# Patient Record
Sex: Female | Born: 1950 | Race: White | Hispanic: No | Marital: Married | Smoking: Former smoker
Health system: Southern US, Community
[De-identification: ages and names within clinical notes are randomized; demographics above are authoritative.]

## PROBLEM LIST (undated history)

## (undated) DIAGNOSIS — F419 Anxiety disorder, unspecified: Secondary | ICD-10-CM

## (undated) DIAGNOSIS — M1990 Unspecified osteoarthritis, unspecified site: Secondary | ICD-10-CM

## (undated) DIAGNOSIS — Z973 Presence of spectacles and contact lenses: Secondary | ICD-10-CM

## (undated) DIAGNOSIS — R0600 Dyspnea, unspecified: Secondary | ICD-10-CM

## (undated) DIAGNOSIS — I10 Essential (primary) hypertension: Secondary | ICD-10-CM

## (undated) DIAGNOSIS — K219 Gastro-esophageal reflux disease without esophagitis: Secondary | ICD-10-CM

## (undated) DIAGNOSIS — H409 Unspecified glaucoma: Secondary | ICD-10-CM

## (undated) DIAGNOSIS — J302 Other seasonal allergic rhinitis: Secondary | ICD-10-CM

## (undated) DIAGNOSIS — F329 Major depressive disorder, single episode, unspecified: Secondary | ICD-10-CM

## (undated) DIAGNOSIS — G4730 Sleep apnea, unspecified: Secondary | ICD-10-CM

## (undated) DIAGNOSIS — F32A Depression, unspecified: Secondary | ICD-10-CM

## (undated) DIAGNOSIS — J40 Bronchitis, not specified as acute or chronic: Secondary | ICD-10-CM

## (undated) DIAGNOSIS — R001 Bradycardia, unspecified: Secondary | ICD-10-CM

## (undated) DIAGNOSIS — I4891 Unspecified atrial fibrillation: Secondary | ICD-10-CM

## (undated) HISTORY — PX: COLONOSCOPY: SHX174

## (undated) HISTORY — PX: OTHER SURGICAL HISTORY: SHX169

## (undated) HISTORY — PX: BREAST BIOPSY: SHX20

## (undated) HISTORY — PX: TYMPANOSTOMY TUBE PLACEMENT: SHX32

---

## 1973-03-02 HISTORY — PX: NASAL SINUS SURGERY: SHX719

## 1988-03-02 HISTORY — PX: ABDOMINAL HYSTERECTOMY: SHX81

## 1998-07-02 ENCOUNTER — Other Ambulatory Visit: Admission: RE | Admit: 1998-07-02 | Discharge: 1998-07-02 | Payer: Self-pay | Admitting: Gynecology

## 1999-08-20 ENCOUNTER — Other Ambulatory Visit: Admission: RE | Admit: 1999-08-20 | Discharge: 1999-08-20 | Payer: Self-pay | Admitting: Gynecology

## 2000-09-21 ENCOUNTER — Other Ambulatory Visit: Admission: RE | Admit: 2000-09-21 | Discharge: 2000-09-21 | Payer: Self-pay | Admitting: Gynecology

## 2001-09-13 ENCOUNTER — Other Ambulatory Visit: Admission: RE | Admit: 2001-09-13 | Discharge: 2001-09-13 | Payer: Self-pay | Admitting: Gynecology

## 2002-09-25 ENCOUNTER — Other Ambulatory Visit: Admission: RE | Admit: 2002-09-25 | Discharge: 2002-09-25 | Payer: Self-pay | Admitting: Gynecology

## 2003-03-30 ENCOUNTER — Encounter: Admission: RE | Admit: 2003-03-30 | Discharge: 2003-03-30 | Payer: Self-pay | Admitting: Family Medicine

## 2003-10-10 ENCOUNTER — Other Ambulatory Visit: Admission: RE | Admit: 2003-10-10 | Discharge: 2003-10-10 | Payer: Self-pay | Admitting: Gynecology

## 2005-02-25 ENCOUNTER — Other Ambulatory Visit: Admission: RE | Admit: 2005-02-25 | Discharge: 2005-02-25 | Payer: Self-pay | Admitting: Gynecology

## 2006-03-01 ENCOUNTER — Other Ambulatory Visit: Admission: RE | Admit: 2006-03-01 | Discharge: 2006-03-01 | Payer: Self-pay | Admitting: Gynecology

## 2006-03-18 ENCOUNTER — Encounter: Admission: RE | Admit: 2006-03-18 | Discharge: 2006-03-18 | Payer: Self-pay | Admitting: Family Medicine

## 2007-02-21 ENCOUNTER — Other Ambulatory Visit: Admission: RE | Admit: 2007-02-21 | Discharge: 2007-02-21 | Payer: Self-pay | Admitting: Gynecology

## 2007-03-03 HISTORY — PX: ORIF ANKLE FRACTURE: SUR919

## 2007-05-20 ENCOUNTER — Inpatient Hospital Stay (HOSPITAL_COMMUNITY): Admission: EM | Admit: 2007-05-20 | Discharge: 2007-05-21 | Payer: Self-pay | Admitting: Emergency Medicine

## 2007-06-10 ENCOUNTER — Encounter: Admission: RE | Admit: 2007-06-10 | Discharge: 2007-06-10 | Payer: Self-pay | Admitting: Orthopedic Surgery

## 2007-06-23 ENCOUNTER — Encounter: Admission: RE | Admit: 2007-06-23 | Discharge: 2007-06-23 | Payer: Self-pay | Admitting: Orthopedic Surgery

## 2008-02-28 ENCOUNTER — Other Ambulatory Visit: Admission: RE | Admit: 2008-02-28 | Discharge: 2008-02-28 | Payer: Self-pay | Admitting: Gynecology

## 2010-07-15 NOTE — H&P (Signed)
NAME:  Michelle Johnston, Michelle Johnston NO.:  [id number]   MEDICAL RECORD NO.:  [id number]          PATIENT TYPE:  EMS   LOCATION:  ED                           FACILITY:  [facility]   PHYSICIAN:  Myrtie Neither, MD      DATE OF BIRTH:  08-22-1950   DATE OF ADMISSION:  05/20/2007  DATE OF DISCHARGE:                              HISTORY & PHYSICAL   CHIEF COMPLAINT:  Painful deformed left ankle.   HISTORY OF PRESENT ILLNESS:  This is a 60 year old female who was  painting on her front porch down on her knees and went to try to get up,  and her great toe got caught underneath an object causing her to fall.  The patient twisted the left ankle and developed severe pain and  deformity.  The patient came to [facility] Emergency Room for  treatment.  No history of any other injuries.   PAST MEDICAL HISTORY:  Glaucoma.  No history of high blood pressure or  diabetes.   ALLERGIES:  CEPHALOSPORINS.   MEDICATIONS:  1. Conjugated estrogens.  2. Betoptic.  3. Prozac.   FAMILY HISTORY:  Cancer and hypertension.   REVIEW OF SYSTEMS:  Basically, the patient has been in good health.   SOCIAL HISTORY:  No history of use of tobacco.  Occasional use of  alcohol   PHYSICAL EXAMINATION:  GENERAL:  Alert and oriented.  No acute distress.  VITAL SIGNS:  Temperature 97.8, pulse 63, respirations 16, blood  pressure 150/80.  HEENT:  Head is normocephalic.  Conjunctivae and sclerae are clear.  NECK:  Supple.  CHEST:  Clear.  CARDIAC:  S1 and S2 regular.  EXTREMITIES:  Left ankle grossly deformed.  Pulses, dorsalis pedis is  intact.  Sensory is intact.  Swollen both medially and laterally, and  tender.   DIAGNOSTICS:  X-ray revealed fracture/dislocation of the left ankle,  fractured lateral malleolus and avulsion of the deltoid, also posterior  lip fracture of the tibia as well.   IMPRESSION:  Fracture/dislocation of the left ankle with trimalleolar  fracture of the left ankle.   PLAN:  Open  reduction and internal fixation of the left ankle.      Myrtie Neither, MD  Electronically Signed     AC/MEDQ  D:  05/20/2007  T:  05/21/2007  Job:  [id number]

## 2010-07-15 NOTE — Op Note (Signed)
NAME:  GRISELLE, RUFER NO.:  [id number]   MEDICAL RECORD NO.:  [id number]          PATIENT TYPE:  INP   LOCATION:  0098                         FACILITY:  [facility]   PHYSICIAN:  Myrtie Neither, MD      DATE OF BIRTH:  11/15/1950   DATE OF PROCEDURE:  05/20/2007  DATE OF DISCHARGE:                               OPERATIVE REPORT   PREOPERATIVE DIAGNOSIS:  Fracture dislocation left ankle with fractured  lateral malleolus, avulsion on the medial malleolus and posterior  plafond fracture.   POSTOPERATIVE DIAGNOSIS:  Fracture dislocation left ankle with fractured  lateral malleolus, avulsion on the medial malleolus and posterior  plafond fracture, also loose intra-articular fragment.   ANESTHESIA:  General.   SURGEON:  Myrtie Neither, MD   PROCEDURE:  1. Open reduction and internal fixation lateral malleolus.  2. Removal of loose fragment and application of short leg cast.   PROCEDURE IN DETAIL:  The patient was taken to the operating room and  after given adequate preop medications given general anesthesia and  intubated.  The left ankle was gently manipulated and reduced.  The left  lower leg was then prepped with DuraPrep and draped in a sterile manner.  Tourniquet used for hemostasis.  Mini C-arm was used to visualize  fracture reduction.  Lateral incision made over the lateral malleolus  going through the skin and subcutaneous tissue.  Subperiosteal elevation  of soft tissue from the fracture site was done.  Inspection of the ankle  joint revealed loose fragment underneath the tibial plafond which was  too small to replace.  Loose fragment was removed.  Irrigation was done.  Manipulated reduction of the lateral malleolar fracture was done, placed  in anatomic position and then stabilized with a 6 hole compression plate  using 5 screws.  This was visualized with use of mini C-arm.  Posterior  plafond fragment was very minimal and medial malleolus was only an  avulsion.  Irrigation was done.  Wound closure was then done with 2-0  Vicryl and skin staples.  Compressive dressing was applied.  Short leg  fiberglass cast was applied.  The patient tolerated the procedure quite  well and went to the recovery room in stable and satisfactory condition.      Myrtie Neither, MD  Electronically Signed     AC/MEDQ  D:  05/20/2007  T:  05/21/2007  Job:  [id number]

## 2010-07-30 ENCOUNTER — Other Ambulatory Visit: Payer: Self-pay | Admitting: Orthopedic Surgery

## 2010-07-30 ENCOUNTER — Ambulatory Visit
Admission: RE | Admit: 2010-07-30 | Discharge: 2010-07-30 | Disposition: A | Discharge status: Home / Self Care | Payer: 59 | Source: Ambulatory Visit | Attending: Orthopedic Surgery | Admitting: Orthopedic Surgery

## 2010-07-30 DIAGNOSIS — M25562 Pain in left knee: Secondary | ICD-10-CM

## 2010-08-25 ENCOUNTER — Ambulatory Visit: Payer: 59

## 2010-09-02 ENCOUNTER — Ambulatory Visit: Payer: 59 | Attending: Orthopedic Surgery

## 2010-09-02 DIAGNOSIS — IMO0001 Reserved for inherently not codable concepts without codable children: Secondary | ICD-10-CM | POA: Insufficient documentation

## 2010-09-02 DIAGNOSIS — R5381 Other malaise: Secondary | ICD-10-CM | POA: Insufficient documentation

## 2010-09-02 DIAGNOSIS — M25569 Pain in unspecified knee: Secondary | ICD-10-CM | POA: Insufficient documentation

## 2010-09-04 ENCOUNTER — Ambulatory Visit: Payer: 59

## 2010-09-08 ENCOUNTER — Ambulatory Visit: Payer: 59

## 2010-09-17 ENCOUNTER — Encounter: Payer: 59 | Admitting: Physical Therapy

## 2010-09-24 ENCOUNTER — Encounter: Payer: 59 | Admitting: Physical Therapy

## 2010-09-29 ENCOUNTER — Encounter: Payer: 59 | Admitting: Physical Therapy

## 2010-10-01 ENCOUNTER — Encounter: Payer: 59 | Admitting: Physical Therapy

## 2010-11-24 LAB — SAMPLE TO BLOOD BANK

## 2010-11-24 LAB — BASIC METABOLIC PANEL
BUN: 19
CO2: 24
Chloride: 96
Glucose, Bld: 97
Potassium: 3.4 — ABNORMAL LOW
Sodium: 130 — ABNORMAL LOW

## 2010-11-24 LAB — DIFFERENTIAL
Basophils Absolute: 0
Basophils Relative: 0
Eosinophils Absolute: 0
Eosinophils Relative: 1
Lymphocytes Relative: 24
Monocytes Absolute: 0.3

## 2010-11-24 LAB — PROTIME-INR: Prothrombin Time: 13

## 2010-11-24 LAB — CBC
HCT: 41
Hemoglobin: 14.3
MCHC: 34.9
MCV: 96.5
Platelets: 228
RDW: 13.1

## 2011-12-14 ENCOUNTER — Emergency Department (HOSPITAL_COMMUNITY)
Admission: EM | Admit: 2011-12-14 | Discharge: 2011-12-15 | Disposition: A | Discharge status: Home / Self Care | Payer: Managed Care, Other (non HMO) | Attending: Emergency Medicine | Admitting: Emergency Medicine

## 2011-12-14 ENCOUNTER — Encounter (HOSPITAL_COMMUNITY): Payer: Self-pay | Admitting: Emergency Medicine

## 2011-12-14 ENCOUNTER — Emergency Department (HOSPITAL_COMMUNITY): Payer: Managed Care, Other (non HMO)

## 2011-12-14 DIAGNOSIS — R109 Unspecified abdominal pain: Secondary | ICD-10-CM | POA: Insufficient documentation

## 2011-12-14 DIAGNOSIS — Z90710 Acquired absence of both cervix and uterus: Secondary | ICD-10-CM | POA: Insufficient documentation

## 2011-12-14 DIAGNOSIS — R10815 Periumbilic abdominal tenderness: Secondary | ICD-10-CM | POA: Insufficient documentation

## 2011-12-14 DIAGNOSIS — R110 Nausea: Secondary | ICD-10-CM | POA: Insufficient documentation

## 2011-12-14 HISTORY — DX: Depression, unspecified: F32.A

## 2011-12-14 HISTORY — DX: Major depressive disorder, single episode, unspecified: F32.9

## 2011-12-14 LAB — CBC WITH DIFFERENTIAL/PLATELET
Eosinophils Absolute: 0 10*3/uL (ref 0.0–0.7)
HCT: 36.8 % (ref 36.0–46.0)
Hemoglobin: 13.1 g/dL (ref 12.0–15.0)
Lymphs Abs: 1.1 10*3/uL (ref 0.7–4.0)
MCH: 33.9 pg (ref 26.0–34.0)
Monocytes Absolute: 0.4 10*3/uL (ref 0.1–1.0)
Monocytes Relative: 5 % (ref 3–12)
Neutrophils Relative %: 80 % — ABNORMAL HIGH (ref 43–77)
RBC: 3.87 MIL/uL (ref 3.87–5.11)

## 2011-12-14 LAB — COMPREHENSIVE METABOLIC PANEL
Alkaline Phosphatase: 52 U/L (ref 39–117)
BUN: 13 mg/dL (ref 6–23)
Chloride: 97 mEq/L (ref 96–112)
Creatinine, Ser: 0.58 mg/dL (ref 0.50–1.10)
GFR calc Af Amer: >90 mL/min (ref >90)
Glucose, Bld: 106 mg/dL — ABNORMAL HIGH (ref 70–99)
Potassium: 3.8 mEq/L (ref 3.5–5.1)
Total Bilirubin: 0.4 mg/dL (ref 0.3–1.2)
Total Protein: 6.7 g/dL (ref 6.0–8.3)

## 2011-12-14 LAB — URINALYSIS, MICROSCOPIC ONLY
Bilirubin Urine: NEGATIVE
Nitrite: NEGATIVE
Urobilinogen, UA: 0.2 mg/dL (ref 0.0–1.0)

## 2011-12-14 LAB — LIPASE, BLOOD: Lipase: 14 U/L (ref 11–59)

## 2011-12-14 MED ORDER — HYDROMORPHONE HCL PF 1 MG/ML IJ SOLN
1.0000 mg | Freq: Once | INTRAMUSCULAR | Status: AC
  Administered 2011-12-14: 1 mg via INTRAVENOUS
  Filled 2011-12-14: qty 1

## 2011-12-14 MED ORDER — CIPROFLOXACIN IN D5W 400 MG/200ML IV SOLN
400.0000 mg | Freq: Once | INTRAVENOUS | Status: AC
  Administered 2011-12-14: 400 mg via INTRAVENOUS
  Filled 2011-12-14: qty 200

## 2011-12-14 MED ORDER — OXYCODONE-ACETAMINOPHEN 5-325 MG PO TABS: 1.0000 | ORAL_TABLET | Freq: Four times a day (QID) | ORAL | Status: DC | PRN

## 2011-12-14 MED ORDER — ONDANSETRON HCL 4 MG/2ML IJ SOLN: 4.0000 mg | Freq: Once | INTRAMUSCULAR | Status: DC

## 2011-12-14 MED ORDER — ONDANSETRON HCL 4 MG PO TABS: 4.0000 mg | ORAL_TABLET | Freq: Four times a day (QID) | ORAL | Status: DC

## 2011-12-14 MED ORDER — FENTANYL CITRATE 0.05 MG/ML IJ SOLN
50.0000 ug | Freq: Once | INTRAMUSCULAR | Status: AC
  Administered 2011-12-14: 50 ug via INTRAVENOUS
  Filled 2011-12-14: qty 2

## 2011-12-14 MED ORDER — SODIUM CHLORIDE 0.9 % IV SOLN
1000.0000 mL | Freq: Once | INTRAVENOUS | Status: AC
  Administered 2011-12-14: 1000 mL via INTRAVENOUS

## 2011-12-14 MED ORDER — METRONIDAZOLE 500 MG PO TABS: 500.0000 mg | ORAL_TABLET | Freq: Two times a day (BID) | ORAL | Status: DC

## 2011-12-14 MED ORDER — IOHEXOL 300 MG/ML  SOLN
100.0000 mL | Freq: Once | INTRAMUSCULAR | Status: AC | PRN
  Administered 2011-12-14: 100 mL via INTRAVENOUS

## 2011-12-14 MED ORDER — ONDANSETRON HCL 4 MG/2ML IJ SOLN
4.0000 mg | Freq: Once | INTRAMUSCULAR | Status: AC
  Administered 2011-12-14: 4 mg via INTRAVENOUS
  Filled 2011-12-14: qty 2

## 2011-12-14 MED ORDER — CIPROFLOXACIN HCL 500 MG PO TABS
500.0000 mg | ORAL_TABLET | Freq: Two times a day (BID) | ORAL | Status: DC
Start: 2011-12-14 — End: 2011-12-16

## 2011-12-14 MED ORDER — ONDANSETRON HCL 4 MG/2ML IJ SOLN
INTRAMUSCULAR | Status: AC
  Filled 2011-12-14: qty 4

## 2011-12-14 MED ORDER — METRONIDAZOLE IN NACL 5-0.79 MG/ML-% IV SOLN
500.0000 mg | Freq: Once | INTRAVENOUS | Status: AC
  Administered 2011-12-14: 500 mg via INTRAVENOUS
  Filled 2011-12-14: qty 100

## 2011-12-14 NOTE — ED Notes (Signed)
Pt received zofran 8mg  via ems. Ems given back 8mg  of zofran

## 2011-12-14 NOTE — ED Provider Notes (Signed)
History     CSN: [id number]  Arrival date & time 12/14/11  4098   First MD Initiated Contact with Patient 12/14/11 0857      Chief Complaint  Patient presents with  . Abdominal Pain  . Nausea    (Consider location/radiation/quality/duration/timing/severity/associated sxs/prior treatment) HPI Pt presents with c/o abdominal pain as well as nausea, no vomiting.  Pain began yesterday and is located in her mid abdomen and upper abdomen.  No diarrhea.  No fever/chills.  Pain is moderate.  Denies dysuria/frequency/urgency.  She has not had any treatment prior to arrival.  There are no other associated systemic symptoms, there are no other alleviating or modifying factors.   Past Medical History  Diagnosis Date  . Depression   . Bradycardia   . Glaucoma     Past Surgical History  Procedure Date  . Abdominal hysterectomy   . Nasal sinus surgery     No family history on file.  History  Substance Use Topics  . Smoking status: Never Smoker   . Smokeless tobacco: Not on file  . Alcohol Use: Yes     daily    OB History    Grav Para Term Preterm Abortions TAB SAB Ect Mult Living                  Review of Systems ROS reviewed and all otherwise negative except for mentioned in HPI  Allergies  Ceclor  Home Medications   Current Outpatient Rx  Name Route Sig Dispense Refill  . ACETAMINOPHEN 500 MG PO TABS Oral Take 1,000 mg by mouth every 6 (six) hours as needed. pain    . ESTRADIOL 0.075 MG/24HR TD PTTW Transdermal Place 1 patch onto the skin 2 (two) times a week.    Marland Kitchen FLUOXETINE HCL 40 MG PO CAPS Oral Take 40 mg by mouth daily.    Marland Kitchen TIMOLOL HEMIHYDRATE 0.25 % OP SOLN Both Eyes Place 1-2 drops into both eyes 2 (two) times daily.    Marland Kitchen CIPROFLOXACIN HCL 500 MG PO TABS Oral Take 500 mg by mouth every 12 (twelve) hours.    Marland Kitchen METRONIDAZOLE 500 MG PO TABS Oral Take 500 mg by mouth 2 (two) times daily.    Marland Kitchen ONDANSETRON HCL 4 MG PO TABS Oral Take 1 tablet (4 mg total) by mouth  every 6 (six) hours. 12 tablet 0  . OXYCODONE-ACETAMINOPHEN 5-325 MG PO TABS Oral Take 1-2 tablets by mouth every 6 (six) hours as needed for pain. 15 tablet 0    BP 118/61  Pulse 59  Temp 98.3 F (36.8 C) (Oral)  Resp 16  SpO2 100% Vitals reviewed Physical Exam Physical Examination: General appearance - alert, well appearing, and in no distress Mental status - alert, oriented to person, place, and time Eyes - no scleral icterus, no conjunctival injection Mouth - mucous membranes moist, pharynx normal without lesions Chest - clear to auscultation, no wheezes, rales or rhonchi, symmetric air entry Heart - normal rate, regular rhythm, normal S1, S2, no murmurs, rubs, clicks or gallops Abdomen - soft, mild ttp in periumbilical region, no gaurding or rebound tenderness, nondistended, no masses or organomegaly, nabs Extremities - peripheral pulses normal, no pedal edema, no clubbing or cyanosis Skin - normal coloration and turgor, no rashes, brisk cap refill  ED Course  Procedures (including critical care time)  Labs Reviewed  CBC WITH DIFFERENTIAL - Abnormal; Notable for the following:    Neutrophils Relative 80 (*)     All other  components within normal limits  COMPREHENSIVE METABOLIC PANEL - Abnormal; Notable for the following:    Sodium 133 (*)     Glucose, Bld 106 (*)     All other components within normal limits  URINALYSIS, MICROSCOPIC ONLY - Abnormal; Notable for the following:    Ketones, ur TRACE (*)     Bacteria, UA FEW (*)     Squamous Epithelial / LPF FEW (*)     All other components within normal limits  LIPASE, BLOOD  LAB REPORT - SCANNED   No results found.   1. Abdominal pain       MDM  Pt presenting with abdominal pain and nausea. Workup including labs, and ultrasound are reassuring with the exception of mild hyponatremia, pt treated with IV hydration, IV pain and nausea meds.  Ultrasound was reassuring, but pain continued, therefore CT scan obtained.   This showed mild wall thickening of a single loop of bowel ? Ileitis.  Pt started on iv cipro and flagyl.  Tolerated po challenge prior to discharge.  Discharged with strict return precautions.  Pt agreeable with plan.        Ethelda Chick, MD 12/16/11 [id number]

## 2011-12-14 NOTE — ED Notes (Signed)
MWU:[id number]<MW> Expected date:<BR> Expected time:<BR> Means of arrival:<BR> Comments:<BR> Abdominal pain/fever

## 2011-12-14 NOTE — ED Notes (Signed)
Pt presenting to ed with c/o abdominal pain with positive nausea no vomiting no diarrhea

## 2011-12-16 ENCOUNTER — Emergency Department (HOSPITAL_COMMUNITY)
Admission: EM | Admit: 2011-12-16 | Discharge: 2011-12-16 | Disposition: A | Discharge status: Home / Self Care | Payer: Managed Care, Other (non HMO) | Attending: Emergency Medicine | Admitting: Emergency Medicine

## 2011-12-16 ENCOUNTER — Encounter (HOSPITAL_COMMUNITY): Payer: Self-pay

## 2011-12-16 DIAGNOSIS — B9689 Other specified bacterial agents as the cause of diseases classified elsewhere: Secondary | ICD-10-CM | POA: Insufficient documentation

## 2011-12-16 DIAGNOSIS — K5289 Other specified noninfective gastroenteritis and colitis: Secondary | ICD-10-CM | POA: Insufficient documentation

## 2011-12-16 DIAGNOSIS — A499 Bacterial infection, unspecified: Secondary | ICD-10-CM | POA: Insufficient documentation

## 2011-12-16 DIAGNOSIS — M542 Cervicalgia: Secondary | ICD-10-CM | POA: Insufficient documentation

## 2011-12-16 DIAGNOSIS — N390 Urinary tract infection, site not specified: Secondary | ICD-10-CM | POA: Insufficient documentation

## 2011-12-16 DIAGNOSIS — H409 Unspecified glaucoma: Secondary | ICD-10-CM | POA: Insufficient documentation

## 2011-12-16 DIAGNOSIS — Z79899 Other long term (current) drug therapy: Secondary | ICD-10-CM | POA: Insufficient documentation

## 2011-12-16 DIAGNOSIS — N760 Acute vaginitis: Secondary | ICD-10-CM | POA: Insufficient documentation

## 2011-12-16 HISTORY — DX: Unspecified glaucoma: H40.9

## 2011-12-16 HISTORY — DX: Bradycardia, unspecified: R00.1

## 2011-12-16 LAB — CBC WITH DIFFERENTIAL/PLATELET
Eosinophils Relative: 2 % (ref 0–5)
HCT: 35.4 % — ABNORMAL LOW (ref 36.0–46.0)
Lymphocytes Relative: 29 % (ref 12–46)
Lymphs Abs: 1.2 10*3/uL (ref 0.7–4.0)
MCH: 34.1 pg — ABNORMAL HIGH (ref 26.0–34.0)
MCV: 97.3 fL (ref 78.0–100.0)
Monocytes Absolute: 0.4 10*3/uL (ref 0.1–1.0)
RBC: 3.64 MIL/uL — ABNORMAL LOW (ref 3.87–5.11)
RDW: 11.9 % (ref 11.5–15.5)
WBC: 4.1 10*3/uL (ref 4.0–10.5)

## 2011-12-16 LAB — URINALYSIS, ROUTINE W REFLEX MICROSCOPIC
Leukocytes, UA: NEGATIVE
Protein, ur: NEGATIVE mg/dL
Urobilinogen, UA: 0.2 mg/dL (ref 0.0–1.0)

## 2011-12-16 LAB — WET PREP, GENITAL: WBC, Wet Prep HPF POC: NONE SEEN

## 2011-12-16 LAB — LIPASE, BLOOD: Lipase: 13 U/L (ref 11–59)

## 2011-12-16 LAB — COMPREHENSIVE METABOLIC PANEL
BUN: 9 mg/dL (ref 6–23)
CO2: 26 mEq/L (ref 19–32)
Calcium: 8.7 mg/dL (ref 8.4–10.5)
Creatinine, Ser: 0.69 mg/dL (ref 0.50–1.10)
GFR calc Af Amer: >90 mL/min (ref >90)
GFR calc non Af Amer: >90 mL/min (ref >90)
Glucose, Bld: 95 mg/dL (ref 70–99)

## 2011-12-16 LAB — URINE MICROSCOPIC-ADD ON

## 2011-12-16 MED ORDER — SODIUM CHLORIDE 0.9 % IV BOLUS (SEPSIS)
1000.0000 mL | Freq: Once | INTRAVENOUS | Status: AC
  Administered 2011-12-16: 1000 mL via INTRAVENOUS

## 2011-12-16 MED ORDER — MORPHINE SULFATE 4 MG/ML IJ SOLN
4.0000 mg | Freq: Once | INTRAMUSCULAR | Status: AC
  Administered 2011-12-16: 4 mg via INTRAVENOUS
  Filled 2011-12-16: qty 1

## 2011-12-16 NOTE — ED Notes (Signed)
Pt states that she is having mid abdominal pain 5/10 since Monday morning.  Came here on Monday and was given abx for an "inflamed bowel".  Pt states she is pooping out of her vagina.  When asked how she knows that it is from her vagina, she stated "because I peed".  Pt says that brown/yellow diarrhea is coming out of her vagina.

## 2011-12-16 NOTE — ED Provider Notes (Signed)
Medical screening examination/treatment/procedure(s) were conducted as a shared visit with non-physician practitioner(s) and myself.  I personally evaluated the patient during the encounter  Michelle Johnston is a 61 y.o. female here with abdominal pain and stool in her vagina. She had diarrhea in AM and noticed stool around the vaginal area when she wiped back to front. No recent GYN or rectal surgery. Her labs are nl, I performed a GYN exam and saw no rectal vaginal fistula. She has a mild UTI probably from wiping incorrectly. I recommend wiping back to front and continue cipro and flagyl for the ileitis (which will treat UTI and possible BV on vaginal exam).    Richardean Canal, MD 12/16/11 1240

## 2011-12-16 NOTE — ED Provider Notes (Signed)
History     CSN: [id number]  Arrival date & time 12/16/11  4098   First MD Initiated Contact with Patient 12/16/11 1002      Chief Complaint  Patient presents with  . Abdominal Pain    seen here for Monday for presenting complaint    (Consider location/radiation/quality/duration/timing/severity/associated sxs/prior treatment) HPI Comments: This is a 61 year old female, who presents to the ED with a chief complaint of abdominal pain and "pooping out of my vagina."  The patient states that she has had abdominal pain for the past 2 days, and that she was seen here on 12/14/11 and diagnosed with ilieitis.  She was discharged on cipro/flagyl, which she has been taking regularly.  She states that the pain has not let up, and that last night she noticed that her urine was dark.  This morning she awoke to having fecal content in her underwear.  She reports that when she went to bathroom this morning, she had "poop" coming from her vagina.  Patient had TAH in 1999 for uterine fibroids and bleeding.  Patient reports no complications.  The history is provided by the patient. No language interpreter was used.    Past Medical History  Diagnosis Date  . Depression   . Bradycardia   . Glaucoma     Past Surgical History  Procedure Date  . Abdominal hysterectomy   . Nasal sinus surgery     No family history on file.  History  Substance Use Topics  . Smoking status: Never Smoker   . Smokeless tobacco: Not on file  . Alcohol Use: Yes     daily    OB History    Grav Para Term Preterm Abortions TAB SAB Ect Mult Living                  Review of Systems  Constitutional: Positive for chills. Negative for fever.  HENT: Positive for neck pain.   Eyes: Negative for visual disturbance.  Respiratory: Negative for chest tightness and shortness of breath.   Cardiovascular: Negative for chest pain.  Gastrointestinal: Positive for abdominal pain.       Fecal material from vagina    Genitourinary: Negative for dysuria and vaginal discharge.  Musculoskeletal: Negative for back pain.  Skin: Negative for rash.  Neurological: Negative for weakness.  Psychiatric/Behavioral: The patient is not nervous/anxious.   All other systems reviewed and are negative.    Allergies  Ceclor  Home Medications   Current Outpatient Rx  Name Route Sig Dispense Refill  . ACETAMINOPHEN 500 MG PO TABS Oral Take 1,000 mg by mouth every 6 (six) hours as needed. pain    . CIPROFLOXACIN HCL 500 MG PO TABS Oral Take 500 mg by mouth every 12 (twelve) hours.    . ESTRADIOL 0.075 MG/24HR TD PTTW Transdermal Place 1 patch onto the skin 2 (two) times a week.    Marland Kitchen FLUOXETINE HCL 40 MG PO CAPS Oral Take 40 mg by mouth daily.    Marland Kitchen ONDANSETRON HCL 4 MG PO TABS Oral Take 1 tablet (4 mg total) by mouth every 6 (six) hours. 12 tablet 0  . OXYCODONE-ACETAMINOPHEN 5-325 MG PO TABS Oral Take 1-2 tablets by mouth every 6 (six) hours as needed for pain. 15 tablet 0  . TIMOLOL HEMIHYDRATE 0.25 % OP SOLN Both Eyes Place 1-2 drops into both eyes 2 (two) times daily.    Marland Kitchen METRONIDAZOLE 500 MG PO TABS Oral Take 500 mg by mouth 2 (  two) times daily.      BP 142/88  Pulse 58  Temp 97.9 F (36.6 C) (Oral)  Resp 18  Ht 5\' 5"  (1.651 m)  Wt 160 lb (72.576 kg)  BMI 26.63 kg/m2  SpO2 100%  Physical Exam  Nursing note and vitals reviewed. Constitutional: She is oriented to person, place, and time. She appears well-developed and well-nourished.  HENT:  Head: Normocephalic and atraumatic.  Eyes: Conjunctivae normal and EOM are normal. Pupils are equal, round, and reactive to light.  Neck: Normal range of motion. Neck supple.  Cardiovascular: Normal rate, regular rhythm and normal heart sounds.   Pulmonary/Chest: Effort normal and breath sounds normal.  Abdominal: Soft. She exhibits no distension and no mass. There is tenderness. There is no rebound and no guarding.       Hyperactive bowel sounds  Genitourinary:  Rectal exam shows no external hemorrhoid, no internal hemorrhoid, no fissure, no mass, no tenderness and anal tone normal. Guaiac negative stool. Pelvic exam was performed with patient supine. There is no rash, tenderness, lesion or injury on the right labia. There is no rash, tenderness, lesion or injury on the left labia. Right adnexum displays no mass, no tenderness and no fullness. Left adnexum displays no mass, no tenderness and no fullness. No erythema, tenderness or bleeding around the vagina. No foreign body around the vagina. No signs of injury around the vagina. Vaginal discharge found.  Neurological: She is alert and oriented to person, place, and time.  Skin: Skin is warm and dry.  Psychiatric: She has a normal mood and affect. Her behavior is normal. Judgment and thought content normal.    ED Course  Procedures (including critical care time)  Labs Reviewed  URINALYSIS, ROUTINE W REFLEX MICROSCOPIC - Abnormal; Notable for the following:    APPearance CLOUDY (*)     Hgb urine dipstick SMALL (*)     All other components within normal limits  URINE MICROSCOPIC-ADD ON - Abnormal; Notable for the following:    Squamous Epithelial / LPF MANY (*)     Bacteria, UA MANY (*)     All other components within normal limits  CBC WITH DIFFERENTIAL  COMPREHENSIVE METABOLIC PANEL  LIPASE, BLOOD  OCCULT BLOOD X 1 CARD TO LAB, STOOL   Results for orders placed during the hospital encounter of 12/16/11  CBC WITH DIFFERENTIAL      Component Value Range   WBC 4.1  4.0 - 10.5 K/uL   RBC 3.64 (*) 3.87 - 5.11 MIL/uL   Hemoglobin 12.4  12.0 - 15.0 g/dL   HCT 16.1 (*) 09.6 - 04.5 %   MCV 97.3  78.0 - 100.0 fL   MCH 34.1 (*) 26.0 - 34.0 pg   MCHC 35.0  30.0 - 36.0 g/dL   RDW 40.9  81.1 - 91.4 %   Platelets 268  150 - 400 K/uL   Neutrophils Relative 60  43 - 77 %   Neutro Abs 2.4  1.7 - 7.7 K/uL   Lymphocytes Relative 29  12 - 46 %   Lymphs Abs 1.2  0.7 - 4.0 K/uL   Monocytes Relative 9  3 - 12  %   Monocytes Absolute 0.4  0.1 - 1.0 K/uL   Eosinophils Relative 2  0 - 5 %   Eosinophils Absolute 0.1  0.0 - 0.7 K/uL   Basophils Relative 1  0 - 1 %   Basophils Absolute 0.0  0.0 - 0.1 K/uL  COMPREHENSIVE METABOLIC PANEL  Component Value Range   Sodium 133 (*) 135 - 145 mEq/L   Potassium 3.9  3.5 - 5.1 mEq/L   Chloride 98  96 - 112 mEq/L   CO2 26  19 - 32 mEq/L   Glucose, Bld 95  70 - 99 mg/dL   BUN 9  6 - 23 mg/dL   Creatinine, Ser 1.61  0.50 - 1.10 mg/dL   Calcium 8.7  8.4 - 09.6 mg/dL   Total Protein 6.0  6.0 - 8.3 g/dL   Albumin 3.4 (*) 3.5 - 5.2 g/dL   AST 35  0 - 37 U/L   ALT 19  0 - 35 U/L   Alkaline Phosphatase 48  39 - 117 U/L   Total Bilirubin 0.6  0.3 - 1.2 mg/dL   GFR calc non Af Amer >90  >90 mL/min   GFR calc Af Amer >90  >90 mL/min  LIPASE, BLOOD      Component Value Range   Lipase 13  11 - 59 U/L  URINALYSIS, ROUTINE W REFLEX MICROSCOPIC      Component Value Range   Color, Urine YELLOW  YELLOW   APPearance CLOUDY (*) CLEAR   Specific Gravity, Urine 1.013  1.005 - 1.030   pH 7.0  5.0 - 8.0   Glucose, UA NEGATIVE  NEGATIVE mg/dL   Hgb urine dipstick SMALL (*) NEGATIVE   Bilirubin Urine NEGATIVE  NEGATIVE   Ketones, ur NEGATIVE  NEGATIVE mg/dL   Protein, ur NEGATIVE  NEGATIVE mg/dL   Urobilinogen, UA 0.2  0.0 - 1.0 mg/dL   Nitrite NEGATIVE  NEGATIVE   Leukocytes, UA NEGATIVE  NEGATIVE  URINE MICROSCOPIC-ADD ON      Component Value Range   Squamous Epithelial / LPF MANY (*) RARE   RBC / HPF 0-2  <3 RBC/hpf   Bacteria, UA MANY (*) RARE  WET PREP, GENITAL      Component Value Range   Yeast Wet Prep HPF POC NONE SEEN  NONE SEEN   Trich, Wet Prep NONE SEEN  NONE SEEN   Clue Cells Wet Prep HPF POC RARE (*) NONE SEEN   WBC, Wet Prep HPF POC NONE SEEN  NONE SEEN  OCCULT BLOOD, POC DEVICE      Component Value Range   Fecal Occult Bld NEGATIVE           1. 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  10:57 AM This patient has been seen by and discussed with Dr. Silverio Lay.  Dr. Silverio Lay tells me that it is more likely that the fecal material is due to improper wiping.  The patient states that she wipes back-to-front.  Dr. Silverio Lay says that he will also perform a pelvic and rectal exam.  12:14 PM Dr. Silverio Lay tells me that he saw no sign of fistula on pelvic and rectal exam.  He tells me to discharge the patient with PCP follow-up.  The patient is to continue her treatment for the ileitis.  The patient is stable and ready for discharge.          Roxy Horseman, PA-C 12/16/11 1216

## 2011-12-16 NOTE — ED Notes (Signed)
Pt present with severe stomach pain- seen and treated here Monday for presenting complaint- Pt reports "Pooping out of my vagina"

## 2011-12-17 LAB — GC/CHLAMYDIA PROBE AMP, GENITAL: Chlamydia, DNA Probe: NEGATIVE

## 2012-11-03 ENCOUNTER — Other Ambulatory Visit: Payer: Self-pay | Admitting: Orthopedic Surgery

## 2012-11-03 ENCOUNTER — Ambulatory Visit
Admission: RE | Admit: 2012-11-03 | Discharge: 2012-11-03 | Disposition: A | Discharge status: Home / Self Care | Payer: BC Managed Care – PPO | Source: Ambulatory Visit | Attending: Orthopedic Surgery | Admitting: Orthopedic Surgery

## 2012-11-03 DIAGNOSIS — M25562 Pain in left knee: Secondary | ICD-10-CM

## 2013-04-26 ENCOUNTER — Other Ambulatory Visit: Payer: Self-pay | Admitting: Orthopedic Surgery

## 2013-04-26 DIAGNOSIS — M25462 Effusion, left knee: Secondary | ICD-10-CM

## 2013-04-26 DIAGNOSIS — M25562 Pain in left knee: Secondary | ICD-10-CM

## 2013-05-05 ENCOUNTER — Ambulatory Visit
Admission: RE | Admit: 2013-05-05 | Discharge: 2013-05-05 | Disposition: A | Discharge status: Home / Self Care | Payer: Commercial Managed Care - HMO | Source: Ambulatory Visit | Attending: Orthopedic Surgery | Admitting: Orthopedic Surgery

## 2013-05-05 DIAGNOSIS — M25462 Effusion, left knee: Secondary | ICD-10-CM

## 2013-05-05 DIAGNOSIS — M25562 Pain in left knee: Secondary | ICD-10-CM

## 2013-11-10 ENCOUNTER — Other Ambulatory Visit: Payer: Self-pay | Admitting: Gynecology

## 2013-11-10 DIAGNOSIS — R928 Other abnormal and inconclusive findings on diagnostic imaging of breast: Secondary | ICD-10-CM

## 2013-11-13 ENCOUNTER — Other Ambulatory Visit: Payer: Self-pay | Admitting: Gynecology

## 2013-11-13 DIAGNOSIS — R928 Other abnormal and inconclusive findings on diagnostic imaging of breast: Secondary | ICD-10-CM

## 2013-11-21 ENCOUNTER — Ambulatory Visit
Admission: RE | Admit: 2013-11-21 | Discharge: 2013-11-21 | Disposition: A | Discharge status: Home / Self Care | Payer: Commercial Managed Care - HMO | Source: Ambulatory Visit | Attending: Gynecology | Admitting: Gynecology

## 2013-11-21 ENCOUNTER — Encounter (INDEPENDENT_AMBULATORY_CARE_PROVIDER_SITE_OTHER): Payer: Self-pay

## 2013-11-21 DIAGNOSIS — R928 Other abnormal and inconclusive findings on diagnostic imaging of breast: Secondary | ICD-10-CM

## 2014-01-10 ENCOUNTER — Ambulatory Visit
Admission: RE | Admit: 2014-01-10 | Discharge: 2014-01-10 | Disposition: A | Discharge status: Home / Self Care | Payer: Commercial Managed Care - HMO | Source: Ambulatory Visit | Attending: Family Medicine | Admitting: Family Medicine

## 2014-01-10 ENCOUNTER — Other Ambulatory Visit: Payer: Self-pay | Admitting: Family Medicine

## 2014-01-10 DIAGNOSIS — R05 Cough: Secondary | ICD-10-CM

## 2014-01-10 DIAGNOSIS — R059 Cough, unspecified: Secondary | ICD-10-CM

## 2014-03-05 ENCOUNTER — Other Ambulatory Visit: Payer: Self-pay | Admitting: Family Medicine

## 2014-03-05 DIAGNOSIS — E780 Pure hypercholesterolemia: Secondary | ICD-10-CM | POA: Diagnosis not present

## 2014-03-05 DIAGNOSIS — J329 Chronic sinusitis, unspecified: Secondary | ICD-10-CM

## 2014-03-05 DIAGNOSIS — J45909 Unspecified asthma, uncomplicated: Secondary | ICD-10-CM | POA: Diagnosis not present

## 2014-03-09 ENCOUNTER — Ambulatory Visit
Admission: RE | Admit: 2014-03-09 | Discharge: 2014-03-09 | Disposition: A | Discharge status: Home / Self Care | Payer: Commercial Managed Care - HMO | Source: Ambulatory Visit | Attending: Family Medicine | Admitting: Family Medicine

## 2014-03-09 DIAGNOSIS — J3489 Other specified disorders of nose and nasal sinuses: Secondary | ICD-10-CM | POA: Diagnosis not present

## 2014-03-09 DIAGNOSIS — J329 Chronic sinusitis, unspecified: Secondary | ICD-10-CM | POA: Diagnosis not present

## 2014-03-09 DIAGNOSIS — J341 Cyst and mucocele of nose and nasal sinus: Secondary | ICD-10-CM | POA: Diagnosis not present

## 2014-03-26 DIAGNOSIS — J310 Chronic rhinitis: Secondary | ICD-10-CM | POA: Diagnosis not present

## 2014-03-26 DIAGNOSIS — H6983 Other specified disorders of Eustachian tube, bilateral: Secondary | ICD-10-CM | POA: Diagnosis not present

## 2014-03-26 DIAGNOSIS — J343 Hypertrophy of nasal turbinates: Secondary | ICD-10-CM | POA: Diagnosis not present

## 2014-03-26 DIAGNOSIS — H903 Sensorineural hearing loss, bilateral: Secondary | ICD-10-CM | POA: Diagnosis not present

## 2014-03-30 DIAGNOSIS — H9012 Conductive hearing loss, unilateral, left ear, with unrestricted hearing on the contralateral side: Secondary | ICD-10-CM | POA: Diagnosis not present

## 2014-03-30 DIAGNOSIS — H6982 Other specified disorders of Eustachian tube, left ear: Secondary | ICD-10-CM | POA: Diagnosis not present

## 2014-04-23 ENCOUNTER — Other Ambulatory Visit (INDEPENDENT_AMBULATORY_CARE_PROVIDER_SITE_OTHER): Payer: Self-pay | Admitting: Otolaryngology

## 2014-04-23 DIAGNOSIS — J343 Hypertrophy of nasal turbinates: Secondary | ICD-10-CM | POA: Diagnosis not present

## 2014-04-23 DIAGNOSIS — J310 Chronic rhinitis: Secondary | ICD-10-CM | POA: Diagnosis not present

## 2014-04-23 DIAGNOSIS — H903 Sensorineural hearing loss, bilateral: Secondary | ICD-10-CM | POA: Diagnosis not present

## 2014-04-23 DIAGNOSIS — H6983 Other specified disorders of Eustachian tube, bilateral: Secondary | ICD-10-CM | POA: Diagnosis not present

## 2014-04-23 DIAGNOSIS — J329 Chronic sinusitis, unspecified: Secondary | ICD-10-CM

## 2014-04-30 ENCOUNTER — Ambulatory Visit
Admission: RE | Admit: 2014-04-30 | Discharge: 2014-04-30 | Disposition: A | Discharge status: Home / Self Care | Payer: Commercial Managed Care - HMO | Source: Ambulatory Visit | Attending: Otolaryngology | Admitting: Otolaryngology

## 2014-04-30 ENCOUNTER — Other Ambulatory Visit (INDEPENDENT_AMBULATORY_CARE_PROVIDER_SITE_OTHER): Payer: Self-pay | Admitting: Otolaryngology

## 2014-04-30 DIAGNOSIS — J018 Other acute sinusitis: Secondary | ICD-10-CM

## 2014-04-30 DIAGNOSIS — R0981 Nasal congestion: Secondary | ICD-10-CM | POA: Diagnosis not present

## 2014-04-30 DIAGNOSIS — J3489 Other specified disorders of nose and nasal sinuses: Secondary | ICD-10-CM | POA: Diagnosis not present

## 2014-04-30 DIAGNOSIS — J329 Chronic sinusitis, unspecified: Secondary | ICD-10-CM

## 2014-05-14 DIAGNOSIS — J310 Chronic rhinitis: Secondary | ICD-10-CM | POA: Diagnosis not present

## 2014-05-14 DIAGNOSIS — J343 Hypertrophy of nasal turbinates: Secondary | ICD-10-CM | POA: Diagnosis not present

## 2014-05-21 ENCOUNTER — Other Ambulatory Visit: Payer: Self-pay | Admitting: Otolaryngology

## 2014-05-28 ENCOUNTER — Encounter (HOSPITAL_BASED_OUTPATIENT_CLINIC_OR_DEPARTMENT_OTHER): Payer: Self-pay | Admitting: *Deleted

## 2014-05-28 NOTE — Progress Notes (Signed)
No labs needed-denies any cardiac problems-

## 2014-06-04 ENCOUNTER — Ambulatory Visit (HOSPITAL_BASED_OUTPATIENT_CLINIC_OR_DEPARTMENT_OTHER): Payer: Commercial Managed Care - HMO | Admitting: Anesthesiology

## 2014-06-04 ENCOUNTER — Ambulatory Visit (HOSPITAL_BASED_OUTPATIENT_CLINIC_OR_DEPARTMENT_OTHER)
Admission: RE | Admit: 2014-06-04 | Discharge: 2014-06-04 | Disposition: A | Discharge status: Home / Self Care | Payer: Commercial Managed Care - HMO | Source: Ambulatory Visit | Attending: Otolaryngology | Admitting: Otolaryngology

## 2014-06-04 ENCOUNTER — Encounter (HOSPITAL_BASED_OUTPATIENT_CLINIC_OR_DEPARTMENT_OTHER): Payer: Self-pay | Admitting: Anesthesiology

## 2014-06-04 ENCOUNTER — Encounter (HOSPITAL_BASED_OUTPATIENT_CLINIC_OR_DEPARTMENT_OTHER)
Admission: RE | Disposition: A | Discharge status: Home / Self Care | Payer: Self-pay | Source: Ambulatory Visit | Attending: Otolaryngology

## 2014-06-04 DIAGNOSIS — F329 Major depressive disorder, single episode, unspecified: Secondary | ICD-10-CM | POA: Insufficient documentation

## 2014-06-04 DIAGNOSIS — Z87891 Personal history of nicotine dependence: Secondary | ICD-10-CM | POA: Insufficient documentation

## 2014-06-04 DIAGNOSIS — J343 Hypertrophy of nasal turbinates: Secondary | ICD-10-CM | POA: Insufficient documentation

## 2014-06-04 DIAGNOSIS — H409 Unspecified glaucoma: Secondary | ICD-10-CM | POA: Insufficient documentation

## 2014-06-04 HISTORY — PX: TURBINATE REDUCTION: SHX6157

## 2014-06-04 HISTORY — DX: Other seasonal allergic rhinitis: J30.2

## 2014-06-04 HISTORY — DX: Bronchitis, not specified as acute or chronic: J40

## 2014-06-04 HISTORY — DX: Presence of spectacles and contact lenses: Z97.3

## 2014-06-04 LAB — POCT HEMOGLOBIN-HEMACUE: Hemoglobin: 11.9 g/dL — ABNORMAL LOW (ref 12.0–15.0)

## 2014-06-04 SURGERY — REDUCTION, NASAL TURBINATE
Anesthesia: General | Site: Nose | Laterality: Bilateral

## 2014-06-04 MED ORDER — DEXAMETHASONE SODIUM PHOSPHATE 4 MG/ML IJ SOLN
INTRAMUSCULAR | Status: DC | PRN
  Administered 2014-06-04: 10 mg via INTRAVENOUS

## 2014-06-04 MED ORDER — OXYCODONE HCL 5 MG PO TABS
5.0000 mg | ORAL_TABLET | Freq: Once | ORAL | Status: AC
  Administered 2014-06-04: 5 mg via ORAL

## 2014-06-04 MED ORDER — MIDAZOLAM HCL 2 MG/2ML IJ SOLN: 1.0000 mg | INTRAMUSCULAR | Status: DC | PRN

## 2014-06-04 MED ORDER — OXYMETAZOLINE HCL 0.05 % NA SOLN
NASAL | Status: DC | PRN
  Administered 2014-06-04: 1 via NASAL

## 2014-06-04 MED ORDER — HYDROMORPHONE HCL 1 MG/ML IJ SOLN: 0.2500 mg | INTRAMUSCULAR | Status: DC | PRN

## 2014-06-04 MED ORDER — FENTANYL CITRATE 0.05 MG/ML IJ SOLN: 50.0000 ug | INTRAMUSCULAR | Status: DC | PRN

## 2014-06-04 MED ORDER — GLYCOPYRROLATE 0.2 MG/ML IJ SOLN
INTRAMUSCULAR | Status: DC | PRN
  Administered 2014-06-04: 0.2 mg via INTRAVENOUS

## 2014-06-04 MED ORDER — MIDAZOLAM HCL 2 MG/2ML IJ SOLN
INTRAMUSCULAR | Status: AC
  Filled 2014-06-04: qty 2

## 2014-06-04 MED ORDER — MIDAZOLAM HCL 5 MG/5ML IJ SOLN
INTRAMUSCULAR | Status: DC | PRN
  Administered 2014-06-04: 2 mg via INTRAVENOUS

## 2014-06-04 MED ORDER — SUCCINYLCHOLINE CHLORIDE 20 MG/ML IJ SOLN
INTRAMUSCULAR | Status: DC | PRN
  Administered 2014-06-04: 50 mg via INTRAVENOUS

## 2014-06-04 MED ORDER — LIDOCAINE HCL (CARDIAC) 20 MG/ML IV SOLN
INTRAVENOUS | Status: DC | PRN
  Administered 2014-06-04: 80 mg via INTRAVENOUS

## 2014-06-04 MED ORDER — OXYCODONE-ACETAMINOPHEN 5-325 MG PO TABS: 1.0000 | ORAL_TABLET | ORAL | Status: DC | PRN

## 2014-06-04 MED ORDER — FENTANYL CITRATE 0.05 MG/ML IJ SOLN
INTRAMUSCULAR | Status: DC | PRN
  Administered 2014-06-04 (×2): 50 ug via INTRAVENOUS

## 2014-06-04 MED ORDER — LACTATED RINGERS IV SOLN
INTRAVENOUS | Status: DC
  Administered 2014-06-04 (×2): via INTRAVENOUS

## 2014-06-04 MED ORDER — OXYMETAZOLINE HCL 0.05 % NA SOLN
NASAL | Status: AC
  Filled 2014-06-04: qty 15

## 2014-06-04 MED ORDER — AMOXICILLIN 875 MG PO TABS: 875.0000 mg | ORAL_TABLET | Freq: Two times a day (BID) | ORAL | Status: DC

## 2014-06-04 MED ORDER — FENTANYL CITRATE 0.05 MG/ML IJ SOLN
INTRAMUSCULAR | Status: AC
  Filled 2014-06-04: qty 6

## 2014-06-04 MED ORDER — ONDANSETRON HCL 4 MG/2ML IJ SOLN
INTRAMUSCULAR | Status: DC | PRN
  Administered 2014-06-04: 4 mg via INTRAVENOUS

## 2014-06-04 MED ORDER — OXYCODONE HCL 5 MG PO TABS
ORAL_TABLET | ORAL | Status: AC
  Filled 2014-06-04: qty 1

## 2014-06-04 MED ORDER — PROPOFOL 10 MG/ML IV BOLUS
INTRAVENOUS | Status: DC | PRN
  Administered 2014-06-04: 180 mg via INTRAVENOUS

## 2014-06-04 SURGICAL SUPPLY — 23 items
ATTRACTOMAT 16X20 MAGNETIC DRP (DRAPES) IMPLANT
CANISTER SUCT 1200ML W/VALVE (MISCELLANEOUS) ×3 IMPLANT
COAGULATOR SUCT 8FR VV (MISCELLANEOUS) ×2 IMPLANT
DECANTER SPIKE VIAL GLASS SM (MISCELLANEOUS) IMPLANT
ELECT REM PT RETURN 9FT ADLT (ELECTROSURGICAL) ×3
ELECTRODE REM PT RTRN 9FT ADLT (ELECTROSURGICAL) ×1 IMPLANT
GLOVE BIO SURGEON STRL SZ7.5 (GLOVE) ×3 IMPLANT
GLOVE SURG SS PI 7.0 STRL IVOR (GLOVE) ×2 IMPLANT
GOWN STRL REUS W/ TWL LRG LVL3 (GOWN DISPOSABLE) ×2 IMPLANT
GOWN STRL REUS W/TWL LRG LVL3 (GOWN DISPOSABLE) ×6
NDL HYPO 25X1 1.5 SAFETY (NEEDLE) IMPLANT
NEEDLE HYPO 25X1 1.5 SAFETY (NEEDLE) IMPLANT
NS IRRIG 1000ML POUR BTL (IV SOLUTION) ×3 IMPLANT
PACK BASIN DAY SURGERY FS (CUSTOM PROCEDURE TRAY) ×3 IMPLANT
PACK ENT DAY SURGERY (CUSTOM PROCEDURE TRAY) ×3 IMPLANT
PATTIES SURGICAL .5 X3 (DISPOSABLE) IMPLANT
SLEEVE SCD COMPRESS KNEE MED (MISCELLANEOUS) IMPLANT
SOLUTION BUTLER CLEAR DIP (MISCELLANEOUS) ×3 IMPLANT
SPONGE GAUZE 2X2 8PLY STER LF (GAUZE/BANDAGES/DRESSINGS) ×1
SPONGE GAUZE 2X2 8PLY STRL LF (GAUZE/BANDAGES/DRESSINGS) ×2 IMPLANT
SPONGE NEURO XRAY DETECT 1X3 (DISPOSABLE) ×3 IMPLANT
TOWEL OR 17X24 6PK STRL BLUE (TOWEL DISPOSABLE) ×3 IMPLANT
YANKAUER SUCT BULB TIP NO VENT (SUCTIONS) IMPLANT

## 2014-06-04 NOTE — Discharge Instructions (Addendum)
POSTOPERATIVE INSTRUCTIONS FOR PATIENTS HAVING NASAL OR SINUS OPERATIONS °ACTIVITY: Restrict activity at home for the first two days, resting as much as possible. Light activity is best. You may usually return to work within a week. You should refrain from nose blowing, strenuous activity, or heavy lifting greater than 20lbs for a total of three weeks after your operation.  If sneezing cannot be avoided, sneeze with your mouth open. °DISCOMFORT: You may experience a dull headache and pressure along with nasal congestion and discharge. These symptoms may be worse during the first week after the operation but may last as long as two to four weeks.  Please take Tylenol or the pain medication that has been prescribed for you. Do not take aspirin or aspirin containing medications since they may cause bleeding.  You may experience symptoms of post nasal drainage, nasal congestion, headaches and fatigue for two or three months after your operation.  °BLEEDING: You may have some blood tinged nasal drainage for approximately two weeks after the operation.  The discharge will be worse for the first week.  Please call our office at [phone] or go to the nearest hospital emergency room if you experience any of the following: heavy, bright red blood from your nose or mouth that lasts longer than ten minutes or coughing up or vomiting bright red blood or blood clots. °GENERAL CONSIDERATIONS: °1. A gauze dressing will be placed on your upper lip to absorb any drainage after the operation. You may need to change this several times a day.  If you do not have very much drainage, you may remove the dressing.  Remember that you may gently wipe your nose with a tissue and sniff in, but DO NOT blow your nose. °2. Please keep all of your postoperative appointments.  Your final results after the operation will depend on proper follow-up.  The initial visit is usually four to seven days after the operation.  During this visit, the  remaining nasal packing and internal septal splints will be removed.  Your nasal and sinus cavities will be cleaned.  During the second visit, your nasal and sinus cavities will be cleaned again. Have someone drive you to your first two postoperative appointments. We suggest that you take your prescribed pain medication about ½ hour prior to each of these two appointments.  °3. How you care for your nose after the operation will influence the results that you obtain.  You should follow all directions, take your medication as prescribed, and call our office [phone] with any problems or questions. °4. You may be more comfortable sleeping with your head elevated on two pillows. °5. Do not take any medications that we have not prescribed or recommended. °WARNING SIGNS: if any of the following should occur, please call our office: °1. Bright red bleeding which lasts more than 10 minutes. °2. Persistent fever greater than 102F. °3. Persistent vomiting. °4. Severe and constant pain that is not relieved by prescribed pain medication. °5. Trauma to the nose. °6. Rash or unusual side effects from any medicines. ° ° °Post Anesthesia Home Care Instructions ° °Activity: °Get plenty of rest for the remainder of the day. A responsible adult should stay with you for 24 hours following the procedure.  °For the next 24 hours, DO NOT: °-Drive a car °-Operate machinery °-Drink alcoholic beverages °-Take any medication unless instructed by your physician °-Make any legal decisions or sign important papers. ° °Meals: °Start with liquid foods such as gelatin or soup. Progress to   regular foods as tolerated. Avoid greasy, spicy, heavy foods. If nausea and/or vomiting occur, drink only clear liquids until the nausea and/or vomiting subsides. Call your physician if vomiting continues. ° °Special Instructions/Symptoms: °Your throat may feel dry or sore from the anesthesia or the breathing tube placed in your throat during surgery. If  this causes discomfort, gargle with warm salt water. The discomfort should disappear within 24 hours. ° °If you had a scopolamine patch placed behind your ear for the management of post- operative nausea and/or vomiting: ° °1. The medication in the patch is effective for 72 hours, after which it should be removed.  Wrap patch in a tissue and discard in the trash. Wash hands thoroughly with soap and water. °2. You may remove the patch earlier than 72 hours if you experience unpleasant side effects which may include dry mouth, dizziness or visual disturbances. °3. Avoid touching the patch. Wash your hands with soap and water after contact with the patch. °  ° °

## 2014-06-04 NOTE — Op Note (Signed)
DATE OF PROCEDURE: 06/04/2014  OPERATIVE REPORT   SURGEON: Leta Baptist, MD   PREOPERATIVE DIAGNOSES:  1. Chronic nasal obstruction.  2. Bilateral inferior turbinate hypertrophy.   POSTOPERATIVE DIAGNOSES:  1. Chronic nasal obstruction.  2. Bilateral inferior turbinate hypertrophy.   PROCEDURE PERFORMED: Bilateral partial inferior turbinate resection.   ANESTHESIA: General endotracheal tube anesthesia.   COMPLICATIONS: None.   ESTIMATED BLOOD LOSS: Minimal.   INDICATION FOR PROCEDURE :Michelle Johnston is a 64 y.o. female with a history of chronic nasal obstruction. The patient was  treated with weekly allergy shots, antihistamine, decongestant, steroid nasal spray, and systemic steroids. However, the patient continues to be symptomatic. On examination, the patient was noted to have bilateral severe inferior turbinate hypertrophy, causing significant nasal obstruction. Based on the above findings, the decision was made for the patient to undergo the above-stated procedure. The risks, benefits, alternatives, and details of the procedure were discussed with the patient. Questions were invited and answered. Informed consent was obtained.   DESCRIPTION: The patient was taken to the operating room and placed supine on the operating table. General endotracheal tube anesthesia was administered by the anesthesiologist. The patient was positioned and prepped and draped in a standard fashion for nasal surgery. Pledgets soaked with Afrin were placed in both nasal cavities. The pledgets were subsequently removed. Examination of the nasal cavities revealed bilateral severe inferior turbinate hypertrophy. The inferior one-half of each inferior turbinate was then crossclamped with a straight Kelly clamp. The inferior one-half of each inferior turbinate was then resected with a pair of cross cutting scissors. Hemostasis was achieved with suction electrocautery, under direct visual guidance of the zero-degree endoscope.  Good hemostasis was achieved. The care of the patient was turned over to the anesthesiologist. The patient was awakened from anesthesia without difficulty. The patient was extubated and transferred to the recovery room in good condition.   OPERATIVE FINDINGS: Bilateral inferior turbinate hypertrophy.   SPECIMEN: None.   FOLLOWUP CARE: The patient will be discharged home once she is awake and alert. The patient will be placed on percocet1-2 tablets p.o. q.6 h. p.r.n. pain, and amoxicillin 875 mg p.o. b.i.d. for 5 days. The patient will follow up in my office in approximately 1 week.   Leta Baptist, MD

## 2014-06-04 NOTE — Anesthesia Procedure Notes (Signed)
Procedure Name: Intubation Date/Time: 06/04/2014 10:29 AM Performed by: Marrianne Mood Pre-anesthesia Checklist: Patient identified, Emergency Drugs available, Suction available, Patient being monitored and Timeout performed Patient Re-evaluated:Patient Re-evaluated prior to inductionOxygen Delivery Method: Circle System Utilized Preoxygenation: Pre-oxygenation with 100% oxygen Intubation Type: IV induction Ventilation: Mask ventilation without difficulty Laryngoscope Size: Miller and 3 Grade View: Grade II Tube type: Oral Tube size: 7.0 mm Number of attempts: 1 Airway Equipment and Method: Stylet and Oral airway Placement Confirmation: ETT inserted through vocal cords under direct vision,  positive ETCO2 and breath sounds checked- equal and bilateral Tube secured with: Tape Dental Injury: Teeth and Oropharynx as per pre-operative assessment

## 2014-06-04 NOTE — Anesthesia Postprocedure Evaluation (Signed)
  Anesthesia Post-op Note  Patient: Michelle Johnston  Procedure(s) Performed: Procedure(s): BILATERAL TURBINATE REDUCTION (Bilateral)  Patient Location: PACU  Anesthesia Type:General  Level of Consciousness: awake and alert   Airway and Oxygen Therapy: Patient Spontanous Breathing  Post-op Pain: moderate  Post-op Assessment: Post-op Vital signs reviewed, Patient's Cardiovascular Status Stable and Respiratory Function Stable  Post-op Vital Signs: Reviewed  Filed Vitals:   06/04/14 1200  BP:   Pulse: 61  Temp:   Resp: 15    Complications: No apparent anesthesia complications

## 2014-06-04 NOTE — Transfer of Care (Signed)
Immediate Anesthesia Transfer of Care Note  Patient: Michelle Johnston  Procedure(s) Performed: Procedure(s): BILATERAL TURBINATE REDUCTION (Bilateral)  Patient Location: PACU  Anesthesia Type:General  Level of Consciousness: awake and patient cooperative  Airway & Oxygen Therapy: Patient Spontanous Breathing and Patient connected to face mask oxygen  Post-op Assessment: Report given to RN and Post -op Vital signs reviewed and stable  Post vital signs: Reviewed and stable  Last Vitals:  Filed Vitals:   06/04/14 0943  BP: 140/62  Pulse: 52  Temp: 36.7 C  Resp: 20    Complications: No apparent anesthesia complications

## 2014-06-04 NOTE — H&P (Signed)
Cc: Chronic nasal obstruction  HPI: The patient was previously seen for chronic rhinitis, nasal mucosal congestion and bilateral inferior turbinate hypertrophy. The patient also has a history of recurrent facial pain.  On her most recent sinus CT scan, her sinuses were noted to be free of acute or chronic infection. She continues to have bilateral inferior turbinate hypertrophy. The patient returns reporting persistent nasal congestion.  Her facial pain has improved.  Currently she denies any purulent drainage, fever or visual change.    Exam: The nasal cavities were decongested and anesthetised with a combination of oxymetazoline and 4% lidocaine solution.  The flexible scope was inserted into the right nasal cavity.  Endoscopy of the inferior and middle meatus was performed.  Edematous mucosa was noted.  The sinus openings were patent. No polyp, mass, or lesion was appreciated.  Olfactory cleft was clear.  Nasopharynx was clear.  Turbinates were hypertrophied but without mass.  Incomplete response to decongestion.  The procedure was repeated on the contralateral side with similar findings.  The patient tolerated the procedure well.  Instructions were given to avoid eating or drinking for 2 hours.    Assessment: 1.  Chronic rhinitis with bilateral inferior turbinate hypertrophy.   2.  No purulent drainage, polyps, mass or lesion is noted.  3.  It should be noted the patient could not tolerate the use of steroid nasal sprays due to her glaucoma.   Plan: 1.  The nasal endoscopy findings and the CT images are reviewed with the patient.  2.  The treatment options of continuing conservative observation, saline irrigation, or turbinate reduction are reviewed.  The risks, benefits, alternatives and details of the turbinate procedure are reviewed.  3.  The patient is interested in proceeding with the turbinate reduction procedure.  We will schedule the procedure in accordance with the patient's schedule.

## 2014-06-04 NOTE — Anesthesia Preprocedure Evaluation (Addendum)
Anesthesia Evaluation  Patient identified by MRN, date of birth, ID band Patient awake    Reviewed: Allergy & Precautions, H&P , NPO status , Patient's Chart, lab work & pertinent test results  Airway Mallampati: II  TM Distance: >3 FB Neck ROM: Full    Dental no notable dental hx. (+) Teeth Intact, Dental Advisory Given   Pulmonary neg pulmonary ROS, former smoker,  breath sounds clear to auscultation  Pulmonary exam normal       Cardiovascular negative cardio ROS  Rhythm:Regular Rate:Normal     Neuro/Psych Depression negative neurological ROS     GI/Hepatic negative GI ROS, Neg liver ROS,   Endo/Other  negative endocrine ROS  Renal/GU negative Renal ROS  negative genitourinary   Musculoskeletal   Abdominal   Peds  Hematology negative hematology ROS (+)   Anesthesia Other Findings   Reproductive/Obstetrics negative OB ROS                            Anesthesia Physical Anesthesia Plan  ASA: II  Anesthesia Plan: General   Post-op Pain Management:    Induction: Intravenous  Airway Management Planned: Oral ETT  Additional Equipment:   Intra-op Plan:   Post-operative Plan: Extubation in OR  Informed Consent: I have reviewed the patients History and Physical, chart, labs and discussed the procedure including the risks, benefits and alternatives for the proposed anesthesia with the patient or authorized representative who has indicated his/her understanding and acceptance.   Dental advisory given  Plan Discussed with: CRNA  Anesthesia Plan Comments:         Anesthesia Quick Evaluation

## 2014-06-06 ENCOUNTER — Encounter (HOSPITAL_BASED_OUTPATIENT_CLINIC_OR_DEPARTMENT_OTHER): Payer: Self-pay | Admitting: Otolaryngology

## 2014-07-25 DIAGNOSIS — J301 Allergic rhinitis due to pollen: Secondary | ICD-10-CM | POA: Diagnosis not present

## 2014-07-25 DIAGNOSIS — E780 Pure hypercholesterolemia: Secondary | ICD-10-CM | POA: Diagnosis not present

## 2014-07-25 DIAGNOSIS — F322 Major depressive disorder, single episode, severe without psychotic features: Secondary | ICD-10-CM | POA: Diagnosis not present

## 2014-07-25 DIAGNOSIS — H409 Unspecified glaucoma: Secondary | ICD-10-CM | POA: Diagnosis not present

## 2014-07-25 DIAGNOSIS — Z0000 Encounter for general adult medical examination without abnormal findings: Secondary | ICD-10-CM | POA: Diagnosis not present

## 2014-07-25 DIAGNOSIS — J42 Unspecified chronic bronchitis: Secondary | ICD-10-CM | POA: Diagnosis not present

## 2014-07-25 DIAGNOSIS — R030 Elevated blood-pressure reading, without diagnosis of hypertension: Secondary | ICD-10-CM | POA: Diagnosis not present

## 2014-08-08 DIAGNOSIS — H4011X2 Primary open-angle glaucoma, moderate stage: Secondary | ICD-10-CM | POA: Diagnosis not present

## 2014-10-29 DIAGNOSIS — M1712 Unilateral primary osteoarthritis, left knee: Secondary | ICD-10-CM | POA: Diagnosis not present

## 2014-11-02 DIAGNOSIS — Z7189 Other specified counseling: Secondary | ICD-10-CM | POA: Diagnosis not present

## 2014-11-02 DIAGNOSIS — Z23 Encounter for immunization: Secondary | ICD-10-CM | POA: Diagnosis not present

## 2014-11-02 DIAGNOSIS — J42 Unspecified chronic bronchitis: Secondary | ICD-10-CM | POA: Diagnosis not present

## 2014-11-21 DIAGNOSIS — Z6825 Body mass index (BMI) 25.0-25.9, adult: Secondary | ICD-10-CM | POA: Diagnosis not present

## 2014-11-21 DIAGNOSIS — Z1231 Encounter for screening mammogram for malignant neoplasm of breast: Secondary | ICD-10-CM | POA: Diagnosis not present

## 2014-11-21 DIAGNOSIS — Z124 Encounter for screening for malignant neoplasm of cervix: Secondary | ICD-10-CM | POA: Diagnosis not present

## 2014-11-21 DIAGNOSIS — Z8262 Family history of osteoporosis: Secondary | ICD-10-CM | POA: Diagnosis not present

## 2014-11-21 DIAGNOSIS — N958 Other specified menopausal and perimenopausal disorders: Secondary | ICD-10-CM | POA: Diagnosis not present

## 2014-11-21 DIAGNOSIS — Z13820 Encounter for screening for osteoporosis: Secondary | ICD-10-CM | POA: Diagnosis not present

## 2014-11-27 ENCOUNTER — Other Ambulatory Visit: Payer: Self-pay | Admitting: Gynecology

## 2014-11-27 DIAGNOSIS — R928 Other abnormal and inconclusive findings on diagnostic imaging of breast: Secondary | ICD-10-CM

## 2014-11-29 ENCOUNTER — Other Ambulatory Visit: Payer: Self-pay | Admitting: Gynecology

## 2014-11-29 ENCOUNTER — Ambulatory Visit
Admission: RE | Admit: 2014-11-29 | Discharge: 2014-11-29 | Disposition: A | Discharge status: Home / Self Care | Payer: Commercial Managed Care - HMO | Source: Ambulatory Visit | Attending: Gynecology | Admitting: Gynecology

## 2014-11-29 DIAGNOSIS — R921 Mammographic calcification found on diagnostic imaging of breast: Secondary | ICD-10-CM

## 2014-11-29 DIAGNOSIS — R928 Other abnormal and inconclusive findings on diagnostic imaging of breast: Secondary | ICD-10-CM

## 2014-12-10 ENCOUNTER — Ambulatory Visit
Admission: RE | Admit: 2014-12-10 | Discharge: 2014-12-10 | Disposition: A | Discharge status: Home / Self Care | Payer: Commercial Managed Care - HMO | Source: Ambulatory Visit | Attending: Gynecology | Admitting: Gynecology

## 2014-12-10 DIAGNOSIS — D241 Benign neoplasm of right breast: Secondary | ICD-10-CM | POA: Diagnosis not present

## 2014-12-10 DIAGNOSIS — R928 Other abnormal and inconclusive findings on diagnostic imaging of breast: Secondary | ICD-10-CM

## 2014-12-10 DIAGNOSIS — R921 Mammographic calcification found on diagnostic imaging of breast: Secondary | ICD-10-CM

## 2014-12-10 DIAGNOSIS — R920 Mammographic microcalcification found on diagnostic imaging of breast: Secondary | ICD-10-CM | POA: Diagnosis not present

## 2014-12-25 DIAGNOSIS — J310 Chronic rhinitis: Secondary | ICD-10-CM | POA: Diagnosis not present

## 2014-12-25 DIAGNOSIS — J0101 Acute recurrent maxillary sinusitis: Secondary | ICD-10-CM | POA: Diagnosis not present

## 2015-01-22 DIAGNOSIS — M1712 Unilateral primary osteoarthritis, left knee: Secondary | ICD-10-CM | POA: Diagnosis not present

## 2015-01-28 DIAGNOSIS — H6122 Impacted cerumen, left ear: Secondary | ICD-10-CM | POA: Diagnosis not present

## 2015-01-28 DIAGNOSIS — H903 Sensorineural hearing loss, bilateral: Secondary | ICD-10-CM | POA: Diagnosis not present

## 2015-01-28 DIAGNOSIS — H7202 Central perforation of tympanic membrane, left ear: Secondary | ICD-10-CM | POA: Diagnosis not present

## 2015-02-06 DIAGNOSIS — J470 Bronchiectasis with acute lower respiratory infection: Secondary | ICD-10-CM | POA: Diagnosis not present

## 2015-02-14 DIAGNOSIS — M1712 Unilateral primary osteoarthritis, left knee: Secondary | ICD-10-CM | POA: Diagnosis not present

## 2015-02-19 DIAGNOSIS — J329 Chronic sinusitis, unspecified: Secondary | ICD-10-CM | POA: Diagnosis not present

## 2015-03-05 DIAGNOSIS — H401122 Primary open-angle glaucoma, left eye, moderate stage: Secondary | ICD-10-CM | POA: Diagnosis not present

## 2015-03-05 DIAGNOSIS — H401111 Primary open-angle glaucoma, right eye, mild stage: Secondary | ICD-10-CM | POA: Diagnosis not present

## 2015-03-05 DIAGNOSIS — Z961 Presence of intraocular lens: Secondary | ICD-10-CM | POA: Diagnosis not present

## 2015-03-05 DIAGNOSIS — H2513 Age-related nuclear cataract, bilateral: Secondary | ICD-10-CM | POA: Diagnosis not present

## 2015-04-01 DIAGNOSIS — J329 Chronic sinusitis, unspecified: Secondary | ICD-10-CM | POA: Diagnosis not present

## 2015-07-30 DIAGNOSIS — M7061 Trochanteric bursitis, right hip: Secondary | ICD-10-CM | POA: Diagnosis not present

## 2015-08-15 DIAGNOSIS — M7601 Gluteal tendinitis, right hip: Secondary | ICD-10-CM | POA: Diagnosis not present

## 2015-08-15 DIAGNOSIS — M5136 Other intervertebral disc degeneration, lumbar region: Secondary | ICD-10-CM | POA: Diagnosis not present

## 2015-08-15 DIAGNOSIS — M7061 Trochanteric bursitis, right hip: Secondary | ICD-10-CM | POA: Diagnosis not present

## 2015-08-21 DIAGNOSIS — M7061 Trochanteric bursitis, right hip: Secondary | ICD-10-CM | POA: Diagnosis not present

## 2015-08-21 DIAGNOSIS — S76011D Strain of muscle, fascia and tendon of right hip, subsequent encounter: Secondary | ICD-10-CM | POA: Diagnosis not present

## 2015-08-21 DIAGNOSIS — M25551 Pain in right hip: Secondary | ICD-10-CM | POA: Diagnosis not present

## 2015-08-27 DIAGNOSIS — M7061 Trochanteric bursitis, right hip: Secondary | ICD-10-CM | POA: Diagnosis not present

## 2015-08-27 DIAGNOSIS — S76011D Strain of muscle, fascia and tendon of right hip, subsequent encounter: Secondary | ICD-10-CM | POA: Diagnosis not present

## 2015-08-27 DIAGNOSIS — M25551 Pain in right hip: Secondary | ICD-10-CM | POA: Diagnosis not present

## 2015-08-29 DIAGNOSIS — M25551 Pain in right hip: Secondary | ICD-10-CM | POA: Diagnosis not present

## 2015-08-29 DIAGNOSIS — M7061 Trochanteric bursitis, right hip: Secondary | ICD-10-CM | POA: Diagnosis not present

## 2015-08-29 DIAGNOSIS — S76011D Strain of muscle, fascia and tendon of right hip, subsequent encounter: Secondary | ICD-10-CM | POA: Diagnosis not present

## 2015-09-02 DIAGNOSIS — M25551 Pain in right hip: Secondary | ICD-10-CM | POA: Diagnosis not present

## 2015-09-02 DIAGNOSIS — S76011D Strain of muscle, fascia and tendon of right hip, subsequent encounter: Secondary | ICD-10-CM | POA: Diagnosis not present

## 2015-09-02 DIAGNOSIS — M7061 Trochanteric bursitis, right hip: Secondary | ICD-10-CM | POA: Diagnosis not present

## 2015-09-05 DIAGNOSIS — S76011D Strain of muscle, fascia and tendon of right hip, subsequent encounter: Secondary | ICD-10-CM | POA: Diagnosis not present

## 2015-09-05 DIAGNOSIS — M25551 Pain in right hip: Secondary | ICD-10-CM | POA: Diagnosis not present

## 2015-09-05 DIAGNOSIS — M7061 Trochanteric bursitis, right hip: Secondary | ICD-10-CM | POA: Diagnosis not present

## 2015-09-09 DIAGNOSIS — S76011D Strain of muscle, fascia and tendon of right hip, subsequent encounter: Secondary | ICD-10-CM | POA: Diagnosis not present

## 2015-09-09 DIAGNOSIS — M25551 Pain in right hip: Secondary | ICD-10-CM | POA: Diagnosis not present

## 2015-09-09 DIAGNOSIS — M7061 Trochanteric bursitis, right hip: Secondary | ICD-10-CM | POA: Diagnosis not present

## 2015-09-16 DIAGNOSIS — R030 Elevated blood-pressure reading, without diagnosis of hypertension: Secondary | ICD-10-CM | POA: Diagnosis not present

## 2015-09-16 DIAGNOSIS — F322 Major depressive disorder, single episode, severe without psychotic features: Secondary | ICD-10-CM | POA: Diagnosis not present

## 2015-09-16 DIAGNOSIS — E7800 Pure hypercholesterolemia, unspecified: Secondary | ICD-10-CM | POA: Diagnosis not present

## 2015-09-16 DIAGNOSIS — Z23 Encounter for immunization: Secondary | ICD-10-CM | POA: Diagnosis not present

## 2015-09-16 DIAGNOSIS — H409 Unspecified glaucoma: Secondary | ICD-10-CM | POA: Diagnosis not present

## 2015-09-16 DIAGNOSIS — Z0000 Encounter for general adult medical examination without abnormal findings: Secondary | ICD-10-CM | POA: Diagnosis not present

## 2015-09-16 DIAGNOSIS — Z1211 Encounter for screening for malignant neoplasm of colon: Secondary | ICD-10-CM | POA: Diagnosis not present

## 2015-09-16 DIAGNOSIS — J301 Allergic rhinitis due to pollen: Secondary | ICD-10-CM | POA: Diagnosis not present

## 2015-09-16 DIAGNOSIS — J470 Bronchiectasis with acute lower respiratory infection: Secondary | ICD-10-CM | POA: Diagnosis not present

## 2015-09-16 DIAGNOSIS — J42 Unspecified chronic bronchitis: Secondary | ICD-10-CM | POA: Diagnosis not present

## 2015-11-14 DIAGNOSIS — M545 Low back pain: Secondary | ICD-10-CM | POA: Diagnosis not present

## 2015-11-14 DIAGNOSIS — M47817 Spondylosis without myelopathy or radiculopathy, lumbosacral region: Secondary | ICD-10-CM | POA: Diagnosis not present

## 2015-11-18 DIAGNOSIS — H401111 Primary open-angle glaucoma, right eye, mild stage: Secondary | ICD-10-CM | POA: Diagnosis not present

## 2015-11-18 DIAGNOSIS — H401122 Primary open-angle glaucoma, left eye, moderate stage: Secondary | ICD-10-CM | POA: Diagnosis not present

## 2015-11-21 DIAGNOSIS — M545 Low back pain: Secondary | ICD-10-CM | POA: Diagnosis not present

## 2015-11-22 DIAGNOSIS — Z23 Encounter for immunization: Secondary | ICD-10-CM | POA: Diagnosis not present

## 2015-11-25 DIAGNOSIS — Z1231 Encounter for screening mammogram for malignant neoplasm of breast: Secondary | ICD-10-CM | POA: Diagnosis not present

## 2015-11-25 DIAGNOSIS — Z01419 Encounter for gynecological examination (general) (routine) without abnormal findings: Secondary | ICD-10-CM | POA: Diagnosis not present

## 2015-11-25 DIAGNOSIS — Z6826 Body mass index (BMI) 26.0-26.9, adult: Secondary | ICD-10-CM | POA: Diagnosis not present

## 2015-11-28 DIAGNOSIS — M545 Low back pain: Secondary | ICD-10-CM | POA: Diagnosis not present

## 2015-11-28 DIAGNOSIS — M47817 Spondylosis without myelopathy or radiculopathy, lumbosacral region: Secondary | ICD-10-CM | POA: Diagnosis not present

## 2015-12-04 DIAGNOSIS — M545 Low back pain: Secondary | ICD-10-CM | POA: Diagnosis not present

## 2015-12-04 DIAGNOSIS — M47817 Spondylosis without myelopathy or radiculopathy, lumbosacral region: Secondary | ICD-10-CM | POA: Diagnosis not present

## 2015-12-04 DIAGNOSIS — M5136 Other intervertebral disc degeneration, lumbar region: Secondary | ICD-10-CM | POA: Diagnosis not present

## 2015-12-04 DIAGNOSIS — M6281 Muscle weakness (generalized): Secondary | ICD-10-CM | POA: Diagnosis not present

## 2015-12-05 DIAGNOSIS — J329 Chronic sinusitis, unspecified: Secondary | ICD-10-CM | POA: Diagnosis not present

## 2015-12-13 DIAGNOSIS — M47817 Spondylosis without myelopathy or radiculopathy, lumbosacral region: Secondary | ICD-10-CM | POA: Diagnosis not present

## 2015-12-13 DIAGNOSIS — M545 Low back pain: Secondary | ICD-10-CM | POA: Diagnosis not present

## 2015-12-13 DIAGNOSIS — M6281 Muscle weakness (generalized): Secondary | ICD-10-CM | POA: Diagnosis not present

## 2015-12-13 DIAGNOSIS — M5136 Other intervertebral disc degeneration, lumbar region: Secondary | ICD-10-CM | POA: Diagnosis not present

## 2015-12-17 DIAGNOSIS — M5136 Other intervertebral disc degeneration, lumbar region: Secondary | ICD-10-CM | POA: Diagnosis not present

## 2015-12-17 DIAGNOSIS — M6281 Muscle weakness (generalized): Secondary | ICD-10-CM | POA: Diagnosis not present

## 2015-12-17 DIAGNOSIS — M47817 Spondylosis without myelopathy or radiculopathy, lumbosacral region: Secondary | ICD-10-CM | POA: Diagnosis not present

## 2015-12-17 DIAGNOSIS — M545 Low back pain: Secondary | ICD-10-CM | POA: Diagnosis not present

## 2015-12-19 DIAGNOSIS — M47817 Spondylosis without myelopathy or radiculopathy, lumbosacral region: Secondary | ICD-10-CM | POA: Diagnosis not present

## 2015-12-19 DIAGNOSIS — M5136 Other intervertebral disc degeneration, lumbar region: Secondary | ICD-10-CM | POA: Diagnosis not present

## 2015-12-19 DIAGNOSIS — M6281 Muscle weakness (generalized): Secondary | ICD-10-CM | POA: Diagnosis not present

## 2015-12-19 DIAGNOSIS — M545 Low back pain: Secondary | ICD-10-CM | POA: Diagnosis not present

## 2015-12-24 DIAGNOSIS — M6281 Muscle weakness (generalized): Secondary | ICD-10-CM | POA: Diagnosis not present

## 2015-12-24 DIAGNOSIS — M545 Low back pain: Secondary | ICD-10-CM | POA: Diagnosis not present

## 2015-12-24 DIAGNOSIS — M5136 Other intervertebral disc degeneration, lumbar region: Secondary | ICD-10-CM | POA: Diagnosis not present

## 2015-12-24 DIAGNOSIS — M47817 Spondylosis without myelopathy or radiculopathy, lumbosacral region: Secondary | ICD-10-CM | POA: Diagnosis not present

## 2015-12-26 DIAGNOSIS — M47817 Spondylosis without myelopathy or radiculopathy, lumbosacral region: Secondary | ICD-10-CM | POA: Diagnosis not present

## 2015-12-26 DIAGNOSIS — M6281 Muscle weakness (generalized): Secondary | ICD-10-CM | POA: Diagnosis not present

## 2015-12-26 DIAGNOSIS — M5136 Other intervertebral disc degeneration, lumbar region: Secondary | ICD-10-CM | POA: Diagnosis not present

## 2015-12-26 DIAGNOSIS — M545 Low back pain: Secondary | ICD-10-CM | POA: Diagnosis not present

## 2016-01-06 DIAGNOSIS — M545 Low back pain: Secondary | ICD-10-CM | POA: Diagnosis not present

## 2016-01-06 DIAGNOSIS — M47817 Spondylosis without myelopathy or radiculopathy, lumbosacral region: Secondary | ICD-10-CM | POA: Diagnosis not present

## 2016-01-06 DIAGNOSIS — H9202 Otalgia, left ear: Secondary | ICD-10-CM | POA: Diagnosis not present

## 2016-01-07 DIAGNOSIS — Z86010 Personal history of colonic polyps: Secondary | ICD-10-CM | POA: Diagnosis not present

## 2016-01-07 DIAGNOSIS — R109 Unspecified abdominal pain: Secondary | ICD-10-CM | POA: Diagnosis not present

## 2016-01-07 DIAGNOSIS — Z800 Family history of malignant neoplasm of digestive organs: Secondary | ICD-10-CM | POA: Diagnosis not present

## 2016-01-28 DIAGNOSIS — M545 Low back pain: Secondary | ICD-10-CM | POA: Diagnosis not present

## 2016-01-28 DIAGNOSIS — M47817 Spondylosis without myelopathy or radiculopathy, lumbosacral region: Secondary | ICD-10-CM | POA: Diagnosis not present

## 2016-03-03 DIAGNOSIS — K219 Gastro-esophageal reflux disease without esophagitis: Secondary | ICD-10-CM | POA: Diagnosis not present

## 2016-03-03 DIAGNOSIS — M549 Dorsalgia, unspecified: Secondary | ICD-10-CM | POA: Diagnosis not present

## 2016-03-03 DIAGNOSIS — J42 Unspecified chronic bronchitis: Secondary | ICD-10-CM | POA: Diagnosis not present

## 2016-04-20 DIAGNOSIS — J329 Chronic sinusitis, unspecified: Secondary | ICD-10-CM | POA: Diagnosis not present

## 2016-04-20 DIAGNOSIS — J3489 Other specified disorders of nose and nasal sinuses: Secondary | ICD-10-CM | POA: Diagnosis not present

## 2016-05-18 DIAGNOSIS — R062 Wheezing: Secondary | ICD-10-CM | POA: Diagnosis not present

## 2016-05-18 DIAGNOSIS — R05 Cough: Secondary | ICD-10-CM | POA: Diagnosis not present

## 2016-06-04 DIAGNOSIS — J42 Unspecified chronic bronchitis: Secondary | ICD-10-CM | POA: Diagnosis not present

## 2016-06-04 DIAGNOSIS — J309 Allergic rhinitis, unspecified: Secondary | ICD-10-CM | POA: Diagnosis not present

## 2016-06-25 DIAGNOSIS — R1013 Epigastric pain: Secondary | ICD-10-CM | POA: Diagnosis not present

## 2016-06-25 DIAGNOSIS — K3189 Other diseases of stomach and duodenum: Secondary | ICD-10-CM | POA: Diagnosis not present

## 2016-06-25 DIAGNOSIS — Z86010 Personal history of colonic polyps: Secondary | ICD-10-CM | POA: Diagnosis not present

## 2016-06-25 DIAGNOSIS — D126 Benign neoplasm of colon, unspecified: Secondary | ICD-10-CM | POA: Diagnosis not present

## 2016-06-25 DIAGNOSIS — K319 Disease of stomach and duodenum, unspecified: Secondary | ICD-10-CM | POA: Diagnosis not present

## 2016-06-25 DIAGNOSIS — K5730 Diverticulosis of large intestine without perforation or abscess without bleeding: Secondary | ICD-10-CM | POA: Diagnosis not present

## 2016-07-01 DIAGNOSIS — D126 Benign neoplasm of colon, unspecified: Secondary | ICD-10-CM | POA: Diagnosis not present

## 2016-07-01 DIAGNOSIS — K319 Disease of stomach and duodenum, unspecified: Secondary | ICD-10-CM | POA: Diagnosis not present

## 2016-07-06 DIAGNOSIS — H401311 Pigmentary glaucoma, right eye, mild stage: Secondary | ICD-10-CM | POA: Diagnosis not present

## 2016-07-09 DIAGNOSIS — J302 Other seasonal allergic rhinitis: Secondary | ICD-10-CM | POA: Diagnosis not present

## 2016-07-09 DIAGNOSIS — R0989 Other specified symptoms and signs involving the circulatory and respiratory systems: Secondary | ICD-10-CM | POA: Diagnosis not present

## 2016-08-17 DIAGNOSIS — M47817 Spondylosis without myelopathy or radiculopathy, lumbosacral region: Secondary | ICD-10-CM | POA: Diagnosis not present

## 2016-08-17 DIAGNOSIS — M545 Low back pain: Secondary | ICD-10-CM | POA: Diagnosis not present

## 2016-11-03 DIAGNOSIS — J309 Allergic rhinitis, unspecified: Secondary | ICD-10-CM | POA: Diagnosis not present

## 2016-11-03 DIAGNOSIS — F322 Major depressive disorder, single episode, severe without psychotic features: Secondary | ICD-10-CM | POA: Diagnosis not present

## 2016-11-03 DIAGNOSIS — R030 Elevated blood-pressure reading, without diagnosis of hypertension: Secondary | ICD-10-CM | POA: Diagnosis not present

## 2016-11-03 DIAGNOSIS — Z86010 Personal history of colonic polyps: Secondary | ICD-10-CM | POA: Diagnosis not present

## 2016-11-03 DIAGNOSIS — M4800 Spinal stenosis, site unspecified: Secondary | ICD-10-CM | POA: Diagnosis not present

## 2016-11-03 DIAGNOSIS — Z1159 Encounter for screening for other viral diseases: Secondary | ICD-10-CM | POA: Diagnosis not present

## 2016-11-03 DIAGNOSIS — J42 Unspecified chronic bronchitis: Secondary | ICD-10-CM | POA: Diagnosis not present

## 2016-11-03 DIAGNOSIS — Z23 Encounter for immunization: Secondary | ICD-10-CM | POA: Diagnosis not present

## 2016-11-03 DIAGNOSIS — E7800 Pure hypercholesterolemia, unspecified: Secondary | ICD-10-CM | POA: Diagnosis not present

## 2016-11-03 DIAGNOSIS — Z0000 Encounter for general adult medical examination without abnormal findings: Secondary | ICD-10-CM | POA: Diagnosis not present

## 2016-11-03 DIAGNOSIS — K219 Gastro-esophageal reflux disease without esophagitis: Secondary | ICD-10-CM | POA: Diagnosis not present

## 2016-11-04 DIAGNOSIS — M545 Low back pain: Secondary | ICD-10-CM | POA: Diagnosis not present

## 2016-11-04 DIAGNOSIS — M47817 Spondylosis without myelopathy or radiculopathy, lumbosacral region: Secondary | ICD-10-CM | POA: Diagnosis not present

## 2016-11-11 DIAGNOSIS — M545 Low back pain: Secondary | ICD-10-CM | POA: Diagnosis not present

## 2016-11-11 DIAGNOSIS — M25551 Pain in right hip: Secondary | ICD-10-CM | POA: Diagnosis not present

## 2016-11-11 DIAGNOSIS — R2689 Other abnormalities of gait and mobility: Secondary | ICD-10-CM | POA: Diagnosis not present

## 2016-11-13 DIAGNOSIS — M25551 Pain in right hip: Secondary | ICD-10-CM | POA: Diagnosis not present

## 2016-11-13 DIAGNOSIS — R2689 Other abnormalities of gait and mobility: Secondary | ICD-10-CM | POA: Diagnosis not present

## 2016-11-13 DIAGNOSIS — M545 Low back pain: Secondary | ICD-10-CM | POA: Diagnosis not present

## 2016-11-18 DIAGNOSIS — R2689 Other abnormalities of gait and mobility: Secondary | ICD-10-CM | POA: Diagnosis not present

## 2016-11-18 DIAGNOSIS — M25551 Pain in right hip: Secondary | ICD-10-CM | POA: Diagnosis not present

## 2016-11-18 DIAGNOSIS — M545 Low back pain: Secondary | ICD-10-CM | POA: Diagnosis not present

## 2016-11-19 DIAGNOSIS — M25551 Pain in right hip: Secondary | ICD-10-CM | POA: Diagnosis not present

## 2016-11-19 DIAGNOSIS — R2689 Other abnormalities of gait and mobility: Secondary | ICD-10-CM | POA: Diagnosis not present

## 2016-11-19 DIAGNOSIS — M545 Low back pain: Secondary | ICD-10-CM | POA: Diagnosis not present

## 2016-11-20 DIAGNOSIS — M47817 Spondylosis without myelopathy or radiculopathy, lumbosacral region: Secondary | ICD-10-CM | POA: Diagnosis not present

## 2016-11-20 DIAGNOSIS — M545 Low back pain: Secondary | ICD-10-CM | POA: Diagnosis not present

## 2016-11-26 DIAGNOSIS — M545 Low back pain: Secondary | ICD-10-CM | POA: Diagnosis not present

## 2016-11-26 DIAGNOSIS — M25551 Pain in right hip: Secondary | ICD-10-CM | POA: Diagnosis not present

## 2016-11-26 DIAGNOSIS — R2689 Other abnormalities of gait and mobility: Secondary | ICD-10-CM | POA: Diagnosis not present

## 2016-11-30 ENCOUNTER — Ambulatory Visit (INDEPENDENT_AMBULATORY_CARE_PROVIDER_SITE_OTHER): Payer: Commercial Managed Care - HMO | Admitting: Orthopaedic Surgery

## 2016-11-30 DIAGNOSIS — M25562 Pain in left knee: Secondary | ICD-10-CM

## 2016-11-30 DIAGNOSIS — M1712 Unilateral primary osteoarthritis, left knee: Secondary | ICD-10-CM

## 2016-11-30 DIAGNOSIS — G8929 Other chronic pain: Secondary | ICD-10-CM | POA: Diagnosis not present

## 2016-11-30 MED ORDER — METHYLPREDNISOLONE ACETATE 40 MG/ML IJ SUSP
40.0000 mg | INTRAMUSCULAR | Status: AC | PRN
  Administered 2016-11-30: 40 mg via INTRA_ARTICULAR

## 2016-11-30 MED ORDER — LIDOCAINE HCL 1 % IJ SOLN
3.0000 mL | INTRAMUSCULAR | Status: AC | PRN
  Administered 2016-11-30: 3 mL

## 2016-11-30 NOTE — Progress Notes (Signed)
Office Visit Note   Patient: Michelle Johnston           Date of Birth: [birth year]           MRN: [id number] Visit Date: 11/30/2016              Requested by: Mayra Neer, MD [address] [place], [place] [address] PCP: Mayra Neer, MD   Assessment & Plan: Visit Diagnoses:  1. Chronic pain of left knee   2. Unilateral primary osteoarthritis, left knee     Plan: We talked about the risk and benefits of injections and the rationale behind injections. All questions were encouraged and answered. We injected a steroid her left knee without difficulty. We will order hyaluronic acid for her left knee to place it at her next visit.  Follow-Up Instructions: Return in about 4 weeks (around 12/28/2016).   Orders:  Orders Placed This Encounter  Procedures  . Large Joint Injection/Arthrocentesis   No orders of the defined types were placed in this encounter.     Procedures: Large Joint Inj Date/Time: 11/30/2016 9:45 AM Performed by: Mcarthur Rossetti Authorized by: Mcarthur Rossetti   Location:  Knee Site:  L knee Ultrasound Guidance: No   Fluoroscopic Guidance: No   Arthrogram: No   Medications:  3 mL lidocaine 1 %; 40 mg methylPREDNISolone acetate 40 MG/ML     Clinical Data: No additional findings.   Subjective: No chief complaint on file. The patient is here today for treatment of left knee pain. She has a history of osteoarthritis of left knee and has not been seen for a little while. In 2016 she had a hyaluronic acid injection in her knee wants consider that again for her left knee. She has not had any significant change in her medical status. She is a very active 66 year old and is in water aerobics as well. She has known valgus malalignment of that knee and known osteoarthritis of the. We have not x-rayed in a while but she is really requesting a steroid injection today and considering hyaluronic acid again.  HPI  Review of Systems She  currently denies any headache, chest pain, shortness of breath, fever, chills, nausea, vomiting  Objective: Vital Signs: There were no vitals taken for this visit.  Physical Exam She is alert and oriented 66 and in no acute distress Ortho Exam Examination of her left knee shows no effusion. There is slight valgus malalignment with good range of motion overall. The knee feels Lemus a stable. Specialty Comments:  No specialty comments available.  Imaging: No results found.   PMFS History: Patient Active Problem List   Diagnosis Date Noted  . Unilateral primary osteoarthritis, left knee 11/30/2016  . Chronic pain of left knee 11/30/2016   Past Medical History:  Diagnosis Date  . Bradycardia   . Bronchitis   . Depression   . Glaucoma   . Seasonal allergies   . Wears glasses     No family history on file.  Past Surgical History:  Procedure Laterality Date  . ABDOMINAL HYSTERECTOMY  1990  . COLONOSCOPY    . NASAL SINUS SURGERY  1975  . ORIF ANKLE FRACTURE  2009   left  . TURBINATE REDUCTION Bilateral 06/04/2014   Procedure: BILATERAL TURBINATE REDUCTION;  Surgeon: Leta Baptist, MD;  Location: [facility];  Service: ENT;  Laterality: Bilateral;  . TYMPANOSTOMY TUBE PLACEMENT     left   Social History   Occupational  History  . Not on file.   Social History Main Topics  . Smoking status: Former Smoker    Quit date: 05/28/1998  . Smokeless tobacco: Not on file  . Alcohol use Yes     Comment: daily  . Drug use: No  . Sexual activity: Not on file

## 2016-12-01 ENCOUNTER — Other Ambulatory Visit (INDEPENDENT_AMBULATORY_CARE_PROVIDER_SITE_OTHER): Payer: Self-pay

## 2016-12-03 DIAGNOSIS — M545 Low back pain: Secondary | ICD-10-CM | POA: Diagnosis not present

## 2016-12-03 DIAGNOSIS — M25551 Pain in right hip: Secondary | ICD-10-CM | POA: Diagnosis not present

## 2016-12-03 DIAGNOSIS — R2689 Other abnormalities of gait and mobility: Secondary | ICD-10-CM | POA: Diagnosis not present

## 2016-12-07 DIAGNOSIS — R2689 Other abnormalities of gait and mobility: Secondary | ICD-10-CM | POA: Diagnosis not present

## 2016-12-07 DIAGNOSIS — M545 Low back pain: Secondary | ICD-10-CM | POA: Diagnosis not present

## 2016-12-07 DIAGNOSIS — M25551 Pain in right hip: Secondary | ICD-10-CM | POA: Diagnosis not present

## 2016-12-10 DIAGNOSIS — M25551 Pain in right hip: Secondary | ICD-10-CM | POA: Diagnosis not present

## 2016-12-10 DIAGNOSIS — M545 Low back pain: Secondary | ICD-10-CM | POA: Diagnosis not present

## 2016-12-10 DIAGNOSIS — R2689 Other abnormalities of gait and mobility: Secondary | ICD-10-CM | POA: Diagnosis not present

## 2016-12-14 DIAGNOSIS — R2689 Other abnormalities of gait and mobility: Secondary | ICD-10-CM | POA: Diagnosis not present

## 2016-12-14 DIAGNOSIS — M25551 Pain in right hip: Secondary | ICD-10-CM | POA: Diagnosis not present

## 2016-12-14 DIAGNOSIS — M47817 Spondylosis without myelopathy or radiculopathy, lumbosacral region: Secondary | ICD-10-CM | POA: Diagnosis not present

## 2016-12-14 DIAGNOSIS — M545 Low back pain: Secondary | ICD-10-CM | POA: Diagnosis not present

## 2016-12-17 DIAGNOSIS — M25551 Pain in right hip: Secondary | ICD-10-CM | POA: Diagnosis not present

## 2016-12-17 DIAGNOSIS — M545 Low back pain: Secondary | ICD-10-CM | POA: Diagnosis not present

## 2016-12-17 DIAGNOSIS — R2689 Other abnormalities of gait and mobility: Secondary | ICD-10-CM | POA: Diagnosis not present

## 2016-12-21 DIAGNOSIS — M545 Low back pain: Secondary | ICD-10-CM | POA: Diagnosis not present

## 2016-12-21 DIAGNOSIS — R2689 Other abnormalities of gait and mobility: Secondary | ICD-10-CM | POA: Diagnosis not present

## 2016-12-21 DIAGNOSIS — M25551 Pain in right hip: Secondary | ICD-10-CM | POA: Diagnosis not present

## 2016-12-23 DIAGNOSIS — M545 Low back pain: Secondary | ICD-10-CM | POA: Diagnosis not present

## 2016-12-23 DIAGNOSIS — M25551 Pain in right hip: Secondary | ICD-10-CM | POA: Diagnosis not present

## 2016-12-23 DIAGNOSIS — R2689 Other abnormalities of gait and mobility: Secondary | ICD-10-CM | POA: Diagnosis not present

## 2016-12-28 ENCOUNTER — Ambulatory Visit (INDEPENDENT_AMBULATORY_CARE_PROVIDER_SITE_OTHER): Payer: Commercial Managed Care - HMO | Admitting: Orthopaedic Surgery

## 2016-12-28 DIAGNOSIS — M1712 Unilateral primary osteoarthritis, left knee: Secondary | ICD-10-CM | POA: Diagnosis not present

## 2016-12-28 MED ORDER — HYALURONAN 88 MG/4ML IX SOSY
88.0000 mg | PREFILLED_SYRINGE | INTRA_ARTICULAR | Status: AC | PRN
  Administered 2016-12-28: 88 mg via INTRA_ARTICULAR

## 2016-12-28 NOTE — Progress Notes (Signed)
   Procedure Note  Patient: Michelle Johnston             Date of Birth: [birth year]           MRN: [id number]             Visit Date: 12/28/2016  Procedures: Visit Diagnoses: Unilateral primary osteoarthritis, left knee  Large Joint Inj Date/Time: 12/28/2016 9:42 AM Performed by: Mcarthur Rossetti Authorized by: Mcarthur Rossetti   Location:  Knee Ultrasound Guidance: No   Fluoroscopic Guidance: No   Arthrogram: No   Medications:  88 mg Hyaluronan 88 MG/4ML    The patient is here today for scheduled hyaluronic acid injection in her left knee with Monovisc.  She has known osteoarthritis of this knee which is at least moderate.  The combination of hyaluronic acid and steroid injections have helped her greatly.  She is always worked on activity modification and anti-inflammatories as needed.  He still does not hurt enough she states to need knee replacement surgery.  Again these injections have helped greatly in the past.  On examination of her left knee today there is no effusion.  She has excellent range of motion of the knee and is ligamentously stable.  She tolerated the Monovisc injection well.  She will follow-up as needed.  She has been can always place a steroid injection in her knee and at least 2-3 months if needed.

## 2017-01-19 DIAGNOSIS — Z124 Encounter for screening for malignant neoplasm of cervix: Secondary | ICD-10-CM | POA: Diagnosis not present

## 2017-01-19 DIAGNOSIS — Z6825 Body mass index (BMI) 25.0-25.9, adult: Secondary | ICD-10-CM | POA: Diagnosis not present

## 2017-01-19 DIAGNOSIS — N958 Other specified menopausal and perimenopausal disorders: Secondary | ICD-10-CM | POA: Diagnosis not present

## 2017-01-19 DIAGNOSIS — Z1231 Encounter for screening mammogram for malignant neoplasm of breast: Secondary | ICD-10-CM | POA: Diagnosis not present

## 2017-01-19 DIAGNOSIS — M8588 Other specified disorders of bone density and structure, other site: Secondary | ICD-10-CM | POA: Diagnosis not present

## 2017-01-28 ENCOUNTER — Other Ambulatory Visit: Payer: Self-pay | Admitting: Obstetrics & Gynecology

## 2017-01-28 ENCOUNTER — Ambulatory Visit
Admission: RE | Admit: 2017-01-28 | Discharge: 2017-01-28 | Disposition: A | Discharge status: Home / Self Care | Payer: Commercial Managed Care - HMO | Source: Ambulatory Visit | Attending: Obstetrics & Gynecology | Admitting: Obstetrics & Gynecology

## 2017-01-28 DIAGNOSIS — M954 Acquired deformity of chest and rib: Secondary | ICD-10-CM

## 2017-01-28 DIAGNOSIS — R1011 Right upper quadrant pain: Secondary | ICD-10-CM | POA: Diagnosis not present

## 2017-02-03 DIAGNOSIS — J301 Allergic rhinitis due to pollen: Secondary | ICD-10-CM | POA: Diagnosis not present

## 2017-02-03 DIAGNOSIS — R030 Elevated blood-pressure reading, without diagnosis of hypertension: Secondary | ICD-10-CM | POA: Diagnosis not present

## 2017-02-03 DIAGNOSIS — I7300 Raynaud's syndrome without gangrene: Secondary | ICD-10-CM | POA: Diagnosis not present

## 2017-02-03 DIAGNOSIS — J42 Unspecified chronic bronchitis: Secondary | ICD-10-CM | POA: Diagnosis not present

## 2017-02-03 DIAGNOSIS — M1990 Unspecified osteoarthritis, unspecified site: Secondary | ICD-10-CM | POA: Diagnosis not present

## 2017-02-25 DIAGNOSIS — H401332 Pigmentary glaucoma, bilateral, moderate stage: Secondary | ICD-10-CM | POA: Diagnosis not present

## 2017-02-25 DIAGNOSIS — H25813 Combined forms of age-related cataract, bilateral: Secondary | ICD-10-CM | POA: Diagnosis not present

## 2017-02-25 DIAGNOSIS — H4089 Other specified glaucoma: Secondary | ICD-10-CM | POA: Diagnosis not present

## 2017-03-10 DIAGNOSIS — J329 Chronic sinusitis, unspecified: Secondary | ICD-10-CM | POA: Diagnosis not present

## 2017-03-23 DIAGNOSIS — H6690 Otitis media, unspecified, unspecified ear: Secondary | ICD-10-CM | POA: Diagnosis not present

## 2017-04-06 DIAGNOSIS — H9012 Conductive hearing loss, unilateral, left ear, with unrestricted hearing on the contralateral side: Secondary | ICD-10-CM | POA: Diagnosis not present

## 2017-04-06 DIAGNOSIS — J310 Chronic rhinitis: Secondary | ICD-10-CM | POA: Diagnosis not present

## 2017-04-06 DIAGNOSIS — H6982 Other specified disorders of Eustachian tube, left ear: Secondary | ICD-10-CM | POA: Diagnosis not present

## 2017-04-06 DIAGNOSIS — J343 Hypertrophy of nasal turbinates: Secondary | ICD-10-CM | POA: Diagnosis not present

## 2017-04-14 DIAGNOSIS — H25812 Combined forms of age-related cataract, left eye: Secondary | ICD-10-CM | POA: Diagnosis not present

## 2017-04-14 DIAGNOSIS — H2513 Age-related nuclear cataract, bilateral: Secondary | ICD-10-CM | POA: Diagnosis not present

## 2017-04-30 DIAGNOSIS — H6522 Chronic serous otitis media, left ear: Secondary | ICD-10-CM | POA: Diagnosis not present

## 2017-04-30 DIAGNOSIS — H6982 Other specified disorders of Eustachian tube, left ear: Secondary | ICD-10-CM | POA: Diagnosis not present

## 2017-04-30 DIAGNOSIS — H9012 Conductive hearing loss, unilateral, left ear, with unrestricted hearing on the contralateral side: Secondary | ICD-10-CM | POA: Diagnosis not present

## 2017-05-12 DIAGNOSIS — J42 Unspecified chronic bronchitis: Secondary | ICD-10-CM | POA: Diagnosis not present

## 2017-05-14 DIAGNOSIS — H2511 Age-related nuclear cataract, right eye: Secondary | ICD-10-CM | POA: Diagnosis not present

## 2017-05-19 DIAGNOSIS — H25811 Combined forms of age-related cataract, right eye: Secondary | ICD-10-CM | POA: Diagnosis not present

## 2017-05-19 DIAGNOSIS — H2511 Age-related nuclear cataract, right eye: Secondary | ICD-10-CM | POA: Diagnosis not present

## 2017-06-02 DIAGNOSIS — M47817 Spondylosis without myelopathy or radiculopathy, lumbosacral region: Secondary | ICD-10-CM | POA: Diagnosis not present

## 2017-06-02 DIAGNOSIS — M545 Low back pain: Secondary | ICD-10-CM | POA: Diagnosis not present

## 2017-06-09 DIAGNOSIS — H7202 Central perforation of tympanic membrane, left ear: Secondary | ICD-10-CM | POA: Diagnosis not present

## 2017-06-09 DIAGNOSIS — H6982 Other specified disorders of Eustachian tube, left ear: Secondary | ICD-10-CM | POA: Diagnosis not present

## 2017-07-20 DIAGNOSIS — M47817 Spondylosis without myelopathy or radiculopathy, lumbosacral region: Secondary | ICD-10-CM | POA: Diagnosis not present

## 2017-07-20 DIAGNOSIS — M5136 Other intervertebral disc degeneration, lumbar region: Secondary | ICD-10-CM | POA: Diagnosis not present

## 2017-07-20 DIAGNOSIS — M545 Low back pain: Secondary | ICD-10-CM | POA: Diagnosis not present

## 2017-09-23 DIAGNOSIS — D692 Other nonthrombocytopenic purpura: Secondary | ICD-10-CM | POA: Diagnosis not present

## 2017-09-30 DIAGNOSIS — Z961 Presence of intraocular lens: Secondary | ICD-10-CM | POA: Diagnosis not present

## 2017-09-30 DIAGNOSIS — D3132 Benign neoplasm of left choroid: Secondary | ICD-10-CM | POA: Diagnosis not present

## 2017-09-30 DIAGNOSIS — H4089 Other specified glaucoma: Secondary | ICD-10-CM | POA: Diagnosis not present

## 2017-09-30 DIAGNOSIS — H401332 Pigmentary glaucoma, bilateral, moderate stage: Secondary | ICD-10-CM | POA: Diagnosis not present

## 2017-10-05 DIAGNOSIS — M545 Low back pain: Secondary | ICD-10-CM | POA: Diagnosis not present

## 2017-10-05 DIAGNOSIS — M47817 Spondylosis without myelopathy or radiculopathy, lumbosacral region: Secondary | ICD-10-CM | POA: Diagnosis not present

## 2017-10-08 DIAGNOSIS — M545 Low back pain: Secondary | ICD-10-CM | POA: Diagnosis not present

## 2017-10-25 DIAGNOSIS — J329 Chronic sinusitis, unspecified: Secondary | ICD-10-CM | POA: Diagnosis not present

## 2017-10-25 DIAGNOSIS — R062 Wheezing: Secondary | ICD-10-CM | POA: Diagnosis not present

## 2017-10-25 DIAGNOSIS — M5136 Other intervertebral disc degeneration, lumbar region: Secondary | ICD-10-CM | POA: Diagnosis not present

## 2017-10-25 DIAGNOSIS — M545 Low back pain: Secondary | ICD-10-CM | POA: Diagnosis not present

## 2017-10-25 DIAGNOSIS — M47817 Spondylosis without myelopathy or radiculopathy, lumbosacral region: Secondary | ICD-10-CM | POA: Diagnosis not present

## 2017-10-25 DIAGNOSIS — J301 Allergic rhinitis due to pollen: Secondary | ICD-10-CM | POA: Diagnosis not present

## 2017-11-09 DIAGNOSIS — E7800 Pure hypercholesterolemia, unspecified: Secondary | ICD-10-CM | POA: Diagnosis not present

## 2017-11-09 DIAGNOSIS — J309 Allergic rhinitis, unspecified: Secondary | ICD-10-CM | POA: Diagnosis not present

## 2017-11-09 DIAGNOSIS — Z23 Encounter for immunization: Secondary | ICD-10-CM | POA: Diagnosis not present

## 2017-11-09 DIAGNOSIS — J42 Unspecified chronic bronchitis: Secondary | ICD-10-CM | POA: Diagnosis not present

## 2017-11-09 DIAGNOSIS — Z86010 Personal history of colonic polyps: Secondary | ICD-10-CM | POA: Diagnosis not present

## 2017-11-09 DIAGNOSIS — K219 Gastro-esophageal reflux disease without esophagitis: Secondary | ICD-10-CM | POA: Diagnosis not present

## 2017-11-09 DIAGNOSIS — Z0000 Encounter for general adult medical examination without abnormal findings: Secondary | ICD-10-CM | POA: Diagnosis not present

## 2017-11-09 DIAGNOSIS — R030 Elevated blood-pressure reading, without diagnosis of hypertension: Secondary | ICD-10-CM | POA: Diagnosis not present

## 2017-11-09 DIAGNOSIS — F322 Major depressive disorder, single episode, severe without psychotic features: Secondary | ICD-10-CM | POA: Diagnosis not present

## 2017-11-10 ENCOUNTER — Other Ambulatory Visit: Payer: Self-pay | Admitting: Family Medicine

## 2017-11-10 DIAGNOSIS — Z1231 Encounter for screening mammogram for malignant neoplasm of breast: Secondary | ICD-10-CM

## 2017-11-23 ENCOUNTER — Telehealth (INDEPENDENT_AMBULATORY_CARE_PROVIDER_SITE_OTHER): Payer: Self-pay | Admitting: Orthopaedic Surgery

## 2017-11-23 NOTE — Telephone Encounter (Signed)
Noted  

## 2017-11-23 NOTE — Telephone Encounter (Signed)
Insurance info  [employer]   Patient called would like to sched appointment for Gel injection

## 2017-12-10 DIAGNOSIS — M47817 Spondylosis without myelopathy or radiculopathy, lumbosacral region: Secondary | ICD-10-CM | POA: Diagnosis not present

## 2017-12-10 DIAGNOSIS — M5136 Other intervertebral disc degeneration, lumbar region: Secondary | ICD-10-CM | POA: Diagnosis not present

## 2017-12-10 DIAGNOSIS — M545 Low back pain: Secondary | ICD-10-CM | POA: Diagnosis not present

## 2017-12-15 DIAGNOSIS — H6982 Other specified disorders of Eustachian tube, left ear: Secondary | ICD-10-CM | POA: Diagnosis not present

## 2017-12-15 DIAGNOSIS — H7202 Central perforation of tympanic membrane, left ear: Secondary | ICD-10-CM | POA: Diagnosis not present

## 2017-12-17 DIAGNOSIS — L570 Actinic keratosis: Secondary | ICD-10-CM | POA: Diagnosis not present

## 2017-12-24 ENCOUNTER — Ambulatory Visit
Admission: RE | Admit: 2017-12-24 | Discharge: 2017-12-24 | Disposition: A | Discharge status: Home / Self Care | Payer: Medicare HMO | Source: Ambulatory Visit | Attending: Family Medicine | Admitting: Family Medicine

## 2017-12-24 DIAGNOSIS — Z1231 Encounter for screening mammogram for malignant neoplasm of breast: Secondary | ICD-10-CM

## 2017-12-31 ENCOUNTER — Telehealth (INDEPENDENT_AMBULATORY_CARE_PROVIDER_SITE_OTHER): Payer: Self-pay

## 2017-12-31 NOTE — Telephone Encounter (Signed)
Submitted VOB for Monovisc, left knee. 

## 2018-01-07 ENCOUNTER — Telehealth (INDEPENDENT_AMBULATORY_CARE_PROVIDER_SITE_OTHER): Payer: Self-pay

## 2018-01-07 NOTE — Telephone Encounter (Signed)
Called and left a VM for patient to call back and schedule an appointment for gel injection with Dr. Ninfa Linden.  Patient is approved for Monovisc, left knee. [place] Patient will be responsible 20% of the allowable amount. No Co-pay No PA required

## 2018-01-18 DIAGNOSIS — M545 Low back pain: Secondary | ICD-10-CM | POA: Diagnosis not present

## 2018-01-18 DIAGNOSIS — M47817 Spondylosis without myelopathy or radiculopathy, lumbosacral region: Secondary | ICD-10-CM | POA: Diagnosis not present

## 2018-01-18 DIAGNOSIS — M5136 Other intervertebral disc degeneration, lumbar region: Secondary | ICD-10-CM | POA: Diagnosis not present

## 2018-01-19 ENCOUNTER — Encounter (INDEPENDENT_AMBULATORY_CARE_PROVIDER_SITE_OTHER): Payer: Self-pay | Admitting: Physician Assistant

## 2018-01-19 ENCOUNTER — Ambulatory Visit (INDEPENDENT_AMBULATORY_CARE_PROVIDER_SITE_OTHER): Payer: Medicare HMO | Admitting: Physician Assistant

## 2018-01-19 DIAGNOSIS — M1712 Unilateral primary osteoarthritis, left knee: Secondary | ICD-10-CM

## 2018-01-19 MED ORDER — HYALURONAN 88 MG/4ML IX SOSY
88.0000 mg | PREFILLED_SYRINGE | INTRA_ARTICULAR | Status: AC | PRN
  Administered 2018-01-19: 88 mg via INTRA_ARTICULAR

## 2018-01-19 NOTE — Progress Notes (Signed)
   Procedure Note  Patient: Michelle Johnston             Date of Birth: 1950-08-30           MRN: [id number]             Visit Date: 01/19/2018 HPI: Mrs. Common well-known to Dr. Ninfa Linden service comes in today with left knee pain.  She has known osteoarthritis left knee.  She last received a Monovisc injection in October 2018.  She states the injection really helped.  She comes in today for a repeat Monovisc injection she has had no known injury to the knee.  Physical exam: Left knee good range of motion.  No instability valgus varus stressing.  No effusion or abnormal warmth. Procedures: Visit Diagnoses: Unilateral primary osteoarthritis, left knee  Large Joint Inj on 01/19/2018 2:31 PM Indications: pain Details: 22 G 1.5 in needle, anterolateral approach  Arthrogram: No  Medications: 88 mg Hyaluronan 88 MG/4ML Outcome: tolerated well, no immediate complications Procedure, treatment alternatives, risks and benefits explained, specific risks discussed. Consent was given by the patient. Immediately prior to procedure a time out was called to verify the correct patient, procedure, equipment, support staff and site/side marked as required. Patient was prepped and draped in the usual sterile fashion.     Plan: She will follow-up on as-needed basis.  She understands that she can only have injections with Monovisc every 6 months.

## 2018-02-02 ENCOUNTER — Ambulatory Visit
Admission: RE | Admit: 2018-02-02 | Discharge: 2018-02-02 | Disposition: A | Discharge status: Home / Self Care | Payer: Medicare HMO | Source: Ambulatory Visit | Attending: Family Medicine | Admitting: Family Medicine

## 2018-02-02 DIAGNOSIS — Z1231 Encounter for screening mammogram for malignant neoplasm of breast: Secondary | ICD-10-CM | POA: Diagnosis not present

## 2018-02-10 DIAGNOSIS — J42 Unspecified chronic bronchitis: Secondary | ICD-10-CM | POA: Diagnosis not present

## 2018-02-10 DIAGNOSIS — R030 Elevated blood-pressure reading, without diagnosis of hypertension: Secondary | ICD-10-CM | POA: Diagnosis not present

## 2018-02-10 DIAGNOSIS — E7800 Pure hypercholesterolemia, unspecified: Secondary | ICD-10-CM | POA: Diagnosis not present

## 2018-03-09 DIAGNOSIS — H6983 Other specified disorders of Eustachian tube, bilateral: Secondary | ICD-10-CM | POA: Diagnosis not present

## 2018-03-09 DIAGNOSIS — H7202 Central perforation of tympanic membrane, left ear: Secondary | ICD-10-CM | POA: Diagnosis not present

## 2018-03-14 DIAGNOSIS — J019 Acute sinusitis, unspecified: Secondary | ICD-10-CM | POA: Diagnosis not present

## 2018-03-14 DIAGNOSIS — R0989 Other specified symptoms and signs involving the circulatory and respiratory systems: Secondary | ICD-10-CM | POA: Diagnosis not present

## 2018-04-07 DIAGNOSIS — D3132 Benign neoplasm of left choroid: Secondary | ICD-10-CM | POA: Diagnosis not present

## 2018-04-07 DIAGNOSIS — Z961 Presence of intraocular lens: Secondary | ICD-10-CM | POA: Diagnosis not present

## 2018-04-07 DIAGNOSIS — H401330 Pigmentary glaucoma, bilateral, stage unspecified: Secondary | ICD-10-CM | POA: Diagnosis not present

## 2018-05-03 DIAGNOSIS — J42 Unspecified chronic bronchitis: Secondary | ICD-10-CM | POA: Diagnosis not present

## 2018-05-20 ENCOUNTER — Ambulatory Visit: Payer: Medicare HMO | Admitting: Podiatry

## 2018-06-01 DIAGNOSIS — M47817 Spondylosis without myelopathy or radiculopathy, lumbosacral region: Secondary | ICD-10-CM | POA: Diagnosis not present

## 2018-06-01 DIAGNOSIS — M545 Low back pain: Secondary | ICD-10-CM | POA: Diagnosis not present

## 2018-07-20 ENCOUNTER — Ambulatory Visit: Payer: Medicare HMO | Admitting: Orthopaedic Surgery

## 2018-07-20 ENCOUNTER — Ambulatory Visit (INDEPENDENT_AMBULATORY_CARE_PROVIDER_SITE_OTHER): Payer: Medicare HMO

## 2018-07-20 ENCOUNTER — Other Ambulatory Visit: Payer: Self-pay

## 2018-07-20 ENCOUNTER — Encounter: Payer: Self-pay | Admitting: Orthopaedic Surgery

## 2018-07-20 DIAGNOSIS — G8929 Other chronic pain: Secondary | ICD-10-CM | POA: Diagnosis not present

## 2018-07-20 DIAGNOSIS — J301 Allergic rhinitis due to pollen: Secondary | ICD-10-CM | POA: Diagnosis not present

## 2018-07-20 DIAGNOSIS — M25562 Pain in left knee: Secondary | ICD-10-CM | POA: Diagnosis not present

## 2018-07-20 DIAGNOSIS — M25462 Effusion, left knee: Secondary | ICD-10-CM

## 2018-07-20 DIAGNOSIS — J320 Chronic maxillary sinusitis: Secondary | ICD-10-CM | POA: Diagnosis not present

## 2018-07-20 MED ORDER — METHYLPREDNISOLONE ACETATE 40 MG/ML IJ SUSP
40.0000 mg | INTRAMUSCULAR | Status: AC | PRN
  Administered 2018-07-20: 40 mg via INTRA_ARTICULAR

## 2018-07-20 MED ORDER — LIDOCAINE HCL 1 % IJ SOLN
3.0000 mL | INTRAMUSCULAR | Status: AC | PRN
  Administered 2018-07-20: 14:00:00 3 mL

## 2018-07-20 NOTE — Progress Notes (Signed)
Office Visit Note   Patient: Michelle Johnston           Date of Birth: 17-Oct-1950           MRN: [id number] Visit Date: 07/20/2018              Requested by: Mayra Neer, MD [address] [place], [place] [address] PCP: Mayra Neer, MD   Assessment & Plan: Visit Diagnoses:  1. Chronic pain of left knee   2. Effusion of left knee joint     Plan: We felt it be most appropriate to aspirate the fluid off of her knee today.  Artis Delay was able to draw 50 cc of serosanguineous fluid off of the knee and place a steroid injection in her knee and then place an Ace wrap to compress around this.  This gave her immediate relief and she felt good about her knee.  At this point follow-up can be as needed and I recommended she may try an over-the-counter knee sleeve if needed.  She has known significant arthritis in that knee and our next would be considering a knee replacement at some point if he gets to wear the knee is detrimentally affecting her mobility, her quality of life and her actives of daily living.  All question concerns were answered and addressed.  Follow-Up Instructions: Return if symptoms worsen or fail to improve.   Orders:  Orders Placed This Encounter  Procedures  . Large Joint Inj  . XR Knee 1-2 Views Left   No orders of the defined types were placed in this encounter.     Procedures: Large Joint Inj on 07/20/2018 2:18 PM Indications: diagnostic evaluation and pain Details: 22 G 1.5 in needle, superolateral approach  Arthrogram: No  Medications: 3 mL lidocaine 1 %; 40 mg methylPREDNISolone acetate 40 MG/ML Outcome: tolerated well, no immediate complications Procedure, treatment alternatives, risks and benefits explained, specific risks discussed. Consent was given by the patient. Immediately prior to procedure a time out was called to verify the correct patient, procedure, equipment, support staff and site/side marked as required. Patient was prepped and  draped in the usual sterile fashion.       Clinical Data: No additional findings.   Subjective: Chief Complaint  Patient presents with  . Left Knee - Pain  The patient is well-known to [place].  She does have known pre-existing left knee osteoarthritis.  She actually is getting out of bed yesterday and fell directly on her left knee landing on her kneecap with her knee bent back.  She is able to walk in without an assistive device.  She is an active 68 year old female but does report knee pain today that is worse than what it was before she fell yesterday as well as left knee swelling.  She denies any acute changes in her medical status otherwise or any other medical issues.  HPI  Review of Systems She currently denies any headache, chest pain, shortness of breath, fever, chills, nausea, vomiting  Objective: Vital Signs: There were no vitals taken for this visit.  Physical Exam She is alert and orient x3 and in no acute distress Ortho Exam Examination of her left knee shows a moderate knee joint effusion with no redness.  She can fully extend her knee and her extensor mechanism appears intact.  There is slight lateral joint line tenderness and some medial joint line tenderness and pain with blotting the patella but no instability ligamentously on exam. Specialty Comments:  No specialty comments available.  Imaging: Xr Knee 1-2 Views Left  Result Date: 07/20/2018 3 views of the left knee show a moderate joint effusion.  There is lateral compartment arthritic changes with joint space narrowing and para-articular osteophyte.  The medial compartment appears well-maintained.  There is no evidence of fracture.    PMFS History: Patient Active Problem List   Diagnosis Date Noted  . Unilateral primary osteoarthritis, left knee 11/30/2016  . Chronic pain of left knee 11/30/2016   Past Medical History:  Diagnosis Date  . Bradycardia   . Bronchitis   . Depression   . Glaucoma   .  Seasonal allergies   . Wears glasses     History reviewed. No pertinent family history.  Past Surgical History:  Procedure Laterality Date  . ABDOMINAL HYSTERECTOMY  1990  . BREAST BIOPSY Right    benign  . COLONOSCOPY    . NASAL SINUS SURGERY  1975  . ORIF ANKLE FRACTURE  2009   left  . TURBINATE REDUCTION Bilateral 06/04/2014   Procedure: BILATERAL TURBINATE REDUCTION;  Surgeon: Leta Baptist, MD;  Location: [facility];  Service: ENT;  Laterality: Bilateral;  . TYMPANOSTOMY TUBE PLACEMENT     left   Social History   Occupational History  . Not on file  Tobacco Use  . Smoking status: Former Smoker    Last attempt to quit: 05/28/1998    Years since quitting: 20.1  Substance and Sexual Activity  . Alcohol use: Yes    Comment: daily  . Drug use: No  . Sexual activity: Not on file

## 2018-07-30 ENCOUNTER — Other Ambulatory Visit: Payer: Medicare HMO

## 2018-07-30 ENCOUNTER — Telehealth: Payer: Self-pay | Admitting: Internal Medicine

## 2018-07-30 DIAGNOSIS — R6889 Other general symptoms and signs: Secondary | ICD-10-CM | POA: Diagnosis not present

## 2018-07-30 DIAGNOSIS — Z20822 Contact with and (suspected) exposure to covid-19: Secondary | ICD-10-CM

## 2018-07-30 NOTE — Telephone Encounter (Signed)
Spoke with the patient about possible Covid-19 exposure at 07/20/18 office visit to [facility].  RN explained the free Covid-19 screening offered for this exposure.  Notified that she and her driver should wear a masks and the drive-up screening will take place at the old [facility] on [facility].  Patient decided that she would like to come for an appointment on Monday, August 01, 2018 at 1300 or 1400.

## 2018-08-01 ENCOUNTER — Other Ambulatory Visit: Payer: Medicare HMO

## 2018-08-02 LAB — NOVEL CORONAVIRUS, NAA: SARS-CoV-2, NAA: NOT DETECTED

## 2018-08-11 DIAGNOSIS — Z881 Allergy status to other antibiotic agents status: Secondary | ICD-10-CM | POA: Diagnosis not present

## 2018-08-11 DIAGNOSIS — J329 Chronic sinusitis, unspecified: Secondary | ICD-10-CM | POA: Diagnosis not present

## 2018-08-11 DIAGNOSIS — Z9889 Other specified postprocedural states: Secondary | ICD-10-CM | POA: Diagnosis not present

## 2018-08-23 DIAGNOSIS — H9072 Mixed conductive and sensorineural hearing loss, unilateral, left ear, with unrestricted hearing on the contralateral side: Secondary | ICD-10-CM | POA: Diagnosis not present

## 2018-08-23 DIAGNOSIS — J343 Hypertrophy of nasal turbinates: Secondary | ICD-10-CM | POA: Diagnosis not present

## 2018-08-23 DIAGNOSIS — H6122 Impacted cerumen, left ear: Secondary | ICD-10-CM | POA: Diagnosis not present

## 2018-08-23 DIAGNOSIS — J310 Chronic rhinitis: Secondary | ICD-10-CM | POA: Diagnosis not present

## 2018-08-23 DIAGNOSIS — H7202 Central perforation of tympanic membrane, left ear: Secondary | ICD-10-CM | POA: Diagnosis not present

## 2018-10-04 ENCOUNTER — Encounter: Payer: Self-pay | Admitting: Physician Assistant

## 2018-10-04 ENCOUNTER — Ambulatory Visit (INDEPENDENT_AMBULATORY_CARE_PROVIDER_SITE_OTHER): Payer: Medicare HMO | Admitting: Physician Assistant

## 2018-10-04 ENCOUNTER — Other Ambulatory Visit: Payer: Self-pay

## 2018-10-04 DIAGNOSIS — M25462 Effusion, left knee: Secondary | ICD-10-CM

## 2018-10-04 DIAGNOSIS — M1712 Unilateral primary osteoarthritis, left knee: Secondary | ICD-10-CM

## 2018-10-04 MED ORDER — METHYLPREDNISOLONE ACETATE 40 MG/ML IJ SUSP
40.0000 mg | INTRAMUSCULAR | Status: AC | PRN
  Administered 2018-10-04: 16:00:00 40 mg via INTRA_ARTICULAR

## 2018-10-04 MED ORDER — LIDOCAINE HCL 1 % IJ SOLN
3.0000 mL | INTRAMUSCULAR | Status: AC | PRN
  Administered 2018-10-04: 16:00:00 3 mL

## 2018-10-04 NOTE — Progress Notes (Signed)
   Procedure Note  Patient: Michelle Johnston             Date of Birth: Mar 12, 1950           MRN: [id number]             Visit Date: 10/04/2018 HPI: [name] comes in today due to left knee pain.  She states she is been doing a lot of housework.  Otherwise had no known injury to the knee.  She does feel that she has fluid on the knee again.  She last saw Dr. Earlyne Iba on 07/20/2018 had aspiration of the knee and injection with cortisone and did well until recently.  She has known osteoarthritis left knee.  Physical exam: Left knee good range of motion.  Positive effusion no abnormal warmth erythema.  Procedures: Visit Diagnoses:  1. Effusion of left knee joint   2. Unilateral primary osteoarthritis, left knee     Large Joint Inj: L knee on 10/04/2018 3:30 PM Indications: pain Details: 22 G 1.5 in needle, superolateral approach  Arthrogram: No  Medications: 3 mL lidocaine 1 %; 40 mg methylPREDNISolone acetate 40 MG/ML Aspirate: 58 mL yellow Outcome: tolerated well, no immediate complications Procedure, treatment alternatives, risks and benefits explained, specific risks discussed. Consent was given by the patient. Immediately prior to procedure a time out was called to verify the correct patient, procedure, equipment, support staff and site/side marked as required. Patient was prepped and draped in the usual sterile fashion.     Plan: She will follow-up on an as-needed basis.  Did place an Ace wrap on her knee today she will leave this on until this evening then remove it.  She understands that she needs to wait at least 3 months between injections in the knee with cortisone.  Questions encouraged and answered

## 2018-11-03 DIAGNOSIS — D3132 Benign neoplasm of left choroid: Secondary | ICD-10-CM | POA: Diagnosis not present

## 2018-11-03 DIAGNOSIS — Z961 Presence of intraocular lens: Secondary | ICD-10-CM | POA: Diagnosis not present

## 2018-11-03 DIAGNOSIS — H401332 Pigmentary glaucoma, bilateral, moderate stage: Secondary | ICD-10-CM | POA: Diagnosis not present

## 2018-11-24 DIAGNOSIS — E7800 Pure hypercholesterolemia, unspecified: Secondary | ICD-10-CM | POA: Diagnosis not present

## 2018-11-28 DIAGNOSIS — R030 Elevated blood-pressure reading, without diagnosis of hypertension: Secondary | ICD-10-CM | POA: Diagnosis not present

## 2018-11-28 DIAGNOSIS — J42 Unspecified chronic bronchitis: Secondary | ICD-10-CM | POA: Diagnosis not present

## 2018-11-28 DIAGNOSIS — F322 Major depressive disorder, single episode, severe without psychotic features: Secondary | ICD-10-CM | POA: Diagnosis not present

## 2018-11-28 DIAGNOSIS — Z0000 Encounter for general adult medical examination without abnormal findings: Secondary | ICD-10-CM | POA: Diagnosis not present

## 2018-11-28 DIAGNOSIS — J309 Allergic rhinitis, unspecified: Secondary | ICD-10-CM | POA: Diagnosis not present

## 2018-11-28 DIAGNOSIS — K219 Gastro-esophageal reflux disease without esophagitis: Secondary | ICD-10-CM | POA: Diagnosis not present

## 2018-11-28 DIAGNOSIS — M4800 Spinal stenosis, site unspecified: Secondary | ICD-10-CM | POA: Diagnosis not present

## 2018-11-28 DIAGNOSIS — Z86010 Personal history of colonic polyps: Secondary | ICD-10-CM | POA: Diagnosis not present

## 2018-11-28 DIAGNOSIS — E7800 Pure hypercholesterolemia, unspecified: Secondary | ICD-10-CM | POA: Diagnosis not present

## 2018-12-21 ENCOUNTER — Other Ambulatory Visit: Payer: Self-pay

## 2018-12-21 ENCOUNTER — Ambulatory Visit (INDEPENDENT_AMBULATORY_CARE_PROVIDER_SITE_OTHER): Payer: Medicare HMO | Admitting: Orthopaedic Surgery

## 2018-12-21 ENCOUNTER — Encounter: Payer: Self-pay | Admitting: Orthopaedic Surgery

## 2018-12-21 DIAGNOSIS — M25462 Effusion, left knee: Secondary | ICD-10-CM

## 2018-12-21 DIAGNOSIS — M1712 Unilateral primary osteoarthritis, left knee: Secondary | ICD-10-CM | POA: Diagnosis not present

## 2018-12-21 DIAGNOSIS — G8929 Other chronic pain: Secondary | ICD-10-CM

## 2018-12-21 DIAGNOSIS — M25562 Pain in left knee: Secondary | ICD-10-CM | POA: Diagnosis not present

## 2018-12-21 MED ORDER — METHYLPREDNISOLONE ACETATE 40 MG/ML IJ SUSP
40.0000 mg | INTRAMUSCULAR | Status: AC | PRN
  Administered 2018-12-21: 10:00:00 40 mg via INTRA_ARTICULAR

## 2018-12-21 MED ORDER — LIDOCAINE HCL 1 % IJ SOLN
3.0000 mL | INTRAMUSCULAR | Status: AC | PRN
  Administered 2018-12-21: 10:00:00 3 mL

## 2018-12-21 NOTE — Progress Notes (Signed)
Office Visit Note   Patient: Michelle Johnston           Date of Birth: 1951/02/22           MRN: [id number] Visit Date: 12/21/2018              Requested by: Mayra Neer, MD [address] [place],  [place] [address] PCP: Mayra Neer, MD   Assessment & Plan: Visit Diagnoses:  1. Effusion of left knee joint   2. Unilateral primary osteoarthritis, left knee   3. Chronic pain of left knee     Plan: Today I did aspirate another 30 cc of clear fluid off the knee consistent with osteoarthritis.  I placed a steroid injection in the knee as well.  She would like to have surgery on her left knee in December and I agree with this completely.  Her left knee pain is daily and it is definitely affecting her mobility, her quality of life and her actives daily living.  She has tried failed conservative treatment for over 12 months.  Her x-rays confirm severe end-stage arthritis.  I showed her a knee model and we went over in detail what knee replacement surgery involves.  We talked about the risk and benefits of surgery.  I talked her about her interoperative and postoperative course and what to expect.  All question concerns were answered addressed.  We will work on getting surgery scheduled.  Follow-Up Instructions: Return for 2 weeks post-op.   Orders:  Orders Placed This Encounter  Procedures  . Large Joint Inj   No orders of the defined types were placed in this encounter.     Procedures: Large Joint Inj: L knee on 12/21/2018 10:00 AM Indications: diagnostic evaluation and pain Details: 22 G 1.5 in needle, superolateral approach  Arthrogram: No  Medications: 3 mL lidocaine 1 %; 40 mg methylPREDNISolone acetate 40 MG/ML Outcome: tolerated well, no immediate complications Procedure, treatment alternatives, risks and benefits explained, specific risks discussed. Consent was given by the patient. Immediately prior to procedure a time out was called to verify the correct  patient, procedure, equipment, support staff and site/side marked as required. Patient was prepped and draped in the usual sterile fashion.       Clinical Data: No additional findings.   Subjective: Chief Complaint  Patient presents with  . Left Knee - Pain  The patient is well-known to [place].  She has known severe osteoarthritis and degenerative joint disease of her left knee with recurrent effusions.  She has tried and failed conservative treatment now for well over a year.  She has worsening swelling in her knee.  We have drained an effusion of the knee multiple times in place steroids in her knee.  She is gotten to the point where her left knee pain is definitely affecting her actives daily living, her quality of life and her mobility.  She does have the knee buckle and give way on her.  She is not had any falls but she is becoming a fall risk she states.  At this point her pain is 10 out of 10.  She would like to proceed with knee replacement surgery in the near future.  HPI  Review of Systems She currently denies any headache, chest pain, shortness of breath, fever, chills, nausea, vomiting  Objective: Vital Signs: There were no vitals taken for this visit.  Physical Exam She is alert and oriented x3 and in no acute distress Ortho  Exam Examination of her left knee shows a moderate effusion.  There is significant patellofemoral crepitation and severe lateral joint line pain.  Her range of motion is full. Specialty Comments:  No specialty comments available.  Imaging: No results found. X-rays of her left knee earlier this year do show significant tricompartment arthritis of the left knee with slight valgus malalignment.  There are periarticular osteophytes in all 3 compartments there is significant patellofemoral disease.  PMFS History: Patient Active Problem List   Diagnosis Date Noted  . Unilateral primary osteoarthritis, left knee 11/30/2016  . Chronic pain of left knee  11/30/2016   Past Medical History:  Diagnosis Date  . Bradycardia   . Bronchitis   . Depression   . Glaucoma   . Seasonal allergies   . Wears glasses     History reviewed. No pertinent family history.  Past Surgical History:  Procedure Laterality Date  . ABDOMINAL HYSTERECTOMY  1990  . BREAST BIOPSY Right    benign  . COLONOSCOPY    . NASAL SINUS SURGERY  1975  . ORIF ANKLE FRACTURE  2009   left  . TURBINATE REDUCTION Bilateral 06/04/2014   Procedure: BILATERAL TURBINATE REDUCTION;  Surgeon: Leta Baptist, MD;  Location: [place] [facility];  Service: ENT;  Laterality: Bilateral;  . TYMPANOSTOMY TUBE PLACEMENT     left   Social History   Occupational History  . Not on file  Tobacco Use  . Smoking status: Former Smoker    Quit date: 05/28/1998    Years since quitting: 20.5  Substance and Sexual Activity  . Alcohol use: Yes    Comment: daily  . Drug use: No  . Sexual activity: Not on file

## 2018-12-22 DIAGNOSIS — M545 Low back pain: Secondary | ICD-10-CM | POA: Diagnosis not present

## 2018-12-22 DIAGNOSIS — M47817 Spondylosis without myelopathy or radiculopathy, lumbosacral region: Secondary | ICD-10-CM | POA: Diagnosis not present

## 2018-12-29 DIAGNOSIS — M545 Low back pain: Secondary | ICD-10-CM | POA: Diagnosis not present

## 2018-12-29 DIAGNOSIS — M5136 Other intervertebral disc degeneration, lumbar region: Secondary | ICD-10-CM | POA: Diagnosis not present

## 2018-12-29 DIAGNOSIS — M47817 Spondylosis without myelopathy or radiculopathy, lumbosacral region: Secondary | ICD-10-CM | POA: Diagnosis not present

## 2019-01-16 ENCOUNTER — Other Ambulatory Visit: Payer: Self-pay

## 2019-01-19 DIAGNOSIS — M47817 Spondylosis without myelopathy or radiculopathy, lumbosacral region: Secondary | ICD-10-CM | POA: Diagnosis not present

## 2019-01-19 DIAGNOSIS — M545 Low back pain: Secondary | ICD-10-CM | POA: Diagnosis not present

## 2019-01-24 ENCOUNTER — Other Ambulatory Visit: Payer: Self-pay | Admitting: Physician Assistant

## 2019-01-31 ENCOUNTER — Other Ambulatory Visit (HOSPITAL_COMMUNITY)
Admission: RE | Admit: 2019-01-31 | Discharge: 2019-01-31 | Disposition: A | Discharge status: Home / Self Care | Payer: Medicare HMO | Source: Ambulatory Visit | Attending: Orthopaedic Surgery | Admitting: Orthopaedic Surgery

## 2019-01-31 DIAGNOSIS — Z01812 Encounter for preprocedural laboratory examination: Secondary | ICD-10-CM | POA: Insufficient documentation

## 2019-01-31 DIAGNOSIS — Z20828 Contact with and (suspected) exposure to other viral communicable diseases: Secondary | ICD-10-CM | POA: Diagnosis not present

## 2019-01-31 NOTE — Patient Instructions (Addendum)
DUE TO COVID-19 ONLY ONE VISITOR IS ALLOWED TO COME WITH YOU AND STAY IN THE WAITING ROOM ONLY DURING PRE OP AND PROCEDURE DAY OF SURGERY. THE 1 VISITOR MAY VISIT WITH YOU AFTER SURGERY IN YOUR PRIVATE ROOM DURING VISITING HOURS ONLY!   ONCE YOUR COVID TEST IS COMPLETED, PLEASE BEGIN THE QUARANTINE INSTRUCTIONS AS OUTLINED IN YOUR HANDOUT.                EVIANA GUNNELL     Your procedure is scheduled on: Friday 02/03/2019   Report to [facility] Main  Entrance    Report to admitting at   1100 AM     Call this number if you have problems the morning of surgery [phone]    Remember: Do not eat food :After Midnight.     NO SOLID FOOD AFTER MIDNIGHT THE NIGHT PRIOR TO SURGERY. NOTHING BY MOUTH EXCEPT CLEAR LIQUIDS UNTIL 1030 am .     PLEASE FINISH ENSURE DRINK PER SURGEON ORDER  WHICH NEEDS TO BE COMPLETED AT  1030 am.   CLEAR LIQUID DIET   Foods Allowed                                                                     Foods Excluded  Coffee and tea, regular and decaf                             liquids that you cannot  Plain Jell-O any favor except red or purple                                           see through such as: Fruit ices (not with fruit pulp)                                     milk, soups, orange juice  Iced Popsicles                                    All solid food Carbonated beverages, regular and diet                                    Cranberry, grape and apple juices Sports drinks like Gatorade Lightly seasoned clear broth or consume(fat free) Sugar, honey syrup  Sample Menu Breakfast                                Lunch                                     Supper Cranberry juice                    Beef broth  Chicken broth Jell-O                                     Grape juice                           Apple juice Coffee or tea                        Jell-O                                      Popsicle                                   Coffee or tea                        Coffee or tea  _____________________________________________________________________     BRUSH YOUR TEETH MORNING OF SURGERY AND RINSE YOUR MOUTH OUT, NO CHEWING GUM CANDY OR MINTS.     Take these medicines the morning of surgery with A SIP OF WATER: Fluoxetine (Prozac),   use Albuterol inhaler and Advair inhaler if needed and bring inhalers with you to the hospital                                 You may not have any metal on your body including hair pins and              piercings  Do not wear jewelry, make-up, lotions, powders or perfumes, deodorant             Do not wear nail polish on your fingernails.  Do not shave  48 hours prior to surgery.              Do not bring valuables to the hospital. [place].  Contacts, dentures or bridgework may not be worn into surgery.  Leave suitcase in the car. After surgery it may be brought to your room.                  Please read over the following fact sheets you were given: _____________________________________________________________________             [facility] - Preparing for Surgery Before surgery, you can play an important role.  Because skin is not sterile, your skin needs to be as free of germs as possible.  You can reduce the number of germs on your skin by washing with CHG (chlorahexidine gluconate) soap before surgery.  CHG is an antiseptic cleaner which kills germs and bonds with the skin to continue killing germs even after washing. Please DO NOT use if you have an allergy to CHG or antibacterial soaps.  If your skin becomes reddened/irritated stop using the CHG and inform your nurse when you arrive at Short Stay. Do not shave (including legs and underarms) for at least 48 hours prior to the first CHG shower.  You may shave your face/neck. Please follow these instructions carefully:  1.  Shower with  CHG Soap the night before surgery and the  morning of Surgery.  2.  If you choose to wash your hair, wash your hair first as usual with your  normal  shampoo.  3.  After you shampoo, rinse your hair and body thoroughly to remove the  shampoo.                           4.  Use CHG as you would any other liquid soap.  You can apply chg directly  to the skin and wash                       Gently with a scrungie or clean washcloth.  5.  Apply the CHG Soap to your body ONLY FROM THE NECK DOWN.   Do not use on face/ open                           Wound or open sores. Avoid contact with eyes, ears mouth and genitals (private parts).                       Wash face,  Genitals (private parts) with your normal soap.             6.  Wash thoroughly, paying special attention to the area where your surgery  will be performed.  7.  Thoroughly rinse your body with warm water from the neck down.  8.  DO NOT shower/wash with your normal soap after using and rinsing off  the CHG Soap.                9.  Pat yourself dry with a clean towel.            10.  Wear clean pajamas.            11.  Place clean sheets on your bed the night of your first shower and do not  sleep with pets. Day of Surgery : Do not apply any lotions/deodorants the morning of surgery.  Please wear clean clothes to the hospital/surgery center.  FAILURE TO FOLLOW THESE INSTRUCTIONS MAY RESULT IN THE CANCELLATION OF YOUR SURGERY PATIENT SIGNATURE_________________________________  NURSE SIGNATURE__________________________________  ________________________________________________________________________   Adam Phenix  An incentive spirometer is a tool that can help keep your lungs clear and active. This tool measures how well you are filling your lungs with each breath. Taking long deep breaths may help reverse or decrease the chance of developing breathing (pulmonary) problems (especially infection) following:  A long period of  time when you are unable to move or be active. BEFORE THE PROCEDURE   If the spirometer includes an indicator to show your best effort, your nurse or respiratory therapist will set it to a desired goal.  If possible, sit up straight or lean slightly forward. Try not to slouch.  Hold the incentive spirometer in an upright position. INSTRUCTIONS FOR USE  1. Sit on the edge of your bed if possible, or sit up as far as you can in bed or on a chair. 2. Hold the incentive spirometer in an upright position. 3. Breathe out normally. 4. Place the mouthpiece in your mouth and seal your lips tightly around it. 5. Breathe in slowly and as deeply as possible, raising the piston or the ball toward the top of the column. 6.  Hold your breath for 3-5 seconds or for as long as possible. Allow the piston or ball to fall to the bottom of the column. 7. Remove the mouthpiece from your mouth and breathe out normally. 8. Rest for a few seconds and repeat Steps 1 through 7 at least 10 times every 1-2 hours when you are awake. Take your time and take a few normal breaths between deep breaths. 9. The spirometer may include an indicator to show your best effort. Use the indicator as a goal to work toward during each repetition. 10. After each set of 10 deep breaths, practice coughing to be sure your lungs are clear. If you have an incision (the cut made at the time of surgery), support your incision when coughing by placing a pillow or rolled up towels firmly against it. Once you are able to get out of bed, walk around indoors and cough well. You may stop using the incentive spirometer when instructed by your caregiver.  RISKS AND COMPLICATIONS  Take your time so you do not get dizzy or light-headed.  If you are in pain, you may need to take or ask for pain medication before doing incentive spirometry. It is harder to take a deep breath if you are having pain. AFTER USE  Rest and breathe slowly and easily.  It can  be helpful to keep track of a log of your progress. Your caregiver can provide you with a simple table to help with this. If you are using the spirometer at home, follow these instructions: [place] IF:   You are having difficultly using the spirometer.  You have trouble using the spirometer as often as instructed.  Your pain medication is not giving enough relief while using the spirometer.  You develop fever of 100.5 F (38.1 C) or higher. SEEK IMMEDIATE MEDICAL CARE IF:   You cough up bloody sputum that had not been present before.  You develop fever of 102 F (38.9 C) or greater.  You develop worsening pain at or near the incision site. MAKE SURE YOU:   Understand these instructions.  Will watch your condition.  Will get help right away if you are not doing well or get worse. Document Released: 06/29/2006 Document Revised: 05/11/2011 Document Reviewed: 08/30/2006 [facility] Patient Information 2014 [place], [place].   ________________________________________________________________________

## 2019-02-02 ENCOUNTER — Other Ambulatory Visit: Payer: Self-pay

## 2019-02-02 ENCOUNTER — Encounter (HOSPITAL_COMMUNITY)
Admission: RE | Admit: 2019-02-02 | Discharge: 2019-02-02 | Disposition: A | Discharge status: Home / Self Care | Payer: Medicare HMO | Source: Ambulatory Visit | Attending: Orthopaedic Surgery | Admitting: Orthopaedic Surgery

## 2019-02-02 ENCOUNTER — Encounter (HOSPITAL_COMMUNITY): Payer: Self-pay

## 2019-02-02 ENCOUNTER — Other Ambulatory Visit (HOSPITAL_COMMUNITY): Payer: Self-pay | Admitting: *Deleted

## 2019-02-02 DIAGNOSIS — Z01810 Encounter for preprocedural cardiovascular examination: Secondary | ICD-10-CM | POA: Diagnosis not present

## 2019-02-02 DIAGNOSIS — Z01812 Encounter for preprocedural laboratory examination: Secondary | ICD-10-CM | POA: Diagnosis not present

## 2019-02-02 DIAGNOSIS — I10 Essential (primary) hypertension: Secondary | ICD-10-CM | POA: Insufficient documentation

## 2019-02-02 HISTORY — DX: Essential (primary) hypertension: I10

## 2019-02-02 HISTORY — DX: Unspecified osteoarthritis, unspecified site: M19.90

## 2019-02-02 LAB — BASIC METABOLIC PANEL
Anion gap: 10 (ref 5–15)
BUN: 20 mg/dL (ref 8–23)
CO2: 25 mmol/L (ref 22–32)
Calcium: 9.5 mg/dL (ref 8.9–10.3)
Chloride: 100 mmol/L (ref 98–111)
Creatinine, Ser: 0.51 mg/dL (ref 0.44–1.00)
GFR calc Af Amer: >60 mL/min (ref >60)
GFR calc non Af Amer: >60 mL/min (ref >60)
Glucose, Bld: 101 mg/dL — ABNORMAL HIGH (ref 70–99)
Potassium: 4.4 mmol/L (ref 3.5–5.1)
Sodium: 135 mmol/L (ref 135–145)

## 2019-02-02 LAB — SURGICAL PCR SCREEN
MRSA, PCR: NEGATIVE
Staphylococcus aureus: NEGATIVE

## 2019-02-02 LAB — CBC
HCT: 38.6 % (ref 36.0–46.0)
Hemoglobin: 13 g/dL (ref 12.0–15.0)
MCH: 33.8 pg (ref 26.0–34.0)
MCHC: 33.7 g/dL (ref 30.0–36.0)
MCV: 100.3 fL — ABNORMAL HIGH (ref 80.0–100.0)
Platelets: 260 10*3/uL (ref 150–400)
RBC: 3.85 MIL/uL — ABNORMAL LOW (ref 3.87–5.11)
RDW: 12.4 % (ref 11.5–15.5)
WBC: 4 10*3/uL (ref 4.0–10.5)
nRBC: 0 % (ref 0.0–0.2)

## 2019-02-02 LAB — NOVEL CORONAVIRUS, NAA (HOSP ORDER, SEND-OUT TO REF LAB; TAT 18-24 HRS): SARS-CoV-2, NAA: NOT DETECTED

## 2019-02-02 NOTE — H&P (Signed)
TOTAL KNEE ADMISSION H&P  Patient is being admitted for left total knee arthroplasty.  Subjective:  Chief Complaint:left knee pain.  HPI: Michelle Johnston, 68 y.o. female, has a history of pain and functional disability in the left knee due to arthritis and has failed non-surgical conservative treatments for greater than 12 weeks to includeNSAID's and/or analgesics, corticosteriod injections, viscosupplementation injections, flexibility and strengthening excercises, supervised PT with diminished ADL's post treatment and activity modification.  Onset of symptoms was gradual, starting 3 years ago with gradually worsening course since that time. The patient noted no past surgery on the left knee(s).  Patient currently rates pain in the left knee(s) at 10 out of 10 with activity. Patient has night pain, worsening of pain with activity and weight bearing, pain that interferes with activities of daily living, pain with passive range of motion, crepitus and joint swelling.  Patient has evidence of subchondral sclerosis, periarticular osteophytes and joint space narrowing by imaging studies. There is no active infection.  Patient Active Problem List   Diagnosis Date Noted  . Unilateral primary osteoarthritis, left knee 11/30/2016  . Chronic pain of left knee 11/30/2016   Past Medical History:  Diagnosis Date  . Arthritis   . Bradycardia   . Bronchitis   . Depression   . Glaucoma   . Hypertension   . Seasonal allergies   . Wears glasses     Past Surgical History:  Procedure Laterality Date  . ABDOMINAL HYSTERECTOMY  1990  . BREAST BIOPSY Right    benign  . COLONOSCOPY    . meniscus tear knee     left knee  . NASAL SINUS SURGERY  1975  . ORIF ANKLE FRACTURE  2009   left  . TURBINATE REDUCTION Bilateral 06/04/2014   Procedure: BILATERAL TURBINATE REDUCTION;  Surgeon: Leta Baptist, MD;  Location: [place];  Service: ENT;  Laterality: Bilateral;  . TYMPANOSTOMY TUBE PLACEMENT     left     No current facility-administered medications for this encounter.    Current Outpatient Medications  Medication Sig Dispense Refill Last Dose  . acetaminophen (TYLENOL) 500 MG tablet Take 1,000 mg by mouth every 6 (six) hours as needed. pain     . albuterol (VENTOLIN HFA) 108 (90 Base) MCG/ACT inhaler Inhale 1-2 puffs into the lungs every 6 (six) hours as needed for wheezing or shortness of breath.     Marland Kitchen amLODipine (NORVASC) 5 MG tablet Take 2.5 mg by mouth at bedtime.      Marland Kitchen b complex vitamins tablet Take 1 tablet by mouth daily.     . calcium carbonate (OS-CAL - DOSED IN MG OF ELEMENTAL CALCIUM) 1250 (500 Ca) MG tablet Take 1 tablet by mouth daily with breakfast.     . cetirizine (ZYRTEC) 10 MG tablet Take 10 mg by mouth at bedtime.     . cholecalciferol (VITAMIN D) 1000 UNITS tablet Take 1,000 Units by mouth daily.     Marland Kitchen FLUoxetine (PROZAC) 20 MG capsule Take 40 mg by mouth daily.      . Fluticasone-Salmeterol (ADVAIR) 250-50 MCG/DOSE AEPB Inhale 1 puff into the lungs 2 (two) times daily as needed (asthma).     . guaiFENesin (MUCINEX) 600 MG 12 hr tablet Take 600 mg by mouth daily as needed.      . latanoprost (XALATAN) 0.005 % ophthalmic solution Place 1 drop into both eyes at bedtime.     . meloxicam (MOBIC) 15 MG tablet Take 15 mg by mouth  daily.     . montelukast (SINGULAIR) 10 MG tablet Take 10 mg by mouth at bedtime.     . Multiple Vitamins-Minerals (MULTIVITAMIN WITH MINERALS) tablet Take 1 tablet by mouth daily.     . Omega-3 Fatty Acids (FISH OIL) 1000 MG CAPS Take 1,000 mg by mouth daily.     . Probiotic Product (PROBIOTIC DAILY PO) Take 1 capsule by mouth daily.     . vitamin C (ASCORBIC ACID) 500 MG tablet Take 500 mg by mouth daily.     . vitamin E 400 UNIT capsule Take 400 Units by mouth daily.      Allergies  Allergen Reactions  . Ceclor [Cefaclor] Hives  . Ciprofloxacin Hives  . Flagyl [Metronidazole] Hives    Social History   Tobacco Use  . Smoking status:  Former Smoker    Quit date: 05/28/1998    Years since quitting: 20.6  . Smokeless tobacco: Never Used  Substance Use Topics  . Alcohol use: Yes    Comment: daily-wine daily-1 glass    No family history on file.   Review of Systems  Musculoskeletal: Positive for joint pain.  All other systems reviewed and are negative.   Objective:  Physical Exam  Constitutional: She is oriented to person, place, and time. She appears well-developed and well-nourished.  HENT:  Head: Normocephalic and atraumatic.  Eyes: Pupils are equal, round, and reactive to light. EOM are normal.  Neck: Normal range of motion. Neck supple.  Cardiovascular: Normal rate and regular rhythm.  Respiratory: Effort normal and breath sounds normal.  GI: Soft. Bowel sounds are normal.  Musculoskeletal:     Left knee: She exhibits decreased range of motion, effusion, abnormal alignment, bony tenderness and abnormal meniscus. Tenderness found. Medial joint line and lateral joint line tenderness noted.  Neurological: She is alert and oriented to person, place, and time.  Skin: Skin is warm and dry.  Psychiatric: She has a normal mood and affect.    Vital signs in last 24 hours: Temp:  [99.2 F (37.3 C)] 99.2 F (37.3 C) (12/03 1028) Pulse Rate:  [57] 57 (12/03 1028) Resp:  [18] 18 (12/03 1028) BP: (156-163)/(78-87) 163/87 (12/03 1055) SpO2:  [99 %] 99 % (12/03 1028) Weight:  [66.7 kg] 66.7 kg (12/03 1028)  Labs:   Estimated body mass index is 24.46 kg/m as calculated from the following:   Height as of 02/02/19: 5\' 5"  (1.651 m).   Weight as of 02/02/19: 66.7 kg.   Imaging Review Plain radiographs demonstrate severe degenerative joint disease of the left knee(s). The overall alignment ismild valgus. The bone quality appears to be excellent for age and reported activity level.      Assessment/Plan:  End stage arthritis, left knee   The patient history, physical examination, clinical judgment of the  provider and imaging studies are consistent with end stage degenerative joint disease of the left knee(s) and total knee arthroplasty is deemed medically necessary. The treatment options including medical management, injection therapy arthroscopy and arthroplasty were discussed at length. The risks and benefits of total knee arthroplasty were presented and reviewed. The risks due to aseptic loosening, infection, stiffness, patella tracking problems, thromboembolic complications and other imponderables were discussed. The patient acknowledged the explanation, agreed to proceed with the plan and consent was signed. Patient is being admitted for inpatient treatment for surgery, pain control, PT, OT, prophylactic antibiotics, VTE prophylaxis, progressive ambulation and ADL's and discharge planning. The patient is planning to be discharged home with  home health services

## 2019-02-02 NOTE — Progress Notes (Signed)
PCP - Dr. Mayra Neer Cardiologist - N/A  Chest x-ray - N/A EKG - N/A Stress Test -N/A ECHO - N/A Cardiac Cath - N/A  Sleep Study -N/A  CPAP - N/A  Fasting Blood Sugar -N/A  Checks Blood Sugar _0____ times a day  Blood Thinner Instructions:N/A Aspirin Instructions:N/A Last Dose:N/A  Anesthesia review:   Patient has history of HTN and glaucoma.  Patient denies shortness of breath, fever, cough and chest pain at PAT appointment   Patient verbalized understanding of instructions that were given to them at the PAT appointment. Patient was also instructed that they will need to review over the PAT instructions again at home before surgery.

## 2019-02-03 ENCOUNTER — Ambulatory Visit (HOSPITAL_COMMUNITY): Payer: Medicare HMO | Admitting: Certified Registered"

## 2019-02-03 ENCOUNTER — Ambulatory Visit (HOSPITAL_COMMUNITY): Payer: Medicare HMO | Admitting: Physician Assistant

## 2019-02-03 ENCOUNTER — Observation Stay (HOSPITAL_COMMUNITY): Payer: Medicare HMO

## 2019-02-03 ENCOUNTER — Encounter (HOSPITAL_COMMUNITY): Payer: Self-pay

## 2019-02-03 ENCOUNTER — Encounter (HOSPITAL_COMMUNITY)
Admission: RE | Disposition: A | Discharge status: Home Health | Payer: Self-pay | Source: Home / Self Care | Attending: Orthopaedic Surgery

## 2019-02-03 ENCOUNTER — Observation Stay (HOSPITAL_COMMUNITY)
Admission: RE | Admit: 2019-02-03 | Discharge: 2019-02-04 | Disposition: A | Discharge status: Home Health | Payer: Medicare HMO | Attending: Orthopaedic Surgery | Admitting: Orthopaedic Surgery

## 2019-02-03 ENCOUNTER — Other Ambulatory Visit: Payer: Self-pay

## 2019-02-03 DIAGNOSIS — Z79899 Other long term (current) drug therapy: Secondary | ICD-10-CM | POA: Diagnosis not present

## 2019-02-03 DIAGNOSIS — Z7951 Long term (current) use of inhaled steroids: Secondary | ICD-10-CM | POA: Diagnosis not present

## 2019-02-03 DIAGNOSIS — M25462 Effusion, left knee: Secondary | ICD-10-CM | POA: Insufficient documentation

## 2019-02-03 DIAGNOSIS — I10 Essential (primary) hypertension: Secondary | ICD-10-CM | POA: Diagnosis not present

## 2019-02-03 DIAGNOSIS — Z881 Allergy status to other antibiotic agents status: Secondary | ICD-10-CM | POA: Diagnosis not present

## 2019-02-03 DIAGNOSIS — M1712 Unilateral primary osteoarthritis, left knee: Principal | ICD-10-CM | POA: Insufficient documentation

## 2019-02-03 DIAGNOSIS — Z87891 Personal history of nicotine dependence: Secondary | ICD-10-CM | POA: Insufficient documentation

## 2019-02-03 DIAGNOSIS — F329 Major depressive disorder, single episode, unspecified: Secondary | ICD-10-CM | POA: Insufficient documentation

## 2019-02-03 DIAGNOSIS — Z791 Long term (current) use of non-steroidal anti-inflammatories (NSAID): Secondary | ICD-10-CM | POA: Insufficient documentation

## 2019-02-03 DIAGNOSIS — M25562 Pain in left knee: Secondary | ICD-10-CM | POA: Diagnosis not present

## 2019-02-03 DIAGNOSIS — Z96652 Presence of left artificial knee joint: Secondary | ICD-10-CM

## 2019-02-03 DIAGNOSIS — H409 Unspecified glaucoma: Secondary | ICD-10-CM | POA: Insufficient documentation

## 2019-02-03 DIAGNOSIS — S8992XA Unspecified injury of left lower leg, initial encounter: Secondary | ICD-10-CM | POA: Diagnosis not present

## 2019-02-03 DIAGNOSIS — G8918 Other acute postprocedural pain: Secondary | ICD-10-CM | POA: Diagnosis not present

## 2019-02-03 HISTORY — PX: TOTAL KNEE ARTHROPLASTY: SHX125

## 2019-02-03 SURGERY — ARTHROPLASTY, KNEE, TOTAL
Anesthesia: Spinal | Site: Knee | Laterality: Left

## 2019-02-03 MED ORDER — VITAMIN D3 25 MCG PO TABS
1000.0000 [IU] | ORAL_TABLET | Freq: Every day | ORAL | Status: DC
  Administered 2019-02-04: 1000 [IU] via ORAL
  Filled 2019-02-03 (×2): qty 1

## 2019-02-03 MED ORDER — EPHEDRINE SULFATE-NACL 50-0.9 MG/10ML-% IV SOSY
PREFILLED_SYRINGE | INTRAVENOUS | Status: DC | PRN
  Administered 2019-02-03 (×2): 10 mg via INTRAVENOUS

## 2019-02-03 MED ORDER — ALBUTEROL SULFATE (2.5 MG/3ML) 0.083% IN NEBU: 2.5000 mg | INHALATION_SOLUTION | Freq: Four times a day (QID) | RESPIRATORY_TRACT | Status: DC | PRN

## 2019-02-03 MED ORDER — DEXAMETHASONE SODIUM PHOSPHATE 10 MG/ML IJ SOLN
INTRAMUSCULAR | Status: AC
  Filled 2019-02-03: qty 1

## 2019-02-03 MED ORDER — MEPERIDINE HCL 50 MG/ML IJ SOLN: 6.2500 mg | INTRAMUSCULAR | Status: DC | PRN

## 2019-02-03 MED ORDER — GABAPENTIN 100 MG PO CAPS
100.0000 mg | ORAL_CAPSULE | Freq: Three times a day (TID) | ORAL | Status: DC
  Administered 2019-02-03 – 2019-02-04 (×3): 100 mg via ORAL
  Filled 2019-02-03 (×3): qty 1

## 2019-02-03 MED ORDER — VITAMIN C 500 MG PO TABS
500.0000 mg | ORAL_TABLET | Freq: Every day | ORAL | Status: DC
  Administered 2019-02-04: 500 mg via ORAL
  Filled 2019-02-03: qty 1

## 2019-02-03 MED ORDER — DIPHENHYDRAMINE HCL 12.5 MG/5ML PO ELIX: 12.5000 mg | ORAL_SOLUTION | ORAL | Status: DC | PRN

## 2019-02-03 MED ORDER — HYDROMORPHONE HCL 1 MG/ML IJ SOLN: 0.2500 mg | INTRAMUSCULAR | Status: DC | PRN

## 2019-02-03 MED ORDER — PANTOPRAZOLE SODIUM 40 MG PO TBEC
40.0000 mg | DELAYED_RELEASE_TABLET | Freq: Every day | ORAL | Status: DC
  Administered 2019-02-04: 40 mg via ORAL
  Filled 2019-02-03: qty 1

## 2019-02-03 MED ORDER — BUPIVACAINE IN DEXTROSE 0.75-8.25 % IT SOLN
INTRATHECAL | Status: DC | PRN
  Administered 2019-02-03: 1.8 mL via INTRATHECAL

## 2019-02-03 MED ORDER — LIDOCAINE 2% (20 MG/ML) 5 ML SYRINGE
INTRAMUSCULAR | Status: DC | PRN
  Administered 2019-02-03: 50 mg via INTRAVENOUS

## 2019-02-03 MED ORDER — SODIUM CHLORIDE 0.9 % IV SOLN
INTRAVENOUS | Status: DC
  Administered 2019-02-03 – 2019-02-04 (×2): via INTRAVENOUS

## 2019-02-03 MED ORDER — MENTHOL 3 MG MT LOZG: 1.0000 | LOZENGE | OROMUCOSAL | Status: DC | PRN

## 2019-02-03 MED ORDER — HYDROMORPHONE HCL 1 MG/ML IJ SOLN
0.5000 mg | INTRAMUSCULAR | Status: DC | PRN
  Administered 2019-02-03 – 2019-02-04 (×3): 1 mg via INTRAVENOUS
  Filled 2019-02-03 (×2): qty 1

## 2019-02-03 MED ORDER — MOMETASONE FURO-FORMOTEROL FUM 200-5 MCG/ACT IN AERO
2.0000 | INHALATION_SPRAY | Freq: Two times a day (BID) | RESPIRATORY_TRACT | Status: DC | PRN
  Filled 2019-02-03: qty 8.8

## 2019-02-03 MED ORDER — HYDROMORPHONE HCL 2 MG PO TABS
1.0000 mg | ORAL_TABLET | ORAL | Status: DC | PRN
  Administered 2019-02-03 – 2019-02-04 (×4): 1 mg via ORAL
  Filled 2019-02-03 (×5): qty 1

## 2019-02-03 MED ORDER — ACETAMINOPHEN 325 MG PO TABS: 325.0000 mg | ORAL_TABLET | Freq: Four times a day (QID) | ORAL | Status: DC | PRN

## 2019-02-03 MED ORDER — METHOCARBAMOL 500 MG PO TABS
500.0000 mg | ORAL_TABLET | Freq: Four times a day (QID) | ORAL | Status: DC | PRN
  Administered 2019-02-04 (×2): 500 mg via ORAL
  Filled 2019-02-03 (×2): qty 1

## 2019-02-03 MED ORDER — ASPIRIN 81 MG PO CHEW
81.0000 mg | CHEWABLE_TABLET | Freq: Two times a day (BID) | ORAL | Status: DC
  Administered 2019-02-03 – 2019-02-04 (×2): 81 mg via ORAL
  Filled 2019-02-03 (×2): qty 1

## 2019-02-03 MED ORDER — CLINDAMYCIN PHOSPHATE 900 MG/50ML IV SOLN
900.0000 mg | INTRAVENOUS | Status: AC
  Administered 2019-02-03: 900 mg via INTRAVENOUS
  Filled 2019-02-03: qty 50

## 2019-02-03 MED ORDER — OXYCODONE HCL 5 MG PO TABS: 5.0000 mg | ORAL_TABLET | ORAL | Status: DC | PRN

## 2019-02-03 MED ORDER — LATANOPROST 0.005 % OP SOLN
1.0000 [drp] | Freq: Every day | OPHTHALMIC | Status: DC
  Filled 2019-02-03: qty 2.5

## 2019-02-03 MED ORDER — BUPIVACAINE HCL (PF) 0.25 % IJ SOLN
INTRAMUSCULAR | Status: AC
  Filled 2019-02-03: qty 30

## 2019-02-03 MED ORDER — FLUOXETINE HCL 20 MG PO CAPS
40.0000 mg | ORAL_CAPSULE | Freq: Every day | ORAL | Status: DC
  Administered 2019-02-04: 40 mg via ORAL
  Filled 2019-02-03: qty 2

## 2019-02-03 MED ORDER — TRANEXAMIC ACID-NACL 1000-0.7 MG/100ML-% IV SOLN
1000.0000 mg | INTRAVENOUS | Status: AC
  Administered 2019-02-03: 1000 mg via INTRAVENOUS
  Filled 2019-02-03: qty 100

## 2019-02-03 MED ORDER — DOCUSATE SODIUM 100 MG PO CAPS
100.0000 mg | ORAL_CAPSULE | Freq: Two times a day (BID) | ORAL | Status: DC
  Administered 2019-02-03 – 2019-02-04 (×2): 100 mg via ORAL
  Filled 2019-02-03 (×2): qty 1

## 2019-02-03 MED ORDER — MIDAZOLAM HCL 2 MG/2ML IJ SOLN
1.0000 mg | INTRAMUSCULAR | Status: DC
  Administered 2019-02-03: 2 mg via INTRAVENOUS
  Filled 2019-02-03: qty 2

## 2019-02-03 MED ORDER — PHENOL 1.4 % MT LIQD: 1.0000 | OROMUCOSAL | Status: DC | PRN

## 2019-02-03 MED ORDER — METHOCARBAMOL 500 MG IVPB - SIMPLE MED
500.0000 mg | Freq: Four times a day (QID) | INTRAVENOUS | Status: DC | PRN
  Administered 2019-02-03: 500 mg via INTRAVENOUS
  Filled 2019-02-03: qty 50

## 2019-02-03 MED ORDER — ONDANSETRON HCL 4 MG/2ML IJ SOLN
INTRAMUSCULAR | Status: AC
  Filled 2019-02-03: qty 2

## 2019-02-03 MED ORDER — CHLORHEXIDINE GLUCONATE 4 % EX LIQD: 60.0000 mL | Freq: Once | CUTANEOUS | Status: DC

## 2019-02-03 MED ORDER — LACTATED RINGERS IV SOLN
INTRAVENOUS | Status: DC
  Administered 2019-02-03 (×2): via INTRAVENOUS

## 2019-02-03 MED ORDER — ONDANSETRON HCL 4 MG/2ML IJ SOLN: 4.0000 mg | Freq: Once | INTRAMUSCULAR | Status: DC | PRN

## 2019-02-03 MED ORDER — ROPIVACAINE HCL 7.5 MG/ML IJ SOLN
INTRAMUSCULAR | Status: DC | PRN
  Administered 2019-02-03: 20 mL via PERINEURAL

## 2019-02-03 MED ORDER — DEXAMETHASONE SODIUM PHOSPHATE 10 MG/ML IJ SOLN
INTRAMUSCULAR | Status: DC | PRN
  Administered 2019-02-03: 10 mg via INTRAVENOUS

## 2019-02-03 MED ORDER — 0.9 % SODIUM CHLORIDE (POUR BTL) OPTIME
TOPICAL | Status: DC | PRN
  Administered 2019-02-03: 1000 mL

## 2019-02-03 MED ORDER — ONDANSETRON HCL 4 MG/2ML IJ SOLN
4.0000 mg | Freq: Four times a day (QID) | INTRAMUSCULAR | Status: DC | PRN
  Filled 2019-02-03: qty 2

## 2019-02-03 MED ORDER — BUPIVACAINE HCL (PF) 0.25 % IJ SOLN
INTRAMUSCULAR | Status: DC | PRN
  Administered 2019-02-03: 30 mL

## 2019-02-03 MED ORDER — PHENYLEPHRINE HCL-NACL 10-0.9 MG/250ML-% IV SOLN
INTRAVENOUS | Status: DC | PRN
  Administered 2019-02-03: 30 ug/min via INTRAVENOUS

## 2019-02-03 MED ORDER — ONDANSETRON HCL 4 MG/2ML IJ SOLN
INTRAMUSCULAR | Status: DC | PRN
  Administered 2019-02-03: 4 mg via INTRAVENOUS

## 2019-02-03 MED ORDER — SODIUM CHLORIDE 0.9 % IR SOLN
Status: DC | PRN
  Administered 2019-02-03: 1000 mL

## 2019-02-03 MED ORDER — METOCLOPRAMIDE HCL 5 MG PO TABS: 5.0000 mg | ORAL_TABLET | Freq: Three times a day (TID) | ORAL | Status: DC | PRN

## 2019-02-03 MED ORDER — METHOCARBAMOL 500 MG IVPB - SIMPLE MED
INTRAVENOUS | Status: AC
  Filled 2019-02-03: qty 50

## 2019-02-03 MED ORDER — PROPOFOL 500 MG/50ML IV EMUL
INTRAVENOUS | Status: DC | PRN
  Administered 2019-02-03: 30 ug/kg/min via INTRAVENOUS

## 2019-02-03 MED ORDER — VITAMIN E 180 MG (400 UNIT) PO CAPS
400.0000 [IU] | ORAL_CAPSULE | Freq: Every day | ORAL | Status: DC
  Administered 2019-02-04: 400 [IU] via ORAL
  Filled 2019-02-03: qty 1

## 2019-02-03 MED ORDER — KETOROLAC TROMETHAMINE 15 MG/ML IJ SOLN
7.5000 mg | Freq: Four times a day (QID) | INTRAMUSCULAR | Status: AC
  Administered 2019-02-03 – 2019-02-04 (×4): 7.5 mg via INTRAVENOUS
  Filled 2019-02-03 (×4): qty 1

## 2019-02-03 MED ORDER — POLYETHYLENE GLYCOL 3350 17 G PO PACK: 17.0000 g | PACK | Freq: Every day | ORAL | Status: DC | PRN

## 2019-02-03 MED ORDER — POVIDONE-IODINE 10 % EX SWAB
2.0000 "application " | Freq: Once | CUTANEOUS | Status: AC
  Administered 2019-02-03: 2 via TOPICAL

## 2019-02-03 MED ORDER — PROPOFOL 10 MG/ML IV BOLUS
INTRAVENOUS | Status: DC | PRN
  Administered 2019-02-03: 20 mg via INTRAVENOUS

## 2019-02-03 MED ORDER — CLINDAMYCIN PHOSPHATE 600 MG/50ML IV SOLN
600.0000 mg | Freq: Four times a day (QID) | INTRAVENOUS | Status: AC
  Administered 2019-02-03 – 2019-02-04 (×2): 600 mg via INTRAVENOUS
  Filled 2019-02-03 (×2): qty 50

## 2019-02-03 MED ORDER — METOCLOPRAMIDE HCL 5 MG/ML IJ SOLN: 5.0000 mg | Freq: Three times a day (TID) | INTRAMUSCULAR | Status: DC | PRN

## 2019-02-03 MED ORDER — STERILE WATER FOR IRRIGATION IR SOLN
Status: DC | PRN
  Administered 2019-02-03: 1000 mL

## 2019-02-03 MED ORDER — ONDANSETRON HCL 4 MG PO TABS: 4.0000 mg | ORAL_TABLET | Freq: Four times a day (QID) | ORAL | Status: DC | PRN

## 2019-02-03 MED ORDER — ALUM & MAG HYDROXIDE-SIMETH 200-200-20 MG/5ML PO SUSP: 30.0000 mL | ORAL | Status: DC | PRN

## 2019-02-03 MED ORDER — ALBUTEROL SULFATE HFA 108 (90 BASE) MCG/ACT IN AERS: 1.0000 | INHALATION_SPRAY | Freq: Four times a day (QID) | RESPIRATORY_TRACT | Status: DC | PRN

## 2019-02-03 MED ORDER — VITAMIN D3 25 MCG PO TABS: 1000.0000 [IU] | ORAL_TABLET | Freq: Every day | ORAL | Status: DC

## 2019-02-03 MED ORDER — HYDROMORPHONE HCL 1 MG/ML IJ SOLN
INTRAMUSCULAR | Status: AC
  Filled 2019-02-03: qty 1

## 2019-02-03 MED ORDER — AMLODIPINE BESYLATE 2.5 MG PO TABS
2.5000 mg | ORAL_TABLET | Freq: Every day | ORAL | Status: DC
  Administered 2019-02-03: 2.5 mg via ORAL
  Filled 2019-02-03 (×2): qty 1

## 2019-02-03 MED ORDER — OXYCODONE HCL 5 MG PO TABS: 10.0000 mg | ORAL_TABLET | ORAL | Status: DC | PRN

## 2019-02-03 MED ORDER — MONTELUKAST SODIUM 10 MG PO TABS
10.0000 mg | ORAL_TABLET | Freq: Every day | ORAL | Status: DC
  Administered 2019-02-03: 10 mg via ORAL
  Filled 2019-02-03: qty 1

## 2019-02-03 MED ORDER — FENTANYL CITRATE (PF) 100 MCG/2ML IJ SOLN
50.0000 ug | INTRAMUSCULAR | Status: DC
  Administered 2019-02-03: 50 ug via INTRAVENOUS
  Filled 2019-02-03: qty 2

## 2019-02-03 SURGICAL SUPPLY — 57 items
APL SKNCLS STERI-STRIP NONHPOA (GAUZE/BANDAGES/DRESSINGS)
BAG SPEC THK2 15X12 ZIP CLS (MISCELLANEOUS) ×1
BAG ZIPLOCK 12X15 (MISCELLANEOUS) ×2 IMPLANT
BENZOIN TINCTURE PRP APPL 2/3 (GAUZE/BANDAGES/DRESSINGS) IMPLANT
BLADE SAG 18X100X1.27 (BLADE) IMPLANT
BLADE SURG SZ10 CARB STEEL (BLADE) ×6 IMPLANT
BNDG ELASTIC 6X5.8 VLCR STR LF (GAUZE/BANDAGES/DRESSINGS) ×5 IMPLANT
BOWL SMART MIX CTS (DISPOSABLE) IMPLANT
BSPLAT TIB 3 KN TRITANIUM (Knees) ×1 IMPLANT
CEMENT BONE SIMPLEX SPEEDSET (Cement) IMPLANT
CLOSURE WOUND 1/2 X4 (GAUZE/BANDAGES/DRESSINGS)
COVER SURGICAL LIGHT HANDLE (MISCELLANEOUS) ×3 IMPLANT
COVER WAND RF STERILE (DRAPES) IMPLANT
CUFF TOURN SGL QUICK 34 (TOURNIQUET CUFF) ×3
CUFF TRNQT CYL 34X4.125X (TOURNIQUET CUFF) ×1 IMPLANT
DECANTER SPIKE VIAL GLASS SM (MISCELLANEOUS) IMPLANT
DRAPE U-SHAPE 47X51 STRL (DRAPES) ×3 IMPLANT
DRSG PAD ABDOMINAL 8X10 ST (GAUZE/BANDAGES/DRESSINGS) ×6 IMPLANT
DURAPREP 26ML APPLICATOR (WOUND CARE) ×3 IMPLANT
ELECT REM PT RETURN 15FT ADLT (MISCELLANEOUS) ×3 IMPLANT
FEMORAL POSTERIOR SZ3 LT KNEE (Orthopedic Implant) IMPLANT
GAUZE SPONGE 4X4 12PLY STRL (GAUZE/BANDAGES/DRESSINGS) ×3 IMPLANT
GAUZE XEROFORM 1X8 LF (GAUZE/BANDAGES/DRESSINGS) ×2 IMPLANT
GLOVE BIO SURGEON STRL SZ7.5 (GLOVE) ×3 IMPLANT
GLOVE BIOGEL PI IND STRL 8 (GLOVE) ×2 IMPLANT
GLOVE BIOGEL PI INDICATOR 8 (GLOVE) ×4
GLOVE ECLIPSE 8.0 STRL XLNG CF (GLOVE) ×3 IMPLANT
GOWN STRL REUS W/TWL XL LVL3 (GOWN DISPOSABLE) ×6 IMPLANT
HANDPIECE INTERPULSE COAX TIP (DISPOSABLE) ×3
HOLDER FOLEY CATH W/STRAP (MISCELLANEOUS) ×2 IMPLANT
IMMOBILIZER KNEE 20 (SOFTGOODS) ×3 IMPLANT
IMMOBILIZER KNEE 20 THIGH 36 (SOFTGOODS) ×1 IMPLANT
INSERT PS TRIATH X3 #3 10 (Insert) ×2 IMPLANT
KIT TURNOVER KIT A (KITS) IMPLANT
KNEE PATELLA ASYMMETRIC 9X29 (Knees) ×2 IMPLANT
KNEE TIBIAL COMPONENT SZ3 (Knees) ×2 IMPLANT
NS IRRIG 1000ML POUR BTL (IV SOLUTION) ×3 IMPLANT
PACK TOTAL KNEE CUSTOM (KITS) ×3 IMPLANT
PADDING CAST COTTON 6X4 STRL (CAST SUPPLIES) ×6 IMPLANT
PENCIL SMOKE EVACUATOR (MISCELLANEOUS) ×2 IMPLANT
PIN FLUTED HEDLESS FIX 3.5X1/8 (PIN) ×2 IMPLANT
POSTERIOR FEMORAL SZ3 LT KNEE (Orthopedic Implant) ×3 IMPLANT
PROTECTOR NERVE ULNAR (MISCELLANEOUS) ×3 IMPLANT
SET HNDPC FAN SPRY TIP SCT (DISPOSABLE) ×1 IMPLANT
SET PAD KNEE POSITIONER (MISCELLANEOUS) ×3 IMPLANT
STAPLER VISISTAT 35W (STAPLE) IMPLANT
STRIP CLOSURE SKIN 1/2X4 (GAUZE/BANDAGES/DRESSINGS) IMPLANT
SUT MNCRL AB 4-0 PS2 18 (SUTURE) IMPLANT
SUT VIC AB 0 CT1 27 (SUTURE) ×3
SUT VIC AB 0 CT1 27XBRD ANTBC (SUTURE) ×1 IMPLANT
SUT VIC AB 1 CT1 36 (SUTURE) ×6 IMPLANT
SUT VIC AB 2-0 CT1 27 (SUTURE) ×6
SUT VIC AB 2-0 CT1 TAPERPNT 27 (SUTURE) ×2 IMPLANT
TRAY FOLEY MTR SLVR 16FR STAT (SET/KITS/TRAYS/PACK) ×3 IMPLANT
WATER STERILE IRR 1000ML POUR (IV SOLUTION) ×3 IMPLANT
WRAP KNEE MAXI GEL POST OP (GAUZE/BANDAGES/DRESSINGS) ×3 IMPLANT
YANKAUER SUCT BULB TIP 10FT TU (MISCELLANEOUS) ×3 IMPLANT

## 2019-02-03 NOTE — Progress Notes (Signed)
AssistedDr. Ossey with left, ultrasound guided, adductor canal block. Side rails up, monitors on throughout procedure. See vital signs in flow sheet. Tolerated Procedure well.  

## 2019-02-03 NOTE — H&P (Signed)
  The patient is fully aware that we are proceeding to surgery today for a left total knee arthroplasty to treat her severe knee arthritis.  All question concerns been answered and addressed.  The risk and benefits of surgery been discussed and informed consent is obtained.  There has been no change in her medical status acutely.  See H&P.

## 2019-02-03 NOTE — Brief Op Note (Signed)
02/03/2019  3:40 PM  PATIENT:  Michelle Johnston  68 y.o. female  PRE-OPERATIVE DIAGNOSIS:  osteoarthritis left knee  POST-OPERATIVE DIAGNOSIS:  osteoarthritis left knee  PROCEDURE:  Procedure(s): LEFT TOTAL KNEE ARTHROPLASTY (Left)  SURGEON:  Surgeon(s) and Role:    Mcarthur Rossetti, MD - Primary  PHYSICIAN ASSISTANT:  Benita Stabile, PA-C  ANESTHESIA:   local, regional and spinal  EBL:  50 mL   COUNTS:  YES  TOURNIQUET:  * Missing tourniquet times found for documented tourniquets in log: [id number] *  DICTATION: .Other Dictation: Dictation Number [id number]  PLAN OF CARE: Admit to inpatient   PATIENT DISPOSITION:  PACU - hemodynamically stable.   Delay start of Pharmacological VTE agent (>24hrs) due to surgical blood loss or risk of bleeding: no

## 2019-02-03 NOTE — Transfer of Care (Signed)
Immediate Anesthesia Transfer of Care Note  Patient: Michelle Johnston  Procedure(s) Performed: LEFT TOTAL KNEE ARTHROPLASTY (Left Knee)  Patient Location: PACU  Anesthesia Type:Spinal  Level of Consciousness: awake, alert  and oriented  Airway & Oxygen Therapy: Patient Spontanous Breathing and Patient connected to face mask oxygen  Post-op Assessment: Report given to RN and Post -op Vital signs reviewed and stable  Post vital signs: Reviewed and stable  Last Vitals:  Vitals Value Taken Time  BP 152/73 02/03/19 1604  Temp    Pulse 63 02/03/19 1606  Resp 19 02/03/19 1606  SpO2 100 % 02/03/19 1606  Vitals shown include unvalidated device data.  Last Pain:  Vitals:   02/03/19 1214  TempSrc: Oral         Complications: No apparent anesthesia complications

## 2019-02-03 NOTE — Anesthesia Procedure Notes (Signed)
Spinal  Patient location during procedure: OR Start time: 02/03/2019 2:15 PM End time: 02/03/2019 2:27 PM Staffing Anesthesiologist: Catalina Gravel, MD Resident/CRNA: Silas [place], CRNA Performed: resident/CRNA  Preanesthetic Checklist Completed: patient identified, site marked, surgical consent, pre-op evaluation, timeout performed, IV checked, risks and benefits discussed and monitors and equipment checked Spinal Block Patient position: sitting Prep: DuraPrep Patient monitoring: heart rate, cardiac monitor, continuous pulse ox and blood pressure Approach: midline Location: L3-4 Injection technique: single-shot Needle Needle type: Sprotte  Needle gauge: 24 G Needle length: 9 cm Assessment Sensory level: T4 Additional Notes IV functioning, monitors applied to pt. Expiration date of kit checked and confirmed to be in date. Sterile prep and drape, hand hygiene and sterile gloved used. Pt was positioned and spine was prepped in sterile fashion. Skin was anesthetized with lidocaine. Free flow of clear CSF obtained prior to injecting local anesthetic into CSF x 1 attempt. Spinal needle aspirated freely following injection. Needle was carefully withdrawn, and pt tolerated procedure well. Loss of motor and sensory on exam post injection.

## 2019-02-03 NOTE — Anesthesia Preprocedure Evaluation (Signed)
Anesthesia Evaluation  Patient identified by MRN, date of birth, ID band Patient awake    Reviewed: Allergy & Precautions, NPO status , Patient's Chart, lab work & pertinent test results  Airway Mallampati: I  TM Distance: >3 FB Neck ROM: Full    Dental   Pulmonary former smoker,    Pulmonary exam normal        Cardiovascular hypertension, Pt. on medications Normal cardiovascular exam     Neuro/Psych Depression    GI/Hepatic   Endo/Other    Renal/GU      Musculoskeletal   Abdominal   Peds  Hematology   Anesthesia Other Findings   Reproductive/Obstetrics                             Anesthesia Physical Anesthesia Plan  ASA: II  Anesthesia Plan: Spinal   Post-op Pain Management:  Regional for Post-op pain   Induction: Intravenous  PONV Risk Score and Plan: 2 and Ondansetron and Treatment may vary due to age or medical condition  Airway Management Planned: Nasal Cannula  Additional Equipment:   Intra-op Plan:   Post-operative Plan:   Informed Consent: I have reviewed the patients History and Physical, chart, labs and discussed the procedure including the risks, benefits and alternatives for the proposed anesthesia with the patient or authorized representative who has indicated his/her understanding and acceptance.       Plan Discussed with: CRNA and Surgeon  Anesthesia Plan Comments:         Anesthesia Quick Evaluation

## 2019-02-03 NOTE — Anesthesia Procedure Notes (Signed)
Anesthesia Regional Block: Adductor canal block   Pre-Anesthetic Checklist: ,, timeout performed, Correct Patient, Correct Site, Correct Laterality, Correct Procedure, Correct Position, site marked, Risks and benefits discussed,  Surgical consent,  Pre-op evaluation,  At surgeon's request and post-op pain management  Laterality: Left  Prep: chloraprep       Needles:  Injection technique: Single-shot  Needle Type: Echogenic Stimulator Needle     Needle Length: 9cm  Needle Gauge: 21     Additional Needles:   Narrative:  Start time: 02/03/2019 1:44 PM End time: 02/03/2019 1:54 PM Injection made incrementally with aspirations every 5 mL.  Performed by: Personally  Anesthesiologist: Lillia Abed, MD  Additional Notes: Monitors applied. Patient sedated. Sterile prep and drape,hand hygiene and sterile gloves were used. Relevant anatomy identified.Needle position confirmed.Local anesthetic injected incrementally after negative aspiration. Local anesthetic spread visualized around nerve(s). Vascular puncture avoided. No complications. Image printed for medical record.The patient tolerated the procedure well.    Lillia Abed MD

## 2019-02-03 NOTE — Op Note (Signed)
NAME: Michelle Johnston, KIETZMAN MEDICAL RECORD [id number] ACCOUNT [id number] DATE OF BIRTH:January 07, 1951 FACILITY: [facility] LOCATION: [facility] PHYSICIAN:Jamal Pavon [place], MD  OPERATIVE REPORT  DATE OF PROCEDURE:  02/03/2019  PREOPERATIVE DIAGNOSIS:  Primary osteoarthritis and degenerative joint disease, left knee.  POSTOPERATIVE DIAGNOSIS:  Primary osteoarthritis and degenerative joint disease, left knee.  PROCEDURE:  Left total knee arthroplasty.  IMPLANTS:  Stryker Triathlon press-fit knee system with size 3 femur, size 3 tibial tray, 10 mm fixed-bearing polyethylene insert, size 29 press-fit patellar button.  SURGEON:  Lind Guest. Ninfa Linden, MD  ASSISTANT:  Erskine Emery, PA-C  ANESTHESIA: 1.  Left lower extremity adductor canal block 2.  Spinal.  ANTIBIOTICS:  900 mg IV clindamycin.  TOURNIQUET TIME:  Less than 1 hour.  ESTIMATED BLOOD LOSS:  Less than 100 mL.  COMPLICATIONS:  None.  INDICATIONS:  The patient is a 68 year old female with debilitating arthritis involving her left knee.  She has tried and failed multiple attempts at conservative treatment for her knee.  She has a valgus malalignment of the knee with complete loss of  joint space laterally.  There is patellofemoral arthritic changes.  She has recurrent effusions as well.  At this point, her pain has become daily and is detrimentally affecting her mobility her quality of life and activities of daily living to the point  she does wish to proceed with total knee arthroplasty and we have recommended this as well.  We have explained to her the risk of acute blood loss anemia, nerve or vessel injury, fracture, infection, implant failure and DVT.  She understands our goals  are to decrease pain, improve mobility and overall improve quality of life.  DESCRIPTION OF PROCEDURE:  After informed consent was obtained and appropriate left knee was marked, an adductor canal block was obtained in the holding room.  She was then  brought to the operating room and sat up on the operating table.  Spinal  anesthesia was obtained.  She was then laid in supine position on the operating table.  Foley catheter was placed and nonsterile tourniquet was placed around the upper left thigh.  Her left thigh, knee, leg, ankle and foot were prepped and draped with  DuraPrep and sterile drapes including a sterile stockinette.  Time-out was called and she was identified, correct patient, correct left knee.  We made an incision over the patella and carried this proximally and distally.  This was after wrapping out the  knee with an Esmarch and inflating the tourniquet to 300 mm of pressure.  We performed a medial parapatellar arthrotomy finding very large joint effusion.  We then removed periarticular osteophytes around the knee.  With the knee in a flexed position,  you could see that she had complete loss of the cartilage of the lateral compartment of her knee.  We removed remnants of ACL, PCL, medial and lateral meniscus.  We set her extramedullary cutting guide for taking 2 mm off the high side, correcting varus  and valgus and neutral slope.  We made this cut without difficulty and decided given the weight the cut locally would come down 2 more millimeters, which we did.  We then used an intramedullary guide for the femur, making our distal femoral cut for a  left knee at 5 degrees externally rotated and a 10 mm distal femoral cut.  We made this cut without difficulty and brought the knee back down to full extension and achieved full extension with a 9 mm extension block.  We then went  back to the femur and  put our femoral sizing guide based off the epicondylar axis choosing a size 3 femur.  We put a 4-in-1 cutting block for size 3 femur, made our anterior and posterior cuts, followed by our chamfer cuts.  I then made our femoral box cut.  Attention was  then turned back to the tibia.  I chose a size 3 tibial tray for coverage setting the  rotation off the tibial tubercle and the femur.  We did our keel punch off of this.  Of note, we did recognize the good quality of her bone and so we felt that  press-fit components throughout would be obtainable.  We then trialed our trial size 3 tibia followed by our size 3 femur.  We went with a 9 mm trial polyethylene insert.  We then made our patellar cut and drilled 3 holes for a press-fit size 29 patellar  button.  With all trial instrumentation in the knee, we put the knee through range of motion.  It was stable, but I felt like we needed just 1 more mm thickening polyethylene insert.  We then removed all trial components from the knee.  We inserted  Marcaine around the knee joint capsule and irrigated the knee with normal saline solution using pulsatile lavage.  We dried the knee real well and then placed our real press-fit Stryker tibial tray size 3 followed by real size 3 left press-fit femur.  We  placed our 10 mm fixed bearing polyethylene insert and press-fit our patellar button.  We then let the tourniquet down.  Hemostasis obtained with electrocautery.  We put the knee through range of motion and I was pleased with stability and motion.  We  then closed the arthrotomy with interrupted #1 Vicryl suture followed by 0 Vicryl to close the deep tissue, 2-0 Vicryl subcutaneous tissue and interrupted staples reapproximated the skin.  Xeroform and well-padded sterile dressing was applied.  She was  taken to recovery room in stable condition.  All final counts were correct.  There were no complications noted.  Of note, Benita Stabile, PA-C, assisted in the entire case.  His assistance was crucial for facilitating all aspects of this case.  TN/NUANCE  D:02/03/2019 T:02/03/2019 JOB:[id number]

## 2019-02-04 DIAGNOSIS — Z881 Allergy status to other antibiotic agents status: Secondary | ICD-10-CM | POA: Diagnosis not present

## 2019-02-04 DIAGNOSIS — M1712 Unilateral primary osteoarthritis, left knee: Secondary | ICD-10-CM | POA: Diagnosis not present

## 2019-02-04 DIAGNOSIS — Z791 Long term (current) use of non-steroidal anti-inflammatories (NSAID): Secondary | ICD-10-CM | POA: Diagnosis not present

## 2019-02-04 DIAGNOSIS — F329 Major depressive disorder, single episode, unspecified: Secondary | ICD-10-CM | POA: Diagnosis not present

## 2019-02-04 DIAGNOSIS — Z7951 Long term (current) use of inhaled steroids: Secondary | ICD-10-CM | POA: Diagnosis not present

## 2019-02-04 DIAGNOSIS — H409 Unspecified glaucoma: Secondary | ICD-10-CM | POA: Diagnosis not present

## 2019-02-04 DIAGNOSIS — M25462 Effusion, left knee: Secondary | ICD-10-CM | POA: Diagnosis not present

## 2019-02-04 DIAGNOSIS — Z79899 Other long term (current) drug therapy: Secondary | ICD-10-CM | POA: Diagnosis not present

## 2019-02-04 DIAGNOSIS — Z87891 Personal history of nicotine dependence: Secondary | ICD-10-CM | POA: Diagnosis not present

## 2019-02-04 LAB — BASIC METABOLIC PANEL
Anion gap: 10 (ref 5–15)
BUN: 13 mg/dL (ref 8–23)
CO2: 21 mmol/L — ABNORMAL LOW (ref 22–32)
Calcium: 8.3 mg/dL — ABNORMAL LOW (ref 8.9–10.3)
Chloride: 100 mmol/L (ref 98–111)
Creatinine, Ser: 0.42 mg/dL — ABNORMAL LOW (ref 0.44–1.00)
GFR calc Af Amer: >60 mL/min (ref >60)
GFR calc non Af Amer: >60 mL/min (ref >60)
Glucose, Bld: 164 mg/dL — ABNORMAL HIGH (ref 70–99)
Potassium: 3.5 mmol/L (ref 3.5–5.1)
Sodium: 131 mmol/L — ABNORMAL LOW (ref 135–145)

## 2019-02-04 LAB — CBC
HCT: 33.1 % — ABNORMAL LOW (ref 36.0–46.0)
Hemoglobin: 10.9 g/dL — ABNORMAL LOW (ref 12.0–15.0)
MCH: 33.2 pg (ref 26.0–34.0)
MCHC: 32.9 g/dL (ref 30.0–36.0)
MCV: 100.9 fL — ABNORMAL HIGH (ref 80.0–100.0)
Platelets: 222 10*3/uL (ref 150–400)
RBC: 3.28 MIL/uL — ABNORMAL LOW (ref 3.87–5.11)
RDW: 12.1 % (ref 11.5–15.5)
WBC: 7.7 10*3/uL (ref 4.0–10.5)
nRBC: 0 % (ref 0.0–0.2)

## 2019-02-04 MED ORDER — METHOCARBAMOL 500 MG PO TABS: 500.0000 mg | ORAL_TABLET | Freq: Four times a day (QID) | ORAL | 1 refills | Status: DC | PRN

## 2019-02-04 MED ORDER — ASPIRIN 81 MG PO CHEW: 81.0000 mg | CHEWABLE_TABLET | Freq: Two times a day (BID) | ORAL | 0 refills | Status: DC

## 2019-02-04 MED ORDER — HYDROMORPHONE HCL 2 MG PO TABS: 2.0000 mg | ORAL_TABLET | ORAL | 0 refills | Status: DC | PRN

## 2019-02-04 MED ORDER — HYDROMORPHONE HCL 2 MG PO TABS
2.0000 mg | ORAL_TABLET | Freq: Once | ORAL | Status: AC
  Administered 2019-02-04: 2 mg via ORAL
  Filled 2019-02-04: qty 1

## 2019-02-04 NOTE — Discharge Instructions (Signed)

## 2019-02-04 NOTE — Evaluation (Signed)
Physical Therapy Evaluation Patient Details Name: Michelle Johnston MRN: [id number] DOB: Feb 05, 1951 Today's Date: 02/04/2019   History of Present Illness  68 yo female s/p L TKA 02/03/19  Clinical Impression  On eval, pt was Min assist for mobility. She walked ~100 feet with a RW. Moderate pain with activity. Plan is for d/c home later today if all continues to go well. HHPT to f/u.     Follow Up Recommendations Follow surgeon's recommendation for DC plan and follow-up therapies    Equipment Recommendations  None recommended by PT    Recommendations for Other Services       Precautions / Restrictions Precautions Precautions: Fall Restrictions Weight Bearing Restrictions: No Other Position/Activity Restrictions: WBAT      Mobility  Bed Mobility Overal bed mobility: Needs Assistance Bed Mobility: Supine to Sit     Supine to sit: Min assist     General bed mobility comments: Assist to scoot to EOB (husband assisted).  Transfers Overall transfer level: Needs assistance Equipment used: Rolling walker (2 wheeled) Transfers: Sit to/from Stand Sit to Stand: Min guard         General transfer comment: Close guard for safety. VCs safety, technique, hand placement.  Ambulation/Gait Ambulation/Gait assistance: Min guard Gait Distance (Feet): 100 Feet Assistive device: Rolling walker (2 wheeled) Gait Pattern/deviations: Step-to pattern;Antalgic     General Gait Details: Close guard for safety. VCs safety, sequence.  Stairs            Wheelchair Mobility    Modified Rankin (Stroke Patients Only)       Balance Overall balance assessment: Needs assistance         Standing balance support: Bilateral upper extremity supported Standing balance-Leahy Scale: Poor                               Pertinent Vitals/Pain Pain Assessment: 0-10 Pain Score: 7  Pain Location: L knee Pain Descriptors / Indicators: Sore;Aching Pain Intervention(s): Monitored  during session;Ice applied;Repositioned    Home Living Family/patient expects to be discharged to:: Private residence Living Arrangements: Spouse/significant other Available Help at Discharge: Family Type of Home: House Home Access: Stairs to enter Entrance Stairs-Rails: Psychiatric nurse of Steps: 3 Home Layout: Able to live on main level with bedroom/bathroom(planning to sleep in recliner) Home Equipment: [place] - 2 wheels;Bedside commode;Cane - single point      Prior Function Level of Independence: Independent               Hand Dominance        Extremity/Trunk Assessment   Upper Extremity Assessment Upper Extremity Assessment: Overall WFL for tasks assessed    Lower Extremity Assessment Lower Extremity Assessment: Generalized weakness    Cervical / Trunk Assessment Cervical / Trunk Assessment: Normal  Communication   Communication: No difficulties  Cognition Arousal/Alertness: Awake/alert Behavior During Therapy: WFL for tasks assessed/performed Overall Cognitive Status: Within Functional Limits for tasks assessed                                        General Comments      Exercises Total Joint Exercises Ankle Circles/Pumps: AROM;Both;10 reps;Supine Quad Sets: AROM;Both;10 reps;Supine Heel Slides: AAROM;Left;10 reps;Supine Hip ABduction/ADduction: AROM;Left;10 reps;Supine Straight Leg Raises: AROM;Left;10 reps;Supine Long Arc Quad: AROM;Left;10 reps;Seated Knee Flexion: AROM;Left;10 reps;Seated Goniometric ROM: ~10-70 degrees  Assessment/Plan    PT Assessment Patient needs continued PT services  PT Problem List Decreased strength;Decreased range of motion;Decreased mobility;Decreased activity tolerance;Decreased balance;Decreased knowledge of use of DME;Pain;Decreased knowledge of precautions       PT Treatment Interventions DME instruction;Gait training;Therapeutic exercise;Therapeutic  activities;Patient/family education;Stair training;Balance training;Functional mobility training    PT Goals (Current goals can be found in the Care Plan section)  Acute Rehab PT Goals Patient Stated Goal: home. less pain. regain PLOF/independence PT Goal Formulation: With patient/family Time For Goal Achievement: 02/18/19 Potential to Achieve Goals: Good    Frequency 7X/week   Barriers to discharge        Co-evaluation               AM-PAC PT "6 Clicks" Mobility  Outcome Measure Help needed turning from your back to your side while in a flat bed without using bedrails?: A Little Help needed moving from lying on your back to sitting on the side of a flat bed without using bedrails?: A Little Help needed moving to and from a bed to a chair (including a wheelchair)?: A Little Help needed standing up from a chair using your arms (e.g., wheelchair or bedside chair)?: A Little Help needed to walk in hospital room?: A Little Help needed climbing 3-5 steps with a railing? : A Little 6 Click Score: 18    End of Session Equipment Utilized During Treatment: Gait belt Activity Tolerance: Patient tolerated treatment well Patient left: in chair;with call bell/phone within reach;with family/visitor present   PT Visit Diagnosis: Pain;Other abnormalities of gait and mobility (R26.89) Pain - Right/Left: Left Pain - part of body: Knee    Time: [id number] PT Time Calculation (min) (ACUTE ONLY): 30 min   Charges:   PT Evaluation $PT Eval Low Complexity: 1 Low PT Treatments $Gait Training: 8-22 mins         Weston Anna, PT Acute Rehabilitation Services Pager: [phone] Office: [phone]

## 2019-02-04 NOTE — Plan of Care (Signed)
Plan of care reviewed and discussed with the patient. 

## 2019-02-04 NOTE — TOC Progression Note (Signed)
Transition of Care [facility]) - Progression Note    Patient Details  Name: Michelle Johnston MRN: [id number] Date of Birth: [birth year]  [facility]) CM/SW Contact  Joaquin Courts, RN Phone Number: 02/04/2019, 11:30 AM  Clinical Narrative:    CM spoke with patient at bedside. Patient set up with [facility] for [place]. Reports has rolling walker and 3-in-1 at home.    Expected Discharge Plan: [place] Barriers to Discharge: No Barriers Identified  Expected Discharge Plan and Services Expected Discharge Plan: [place]   Discharge Planning Services: CM Consult Post Acute Care Choice: [place] arrangements for the past 2 months: Single Family Home Expected Discharge Date: 02/04/19               DME Arranged: N/A DME Agency: NA       HH Arranged: PT HH Agency: [facility] (formerly [employer]) Date [place]: 02/04/19 Time [place]: 29 Representative spoke with at [place]: Pre arranged in MD office   Social Determinants of Health (SDOH) Interventions    Readmission Risk Interventions No flowsheet data found.

## 2019-02-04 NOTE — Progress Notes (Signed)
Physical Therapy Treatment Patient Details Name: Michelle Johnston MRN: [id number] DOB: March 24, 1950 Today's Date: 02/04/2019    History of Present Illness 68 yo female s/p L TKA 02/03/19    PT Comments    Progressing well with mobility. Reviewed gait training and stair training. Issued HEP for pt to perform 2x/day until she begins OP PT. All education completed. Okay to d/c from PT standpoint.    Follow Up Recommendations  Follow surgeon's recommendation for DC plan and follow-up therapies     Equipment Recommendations  None recommended by PT    Recommendations for Other Services       Precautions / Restrictions Precautions Precautions: Fall Restrictions Weight Bearing Restrictions: No Other Position/Activity Restrictions: WBAT    Mobility  Bed Mobility Overal bed mobility: Needs Assistance Bed Mobility: Supine to Sit     Supine to sit: Supervision     General bed mobility comments: Assist to scoot to EOB (husband assisted).  Transfers Overall transfer level: Needs assistance Equipment used: Rolling walker (2 wheeled) Transfers: Sit to/from Stand Sit to Stand: Min guard         General transfer comment: Increased time. VCs safety, technique, hand placement.  Ambulation/Gait Ambulation/Gait assistance: Min guard Gait Distance (Feet): 100 Feet Assistive device: Rolling walker (2 wheeled) Gait Pattern/deviations: Step-to pattern;Step-through pattern;Decreased stride length     General Gait Details: Close guard for safety.   Stairs Stairs: Yes Stairs assistance: Min guard Stair Management: Step to pattern;Sideways;One rail Left Number of Stairs: 3 General stair comments: VCs safety, technique, sequence. Close guard for safety.   Wheelchair Mobility    Modified Rankin (Stroke Patients Only)       Balance Overall balance assessment: Needs assistance         Standing balance support: Bilateral upper extremity supported Standing balance-Leahy Scale:  Poor                              Cognition Arousal/Alertness: Awake/alert Behavior During Therapy: WFL for tasks assessed/performed Overall Cognitive Status: Within Functional Limits for tasks assessed                                        Exercises Total Joint Exercises Ankle Circles/Pumps: AROM;Both;10 reps;Supine Quad Sets: AROM;Both;10 reps;Supine Heel Slides: AAROM;Left;10 reps;Supine Hip ABduction/ADduction: AROM;Left;10 reps;Supine Straight Leg Raises: AROM;Left;10 reps;Supine Long Arc Quad: AROM;Left;10 reps;Seated Knee Flexion: AROM;Left;10 reps;Seated Goniometric ROM: ~10-70 degrees    General Comments        Pertinent Vitals/Pain Pain Assessment: 0-10 Pain Score: 6  Pain Location: L knee Pain Descriptors / Indicators: Sore;Aching Pain Intervention(s): Monitored during session;Repositioned    Home Living                      Prior Function            PT Goals (current goals can now be found in the care plan section) Acute Rehab PT Goals Patient Stated Goal: home. less pain. regain PLOF/independence PT Goal Formulation: With patient/family Time For Goal Achievement: 02/18/19 Potential to Achieve Goals: Good Progress towards PT goals: Progressing toward goals    Frequency    7X/week      PT Plan Current plan remains appropriate    Co-evaluation              AM-PAC PT "6 Clicks"  Mobility   Outcome Measure  Help needed turning from your back to your side while in a flat bed without using bedrails?: A Little Help needed moving from lying on your back to sitting on the side of a flat bed without using bedrails?: A Little Help needed moving to and from a bed to a chair (including a wheelchair)?: A Little Help needed standing up from a chair using your arms (e.g., wheelchair or bedside chair)?: A Little Help needed to walk in hospital room?: A Little Help needed climbing 3-5 steps with a railing? : A  Little 6 Click Score: 18    End of Session Equipment Utilized During Treatment: Gait belt Activity Tolerance: Patient tolerated treatment well Patient left: in bed;with call bell/phone within reach;with family/visitor present   PT Visit Diagnosis: Pain;Other abnormalities of gait and mobility (R26.89) Pain - Right/Left: Left Pain - part of body: Knee     Time: 1400-1419 PT Time Calculation (min) (ACUTE ONLY): 19 min  Charges:  $Gait Training: 8-22 mins                        Weston Anna, [place] Pager: [phone] Office: [phone] \

## 2019-02-04 NOTE — Progress Notes (Signed)
   Subjective: 1 Day Post-Op Procedure(s) (LRB): LEFT TOTAL KNEE ARTHROPLASTY (Left) Patient reports pain as mild and moderate.    Objective: Vital signs in last 24 hours: Temp:  [97.5 F (36.4 C)-98.7 F (37.1 C)] 97.7 F (36.5 C) (12/05 0856) Pulse Rate:  [56-103] 60 (12/05 0856) Resp:  [11-23] 16 (12/05 0856) BP: (128-169)/(64-92) 169/73 (12/05 0856) SpO2:  [95 %-100 %] 97 % (12/05 0856) Weight:  [66.7 kg] 66.7 kg (12/04 1729)  Intake/Output from previous day: 12/04 0701 - 12/05 0700 In: 4807.4 [P.O.:1040; I.V.:3567.4; IV Piggyback:200] Out: 1750 [Urine:1700; Blood:50] Intake/Output this shift: Total I/O In: 240 [P.O.:240] Out: 900 [Urine:900]  Recent Labs    02/02/19 1131 02/04/19 0229  HGB 13.0 10.9*   Recent Labs    02/02/19 1131 02/04/19 0229  WBC 4.0 7.7  RBC 3.85* 3.28*  HCT 38.6 33.1*  PLT 260 222   Recent Labs    02/02/19 1131 02/04/19 0229  NA 135 131*  K 4.4 3.5  CL 100 100  CO2 25 21*  BUN 20 13  CREATININE 0.51 0.42*  GLUCOSE 101* 164*  CALCIUM 9.5 8.3*   No results for input(s): LABPT, INR in the last 72 hours.  Neurologically intact Dg Knee Left Port  Result Date: 02/03/2019 CLINICAL DATA:  68 year old female with fall and left knee pain. EXAM: PORTABLE LEFT KNEE - 1-2 VIEW COMPARISON:  Left knee radiograph dated 07/20/2018. FINDINGS: There is a total left knee arthroplasty. The arthroplasty components appear intact and in anatomic alignment. No evidence of loosening. There is no acute fracture or dislocation. The bones are osteopenic. Postsurgical changes including joint effusion and air as well as cutaneous staples over the anterior knee. IMPRESSION: 1. No acute fracture or dislocation. 2. Total left knee arthroplasty appears intact and in anatomic alignment. Electronically Signed   By: Anner Crete M.D.   On: 02/03/2019 17:14    Assessment/Plan: 1 Day Post-Op Procedure(s) (LRB): LEFT TOTAL KNEE ARTHROPLASTY (Left) Up with  therapy, discharge home.   Marybelle Killings 02/04/2019, 11:15 AM

## 2019-02-04 NOTE — Care Management Obs Status (Signed)
MEDICARE OBSERVATION STATUS NOTIFICATION   Patient Details  Name: Michelle Johnston MRN: [id number] Date of Birth: 11/29/1950   Medicare Observation Status Notification Given:  Yes    Joaquin Courts, RN 02/04/2019, 9:56 AM

## 2019-02-05 DIAGNOSIS — Z7982 Long term (current) use of aspirin: Secondary | ICD-10-CM | POA: Diagnosis not present

## 2019-02-05 DIAGNOSIS — G8929 Other chronic pain: Secondary | ICD-10-CM | POA: Diagnosis not present

## 2019-02-05 DIAGNOSIS — Z87891 Personal history of nicotine dependence: Secondary | ICD-10-CM | POA: Diagnosis not present

## 2019-02-05 DIAGNOSIS — F329 Major depressive disorder, single episode, unspecified: Secondary | ICD-10-CM | POA: Diagnosis not present

## 2019-02-05 DIAGNOSIS — H409 Unspecified glaucoma: Secondary | ICD-10-CM | POA: Diagnosis not present

## 2019-02-05 DIAGNOSIS — Z471 Aftercare following joint replacement surgery: Secondary | ICD-10-CM | POA: Diagnosis not present

## 2019-02-05 DIAGNOSIS — Z8781 Personal history of (healed) traumatic fracture: Secondary | ICD-10-CM | POA: Diagnosis not present

## 2019-02-05 DIAGNOSIS — I10 Essential (primary) hypertension: Secondary | ICD-10-CM | POA: Diagnosis not present

## 2019-02-05 DIAGNOSIS — Z96652 Presence of left artificial knee joint: Secondary | ICD-10-CM | POA: Diagnosis not present

## 2019-02-05 NOTE — Anesthesia Postprocedure Evaluation (Signed)
Anesthesia Post Note  Patient: Michelle Johnston  Procedure(s) Performed: LEFT TOTAL KNEE ARTHROPLASTY (Left Knee)     Patient location during evaluation: PACU Anesthesia Type: Spinal Level of consciousness: oriented and awake and alert Pain management: pain level controlled Vital Signs Assessment: post-procedure vital signs reviewed and stable Respiratory status: spontaneous breathing, respiratory function stable and patient connected to nasal cannula oxygen Cardiovascular status: blood pressure returned to baseline and stable Postop Assessment: no headache, no backache, no apparent nausea or vomiting, spinal receding and patient able to bend at knees Anesthetic complications: no    Last Vitals:  Vitals:   02/04/19 0547 02/04/19 0856  BP: (!) 156/79 (!) 169/73  Pulse: (!) 57 60  Resp: 16 16  Temp: 36.4 C 36.5 C  SpO2: 100% 97%    Last Pain:  Vitals:   02/04/19 1216  TempSrc:   PainSc: [place]

## 2019-02-06 ENCOUNTER — Encounter (HOSPITAL_COMMUNITY): Payer: Self-pay | Admitting: Orthopaedic Surgery

## 2019-02-06 NOTE — Discharge Summary (Signed)
Patient ID: Michelle Johnston MRN: [id number] DOB/AGE: [birth year] 68 y.o.  Admit date: 02/03/2019 Discharge date: 02/06/2019  Admission Diagnoses:  Principal Problem:   Unilateral primary osteoarthritis, left knee Active Problems:   Status post total left knee replacement   Discharge Diagnoses:  Same  Past Medical History:  Diagnosis Date  . Arthritis   . Bradycardia   . Bronchitis   . Depression   . Glaucoma   . Hypertension   . Seasonal allergies   . Wears glasses     Surgeries: Procedure(s): LEFT TOTAL KNEE ARTHROPLASTY on 02/03/2019   Consultants:   Discharged Condition: Improved  Hospital Course: Michelle Johnston is an 68 y.o. female who was admitted 02/03/2019 for operative treatment ofUnilateral primary osteoarthritis, left knee. Patient has severe unremitting pain that affects sleep, daily activities, and work/hobbies. After pre-op clearance the patient was taken to the operating room on 02/03/2019 and underwent  Procedure(s): LEFT TOTAL KNEE ARTHROPLASTY.    Patient was given perioperative antibiotics:  Anti-infectives (From admission, onward)   Start     Dose/Rate Route Frequency Ordered Stop   02/03/19 2000  clindamycin (CLEOCIN) IVPB 600 mg     600 mg 100 mL/hr over 30 Minutes Intravenous Every 6 hours 02/03/19 1704 02/04/19 0302   02/03/19 1200  clindamycin (CLEOCIN) IVPB 900 mg     900 mg 100 mL/hr over 30 Minutes Intravenous On call to O.R. 02/03/19 1147 02/03/19 1425       Patient was given sequential compression devices, early ambulation, and chemoprophylaxis to prevent DVT.  Patient benefited maximally from hospital stay and there were no complications.    Recent vital signs: No data found.   Recent laboratory studies:  Recent Labs    02/04/19 0229  WBC 7.7  HGB 10.9*  HCT 33.1*  PLT 222  NA 131*  K 3.5  CL 100  CO2 21*  BUN 13  CREATININE 0.42*  GLUCOSE 164*  CALCIUM 8.3*     Discharge Medications:   Allergies as of 02/04/2019    Reactions   Ceclor [cefaclor] Hives   Ciprofloxacin Hives   Flagyl [metronidazole] Hives      Medication List    TAKE these medications   acetaminophen 500 MG tablet Commonly known as: TYLENOL Take 1,000 mg by mouth every 6 (six) hours as needed. pain   albuterol 108 (90 Base) MCG/ACT inhaler Commonly known as: VENTOLIN HFA Inhale 1-2 puffs into the lungs every 6 (six) hours as needed for wheezing or shortness of breath.   amLODipine 5 MG tablet Commonly known as: NORVASC Take 2.5 mg by mouth at bedtime.   aspirin 81 MG chewable tablet Chew 1 tablet (81 mg total) by mouth 2 (two) times daily.   b complex vitamins tablet Take 1 tablet by mouth daily.   calcium carbonate 1250 (500 Ca) MG tablet Commonly known as: OS-CAL - dosed in mg of elemental calcium Take 1 tablet by mouth daily with breakfast.   cetirizine 10 MG tablet Commonly known as: ZYRTEC Take 10 mg by mouth at bedtime.   cholecalciferol 1000 units tablet Commonly known as: VITAMIN D Take 1,000 Units by mouth daily.   Fish Oil 1000 MG Caps Take 1,000 mg by mouth daily.   FLUoxetine 20 MG capsule Commonly known as: PROZAC Take 40 mg by mouth daily.   Fluticasone-Salmeterol 250-50 MCG/DOSE Aepb Commonly known as: ADVAIR Inhale 1 puff into the lungs 2 (two) times daily as needed (asthma).   guaiFENesin 600 MG 12  hr tablet Commonly known as: MUCINEX Take 600 mg by mouth daily as needed.   HYDROmorphone 2 MG tablet Commonly known as: DILAUDID Take 1 tablet (2 mg total) by mouth every 4 (four) hours as needed for severe pain.   latanoprost 0.005 % ophthalmic solution Commonly known as: XALATAN Place 1 drop into both eyes at bedtime.   meloxicam 15 MG tablet Commonly known as: MOBIC Take 15 mg by mouth daily.   methocarbamol 500 MG tablet Commonly known as: ROBAXIN Take 1 tablet (500 mg total) by mouth every 6 (six) hours as needed for muscle spasms.   montelukast 10 MG tablet Commonly known  as: SINGULAIR Take 10 mg by mouth at bedtime.   multivitamin with minerals tablet Take 1 tablet by mouth daily.   PROBIOTIC DAILY PO Take 1 capsule by mouth daily.   vitamin C 500 MG tablet Commonly known as: ASCORBIC ACID Take 500 mg by mouth daily.   vitamin E 400 UNIT capsule Take 400 Units by mouth daily.       Diagnostic Studies: Dg Knee Left Port  Result Date: 02/03/2019 CLINICAL DATA:  68 year old female with fall and left knee pain. EXAM: PORTABLE LEFT KNEE - 1-2 VIEW COMPARISON:  Left knee radiograph dated 07/20/2018. FINDINGS: There is a total left knee arthroplasty. The arthroplasty components appear intact and in anatomic alignment. No evidence of loosening. There is no acute fracture or dislocation. The bones are osteopenic. Postsurgical changes including joint effusion and air as well as cutaneous staples over the anterior knee. IMPRESSION: 1. No acute fracture or dislocation. 2. Total left knee arthroplasty appears intact and in anatomic alignment. Electronically Signed   By: Anner Crete M.D.   On: 02/03/2019 17:14    Disposition:     Follow-up Information    Mcarthur Rossetti, MD Follow up in 2 week(s).   Specialty: Orthopedic Surgery Contact information: [place] [address] [phone]        [facility] Follow up.   Specialty: Home Health Services Why: agency will provide home health physical therapy. agency will call you to schedule first visit.  Contact information: [address] [place] [address] [phone]            Signed: Erskine Emery 02/06/2019, 1:50 PM

## 2019-02-07 DIAGNOSIS — Z471 Aftercare following joint replacement surgery: Secondary | ICD-10-CM | POA: Diagnosis not present

## 2019-02-07 DIAGNOSIS — Z96652 Presence of left artificial knee joint: Secondary | ICD-10-CM | POA: Diagnosis not present

## 2019-02-07 DIAGNOSIS — H409 Unspecified glaucoma: Secondary | ICD-10-CM | POA: Diagnosis not present

## 2019-02-07 DIAGNOSIS — G8929 Other chronic pain: Secondary | ICD-10-CM | POA: Diagnosis not present

## 2019-02-07 DIAGNOSIS — Z8781 Personal history of (healed) traumatic fracture: Secondary | ICD-10-CM | POA: Diagnosis not present

## 2019-02-07 DIAGNOSIS — I10 Essential (primary) hypertension: Secondary | ICD-10-CM | POA: Diagnosis not present

## 2019-02-07 DIAGNOSIS — F329 Major depressive disorder, single episode, unspecified: Secondary | ICD-10-CM | POA: Diagnosis not present

## 2019-02-07 DIAGNOSIS — Z87891 Personal history of nicotine dependence: Secondary | ICD-10-CM | POA: Diagnosis not present

## 2019-02-07 DIAGNOSIS — Z7982 Long term (current) use of aspirin: Secondary | ICD-10-CM | POA: Diagnosis not present

## 2019-02-09 ENCOUNTER — Telehealth: Payer: Self-pay | Admitting: Orthopaedic Surgery

## 2019-02-09 DIAGNOSIS — I10 Essential (primary) hypertension: Secondary | ICD-10-CM | POA: Diagnosis not present

## 2019-02-09 DIAGNOSIS — Z7982 Long term (current) use of aspirin: Secondary | ICD-10-CM | POA: Diagnosis not present

## 2019-02-09 DIAGNOSIS — G8929 Other chronic pain: Secondary | ICD-10-CM | POA: Diagnosis not present

## 2019-02-09 DIAGNOSIS — Z8781 Personal history of (healed) traumatic fracture: Secondary | ICD-10-CM | POA: Diagnosis not present

## 2019-02-09 DIAGNOSIS — H409 Unspecified glaucoma: Secondary | ICD-10-CM | POA: Diagnosis not present

## 2019-02-09 DIAGNOSIS — F329 Major depressive disorder, single episode, unspecified: Secondary | ICD-10-CM | POA: Diagnosis not present

## 2019-02-09 DIAGNOSIS — Z87891 Personal history of nicotine dependence: Secondary | ICD-10-CM | POA: Diagnosis not present

## 2019-02-09 DIAGNOSIS — Z471 Aftercare following joint replacement surgery: Secondary | ICD-10-CM | POA: Diagnosis not present

## 2019-02-09 DIAGNOSIS — Z96652 Presence of left artificial knee joint: Secondary | ICD-10-CM | POA: Diagnosis not present

## 2019-02-09 MED ORDER — HYDROMORPHONE HCL 2 MG PO TABS: 2.0000 mg | ORAL_TABLET | ORAL | 0 refills | Status: DC | PRN

## 2019-02-09 NOTE — Telephone Encounter (Signed)
I sent in some more 

## 2019-02-09 NOTE — Telephone Encounter (Signed)
Please advise 

## 2019-02-09 NOTE — Telephone Encounter (Signed)
Patient called requesting an RX refill on her Hydromorphone.  Patient uses [facility] on [address].  CB#[phone].  Thank you.

## 2019-02-13 DIAGNOSIS — Z8781 Personal history of (healed) traumatic fracture: Secondary | ICD-10-CM | POA: Diagnosis not present

## 2019-02-13 DIAGNOSIS — Z471 Aftercare following joint replacement surgery: Secondary | ICD-10-CM | POA: Diagnosis not present

## 2019-02-13 DIAGNOSIS — Z96652 Presence of left artificial knee joint: Secondary | ICD-10-CM | POA: Diagnosis not present

## 2019-02-13 DIAGNOSIS — G8929 Other chronic pain: Secondary | ICD-10-CM | POA: Diagnosis not present

## 2019-02-13 DIAGNOSIS — I10 Essential (primary) hypertension: Secondary | ICD-10-CM | POA: Diagnosis not present

## 2019-02-13 DIAGNOSIS — Z87891 Personal history of nicotine dependence: Secondary | ICD-10-CM | POA: Diagnosis not present

## 2019-02-13 DIAGNOSIS — Z7982 Long term (current) use of aspirin: Secondary | ICD-10-CM | POA: Diagnosis not present

## 2019-02-13 DIAGNOSIS — F329 Major depressive disorder, single episode, unspecified: Secondary | ICD-10-CM | POA: Diagnosis not present

## 2019-02-13 DIAGNOSIS — H409 Unspecified glaucoma: Secondary | ICD-10-CM | POA: Diagnosis not present

## 2019-02-13 NOTE — Progress Notes (Signed)
Patient referred by Mayra Neer, MD for tachcyardia  Subjective:   Michelle Johnston, female    DOB: 26-Oct-1950, 68 y.o.   MRN: [id number]   Chief Complaint  Patient presents with  . Tachycardia  . New Patient (Initial Visit)     HPI  68 y.o. Caucasian female with controlled hypertension, h/o tobacco abuse, depression, referred for evaluation of tachycardia.  Patient recently underwent eft knee surgery. During hospitalization, she reportedly had fast heart rate for two days. This was not caught on EKG. She denies chest pain, shortness of breath, palpitations, leg edema, orthopnea, PND, TIA/syncope. Prior to knee surgery, she was active with water aerobics and denies having any symptoms.    Past Medical History:  Diagnosis Date  . Arthritis   . Bradycardia   . Bronchitis   . Depression   . Glaucoma   . Hypertension   . Seasonal allergies   . Wears glasses      Past Surgical History:  Procedure Laterality Date  . ABDOMINAL HYSTERECTOMY  1990  . BREAST BIOPSY Right    benign  . COLONOSCOPY    . meniscus tear knee     left knee  . NASAL SINUS SURGERY  1975  . ORIF ANKLE FRACTURE  2009   left  . TOTAL KNEE ARTHROPLASTY Left 02/03/2019   Procedure: LEFT TOTAL KNEE ARTHROPLASTY;  Surgeon: Mcarthur Rossetti, MD;  Location: [facility];  Service: Orthopedics;  Laterality: Left;  . TURBINATE REDUCTION Bilateral 06/04/2014   Procedure: BILATERAL TURBINATE REDUCTION;  Surgeon: Leta Baptist, MD;  Location: [facility];  Service: ENT;  Laterality: Bilateral;  . TYMPANOSTOMY TUBE PLACEMENT     left     Social History   Tobacco Use  Smoking Status Former Smoker  . Quit date: 05/28/1998  . Years since quitting: 20.7  Smokeless Tobacco Never Used    Social History   Substance and Sexual Activity  Alcohol Use Yes   Comment: daily-wine daily-1 glass     Family History  Problem Relation Age of Onset  . Hypertension Mother   . Hyperlipidemia Mother   .  Lymphoma Mother   . Lung cancer Father      Current Outpatient Medications on File Prior to Visit  Medication Sig Dispense Refill  . acetaminophen (TYLENOL) 500 MG tablet Take 1,000 mg by mouth every 6 (six) hours as needed. pain    . albuterol (VENTOLIN HFA) 108 (90 Base) MCG/ACT inhaler Inhale 1-2 puffs into the lungs every 6 (six) hours as needed for wheezing or shortness of breath.    Marland Kitchen amLODipine (NORVASC) 5 MG tablet Take 5 mg by mouth at bedtime.     Marland Kitchen aspirin 81 MG chewable tablet Chew 1 tablet (81 mg total) by mouth 2 (two) times daily. 30 tablet 0  . b complex vitamins tablet Take 1 tablet by mouth daily.    . calcium carbonate (OS-CAL - DOSED IN MG OF ELEMENTAL CALCIUM) 1250 (500 Ca) MG tablet Take 1 tablet by mouth daily with breakfast.    . cetirizine (ZYRTEC) 10 MG tablet Take 10 mg by mouth at bedtime.    . cholecalciferol (VITAMIN D) 1000 UNITS tablet Take 1,000 Units by mouth daily.    Marland Kitchen FLUoxetine (PROZAC) 20 MG capsule Take 40 mg by mouth daily.     . Fluticasone-Salmeterol (ADVAIR) 250-50 MCG/DOSE AEPB Inhale 1 puff into the lungs 2 (two) times daily as needed (asthma).    . guaiFENesin (MUCINEX) 600  MG 12 hr tablet Take 600 mg by mouth daily as needed.     Marland Kitchen HYDROmorphone (DILAUDID) 2 MG tablet Take 1 tablet (2 mg total) by mouth every 4 (four) hours as needed for severe pain. 30 tablet 0  . latanoprost (XALATAN) 0.005 % ophthalmic solution Place 1 drop into both eyes at bedtime.    . meloxicam (MOBIC) 15 MG tablet Take 15 mg by mouth as needed.     . methocarbamol (ROBAXIN) 500 MG tablet Take 1 tablet (500 mg total) by mouth every 6 (six) hours as needed for muscle spasms. 40 tablet 1  . montelukast (SINGULAIR) 10 MG tablet Take 10 mg by mouth at bedtime.    . Multiple Vitamins-Minerals (MULTIVITAMIN WITH MINERALS) tablet Take 1 tablet by mouth daily.    . Omega-3 Fatty Acids (FISH OIL) 1000 MG CAPS Take 1,000 mg by mouth daily.    . Probiotic Product (PROBIOTIC DAILY  PO) Take 1 capsule by mouth daily.    . vitamin C (ASCORBIC ACID) 500 MG tablet Take 500 mg by mouth daily.     No current facility-administered medications on file prior to visit.    Cardiovascular and other pertinent studies:  EKG 12/21/220: Sinus rhythm 75 bpm. Incomplete RBBB. LAFB. FIrst degree AV block.  EKG 02/03/2019: Sinus rhythm 96 bpm.  Incomplete RBBB. LAFB.  First degree AV block.    Recent labs: 02/04/2019: Glucose 164, BUN/Cr 13/0.42. EGFR >60. Na/K 131/3.5. Rest of the CMP normal H/H 10.9/33.1. MCV 100.9. Platelets 222   11/21/2018: Glucose 83, BUN/Cr 11/0.51. EGFR normal. Na/K 133/4.3. Rest of the CMP normal Chol 244, TG 58, HDL 94, LDL 139   Review of Systems  Constitution: Negative for decreased appetite, malaise/fatigue, weight gain and weight loss.  HENT: Negative for congestion.   Eyes: Negative for visual disturbance.  Cardiovascular: Negative for chest pain, dyspnea on exertion, leg swelling, palpitations and syncope.  Respiratory: Negative for cough.   Endocrine: Negative for cold intolerance.  Hematologic/Lymphatic: Does not bruise/bleed easily.  Skin: Negative for itching and rash.  Musculoskeletal: Positive for joint pain. Negative for myalgias.  Gastrointestinal: Negative for abdominal pain, nausea and vomiting.  Genitourinary: Negative for dysuria.  Neurological: Negative for dizziness and weakness.  Psychiatric/Behavioral: The patient is not nervous/anxious.   All other systems reviewed and are negative.        Vitals:   02/20/19 1258 02/20/19 1314  BP: (!) 152/74 139/71  Pulse: 78   Temp: 98.1 F (36.7 C)   SpO2: 98%      Body mass index is 25.29 kg/m. Filed Weights   02/20/19 1258  Weight: 152 lb (68.9 kg)     Objective:   Physical Exam  Constitutional: She is oriented to person, place, and time. She appears well-developed and well-nourished. No distress.  HENT:  Head: Normocephalic and atraumatic.  Eyes: Pupils  are equal, round, and reactive to light. Conjunctivae are normal.  Neck: No JVD present.  Cardiovascular: Normal rate, regular rhythm and intact distal pulses.  Pulmonary/Chest: Effort normal and breath sounds normal. She has no wheezes. She has no rales.  Abdominal: Soft. Bowel sounds are normal. There is no rebound.  Musculoskeletal:        General: No edema (Trace LLE).  Lymphadenopathy:    She has no cervical adenopathy.  Neurological: She is alert and oriented to person, place, and time. No cranial nerve deficit.  Skin: Skin is warm and dry.  Psychiatric: She has a normal mood and affect.  Nursing note and vitals reviewed.         Assessment & Recommendations:   68 y.o. Caucasian female with controlled hypertension, h/o tobacco abuse, depression, referred for evaluation of tachycardia.  Tachycardia: Reported episode post-op. Not seen on EKG. She has sinus rhythm, LAFB, incomplete RBBB, first degree AV block. P wave has sharp uprise, suggesting possibly ectopic atrial origin, not seen today. She may have had an episode of atrial tachycardia. Given that she is currently asymptomatic, I will only obtain echocardiogram to rule put structural abnormality. I have recommended her to contact us, should she have any recurrence. In that case, she will need repeat EKG and event monitor.   Hypertension: Well controlled.  I will see her on as needed basis, depending on her symptoms.   Thank you for referring the patient to [place]. Please feel free to contact with any questions.  Nigel Mormon, MD [facility] Pager: [phone] Office: [phone]

## 2019-02-15 DIAGNOSIS — Z7982 Long term (current) use of aspirin: Secondary | ICD-10-CM | POA: Diagnosis not present

## 2019-02-15 DIAGNOSIS — Z8781 Personal history of (healed) traumatic fracture: Secondary | ICD-10-CM | POA: Diagnosis not present

## 2019-02-15 DIAGNOSIS — G8929 Other chronic pain: Secondary | ICD-10-CM | POA: Diagnosis not present

## 2019-02-15 DIAGNOSIS — Z87891 Personal history of nicotine dependence: Secondary | ICD-10-CM | POA: Diagnosis not present

## 2019-02-15 DIAGNOSIS — H409 Unspecified glaucoma: Secondary | ICD-10-CM | POA: Diagnosis not present

## 2019-02-15 DIAGNOSIS — F329 Major depressive disorder, single episode, unspecified: Secondary | ICD-10-CM | POA: Diagnosis not present

## 2019-02-15 DIAGNOSIS — I10 Essential (primary) hypertension: Secondary | ICD-10-CM | POA: Diagnosis not present

## 2019-02-15 DIAGNOSIS — Z471 Aftercare following joint replacement surgery: Secondary | ICD-10-CM | POA: Diagnosis not present

## 2019-02-15 DIAGNOSIS — Z96652 Presence of left artificial knee joint: Secondary | ICD-10-CM | POA: Diagnosis not present

## 2019-02-16 ENCOUNTER — Inpatient Hospital Stay: Payer: Medicare HMO | Admitting: Orthopaedic Surgery

## 2019-02-16 ENCOUNTER — Telehealth: Payer: Self-pay | Admitting: Orthopaedic Surgery

## 2019-02-16 MED ORDER — HYDROMORPHONE HCL 2 MG PO TABS: 2.0000 mg | ORAL_TABLET | ORAL | 0 refills | Status: DC | PRN

## 2019-02-16 NOTE — Telephone Encounter (Signed)
Patient requested an RX refill on her Hydrocodone.  CB#[phone].  Thank you.

## 2019-02-16 NOTE — Telephone Encounter (Signed)
Please advise 

## 2019-02-17 DIAGNOSIS — H409 Unspecified glaucoma: Secondary | ICD-10-CM | POA: Diagnosis not present

## 2019-02-17 DIAGNOSIS — Z87891 Personal history of nicotine dependence: Secondary | ICD-10-CM | POA: Diagnosis not present

## 2019-02-17 DIAGNOSIS — Z7982 Long term (current) use of aspirin: Secondary | ICD-10-CM | POA: Diagnosis not present

## 2019-02-17 DIAGNOSIS — Z8781 Personal history of (healed) traumatic fracture: Secondary | ICD-10-CM | POA: Diagnosis not present

## 2019-02-17 DIAGNOSIS — F329 Major depressive disorder, single episode, unspecified: Secondary | ICD-10-CM | POA: Diagnosis not present

## 2019-02-17 DIAGNOSIS — Z471 Aftercare following joint replacement surgery: Secondary | ICD-10-CM | POA: Diagnosis not present

## 2019-02-17 DIAGNOSIS — G8929 Other chronic pain: Secondary | ICD-10-CM | POA: Diagnosis not present

## 2019-02-17 DIAGNOSIS — Z96652 Presence of left artificial knee joint: Secondary | ICD-10-CM | POA: Diagnosis not present

## 2019-02-17 DIAGNOSIS — I10 Essential (primary) hypertension: Secondary | ICD-10-CM | POA: Diagnosis not present

## 2019-02-20 ENCOUNTER — Other Ambulatory Visit: Payer: Self-pay

## 2019-02-20 ENCOUNTER — Encounter: Payer: Self-pay | Admitting: Cardiology

## 2019-02-20 ENCOUNTER — Ambulatory Visit: Payer: Medicare HMO | Admitting: Cardiology

## 2019-02-20 VITALS — BP 139/71 | HR 78 | Temp 98.1°F | Ht 65.0 in | Wt 152.0 lb

## 2019-02-20 DIAGNOSIS — R9431 Abnormal electrocardiogram [ECG] [EKG]: Secondary | ICD-10-CM | POA: Diagnosis not present

## 2019-02-20 DIAGNOSIS — I10 Essential (primary) hypertension: Secondary | ICD-10-CM

## 2019-02-20 DIAGNOSIS — R000 Tachycardia, unspecified: Secondary | ICD-10-CM

## 2019-02-21 DIAGNOSIS — Z96652 Presence of left artificial knee joint: Secondary | ICD-10-CM | POA: Diagnosis not present

## 2019-02-21 DIAGNOSIS — I10 Essential (primary) hypertension: Secondary | ICD-10-CM | POA: Diagnosis not present

## 2019-02-21 DIAGNOSIS — Z8781 Personal history of (healed) traumatic fracture: Secondary | ICD-10-CM | POA: Diagnosis not present

## 2019-02-21 DIAGNOSIS — F329 Major depressive disorder, single episode, unspecified: Secondary | ICD-10-CM | POA: Diagnosis not present

## 2019-02-21 DIAGNOSIS — Z87891 Personal history of nicotine dependence: Secondary | ICD-10-CM | POA: Diagnosis not present

## 2019-02-21 DIAGNOSIS — H409 Unspecified glaucoma: Secondary | ICD-10-CM | POA: Diagnosis not present

## 2019-02-21 DIAGNOSIS — Z7982 Long term (current) use of aspirin: Secondary | ICD-10-CM | POA: Diagnosis not present

## 2019-02-21 DIAGNOSIS — G8929 Other chronic pain: Secondary | ICD-10-CM | POA: Diagnosis not present

## 2019-02-21 DIAGNOSIS — Z471 Aftercare following joint replacement surgery: Secondary | ICD-10-CM | POA: Diagnosis not present

## 2019-02-22 ENCOUNTER — Encounter: Payer: Self-pay | Admitting: Orthopaedic Surgery

## 2019-02-22 ENCOUNTER — Ambulatory Visit (INDEPENDENT_AMBULATORY_CARE_PROVIDER_SITE_OTHER): Payer: Medicare HMO | Admitting: Orthopaedic Surgery

## 2019-02-22 ENCOUNTER — Other Ambulatory Visit: Payer: Self-pay

## 2019-02-22 DIAGNOSIS — Z96652 Presence of left artificial knee joint: Secondary | ICD-10-CM

## 2019-02-22 MED ORDER — HYDROMORPHONE HCL 2 MG PO TABS: 2.0000 mg | ORAL_TABLET | Freq: Four times a day (QID) | ORAL | 0 refills | Status: DC | PRN

## 2019-02-22 NOTE — Progress Notes (Signed)
The patient is just over 2 weeks status post a left total knee arthroplasty.  She is doing well and actually has already been released from therapy home health and even not needing outpatient therapy.  She is still needing Dilaudid for pain but overall her function is doing great.  She is ambulate with a cane.  On examination today I removed her staples in place Steri-Strips.  The calf is soft on the left side.  Her extension is full and her flexion is to 120 degrees.  There is swelling to be expected postoperative.  I am very surprised at how she has done and very pleased.  She is pleased as well.  I will still refill her Dilaudid 1 more time and then we can try to transition to oxycodone from there.  Regardless, she does not need outpatient therapy since she is doing so well on her own.  We will see her back in 4 weeks to see how she is doing overall but no x-rays are needed.  All question concerns were answered and addressed.

## 2019-02-23 DIAGNOSIS — G8929 Other chronic pain: Secondary | ICD-10-CM | POA: Diagnosis not present

## 2019-02-23 DIAGNOSIS — Z471 Aftercare following joint replacement surgery: Secondary | ICD-10-CM | POA: Diagnosis not present

## 2019-02-23 DIAGNOSIS — H409 Unspecified glaucoma: Secondary | ICD-10-CM | POA: Diagnosis not present

## 2019-02-23 DIAGNOSIS — I10 Essential (primary) hypertension: Secondary | ICD-10-CM | POA: Diagnosis not present

## 2019-02-23 DIAGNOSIS — Z87891 Personal history of nicotine dependence: Secondary | ICD-10-CM | POA: Diagnosis not present

## 2019-02-23 DIAGNOSIS — Z96652 Presence of left artificial knee joint: Secondary | ICD-10-CM | POA: Diagnosis not present

## 2019-02-23 DIAGNOSIS — F329 Major depressive disorder, single episode, unspecified: Secondary | ICD-10-CM | POA: Diagnosis not present

## 2019-02-23 DIAGNOSIS — Z7982 Long term (current) use of aspirin: Secondary | ICD-10-CM | POA: Diagnosis not present

## 2019-02-23 DIAGNOSIS — Z8781 Personal history of (healed) traumatic fracture: Secondary | ICD-10-CM | POA: Diagnosis not present

## 2019-02-28 DIAGNOSIS — E7800 Pure hypercholesterolemia, unspecified: Secondary | ICD-10-CM | POA: Diagnosis not present

## 2019-02-28 DIAGNOSIS — I10 Essential (primary) hypertension: Secondary | ICD-10-CM | POA: Diagnosis not present

## 2019-02-28 DIAGNOSIS — J42 Unspecified chronic bronchitis: Secondary | ICD-10-CM | POA: Diagnosis not present

## 2019-02-28 DIAGNOSIS — Z7189 Other specified counseling: Secondary | ICD-10-CM | POA: Diagnosis not present

## 2019-02-28 DIAGNOSIS — Z20828 Contact with and (suspected) exposure to other viral communicable diseases: Secondary | ICD-10-CM | POA: Diagnosis not present

## 2019-03-07 ENCOUNTER — Ambulatory Visit (INDEPENDENT_AMBULATORY_CARE_PROVIDER_SITE_OTHER): Payer: Medicare HMO

## 2019-03-07 ENCOUNTER — Other Ambulatory Visit: Payer: Self-pay

## 2019-03-07 DIAGNOSIS — R9431 Abnormal electrocardiogram [ECG] [EKG]: Secondary | ICD-10-CM

## 2019-03-12 ENCOUNTER — Other Ambulatory Visit: Payer: Self-pay | Admitting: Cardiology

## 2019-03-12 DIAGNOSIS — I351 Nonrheumatic aortic (valve) insufficiency: Secondary | ICD-10-CM

## 2019-03-12 DIAGNOSIS — I340 Nonrheumatic mitral (valve) insufficiency: Secondary | ICD-10-CM

## 2019-03-12 NOTE — Progress Notes (Signed)
Echo and f/u in 6 months for AR, MR.   Thanks MJP

## 2019-03-13 NOTE — Progress Notes (Signed)
I called and spoke with patient, and let her know that she had normal heart function, so she asked, why did she have to come back in 6 months, so I told her that there was moderate leaking and she asked what is leaking from the valves. Can you explain to me or to the patient, to better explain it to her, what it means thank you.

## 2019-03-13 NOTE — Progress Notes (Signed)
Spoke to the patient. You need to call.

## 2019-03-16 ENCOUNTER — Telehealth: Payer: Self-pay

## 2019-03-16 NOTE — Telephone Encounter (Signed)
LMOM at the dentist office letting them know I was calling back Told them if they happened to be calling for antibiotics for her total replacement then she needs them for 3 months after surgery

## 2019-03-16 NOTE — Telephone Encounter (Signed)
Dr. Marcello Moores called stating that she would like a call back concerning this patient.  Did not give any other information.  Cb# [phone].  Please advise.  Thank you.

## 2019-03-22 ENCOUNTER — Encounter: Payer: Self-pay | Admitting: Orthopaedic Surgery

## 2019-03-22 ENCOUNTER — Ambulatory Visit (INDEPENDENT_AMBULATORY_CARE_PROVIDER_SITE_OTHER): Payer: Medicare HMO | Admitting: Orthopaedic Surgery

## 2019-03-22 ENCOUNTER — Other Ambulatory Visit: Payer: Self-pay

## 2019-03-22 DIAGNOSIS — Z96652 Presence of left artificial knee joint: Secondary | ICD-10-CM

## 2019-03-22 NOTE — Progress Notes (Signed)
The patient is now 6 weeks status post a left total knee arthroplasty.  She has no complaints.  She is already been released from therapy.  She reports good strength and good motion as well as minimal swelling.  On exam her extension is full of her left knee and her flexion is full.  The knee is ligamentously stable.  There is no significant swelling and her incisions healed.  The knee feels ligamentously stable.  At this point we can see her back at 6 months unless there are issues.  All question concerns were answered and addressed.  At that visit I would like an AP and lateral of her left operative knee.

## 2019-05-22 DIAGNOSIS — M545 Low back pain: Secondary | ICD-10-CM | POA: Diagnosis not present

## 2019-05-22 DIAGNOSIS — M47817 Spondylosis without myelopathy or radiculopathy, lumbosacral region: Secondary | ICD-10-CM | POA: Diagnosis not present

## 2019-05-22 DIAGNOSIS — M5136 Other intervertebral disc degeneration, lumbar region: Secondary | ICD-10-CM | POA: Diagnosis not present

## 2019-05-23 DIAGNOSIS — H02831 Dermatochalasis of right upper eyelid: Secondary | ICD-10-CM | POA: Diagnosis not present

## 2019-05-23 DIAGNOSIS — H02834 Dermatochalasis of left upper eyelid: Secondary | ICD-10-CM | POA: Diagnosis not present

## 2019-05-23 DIAGNOSIS — D3132 Benign neoplasm of left choroid: Secondary | ICD-10-CM | POA: Diagnosis not present

## 2019-05-23 DIAGNOSIS — H401332 Pigmentary glaucoma, bilateral, moderate stage: Secondary | ICD-10-CM | POA: Diagnosis not present

## 2019-05-23 DIAGNOSIS — Z961 Presence of intraocular lens: Secondary | ICD-10-CM | POA: Diagnosis not present

## 2019-06-05 ENCOUNTER — Other Ambulatory Visit: Payer: Self-pay

## 2019-06-05 ENCOUNTER — Telehealth: Payer: Self-pay | Admitting: Orthopaedic Surgery

## 2019-06-05 MED ORDER — AMOXICILLIN 500 MG PO TABS: ORAL_TABLET | ORAL | 0 refills | Status: DC

## 2019-06-05 NOTE — Progress Notes (Signed)
amox

## 2019-06-05 NOTE — Telephone Encounter (Signed)
Patient called. She is having a dental procedure done. Would like an antibiotic called in for her. Her call back number [phone]

## 2019-06-09 DIAGNOSIS — I10 Essential (primary) hypertension: Secondary | ICD-10-CM | POA: Diagnosis not present

## 2019-06-09 DIAGNOSIS — I4891 Unspecified atrial fibrillation: Secondary | ICD-10-CM | POA: Diagnosis not present

## 2019-06-09 DIAGNOSIS — J42 Unspecified chronic bronchitis: Secondary | ICD-10-CM | POA: Diagnosis not present

## 2019-06-09 DIAGNOSIS — I351 Nonrheumatic aortic (valve) insufficiency: Secondary | ICD-10-CM | POA: Diagnosis not present

## 2019-06-09 DIAGNOSIS — I499 Cardiac arrhythmia, unspecified: Secondary | ICD-10-CM | POA: Diagnosis not present

## 2019-06-16 ENCOUNTER — Ambulatory Visit: Payer: Medicare HMO | Admitting: Cardiology

## 2019-06-16 ENCOUNTER — Encounter: Payer: Self-pay | Admitting: Cardiology

## 2019-06-16 ENCOUNTER — Other Ambulatory Visit: Payer: Self-pay

## 2019-06-16 VITALS — BP 145/90 | HR 98 | Ht 65.0 in | Wt 152.0 lb

## 2019-06-16 DIAGNOSIS — I340 Nonrheumatic mitral (valve) insufficiency: Secondary | ICD-10-CM | POA: Insufficient documentation

## 2019-06-16 DIAGNOSIS — I351 Nonrheumatic aortic (valve) insufficiency: Secondary | ICD-10-CM | POA: Insufficient documentation

## 2019-06-16 DIAGNOSIS — I480 Paroxysmal atrial fibrillation: Secondary | ICD-10-CM | POA: Diagnosis not present

## 2019-06-16 MED ORDER — METOPROLOL TARTRATE 25 MG PO TABS: 25.0000 mg | ORAL_TABLET | Freq: Two times a day (BID) | ORAL | 3 refills | Status: DC

## 2019-06-16 MED ORDER — RIVAROXABAN 20 MG PO TABS: 20.0000 mg | ORAL_TABLET | Freq: Every day | ORAL | Status: DC

## 2019-06-16 NOTE — Progress Notes (Signed)
Patient referred by Mayra Neer, MD for tachcyardia  Subjective:   Michelle Johnston, female    DOB: Jun 20, 1950, 69 y.o.   MRN: [id number]   Chief Complaint  Patient presents with  . Atrial Fibrillation     HPI  69 y.o. Caucasian female with hypertension, mod AI, mod MR, new diagnosis of Afib, h/o tobacco abuse, depression.  I saw the patient in 01/2019. She had reportedly had episodes of tachycardia post-op after her knee surgery, were not caught on EKG. Her EKG with me had showed sinus rhythm, LAFB, incomplete RBBB, first degree AV block. P wave had sharp uprise, suggesting possibly ectopic atrial origin, not seen again in 01/2019. I suspected she may have paroxysmal atrial tachycardia or Afib. Given that she was then asymptomatic, I obtained echocardiogram to rule out structural abnormality, and recommended her to contact us, should she have any recurrence.  She was seen by PCP Dr. Brigitte Pulse in April and was found to have Afib on EKG. she denies any symptoms of palpitations or chest pain.  She possibly has some exertional dyspnea with walking.  Patient drinks 2 glasses of wine every day.  She denies any melena or hematochezia.  MCV is noted to be elevated at 100.   Current Outpatient Medications on File Prior to Visit  Medication Sig Dispense Refill  . acetaminophen (TYLENOL) 500 MG tablet Take 1,000 mg by mouth every 6 (six) hours as needed. pain    . albuterol (VENTOLIN HFA) 108 (90 Base) MCG/ACT inhaler Inhale 1-2 puffs into the lungs every 6 (six) hours as needed for wheezing or shortness of breath.    Marland Kitchen amLODipine (NORVASC) 5 MG tablet Take 5 mg by mouth at bedtime.     Marland Kitchen amoxicillin (AMOXIL) 500 MG tablet Take 2 tabs by mouth one hour before dental appointment, then 2 tabs by mouth six hours after appointment 4 tablet 0  . aspirin 81 MG chewable tablet Chew 1 tablet (81 mg total) by mouth 2 (two) times daily. 30 tablet 0  . b complex vitamins tablet Take 1 tablet by mouth daily.     . calcium carbonate (OS-CAL - DOSED IN MG OF ELEMENTAL CALCIUM) 1250 (500 Ca) MG tablet Take 1 tablet by mouth daily with breakfast.    . cetirizine (ZYRTEC) 10 MG tablet Take 10 mg by mouth at bedtime.    . cholecalciferol (VITAMIN D) 1000 UNITS tablet Take 1,000 Units by mouth daily.    Marland Kitchen FLUoxetine (PROZAC) 20 MG capsule Take 40 mg by mouth daily.     . Fluticasone-Salmeterol (ADVAIR) 250-50 MCG/DOSE AEPB Inhale 1 puff into the lungs 2 (two) times daily as needed (asthma).    . guaiFENesin (MUCINEX) 600 MG 12 hr tablet Take 600 mg by mouth daily as needed.     Marland Kitchen HYDROmorphone (DILAUDID) 2 MG tablet Take 1 tablet (2 mg total) by mouth every 6 (six) hours as needed for severe pain. 30 tablet 0  . latanoprost (XALATAN) 0.005 % ophthalmic solution Place 1 drop into both eyes at bedtime.    . meloxicam (MOBIC) 15 MG tablet Take 15 mg by mouth as needed.     . methocarbamol (ROBAXIN) 500 MG tablet Take 1 tablet (500 mg total) by mouth every 6 (six) hours as needed for muscle spasms. 40 tablet 1  . montelukast (SINGULAIR) 10 MG tablet Take 10 mg by mouth at bedtime.    . Multiple Vitamins-Minerals (MULTIVITAMIN WITH MINERALS) tablet Take 1 tablet by mouth daily.    Marland Kitchen  Omega-3 Fatty Acids (FISH OIL) 1000 MG CAPS Take 1,000 mg by mouth daily.    . Probiotic Product (PROBIOTIC DAILY PO) Take 1 capsule by mouth daily.    . vitamin C (ASCORBIC ACID) 500 MG tablet Take 500 mg by mouth daily.     No current facility-administered medications on file prior to visit.    Cardiovascular and other pertinent studies:  EKG 06/16/2019: Atrial fibrillation 90 bpm. Right bundle branch block. Left anterior fascicular block.   Echocardiogram 03/07/2019:  Normal LV systolic function with visual EF 55-60%. Mild concentric  hypertrophy of the left ventricle. Left ventricle cavity is normal in  size. Normal global wall motion. Doppler evidence of grade I (impaired)  diastolic dysfunction, normal LAP.  Trileaflet  aortic valve. Moderate (Grade III) aortic regurgitation.  Mild mitral valve leaflet thickening. Moderate (Grade III) mitral  regurgitation.  Mild tricuspid regurgitation. Estimated pulmonary artery systolic pressure  is 24 mmHg.  EKG 06/09/2019: Atrial fibrillation 101 bpm. Incomplete RBBB. LAFB.  EKG 12/21/220: Sinus rhythm 75 bpm. Incomplete RBBB. LAFB. FIrst degree AV block.  EKG 02/03/2019: Sinus rhythm 96 bpm.  Incomplete RBBB. LAFB.  First degree AV block.    Recent labs: 06/09/2019: Glucose 87, BUN/Cr 16/0.65. EGFR 90. Na/K 134/4.3. Rest of the CMP normal H/H 13/38. MCV 100. Platelets 311 TSH 1.9 normal   02/04/2019: Glucose 164, BUN/Cr 13/0.42. EGFR >60. Na/K 131/3.5. Rest of the CMP normal H/H 10.9/33.1. MCV 100.9. Platelets 222   11/21/2018: Glucose 83, BUN/Cr 11/0.51. EGFR normal. Na/K 133/4.3. Rest of the CMP normal Chol 244, TG 58, HDL 94, LDL 139   Review of Systems  Cardiovascular: Negative for chest pain, dyspnea on exertion, leg swelling, palpitations and syncope.  Musculoskeletal: Positive for joint pain.         Vitals:   06/16/19 1332  BP: (!) 162/86  Pulse: 97  SpO2: 98%     Body mass index is 25.29 kg/m. Filed Weights   06/16/19 1332  Weight: 152 lb (68.9 kg)     Objective:   Physical Exam  Constitutional: She appears well-developed and well-nourished.  Neck: No JVD present.  Cardiovascular: Normal rate, regular rhythm, normal heart sounds and intact distal pulses.  No murmur heard. Pulmonary/Chest: Effort normal and breath sounds normal. She has no wheezes. She has no rales.  Musculoskeletal:        General: No edema.  Nursing note and vitals reviewed.         Assessment & Recommendations:   69 y.o. Caucasian female with hypertension, mod AI, mod MR, new diagnosis of Afib, h/o tobacco abuse, depression.  Paroxysmal Afib: Ventricular rate around 100 bpm.  Started metoprolol.  Recommend nuclear study. CHA2DS2VASc  score 3, annual stroke risk 3.6% Recommend Xarelto 20 mg daily. Patient is going to check with her insurance regarding coverage.  Provided patients with samples for 6 weeks. Cautioned regarding avoiding use of NSAIDs, and reducing alcohol intake. I will see her back in 4 weeks.  If she stays in atrial fibrillation, will attempt cardioversion.  If she is not able to sustain sinus rhythm, in absence of profound symptoms, do not recommend antiarrhythmic therapy.  Will consider ischemia evaluation after conversion to sinus rhythm.  Mod AI, mod MR: Clinically stable.  Has repeat echocardiogram scheduled for July 2020.  Hypertension:  Added metoprolol as above.  Nigel Mormon, MD [facility] Pager: [phone] Office: [phone]

## 2019-06-27 ENCOUNTER — Telehealth: Payer: Self-pay

## 2019-06-27 NOTE — Telephone Encounter (Signed)
Spoke with patient. Patient voiced understanding. Patient will continue to take Amlodipine as well. Thank you.

## 2019-06-27 NOTE — Telephone Encounter (Signed)
Okay to use Metop 1/2 BID, but to continue Amlodipine

## 2019-06-27 NOTE — Telephone Encounter (Signed)
Patient called in regards to a question about Metoprolol. Patient states the 25 mg made her feel very dizzy and weak. Patient has tried to half the medication in half and take half in the morning and half at night. Is that something patient can do instead of taking the full 25 at once? Patient also stated she is no longer taking Amlodipine 5 mg because she wants to know how to take her Metoprolol first . Please advise. Thanks! (Dr Letitia Libra patient)

## 2019-06-28 ENCOUNTER — Ambulatory Visit: Payer: Medicare HMO | Admitting: Physician Assistant

## 2019-06-28 ENCOUNTER — Encounter: Payer: Self-pay | Admitting: Physician Assistant

## 2019-06-28 ENCOUNTER — Ambulatory Visit (INDEPENDENT_AMBULATORY_CARE_PROVIDER_SITE_OTHER): Payer: Medicare HMO

## 2019-06-28 ENCOUNTER — Other Ambulatory Visit: Payer: Self-pay

## 2019-06-28 DIAGNOSIS — M25561 Pain in right knee: Secondary | ICD-10-CM | POA: Diagnosis not present

## 2019-06-28 DIAGNOSIS — Z96652 Presence of left artificial knee joint: Secondary | ICD-10-CM

## 2019-06-28 MED ORDER — METHYLPREDNISOLONE ACETATE 40 MG/ML IJ SUSP
40.0000 mg | INTRAMUSCULAR | Status: AC | PRN
  Administered 2019-06-28: 40 mg via INTRA_ARTICULAR

## 2019-06-28 MED ORDER — LIDOCAINE HCL 1 % IJ SOLN
5.0000 mL | INTRAMUSCULAR | Status: AC | PRN
  Administered 2019-06-28: 5 mL

## 2019-06-28 NOTE — Progress Notes (Addendum)
Office Visit Note   Patient: Michelle Johnston           Date of Birth: [birth year]           MRN: [id number] Visit Date: 06/28/2019              Requested by: Mayra Neer, MD [address] [place],  [place] [address] PCP: Mayra Neer, MD   Assessment & Plan: Visit Diagnoses:  1. Right knee pain, unspecified chronicity   2. Status post total left knee replacement     Plan: She will work on quad strengthening both knees.  Like to see her back in 2 weeks to see what type of response she had to the injection of the right knee also to see how her left knee is doing.  Follow-Up Instructions: Return in about 2 weeks (around 07/12/2019).   Orders:  Orders Placed This Encounter  Procedures  . Large Joint Inj: R knee  . XR KNEE 3 VIEW RIGHT   Meds ordered this encounter  Medications  . methylPREDNISolone acetate (DEPO-MEDROL) injection 40 mg  . lidocaine (XYLOCAINE) 1 % (with pres) injection 5 mL      Procedures: Large Joint Inj: R knee on 06/28/2019 2:05 PM Indications: pain Details: 22 G 1.5 in needle, superolateral approach  Arthrogram: No  Medications: 40 mg methylPREDNISolone acetate 40 MG/ML; 5 mL lidocaine 1 % Aspirate: 35 mL yellow Outcome: tolerated well, no immediate complications Procedure, treatment alternatives, risks and benefits explained, specific risks discussed. Consent was given by the patient. Immediately prior to procedure a time out was called to verify the correct patient, procedure, equipment, support staff and site/side marked as required. Patient was prepped and draped in the usual sterile fashion.       Clinical Data: No additional findings.   Subjective: Chief Complaint  Patient presents with  . Right Knee - Pain    HPI  Michelle Johnston Johnston year old female well-known to Dr. Ninfa Linden service comes in today with right knee pain.  She states she injured it in water aerobics last week.  States she has some giving way weakness in the  knee no other mechanical symptoms.  No waking pain.  Pain does increase with activity she notes no swelling in the knee.  She is status post left total knee arthroplasty 02/03/2019.  She is nondiabetic.  She had her last Covid injection greater than 2 weeks ago.  Review of Systems See HPI otherwise negative or noncontributory.  Objective: Vital Signs: There were no vitals taken for this visit.  Physical Exam Constitutional:      Appearance: She is not ill-appearing or diaphoretic.  Pulmonary:     Effort: Pulmonary effort is normal.  Neurological:     Mental Status: She is alert and oriented to person, place, and time.  Psychiatric:        Mood and Affect: Mood normal.     Ortho Exam Left knee good range of motion.  Slight opening with valgus varus stressing.  Positive anterior drawer.  Atrophy of her quad muscle compared to the right side.  No effusion abnormal warmth the left knee.  Left knee surgical incisions well-healed.  Right knee: Good range of motion of the right knee without significant pain.  Positive effusion.  Negative McMurray's.  No tenderness along medial lateral joint line.  No abnormal warmth erythema of the knee. Specialty Comments:  No specialty comments available.  Imaging: Right knee 3 views: No acute fracture.  Right knee is well located.  Moderately severe narrowing of the lateral compartment.  Moderate narrowing medial joint line.  Mild to moderate patellofemoral changes  PMFS History: Patient Active Problem List   Diagnosis Date Noted  . Paroxysmal atrial fibrillation ([place]) 06/16/2019  . Nonrheumatic aortic valve insufficiency 06/16/2019  . Nonrheumatic mitral valve regurgitation 06/16/2019  . Tachycardia 02/20/2019  . Abnormal EKG 02/20/2019  . Essential hypertension 02/20/2019  . Status post total left knee replacement 02/03/2019  . Unilateral primary osteoarthritis, left knee 11/30/2016  . Chronic pain of left knee 11/30/2016   Past Medical History:   Diagnosis Date  . Arthritis   . Bradycardia   . Bronchitis   . Depression   . Glaucoma   . Hypertension   . Seasonal allergies   . Wears glasses     Family History  Problem Relation Age of Onset  . Hypertension Mother   . Hyperlipidemia Mother   . Lymphoma Mother   . Lung cancer Father     Past Surgical History:  Procedure Laterality Date  . ABDOMINAL HYSTERECTOMY  1990  . BREAST BIOPSY Right    benign  . COLONOSCOPY    . meniscus tear knee     left knee  . NASAL SINUS SURGERY  1975  . ORIF ANKLE FRACTURE  2009   left  . TOTAL KNEE ARTHROPLASTY Left 02/03/2019   Procedure: LEFT TOTAL KNEE ARTHROPLASTY;  Surgeon: Mcarthur Rossetti, MD;  Location: [facility];  Service: Orthopedics;  Laterality: Left;  . TURBINATE REDUCTION Bilateral 06/04/2014   Procedure: BILATERAL TURBINATE REDUCTION;  Surgeon: Leta Baptist, MD;  Location: [facility];  Service: ENT;  Laterality: Bilateral;  . TYMPANOSTOMY TUBE PLACEMENT     left   Social History   Occupational History  . Not on file  Tobacco Use  . Smoking status: Former Smoker    Packs/day: 2.00    Years: 30.00    Pack years: 60.00    Types: Cigarettes    Quit date: 05/28/1998    Years since quitting: 21.1  . Smokeless tobacco: Never Used  Substance and Sexual Activity  . Alcohol use: Yes    Comment: daily-wine daily-1 glass  . Drug use: No  . Sexual activity: Not on file

## 2019-07-12 ENCOUNTER — Ambulatory Visit: Payer: Medicare HMO | Admitting: Physician Assistant

## 2019-07-12 ENCOUNTER — Telehealth: Payer: Self-pay

## 2019-07-12 ENCOUNTER — Encounter: Payer: Self-pay | Admitting: Physician Assistant

## 2019-07-12 ENCOUNTER — Other Ambulatory Visit: Payer: Self-pay

## 2019-07-12 ENCOUNTER — Telehealth: Payer: Self-pay | Admitting: Radiology

## 2019-07-12 DIAGNOSIS — M1711 Unilateral primary osteoarthritis, right knee: Secondary | ICD-10-CM | POA: Diagnosis not present

## 2019-07-12 NOTE — Progress Notes (Signed)
HPI: Michelle Johnston returns today follow-up status post right knee aspiration injection.  She states her knee is much better.  She states most days she wakes up though with the knee aching.  Is having no real mechanical symptoms in the knee.  Does have pain in the knee and achiness after being on it for long period of time.  Her left knee overall is doing well.  Review of systems: Please see HPI otherwise negative or noncontributory.  Physical exam: Right knee full extension full flexion.  Positive crepitus with passive range of motion.  No instability valgus varus stressing.  Slight effusion.  Impression: Right knee osteoarthritis  Plan: She will continue work on quad strengthening continue doing her water aerobics.  Recommend supplemental injection of her right knee and possible aspiration at that time.  Questions were encouraged and answered.  We will see her back once supplemental injections

## 2019-07-12 NOTE — Telephone Encounter (Signed)
Noted  

## 2019-07-12 NOTE — Telephone Encounter (Signed)
Submitted VOB for Monovisc, right knee. 

## 2019-07-12 NOTE — Telephone Encounter (Signed)
Right knee supplemental injection 

## 2019-07-14 ENCOUNTER — Telehealth: Payer: Self-pay

## 2019-07-14 NOTE — Telephone Encounter (Signed)
Called and left VM for patient to return call to schedule appointment for gel injection with Dr. Ninfa Linden or Artis Delay.  Approved for Monovisc, right knee. [place] Patient will be responsible for 20% OOP. Co-pay of $25.00 [place] Approval# [id number] Valid 07/14/2019- 01/14/2020

## 2019-07-19 NOTE — Progress Notes (Deleted)
Patient referred by Mayra Neer, MD for tachcyardia  Subjective:   Michelle Johnston, female    DOB: [birth year], 69 y.o.   MRN: [id number]   No chief complaint on file.    HPI  69 y.o. Caucasian female with hypertension, mod AI, mod MR, new diagnosis of Afib, h/o tobacco abuse, depression.  ***  ***Previous OV on 06/16/19: I saw the patient in 01/2019. She had reportedly had episodes of tachycardia post-op after her knee surgery, were not caught on EKG. Her EKG with me had showed sinus rhythm, LAFB, incomplete RBBB, first degree AV block. P wave had sharp uprise, suggesting possibly ectopic atrial origin, not seen again in 01/2019. I suspected she may have paroxysmal atrial tachycardia or Afib. Given that she was then asymptomatic, I obtained echocardiogram to rule out structural abnormality, and recommended her to contact us, should she have any recurrence.  She was seen by PCP Dr. Brigitte Pulse in April and was found to have Afib on EKG. she denies any symptoms of palpitations or chest pain.  She possibly has some exertional dyspnea with walking.  Patient drinks 2 glasses of wine every day.  She denies any melena or hematochezia.  MCV is noted to be elevated at 100. ***  Current Outpatient Medications on File Prior to Visit  Medication Sig Dispense Refill  . acetaminophen (TYLENOL) 500 MG tablet Take 1,000 mg by mouth every 6 (six) hours as needed. pain    . albuterol (VENTOLIN HFA) 108 (90 Base) MCG/ACT inhaler Inhale 1-2 puffs into the lungs every 6 (six) hours as needed for wheezing or shortness of breath.    Marland Kitchen amLODipine (NORVASC) 5 MG tablet Take 5 mg by mouth at bedtime.     Marland Kitchen b complex vitamins tablet Take 1 tablet by mouth daily.    . calcium carbonate (OS-CAL - DOSED IN MG OF ELEMENTAL CALCIUM) 1250 (500 Ca) MG tablet Take 1 tablet by mouth daily with breakfast.    . cetirizine (ZYRTEC) 10 MG tablet Take 10 mg by mouth at bedtime.    . cholecalciferol (VITAMIN D) 1000 UNITS  tablet Take 1,000 Units by mouth daily.    Marland Kitchen FLUoxetine (PROZAC) 20 MG capsule Take 40 mg by mouth daily.     . Fluticasone-Salmeterol (ADVAIR) 250-50 MCG/DOSE AEPB Inhale 1 puff into the lungs 2 (two) times daily as needed (asthma).    . guaiFENesin (MUCINEX) 600 MG 12 hr tablet Take 600 mg by mouth daily as needed.     . latanoprost (XALATAN) 0.005 % ophthalmic solution Place 1 drop into both eyes at bedtime.    . metoprolol tartrate (LOPRESSOR) 25 MG tablet Take 1 tablet (25 mg total) by mouth 2 (two) times daily. 180 tablet 3  . montelukast (SINGULAIR) 10 MG tablet Take 10 mg by mouth at bedtime.    . Multiple Vitamins-Minerals (MULTIVITAMIN WITH MINERALS) tablet Take 1 tablet by mouth daily.    . Omega-3 Fatty Acids (FISH OIL) 1000 MG CAPS Take 1,000 mg by mouth daily.    . Probiotic Product (PROBIOTIC DAILY PO) Take 1 capsule by mouth daily.    . rivaroxaban (XARELTO) 20 MG TABS tablet Take 20 mg by mouth daily with supper.    . vitamin C (ASCORBIC ACID) 500 MG tablet Take 500 mg by mouth daily.     No current facility-administered medications on file prior to visit.    Cardiovascular and other pertinent studies: ***  EKG ***/***/202***: ***  EKG 06/16/2019: Atrial fibrillation  90 bpm. Right bundle branch block. Left anterior fascicular block.   Echocardiogram 03/07/2019:  Normal LV systolic function with visual EF 55-60%. Mild concentric  hypertrophy of the left ventricle. Left ventricle cavity is normal in  size. Normal global wall motion. Doppler evidence of grade I (impaired)  diastolic dysfunction, normal LAP.  Trileaflet aortic valve. Moderate (Grade III) aortic regurgitation.  Mild mitral valve leaflet thickening. Moderate (Grade III) mitral  regurgitation.  Mild tricuspid regurgitation. Estimated pulmonary artery systolic pressure  is 24 mmHg.  EKG 06/09/2019: Atrial fibrillation 101 bpm. Incomplete RBBB. LAFB.  EKG 12/21/220: Sinus rhythm 75 bpm. Incomplete  RBBB. LAFB. FIrst degree AV block.  EKG 02/03/2019: Sinus rhythm 96 bpm.  Incomplete RBBB. LAFB.  First degree AV block.   *** Recent labs: 06/09/2019: Glucose 87, BUN/Cr 16/0.65. EGFR 90. Na/K 134/4.3. Rest of the CMP normal H/H 13/38. MCV 100. Platelets 311 TSH 1.9 normal   02/04/2019: Glucose 164, BUN/Cr 13/0.42. EGFR >60. Na/K 131/3.5. Rest of the CMP normal H/H 10.9/33.1. MCV 100.9. Platelets 222   11/21/2018: Glucose 83, BUN/Cr 11/0.51. EGFR normal. Na/K 133/4.3. Rest of the CMP normal Chol 244, TG 58, HDL 94, LDL 139   Review of Systems  Cardiovascular: Negative for chest pain, dyspnea on exertion, leg swelling, palpitations and syncope.  Musculoskeletal: Positive for joint pain.         There were no vitals filed for this visit.   There is no height or weight on file to calculate BMI. There were no vitals filed for this visit.   Objective:   Physical Exam  Constitutional: She appears well-developed and well-nourished.  Neck: No JVD present.  Cardiovascular: Normal rate, regular rhythm, normal heart sounds and intact distal pulses.  No murmur heard. Pulmonary/Chest: Effort normal and breath sounds normal. She has no wheezes. She has no rales.  Musculoskeletal:        General: No edema.  Nursing note and vitals reviewed.         Assessment & Recommendations:   69 y.o. Caucasian female with hypertension, mod AI, mod MR, new diagnosis of Afib, h/o tobacco abuse, depression.  ***  ***Previous OV: Paroxysmal Afib: Ventricular rate around 100 bpm.  Started metoprolol.  Recommend nuclear study. CHA2DS2VASc score 3, annual stroke risk 3.6% Recommend Xarelto 20 mg daily. Patient is going to check with her insurance regarding coverage.  Provided patients with samples for 6 weeks. Cautioned regarding avoiding use of NSAIDs, and reducing alcohol intake. I will see her back in 4 weeks.  If she stays in atrial fibrillation, will attempt cardioversion.  If  she is not able to sustain sinus rhythm, in absence of profound symptoms, do not recommend antiarrhythmic therapy.  Will consider ischemia evaluation after conversion to sinus rhythm.  Mod AI, mod MR: Clinically stable.  Has repeat echocardiogram scheduled for July 2020.  Hypertension:  Added metoprolol as above.  Nigel Mormon, MD [facility] [place] Pager: [phone] Office: [phone]

## 2019-07-20 ENCOUNTER — Ambulatory Visit: Payer: Medicare HMO | Admitting: Cardiology

## 2019-07-24 ENCOUNTER — Other Ambulatory Visit: Payer: Self-pay

## 2019-07-24 ENCOUNTER — Encounter: Payer: Self-pay | Admitting: Orthopaedic Surgery

## 2019-07-24 ENCOUNTER — Ambulatory Visit: Payer: Medicare HMO | Admitting: Orthopaedic Surgery

## 2019-07-24 DIAGNOSIS — M1711 Unilateral primary osteoarthritis, right knee: Secondary | ICD-10-CM

## 2019-07-24 MED ORDER — HYALURONAN 88 MG/4ML IX SOSY
88.0000 mg | PREFILLED_SYRINGE | INTRA_ARTICULAR | Status: AC | PRN
  Administered 2019-07-24: 88 mg via INTRA_ARTICULAR

## 2019-07-24 NOTE — Progress Notes (Signed)
   Procedure Note  Patient: Michelle Johnston             Date of Birth: May 30, 1950           MRN: [id number]             Visit Date: 07/24/2019  Procedures: Visit Diagnoses:  1. Primary osteoarthritis of right knee     Large Joint Inj: R knee on 07/24/2019 2:25 PM Indications: diagnostic evaluation and pain Details: 22 G 1.5 in needle, superolateral approach  Arthrogram: No  Medications: 88 mg Hyaluronan 88 MG/4ML Outcome: tolerated well, no immediate complications Procedure, treatment alternatives, risks and benefits explained, specific risks discussed. Consent was given by the patient. Immediately prior to procedure a time out was called to verify the correct patient, procedure, equipment, support staff and site/side marked as required. Patient was prepped and draped in the usual sterile fashion.    The patient has known osteoarthritis of her right knee.  She does have a history of a successful left total knee arthroplasty.  She is hoping not to go through that again for her right knee.  We have tried conservative treatment which she has failed including steroid injections.  This is the next step to try which is hyaluronic acid with Monovisc.  She understands fully why we are trying this.  On examination she has a moderate right knee joint effusion.  I was able to aspirate about 40 to 50 cc of yellow fluid from the knee consistent with her osteoarthritis.  I then placed Monovisc in the knee without difficulty.  We had a good and thorough discussion about her knee.  She understands if this fails the other treatment recommendations will be do-nothing versus knee replacement.

## 2019-07-25 NOTE — Progress Notes (Signed)
Patient referred by Mayra Neer, MD for tachcyardia  Subjective:   Michelle Johnston, female    DOB: 16-Nov-1950, 69 y.o.   MRN: [id number]   Chief Complaint  Patient presents with  . Follow-up    4 week  . Atrial Fibrillation   HPI   Michelle Johnston is a 69 y.o. Caucasian female patient with hypertension, mod AI, mod MR, new diagnosis of Afib, h/o tobacco abuse, depression. At last visit in 06/2019, I started the patient on metoprolol and recommended nuclear study. I recommended Xarelto for paroxysmal Afib to 20 mg daily, and provided patient with samples for 6 weeks. I recommended cardioversion if the patient stays in atrial fibrillation.  She continues to have marked fatigue and dyspnea even with minimal activity.  She is tolerating anticoagulation well and requests prescription for the same.  Denies chest pain, PND or orthopnea.  No leg edema.  No bleeding diathesis.  Patient drinks 2 glasses of wine every day.  She denies any melena or hematochezia.  MCV is noted to be elevated at 100.   Current Outpatient Medications on File Prior to Visit  Medication Sig Dispense Refill  . acetaminophen (TYLENOL) 500 MG tablet Take 1,000 mg by mouth every 6 (six) hours as needed. pain    . albuterol (VENTOLIN HFA) 108 (90 Base) MCG/ACT inhaler Inhale 1-2 puffs into the lungs every 6 (six) hours as needed for wheezing or shortness of breath.    Marland Kitchen amLODipine (NORVASC) 5 MG tablet Take 5 mg by mouth at bedtime.     Marland Kitchen b complex vitamins tablet Take 1 tablet by mouth daily.    . calcium carbonate (OS-CAL - DOSED IN MG OF ELEMENTAL CALCIUM) 1250 (500 Ca) MG tablet Take 1 tablet by mouth daily with breakfast.    . cetirizine (ZYRTEC) 10 MG tablet Take 10 mg by mouth at bedtime.    . cholecalciferol (VITAMIN D) 1000 UNITS tablet Take 1,000 Units by mouth daily.    Marland Kitchen FLUoxetine (PROZAC) 20 MG capsule Take 40 mg by mouth daily.     . Fluticasone-Salmeterol (ADVAIR) 250-50 MCG/DOSE AEPB Inhale 1 puff into  the lungs 2 (two) times daily as needed (asthma).    . guaiFENesin (MUCINEX) 600 MG 12 hr tablet Take 600 mg by mouth daily as needed.     . latanoprost (XALATAN) 0.005 % ophthalmic solution Place 1 drop into both eyes at bedtime.    . metoprolol tartrate (LOPRESSOR) 25 MG tablet Take 1 tablet (25 mg total) by mouth 2 (two) times daily. (Patient taking differently: Take 12.5 mg by mouth 2 (two) times daily. ) 180 tablet 3  . montelukast (SINGULAIR) 10 MG tablet Take 10 mg by mouth at bedtime.    . Multiple Vitamins-Minerals (MULTIVITAMIN WITH MINERALS) tablet Take 1 tablet by mouth daily.    . Omega-3 Fatty Acids (FISH OIL) 1000 MG CAPS Take 1,000 mg by mouth daily.    . Probiotic Product (PROBIOTIC DAILY PO) Take 1 capsule by mouth daily.    . vitamin C (ASCORBIC ACID) 500 MG tablet Take 500 mg by mouth daily.     No current facility-administered medications on file prior to visit.    Cardiovascular and other pertinent studies:  Echocardiogram 03/07/2019:  Normal LV systolic function with visual EF 55-60%. Mild concentric hypertrophy of the left ventricle. Left ventricle cavity is normal in  size. Normal global wall motion. Doppler evidence of grade I (impaired) diastolic dysfunction, normal LAP.  Trileaflet aortic  valve. Moderate (Grade III) aortic regurgitation.  Mild mitral valve leaflet thickening. Moderate (Grade III) mitral regurgitation.  Mild tricuspid regurgitation. Estimated pulmonary artery systolic pressure is 24 mmHg. Left atrial size normal at 3.5, left atrial index also normal.  EKG:   EKG 12/21/220: Sinus rhythm 75 bpm. Incomplete RBBB. LAFB. FIrst degree AV block.  No significant change from 02/03/2019.  EKG 07/26/2019: Atrial fibrillation with controlled ventricular response at the rate of 91 bpm, left axis deviation, left anterior fascicular block.  Incomplete right bundle branch block.  Poor R wave progression, cannot exclude anteroseptal infarct old.  No evidence of  ischemia.  No significant change from 06/16/2019.  Recent labs: 06/09/2019: Glucose 87, BUN/Cr 16/0.65. EGFR 90. Na/K 134/4.3. Rest of the CMP normal H/H 13/38. MCV 100. Platelets 311 TSH 1.9 normal  02/04/2019: Glucose 164, BUN/Cr 13/0.42. EGFR >60. Na/K 131/3.5. Rest of the CMP normal H/H 10.9/33.1. MCV 100.9. Platelets 222   11/21/2018: Glucose 83, BUN/Cr 11/0.51. EGFR normal. Na/K 133/4.3. Rest of the CMP normal Chol 244, TG 58, HDL 94, LDL 139  Review of Systems  Constitution: Positive for malaise/fatigue.  Cardiovascular: Positive for dyspnea on exertion. Negative for chest pain, leg swelling, palpitations and syncope.  Musculoskeletal: Positive for joint pain.   Vitals:   07/26/19 0839  BP: 138/85  Pulse: 88  SpO2: 98%   Body mass index is 25.44 kg/m. Filed Weights   07/26/19 0839  Weight: 152 lb 14.4 oz (69.4 kg)   Objective:   Physical Exam  Constitutional: She appears well-developed and well-nourished.  Neck: No JVD present.  Cardiovascular: Normal rate and intact distal pulses. An irregularly irregular rhythm present.  Murmur heard. High-pitched blowing decrescendo early diastolic murmur is present with a grade of 2/6 at the upper right sternal border. S1 variable and S2 normal  Pulmonary/Chest: Effort normal and breath sounds normal. She has no wheezes. She has no rales.  Musculoskeletal:        General: No edema.  Nursing note and vitals reviewed.  Assessment & Recommendations:     ICD-10-CM   1. Paroxysmal atrial fibrillation ([place]). CHA2DS2-VASc Score is 3.  Yearly risk of stroke: 3.2% (A, F, HTN).  I48.0 EKG 12-Lead    PCV MYOCARDIAL PERFUSION WO LEXISCAN    rivaroxaban (XARELTO) 20 MG TABS tablet  2. Essential hypertension  I10   3. Dyspnea on exertion  R06.00 PCV MYOCARDIAL PERFUSION WO LEXISCAN  4. Malaise and fatigue  R53.81    R53.83     Michelle Johnston is a 69 y.o. Caucasian female with hypertension, mod AI, mod MR, new diagnosis of Afib, h/o  tobacco abuse, depression.  I reviewed the EKG with the patient, she is still in persistent atrial fibrillation.  She has noticed marked dyspnea and also marked fatigue and malaise since last few months and felt that it was related to either her age or the medications.  Suspect atrial fibrillation is symptomatic.  Since being on metoprolol, palpitation symptoms are improved.  Blood pressure is well controlled today, aortic regurgitation appears stable by physical exam, no clinical evidence of heart failure.  I will set her up for exercise nuclear stress test to exclude ischemic etiology and if negative, we will schedule her for direct-current cardioversion.  She will continue with anticoagulation with Xarelto 20 mg daily.   Schedule for Direct current cardioversion. I have discussed regarding risks benefits rate control vs rhythm control with the patient. Patient understands cardiac arrest and need for CPR, aspiration pneumonia,  but not limited to these. Patient is willing. Office visit following the work-up/investigations. If nuclear stress test is negative, she will need lab work prior to cardioversion including CBC and BMP.  Although she has moderate aortic regurgitation, left atrial size and left atrial index is within normal limits and she probably has a high chance of maintaining sinus rhythm and if not, could consider antiarrhythmic therapy.    Adrian Prows, MD, [facility] 07/29/2019, 9:31 AM [facility]. [place] Office: [phone]

## 2019-07-26 ENCOUNTER — Ambulatory Visit: Payer: Medicare HMO | Admitting: Cardiology

## 2019-07-26 ENCOUNTER — Other Ambulatory Visit: Payer: Self-pay

## 2019-07-26 ENCOUNTER — Encounter: Payer: Self-pay | Admitting: Cardiology

## 2019-07-26 VITALS — BP 138/85 | HR 88 | Ht 65.0 in | Wt 152.9 lb

## 2019-07-26 DIAGNOSIS — R5383 Other fatigue: Secondary | ICD-10-CM

## 2019-07-26 DIAGNOSIS — R0609 Other forms of dyspnea: Secondary | ICD-10-CM

## 2019-07-26 DIAGNOSIS — I10 Essential (primary) hypertension: Secondary | ICD-10-CM

## 2019-07-26 DIAGNOSIS — R5381 Other malaise: Secondary | ICD-10-CM | POA: Diagnosis not present

## 2019-07-26 DIAGNOSIS — I480 Paroxysmal atrial fibrillation: Secondary | ICD-10-CM

## 2019-07-26 MED ORDER — RIVAROXABAN 20 MG PO TABS: 20.0000 mg | ORAL_TABLET | Freq: Every day | ORAL | 1 refills | Status: DC

## 2019-08-02 ENCOUNTER — Other Ambulatory Visit: Payer: Self-pay

## 2019-08-02 DIAGNOSIS — I10 Essential (primary) hypertension: Secondary | ICD-10-CM

## 2019-08-03 ENCOUNTER — Other Ambulatory Visit: Payer: Self-pay

## 2019-08-03 DIAGNOSIS — I10 Essential (primary) hypertension: Secondary | ICD-10-CM

## 2019-08-07 ENCOUNTER — Other Ambulatory Visit: Payer: Self-pay

## 2019-08-07 ENCOUNTER — Ambulatory Visit: Payer: Medicare HMO

## 2019-08-07 DIAGNOSIS — I480 Paroxysmal atrial fibrillation: Secondary | ICD-10-CM | POA: Diagnosis not present

## 2019-08-07 DIAGNOSIS — R0609 Other forms of dyspnea: Secondary | ICD-10-CM

## 2019-08-08 NOTE — Progress Notes (Signed)
Your patient 

## 2019-08-09 ENCOUNTER — Other Ambulatory Visit: Payer: Self-pay

## 2019-08-10 DIAGNOSIS — I10 Essential (primary) hypertension: Secondary | ICD-10-CM | POA: Diagnosis not present

## 2019-08-11 ENCOUNTER — Other Ambulatory Visit (HOSPITAL_COMMUNITY)
Admission: RE | Admit: 2019-08-11 | Discharge: 2019-08-11 | Disposition: A | Discharge status: Home / Self Care | Payer: Medicare HMO | Source: Ambulatory Visit | Attending: Cardiology | Admitting: Cardiology

## 2019-08-11 DIAGNOSIS — Z01812 Encounter for preprocedural laboratory examination: Secondary | ICD-10-CM | POA: Insufficient documentation

## 2019-08-11 DIAGNOSIS — Z20822 Contact with and (suspected) exposure to covid-19: Secondary | ICD-10-CM | POA: Diagnosis not present

## 2019-08-11 LAB — CBC WITH DIFFERENTIAL
Basophils Absolute: 0 10*3/uL (ref 0.0–0.2)
Basos: 0 %
EOS (ABSOLUTE): 0.2 10*3/uL (ref 0.0–0.4)
Eos: 4 %
Hematocrit: 38.4 % (ref 34.0–46.6)
Hemoglobin: 13.1 g/dL (ref 11.1–15.9)
Immature Grans (Abs): 0 10*3/uL (ref 0.0–0.1)
Immature Granulocytes: 0 %
Lymphocytes Absolute: 1.2 10*3/uL (ref 0.7–3.1)
Lymphs: 23 %
MCH: 34.6 pg — ABNORMAL HIGH (ref 26.6–33.0)
MCHC: 34.1 g/dL (ref 31.5–35.7)
MCV: 101 fL — ABNORMAL HIGH (ref 79–97)
Monocytes Absolute: 0.4 10*3/uL (ref 0.1–0.9)
Monocytes: 8 %
Neutrophils Absolute: 3.3 10*3/uL (ref 1.4–7.0)
Neutrophils: 65 %
RBC: 3.79 x10E6/uL (ref 3.77–5.28)
RDW: 12.1 % (ref 11.7–15.4)
WBC: 5.2 10*3/uL (ref 3.4–10.8)

## 2019-08-11 LAB — BMP8+EGFR
BUN/Creatinine Ratio: 31 — ABNORMAL HIGH (ref 12–28)
BUN: 22 mg/dL (ref 8–27)
CO2: 23 mmol/L (ref 20–29)
Calcium: 9.4 mg/dL (ref 8.7–10.3)
Chloride: 99 mmol/L (ref 96–106)
Creatinine, Ser: 0.7 mg/dL (ref 0.57–1.00)
GFR calc Af Amer: 101 mL/min/{1.73_m2} (ref >59)
GFR calc non Af Amer: 87 mL/min/{1.73_m2} (ref >59)
Glucose: 103 mg/dL — ABNORMAL HIGH (ref 65–99)
Potassium: 4.5 mmol/L (ref 3.5–5.2)
Sodium: 135 mmol/L (ref 134–144)

## 2019-08-11 LAB — SARS CORONAVIRUS 2 (TAT 6-24 HRS): SARS Coronavirus 2: NEGATIVE

## 2019-08-15 ENCOUNTER — Ambulatory Visit (HOSPITAL_COMMUNITY): Payer: Medicare HMO | Admitting: Certified Registered Nurse Anesthetist

## 2019-08-15 ENCOUNTER — Other Ambulatory Visit: Payer: Self-pay

## 2019-08-15 ENCOUNTER — Encounter (HOSPITAL_COMMUNITY)
Admission: RE | Disposition: A | Discharge status: Home / Self Care | Payer: Self-pay | Source: Home / Self Care | Attending: Cardiology

## 2019-08-15 ENCOUNTER — Ambulatory Visit (HOSPITAL_COMMUNITY)
Admission: RE | Admit: 2019-08-15 | Discharge: 2019-08-15 | Disposition: A | Discharge status: Home / Self Care | Payer: Medicare HMO | Attending: Cardiology | Admitting: Cardiology

## 2019-08-15 DIAGNOSIS — I351 Nonrheumatic aortic (valve) insufficiency: Secondary | ICD-10-CM | POA: Diagnosis not present

## 2019-08-15 DIAGNOSIS — Z7951 Long term (current) use of inhaled steroids: Secondary | ICD-10-CM | POA: Insufficient documentation

## 2019-08-15 DIAGNOSIS — Z7901 Long term (current) use of anticoagulants: Secondary | ICD-10-CM | POA: Diagnosis not present

## 2019-08-15 DIAGNOSIS — F329 Major depressive disorder, single episode, unspecified: Secondary | ICD-10-CM | POA: Diagnosis not present

## 2019-08-15 DIAGNOSIS — I480 Paroxysmal atrial fibrillation: Secondary | ICD-10-CM | POA: Diagnosis not present

## 2019-08-15 DIAGNOSIS — R5381 Other malaise: Secondary | ICD-10-CM | POA: Insufficient documentation

## 2019-08-15 DIAGNOSIS — R5383 Other fatigue: Secondary | ICD-10-CM | POA: Diagnosis not present

## 2019-08-15 DIAGNOSIS — Z79899 Other long term (current) drug therapy: Secondary | ICD-10-CM | POA: Diagnosis not present

## 2019-08-15 DIAGNOSIS — I10 Essential (primary) hypertension: Secondary | ICD-10-CM | POA: Diagnosis not present

## 2019-08-15 DIAGNOSIS — R0609 Other forms of dyspnea: Secondary | ICD-10-CM | POA: Insufficient documentation

## 2019-08-15 HISTORY — PX: CARDIOVERSION: SHX1299

## 2019-08-15 SURGERY — CARDIOVERSION
Anesthesia: Monitor Anesthesia Care

## 2019-08-15 MED ORDER — LIDOCAINE 2% (20 MG/ML) 5 ML SYRINGE
INTRAMUSCULAR | Status: DC | PRN
  Administered 2019-08-15: 100 mg via INTRAVENOUS

## 2019-08-15 MED ORDER — PROPOFOL 10 MG/ML IV BOLUS
INTRAVENOUS | Status: DC | PRN
  Administered 2019-08-15: 70 mg via INTRAVENOUS
  Administered 2019-08-15: 30 mg via INTRAVENOUS

## 2019-08-15 MED ORDER — SODIUM CHLORIDE 0.9 % IV SOLN: INTRAVENOUS | Status: DC | PRN

## 2019-08-15 NOTE — Anesthesia Postprocedure Evaluation (Signed)
Anesthesia Post Note  Patient: Michelle Johnston  Procedure(s) Performed: CARDIOVERSION (N/A )     Patient location during evaluation: PACU Anesthesia Type: General Level of consciousness: awake and alert Pain management: pain level controlled Vital Signs Assessment: post-procedure vital signs reviewed and stable Respiratory status: spontaneous breathing, nonlabored ventilation, respiratory function stable and patient connected to nasal cannula oxygen Cardiovascular status: blood pressure returned to baseline and stable Postop Assessment: no apparent nausea or vomiting Anesthetic complications: no   No complications documented.  Last Vitals:  Vitals:   08/15/19 1153 08/15/19 1205  BP: 136/62 (!) 140/56  Pulse: (!) 49 (!) 45  Resp: 14 18  Temp:    SpO2: 96% 98%    Last Pain:  Vitals:   08/15/19 1205  TempSrc:   PainSc: 0-No pain                 Sung [place] DAVID

## 2019-08-15 NOTE — H&P (Signed)
OV 07/29/2019     Patient referred by Mayra Neer, MD for tachcyardia  Subjective:   Michelle Johnston, female    DOB: 1951-02-04, 69 y.o.   MRN: [id number]       Chief Complaint  Patient presents with  . Follow-up    4 week  . Atrial Fibrillation   HPI   Michelle Johnston is a 69 y.o. Caucasian female patient with hypertension, mod AI, mod MR, new diagnosis of Afib, h/o tobacco abuse, depression. At last visit in 06/2019, I started the patient on metoprolol and recommended nuclear study. I recommended Xarelto for paroxysmal Afib to 20 mg daily, and provided patient with samples for 6 weeks. I recommended cardioversion if the patient stays in atrial fibrillation.  She continues to have marked fatigue and dyspnea even with minimal activity.  She is tolerating anticoagulation well and requests prescription for the same.  Denies chest pain, PND or orthopnea.  No leg edema.  No bleeding diathesis.  Patient drinks 2 glasses of wine every day.  She denies any melena or hematochezia.  MCV is noted to be elevated at 100.         Current Outpatient Medications on File Prior to Visit  Medication Sig Dispense Refill  . acetaminophen (TYLENOL) 500 MG tablet Take 1,000 mg by mouth every 6 (six) hours as needed. pain    . albuterol (VENTOLIN HFA) 108 (90 Base) MCG/ACT inhaler Inhale 1-2 puffs into the lungs every 6 (six) hours as needed for wheezing or shortness of breath.    Marland Kitchen amLODipine (NORVASC) 5 MG tablet Take 5 mg by mouth at bedtime.     Marland Kitchen b complex vitamins tablet Take 1 tablet by mouth daily.    . calcium carbonate (OS-CAL - DOSED IN MG OF ELEMENTAL CALCIUM) 1250 (500 Ca) MG tablet Take 1 tablet by mouth daily with breakfast.    . cetirizine (ZYRTEC) 10 MG tablet Take 10 mg by mouth at bedtime.    . cholecalciferol (VITAMIN D) 1000 UNITS tablet Take 1,000 Units by mouth daily.    Marland Kitchen FLUoxetine (PROZAC) 20 MG capsule Take 40 mg by mouth daily.     .  Fluticasone-Salmeterol (ADVAIR) 250-50 MCG/DOSE AEPB Inhale 1 puff into the lungs 2 (two) times daily as needed (asthma).    . guaiFENesin (MUCINEX) 600 MG 12 hr tablet Take 600 mg by mouth daily as needed.     . latanoprost (XALATAN) 0.005 % ophthalmic solution Place 1 drop into both eyes at bedtime.    . metoprolol tartrate (LOPRESSOR) 25 MG tablet Take 1 tablet (25 mg total) by mouth 2 (two) times daily. (Patient taking differently: Take 12.5 mg by mouth 2 (two) times daily. ) 180 tablet 3  . montelukast (SINGULAIR) 10 MG tablet Take 10 mg by mouth at bedtime.    . Multiple Vitamins-Minerals (MULTIVITAMIN WITH MINERALS) tablet Take 1 tablet by mouth daily.    . Omega-3 Fatty Acids (FISH OIL) 1000 MG CAPS Take 1,000 mg by mouth daily.    . Probiotic Product (PROBIOTIC DAILY PO) Take 1 capsule by mouth daily.    . vitamin C (ASCORBIC ACID) 500 MG tablet Take 500 mg by mouth daily.     No current facility-administered medications on file prior to visit.    Cardiovascular and other pertinent studies:  Echocardiogram 03/07/2019:  Normal LV systolic function with visual EF 55-60%. Mild concentric hypertrophy of the left ventricle. Left ventricle cavity is normal in  size. Normal global wall  motion. Doppler evidence of grade I (impaired) diastolic dysfunction, normal LAP.  Trileaflet aortic valve. Moderate (Grade III) aortic regurgitation.  Mild mitral valve leaflet thickening. Moderate (Grade III) mitral regurgitation.  Mild tricuspid regurgitation. Estimated pulmonary artery systolic pressure is 24 mmHg. Left atrial size normal at 3.5, left atrial index also normal.  EKG:   EKG 12/21/220: Sinus rhythm 75 bpm. Incomplete RBBB. LAFB. FIrst degree AV block.  No significant change from 02/03/2019.  EKG 07/26/2019: Atrial fibrillation with controlled ventricular response at the rate of 91 bpm, left axis deviation, left anterior fascicular block.  Incomplete right bundle  branch block.  Poor R wave progression, cannot exclude anteroseptal infarct old.  No evidence of ischemia.  No significant change from 06/16/2019.  Recent labs: 06/09/2019: Glucose 87, BUN/Cr 16/0.65. EGFR 90. Na/K 134/4.3. Rest of the CMP normal H/H 13/38. MCV 100. Platelets 311 TSH 1.9 normal  02/04/2019: Glucose 164, BUN/Cr 13/0.42. EGFR >60. Na/K 131/3.5. Rest of the CMP normal H/H 10.9/33.1. MCV 100.9. Platelets 222   11/21/2018: Glucose 83, BUN/Cr 11/0.51. EGFR normal. Na/K 133/4.3. Rest of the CMP normal Chol 244, TG 58, HDL 94, LDL 139  Review of Systems  Constitution: Positive for malaise/fatigue.  Cardiovascular: Positive for dyspnea on exertion. Negative for chest pain, leg swelling, palpitations and syncope.  Musculoskeletal: Positive for joint pain.      Vitals:   07/26/19 0839  BP: 138/85  Pulse: 88  SpO2: 98%   Body mass index is 25.44 kg/m.    Filed Weights   07/26/19 0839  Weight: 152 lb 14.4 oz (69.4 kg)   Objective:   Objective [] Expand by Default  Physical Exam  Constitutional: She appears well-developed and well-nourished.  Neck: No JVD present.  Cardiovascular: Normal rate and intact distal pulses. An irregularly irregular rhythm present.  Murmur heard. High-pitched blowing decrescendo early diastolic murmur is present with a grade of 2/6 at the upper right sternal border. S1 variable and S2 normal  Pulmonary/Chest: Effort normal and breath sounds normal. She has no wheezes. She has no rales.  Musculoskeletal:        General: No edema.  Nursing note and vitals reviewed.  Assessment & Recommendations:     ICD-10-CM   1. Paroxysmal atrial fibrillation ([place]). CHA2DS2-VASc Score is 3.  Yearly risk of stroke: 3.2% (A, F, HTN).  I48.0 EKG 12-Lead    PCV MYOCARDIAL PERFUSION WO LEXISCAN    rivaroxaban (XARELTO) 20 MG TABS tablet  2. Essential hypertension  I10   3. Dyspnea on exertion  R06.00 PCV MYOCARDIAL PERFUSION WO  LEXISCAN  4. Malaise and fatigue  R53.81    R53.83     Michelle Johnston is a 69 y.o. Caucasian female with hypertension, mod AI, mod MR, new diagnosis of Afib, h/o tobacco abuse, depression.  I reviewed the EKG with the patient, she is still in persistent atrial fibrillation.  She has noticed marked dyspnea and also marked fatigue and malaise since last few months and felt that it was related to either her age or the medications.  Suspect atrial fibrillation is symptomatic.  Since being on metoprolol, palpitation symptoms are improved.  Blood pressure is well controlled today, aortic regurgitation appears stable by physical exam, no clinical evidence of heart failure.  I will set her up for exercise nuclear stress test to exclude ischemic etiology and if negative, we will schedule her for direct-current cardioversion.  She will continue with anticoagulation with Xarelto 20 mg daily.   Schedule for Direct current  cardioversion. I have discussed regarding risks benefits rate control vs rhythm control with the patient. Patient understands cardiac arrest and need for CPR, aspiration pneumonia, but not limited to these. Patient is willing. Office visit following the work-up/investigations. If nuclear stress test is negative, she will need lab work prior to cardioversion including CBC and BMP.  Although she has moderate aortic regurgitation, left atrial size and left atrial index is within normal limits and she probably has a high chance of maintaining sinus rhythm and if not, could consider antiarrhythmic therapy.    Adrian Prows, MD, [facility] 07/29/2019, 9:31 AM [facility]. [place] Office: [phone]

## 2019-08-15 NOTE — CV Procedure (Signed)
Direct current cardioversion:  Indication symptomatic: Symptomatic atrial fibrillation  Procedure: Under deep sedation administered and monitored by anesthesiology, synchronized direct current cardioversion performed. Patient was delivered with 120 Joules of electricity X 1 with success to NSR. Patient tolerated the procedure well. No immediate complication noted.   Taji Barretto J Anyra Kaufman, MD [facility]. [place] Pager: [phone] Office: [phone] If no answer Cell [phone]    

## 2019-08-15 NOTE — Transfer of Care (Signed)
Immediate Anesthesia Transfer of Care Note  Patient: Michelle Johnston  Procedure(s) Performed: CARDIOVERSION (N/A )  Patient Location: Endoscopy Unit  Anesthesia Type:General  Level of Consciousness: drowsy  Airway & Oxygen Therapy: Patient Spontanous Breathing  Post-op Assessment: Report given to RN and Post -op Vital signs reviewed and stable  Post vital signs: Reviewed and stable  Last Vitals:  Vitals Value Taken Time  BP 112/45   Temp    Pulse 51   Resp 16   SpO2 96%     Last Pain:  Vitals:   08/15/19 1053  TempSrc: Oral  PainSc: 0-No pain         Complications: No complications documented.

## 2019-08-15 NOTE — Anesthesia Preprocedure Evaluation (Signed)
Anesthesia Evaluation  Patient identified by MRN, date of birth, ID band Patient awake    Reviewed: Allergy & Precautions, NPO status , Patient's Chart, lab work & pertinent test results  Airway Mallampati: I  TM Distance: >3 FB Neck ROM: Full    Dental   Pulmonary former smoker,    Pulmonary exam normal        Cardiovascular hypertension, Pt. on medications Normal cardiovascular exam     Neuro/Psych Depression    GI/Hepatic   Endo/Other    Renal/GU      Musculoskeletal   Abdominal   Peds  Hematology   Anesthesia Other Findings   Reproductive/Obstetrics                             Anesthesia Physical Anesthesia Plan  ASA: III  Anesthesia Plan: General   Post-op Pain Management:    Induction: Intravenous  PONV Risk Score and Plan: 3 and Treatment may vary due to age or medical condition  Airway Management Planned: Mask  Additional Equipment:   Intra-op Plan:   Post-operative Plan: Extubation in OR  Informed Consent: I have reviewed the patients History and Physical, chart, labs and discussed the procedure including the risks, benefits and alternatives for the proposed anesthesia with the patient or authorized representative who has indicated his/her understanding and acceptance.       Plan Discussed with: CRNA and Surgeon  Anesthesia Plan Comments:         Anesthesia Quick Evaluation

## 2019-08-15 NOTE — Interval H&P Note (Signed)
History and Physical Interval Note:  08/15/2019 11:00 AM  Michelle Johnston  has presented today for surgery, with the diagnosis of [place].  The various methods of treatment have been discussed with the patient and family. After consideration of risks, benefits and other options for treatment, the patient has consented to  Procedure(s): CARDIOVERSION (N/A) as a surgical intervention.  The patient's history has been reviewed, patient examined, no change in status, stable for surgery.  I have reviewed the patient's chart and labs.  Questions were answered to the patient's satisfaction.     [place]

## 2019-08-15 NOTE — Discharge Instructions (Signed)
Electrical Cardioversion Electrical cardioversion is the delivery of a jolt of electricity to restore a normal rhythm to the heart. A rhythm that is too fast or is not regular keeps the heart from pumping well. In this procedure, sticky patches or metal paddles are placed on the chest to deliver electricity to the heart from a device. This procedure may be done in an emergency if:  There is low or no blood pressure as a result of the heart rhythm.  Normal rhythm must be restored as fast as possible to protect the brain and heart from further damage.  It may save a life. This may also be a scheduled procedure for irregular or fast heart rhythms that are not immediately life-threatening. Tell a health care provider about:  Any allergies you have.  All medicines you are taking, including vitamins, herbs, eye drops, creams, and over-the-counter medicines.  Any problems you or family members have had with anesthetic medicines.  Any blood disorders you have.  Any surgeries you have had.  Any medical conditions you have.  Whether you are pregnant or may be pregnant. What are the risks? Generally, this is a safe procedure. However, problems may occur, including:  Allergic reactions to medicines.  A blood clot that breaks free and travels to other parts of your body.  The possible return of an abnormal heart rhythm within hours or days after the procedure.  Your heart stopping (cardiac arrest). This is rare. What happens before the procedure? Medicines  Your health care provider may have you start taking: ? Blood-thinning medicines (anticoagulants) so your blood does not clot as easily. ? Medicines to help stabilize your heart rate and rhythm.  Ask your health care provider about: ? Changing or stopping your regular medicines. This is especially important if you are taking diabetes medicines or blood thinners. ? Taking medicines such as aspirin and ibuprofen. These medicines can  thin your blood. Do not take these medicines unless your health care provider tells you to take them. ? Taking over-the-counter medicines, vitamins, herbs, and supplements. General instructions  Follow instructions from your health care provider about eating or drinking restrictions.  Plan to have someone take you home from the hospital or clinic.  If you will be going home right after the procedure, plan to have someone with you for 24 hours.  Ask your health care provider what steps will be taken to help prevent infection. These may include washing your skin with a germ-killing soap. What happens during the procedure?   An IV will be inserted into one of your veins.  Sticky patches (electrodes) or metal paddles may be placed on your chest.  You will be given a medicine to help you relax (sedative).  An electrical shock will be delivered. The procedure may vary among health care providers and hospitals. What can I expect after the procedure?  Your blood pressure, heart rate, breathing rate, and blood oxygen level will be monitored until you leave the hospital or clinic.  Your heart rhythm will be watched to make sure it does not change.  You may have some redness on the skin where the shocks were given. Follow these instructions at home:  Do not drive for 24 hours if you were given a sedative during your procedure.  Take over-the-counter and prescription medicines only as told by your health care provider.  Ask your health care provider how to check your pulse. Check it often.  Rest for 48 hours after the procedure or   as told by your health care provider.  Avoid or limit your caffeine use as told by your health care provider.  Keep all follow-up visits as told by your health care provider. This is important. Contact a health care provider if:  You feel like your heart is beating too quickly or your pulse is not regular.  You have a serious muscle cramp that does not go  away. Get help right away if:  You have discomfort in your chest.  You are dizzy or you feel faint.  You have trouble breathing or you are short of breath.  Your speech is slurred.  You have trouble moving an arm or leg on one side of your body.  Your fingers or toes turn cold or blue. Summary  Electrical cardioversion is the delivery of a jolt of electricity to restore a normal rhythm to the heart.  This procedure may be done right away in an emergency or may be a scheduled procedure if the condition is not an emergency.  Generally, this is a safe procedure.  After the procedure, check your pulse often as told by your health care provider. This information is not intended to replace advice given to you by your health care provider. Make sure you discuss any questions you have with your health care provider. Document Revised: 09/19/2018 Document Reviewed: 09/19/2018 Elsevier Patient Education  2020 [employer].  

## 2019-08-16 ENCOUNTER — Encounter (HOSPITAL_COMMUNITY): Payer: Self-pay | Admitting: Cardiology

## 2019-08-24 ENCOUNTER — Ambulatory Visit: Payer: Medicare HMO | Admitting: Cardiology

## 2019-08-24 ENCOUNTER — Encounter: Payer: Self-pay | Admitting: Cardiology

## 2019-08-24 ENCOUNTER — Other Ambulatory Visit: Payer: Self-pay

## 2019-08-24 VITALS — BP 125/89 | HR 77 | Resp 17 | Ht 65.0 in | Wt 154.0 lb

## 2019-08-24 DIAGNOSIS — I351 Nonrheumatic aortic (valve) insufficiency: Secondary | ICD-10-CM | POA: Diagnosis not present

## 2019-08-24 DIAGNOSIS — I10 Essential (primary) hypertension: Secondary | ICD-10-CM

## 2019-08-24 DIAGNOSIS — I480 Paroxysmal atrial fibrillation: Secondary | ICD-10-CM | POA: Diagnosis not present

## 2019-08-24 NOTE — Progress Notes (Signed)
Patient referred by Mayra Neer, MD for tachcyardia  Subjective:   Michelle Johnston, female    DOB: December 17, 1950, 69 y.o.   MRN: [id number]   Chief Complaint  Patient presents with  . Atrial Fibrillation     HPI  69 y.o. Caucasian female with hypertension, mod AI, mod MR, paroxysmal Afib, h/o tobacco dependance, depression.  Patient underwent successful cardioversion on 08/15/2019 for Afib. She has felt better since then. She only has occasional shortness of breath.   Current Outpatient Medications on File Prior to Visit  Medication Sig Dispense Refill  . acetaminophen (TYLENOL) 500 MG tablet Take 1,000 mg by mouth every 6 (six) hours as needed. pain    . albuterol (VENTOLIN HFA) 108 (90 Base) MCG/ACT inhaler Inhale 1-2 puffs into the lungs every 6 (six) hours as needed for wheezing or shortness of breath.    Marland Kitchen amLODipine (NORVASC) 5 MG tablet Take 5 mg by mouth at bedtime.     Marland Kitchen b complex vitamins tablet Take 1 tablet by mouth daily.    . calcium carbonate (OS-CAL - DOSED IN MG OF ELEMENTAL CALCIUM) 1250 (500 Ca) MG tablet Take 1 tablet by mouth daily with breakfast.    . cetirizine (ZYRTEC) 10 MG tablet Take 10 mg by mouth at bedtime.    . cholecalciferol (VITAMIN D) 1000 UNITS tablet Take 1,000 Units by mouth daily.    Marland Kitchen FLUoxetine (PROZAC) 40 MG capsule Take 40 mg by mouth daily.     . Fluticasone-Salmeterol (ADVAIR) 250-50 MCG/DOSE AEPB Inhale 1 puff into the lungs 2 (two) times daily as needed (asthma).    . guaiFENesin (MUCINEX) 600 MG 12 hr tablet Take 600 mg by mouth daily as needed for cough.     . latanoprost (XALATAN) 0.005 % ophthalmic solution Place 1 drop into both eyes every morning.     . metoprolol tartrate (LOPRESSOR) 25 MG tablet Take 1 tablet (25 mg total) by mouth 2 (two) times daily. 180 tablet 3  . montelukast (SINGULAIR) 10 MG tablet Take 10 mg by mouth at bedtime.    . Multiple Vitamins-Minerals (MULTIVITAMIN WITH MINERALS) tablet Take 1 tablet by mouth  daily.    . Omega-3 Fatty Acids (FISH OIL) 1000 MG CAPS Take 1,000 mg by mouth daily.    . Probiotic Product (PROBIOTIC DAILY PO) Take 1 capsule by mouth daily.    . rivaroxaban (XARELTO) 20 MG TABS tablet Take 1 tablet (20 mg total) by mouth daily with supper. 90 tablet 1  . Turmeric 500 MG CAPS Take 500 mg by mouth daily.    . vitamin C (ASCORBIC ACID) 500 MG tablet Take 500 mg by mouth daily.     No current facility-administered medications on file prior to visit.    Cardiovascular and other pertinent studies:  EKG 08/24/2019: Atrial fibrillation, controlled ventricular rate 74 bpm Left anterior fascicular block  Cardioversion 08/15/2019  Lexiscan (Walking with mod Bruce)Tetrofosmin Stress Test  08/08/2019: Nondiagnostic ECG stress. Underlying A. Fibrillation. Myocardial perfusion is normal. Overall LV systolic function is abnormal without regional wall motion abnormalities. Stress LV EF: 43%.  Findings may represent non ischemic cardiomyopathy or LVEF may be underestimated due to difficulty in gating due to underlying A. Fibrillation. Correlate with echocardiogram. Low risk study.  No previous exam available for comparison.   Echocardiogram 03/07/2019:  Normal LV systolic function with visual EF 55-60%. Mild concentric  hypertrophy of the left ventricle. Left ventricle cavity is normal in  size. Normal global wall  motion. Doppler evidence of grade I (impaired)  diastolic dysfunction, normal LAP.  Trileaflet aortic valve. Moderate (Grade III) aortic regurgitation.  Mild mitral valve leaflet thickening. Moderate (Grade III) mitral  regurgitation.  Mild tricuspid regurgitation. Estimated pulmonary artery systolic pressure  is 24 mmHg.   Recent labs: 06/09/2019: Glucose 87, BUN/Cr 16/0.65. EGFR 90. Na/K 134/4.3. Rest of the CMP normal H/H 13/38. MCV 100. Platelets 311 TSH 1.9 normal   02/04/2019: Glucose 164, BUN/Cr 13/0.42. EGFR >60. Na/K 131/3.5. Rest of the CMP normal H/H  10.9/33.1. MCV 100.9. Platelets 222   11/21/2018: Glucose 83, BUN/Cr 11/0.51. EGFR normal. Na/K 133/4.3. Rest of the CMP normal Chol 244, TG 58, HDL 94, LDL 139   Review of Systems  Cardiovascular: Negative for chest pain, dyspnea on exertion, leg swelling, palpitations and syncope.  Musculoskeletal: Positive for joint pain.         Vitals:   08/24/19 1051  BP: 125/89  Pulse: 77  Resp: 17  SpO2: 98%     Body mass index is 25.63 kg/m. Filed Weights   08/24/19 1051  Weight: 154 lb (69.9 kg)     Objective:   Physical Exam Vitals and nursing note reviewed.  Constitutional:      Appearance: She is well-developed.  Neck:     Vascular: No JVD.  Cardiovascular:     Rate and Rhythm: Normal rate. Rhythm irregularly irregular.     Pulses: Intact distal pulses.     Heart sounds: Normal heart sounds. No murmur heard.   Pulmonary:     Effort: Pulmonary effort is normal.     Breath sounds: Normal breath sounds. No wheezing or rales.           Assessment & Recommendations:   69 y.o. Caucasian female with hypertension, mod AI, mod MR, paroxysmal Afib, h/o tobacco dependance, depression.  Paroxysmal Afib: S/p successful cardioversion 08/15/2019, however now with recurrence of Afib. Given her excellent rate control and minimal symptoms, reasonable to continue with rate control therapy at this time. Continue metoprolol tartarate 25 mg bid. In future, if she develops RVR, or significant symptoms such as dyspnea, fatigue, could consider AAD or ablation. I would like obtain calcium score to assess atherosclerotic burden before starting AAD. CHA2DS2VASc score 3, annual stroke risk 3.6% Conitnue Xarelto 20 mg daily.  Cautioned regarding avoiding use of NSAIDs, and reducing alcohol intake.  Mod AI, mod MR: Clinically stable.  Has repeat echocardiogram scheduled for 01/2020.  Hypertension: Controlled.  F/u in 6 months  Marguerite Barba Esther Hardy, MD [facility]  [place] Pager: [phone] Office: [phone]

## 2019-09-05 ENCOUNTER — Other Ambulatory Visit: Payer: Medicare HMO

## 2019-09-08 DIAGNOSIS — I351 Nonrheumatic aortic (valve) insufficiency: Secondary | ICD-10-CM | POA: Diagnosis not present

## 2019-09-08 DIAGNOSIS — I10 Essential (primary) hypertension: Secondary | ICD-10-CM | POA: Diagnosis not present

## 2019-09-08 DIAGNOSIS — I4891 Unspecified atrial fibrillation: Secondary | ICD-10-CM | POA: Diagnosis not present

## 2019-09-08 DIAGNOSIS — J42 Unspecified chronic bronchitis: Secondary | ICD-10-CM | POA: Diagnosis not present

## 2019-09-12 DIAGNOSIS — H6983 Other specified disorders of Eustachian tube, bilateral: Secondary | ICD-10-CM | POA: Diagnosis not present

## 2019-09-12 DIAGNOSIS — H903 Sensorineural hearing loss, bilateral: Secondary | ICD-10-CM | POA: Diagnosis not present

## 2019-09-14 ENCOUNTER — Ambulatory Visit: Payer: Medicare HMO | Admitting: Cardiology

## 2019-09-18 ENCOUNTER — Encounter: Payer: Self-pay | Admitting: Orthopaedic Surgery

## 2019-09-18 ENCOUNTER — Ambulatory Visit (INDEPENDENT_AMBULATORY_CARE_PROVIDER_SITE_OTHER): Payer: Medicare HMO

## 2019-09-18 ENCOUNTER — Ambulatory Visit: Payer: Medicare HMO | Admitting: Orthopaedic Surgery

## 2019-09-18 VITALS — Ht 65.0 in | Wt 150.0 lb

## 2019-09-18 DIAGNOSIS — Z96652 Presence of left artificial knee joint: Secondary | ICD-10-CM

## 2019-09-18 NOTE — Progress Notes (Signed)
Office Visit Note   Patient: Michelle Johnston           Date of Birth: 10-21-1950           MRN: [id number] Visit Date: 09/18/2019              Requested by: Mayra Neer, MD [address] [place],  [place] [id number] PCP: Mayra Neer, MD   Assessment & Plan: Visit Diagnoses:  1. Status post total left knee replacement     Plan: From the standpoint of her left knee, she can follow-up as needed since that knee is doing so well.  I do not feel the need to x-ray it again unless she starts developing swelling or problems with that left knee.  Her right knee has known osteoarthritis in it.  If things start bothering her enough that she would like [place] to at least aspirate the knee again and try a steroid injection followed by hyaluronic acid, she will let us know.  Obviously at some point she may end up needing a knee replacement if it bothers her enough and conservative treatment fails.  All questions and concerns were answered and addressed.  Follow-up as otherwise as needed.  Follow-Up Instructions: Return if symptoms worsen or fail to improve.   Orders:  Orders Placed This Encounter  Procedures  . XR Knee 1-2 Views Left   No orders of the defined types were placed in this encounter.     Procedures: No procedures performed   Clinical Data: No additional findings.   Subjective: Chief Complaint  Patient presents with  . Left Knee - Follow-up    02/03/2019 Left TKA  The patient is now 7/2 months status post a left total knee arthroplasty.  She is 24 and very active.  This was a press-fit knee system.  She has no complaints with the left knee at all.  Her right knee is in an arthritic knee.  It is swollen to her.  Does not have a lot of pain and she states that the hyaluronic acid injection she had has helped with that knee.  She is walking without an assistive device and has really no issues today or acute changes in her medical status.  HPI  Review of  Systems She currently denies any headache, chest pain, shortness of breath, fever, chills, nausea, vomiting  Objective: Vital Signs: Ht 5\' 5"  (1.651 m)   Wt 150 lb (68 kg)   BMI 24.96 kg/m   Physical Exam She is alert and oriented x3 and in no acute distress Ortho Exam Examination of her left operative knee shows that the range of motion is full.  There is no effusion.  There is no warmth.  The incision is healed nicely.  The knee is ligamentously stable.  Her right knee does show a mild effusion.  There is mainly lateral joint line tenderness and some patellofemoral crepitation on the right knee. Specialty Comments:  No specialty comments available.  Imaging: XR Knee 1-2 Views Left  Result Date: 09/18/2019 2 views of the left knee show well-seated press-fit total knee arthroplasty with no complicating features.  There is no effusion.  The alignment is anatomic.  There is no evidence of loosening.    PMFS History: Patient Active Problem List   Diagnosis Date Noted  . Primary osteoarthritis of right knee 07/12/2019  . Paroxysmal atrial fibrillation ([place]) 06/16/2019  . Nonrheumatic aortic valve insufficiency 06/16/2019  . Nonrheumatic mitral valve regurgitation 06/16/2019  .  Tachycardia 02/20/2019  . Abnormal EKG 02/20/2019  . Essential hypertension 02/20/2019  . Status post total left knee replacement 02/03/2019  . Unilateral primary osteoarthritis, left knee 11/30/2016  . Chronic pain of left knee 11/30/2016   Past Medical History:  Diagnosis Date  . Arthritis   . Bradycardia   . Bronchitis   . Depression   . Glaucoma   . Hypertension   . Seasonal allergies   . Wears glasses     Family History  Problem Relation Age of Onset  . Hypertension Mother   . Hyperlipidemia Mother   . Lymphoma Mother   . Lung cancer Father     Past Surgical History:  Procedure Laterality Date  . ABDOMINAL HYSTERECTOMY  1990  . BREAST BIOPSY Right    benign  . CARDIOVERSION N/A  08/15/2019   Procedure: CARDIOVERSION;  Surgeon: Nigel Mormon, MD;  [facility];  Service: Cardiovascular;  Laterality: N/A;  . COLONOSCOPY    . meniscus tear knee     left knee  . NASAL SINUS SURGERY  1975  . ORIF ANKLE FRACTURE  2009   left  . TOTAL KNEE ARTHROPLASTY Left 02/03/2019   Procedure: LEFT TOTAL KNEE ARTHROPLASTY;  Surgeon: Mcarthur Rossetti, MD;  Location: [facility];  Service: Orthopedics;  Laterality: Left;  . TURBINATE REDUCTION Bilateral 06/04/2014   Procedure: BILATERAL TURBINATE REDUCTION;  Surgeon: Leta Baptist, MD;  Location: [facility];  Service: ENT;  Laterality: Bilateral;  . TYMPANOSTOMY TUBE PLACEMENT     left   Social History   Occupational History  . Not on file  Tobacco Use  . Smoking status: Former Smoker    Packs/day: 2.00    Years: 30.00    Pack years: 60.00    Types: Cigarettes    Quit date: 05/28/1998    Years since quitting: 21.3  . Smokeless tobacco: Never Used  Vaping Use  . Vaping Use: Never used  Substance and Sexual Activity  . Alcohol use: Yes    Comment: daily-wine daily-1 glass  . Drug use: No  . Sexual activity: Not on file

## 2019-11-16 DIAGNOSIS — M47817 Spondylosis without myelopathy or radiculopathy, lumbosacral region: Secondary | ICD-10-CM | POA: Diagnosis not present

## 2019-11-16 DIAGNOSIS — M545 Low back pain: Secondary | ICD-10-CM | POA: Diagnosis not present

## 2019-11-21 DIAGNOSIS — H401332 Pigmentary glaucoma, bilateral, moderate stage: Secondary | ICD-10-CM | POA: Diagnosis not present

## 2019-11-27 DIAGNOSIS — M545 Low back pain: Secondary | ICD-10-CM | POA: Diagnosis not present

## 2019-11-27 DIAGNOSIS — M47817 Spondylosis without myelopathy or radiculopathy, lumbosacral region: Secondary | ICD-10-CM | POA: Diagnosis not present

## 2019-12-05 DIAGNOSIS — Z23 Encounter for immunization: Secondary | ICD-10-CM | POA: Diagnosis not present

## 2019-12-05 DIAGNOSIS — R0989 Other specified symptoms and signs involving the circulatory and respiratory systems: Secondary | ICD-10-CM | POA: Diagnosis not present

## 2019-12-05 DIAGNOSIS — R432 Parageusia: Secondary | ICD-10-CM | POA: Diagnosis not present

## 2019-12-05 DIAGNOSIS — K1379 Other lesions of oral mucosa: Secondary | ICD-10-CM | POA: Diagnosis not present

## 2019-12-05 DIAGNOSIS — R059 Cough, unspecified: Secondary | ICD-10-CM | POA: Diagnosis not present

## 2019-12-14 ENCOUNTER — Other Ambulatory Visit: Payer: Self-pay | Admitting: Family Medicine

## 2019-12-14 DIAGNOSIS — M8580 Other specified disorders of bone density and structure, unspecified site: Secondary | ICD-10-CM

## 2019-12-14 DIAGNOSIS — Z1231 Encounter for screening mammogram for malignant neoplasm of breast: Secondary | ICD-10-CM

## 2019-12-14 DIAGNOSIS — J309 Allergic rhinitis, unspecified: Secondary | ICD-10-CM | POA: Diagnosis not present

## 2019-12-14 DIAGNOSIS — I4891 Unspecified atrial fibrillation: Secondary | ICD-10-CM | POA: Diagnosis not present

## 2019-12-14 DIAGNOSIS — D6869 Other thrombophilia: Secondary | ICD-10-CM | POA: Diagnosis not present

## 2019-12-14 DIAGNOSIS — Z86010 Personal history of colonic polyps: Secondary | ICD-10-CM | POA: Diagnosis not present

## 2019-12-14 DIAGNOSIS — M4800 Spinal stenosis, site unspecified: Secondary | ICD-10-CM | POA: Diagnosis not present

## 2019-12-14 DIAGNOSIS — K219 Gastro-esophageal reflux disease without esophagitis: Secondary | ICD-10-CM | POA: Diagnosis not present

## 2019-12-14 DIAGNOSIS — F322 Major depressive disorder, single episode, severe without psychotic features: Secondary | ICD-10-CM | POA: Diagnosis not present

## 2019-12-14 DIAGNOSIS — J42 Unspecified chronic bronchitis: Secondary | ICD-10-CM | POA: Diagnosis not present

## 2019-12-14 DIAGNOSIS — R030 Elevated blood-pressure reading, without diagnosis of hypertension: Secondary | ICD-10-CM | POA: Diagnosis not present

## 2019-12-14 DIAGNOSIS — E7800 Pure hypercholesterolemia, unspecified: Secondary | ICD-10-CM | POA: Diagnosis not present

## 2019-12-14 DIAGNOSIS — Z0000 Encounter for general adult medical examination without abnormal findings: Secondary | ICD-10-CM | POA: Diagnosis not present

## 2019-12-14 DIAGNOSIS — D692 Other nonthrombocytopenic purpura: Secondary | ICD-10-CM | POA: Diagnosis not present

## 2019-12-18 ENCOUNTER — Ambulatory Visit: Payer: Medicare HMO | Admitting: Cardiology

## 2019-12-27 ENCOUNTER — Other Ambulatory Visit: Payer: Self-pay

## 2019-12-27 ENCOUNTER — Ambulatory Visit: Payer: Medicare HMO | Admitting: Cardiology

## 2019-12-27 ENCOUNTER — Encounter: Payer: Self-pay | Admitting: Cardiology

## 2019-12-27 VITALS — BP 148/90 | HR 86 | Resp 16 | Ht 65.0 in | Wt 155.0 lb

## 2019-12-27 DIAGNOSIS — I10 Essential (primary) hypertension: Secondary | ICD-10-CM

## 2019-12-27 DIAGNOSIS — I739 Peripheral vascular disease, unspecified: Secondary | ICD-10-CM | POA: Diagnosis not present

## 2019-12-27 DIAGNOSIS — I480 Paroxysmal atrial fibrillation: Secondary | ICD-10-CM

## 2019-12-27 DIAGNOSIS — I351 Nonrheumatic aortic (valve) insufficiency: Secondary | ICD-10-CM | POA: Diagnosis not present

## 2019-12-27 DIAGNOSIS — R079 Chest pain, unspecified: Secondary | ICD-10-CM | POA: Diagnosis not present

## 2019-12-27 MED ORDER — NITROGLYCERIN 0.4 MG SL SUBL: 0.4000 mg | SUBLINGUAL_TABLET | SUBLINGUAL | 3 refills | Status: DC | PRN

## 2019-12-27 MED ORDER — RIVAROXABAN 20 MG PO TABS: 20.0000 mg | ORAL_TABLET | Freq: Every day | ORAL | 2 refills | Status: DC

## 2019-12-27 NOTE — Progress Notes (Signed)
Patient referred by Mayra Neer, MD for tachcyardia  Subjective:   Michelle Johnston, female    DOB: 12/29/1950, 69 y.o.   MRN: [id number]   Chief Complaint  Patient presents with   Hypertension   Fatigue   Shortness of Breath   Follow-up     HPI  69 y.o. Caucasian female with hypertension, mod AI, mod MR, paroxysmal Afib, h/o tobacco dependance, depression.  Patient underwent successful cardioversion on 08/15/2019 for Afib, but was noted to be back in A. fib on follow-up visit 08/24/2019.  At that time, I recommended rate control management alone.  However, she has had worsening fatigue and exertional dyspnea.  She also notices exertional chest pain at times.  Of note, stress test did not show any ischemia.  Echocardiogram showed moderate AR and MR.   On a separate note, she complains of pain in both her thighs on walking.  Blood pressure elevated today, usually lower than this.   Current Outpatient Medications on File Prior to Visit  Medication Sig Dispense Refill   acetaminophen (TYLENOL) 500 MG tablet Take 1,000 mg by mouth every 6 (six) hours as needed. pain     albuterol (VENTOLIN HFA) 108 (90 Base) MCG/ACT inhaler Inhale 1-2 puffs into the lungs every 6 (six) hours as needed for wheezing or shortness of breath.     amLODipine (NORVASC) 5 MG tablet Take 5 mg by mouth at bedtime.      b complex vitamins tablet Take 1 tablet by mouth daily.     calcium carbonate (OS-CAL - DOSED IN MG OF ELEMENTAL CALCIUM) 1250 (500 Ca) MG tablet Take 1 tablet by mouth daily with breakfast.     cetirizine (ZYRTEC) 10 MG tablet Take 10 mg by mouth at bedtime.     cholecalciferol (VITAMIN D) 1000 UNITS tablet Take 1,000 Units by mouth daily.     FLUoxetine (PROZAC) 40 MG capsule Take 40 mg by mouth daily.      Fluticasone-Salmeterol (ADVAIR) 250-50 MCG/DOSE AEPB Inhale 1 puff into the lungs 2 (two) times daily as needed (asthma).     guaiFENesin (MUCINEX) 600 MG 12 hr tablet Take  600 mg by mouth daily as needed for cough.      latanoprost (XALATAN) 0.005 % ophthalmic solution Place 1 drop into both eyes every morning.      metoprolol tartrate (LOPRESSOR) 25 MG tablet Take 1 tablet (25 mg total) by mouth 2 (two) times daily. 180 tablet 3   montelukast (SINGULAIR) 10 MG tablet Take 10 mg by mouth at bedtime.     Multiple Vitamins-Minerals (MULTIVITAMIN WITH MINERALS) tablet Take 1 tablet by mouth daily.     Omega-3 Fatty Acids (FISH OIL) 1000 MG CAPS Take 1,000 mg by mouth daily.     Probiotic Product (PROBIOTIC DAILY PO) Take 1 capsule by mouth daily.     rivaroxaban (XARELTO) 20 MG TABS tablet Take 1 tablet (20 mg total) by mouth daily with supper. 90 tablet 1   Turmeric 500 MG CAPS Take 500 mg by mouth daily.     vitamin C (ASCORBIC ACID) 500 MG tablet Take 500 mg by mouth daily.     buPROPion (WELLBUTRIN XL) 150 MG 24 hr tablet Take 1 tablet by mouth daily.     clotrimazole (MYCELEX) 10 MG troche Take 1 tablet by mouth daily.     ipratropium (ATROVENT) 0.06 % nasal spray as needed.     meloxicam (MOBIC) 15 MG tablet as needed.  omeprazole (PRILOSEC) 20 MG capsule as needed.    ° °No current facility-administered medications on file prior to visit.  ° ° °Cardiovascular and other pertinent studies: ° °EKG 12/27/2019: °Atrial fibrillation 96 bpm °Nonspecific T-abnormality ° °Cardioversion 08/15/2019 ° °Lexiscan (Walking with mod Bruce)Tetrofosmin Stress Test  08/08/2019: °Nondiagnostic ECG stress. Underlying A. Fibrillation. °Myocardial perfusion is normal. °Overall LV systolic function is abnormal without regional wall motion abnormalities. °Stress LV EF: 43%.  Findings may represent non ischemic cardiomyopathy or LVEF may be underestimated due to difficulty in gating due to underlying A. Fibrillation. Correlate with echocardiogram. Low risk study.  °No previous exam available for comparison. ° ° °Echocardiogram 03/07/2019:  °Normal LV systolic function with  visual EF 55-60%. Mild concentric  °hypertrophy of the left ventricle. Left ventricle cavity is normal in  °size. Normal global wall motion. Doppler evidence of grade I (impaired)  °diastolic dysfunction, normal LAP.  °Trileaflet aortic valve.  Moderate (Grade III) aortic regurgitation.  °Mild mitral valve leaflet thickening. Moderate (Grade III) mitral  °regurgitation.  °Mild tricuspid regurgitation. Estimated pulmonary artery systolic pressure  °is 24 mmHg. ° ° °Recent labs: °06/09/2019: °Glucose 87, BUN/Cr 16/0.65. EGFR 90. Na/K 134/4.3. Rest of the CMP normal °H/H 13/38. MCV 100. Platelets 311 °TSH 1.9 normal ° ° °02/04/2019: °Glucose 164, BUN/Cr 13/0.42. EGFR >60. Na/K 131/3.5. Rest of the CMP normal °H/H 10.9/33.1. MCV 100.9. Platelets 222 ° ° °11/21/2018: °Glucose 83, BUN/Cr 11/0.51. EGFR normal. Na/K 133/4.3. Rest of the CMP normal °Chol 244, TG 58, HDL 94, LDL 139 ° ° °Review of Systems  °Constitutional: Positive for malaise/fatigue.  °Cardiovascular: Positive for chest pain, claudication and dyspnea on exertion. Negative for leg swelling, palpitations and syncope.  °Musculoskeletal: Positive for joint pain.  ° ° °   ° ° °Vitals:  ° 12/27/19 0927 12/27/19 0930  °BP: (!) 152/96 (!) 148/90  °Pulse: 66 86  °Resp: 16   °SpO2: 98%   ° ° ° °Body mass index is 25.79 kg/m². °Filed Weights  ° 12/27/19 0927  °Weight: 155 lb (70.3 kg)  ° ° ° °Objective:  ° Physical Exam °Vitals and nursing note reviewed.  °Constitutional:   °   Appearance: She is well-developed.  °Neck:  °   Vascular: No JVD.  °Cardiovascular:  °   Rate and Rhythm: Normal rate. Rhythm irregularly irregular.  °   Pulses: Intact distal pulses.     °     Femoral pulses are 3+ on the right side and 3+ on the left side. °     Dorsalis pedis pulses are 1+ on the right side and 0 on the left side.  °     Posterior tibial pulses are 1+ on the right side and 1+ on the left side.  °   Heart sounds: Normal heart sounds. No murmur heard.  ° °Pulmonary:  °   Effort:  Pulmonary effort is normal.  °   Breath sounds: Normal breath sounds. No wheezing or rales.  ° ° ° ° ° °   ° °Assessment & Recommendations:  ° °69 y.o. Caucasian female with hypertension, mod AI, mod MR, paroxysmal Afib, h/o tobacco dependance, depression. ° °Paroxysmal Afib: °Successful cardioversion on 08/15/2019, but did not sustain sinus rhythm. °She is now symptomatic with exertional dyspnea as well as chest pain.  Symptoms most likely emanating from A. fib.  However, CAD remains in the differential, in spite of no ischemia on stress testing.  Recommend calcium score scan for further stratification.  If calcium score   is normal, will start flecainide 50 mg twice daily, followed by attempted cardioversion.  If calcium score is elevated, will consider Multaq,  followed by cardioversion, as well as further work-up and management for CAD. For now, added sublingual nitroglycerin for as needed use. CHA2DS2VASc score 3, annual stroke risk 3.6% Conitnue Xarelto 20 mg daily.  Cautioned regarding avoiding use of NSAIDs, and reducing alcohol intake.  Mod AI, mod MR: Less likely to be etiology of patient's symptoms.  Nonetheless, will repeat echocardiogram in 01/2020.  Hypertension: Generally well controlled.  We will recheck at next visit.  Claudication: New symptom.  No critical limb ischemia on exam.  Will obtain ABI.   Nigel Mormon, MD [facility] [place] Pager: [phone] Office: [phone]

## 2020-01-02 ENCOUNTER — Other Ambulatory Visit: Payer: Self-pay

## 2020-01-02 ENCOUNTER — Ambulatory Visit: Payer: Medicare HMO

## 2020-01-02 DIAGNOSIS — I739 Peripheral vascular disease, unspecified: Secondary | ICD-10-CM | POA: Diagnosis not present

## 2020-01-11 ENCOUNTER — Ambulatory Visit
Admission: RE | Admit: 2020-01-11 | Discharge: 2020-01-11 | Disposition: A | Discharge status: Home / Self Care | Payer: Medicare HMO | Source: Ambulatory Visit | Attending: Family Medicine | Admitting: Family Medicine

## 2020-01-11 ENCOUNTER — Other Ambulatory Visit: Payer: Self-pay

## 2020-01-11 DIAGNOSIS — Z1231 Encounter for screening mammogram for malignant neoplasm of breast: Secondary | ICD-10-CM | POA: Diagnosis not present

## 2020-01-23 ENCOUNTER — Ambulatory Visit: Payer: Medicare HMO | Admitting: Vascular Surgery

## 2020-01-23 ENCOUNTER — Encounter: Payer: Self-pay | Admitting: Vascular Surgery

## 2020-01-23 ENCOUNTER — Other Ambulatory Visit: Payer: Self-pay

## 2020-01-23 VITALS — BP 126/72 | HR 66 | Temp 97.5°F | Resp 20 | Ht 65.0 in | Wt 158.0 lb

## 2020-01-23 DIAGNOSIS — M48062 Spinal stenosis, lumbar region with neurogenic claudication: Secondary | ICD-10-CM | POA: Diagnosis not present

## 2020-01-23 NOTE — Progress Notes (Signed)
ASSESSMENT & PLAN:  69 y.o. female without evidence of hemodynamically significant peripheral arterial disease on exam today. Her symptoms seem more consistent with pseudoclaudication. Recommend return to primary care and consideration of lumbar imaging. Follow up with me as needed.  CHIEF COMPLAINT:   Unsteadiness, difficulty walking  HISTORY:  HISTORY OF PRESENT ILLNESS: Michelle Johnston is a 69 y.o. female with lower extremity discomfort and unsteadiness beginning about the time she was diagnosed with atrial fibrillation (about one year ago). She reports unsteadiness on her feet.  She has trouble with her balance as she moves without support.  She reports a discomfort in her thighs and calves which is relieved by rest.  She does not report any relief with positional changes.  He has a history of arthritis in her left knee and is status post knee replacement.  Has been getting her articular injections some relief.  Past Medical History:  Diagnosis Date  . Arthritis   . Bradycardia   . Bronchitis   . Depression   . Glaucoma   . Hypertension   . Seasonal allergies   . Wears glasses     Past Surgical History:  Procedure Laterality Date  . ABDOMINAL HYSTERECTOMY  1990  . BREAST BIOPSY Right    benign  . CARDIOVERSION N/A 08/15/2019   Procedure: CARDIOVERSION;  Surgeon: Nigel Mormon, MD;  Location: [facility];  Service: Cardiovascular;  Laterality: N/A;  . COLONOSCOPY    . meniscus tear knee     left knee  . NASAL SINUS SURGERY  1975  . ORIF ANKLE FRACTURE  2009   left  . TOTAL KNEE ARTHROPLASTY Left 02/03/2019   Procedure: LEFT TOTAL KNEE ARTHROPLASTY;  Surgeon: Mcarthur Rossetti, MD;  Location: [facility];  Service: Orthopedics;  Laterality: Left;  . TURBINATE REDUCTION Bilateral 06/04/2014   Procedure: BILATERAL TURBINATE REDUCTION;  Surgeon: Leta Baptist, MD;  Location: [place];  Service: ENT;  Laterality: Bilateral;  . TYMPANOSTOMY TUBE PLACEMENT      left    Family History  Problem Relation Age of Onset  . Hypertension Mother   . Hyperlipidemia Mother   . Lymphoma Mother   . Lung cancer Father     Social History   Socioeconomic History  . Marital status: Married    Spouse name: Not on file  . Number of children: 0  . Years of education: Not on file  . Highest education level: Not on file  Occupational History  . Not on file  Tobacco Use  . Smoking status: Former Smoker    Packs/day: 2.00    Years: 30.00    Pack years: 60.00    Types: Cigarettes    Quit date: 05/28/1998    Years since quitting: 21.6  . Smokeless tobacco: Never Used  Vaping Use  . Vaping Use: Never used  Substance and Sexual Activity  . Alcohol use: Yes    Comment: daily-wine daily-1 glass  . Drug use: No  . Sexual activity: Not on file  Other Topics Concern  . Not on file  Social History Narrative  . Not on file   Social Determinants of Health   Financial Resource Strain:   . Difficulty of Paying Living Expenses: Not on file  Food Insecurity:   . Worried About Charity fundraiser in the Last Year: Not on file  . Ran Out of Food in the Last Year: Not on file  Transportation Needs:   . Lack of  Transportation (Medical): Not on file  . Lack of Transportation (Non-Medical): Not on file  Physical Activity:   . Days of Exercise per Week: Not on file  . Minutes of Exercise per Session: Not on file  Stress:   . Feeling of Stress : Not on file  Social Connections:   . Frequency of Communication with Friends and Family: Not on file  . Frequency of Social Gatherings with Friends and Family: Not on file  . Attends Religious Services: Not on file  . Active Member of Clubs or Organizations: Not on file  . Attends Archivist Meetings: Not on file  . Marital Status: Not on file  Intimate Partner Violence:   . Fear of Current or Ex-Partner: Not on file  . Emotionally Abused: Not on file  . Physically Abused: Not on file  . Sexually  Abused: Not on file    Allergies  Allergen Reactions  . Ceclor [Cefaclor] Hives  . Ciprofloxacin Hives  . Flagyl [Metronidazole] Hives    Current Outpatient Medications  Medication Sig Dispense Refill  . acetaminophen (TYLENOL) 500 MG tablet Take 1,000 mg by mouth every 6 (six) hours as needed. pain    . albuterol (VENTOLIN HFA) 108 (90 Base) MCG/ACT inhaler Inhale 1-2 puffs into the lungs every 6 (six) hours as needed for wheezing or shortness of breath.    Marland Kitchen amLODipine (NORVASC) 5 MG tablet Take 5 mg by mouth at bedtime.     Marland Kitchen b complex vitamins tablet Take 1 tablet by mouth daily.    Marland Kitchen buPROPion (WELLBUTRIN XL) 150 MG 24 hr tablet Take 1 tablet by mouth daily.    . calcium carbonate (OS-CAL - DOSED IN MG OF ELEMENTAL CALCIUM) 1250 (500 Ca) MG tablet Take 1 tablet by mouth daily with breakfast.    . cetirizine (ZYRTEC) 10 MG tablet Take 10 mg by mouth at bedtime.    . cholecalciferol (VITAMIN D) 1000 UNITS tablet Take 1,000 Units by mouth daily.    . clotrimazole (MYCELEX) 10 MG troche Take 1 tablet by mouth daily.    Marland Kitchen FLUoxetine (PROZAC) 40 MG capsule Take 40 mg by mouth daily.     . Fluticasone-Salmeterol (ADVAIR) 250-50 MCG/DOSE AEPB Inhale 1 puff into the lungs 2 (two) times daily as needed (asthma).    . guaiFENesin (MUCINEX) 600 MG 12 hr tablet Take 600 mg by mouth daily as needed for cough.     Marland Kitchen ipratropium (ATROVENT) 0.06 % nasal spray as needed.    . latanoprost (XALATAN) 0.005 % ophthalmic solution Place 1 drop into both eyes every morning.     . meloxicam (MOBIC) 15 MG tablet as needed.    . montelukast (SINGULAIR) 10 MG tablet Take 10 mg by mouth at bedtime.    . Multiple Vitamins-Minerals (MULTIVITAMIN WITH MINERALS) tablet Take 1 tablet by mouth daily.    . nitroGLYCERIN (NITROSTAT) 0.4 MG SL tablet Place 1 tablet (0.4 mg total) under the tongue every 5 (five) minutes as needed. 30 tablet 3  . Omega-3 Fatty Acids (FISH OIL) 1000 MG CAPS Take 1,000 mg by mouth daily.      Marland Kitchen omeprazole (PRILOSEC) 20 MG capsule as needed.    . Probiotic Product (PROBIOTIC DAILY PO) Take 1 capsule by mouth daily.    . rivaroxaban (XARELTO) 20 MG TABS tablet Take 1 tablet (20 mg total) by mouth daily with supper. 90 tablet 2  . Turmeric 500 MG CAPS Take 500 mg by mouth daily.    Marland Kitchen  vitamin C (ASCORBIC ACID) 500 MG tablet Take 500 mg by mouth daily.    . metoprolol tartrate (LOPRESSOR) 25 MG tablet Take 1 tablet (25 mg total) by mouth 2 (two) times daily. 180 tablet 3   No current facility-administered medications for this visit.    REVIEW OF SYSTEMS:  [X]  denotes positive finding, [ ]  denotes negative finding Cardiac  Comments:  Chest pain or chest pressure:    Shortness of breath upon exertion: x   Short of breath when lying flat:    Irregular heart rhythm:        Vascular    Pain in calf, thigh, or hip brought on by ambulation: x   Pain in feet at night that wakes you up from your sleep:     Blood clot in your veins:    Leg swelling:         Pulmonary    Oxygen at home:    Productive cough:     Wheezing:         Neurologic    Sudden weakness in arms or legs:  x legs  Sudden numbness in arms or legs:     Sudden onset of difficulty speaking or slurred speech:    Temporary loss of vision in one eye:     Problems with dizziness:         Gastrointestinal    Blood in stool:     Vomited blood:         Genitourinary    Burning when urinating:     Blood in urine:        Psychiatric    Major depression:         Hematologic    Bleeding problems:    Problems with blood clotting too easily:        Skin    Rashes or ulcers:        Constitutional    Fever or chills:     PHYSICAL EXAM:   Vitals:   01/23/20 1416  BP: 126/72  Pulse: 66  Resp: 20  Temp: (!) 97.5 F (36.4 C)  SpO2: 95%  Weight: 158 lb (71.7 kg)  Height: 5\' 5"  (1.651 m)   Constitutional: Well appearing in no distress. Appears well nourished.  Neurologic: Normal gait and station. CN  intact. No weakness. No sensory loss. Psychiatric: Mood and affect symmetric and appropriate. Eyes: No icterus. No conjunctival pallor. Ears, nose, throat: mucous membranes moist. Midline trachea. No carotid bruit. Cardiac: irregularly irregular Respiratory: unlabored. Abdominal: soft, non-tender, non-distended. No palpable pulsatile abdominal mass. Peripheral vascular:  Femoral pulse: L 2+ / R 2+  Dorsalis pedis pulse: L 1+ / R 2+  Posterior tibial pulse: L 1+ / R 2+ Extremity: No edema. No cyanosis. No pallor.  Skin: No gangrene. No ulceration.  Lymphatic: No Stemmer's sign. No palpable lymphadenopathy.   DATA REVIEW:    Most recent CBC CBC Latest Ref Rng & Units 08/10/2019 02/04/2019 02/02/2019  WBC 3.4 - 10.8 x10E3/uL 5.2 7.7 4.0  Hemoglobin 11.1 - 15.9 g/dL 13.1 10.9(L) 13.0  Hematocrit 34.0 - 46.6 % 38.4 33.1(L) 38.6  Platelets 150 - 400 K/uL - 222 260     Most recent CMP CMP Latest Ref Rng & Units 08/10/2019 02/04/2019 02/02/2019  Glucose 65 - 99 mg/dL 103(H) 164(H) 101(H)  BUN 8 - 27 mg/dL 22 13 20   Creatinine 0.57 - 1.00 mg/dL 0.70 0.42(L) 0.51  Sodium 134 - 144 mmol/L 135 131(L) 135  Potassium 3.5 -  5.2 mmol/L 4.5 3.5 4.4  Chloride 96 - 106 mmol/L 99 100 100  CO2 20 - 29 mmol/L 23 21(L) 25  Calcium 8.7 - 10.3 mg/dL 9.4 8.3(L) 9.5  Total Protein 6.0 - 8.3 g/dL - - -  Total Bilirubin 0.3 - 1.2 mg/dL - - -  Alkaline Phos 39 - 117 U/L - - -  AST 0 - 37 U/L - - -  ALT 0 - 35 U/L - - -    Renal function CrCl cannot be calculated (Patient's most recent lab result is older than the maximum 21 days allowed.).  No results found for: HGBA1C  No results found for: LDLCALC, LDLC, HIRISKLDL, POCLDL, LDLDIRECT, REALLDLC, TOTLDLC   Outside ABI reviewed L 0.92 R 1.0  Michelle Johnston. Stanford Breed, MD [facility] Phone Number: [phone] 01/23/2020 2:53 PM

## 2020-01-31 ENCOUNTER — Ambulatory Visit: Payer: Medicare HMO | Admitting: Cardiology

## 2020-01-31 ENCOUNTER — Encounter: Payer: Self-pay | Admitting: Cardiology

## 2020-01-31 ENCOUNTER — Other Ambulatory Visit: Payer: Self-pay

## 2020-01-31 VITALS — BP 133/81 | HR 79 | Resp 16 | Ht 60.0 in | Wt 154.0 lb

## 2020-01-31 DIAGNOSIS — I351 Nonrheumatic aortic (valve) insufficiency: Secondary | ICD-10-CM

## 2020-01-31 DIAGNOSIS — I4819 Other persistent atrial fibrillation: Secondary | ICD-10-CM | POA: Diagnosis not present

## 2020-01-31 DIAGNOSIS — I10 Essential (primary) hypertension: Secondary | ICD-10-CM

## 2020-01-31 MED ORDER — FLECAINIDE ACETATE 50 MG PO TABS: 50.0000 mg | ORAL_TABLET | Freq: Two times a day (BID) | ORAL | 3 refills | Status: DC

## 2020-01-31 NOTE — Progress Notes (Signed)
Patient referred by Mayra Neer, MD for tachcyardia  Subjective:   Michelle Johnston, female    DOB: June 09, 1950, 69 y.o.   MRN: [id number]   Chief Complaint  Patient presents with  . Essential hypertension  . Follow-up     HPI  69 y.o. Caucasian female with hypertension, mod AI, mod MR, symptomatic paroxysmal Afib, h/o tobacco dependance, depression.  Patient continues to have exertional dyspnea without chest pain. Recent ABI and CT cardiac scoring discussed with the patient, details below.     Current Outpatient Medications on File Prior to Visit  Medication Sig Dispense Refill  . acetaminophen (TYLENOL) 500 MG tablet Take 1,000 mg by mouth every 6 (six) hours as needed. pain    . albuterol (VENTOLIN HFA) 108 (90 Base) MCG/ACT inhaler Inhale 1-2 puffs into the lungs every 6 (six) hours as needed for wheezing or shortness of breath.    Marland Kitchen amLODipine (NORVASC) 5 MG tablet Take 5 mg by mouth at bedtime.     Marland Kitchen b complex vitamins tablet Take 1 tablet by mouth daily.    Marland Kitchen buPROPion (WELLBUTRIN XL) 150 MG 24 hr tablet Take 1 tablet by mouth daily.    . calcium carbonate (OS-CAL - DOSED IN MG OF ELEMENTAL CALCIUM) 1250 (500 Ca) MG tablet Take 1 tablet by mouth daily with breakfast.    . cetirizine (ZYRTEC) 10 MG tablet Take 10 mg by mouth at bedtime.    . cholecalciferol (VITAMIN D) 1000 UNITS tablet Take 1,000 Units by mouth daily.    . clotrimazole (MYCELEX) 10 MG troche Take 1 tablet by mouth daily.    Marland Kitchen FLUoxetine (PROZAC) 40 MG capsule Take 40 mg by mouth daily.     . Fluticasone-Salmeterol (ADVAIR) 250-50 MCG/DOSE AEPB Inhale 1 puff into the lungs 2 (two) times daily as needed (asthma).    . guaiFENesin (MUCINEX) 600 MG 12 hr tablet Take 600 mg by mouth daily as needed for cough.     Marland Kitchen ipratropium (ATROVENT) 0.06 % nasal spray as needed.    . latanoprost (XALATAN) 0.005 % ophthalmic solution Place 1 drop into both eyes every morning.     . meloxicam (MOBIC) 15 MG tablet as  needed.    . metoprolol tartrate (LOPRESSOR) 25 MG tablet Take 1 tablet (25 mg total) by mouth 2 (two) times daily. 180 tablet 3  . montelukast (SINGULAIR) 10 MG tablet Take 10 mg by mouth at bedtime.    . Multiple Vitamins-Minerals (MULTIVITAMIN WITH MINERALS) tablet Take 1 tablet by mouth daily.    . nitroGLYCERIN (NITROSTAT) 0.4 MG SL tablet Place 1 tablet (0.4 mg total) under the tongue every 5 (five) minutes as needed. 30 tablet 3  . Omega-3 Fatty Acids (FISH OIL) 1000 MG CAPS Take 1,000 mg by mouth daily.    Marland Kitchen omeprazole (PRILOSEC) 20 MG capsule as needed.    . Probiotic Product (PROBIOTIC DAILY PO) Take 1 capsule by mouth daily.    . rivaroxaban (XARELTO) 20 MG TABS tablet Take 1 tablet (20 mg total) by mouth daily with supper. 90 tablet 2  . Turmeric 500 MG CAPS Take 500 mg by mouth daily.    . vitamin C (ASCORBIC ACID) 500 MG tablet Take 500 mg by mouth daily.     No current facility-administered medications on file prior to visit.    Cardiovascular and other pertinent studies:  CT cardiac scoring 01/11/2020: 1. Calcium score of 0. This is between 0 and 25th percentile for females between  the ages of 57 and 4. This corresponds to no identifiable plaque. There is very low, generally less than 5% risk of coronary artery disease     ABI 01/07/2020: This exam reveals mildly decreased perfusion of the right lower extremity,  noted at the dorsalis pedis artery level (ABI 0.92) and normal perfusion  of the left lower extremity (ABI 1.00). Mildly diminished resting PVR  waveform in the right PT and left DP suggests mild diffuse disease.   EKG 12/27/2019: Atrial fibrillation 96 bpm Nonspecific T-abnormality  Cardioversion 08/15/2019  Lexiscan (Walking with mod Bruce)Tetrofosmin Stress Test  08/08/2019: Nondiagnostic ECG stress. Underlying A. Fibrillation. Myocardial perfusion is normal. Overall LV systolic function is abnormal without regional wall motion abnormalities. Stress LV  EF: 43%.  Findings may represent non ischemic cardiomyopathy or LVEF may be underestimated due to difficulty in gating due to underlying A. Fibrillation. Correlate with echocardiogram. Low risk study.  No previous exam available for comparison.   Echocardiogram 03/07/2019:  Normal LV systolic function with visual EF 55-60%. Mild concentric  hypertrophy of the left ventricle. Left ventricle cavity is normal in  size. Normal global wall motion. Doppler evidence of grade I (impaired)  diastolic dysfunction, normal LAP.  Trileaflet aortic valve. Moderate (Grade III) aortic regurgitation.  Mild mitral valve leaflet thickening. Moderate (Grade III) mitral  regurgitation.  Mild tricuspid regurgitation. Estimated pulmonary artery systolic pressure  is 24 mmHg.   Recent labs: 06/09/2019: Glucose 87, BUN/Cr 16/0.65. EGFR 90. Na/K 134/4.3. Rest of the CMP normal H/H 13/38. MCV 100. Platelets 311 TSH 1.9 normal   02/04/2019: Glucose 164, BUN/Cr 13/0.42. EGFR >60. Na/K 131/3.5. Rest of the CMP normal H/H 10.9/33.1. MCV 100.9. Platelets 222   11/21/2018: Glucose 83, BUN/Cr 11/0.51. EGFR normal. Na/K 133/4.3. Rest of the CMP normal Chol 244, TG 58, HDL 94, LDL 139   Review of Systems  Constitutional: Positive for malaise/fatigue.  Cardiovascular: Positive for chest pain, claudication and dyspnea on exertion. Negative for leg swelling, palpitations and syncope.  Musculoskeletal: Positive for joint pain.         Vitals:   01/31/20 1125  BP: 133/81  Pulse: 79  Resp: 16  SpO2: 95%     Body mass index is 30.08 kg/m. Filed Weights   01/31/20 1125  Weight: 154 lb (69.9 kg)     Objective:   Physical Exam Vitals and nursing note reviewed.  Constitutional:      Appearance: She is well-developed.  Neck:     Vascular: No JVD.  Cardiovascular:     Rate and Rhythm: Normal rate. Rhythm irregular.     Pulses: Intact distal pulses.          Femoral pulses are 3+ on the right side and  3+ on the left side.      Dorsalis pedis pulses are 1+ on the right side and 0 on the left side.       Posterior tibial pulses are 1+ on the right side and 1+ on the left side.     Heart sounds: Murmur heard.  Diastolic murmur is present with a grade of 2/4.   Pulmonary:     Effort: Pulmonary effort is normal.     Breath sounds: Normal breath sounds. No wheezing or rales.           Assessment & Recommendations:   69 y.o. Caucasian female with hypertension, mod AI, mod MR, paroxysmal Afib, h/o tobacco dependance, depression.  Paroxysmal Afib: Successful cardioversion on 08/15/2019, but did not  sustain sinus rhythm. She remains symptomatic with exertional dyspnea No ischemia on stress testing. Calcium score is 0.  Will start flecainide 50 mg twice daily, followed by attempted cardioversion.  CHA2DS2VASc score 3, annual stroke risk 3.6% Conitnue Xarelto 20 mg daily.  Cautioned regarding avoiding use of NSAIDs, and reducing alcohol intake.  Mod AI, mod MR: Less likely to be etiology of patient's symptoms.  Nonetheless, will repeat echocardiogram in 01/2020.  Hypertension: Generally well controlled.  We will recheck at next visit.  Claudication: Unlikely vascular. Mildly abnormal ABI on right.   Nigel Mormon, MD [facility] Pager: [phone] Office: [phone]

## 2020-02-05 ENCOUNTER — Other Ambulatory Visit: Payer: Medicare HMO

## 2020-02-06 ENCOUNTER — Ambulatory Visit: Payer: Medicare HMO

## 2020-02-06 ENCOUNTER — Other Ambulatory Visit: Payer: Self-pay

## 2020-02-06 DIAGNOSIS — I340 Nonrheumatic mitral (valve) insufficiency: Secondary | ICD-10-CM

## 2020-02-06 DIAGNOSIS — I351 Nonrheumatic aortic (valve) insufficiency: Secondary | ICD-10-CM | POA: Diagnosis not present

## 2020-02-07 ENCOUNTER — Emergency Department (HOSPITAL_COMMUNITY): Payer: Medicare HMO

## 2020-02-07 ENCOUNTER — Ambulatory Visit: Payer: Medicare HMO | Admitting: Cardiology

## 2020-02-07 ENCOUNTER — Emergency Department (HOSPITAL_COMMUNITY)
Admission: EM | Admit: 2020-02-07 | Discharge: 2020-02-07 | Disposition: A | Discharge status: Home / Self Care | Payer: Medicare HMO | Attending: Emergency Medicine | Admitting: Emergency Medicine

## 2020-02-07 ENCOUNTER — Other Ambulatory Visit: Payer: Self-pay

## 2020-02-07 ENCOUNTER — Encounter (HOSPITAL_COMMUNITY): Payer: Self-pay | Admitting: Emergency Medicine

## 2020-02-07 VITALS — BP 165/97 | HR 84

## 2020-02-07 DIAGNOSIS — I10 Essential (primary) hypertension: Secondary | ICD-10-CM | POA: Diagnosis not present

## 2020-02-07 DIAGNOSIS — R29818 Other symptoms and signs involving the nervous system: Secondary | ICD-10-CM

## 2020-02-07 DIAGNOSIS — I69392 Facial weakness following cerebral infarction: Secondary | ICD-10-CM | POA: Insufficient documentation

## 2020-02-07 DIAGNOSIS — T50905A Adverse effect of unspecified drugs, medicaments and biological substances, initial encounter: Secondary | ICD-10-CM

## 2020-02-07 DIAGNOSIS — Z96652 Presence of left artificial knee joint: Secondary | ICD-10-CM | POA: Diagnosis not present

## 2020-02-07 DIAGNOSIS — Z5181 Encounter for therapeutic drug level monitoring: Secondary | ICD-10-CM

## 2020-02-07 DIAGNOSIS — I639 Cerebral infarction, unspecified: Secondary | ICD-10-CM | POA: Diagnosis present

## 2020-02-07 DIAGNOSIS — Z20822 Contact with and (suspected) exposure to covid-19: Secondary | ICD-10-CM | POA: Insufficient documentation

## 2020-02-07 DIAGNOSIS — R0902 Hypoxemia: Secondary | ICD-10-CM | POA: Diagnosis not present

## 2020-02-07 DIAGNOSIS — I4819 Other persistent atrial fibrillation: Secondary | ICD-10-CM

## 2020-02-07 DIAGNOSIS — R079 Chest pain, unspecified: Secondary | ICD-10-CM | POA: Diagnosis not present

## 2020-02-07 DIAGNOSIS — I4891 Unspecified atrial fibrillation: Secondary | ICD-10-CM | POA: Diagnosis not present

## 2020-02-07 DIAGNOSIS — G459 Transient cerebral ischemic attack, unspecified: Secondary | ICD-10-CM | POA: Diagnosis not present

## 2020-02-07 DIAGNOSIS — R29810 Facial weakness: Secondary | ICD-10-CM | POA: Diagnosis not present

## 2020-02-07 DIAGNOSIS — T887XXA Unspecified adverse effect of drug or medicament, initial encounter: Secondary | ICD-10-CM | POA: Diagnosis not present

## 2020-02-07 DIAGNOSIS — G4489 Other headache syndrome: Secondary | ICD-10-CM | POA: Diagnosis not present

## 2020-02-07 DIAGNOSIS — Z87891 Personal history of nicotine dependence: Secondary | ICD-10-CM | POA: Diagnosis not present

## 2020-02-07 DIAGNOSIS — I213 ST elevation (STEMI) myocardial infarction of unspecified site: Secondary | ICD-10-CM | POA: Diagnosis not present

## 2020-02-07 DIAGNOSIS — R42 Dizziness and giddiness: Secondary | ICD-10-CM | POA: Diagnosis not present

## 2020-02-07 DIAGNOSIS — R4781 Slurred speech: Secondary | ICD-10-CM | POA: Diagnosis not present

## 2020-02-07 LAB — CBC
HCT: 40.5 % (ref 36.0–46.0)
Hemoglobin: 13.5 g/dL (ref 12.0–15.0)
MCH: 34.4 pg — ABNORMAL HIGH (ref 26.0–34.0)
MCHC: 33.3 g/dL (ref 30.0–36.0)
MCV: 103.1 fL — ABNORMAL HIGH (ref 80.0–100.0)
Platelets: 265 10*3/uL (ref 150–400)
RBC: 3.93 MIL/uL (ref 3.87–5.11)
RDW: 11.7 % (ref 11.5–15.5)
WBC: 5.3 10*3/uL (ref 4.0–10.5)
nRBC: 0 % (ref 0.0–0.2)

## 2020-02-07 LAB — COMPREHENSIVE METABOLIC PANEL
ALT: 21 U/L (ref 0–44)
AST: 28 U/L (ref 15–41)
Albumin: 4.2 g/dL (ref 3.5–5.0)
Alkaline Phosphatase: 67 U/L (ref 38–126)
Anion gap: 12 (ref 5–15)
BUN: 18 mg/dL (ref 8–23)
CO2: 22 mmol/L (ref 22–32)
Calcium: 9.7 mg/dL (ref 8.9–10.3)
Chloride: 101 mmol/L (ref 98–111)
Creatinine, Ser: 0.74 mg/dL (ref 0.44–1.00)
GFR, Estimated: >60 mL/min (ref >60)
Glucose, Bld: 118 mg/dL — ABNORMAL HIGH (ref 70–99)
Potassium: 4 mmol/L (ref 3.5–5.1)
Sodium: 135 mmol/L (ref 135–145)
Total Bilirubin: 0.8 mg/dL (ref 0.3–1.2)
Total Protein: 6.8 g/dL (ref 6.5–8.1)

## 2020-02-07 LAB — DIFFERENTIAL
Abs Immature Granulocytes: 0.01 10*3/uL (ref 0.00–0.07)
Basophils Absolute: 0 10*3/uL (ref 0.0–0.1)
Basophils Relative: 1 %
Eosinophils Absolute: 0.2 10*3/uL (ref 0.0–0.5)
Eosinophils Relative: 3 %
Immature Granulocytes: 0 %
Lymphocytes Relative: 32 %
Lymphs Abs: 1.7 10*3/uL (ref 0.7–4.0)
Monocytes Absolute: 0.5 10*3/uL (ref 0.1–1.0)
Monocytes Relative: 10 %
Neutro Abs: 2.9 10*3/uL (ref 1.7–7.7)
Neutrophils Relative %: 54 %

## 2020-02-07 LAB — RESP PANEL BY RT-PCR (FLU A&B, COVID) ARPGX2
Influenza A by PCR: NEGATIVE
Influenza B by PCR: NEGATIVE
SARS Coronavirus 2 by RT PCR: NEGATIVE

## 2020-02-07 LAB — TROPONIN I (HIGH SENSITIVITY)
Troponin I (High Sensitivity): 4 ng/L (ref <18)
Troponin I (High Sensitivity): 4 ng/L (ref <18)

## 2020-02-07 LAB — I-STAT CHEM 8, ED
BUN: 18 mg/dL (ref 8–23)
Calcium, Ion: 1.19 mmol/L (ref 1.15–1.40)
Chloride: 100 mmol/L (ref 98–111)
Creatinine, Ser: 0.6 mg/dL (ref 0.44–1.00)
Glucose, Bld: 116 mg/dL — ABNORMAL HIGH (ref 70–99)
HCT: 43 % (ref 36.0–46.0)
Hemoglobin: 14.6 g/dL (ref 12.0–15.0)
Potassium: 3.9 mmol/L (ref 3.5–5.1)
Sodium: 135 mmol/L (ref 135–145)
TCO2: 22 mmol/L (ref 22–32)

## 2020-02-07 LAB — PROTIME-INR
INR: 1.2 (ref 0.8–1.2)
Prothrombin Time: 15.1 seconds (ref 11.4–15.2)

## 2020-02-07 LAB — APTT: aPTT: 41 seconds — ABNORMAL HIGH (ref 24–36)

## 2020-02-07 LAB — CBG MONITORING, ED: Glucose-Capillary: 116 mg/dL — ABNORMAL HIGH (ref 70–99)

## 2020-02-07 MED ORDER — ACETAMINOPHEN 325 MG PO TABS
650.0000 mg | ORAL_TABLET | Freq: Once | ORAL | Status: AC
  Administered 2020-02-07: 650 mg via ORAL
  Filled 2020-02-07: qty 2

## 2020-02-07 MED ORDER — SODIUM CHLORIDE 0.9% FLUSH: 3.0000 mL | Freq: Once | INTRAVENOUS | Status: DC

## 2020-02-07 MED ORDER — IOHEXOL 350 MG/ML SOLN
75.0000 mL | Freq: Once | INTRAVENOUS | Status: AC | PRN
  Administered 2020-02-07: 75 mL via INTRAVENOUS

## 2020-02-07 NOTE — ED Provider Notes (Signed)
  Physical Exam  BP 127/89   Pulse 79   Temp 98.8 F (37.1 C) (Oral)   Resp 18   Ht 5\' 5"  (1.651 m)   Wt 70.3 kg   SpO2 98%   BMI 25.79 kg/m   Physical Exam  ED Course/Procedures     Procedures  MDM  Care assumed at 4 pm.  Patient took flecainide and then had facial droop.  Code stroke was activated and MRI did not show a stroke.  Signout pending second troponin and cardiology consult.  5 pm I talked to Dr. Virgina Jock from cardiology. He states that patient can stop taking flecainde for now and office will contact her regarding follow up this week   7:14 PM Second trop neg. Stable for discharge    Drenda Freeze, MD 02/07/20 [phone]

## 2020-02-07 NOTE — ED Triage Notes (Signed)
To ED via [facility] from cardiologist's office-- pt was given Flecainide at office at 10am- for the first time-- was waiting being observed for side effects, LSN 11:20-- EMS arrived 11:30--- pt developed right sided facial droop, slight aphasia at office-- equal grips, able to move all extremeties equally, strong grips bilaterally--  IV 18g Left AC  States that left hand went numb/tingling-- left side of face "quivering"

## 2020-02-07 NOTE — Discharge Instructions (Signed)
You likely have side effect from flecainide. Continue your other meds   Cardiology will contact you this week regarding follow up   Return to ER if you have trouble speaking, slurred speech, weakness.

## 2020-02-07 NOTE — Consult Note (Signed)
Neurology Consultation Reason for Consult: Facial droop Referring Physician: Sedonia Small, M  CC: Dizziness  History is obtained from: Patient  HPI: Michelle Johnston is a 69 y.o. female the history of atrial fibrillation on Xarelto who is being evaluated in cardiology clinic and given flecainide around 10 AM.  Around 11 AM, she started complaining of significant dizziness.  On exam, it was noticed that she had right facial droop and she was brought in emergently as a code stroke.  On arrival, she had an isolated right facial droop.  She was taken for emergent CT angiogram which was negative.   LKW: 10 AM tpa given?: no, anticoagulation   ROS: A 14 point ROS was performed and is negative except as noted in the HPI.  Past Medical History:  Diagnosis Date  . Arthritis   . Bradycardia   . Bronchitis   . Depression   . Glaucoma   . Hypertension   . Seasonal allergies   . Wears glasses      Family History  Problem Relation Age of Onset  . Hypertension Mother   . Hyperlipidemia Mother   . Lymphoma Mother   . Lung cancer Father      Social History:  reports that she quit smoking about 21 years ago. Her smoking use included cigarettes. She has a 60.00 pack-year smoking history. She has never used smokeless tobacco. She reports current alcohol use. She reports that she does not use drugs.   Exam: Current vital signs: There were no vitals taken for this visit. Vital signs in last 24 hours: Pulse Rate:  [84-104] 84 (12/08 1121) BP: (158-167)/(92-106) 165/97 (12/08 1121) SpO2:  [98 %] 98 % (12/08 1121)   Physical Exam  Constitutional: Appears well-developed and well-nourished.  Psych: Affect appropriate to situation Eyes: No scleral injection HENT: No OP obstrucion MSK: no joint deformities.  Cardiovascular: Normal rate and regular rhythm.  Respiratory: Effort normal, non-labored breathing GI: Soft.  No distension. There is no tenderness.  Skin: WDI  Neuro: Mental Status: Patient  is awake, alert, oriented to person, place, month, year, and situation. Patient is able to give a clear and coherent history. No signs of aphasia or neglect Cranial Nerves: II: Visual Fields are full. Pupils are equal, round, and reactive to light.   III,IV, VI: EOMI without ptosis or diploplia.  V: Facial sensation is symmetric to temperature VII: Facial movement with right facial weakness VIII: hearing is intact to voice X: Uvula elevates symmetrically XI: Shoulder shrug is symmetric. XII: tongue is midline without atrophy or fasciculations.  Motor: Tone is normal. Bulk is normal. 5/5 strength was present in all four extremities.  Sensory: Sensation is symmetric to light touch and temperature in the arms and legs. Cerebellar: FNF and HKS are intact bilaterally   I have reviewed labs in epic and the results pertinent to this consultation are: CMP-unremarkable  I have reviewed the images obtained: CT/CTA-negative  Impression: 69 year old female with acute onset dizziness in the setting of flecainide administration.  Looking at her photo in epic, I suspect that her mild right facial asymmetry might actually be longstanding.  With a negative MRI, I suspect that this represented an adverse side effect of flecainide rather than TIA or stroke.  Recommendations: 1) MRI initially recommended, available at the time of finalizing this note. 2) no further recommendations from neurology at this time, defer decisions on flecainide to cardiology.  Roland Rack, MD [facility] [phone]  If 7pm- 7am, please page neurology on  call as listed in [place].

## 2020-02-07 NOTE — Code Documentation (Signed)
Stroke Response Nurse Documentation Code Documentation  Michelle Johnston is a 69 y.o. female arriving to [place] ED via [facility] on 12/8 with past medical hx of Afib. Code stroke was activated by EMS after the Cardiology office called. Patient from Cardiology office where she was LKW at 1120. Pt reports getting Flecainide at 1000 for her Afib. At 1120, she reported a sudden onset of dizziness and staff noted right facial droop. On Xarelto (rivaroxaban) daily.  Stroke team at the bedside on patient arrival. Labs drawn and patient cleared for CT by Dr. Sedonia Small. Patient to CT with team. NIHSS 1, see documentation for details and code stroke times. Patient with right facial droop on exam. The following imaging was completed:  CT, CTA head and neck. Patient is not a candidate for tPA due to being on Xarelto. Care/Plan: q2 hour mNIHSS/VS. Bedside handoff with ED RN Santiago Glad.    Kathrin Greathouse  Stroke Response RN

## 2020-02-07 NOTE — ED Notes (Signed)
Pt to MRI

## 2020-02-07 NOTE — Progress Notes (Addendum)
69 y.o. Caucasian female with hypertension, mod AI, mod MR, symptomatic paroxysmal Afib, h/o tobacco dependance, depression.  Patient is here for drug monitoring for initiation of flecainide for atrial fibrillation, received first dose at 10:00 AM.  At around 11:20 AM, patient started complaining of dizziness and blurry vision, along with left upper arm tingling numbness. Patient is on Xarelto 5 mg daily in the last 2021.   Vitals:   02/07/20 1120 02/07/20 1121  BP: (!) 158/92 (!) 165/97  Pulse: 88 84  SpO2: 98% 98%    Cardiovascular studies:  EKG 02/07/2020: Atrial fibrillation 94 bpm  Left anterior fascicular block  Echocardiogram 02/06/2020:  Left ventricle cavity is normal in size. Mild concentric hypertrophy of  the left ventricle. Normal LV systolic function with visual EF 50-55%.  Normal global wall motion. Unable to evaluate diastolic function due to  atrial fibrillation.  Left atrial cavity is severely dilated.  Right atrial cavity is mildly dilated.  Trileaflet aortic valve. Moderate (Grade III) aortic regurgitation.  Moderate (Grade III) mitral regurgitation.  Moderate tricuspid regurgitation. Estimated pulmonary artery systolic  pressure 31 mmHg.  Mild pulmonic regurgitation.  No significant change compared to previous study on 03/07/2019.  CT cardiac scoring 01/11/2020: 1. Calcium score of 0. This is between 0 and 25th percentile for females between the ages of 87 and 52. This corresponds to no identifiable plaque. There is very low, generally less than 5% risk of coronary artery disease    Lexiscan (Walking with mod Bruce)Tetrofosmin Stress Test  08/08/2019: Nondiagnostic ECG stress. Underlying A. Fibrillation. Myocardial perfusion is normal. Overall LV systolic function is abnormal without regional wall motion abnormalities. Stress LV EF: 43%.  Findings may represent non ischemic cardiomyopathy or LVEF may be underestimated due to difficulty in gating due to  underlying A. Fibrillation. Correlate with echocardiogram. Low risk study.  No previous exam available for comparison.   Current Outpatient Medications on File Prior to Visit  Medication Sig Dispense Refill  . acetaminophen (TYLENOL) 500 MG tablet Take 1,000 mg by mouth every 6 (six) hours as needed. pain    . albuterol (VENTOLIN HFA) 108 (90 Base) MCG/ACT inhaler Inhale 1-2 puffs into the lungs every 6 (six) hours as needed for wheezing or shortness of breath.    Marland Kitchen amLODipine (NORVASC) 5 MG tablet Take 5 mg by mouth at bedtime.     Marland Kitchen b complex vitamins tablet Take 1 tablet by mouth daily.    Marland Kitchen buPROPion (WELLBUTRIN XL) 150 MG 24 hr tablet Take 1 tablet by mouth daily.    . calcium carbonate (OS-CAL - DOSED IN MG OF ELEMENTAL CALCIUM) 1250 (500 Ca) MG tablet Take 1 tablet by mouth daily with breakfast.    . cetirizine (ZYRTEC) 10 MG tablet Take 10 mg by mouth at bedtime.    . flecainide (TAMBOCOR) 50 MG tablet Take 1 tablet (50 mg total) by mouth 2 (two) times daily. 60 tablet 3  . Fluticasone-Salmeterol (ADVAIR) 250-50 MCG/DOSE AEPB Inhale 1 puff into the lungs 2 (two) times daily as needed (asthma).    . ipratropium (ATROVENT) 0.06 % nasal spray as needed.    . latanoprost (XALATAN) 0.005 % ophthalmic solution Place 1 drop into both eyes every morning.     . meloxicam (MOBIC) 15 MG tablet as needed.    . metoprolol tartrate (LOPRESSOR) 25 MG tablet Take 1 tablet (25 mg total) by mouth 2 (two) times daily. 180 tablet 3  . montelukast (SINGULAIR) 10 MG tablet Take 10 mg  by mouth at bedtime.    . Multiple Vitamins-Minerals (MULTIVITAMIN WITH MINERALS) tablet Take 1 tablet by mouth daily.    . nitroGLYCERIN (NITROSTAT) 0.4 MG SL tablet Place 1 tablet (0.4 mg total) under the tongue every 5 (five) minutes as needed. 30 tablet 3  . Omega-3 Fatty Acids (FISH OIL) 1000 MG CAPS Take 1,000 mg by mouth daily.    Marland Kitchen omeprazole (PRILOSEC) 20 MG capsule as needed.    . Probiotic Product (PROBIOTIC DAILY  PO) Take 1 capsule by mouth daily.    . rivaroxaban (XARELTO) 20 MG TABS tablet Take 1 tablet (20 mg total) by mouth daily with supper. 90 tablet 2  . Turmeric 500 MG CAPS Take 500 mg by mouth daily.    . vitamin C (ASCORBIC ACID) 500 MG tablet Take 500 mg by mouth daily.     No current facility-administered medications on file prior to visit.   Physical Exam Vitals and nursing note reviewed.  Constitutional:      General: She is not in acute distress. Neck:     Vascular: No JVD.  Cardiovascular:     Rate and Rhythm: Normal rate and regular rhythm.     Heart sounds: Normal heart sounds. No murmur heard.   Pulmonary:     Effort: Pulmonary effort is normal.     Breath sounds: Normal breath sounds. No wheezing or rales.  Neurological:     Cranial Nerves: Cranial nerve deficit (Right facial droop) and facial asymmetry present.     Motor: Motor function is intact.     Coordination: Coordination is intact.     A/P: On my exam, there is no motor deficit, but there is right facial droop. Very unlikely that it is related to flecainide. Concern regarding TIA/stroke.  Recommend emergent ED evaluation.  EMS called.  Patient's husband notified.   Nigel Mormon, MD Pager: [phone] Office: [phone]

## 2020-02-07 NOTE — ED Notes (Signed)
Patient Alert and oriented to baseline. Stable and ambulatory to baseline. Patient verbalized understanding of the discharge instructions.  Patient belongings were taken by the patient. No deficits noted at time of discharge.

## 2020-02-07 NOTE — ED Provider Notes (Signed)
[facility] Emergency Department Provider Note MRN:  [id number]  Arrival date & time: 02/07/20     Chief Complaint   Code stroke History of Present Illness   Michelle Johnston is a 69 y.o. year-old female with a history of A. fib presenting to the ED with chief complaint of code stroke.  Last known well 11:20 AM.  Patient was at the cardiovascular office receiving her first dose of flecainide.  At 1120 she noted some facial droop and was beginning to feel trouble speaking or forming words.  Code stroke initiation prior to arrival.  Review of Systems  A complete 10 system review of systems was obtained and all systems are negative except as noted in the HPI and PMH.   Patient's Health History    Past Medical History:  Diagnosis Date  . Arthritis   . Bradycardia   . Bronchitis   . Depression   . Glaucoma   . Hypertension   . Seasonal allergies   . Wears glasses     Past Surgical History:  Procedure Laterality Date  . ABDOMINAL HYSTERECTOMY  1990  . BREAST BIOPSY Right    benign  . CARDIOVERSION N/A 08/15/2019   Procedure: CARDIOVERSION;  Surgeon: Nigel Mormon, MD;  [facility];  Service: Cardiovascular;  Laterality: N/A;  . COLONOSCOPY    . meniscus tear knee     left knee  . NASAL SINUS SURGERY  1975  . ORIF ANKLE FRACTURE  2009   left  . TOTAL KNEE ARTHROPLASTY Left 02/03/2019   Procedure: LEFT TOTAL KNEE ARTHROPLASTY;  Surgeon: Mcarthur Rossetti, MD;  Location: [facility];  Service: Orthopedics;  Laterality: Left;  . TURBINATE REDUCTION Bilateral 06/04/2014   Procedure: BILATERAL TURBINATE REDUCTION;  Surgeon: Leta Baptist, MD;  Location: [place];  Service: ENT;  Laterality: Bilateral;  . TYMPANOSTOMY TUBE PLACEMENT     left    Family History  Problem Relation Age of Onset  . Hypertension Mother   . Hyperlipidemia Mother   . Lymphoma Mother   . Lung cancer Father     Social History   Socioeconomic History  .  Marital status: Married    Spouse name: Not on file  . Number of children: 0  . Years of education: Not on file  . Highest education level: Not on file  Occupational History  . Not on file  Tobacco Use  . Smoking status: Former Smoker    Packs/day: 2.00    Years: 30.00    Pack years: 60.00    Types: Cigarettes    Quit date: 05/28/1998    Years since quitting: 21.7  . Smokeless tobacco: Never Used  Vaping Use  . Vaping Use: Never used  Substance and Sexual Activity  . Alcohol use: Yes    Comment: daily-wine daily-1 glass  . Drug use: No  . Sexual activity: Not on file  Other Topics Concern  . Not on file  Social History Narrative  . Not on file   Social Determinants of Health   Financial Resource Strain:   . Difficulty of Paying Living Expenses: Not on file  Food Insecurity:   . Worried About Charity fundraiser in the Last Year: Not on file  . Ran Out of Food in the Last Year: Not on file  Transportation Needs:   . Lack of Transportation (Medical): Not on file  . Lack of Transportation (Non-Medical): Not on file  Physical Activity:   .  Days of Exercise per Week: Not on file  . Minutes of Exercise per Session: Not on file  Stress:   . Feeling of Stress : Not on file  Social Connections:   . Frequency of Communication with Friends and Family: Not on file  . Frequency of Social Gatherings with Friends and Family: Not on file  . Attends Religious Services: Not on file  . Active Member of Clubs or Organizations: Not on file  . Attends Archivist Meetings: Not on file  . Marital Status: Not on file  Intimate Partner Violence:   . Fear of Current or Ex-Partner: Not on file  . Emotionally Abused: Not on file  . Physically Abused: Not on file  . Sexually Abused: Not on file     Physical Exam   Vitals:   02/07/20 1300 02/07/20 1503  BP: (!) 147/87 (!) 158/107  Pulse: (!) 136 97  Resp: 19 (!) 22  Temp:  98.8 F (37.1 C)  SpO2: 97% 97%     CONSTITUTIONAL: Well-appearing, NAD NEURO:  Alert and oriented x 3, intermittent twitching to the left cheek, otherwise normal and symmetric strength and sensation EYES:  eyes equal and reactive ENT/NECK:  no LAD, no JVD CARDIO: Regular rate, well-perfused, normal S1 and S2 PULM:  CTAB no wheezing or rhonchi GI/GU:  normal bowel sounds, non-distended, non-tender MSK/SPINE:  No gross deformities, no edema SKIN:  no rash, atraumatic PSYCH:  Appropriate speech and behavior  *Additional and/or pertinent findings included in MDM below  Diagnostic and Interventional Summary    EKG Interpretation  Date/Time:  Wednesday February 07 2020 12:41:56 EST Ventricular Rate:  99 PR Interval:    QRS Duration: 115 QT Interval:  388 QTC Calculation: 498 R Axis:   -40 Text Interpretation: Atrial fibrillation Incomplete RBBB and LAFB Probable anterior infarct, age indeterminate Confirmed by Gerlene Fee [phone]) on 02/07/2020 1:16:30 PM      Labs Reviewed  APTT - Abnormal; Notable for the following components:      Result Value   aPTT 41 (*)    All other components within normal limits  CBC - Abnormal; Notable for the following components:   MCV 103.1 (*)    MCH 34.4 (*)    All other components within normal limits  COMPREHENSIVE METABOLIC PANEL - Abnormal; Notable for the following components:   Glucose, Bld 118 (*)    All other components within normal limits  CBG MONITORING, ED - Abnormal; Notable for the following components:   Glucose-Capillary 116 (*)    All other components within normal limits  I-STAT CHEM 8, ED - Abnormal; Notable for the following components:   Glucose, Bld 116 (*)    All other components within normal limits  RESP PANEL BY RT-PCR (FLU A&B, COVID) ARPGX2  PROTIME-INR  DIFFERENTIAL  CBG MONITORING, ED  TROPONIN I (HIGH SENSITIVITY)  TROPONIN I (HIGH SENSITIVITY)    MR BRAIN WO CONTRAST  Final Result    CT Code Stroke CTA Head W/WO contrast  Final Result     CT Code Stroke CTA Neck W/WO contrast  Final Result    CT HEAD CODE STROKE WO CONTRAST  Final Result    DG Chest Port 1 View    (Results Pending)    Medications  sodium chloride flush (NS) 0.9 % injection 3 mL (has no administration in time range)  iohexol (OMNIPAQUE) 350 MG/ML injection 75 mL (75 mLs Intravenous Contrast Given 02/07/20 1231)     Procedures  /  Critical Care .Critical Care Performed by: Maudie Flakes, MD Authorized by: Maudie Flakes, MD   Critical care provider statement:    Critical care time (minutes):  35   Critical care was necessary to treat or prevent imminent or life-threatening deterioration of the following conditions: Concern for acute ischemic stroke, initiation of code stroke protocol.   Critical care was time spent personally by me on the following activities:  Discussions with consultants, evaluation of patient's response to treatment, examination of patient, ordering and performing treatments and interventions, ordering and review of laboratory studies, ordering and review of radiographic studies, pulse oximetry, re-evaluation of patient's condition, obtaining history from patient or surrogate and review of old charts    ED Course and Medical Decision Making  I have reviewed the triage vital signs, the nursing notes, and pertinent available records from the EMR.  Listed above are laboratory and imaging tests that I personally ordered, reviewed, and interpreted and then considered in my medical decision making (see below for details).  Concern for acute ischemic stroke, awaiting neurology recommendations and CT imaging results.     Patient has a reassuring neurological exam, does have some twitching to the left side of her face which is of unclear significance.  MRI is negative for acute stroke.  Patient does endorse having some atypical chest discomfort during this event in the cardiovascular office.  Will obtain 2 troponins.  Otherwise EKG is  reassuring and patient would be appropriate for discharge if troponins are negative.  Signed out to oncoming provider at shift change.  Barth Kirks. [place] Small, [place] [email or web address]  Final Clinical Impressions(s) / ED Diagnoses     ICD-10-CM   1. Chest pain, unspecified type  R07.9     ED Discharge Orders    None       Discharge Instructions Discussed with and Provided to Patient:   Discharge Instructions   None       Maudie Flakes, MD 02/07/20 1557

## 2020-02-08 ENCOUNTER — Telehealth: Payer: Self-pay | Admitting: Cardiology

## 2020-02-08 DIAGNOSIS — I4819 Other persistent atrial fibrillation: Secondary | ICD-10-CM

## 2020-02-08 NOTE — Telephone Encounter (Signed)
I called the patient to recheck on her given her recent ED visit yesterday. I left a voicemail. Stroke work-up was essentially negative. MRI negative TIA still possible. I will hold off rhythm control therapy for next couple weeks. I will see her in a few weeks to reconsider alternate antiarrhythmic therapy.

## 2020-02-12 ENCOUNTER — Ambulatory Visit: Payer: Medicare HMO | Admitting: Cardiology

## 2020-02-21 DIAGNOSIS — H401332 Pigmentary glaucoma, bilateral, moderate stage: Secondary | ICD-10-CM | POA: Diagnosis not present

## 2020-02-28 ENCOUNTER — Ambulatory Visit: Payer: Medicare HMO

## 2020-03-04 NOTE — Progress Notes (Signed)
Cardiology Office Note:    Date:  03/08/2020   ID:  Michelle Johnston, DOB Sep 16, 1950, MRN [id number]  PCP:  Michelle Neer, MD  Cardiologist:  No primary care provider on file.  Electrophysiologist:  None   Referring MD: Michelle Neer, MD   Chief Complaint  Patient presents with  . Atrial Fibrillation    History of Present Illness:    Michelle Johnston is a 70 y.o. female with a hx of paroxysmal atrial fibrillation, moderate MR, moderate AI, tobacco use who is referred by Dr. Brigitte Johnston for evaluation of atrial fibrillation.  Previously followed with Dr. Virgina Johnston in cardiology, last seen 02/07/2020.  She was started on flecainide for A. fib on 12/8, subsequently developed dizziness and blurry vision along with left upper arm numbness.  She was sent to the ED for concern for TIA/CVA.  MRI negative for stroke.  Neurology was consulted, thought that symptoms likely are due to adverse reaction to flecainide as opposed to TIA.  She had successful cardioversion on 08/15/2019 but did not sustain sinus rhythm, was back in A. fib at follow-up visit on 08/24/2019.  Echocardiogram on 03/12/2019 showed EF 55 to 60%, mild LVH, grade 1 diastolic dysfunction, moderate AI, moderate MR.  Lexiscan Myoview on 08/07/2019 showed normal perfusion, EF 43%.  ABIs on 01/02/2020 were mildly reduced in RLE (0.92) and normal in LLE (1.0). repeat echocardiogram 02/06/2020 showed EF 50 to 55%, moderate AI, moderate MR, moderate TR, severe left atrial dilatation, mild right atrial dilatation.    She reports that she has been having dyspnea with minimal exertion.  States that this has been a change since he went into A. fib.  Does report occasional chest pain.  She denies any lightheadedness or syncope.  Reports occasional lower extremity edema.  Does report she been having pain in her legs when she walks.  She has been taking Xarelto every day, denies any bleeding issues.  She quit smoking years ago.    Past Medical History:  Diagnosis Date   . Arthritis   . Bradycardia   . Bronchitis   . Depression   . Glaucoma   . Hypertension   . Seasonal allergies   . Wears glasses     Past Surgical History:  Procedure Laterality Date  . ABDOMINAL HYSTERECTOMY  1990  . BREAST BIOPSY Right    benign  . CARDIOVERSION N/A 08/15/2019   Procedure: CARDIOVERSION;  Surgeon: Nigel Mormon, MD;  [facility];  Service: Cardiovascular;  Laterality: N/A;  . COLONOSCOPY    . meniscus tear knee     left knee  . NASAL SINUS SURGERY  1975  . ORIF ANKLE FRACTURE  2009   left  . TOTAL KNEE ARTHROPLASTY Left 02/03/2019   Procedure: LEFT TOTAL KNEE ARTHROPLASTY;  Surgeon: Mcarthur Rossetti, MD;  Location: [facility];  Service: Orthopedics;  Laterality: Left;  . TURBINATE REDUCTION Bilateral 06/04/2014   Procedure: BILATERAL TURBINATE REDUCTION;  Surgeon: Leta Baptist, MD;  Location: [place];  Service: ENT;  Laterality: Bilateral;  . TYMPANOSTOMY TUBE PLACEMENT     left    Current Medications: Current Meds  Medication Sig  . acetaminophen (TYLENOL) 500 MG tablet Take 1,000 mg by mouth every 6 (six) hours as needed. pain  . albuterol (VENTOLIN HFA) 108 (90 Base) MCG/ACT inhaler Inhale 1-2 puffs into the lungs every 6 (six) hours as needed for wheezing or shortness of breath.  Marland Kitchen amLODipine (NORVASC) 5 MG tablet Take 5 mg by  mouth at bedtime.   Marland Kitchen b complex vitamins tablet Take 1 tablet by mouth daily.  Marland Kitchen buPROPion (WELLBUTRIN XL) 150 MG 24 hr tablet Take 1 tablet by mouth daily.  . calcium carbonate (OS-CAL - DOSED IN MG OF ELEMENTAL CALCIUM) 1250 (500 Ca) MG tablet Take 1 tablet by mouth daily with breakfast.  . cetirizine (ZYRTEC) 10 MG tablet Take 10 mg by mouth at bedtime.  . Cholecalciferol (VITAMIN D) 50 MCG (2000 UT) tablet 1 tablet  . FLUoxetine (PROZAC) 40 MG capsule   . Fluticasone-Salmeterol (ADVAIR) 250-50 MCG/DOSE AEPB Inhale 1 puff into the lungs 2 (two) times daily as needed (asthma).  . guaiFENesin  (MUCINEX) 600 MG 12 hr tablet 1 tablet for chest congestion  . ipratropium (ATROVENT) 0.06 % nasal spray as needed.  . latanoprost (XALATAN) 0.005 % ophthalmic solution Place 1 drop into both eyes every morning.   . meloxicam (MOBIC) 15 MG tablet as needed.  . metoprolol tartrate (LOPRESSOR) 25 MG tablet Take 1 tablet (25 mg total) by mouth 2 (two) times daily.  . montelukast (SINGULAIR) 10 MG tablet Take 10 mg by mouth at bedtime.  . Multiple Vitamins-Minerals (MULTIVITAMIN WITH MINERALS) tablet Take 1 tablet by mouth daily.  . nitroGLYCERIN (NITROSTAT) 0.4 MG SL tablet Place 1 tablet (0.4 mg total) under the tongue every 5 (five) minutes as needed.  . Omega-3 Fatty Acids (FISH OIL) 1000 MG CAPS Take 1,000 mg by mouth daily.  Marland Kitchen omeprazole (PRILOSEC) 20 MG capsule as needed.  . Probiotic Product (PROBIOTIC DAILY PO) Take 1 capsule by mouth daily.  . Turmeric 500 MG CAPS Take 500 mg by mouth daily.  . vitamin C (ASCORBIC ACID) 500 MG tablet Take 500 mg by mouth daily.  . [DISCONTINUED] rivaroxaban (XARELTO) 20 MG TABS tablet Take 1 tablet (20 mg total) by mouth daily with supper.  . [DISCONTINUED] rosuvastatin (CRESTOR) 10 MG tablet Take 1 tablet (10 mg total) by mouth daily.     Allergies:   Dexlansoprazole, Flecainide, Ceclor [cefaclor], Ciprofloxacin, and Flagyl [metronidazole]   Social History   Socioeconomic History  . Marital status: Married    Spouse name: Not on file  . Number of children: 0  . Years of education: Not on file  . Highest education level: Not on file  Occupational History  . Not on file  Tobacco Use  . Smoking status: Former Smoker    Packs/day: 2.00    Years: 30.00    Pack years: 60.00    Types: Cigarettes    Quit date: 05/28/1998    Years since quitting: 21.7  . Smokeless tobacco: Never Used  Vaping Use  . Vaping Use: Never used  Substance and Sexual Activity  . Alcohol use: Yes    Comment: daily-wine daily-1 glass  . Drug use: No  . Sexual activity:  Not on file  Other Topics Concern  . Not on file  Social History Narrative  . Not on file   Social Determinants of Health   Financial Resource Strain: Not on file  Food Insecurity: Not on file  Transportation Needs: Not on file  Physical Activity: Not on file  Stress: Not on file  Social Connections: Not on file     Family History: The patient's family history includes Hyperlipidemia in her mother; Hypertension in her mother; Lung cancer in her father; Lymphoma in her mother.  ROS:   Please see the history of present illness.     All other systems reviewed and are negative.  EKGs/Labs/Other Studies Reviewed:    The following studies were reviewed today:   EKG:  EKG is ordered today.  The ekg ordered today demonstrates atrial fibrillation, rate 85, incomplete right bundle branch block  Recent Labs: 02/07/2020: ALT 21; BUN 18; Creatinine, Ser 0.60; Hemoglobin 14.6; Platelets 265; Potassium 3.9; Sodium 135  Recent Lipid Panel No results found for: CHOL, TRIG, HDL, CHOLHDL, VLDL, LDLCALC, LDLDIRECT  Physical Exam:    VS:  BP 124/74 (BP Location: Left Arm)   Johnston 84   Ht 5\' 5"  (1.651 m)   Wt 154 lb 3.2 oz (69.9 kg)   BMI 25.66 kg/m     Wt Readings from Last 3 Encounters:  03/08/20 154 lb 3.2 oz (69.9 kg)  02/07/20 155 lb (70.3 kg)  01/31/20 154 lb (69.9 kg)     GEN:  in no acute distress HEENT: Normal NECK: No JVD; No carotid bruits LYMPHATICS: No lymphadenopathy CARDIAC: Normal rate, irregular rhythm, 2 out of 6 systolic murmur RESPIRATORY:  Clear to auscultation without rales, wheezing or rhonchi  ABDOMEN: Soft, non-tender, non-distended MUSCULOSKELETAL:  No edema; No deformity  SKIN: Warm and dry NEUROLOGIC:  Alert and oriented x 3 PSYCHIATRIC:  Normal affect   ASSESSMENT:    1. Mitral valve insufficiency, unspecified etiology   2. Persistent atrial fibrillation (HCC)   3. Aortic valve insufficiency, etiology of cardiac valve disease unspecified   4.  Essential hypertension   5. PAD (peripheral artery disease) (HCC)   6. Hyperlipidemia, unspecified hyperlipidemia type    PLAN:    Atrial fibrillation: Persistent. CHA2DS2-VASc score 3 (hypertension, age, female).  On Xarelto 20 mg daily.  Successful cardioversion in June but went back into A. fib.  Was tried on flecainide last month but had adverse reaction. -Continue Xarelto -Continue metoprolol 25 mg twice daily, appears rate controlled -Given she appears to be symptomatic despite adequate rate control, would favor attempt at restoring sinus rhythm.  She has failed flecainide due to adverse reaction.  Discussed alternative antiarrhythmic versus ablation.  Will refer to A. fib clinic for further evaluation  Mtral regurgitation: Moderate by report on echo 02/06/2020.  Will obtain images from recent echo  Aortic regurgitation: Moderate by report on echo 02/06/2020.  Will obtain images from recent echo  Hypertension: On amlodipine 5 mg daily and metoprolol 25 mg twice daily.  Appears controlled  PAD: Mild PAD RLE (ABI 0.92), normal in LLE (ABI 1.0 ).  On Xarelto.  We will start rosuvastatin 10 mg daily.  Hyperlipidemia: LDL 122 on 12/14/2019.  Not currently on statin.  Start rosuvastatin 10 mg daily as above  RTC in 6 months  Medication Adjustments/Labs and Tests Ordered: Current medicines are reviewed at length with the patient today.  Concerns regarding medicines are outlined above.  Orders Placed This Encounter  Procedures  . Amb Referral to AFIB Clinic  . EKG 12-Lead  . ECHOCARDIOGRAM COMPLETE   Meds ordered this encounter  Medications  . DISCONTD: rivaroxaban (XARELTO) 20 MG TABS tablet    Sig: Take 1 tablet (20 mg total) by mouth daily with supper.    Dispense:  90 tablet    Refill:  3  . DISCONTD: rosuvastatin (CRESTOR) 10 MG tablet    Sig: Take 1 tablet (10 mg total) by mouth daily.    Dispense:  90 tablet    Refill:  3    Patient Instructions  Medication  Instructions:  START rosuvastatin (Crestor) 10 mg daily  *If you need a refill on your  cardiac medications before your next appointment, please call your pharmacy*   Testing/Procedures: Your physician has requested that you have an echocardiogram in 6 MONTHS (July 2022). Echocardiography is a painless test that uses sound waves to create images of your heart. It provides your doctor with information about the size and shape of your heart and how well your heart's chambers and valves are working. This procedure takes approximately one hour. There are no restrictions for this procedure.  This will be done at our [facility] location:  [place]: At [facility], you and your health needs are our priority.  As part of our continuing mission to provide you with exceptional heart care, we have created designated Provider Care Teams.  These Care Teams include your primary Cardiologist (physician) and Advanced Practice Providers (APPs -  Physician Assistants and Nurse Practitioners) who all work together to provide you with the care you need, when you need it.  We recommend signing up for the patient portal called "MyChart".  Sign up information is provided on this After Visit Summary.  MyChart is used to connect with patients for Virtual Visits (Telemedicine).  Patients are able to view lab/test results, encounter notes, upcoming appointments, etc.  Non-urgent messages can be sent to your provider as well.   To learn more about what you can do with MyChart, go to [email or web address].    Your next appointment:   6 month(s)  The format for your next appointment:   In Person  Provider:   Oswaldo Milian, MD   Other Instructions You have been referred to: Atrial Fibrillation Clinic at [place], Donato Heinz, MD  03/08/2020 1:26 PM    [place]

## 2020-03-06 ENCOUNTER — Ambulatory Visit: Payer: Medicare HMO | Admitting: Cardiology

## 2020-03-07 ENCOUNTER — Ambulatory Visit: Payer: Medicare HMO | Admitting: Cardiology

## 2020-03-08 ENCOUNTER — Other Ambulatory Visit: Payer: Self-pay

## 2020-03-08 ENCOUNTER — Encounter: Payer: Self-pay | Admitting: Cardiology

## 2020-03-08 ENCOUNTER — Other Ambulatory Visit: Payer: Self-pay | Admitting: *Deleted

## 2020-03-08 ENCOUNTER — Ambulatory Visit: Payer: Medicare HMO | Admitting: Cardiology

## 2020-03-08 VITALS — BP 124/74 | HR 84 | Ht 65.0 in | Wt 154.2 lb

## 2020-03-08 DIAGNOSIS — I739 Peripheral vascular disease, unspecified: Secondary | ICD-10-CM | POA: Diagnosis not present

## 2020-03-08 DIAGNOSIS — E785 Hyperlipidemia, unspecified: Secondary | ICD-10-CM | POA: Diagnosis not present

## 2020-03-08 DIAGNOSIS — I351 Nonrheumatic aortic (valve) insufficiency: Secondary | ICD-10-CM

## 2020-03-08 DIAGNOSIS — I10 Essential (primary) hypertension: Secondary | ICD-10-CM | POA: Diagnosis not present

## 2020-03-08 DIAGNOSIS — I4819 Other persistent atrial fibrillation: Secondary | ICD-10-CM

## 2020-03-08 DIAGNOSIS — I340 Nonrheumatic mitral (valve) insufficiency: Secondary | ICD-10-CM | POA: Diagnosis not present

## 2020-03-08 DIAGNOSIS — I480 Paroxysmal atrial fibrillation: Secondary | ICD-10-CM

## 2020-03-08 MED ORDER — ROSUVASTATIN CALCIUM 10 MG PO TABS: 10.0000 mg | ORAL_TABLET | Freq: Every day | ORAL | 3 refills | Status: DC

## 2020-03-08 MED ORDER — RIVAROXABAN 20 MG PO TABS: 20.0000 mg | ORAL_TABLET | Freq: Every day | ORAL | 3 refills | Status: DC

## 2020-03-08 NOTE — Patient Instructions (Addendum)
Medication Instructions:  START rosuvastatin (Crestor) 10 mg daily  *If you need a refill on your cardiac medications before your next appointment, please call your pharmacy*   Testing/Procedures: Your physician has requested that you have an echocardiogram in 6 MONTHS (July 2022). Echocardiography is a painless test that uses sound waves to create images of your heart. It provides your doctor with information about the size and shape of your heart and how well your heart's chambers and valves are working. This procedure takes approximately one hour. There are no restrictions for this procedure.  This will be done at our [facility] location:  [place]: At [facility], you and your health needs are our priority.  As part of our continuing mission to provide you with exceptional heart care, we have created designated Provider Care Teams.  These Care Teams include your primary Cardiologist (physician) and Advanced Practice Providers (APPs -  Physician Assistants and Nurse Practitioners) who all work together to provide you with the care you need, when you need it.  We recommend signing up for the patient portal called "MyChart".  Sign up information is provided on this After Visit Summary.  MyChart is used to connect with patients for Virtual Visits (Telemedicine).  Patients are able to view lab/test results, encounter notes, upcoming appointments, etc.  Non-urgent messages can be sent to your provider as well.   To learn more about what you can do with MyChart, go to [email or web address].    Your next appointment:   6 month(s)  The format for your next appointment:   In Person  Provider:   Oswaldo Milian, MD   Other Instructions You have been referred to: Atrial Fibrillation Clinic at [facility]

## 2020-03-14 ENCOUNTER — Ambulatory Visit (INDEPENDENT_AMBULATORY_CARE_PROVIDER_SITE_OTHER): Payer: Medicare HMO | Admitting: Physician Assistant

## 2020-03-14 ENCOUNTER — Encounter: Payer: Self-pay | Admitting: Physician Assistant

## 2020-03-14 DIAGNOSIS — M1711 Unilateral primary osteoarthritis, right knee: Secondary | ICD-10-CM | POA: Diagnosis not present

## 2020-03-14 MED ORDER — LIDOCAINE HCL 1 % IJ SOLN
3.0000 mL | INTRAMUSCULAR | Status: AC | PRN
  Administered 2020-03-14: 3 mL

## 2020-03-14 MED ORDER — METHYLPREDNISOLONE ACETATE 40 MG/ML IJ SUSP
40.0000 mg | INTRAMUSCULAR | Status: AC | PRN
  Administered 2020-03-14: 40 mg via INTRA_ARTICULAR

## 2020-03-14 NOTE — Progress Notes (Signed)
   Procedure Note  Patient: Michelle Johnston             Date of Birth: Mar 08, 1950           MRN: [id number]             Visit Date: 03/14/2020  Mrs. Shrake comes in today due to right knee pain.  She has known osteoarthritis in the knee.  She denies any recent injuries to the knee.  She does note some locking and painful popping at times.  Knee also feels like it has fluid on it.  Is been ongoing for several months she is just been dealing with pain.  She denies any fevers chills or recent vaccines.  Physical exam: Right knee positive for effusion.  No abnormal warmth or erythema.  She has crepitus with passive range of motion of the knee.  Global tenderness.  No instability valgus/varus stressing.  Procedures: Visit Diagnoses:  1. Primary osteoarthritis of right knee     Large Joint Inj: R knee on 03/14/2020 4:55 PM Indications: pain Details: 22 G 1.5 in needle, anterolateral approach  Arthrogram: No  Medications: 3 mL lidocaine 1 %; 40 mg methylPREDNISolone acetate 40 MG/ML Aspirate: 45 mL yellow Outcome: tolerated well, no immediate complications Procedure, treatment alternatives, risks and benefits explained, specific risks discussed. Consent was given by the patient. Immediately prior to procedure a time out was called to verify the correct patient, procedure, equipment, support staff and site/side marked as required. Patient was prepped and draped in the usual sterile fashion.     Plan we will see her back in 2 weeks to see how she is doing with regards to the knee status postinjection.  If she is doing really well she can call and cancel the appointment.  However if she does return due to mechanical symptoms pain her recurrent effusion like an AP and lateral view of her right knee as it has been over 6 months since we obtained radiographs of her knee.  Questions were encouraged and answered.  She knows to wait at least 3 months between cortisone injections.

## 2020-03-26 DIAGNOSIS — M4800 Spinal stenosis, site unspecified: Secondary | ICD-10-CM | POA: Diagnosis not present

## 2020-03-26 DIAGNOSIS — I4891 Unspecified atrial fibrillation: Secondary | ICD-10-CM | POA: Diagnosis not present

## 2020-03-26 DIAGNOSIS — F322 Major depressive disorder, single episode, severe without psychotic features: Secondary | ICD-10-CM | POA: Diagnosis not present

## 2020-03-26 DIAGNOSIS — J42 Unspecified chronic bronchitis: Secondary | ICD-10-CM | POA: Diagnosis not present

## 2020-03-26 DIAGNOSIS — R29898 Other symptoms and signs involving the musculoskeletal system: Secondary | ICD-10-CM | POA: Diagnosis not present

## 2020-03-27 ENCOUNTER — Other Ambulatory Visit: Payer: Self-pay

## 2020-03-27 ENCOUNTER — Encounter (HOSPITAL_COMMUNITY): Payer: Self-pay | Admitting: Physician Assistant

## 2020-03-27 ENCOUNTER — Ambulatory Visit (HOSPITAL_COMMUNITY)
Admission: RE | Admit: 2020-03-27 | Discharge: 2020-03-27 | Disposition: A | Discharge status: Home / Self Care | Payer: Medicare HMO | Source: Ambulatory Visit | Attending: Physician Assistant | Admitting: Physician Assistant

## 2020-03-27 ENCOUNTER — Telehealth: Payer: Self-pay | Admitting: Pharmacist

## 2020-03-27 VITALS — BP 118/78 | HR 75 | Ht 65.0 in | Wt 153.4 lb

## 2020-03-27 DIAGNOSIS — D6869 Other thrombophilia: Secondary | ICD-10-CM | POA: Insufficient documentation

## 2020-03-27 DIAGNOSIS — Z87891 Personal history of nicotine dependence: Secondary | ICD-10-CM | POA: Insufficient documentation

## 2020-03-27 DIAGNOSIS — Z8249 Family history of ischemic heart disease and other diseases of the circulatory system: Secondary | ICD-10-CM | POA: Insufficient documentation

## 2020-03-27 DIAGNOSIS — I4819 Other persistent atrial fibrillation: Secondary | ICD-10-CM | POA: Diagnosis not present

## 2020-03-27 DIAGNOSIS — Z7901 Long term (current) use of anticoagulants: Secondary | ICD-10-CM | POA: Diagnosis not present

## 2020-03-27 DIAGNOSIS — I38 Endocarditis, valve unspecified: Secondary | ICD-10-CM | POA: Diagnosis not present

## 2020-03-27 DIAGNOSIS — R400 Somnolence: Secondary | ICD-10-CM | POA: Insufficient documentation

## 2020-03-27 DIAGNOSIS — Z79899 Other long term (current) drug therapy: Secondary | ICD-10-CM | POA: Insufficient documentation

## 2020-03-27 DIAGNOSIS — I10 Essential (primary) hypertension: Secondary | ICD-10-CM | POA: Diagnosis not present

## 2020-03-27 DIAGNOSIS — R0683 Snoring: Secondary | ICD-10-CM | POA: Diagnosis not present

## 2020-03-27 LAB — BASIC METABOLIC PANEL
Anion gap: 10 (ref 5–15)
BUN: 18 mg/dL (ref 8–23)
CO2: 24 mmol/L (ref 22–32)
Calcium: 9.2 mg/dL (ref 8.9–10.3)
Chloride: 99 mmol/L (ref 98–111)
Creatinine, Ser: 0.82 mg/dL (ref 0.44–1.00)
GFR, Estimated: >60 mL/min (ref >60)
Glucose, Bld: 99 mg/dL (ref 70–99)
Potassium: 4.3 mmol/L (ref 3.5–5.1)
Sodium: 133 mmol/L — ABNORMAL LOW (ref 135–145)

## 2020-03-27 LAB — MAGNESIUM: Magnesium: 2 mg/dL (ref 1.7–2.4)

## 2020-03-27 NOTE — Telephone Encounter (Signed)
Medication list reviewed in anticipation of upcoming Tikosyn initiation. Patient is not taking any contraindicated medications. She is taking 2 QTc prolonging medications. Fluoxetine and Advair can both prolong the QTc. They are considered intermediate risk. Risk with inhaled medications is lower. Patient could discuss with prescribing physician about switching fluoxetine to another medications, such as duloxetine, that does not interact. If she chooses to stay on fluoxetine, close monitoring of QTc should be done.  Patient is anticoagulated on Xarelto on the appropriate dose. Please ensure that patient has not missed any anticoagulation doses in the 3 weeks prior to Tikosyn initiation.   Patient will need to be counseled to avoid use of Benadryl while on Tikosyn and in the 2-3 days prior to Tikosyn initiation.

## 2020-03-27 NOTE — Progress Notes (Signed)
Primary Care Physician: Mayra Neer, MD Primary Cardiologist: Dr Gardiner Rhyme Primary Electrophysiologist: none Referring Physician: Dr Koleen Nimrod Michelle Johnston is a 70 y.o. female with a history of atrial fibrillation, HTN, PAD, moderate MR, moderate AI, tobacco use who presents for consultation in the [facility]. She underwent DCCV on 08/15/19 but was back in afib on follow up one week later. Patient was started on flecainide but developed dizziness and vision blurring and this was discontinued. Patient is on Xarelto for a CHADS2VASC score of 3. She has symptoms of fatigue and dyspnea with exertion while she is in afib. She does admit to snoring and daytime somnolence.   Today, she denies symptoms of palpitations, chest pain, orthopnea, PND, lower extremity edema, dizziness, presyncope, syncope, bleeding, or neurologic sequela. The patient is tolerating medications without difficulties and is otherwise without complaint today.    Atrial Fibrillation Risk Factors:  she does have symptoms or diagnosis of sleep apnea. she does have a history of rheumatic fever. she does not have a history of alcohol use. The patient does not have a history of early familial atrial fibrillation or other arrhythmias.  she has a BMI of Body mass index is 25.53 kg/m.Marland Kitchen Filed Weights   03/27/20 1351  Weight: 69.6 kg    Family History  Problem Relation Age of Onset  . Hypertension Mother   . Hyperlipidemia Mother   . Lymphoma Mother   . Lung cancer Father      Atrial Fibrillation Management history:  Previous antiarrhythmic drugs: flecainide Previous cardioversions: 08/17/19 Previous ablations: none CHADS2VASC score: 3 Anticoagulation history: Xarelto   Past Medical History:  Diagnosis Date  . Arthritis   . Bradycardia   . Bronchitis   . Depression   . Glaucoma   . Hypertension   . Seasonal allergies   . Wears glasses    Past Surgical History:  Procedure  Laterality Date  . ABDOMINAL HYSTERECTOMY  1990  . BREAST BIOPSY Right    benign  . CARDIOVERSION N/A 08/15/2019   Procedure: CARDIOVERSION;  Surgeon: Nigel Mormon, MD;  Location: [facility];  Service: Cardiovascular;  Laterality: N/A;  . COLONOSCOPY    . meniscus tear knee     left knee  . NASAL SINUS SURGERY  1975  . ORIF ANKLE FRACTURE  2009   left  . TOTAL KNEE ARTHROPLASTY Left 02/03/2019   Procedure: LEFT TOTAL KNEE ARTHROPLASTY;  Surgeon: Mcarthur Rossetti, MD;  Location: [facility];  Service: Orthopedics;  Laterality: Left;  . TURBINATE REDUCTION Bilateral 06/04/2014   Procedure: BILATERAL TURBINATE REDUCTION;  Surgeon: Leta Baptist, MD;  Location: [facility];  Service: ENT;  Laterality: Bilateral;  . TYMPANOSTOMY TUBE PLACEMENT     left    Current Outpatient Medications  Medication Sig Dispense Refill  . acetaminophen (TYLENOL) 500 MG tablet Take 1,000 mg by mouth every 6 (six) hours as needed. pain    . albuterol (VENTOLIN HFA) 108 (90 Base) MCG/ACT inhaler Inhale 1-2 puffs into the lungs every 6 (six) hours as needed for wheezing or shortness of breath.    Marland Kitchen amLODipine (NORVASC) 5 MG tablet Take 5 mg by mouth at bedtime.     Marland Kitchen b complex vitamins tablet Take 1 tablet by mouth daily.    Marland Kitchen buPROPion (WELLBUTRIN XL) 300 MG 24 hr tablet Take 300 mg by mouth daily.    . calcium carbonate (OS-CAL - DOSED IN MG OF ELEMENTAL CALCIUM) 1250 (500 Ca)  MG tablet Take 1 tablet by mouth daily with breakfast.    . cetirizine (ZYRTEC) 10 MG tablet Take 10 mg by mouth at bedtime.    . Cholecalciferol (VITAMIN D) 50 MCG (2000 UT) tablet 1 tablet    . FLUoxetine (PROZAC) 40 MG capsule     . Fluticasone-Salmeterol (ADVAIR) 250-50 MCG/DOSE AEPB Inhale 1 puff into the lungs 2 (two) times daily as needed (asthma).    . guaiFENesin (MUCINEX) 600 MG 12 hr tablet 1 tablet for chest congestion    . ipratropium (ATROVENT) 0.06 % nasal spray as needed.    . latanoprost (XALATAN) 0.005  % ophthalmic solution Place 1 drop into both eyes every morning.     . meloxicam (MOBIC) 15 MG tablet as needed.    . metoprolol tartrate (LOPRESSOR) 25 MG tablet Take 1 tablet (25 mg total) by mouth 2 (two) times daily. 180 tablet 3  . montelukast (SINGULAIR) 10 MG tablet Take 10 mg by mouth at bedtime.    . Multiple Vitamins-Minerals (MULTIVITAMIN WITH MINERALS) tablet Take 1 tablet by mouth daily.    . nitroGLYCERIN (NITROSTAT) 0.4 MG SL tablet Place 1 tablet (0.4 mg total) under the tongue every 5 (five) minutes as needed. 30 tablet 3  . Omega-3 Fatty Acids (FISH OIL) 1000 MG CAPS Take 1,000 mg by mouth daily.    Marland Kitchen omeprazole (PRILOSEC) 20 MG capsule as needed.    . Probiotic Product (PROBIOTIC DAILY PO) Take 1 capsule by mouth daily.    . rivaroxaban (XARELTO) 20 MG TABS tablet Take 1 tablet (20 mg total) by mouth daily with supper. 90 tablet 3  . rosuvastatin (CRESTOR) 10 MG tablet Take 1 tablet (10 mg total) by mouth daily. 90 tablet 3  . Turmeric 500 MG CAPS Take 500 mg by mouth daily.    . vitamin C (ASCORBIC ACID) 500 MG tablet Take 500 mg by mouth daily.     No current facility-administered medications for this encounter.    Allergies  Allergen Reactions  . Dexlansoprazole Other (See Comments)  . Flecainide   . Ceclor [Cefaclor] Hives  . Ciprofloxacin Hives  . Flagyl [Metronidazole] Hives    Social History   Socioeconomic History  . Marital status: Married    Spouse name: Not on file  . Number of children: 0  . Years of education: Not on file  . Highest education level: Not on file  Occupational History  . Not on file  Tobacco Use  . Smoking status: Former Smoker    Packs/day: 2.00    Years: 30.00    Pack years: 60.00    Types: Cigarettes    Quit date: 05/28/1998    Years since quitting: 21.8  . Smokeless tobacco: Never Used  Vaping Use  . Vaping Use: Never used  Substance and Sexual Activity  . Alcohol use: Yes    Alcohol/week: 2.0 standard drinks     Types: 2 Glasses of wine per week    Comment: daily-wine daily-1 glass  . Drug use: No  . Sexual activity: Not on file  Other Topics Concern  . Not on file  Social History Narrative  . Not on file   Social Determinants of Health   Financial Resource Strain: Not on file  Food Insecurity: Not on file  Transportation Needs: Not on file  Physical Activity: Not on file  Stress: Not on file  Social Connections: Not on file  Intimate Partner Violence: Not on file     ROS-  All systems are reviewed and negative except as per the HPI above.  Physical Exam: Vitals:   03/27/20 1351  BP: 118/78  Pulse: 75  Weight: 69.6 kg  Height: 5\' 5"  (1.651 m)    GEN- The patient is well appearing, alert and oriented x 3 today.   Head- normocephalic, atraumatic Eyes-  Sclera clear, conjunctiva pink Ears- hearing intact Oropharynx- clear Neck- supple  Lungs- Clear to ausculation bilaterally, normal work of breathing Heart- irregular rate and rhythm, no murmurs, rubs or gallops  GI- soft, NT, ND, + BS Extremities- no clubbing, cyanosis, or edema MS- no significant deformity or atrophy Skin- no rash or lesion Psych- euthymic mood, full affect Neuro- strength and sensation are intact  Wt Readings from Last 3 Encounters:  03/27/20 69.6 kg  03/08/20 69.9 kg  02/07/20 70.3 kg    EKG today demonstrates  Afib, inc RBBB, LAFB Vent. rate 75 BPM QRS duration 110 ms QT/QTc 404/451 ms  Echo 02/06/20 demonstrated  Left ventricle cavity is normal in size. Mild concentric hypertrophy of  the left ventricle. Normal LV systolic function with visual EF 50-55%.  Normal global wall motion. Unable to evaluate diastolic function due to  atrial fibrillation.  Left atrial cavity is severely dilated.  Right atrial cavity is mildly dilated.  Trileaflet aortic valve. Moderate (Grade III) aortic regurgitation.  Moderate (Grade III) mitral regurgitation.  Moderate tricuspid regurgitation. Estimated  pulmonary artery systolic  pressure 31 mmHg.  Mild pulmonic regurgitation.  No significant change compared to previous study on 03/07/2019.  Epic records are reviewed at length today  CHA2DS2-VASc Score = 4  The patient's score is based upon: CHF History: No HTN History: Yes Diabetes History: No Stroke History: No Vascular Disease History: Yes Age Score: 1 Gender Score: 1      ASSESSMENT AND PLAN: 1. Persistent Atrial Fibrillation (ICD10:  I48.19) The patient's CHA2DS2-VASc score is 4, indicating a 4.8% annual risk of stroke.   We discussed therapeutic options today. Would avoid class IC with h/o side effects and baseline conduction disease. Would also avoid Multaq with prolonged PR in SR (280 ms). Unlikely ablation candidate with severely dilated LA on last echo.  Can consider dofetilide admission. Will plan for loading once COVID hospitalizations decrease. Patient to check on price of medication.  Check bmet/mag Continue Xarelto 20 mg daily Continue Lopressor 25 mg BID  2. Secondary Hypercoagulable State (ICD10:  D68.69) The patient is at significant risk for stroke/thromboembolism based upon her CHA2DS2-VASc Score of 4.  Continue Rivaroxaban (Xarelto).   3. Snoring/daytime somnolence The importance of adequate treatment of sleep apnea was discussed today in order to improve our ability to maintain sinus rhythm long term. Will order sleep study.  4. HTN Stable, no changes today.  6. Valvular heart disease Moderate MR, moderate AI Followed by Dr Gardiner Rhyme, repeat echo scheduled.    Follow up in the AF clinic in 3 weeks.    [facility] [address] [place], [place] [address] [phone] 03/27/2020 2:00 PM

## 2020-03-28 ENCOUNTER — Ambulatory Visit: Payer: Medicare HMO | Admitting: Physician Assistant

## 2020-03-29 ENCOUNTER — Telehealth: Payer: Self-pay | Admitting: *Deleted

## 2020-03-29 NOTE — Telephone Encounter (Signed)
Mychart message sent to patient in reference to her sleep study appointment.

## 2020-03-29 NOTE — Telephone Encounter (Signed)
PA for in lab sleep study submitted via web portal to [facility]).

## 2020-04-02 ENCOUNTER — Other Ambulatory Visit: Payer: Self-pay

## 2020-04-02 ENCOUNTER — Ambulatory Visit
Admission: RE | Admit: 2020-04-02 | Discharge: 2020-04-02 | Disposition: A | Discharge status: Home / Self Care | Payer: Medicare HMO | Source: Ambulatory Visit | Attending: Family Medicine | Admitting: Family Medicine

## 2020-04-02 DIAGNOSIS — M8580 Other specified disorders of bone density and structure, unspecified site: Secondary | ICD-10-CM

## 2020-04-02 DIAGNOSIS — Z780 Asymptomatic menopausal state: Secondary | ICD-10-CM | POA: Diagnosis not present

## 2020-04-03 ENCOUNTER — Ambulatory Visit: Payer: Medicare HMO | Admitting: Physician Assistant

## 2020-04-09 DIAGNOSIS — M47817 Spondylosis without myelopathy or radiculopathy, lumbosacral region: Secondary | ICD-10-CM | POA: Diagnosis not present

## 2020-04-09 DIAGNOSIS — M545 Low back pain, unspecified: Secondary | ICD-10-CM | POA: Diagnosis not present

## 2020-04-09 DIAGNOSIS — M48061 Spinal stenosis, lumbar region without neurogenic claudication: Secondary | ICD-10-CM | POA: Diagnosis not present

## 2020-04-15 ENCOUNTER — Other Ambulatory Visit (HOSPITAL_COMMUNITY): Payer: Self-pay | Admitting: *Deleted

## 2020-04-15 DIAGNOSIS — I480 Paroxysmal atrial fibrillation: Secondary | ICD-10-CM

## 2020-04-15 MED ORDER — METOPROLOL TARTRATE 25 MG PO TABS: 25.0000 mg | ORAL_TABLET | Freq: Two times a day (BID) | ORAL | 1 refills | Status: DC

## 2020-04-16 DIAGNOSIS — M545 Low back pain, unspecified: Secondary | ICD-10-CM | POA: Diagnosis not present

## 2020-04-16 NOTE — Progress Notes (Signed)
Primary Care Physician: Mayra Neer, MD Primary Cardiologist: Dr Gardiner Rhyme Primary Electrophysiologist: none Referring Physician: Dr Koleen Nimrod Michelle Johnston is a 70 y.o. female with a history of atrial fibrillation, HTN, PAD, moderate MR, moderate AI, tobacco use who presents for follow up in the [facility]. She underwent DCCV on 08/15/19 but was back in afib on follow up one week later. Patient was started on flecainide but developed dizziness and vision blurring and this was discontinued. Patient is on Xarelto for a CHADS2VASC score of 3. She has symptoms of fatigue and dyspnea with exertion while she is in afib. She does admit to snoring and daytime somnolence.   On follow up today, patient reports she has done reasonably well since his last visit. She remains in rate controlled afib with symptoms of exercise intolerance. She denies any bleeding issues on anticoagulation.   Today, she denies symptoms of palpitations, chest pain, orthopnea, PND, lower extremity edema, dizziness, presyncope, syncope, bleeding, or neurologic sequela. The patient is tolerating medications without difficulties and is otherwise without complaint today.    Atrial Fibrillation Risk Factors:  she does have symptoms or diagnosis of sleep apnea. she does have a history of rheumatic fever. she does not have a history of alcohol use. The patient does not have a history of early familial atrial fibrillation or other arrhythmias.  she has a BMI of Body mass index is 25.33 kg/m.Marland Kitchen Filed Weights   04/17/20 1142  Weight: 69 kg    Family History  Problem Relation Age of Onset  . Hypertension Mother   . Hyperlipidemia Mother   . Lymphoma Mother   . Lung cancer Father      Atrial Fibrillation Management history:  Previous antiarrhythmic drugs: flecainide Previous cardioversions: 08/17/19 Previous ablations: none CHADS2VASC score: 3 Anticoagulation history: Xarelto   Past  Medical History:  Diagnosis Date  . Arthritis   . Bradycardia   . Bronchitis   . Depression   . Glaucoma   . Hypertension   . Seasonal allergies   . Wears glasses    Past Surgical History:  Procedure Laterality Date  . ABDOMINAL HYSTERECTOMY  1990  . BREAST BIOPSY Right    benign  . CARDIOVERSION N/A 08/15/2019   Procedure: CARDIOVERSION;  Surgeon: Nigel Mormon, MD;  Location: [facility];  Service: Cardiovascular;  Laterality: N/A;  . COLONOSCOPY    . meniscus tear knee     left knee  . NASAL SINUS SURGERY  1975  . ORIF ANKLE FRACTURE  2009   left  . TOTAL KNEE ARTHROPLASTY Left 02/03/2019   Procedure: LEFT TOTAL KNEE ARTHROPLASTY;  Surgeon: Mcarthur Rossetti, MD;  Location: [facility];  Service: Orthopedics;  Laterality: Left;  . TURBINATE REDUCTION Bilateral 06/04/2014   Procedure: BILATERAL TURBINATE REDUCTION;  Surgeon: Leta Baptist, MD;  Location: [place];  Service: ENT;  Laterality: Bilateral;  . TYMPANOSTOMY TUBE PLACEMENT     left    Current Outpatient Medications  Medication Sig Dispense Refill  . acetaminophen (TYLENOL) 500 MG tablet Take 1,000 mg by mouth every 6 (six) hours as needed. pain    . albuterol (VENTOLIN HFA) 108 (90 Base) MCG/ACT inhaler Inhale 1-2 puffs into the lungs every 6 (six) hours as needed for wheezing or shortness of breath.    Marland Kitchen amLODipine (NORVASC) 5 MG tablet Take 5 mg by mouth at bedtime.     Marland Kitchen b complex vitamins tablet Take 1 tablet by mouth  daily.    . buPROPion (WELLBUTRIN XL) 300 MG 24 hr tablet 150 mg.    . calcium carbonate (OS-CAL - DOSED IN MG OF ELEMENTAL CALCIUM) 1250 (500 Ca) MG tablet Take 1 tablet by mouth daily with breakfast.    . cetirizine (ZYRTEC) 10 MG tablet Take 10 mg by mouth at bedtime.    . Cholecalciferol (VITAMIN D) 50 MCG (2000 UT) tablet 1 tablet    . FLUoxetine (PROZAC) 40 MG capsule     . Fluticasone-Salmeterol (ADVAIR) 250-50 MCG/DOSE AEPB Inhale 1 puff into the lungs 2 (two) times  daily as needed (asthma).    . guaiFENesin (MUCINEX) 600 MG 12 hr tablet 1 tablet for chest congestion    . ipratropium (ATROVENT) 0.06 % nasal spray as needed.    . latanoprost (XALATAN) 0.005 % ophthalmic solution Place 1 drop into both eyes every morning.     . meloxicam (MOBIC) 15 MG tablet as needed.    . metoprolol tartrate (LOPRESSOR) 25 MG tablet Take 1 tablet (25 mg total) by mouth 2 (two) times daily. 180 tablet 1  . montelukast (SINGULAIR) 10 MG tablet Take 10 mg by mouth at bedtime.    . Multiple Vitamins-Minerals (MULTIVITAMIN WITH MINERALS) tablet Take 1 tablet by mouth daily.    . nitroGLYCERIN (NITROSTAT) 0.4 MG SL tablet Place 1 tablet (0.4 mg total) under the tongue every 5 (five) minutes as needed. 30 tablet 3  . Omega-3 Fatty Acids (FISH OIL) 1000 MG CAPS Take 1,000 mg by mouth daily.    Marland Kitchen omeprazole (PRILOSEC) 20 MG capsule as needed.    . Probiotic Product (PROBIOTIC DAILY PO) Take 1 capsule by mouth daily.    . rivaroxaban (XARELTO) 20 MG TABS tablet Take 1 tablet (20 mg total) by mouth daily with supper. 90 tablet 3  . rosuvastatin (CRESTOR) 10 MG tablet Take 1 tablet (10 mg total) by mouth daily. 90 tablet 3  . Turmeric 500 MG CAPS Take 500 mg by mouth daily.    . vitamin C (ASCORBIC ACID) 500 MG tablet Take 500 mg by mouth daily.     No current facility-administered medications for this encounter.    Allergies  Allergen Reactions  . Ciprofibrate     Other reaction(s): rash?? (see OV 12/24/2011)  . Dexlansoprazole Other (See Comments)  . Flecainide   . Other     Other reaction(s): head congestion(Nasal Steroid)  . Ceclor [Cefaclor] Hives  . Ciprofloxacin Hives  . Flagyl [Metronidazole] Hives    Social History   Socioeconomic History  . Marital status: Married    Spouse name: Not on file  . Number of children: 0  . Years of education: Not on file  . Highest education level: Not on file  Occupational History  . Not on file  Tobacco Use  . Smoking  status: Former Smoker    Packs/day: 2.00    Years: 30.00    Pack years: 60.00    Types: Cigarettes    Quit date: 05/28/1998    Years since quitting: 21.9  . Smokeless tobacco: Never Used  Vaping Use  . Vaping Use: Never used  Substance and Sexual Activity  . Alcohol use: Yes    Alcohol/week: 3.0 standard drinks    Types: 2 Glasses of wine, 1 Cans of beer per week    Comment: daily-wine daily-1 glass  . Drug use: No  . Sexual activity: Not on file  Other Topics Concern  . Not on file  Social History  Narrative  . Not on file   Social Determinants of Health   Financial Resource Strain: Not on file  Food Insecurity: Not on file  Transportation Needs: Not on file  Physical Activity: Not on file  Stress: Not on file  Social Connections: Not on file  Intimate Partner Violence: Not on file     ROS- All systems are reviewed and negative except as per the HPI above.  Physical Exam: Vitals:   04/17/20 1142  BP: 116/72  Pulse: 65  Weight: 69 kg  Height: 5\' 5"  (1.651 m)    GEN- The patient is well appearing, alert and oriented x 3 today.   HEENT-head normocephalic, atraumatic, sclera clear, conjunctiva pink, hearing intact, trachea midline. Lungs- Clear to ausculation bilaterally, normal work of breathing Heart- irregular rate and rhythm, no murmurs, rubs or gallops  GI- soft, NT, ND, + BS Extremities- no clubbing, cyanosis, or edema MS- no significant deformity or atrophy Skin- no rash or lesion Psych- euthymic mood, full affect Neuro- strength and sensation are intact   Wt Readings from Last 3 Encounters:  04/17/20 69 kg  03/27/20 69.6 kg  03/08/20 69.9 kg    EKG today demonstrates  Afib, inc RBBB, LAFB Vent. rate 65 BPM QRS duration 106 ms QT/QTc 428/445 ms  Echo 02/06/20 demonstrated  Left ventricle cavity is normal in size. Mild concentric hypertrophy of  the left ventricle. Normal LV systolic function with visual EF 50-55%.  Normal global wall motion.  Unable to evaluate diastolic function due to  atrial fibrillation.  Left atrial cavity is severely dilated.  Right atrial cavity is mildly dilated.  Trileaflet aortic valve. Moderate (Grade III) aortic regurgitation.  Moderate (Grade III) mitral regurgitation.  Moderate tricuspid regurgitation. Estimated pulmonary artery systolic  pressure 31 mmHg.  Mild pulmonic regurgitation.  No significant change compared to previous study on 03/07/2019.  Epic records are reviewed at length today  CHA2DS2-VASc Score = 4  The patient's score is based upon: CHF History: No HTN History: Yes Diabetes History: No Stroke History: No Vascular Disease History: Yes Age Score: 1 Gender Score: 1      ASSESSMENT AND PLAN: 1. Persistent Atrial Fibrillation (ICD10:  I48.19) The patient's CHA2DS2-VASc score is 4, indicating a 4.8% annual risk of stroke.   We again discussed therapeutic options today. Would avoid class IC with h/o side effects and baseline conduction disease. Would also avoid Multaq with prolonged PR in SR (280 ms).  Her echo at [facility] read her LA as severely dilated, diam 4.0 and volume 99 mL.  We discussed dofetilide admission vs ablation. Given the persistence of her afib, she may do better with medication but she would like more information about ablation. Will refer to EP. She is currently on waiting list for dofetilide once elective admissions reopen. Her QT in SR is 439 ms Continue Xarelto 20 mg daily Continue Lopressor 25 mg BID  2. Secondary Hypercoagulable State (ICD10:  D68.69) The patient is at significant risk for stroke/thromboembolism based upon her CHA2DS2-VASc Score of 4.  Continue Rivaroxaban (Xarelto).   3. Snoring/daytime somnolence Sleep study pending.   4. HTN Stable, no changes today.  5. Valvular heart disease Moderate MR, moderate AI Repeat echo scheduled.    Follow up with EP to discuss ablation vs dofetilide.    [facility] [address] [place], [place] [address] [phone] 04/17/2020 11:47 AM

## 2020-04-17 ENCOUNTER — Ambulatory Visit (HOSPITAL_COMMUNITY)
Admission: RE | Admit: 2020-04-17 | Discharge: 2020-04-17 | Disposition: A | Discharge status: Home / Self Care | Payer: Medicare HMO | Source: Ambulatory Visit | Attending: Physician Assistant | Admitting: Physician Assistant

## 2020-04-17 ENCOUNTER — Other Ambulatory Visit: Payer: Self-pay

## 2020-04-17 ENCOUNTER — Encounter (HOSPITAL_COMMUNITY): Payer: Self-pay | Admitting: Physician Assistant

## 2020-04-17 VITALS — BP 116/72 | HR 65 | Ht 65.0 in | Wt 152.2 lb

## 2020-04-17 DIAGNOSIS — R0683 Snoring: Secondary | ICD-10-CM | POA: Diagnosis not present

## 2020-04-17 DIAGNOSIS — I10 Essential (primary) hypertension: Secondary | ICD-10-CM | POA: Insufficient documentation

## 2020-04-17 DIAGNOSIS — I080 Rheumatic disorders of both mitral and aortic valves: Secondary | ICD-10-CM | POA: Insufficient documentation

## 2020-04-17 DIAGNOSIS — Z87891 Personal history of nicotine dependence: Secondary | ICD-10-CM | POA: Diagnosis not present

## 2020-04-17 DIAGNOSIS — Z888 Allergy status to other drugs, medicaments and biological substances status: Secondary | ICD-10-CM | POA: Insufficient documentation

## 2020-04-17 DIAGNOSIS — I4819 Other persistent atrial fibrillation: Secondary | ICD-10-CM | POA: Diagnosis not present

## 2020-04-17 DIAGNOSIS — I739 Peripheral vascular disease, unspecified: Secondary | ICD-10-CM | POA: Diagnosis not present

## 2020-04-17 DIAGNOSIS — Z7901 Long term (current) use of anticoagulants: Secondary | ICD-10-CM | POA: Diagnosis not present

## 2020-04-17 DIAGNOSIS — D6869 Other thrombophilia: Secondary | ICD-10-CM | POA: Diagnosis not present

## 2020-04-17 DIAGNOSIS — Z79899 Other long term (current) drug therapy: Secondary | ICD-10-CM | POA: Diagnosis not present

## 2020-04-17 DIAGNOSIS — Z881 Allergy status to other antibiotic agents status: Secondary | ICD-10-CM | POA: Insufficient documentation

## 2020-04-17 DIAGNOSIS — Z8249 Family history of ischemic heart disease and other diseases of the circulatory system: Secondary | ICD-10-CM | POA: Insufficient documentation

## 2020-04-26 DIAGNOSIS — M5416 Radiculopathy, lumbar region: Secondary | ICD-10-CM | POA: Diagnosis not present

## 2020-05-14 ENCOUNTER — Other Ambulatory Visit: Payer: Self-pay

## 2020-05-14 ENCOUNTER — Ambulatory Visit (HOSPITAL_BASED_OUTPATIENT_CLINIC_OR_DEPARTMENT_OTHER): Payer: Medicare HMO | Attending: Physician Assistant | Admitting: Cardiovascular Disease

## 2020-05-14 DIAGNOSIS — Z791 Long term (current) use of non-steroidal anti-inflammatories (NSAID): Secondary | ICD-10-CM | POA: Insufficient documentation

## 2020-05-14 DIAGNOSIS — G4736 Sleep related hypoventilation in conditions classified elsewhere: Secondary | ICD-10-CM | POA: Insufficient documentation

## 2020-05-14 DIAGNOSIS — Z7951 Long term (current) use of inhaled steroids: Secondary | ICD-10-CM | POA: Insufficient documentation

## 2020-05-14 DIAGNOSIS — Z79899 Other long term (current) drug therapy: Secondary | ICD-10-CM | POA: Diagnosis not present

## 2020-05-14 DIAGNOSIS — G4733 Obstructive sleep apnea (adult) (pediatric): Secondary | ICD-10-CM | POA: Insufficient documentation

## 2020-05-14 DIAGNOSIS — Z7901 Long term (current) use of anticoagulants: Secondary | ICD-10-CM | POA: Insufficient documentation

## 2020-05-14 DIAGNOSIS — I4819 Other persistent atrial fibrillation: Secondary | ICD-10-CM | POA: Insufficient documentation

## 2020-05-15 ENCOUNTER — Other Ambulatory Visit (HOSPITAL_BASED_OUTPATIENT_CLINIC_OR_DEPARTMENT_OTHER): Payer: Self-pay

## 2020-05-15 DIAGNOSIS — I4819 Other persistent atrial fibrillation: Secondary | ICD-10-CM

## 2020-05-21 ENCOUNTER — Other Ambulatory Visit: Payer: Self-pay

## 2020-05-21 ENCOUNTER — Ambulatory Visit: Payer: Medicare HMO | Admitting: Cardiology

## 2020-05-21 ENCOUNTER — Encounter: Payer: Self-pay | Admitting: Cardiology

## 2020-05-21 VITALS — BP 112/74 | HR 81 | Ht 65.0 in | Wt 152.0 lb

## 2020-05-21 DIAGNOSIS — I4819 Other persistent atrial fibrillation: Secondary | ICD-10-CM

## 2020-05-21 DIAGNOSIS — I10 Essential (primary) hypertension: Secondary | ICD-10-CM

## 2020-05-21 NOTE — Patient Instructions (Addendum)
Medication Instructions:  Your physician recommends that you continue on your current medications as directed. Please refer to the Current Medication list given to you today.  Labwork: None ordered.  Testing/Procedures: None ordered.  Follow-Up: Your physician wants you to follow-up in: 6- 8 weeks with Dr. Quentin Ore and move echo scheduled in July to before the follow up.    Any Other Special Instructions Will Be Listed Below (If Applicable).  If you need a refill on your cardiac medications before your next appointment, please call your pharmacy.

## 2020-05-21 NOTE — Progress Notes (Signed)
Electrophysiology Office Note:    Date:  05/21/2020   ID:  Trenton, Verne 1950/05/30, MRN [id number]  PCP:  Mayra Neer, MD  [facility] Cardiologist:  No primary care provider on file.  [facility] Electrophysiologist:  Vickie Epley, MD   Referring MD: Oliver Barre, PA   Chief Complaint: atrial fibrillation  History of Present Illness:    Michelle Johnston is a 70 y.o. female who presents for an evaluation of AF at the request of Adline Peals, PA-C. Their medical history includes hypertension, depression, bradycardia.  Patient last saw Adline Peals, PA-C on April 17, 2020.  For her atrial fibrillation she has previously undergone cardioversion in June 2021.  She was then started on flecainide but did not tolerate this with dizziness and visual changes.  She continues with Xarelto for stroke prophylaxis.  While she is in atrial fibrillation, she is symptomatic with exercise intolerance.  She is being referred to discuss possible ablation therapy for her atrial fibrillation.     Past Medical History:  Diagnosis Date  . Arthritis   . Bradycardia   . Bronchitis   . Depression   . Glaucoma   . Hypertension   . Seasonal allergies   . Wears glasses     Past Surgical History:  Procedure Laterality Date  . ABDOMINAL HYSTERECTOMY  1990  . BREAST BIOPSY Right    benign  . CARDIOVERSION N/A 08/15/2019   Procedure: CARDIOVERSION;  Surgeon: Nigel Mormon, MD;  [facility];  Service: Cardiovascular;  Laterality: N/A;  . COLONOSCOPY    . meniscus tear knee     left knee  . NASAL SINUS SURGERY  1975  . ORIF ANKLE FRACTURE  2009   left  . TOTAL KNEE ARTHROPLASTY Left 02/03/2019   Procedure: LEFT TOTAL KNEE ARTHROPLASTY;  Surgeon: Mcarthur Rossetti, MD;  Location: [facility];  Service: Orthopedics;  Laterality: Left;  . TURBINATE REDUCTION Bilateral 06/04/2014   Procedure: BILATERAL TURBINATE REDUCTION;  Surgeon: Leta Baptist, MD;  Location: [place];  Service: ENT;  Laterality: Bilateral;  . TYMPANOSTOMY TUBE PLACEMENT     left    Current Medications: Current Meds  Medication Sig  . acetaminophen (TYLENOL) 500 MG tablet Take 1,000 mg by mouth every 6 (six) hours as needed. pain  . albuterol (VENTOLIN HFA) 108 (90 Base) MCG/ACT inhaler Inhale 1-2 puffs into the lungs every 6 (six) hours as needed for wheezing or shortness of breath.  Marland Kitchen amLODipine (NORVASC) 5 MG tablet Take 5 mg by mouth at bedtime.   Marland Kitchen b complex vitamins tablet Take 1 tablet by mouth daily.  Marland Kitchen buPROPion (WELLBUTRIN XL) 300 MG 24 hr tablet 150 mg.  . calcium carbonate (OS-CAL - DOSED IN MG OF ELEMENTAL CALCIUM) 1250 (500 Ca) MG tablet Take 1 tablet by mouth daily with breakfast.  . cetirizine (ZYRTEC) 10 MG tablet Take 10 mg by mouth at bedtime.  . Cholecalciferol (VITAMIN D) 50 MCG (2000 UT) tablet 1 tablet  . FLUoxetine (PROZAC) 40 MG capsule   . Fluticasone-Salmeterol (ADVAIR) 250-50 MCG/DOSE AEPB Inhale 1 puff into the lungs 2 (two) times daily as needed (asthma).  . gabapentin (NEURONTIN) 300 MG capsule Take by mouth 3 (three) times daily.  Marland Kitchen guaiFENesin (MUCINEX) 600 MG 12 hr tablet 1 tablet for chest congestion  . ipratropium (ATROVENT) 0.06 % nasal spray as needed.  . latanoprost (XALATAN) 0.005 % ophthalmic solution Place 1 drop into both eyes every morning.   Marland Kitchen  meloxicam (MOBIC) 15 MG tablet as needed.  . metoprolol tartrate (LOPRESSOR) 25 MG tablet Take 1 tablet (25 mg total) by mouth 2 (two) times daily.  . montelukast (SINGULAIR) 10 MG tablet Take 10 mg by mouth at bedtime.  . Multiple Vitamins-Minerals (MULTIVITAMIN WITH MINERALS) tablet Take 1 tablet by mouth daily.  . Omega-3 Fatty Acids (FISH OIL) 1000 MG CAPS Take 1,000 mg by mouth daily.  Marland Kitchen omeprazole (PRILOSEC) 20 MG capsule as needed.  . Probiotic Product (PROBIOTIC DAILY PO) Take 1 capsule by mouth daily.  . rivaroxaban (XARELTO) 20 MG TABS tablet Take 1 tablet (20 mg total) by  mouth daily with supper.  . rosuvastatin (CRESTOR) 10 MG tablet Take 1 tablet (10 mg total) by mouth daily.  . Turmeric 500 MG CAPS Take 500 mg by mouth daily.  . vitamin C (ASCORBIC ACID) 500 MG tablet Take 500 mg by mouth daily.     Allergies:   Ciprofibrate, Dexlansoprazole, Flecainide, Other, Ceclor [cefaclor], Ciprofloxacin, and Flagyl [metronidazole]   Social History   Socioeconomic History  . Marital status: Married    Spouse name: Not on file  . Number of children: 0  . Years of education: Not on file  . Highest education level: Not on file  Occupational History  . Not on file  Tobacco Use  . Smoking status: Former Smoker    Packs/day: 2.00    Years: 30.00    Pack years: 60.00    Types: Cigarettes    Quit date: 05/28/1998    Years since quitting: 21.9  . Smokeless tobacco: Never Used  Vaping Use  . Vaping Use: Never used  Substance and Sexual Activity  . Alcohol use: Yes    Alcohol/week: 3.0 standard drinks    Types: 2 Glasses of wine, 1 Cans of beer per week    Comment: daily-wine daily-1 glass  . Drug use: No  . Sexual activity: Not on file  Other Topics Concern  . Not on file  Social History Narrative  . Not on file   Social Determinants of Health   Financial Resource Strain: Not on file  Food Insecurity: Not on file  Transportation Needs: Not on file  Physical Activity: Not on file  Stress: Not on file  Social Connections: Not on file     Family History: The patient's family history includes Hyperlipidemia in her mother; Hypertension in her mother; Lung cancer in her father; Lymphoma in her mother.  ROS:   Please see the history of present illness.    All other systems reviewed and are negative.  EKGs/Labs/Other Studies Reviewed:    The following studies were reviewed today:  February 06, 2020 echo (report only) LV function normal, 55% Dilated left and right atria Moderate AR Moderate MR Moderate TR  April 17, 2020 ECG shows atrial  fibrillation with a slow ventricular response, 65 bpm.  Incomplete right bundle branch block, left anterior fascicular block.  December 2020 EKGs reviewed and demonstrates sinus rhythm.  Right bundle branch block and left anterior fascicular block.  EKG:  The ekg ordered today demonstrates atrial fibrillation with an incomplete right bundle branch block.  Left anterior fascicular block.  Recent Labs: 02/07/2020: ALT 21; Hemoglobin 14.6; Platelets 265 03/27/2020: BUN 18; Creatinine, Ser 0.82; Magnesium 2.0; Potassium 4.3; Sodium 133  Recent Lipid Panel No results found for: CHOL, TRIG, HDL, CHOLHDL, VLDL, LDLCALC, LDLDIRECT  Physical Exam:    VS:  BP 112/74   Pulse 81   Ht 5\' 5"  (  1.651 m)   Wt 152 lb (68.9 kg)   SpO2 96%   BMI 25.29 kg/m     Wt Readings from Last 3 Encounters:  05/21/20 152 lb (68.9 kg)  05/14/20 150 lb (68 kg)  04/17/20 152 lb 3.2 oz (69 kg)     GEN:  Well nourished, well developed in no acute distress HEENT: Normal NECK: No JVD; No carotid bruits LYMPHATICS: No lymphadenopathy CARDIAC: Irregularly irregular, 2 out of 6 blowing systolic murmur at the left lower sternal border RESPIRATORY:  Clear to auscultation without rales, wheezing or rhonchi  ABDOMEN: Soft, non-tender, non-distended MUSCULOSKELETAL:  No edema; No deformity  SKIN: Warm and dry NEUROLOGIC:  Alert and oriented x 3 PSYCHIATRIC:  Normal affect   ASSESSMENT:    1. Persistent atrial fibrillation ([place])   2. Essential hypertension    PLAN:    In order of problems listed above:  1. Persistent atrial fibrillation Rate controlled with a ventricular rate of 81 bpm during today's visit.  In the setting of valvular heart disease with at least moderate MR.  She has not tolerated antiarrhythmic therapy with flecainide in the past.  At this point, there are 3 strategies.  The first would be to rate control her and continue anticoagulation.  The second would be to attempt a rhythm control strategy  with an alternative antiarrhythmic medication such as dofetilide or amiodarone.  The last strategy would be to pursue an invasive approach to rhythm control with PVI.  I would like to reassess her valvular heart disease with an echocardiogram and see her back in 6 to 8 weeks.  If the valvular heart disease is severe, would pursue antiarrhythmic therapy with dofetilide loading.  If her valvular heart disease is truly moderate, we will plan to pursue ablation.  I discussed the ablation procedure in detail with the patient during today's visit including the risks, expected efficacy and recovery times.  She is very interested but she understands the need to assess the echo prior to making the final decision.  In the meantime she should continue her anticoagulation for stroke prophylaxis.  2.  Hypertension Controlled during today's visit. Continue home medications  Follow-up 6 to 8 weeks.  Medication Adjustments/Labs and Tests Ordered: Current medicines are reviewed at length with the patient today.  Concerns regarding medicines are outlined above.  Orders Placed This Encounter  Procedures  . EKG 12-Lead   No orders of the defined types were placed in this encounter.    Signed, Lars Mage, MD, [facility]  05/21/2020 9:35 AM    Electrophysiology [facility]

## 2020-05-23 DIAGNOSIS — J0100 Acute maxillary sinusitis, unspecified: Secondary | ICD-10-CM | POA: Diagnosis not present

## 2020-05-23 DIAGNOSIS — M5416 Radiculopathy, lumbar region: Secondary | ICD-10-CM | POA: Diagnosis not present

## 2020-05-30 NOTE — Procedures (Signed)
Patient Name: Michelle Johnston, Michelle Johnston Date: 05/14/2020 Gender: Female D.O.B: [birth year] Age (years): 70 Referring Provider: Malka So PA Height (inches): 65 Interpreting Physician: Shelva Majestic MD, ABSM Weight (lbs): 150 RPSGT: Carolin Coy BMI: 25 MRN: [id number] Neck Size: 13.50  CLINICAL INFORMATION Sleep Study Type: NPSG  Indication for sleep study: Fatigue, Hypertension, Snoring, atrial fibrillation  Epworth Sleepiness Score: 11  SLEEP STUDY TECHNIQUE As per the AASM Manual for the Scoring of Sleep and Associated Events v2.3 (April 2016) with a hypopnea requiring 4% desaturations.  The channels recorded and monitored were frontal, central and occipital EEG, electrooculogram (EOG), submentalis EMG (chin), nasal and oral airflow, thoracic and abdominal wall motion, anterior tibialis EMG, snore microphone, electrocardiogram, and pulse oximetry.  MEDICATIONS acetaminophen (TYLENOL) 500 MG tablet  albuterol (VENTOLIN HFA) 108 (90 Base) MCG/ACT inhaler  amLODipine (NORVASC) 5 MG tablet  b complex vitamins tablet  buPROPion (WELLBUTRIN XL) 300 MG 24 hr tablet  calcium carbonate (OS-CAL - DOSED IN MG OF ELEMENTAL CALCIUM) 1250 (500 Ca) MG tablet  cetirizine (ZYRTEC) 10 MG tablet  Cholecalciferol (VITAMIN D) 50 MCG (2000 UT) tablet  FLUoxetine (PROZAC) 40 MG capsule  Fluticasone-Salmeterol (ADVAIR) 250-50 MCG/DOSE AEPB  gabapentin (NEURONTIN) 300 MG capsule  guaiFENesin (MUCINEX) 600 MG 12 hr tablet  ipratropium (ATROVENT) 0.06 % nasal spray  latanoprost (XALATAN) 0.005 % ophthalmic solution  meloxicam (MOBIC) 15 MG tablet  metoprolol tartrate (LOPRESSOR) 25 MG tablet  montelukast (SINGULAIR) 10 MG tablet  Multiple Vitamins-Minerals (MULTIVITAMIN WITH MINERALS) tablet  nitroGLYCERIN (NITROSTAT) 0.4 MG SL tablet (Expired)  Omega-3 Fatty Acids (FISH OIL) 1000 MG CAPS  omeprazole (PRILOSEC) 20 MG capsule  Probiotic Product (PROBIOTIC DAILY PO)  rivaroxaban (XARELTO)  20 MG TABS tablet  rosuvastatin (CRESTOR) 10 MG tablet  Turmeric 500 MG CAPS  vitamin C (ASCORBIC ACID) 500 MG tablet   Medications self-administered by patient taken the night of the study : GABAPENTIN, TYLENOL, AMLODIPINE, ZYRTEC, METOPROLOL, SINGULAIR, xarelto  SLEEP ARCHITECTURE The study was initiated at 10:52:19 PM and ended at 5:13:31 AM.  Sleep onset time was 80.9 minutes and the sleep efficiency was 64.9%%. The total sleep time was 247.5 minutes.  Stage REM latency was 273.5 minutes.  The patient spent 20.4%% of the night in stage N1 sleep, 68.9%% in stage N2 sleep, 0.0%% in stage N3 and 10.7% in REM.  Alpha intrusion was absent.  Supine sleep was 31.31%.  RESPIRATORY PARAMETERS The overall apnea/hypopnea index (AHI) was 11.2 per hour. The respiratory disturbance index (RDI) was 19.9/h. There were 0 total apneas, including 0 obstructive, 0 central and 0 mixed apneas. There were 46 hypopneas and 36 RERAs.  The AHI during Stage REM sleep was 0.0 per hour.  AHI while supine was 30.2 per hour.  The mean oxygen saturation was 93.0%. The minimum SpO2 during sleep was 87.0%.  Moderate snoring was noted during this study.  CARDIAC DATA The 2 lead EKG demonstrated sinus rhythm. The mean heart rate was 68.1 beats per minute. Other EKG findings include: Atrial Fibrillation, PVCs.  LEG MOVEMENT DATA The total PLMS were 0 with a resulting PLMS index of 0.0. Associated arousal with leg movement index was 0.0 .  IMPRESSIONS - Mild obstructive sleep apnea occurred during this study (AHI 11.2/h: RDI 19.9/h); however, sleep apnea was severe during supine sleep (AHI 30.2/h). - Mild oxygen desaturation was noted during this study (Min O2 = 87.0%). - The patient snored with moderate snoring volume. - Abnormal sleep architecture with absent slow wave sleep,  prolonged latency to REM sleep and reduction in REM sleep. - EKG findings include Atrial Fibrillation, PVCs. - Clinically  significant periodic limb movements did not occur during sleep. No significant associated arousals.  DIAGNOSIS - Obstructive Sleep Apnea (G47.33) - Nocturnal Hypoxemia (G47.36)  RECOMMENDATIONS - Therapeutic CPAP titration to determine optimal pressure required to alleviate sleep disordered breathing. - Effort should be made to optimize nasal and oropharyngeal patency. - The patient should be counseled to avoid supine sleep; consider positional therapy. - Avoid alcohol, sedatives and other CNS depressants that may worsen sleep apnea and disrupt normal sleep architecture. - Sleep hygiene should be reviewed to assess factors that may improve sleep quality. - Weight management and regular exercise should be initiated or continued if appropriate.  [Electronically signed] 05/30/2020 08:38 AM  Shelva Majestic MD, [facility], [place], American Board of Sleep Medicine   NPI: [id number] [place] PH: [phone]   FX: [phone] [place]

## 2020-06-05 ENCOUNTER — Telehealth: Payer: Self-pay | Admitting: *Deleted

## 2020-06-05 ENCOUNTER — Other Ambulatory Visit: Payer: Self-pay | Admitting: Cardiovascular Disease

## 2020-06-05 DIAGNOSIS — M5416 Radiculopathy, lumbar region: Secondary | ICD-10-CM | POA: Diagnosis not present

## 2020-06-05 DIAGNOSIS — G4736 Sleep related hypoventilation in conditions classified elsewhere: Secondary | ICD-10-CM

## 2020-06-05 DIAGNOSIS — G4733 Obstructive sleep apnea (adult) (pediatric): Secondary | ICD-10-CM

## 2020-06-05 NOTE — Telephone Encounter (Signed)
Left message to return a call to discuss sleep study results and recommendations. 

## 2020-06-10 ENCOUNTER — Telehealth: Payer: Self-pay | Admitting: *Deleted

## 2020-06-11 ENCOUNTER — Telehealth: Payer: Self-pay | Admitting: *Deleted

## 2020-06-11 NOTE — Telephone Encounter (Signed)
Patient informed of sleep study results and recommendations. She agrees to proceed with CPAP titration study.

## 2020-06-11 NOTE — Telephone Encounter (Signed)
PA for CPAP titration submitted to [facility] via web portal.

## 2020-06-13 ENCOUNTER — Telehealth: Payer: Self-pay | Admitting: *Deleted

## 2020-06-13 NOTE — Telephone Encounter (Signed)
CPAP titration appointment left on VM.

## 2020-06-24 ENCOUNTER — Other Ambulatory Visit: Payer: Self-pay

## 2020-06-24 ENCOUNTER — Ambulatory Visit (HOSPITAL_COMMUNITY): Payer: Medicare HMO | Attending: Cardiology

## 2020-06-24 DIAGNOSIS — I4819 Other persistent atrial fibrillation: Secondary | ICD-10-CM | POA: Diagnosis not present

## 2020-06-24 LAB — ECHOCARDIOGRAM COMPLETE
Area-P 1/2: 4.54 cm2
P 1/2 time: 660 msec
S' Lateral: 3.2 cm

## 2020-06-27 DIAGNOSIS — M5416 Radiculopathy, lumbar region: Secondary | ICD-10-CM | POA: Diagnosis not present

## 2020-07-02 ENCOUNTER — Ambulatory Visit: Payer: Medicare HMO | Admitting: Cardiology

## 2020-07-02 NOTE — Progress Notes (Incomplete)
Electrophysiology Office Follow up Visit Note:    Date:  07/02/2020   ID:  Michelle Johnston, DOB 07-10-1950, MRN [id number]  PCP:  Mayra Neer, MD  [facility] Cardiologist:  None  [facility] Electrophysiologist:  Vickie Epley, MD    Interval History:    Michelle Johnston is a 70 y.o. female who presents for a follow up visit.  I last saw the patient May 21, 2020 for her atrial fibrillation.  She has previously been seen by Adline Peals in the A. fib clinic.  At the last appointment there was a question about whether or not she had severe valvular dysfunction so a repeat echo was planned.  The plan was that if the valvular disease was not severe, we would pursue ablation to manage her atrial fibrillation that was symptomatic.  She is previously intolerant to flecainide.  She had her repeat echocardiogram on June 24, 2020 which showed normal left ventricular function and mild MR and AR.    Past Medical History:  Diagnosis Date  . Arthritis   . Bradycardia   . Bronchitis   . Depression   . Glaucoma   . Hypertension   . Seasonal allergies   . Wears glasses     Past Surgical History:  Procedure Laterality Date  . ABDOMINAL HYSTERECTOMY  1990  . BREAST BIOPSY Right    benign  . CARDIOVERSION N/A 08/15/2019   Procedure: CARDIOVERSION;  Surgeon: Nigel Mormon, MD;  [facility];  Service: Cardiovascular;  Laterality: N/A;  . COLONOSCOPY    . meniscus tear knee     left knee  . NASAL SINUS SURGERY  1975  . ORIF ANKLE FRACTURE  2009   left  . TOTAL KNEE ARTHROPLASTY Left 02/03/2019   Procedure: LEFT TOTAL KNEE ARTHROPLASTY;  Surgeon: Mcarthur Rossetti, MD;  Location: [facility];  Service: Orthopedics;  Laterality: Left;  . TURBINATE REDUCTION Bilateral 06/04/2014   Procedure: BILATERAL TURBINATE REDUCTION;  Surgeon: Leta Baptist, MD;  Location: [place];  Service: ENT;  Laterality: Bilateral;  . TYMPANOSTOMY TUBE PLACEMENT     left    Current  Medications: No outpatient medications have been marked as taking for the 07/02/20 encounter (Appointment) with Vickie Epley, MD.     Allergies:   Ciprofibrate, Dexlansoprazole, Flecainide, Other, Ceclor [cefaclor], Ciprofloxacin, and Flagyl [metronidazole]   Social History   Socioeconomic History  . Marital status: Married    Spouse name: Not on file  . Number of children: 0  . Years of education: Not on file  . Highest education level: Not on file  Occupational History  . Not on file  Tobacco Use  . Smoking status: Former Smoker    Packs/day: 2.00    Years: 30.00    Pack years: 60.00    Types: Cigarettes    Quit date: 05/28/1998    Years since quitting: 22.1  . Smokeless tobacco: Never Used  Vaping Use  . Vaping Use: Never used  Substance and Sexual Activity  . Alcohol use: Yes    Alcohol/week: 3.0 standard drinks    Types: 2 Glasses of wine, 1 Cans of beer per week    Comment: daily-wine daily-1 glass  . Drug use: No  . Sexual activity: Not on file  Other Topics Concern  . Not on file  Social History Narrative  . Not on file   Social Determinants of Health   Financial Resource Strain: Not on file  Food Insecurity:  Not on file  Transportation Needs: Not on file  Physical Activity: Not on file  Stress: Not on file  Social Connections: Not on file     Family History: The patient's family history includes Hyperlipidemia in her mother; Hypertension in her mother; Lung cancer in her father; Lymphoma in her mother.  ROS:   Please see the history of present illness.    All other systems reviewed and are negative.  EKGs/Labs/Other Studies Reviewed:    The following studies were reviewed today:  June 24, 2020 echo personally reviewed Left ventricular function normal, 55% Right ventricular function normal Moderately dilated left atrium Severely dilated right atrium Mild AR Mild MR  EKG:  The ekg ordered today demonstrates ***  Recent Labs: 02/07/2020:  ALT 21; Hemoglobin 14.6; Platelets 265 03/27/2020: BUN 18; Creatinine, Ser 0.82; Magnesium 2.0; Potassium 4.3; Sodium 133  Recent Lipid Panel No results found for: CHOL, TRIG, HDL, CHOLHDL, VLDL, LDLCALC, LDLDIRECT  Physical Exam:    VS:  There were no vitals taken for this visit.    Wt Readings from Last 3 Encounters:  05/21/20 152 lb (68.9 kg)  05/14/20 150 lb (68 kg)  04/17/20 152 lb 3.2 oz (69 kg)     GEN: *** Well nourished, well developed in no acute distress HEENT: Normal NECK: No JVD; No carotid bruits LYMPHATICS: No lymphadenopathy CARDIAC: ***RRR, no murmurs, rubs, gallops RESPIRATORY:  Clear to auscultation without rales, wheezing or rhonchi  ABDOMEN: Soft, non-tender, non-distended MUSCULOSKELETAL:  No edema; No deformity  SKIN: Warm and dry NEUROLOGIC:  Alert and oriented x 3 PSYCHIATRIC:  Normal affect   ASSESSMENT:    1. Persistent atrial fibrillation ([place])   2. Mitral valve insufficiency, unspecified etiology   3. Essential hypertension   4. Obstructive sleep apnea (adult) (pediatric)    PLAN:    In order of problems listed above:  1. ***  Total time spent with patient today *** minutes. This includes reviewing records, evaluating the patient and coordinating care.   Medication Adjustments/Labs and Tests Ordered: Current medicines are reviewed at length with the patient today.  Concerns regarding medicines are outlined above.  No orders of the defined types were placed in this encounter.  No orders of the defined types were placed in this encounter.    Signed, Lars Mage, MD, [facility], [facility] 07/02/2020 10:47 AM    Electrophysiology [place]

## 2020-07-03 ENCOUNTER — Ambulatory Visit (HOSPITAL_BASED_OUTPATIENT_CLINIC_OR_DEPARTMENT_OTHER): Payer: Medicare HMO | Attending: Physical Medicine and Rehabilitation | Admitting: Physical Therapy

## 2020-07-03 ENCOUNTER — Other Ambulatory Visit: Payer: Self-pay

## 2020-07-03 ENCOUNTER — Encounter (HOSPITAL_BASED_OUTPATIENT_CLINIC_OR_DEPARTMENT_OTHER): Payer: Self-pay | Admitting: Physical Therapy

## 2020-07-03 DIAGNOSIS — R2689 Other abnormalities of gait and mobility: Secondary | ICD-10-CM | POA: Insufficient documentation

## 2020-07-03 DIAGNOSIS — M62830 Muscle spasm of back: Secondary | ICD-10-CM | POA: Insufficient documentation

## 2020-07-03 DIAGNOSIS — G8929 Other chronic pain: Secondary | ICD-10-CM | POA: Diagnosis not present

## 2020-07-03 DIAGNOSIS — M545 Low back pain, unspecified: Secondary | ICD-10-CM | POA: Diagnosis not present

## 2020-07-04 NOTE — Therapy (Signed)
[place], [place], [address] Phone: [phone]   Fax:  [phone]  Physical Therapy Evaluation  Patient Details  Name: Michelle Johnston MRN: [id number] Date of Birth: November 09, 1950 Referring Provider (PT): Dr Laroy Apple   Encounter Date: 07/03/2020   PT End of Session - 07/03/20 1655    Visit Number 1    Number of Visits 12    Date for PT Re-Evaluation 08/14/20    Authorization Type [employer]    PT Start Time 1300    PT Stop Time 1340    PT Time Calculation (min) 40 min    Activity Tolerance Patient tolerated treatment well    Behavior During Therapy [facility] for tasks assessed/performed           Past Medical History:  Diagnosis Date  . Arthritis   . Bradycardia   . Bronchitis   . Depression   . Glaucoma   . Hypertension   . Seasonal allergies   . Wears glasses     Past Surgical History:  Procedure Laterality Date  . ABDOMINAL HYSTERECTOMY  1990  . BREAST BIOPSY Right    benign  . CARDIOVERSION N/A 08/15/2019   Procedure: CARDIOVERSION;  Surgeon: Nigel Mormon, MD;  [facility];  Service: Cardiovascular;  Laterality: N/A;  . COLONOSCOPY    . meniscus tear knee     left knee  . NASAL SINUS SURGERY  1975  . ORIF ANKLE FRACTURE  2009   left  . TOTAL KNEE ARTHROPLASTY Left 02/03/2019   Procedure: LEFT TOTAL KNEE ARTHROPLASTY;  Surgeon: Mcarthur Rossetti, MD;  Location: [facility];  Service: Orthopedics;  Laterality: Left;  . TURBINATE REDUCTION Bilateral 06/04/2014   Procedure: BILATERAL TURBINATE REDUCTION;  Surgeon: Leta Baptist, MD;  Location: [place];  Service: ENT;  Laterality: Bilateral;  . TYMPANOSTOMY TUBE PLACEMENT     left    There were no vitals filed for this visit.    Subjective Assessment - 07/03/20 1309    Subjective Patient has a long histroy of lower back pain. She hashad pain for about 2-3 years that came on without a cause. She has a history  of OA in multiple joints. She had a knee replacement in 2020. She has    How long can you sit comfortably? limited sitting times    How long can you stand comfortably? >10 min    How long can you walk comfortably? longer distances increase her pain    Diagnostic tests X-ray:    Patient Stated Goals to have less pain    Currently in Pain? Yes    Pain Score 4     Pain Location Back    Pain Orientation Left;Right    Pain Descriptors / Indicators Aching    Pain Type Chronic pain    Pain Radiating Towards pain radiates down her legs into her knees    Aggravating Factors  walking and the evenings; can have some stiffness in the morning    Pain Relieving Factors rest settles it down; medication    Effect of Pain on Daily Activities difficulty performing her ADL's    Multiple Pain Sites No              [facility] PT Assessment - 07/04/20 0001      Assessment   Medical Diagnosis Spinal Stenosis    Referring Provider (PT) Dr Laroy Apple    Onset Date/Surgical Date --   2-3 years  prior   Hand Dominance Right    Next MD Visit Nothing scheduled    Prior Therapy Had therapy but soft tissue mobilization hurt the back significantly      Precautions   Precautions None      Restrictions   Weight Bearing Restrictions No      Balance Screen   Has the patient fallen in the past 6 months No    Has the patient had a decrease in activity level because of a fear of falling?  No    Is the patient reluctant to leave their home because of a fear of falling?  No      Home Environment   Living Environment Private residence    Additional Comments 2nd floor into the house. [place] going up steps      Prior Function   Level of Independence Independent    Vocation Retired    Therapist, nutritional   Overall Cognitive Status Within Functional Limits for tasks assessed    Attention Focused    Focused Attention Appears intact    Memory Appears intact    Awareness Appears intact     Problem Solving Appears intact      Observation/Other Assessments   Observations sits with upper body shifted to the left; right hip elevation.      Sensation   Light Touch Appears Intact      Coordination   Gross Motor Movements are Fluid and Coordinated Yes    Fine Motor Movements are Fluid and Coordinated Yes      ROM / Strength   AROM / PROM / Strength AROM;PROM;Strength      AROM   AROM Assessment Site Lumbar    Lumbar Flexion 55 with pain coming back to standing    Lumbar Extension painful    Lumbar - Right Side Bend mild pain    Lumbar - Left Side Bend mild pain    Lumbar - Right Rotation mild pain    Lumbar - Left Rotation mild pain      PROM   Overall PROM Comments full active passive ROM of bilateral hips      Strength   Strength Assessment Site Hip;Knee    Right/Left Hip Right;Left    Right Hip Flexion 4+/5    Right Hip ABduction 4+/5    Right Hip ADduction 4+/5    Left Hip Flexion 4+/5    Left Hip ABduction 4+/5    Left Hip ADduction 4+/5    Right/Left Knee Right;Left    Right Knee Flexion 5/5    Right Knee Extension 5/5    Left Knee Flexion 5/5    Left Knee Extension 5/5      Palpation   Palpation comment spasming in the lower back and into the gluteals      Ambulation/Gait   Gait Comments flexed posture with gait; right hip elevated; decreased hip flexion bilateral                      Objective measurements completed on examination: See above findings.       [facility] Adult PT Treatment/Exercise - 07/04/20 0001      Exercises   Exercises Lumbar      Lumbar Exercises: Stretches   Passive Hamstring Stretch Limitations seated hamstring stretch 3x20 sec    Piriformis Stretch Limitations 2x20 sec hold    Other Lumbar Stretch Exercise LTR x15 each side  PT Education - 07/04/20 1420    Education Details reviewed HEP and symptom mangement    Person(s) Educated Patient    Methods Explanation;Tactile  cues;Demonstration;Verbal cues    Comprehension Verbalized understanding;Returned demonstration;Verbal cues required;Tactile cues required            PT Short Term Goals - 07/04/20 1420      PT SHORT TERM GOAL #1   Title Patient will increase pain free lumbar flexion by 10 degrees    Time 3    Period Weeks    Status New    Target Date 07/25/20      PT SHORT TERM GOAL #2   Title Patient will demonstrate 5/5 gross LE strength    Time 3    Period Weeks    Status New    Target Date 07/25/20      PT SHORT TERM GOAL #3   Title Patient will stand for 15 minutes without self report of pain    Time 6    Period Weeks    Status New    Target Date 08/15/20             PT Long Term Goals - 07/04/20 1422      PT LONG TERM GOAL #1   Title Patient will stand for 30 min without self reported increase in pain in order to perfrom ADL's    Time 6    Period Weeks    Status New    Target Date 08/15/20      PT LONG TERM GOAL #2   Title Patient will be independnet with pool based strengthening and funtional mobility program    Time 6    Period Weeks    Status New    Target Date 08/15/20                  Plan - 07/03/20 1656    Clinical Impression Statement Patient is a 70 year old female who presents with long standing low back pain. Per patient she has a new diagnosis of scoliosis. She has had injections in the past which have helped her pain. She has increased pain when returning from lumbar flexion. She has good hip flexion and good hip strength consdering the amount  of spasming hse has in her low back and gluteals. She has had success with aquatic therapy in the past  but has not done well with land based programs. We begin with an aquatics program and hopefully get her on a program that she can use going forward if she can find accsess to a pool.    Personal Factors and Comorbidities Comorbidity 1;Time since onset of injury/illness/exacerbation;Comorbidity 2     Comorbidities knee OA, scoliosis    Examination-Activity Limitations Bend;Sleep;Squat;Stairs;Stand    Examination-Participation Restrictions Cleaning;Community Activity;Shop    Stability/Clinical Decision Making Evolving/Moderate complexity    Clinical Decision Making Moderate    Rehab Potential Excellent    PT Frequency 2x / week    PT Duration 6 weeks    PT Treatment/Interventions ADLs/Self Care Home Management;Electrical Stimulation;Cryotherapy;Gait training;Iontophoresis 4mg /ml Dexamethasone;Moist Heat;Traction;Ultrasound;Functional mobility training;Therapeutic activities;Therapeutic exercise;Cognitive remediation;Patient/family education;Manual techniques;Taping;Aquatic Therapy;Passive range of motion;Dry needling    PT Next Visit Plan begin pool therapy for core strengthening; stretching; and hip strengthening    PT Home Exercise Plan Access Code: [email or web address]  URL: [email or web address]  Date: 07/04/2020  Prepared by: Carolyne Littles    Exercises  Supine Piriformis Stretch with Foot on Ground - 1 x daily - 7 x  weekly - 3 sets - 3 reps - 20sec hold  Seated Hamstring Stretch - 1 x daily - 7 x weekly - 3 sets - 3 reps - 20sec hold  Supine Lower Trunk Rotation - 1 x daily - 7 x weekly - 3 sets - 10 reps    Consulted and Agree with Plan of Care Patient           Patient will benefit from skilled therapeutic intervention in order to improve the following deficits and impairments:  Abnormal gait,Increased fascial restricitons  Visit Diagnosis: Chronic bilateral low back pain without sciatica  Muscle spasm of back  Other abnormalities of gait and mobility   Referring diagnosis? Lumbar spondylosis and radicular pain M54.50 Treatment diagnosis? (if different than referring diagnosis) Lumbar spondylosis and radicular pain What was this (referring dx) caused by? []  Surgery []  Fall [x]  Ongoing issue []  Arthritis []  Other: ____________  Laterality: []  Rt []  Lt [x]   Both  Check all possible CPT codes:      []  97110 (Therapeutic Exercise)  []  92507 (SLP Treatment)  []  97112 (Neuro Re-ed)   []  92526 (Swallowing Treatment)   []  97116 (Gait Training)   []  [id number] (Cognitive Training, 1st 15 minutes) []  97140 (Manual Therapy)   []  97130 (Cognitive Training, each add'l 15 minutes)  []  97530 (Therapeutic Activities)  []  Other, List CPT Code ____________    []  [id number] (Self Care)       [x]  All codes above (97110 - 97535)  []  97012 (Mechanical Traction)  [x]  97014 (E-stim Unattended)  []  97032 (E-stim manual)  []  97033 (Ionto)  [x]  28413 (Ultrasound)  []  97760 (Orthotic Fit) []  [id number] (Physical Performance Training) [x]  [id number] (Aquatic Therapy) []  97034 (Contrast Bath) []  [id number] (Paraffin) []  97597 (Wound Care 1st 20 sq cm) []  97598 (Wound Care each add'l 20 sq cm) []  97016 (Vasopneumatic Device) []  [id number] (Orthotic Training) []  [id number] (Prosthetic Training)    Problem List Patient Active Problem List   Diagnosis Date Noted  . Persistent atrial fibrillation ([place]) 03/27/2020  . Secondary hypercoagulable state ([place]) 03/27/2020  . PAD (peripheral artery disease) ([place]) 12/27/2019  . Exertional chest pain 12/27/2019  . Primary osteoarthritis of right knee 07/12/2019  . Paroxysmal atrial fibrillation ([place]) 06/16/2019  . Nonrheumatic aortic valve insufficiency 06/16/2019  . Nonrheumatic mitral valve regurgitation 06/16/2019  . Tachycardia 02/20/2019  . Abnormal EKG 02/20/2019  . Essential hypertension 02/20/2019  . Status post total left knee replacement 02/03/2019  . Unilateral primary osteoarthritis, left knee 11/30/2016  . Chronic pain of left knee 11/30/2016    Carney Living PT DPT  07/04/2020, 2:25 PM  [facility] [address] [place], [place], [address] Phone: [phone]   Fax:  [phone]  Name: Michelle Johnston MRN: [id number] Date of Birth: Sep 13, 1950

## 2020-07-04 NOTE — Patient Instructions (Addendum)
Access Code: [email or web address] URL: [email or web address] Date: 07/04/2020 Prepared by: Carolyne Littles  Exercises Supine Piriformis Stretch with Foot on Ground - 1 x daily - 7 x weekly - 3 sets - 3 reps - 20sec hold Seated Hamstring Stretch - 1 x daily - 7 x weekly - 3 sets - 3 reps - 20sec hold Supine Lower Trunk Rotation - 1 x daily - 7 x weekly - 3 sets - 10 reps

## 2020-07-09 ENCOUNTER — Encounter (HOSPITAL_BASED_OUTPATIENT_CLINIC_OR_DEPARTMENT_OTHER): Payer: Self-pay | Admitting: Physical Therapy

## 2020-07-09 ENCOUNTER — Ambulatory Visit (HOSPITAL_BASED_OUTPATIENT_CLINIC_OR_DEPARTMENT_OTHER): Payer: Medicare HMO | Admitting: Physical Therapy

## 2020-07-09 ENCOUNTER — Other Ambulatory Visit: Payer: Self-pay

## 2020-07-09 DIAGNOSIS — M62830 Muscle spasm of back: Secondary | ICD-10-CM

## 2020-07-09 DIAGNOSIS — R2689 Other abnormalities of gait and mobility: Secondary | ICD-10-CM | POA: Diagnosis not present

## 2020-07-09 DIAGNOSIS — G8929 Other chronic pain: Secondary | ICD-10-CM

## 2020-07-09 DIAGNOSIS — M545 Low back pain, unspecified: Secondary | ICD-10-CM | POA: Diagnosis not present

## 2020-07-09 NOTE — Therapy (Signed)
[place], [place], [address] Phone: [phone]   Fax:  [phone]  Physical Therapy Treatment  Patient Details  Name: Michelle Johnston MRN: [id number] Date of Birth: 10-27-1950 Referring Provider (PT): Dr Laroy Apple   Encounter Date: 07/09/2020   PT End of Session - 07/09/20 1439    Visit Number 2    Number of Visits 12    Date for PT Re-Evaluation 08/14/20    Authorization Type [employer]    PT Start Time 2542    PT Stop Time 1117    PT Time Calculation (min) 36 min    Equipment Utilized During Treatment Other (comment)   water noodle/squoodle, bouyancy belt and cuffs, kickboard and barbells   Activity Tolerance Patient tolerated treatment well    Behavior During Therapy [facility] for tasks assessed/performed           Past Medical History:  Diagnosis Date  . Arthritis   . Bradycardia   . Bronchitis   . Depression   . Glaucoma   . Hypertension   . Seasonal allergies   . Wears glasses     Past Surgical History:  Procedure Laterality Date  . ABDOMINAL HYSTERECTOMY  1990  . BREAST BIOPSY Right    benign  . CARDIOVERSION N/A 08/15/2019   Procedure: CARDIOVERSION;  Surgeon: Nigel Mormon, MD;  [facility];  Service: Cardiovascular;  Laterality: N/A;  . COLONOSCOPY    . meniscus tear knee     left knee  . NASAL SINUS SURGERY  1975  . ORIF ANKLE FRACTURE  2009   left  . TOTAL KNEE ARTHROPLASTY Left 02/03/2019   Procedure: LEFT TOTAL KNEE ARTHROPLASTY;  Surgeon: Mcarthur Rossetti, MD;  Location: [facility];  Service: Orthopedics;  Laterality: Left;  . TURBINATE REDUCTION Bilateral 06/04/2014   Procedure: BILATERAL TURBINATE REDUCTION;  Surgeon: Leta Baptist, MD;  Location: [place];  Service: ENT;  Laterality: Bilateral;  . TYMPANOSTOMY TUBE PLACEMENT     left    There were no vitals filed for this visit.   Subjective Assessment - 07/09/20 1435    Subjective "my  whole body seems to hurt all the time and my balance isn't good today"              Pt seen for aquatic therapy today.  Treatment took place in water 3.25-4.8 ft in depth at the [facility] pool. Temp of water was 91.  Pt entered/exited the pool via stairs step to pattern independently with bilat rail. Warm up: forward, backward and side stepping/walking cues for increased step length, increased speed, hand placement to increase resistance. Gentle manual deep pressure to LB and glutes  Stretching: gastroc, hamstrings, glutes, IR/ER and LB on wall and sitting on bench of pool.  Using buoyancy belt, kick board, noodles and sqoodles, pt is directed in LE and core strengthening  ex: add/abd, knee lifts and rotation planks. Balance static and dynamic: planks static and walking, knee lift with manual perturbations Pt requires buoyancy for support and to offload joints with strengthening exercises. Viscosity of the water is needed for resistance of strengthening; water current perturbations provides challenge to standing balance unsupported, requiring increased core activation.                        PT Education - 07/09/20 1437    Education Details instructed pt on use of heating pad for symptom relief on low  setting and not falling asleep on to avoid burns.  Post pelvic tilts to be competed in sup for a gentle LB stretch.    Person(s) Educated Patient    Methods Explanation    Comprehension Verbalized understanding;Returned demonstration            PT Short Term Goals - 07/04/20 1420      PT SHORT TERM GOAL #1   Title Patient will increase pain free lumbar flexion by 10 degrees    Time 3    Period Weeks    Status New    Target Date 07/25/20      PT SHORT TERM GOAL #2   Title Patient will demonstrate 5/5 gross LE strength    Time 3    Period Weeks    Status New    Target Date 07/25/20      PT SHORT TERM GOAL #3   Title Patient will stand for 15  minutes without self report of pain    Time 6    Period Weeks    Status New    Target Date 08/15/20             PT Long Term Goals - 07/04/20 1422      PT LONG TERM GOAL #1   Title Patient will stand for 30 min without self reported increase in pain in order to perfrom ADL's    Time 6    Period Weeks    Status New    Target Date 08/15/20      PT LONG TERM GOAL #2   Title Patient will be independnet with pool based strengthening and funtional mobility program    Time 6    Period Weeks    Status New    Target Date 08/15/20                 Plan - 07/09/20 1441    Clinical Impression Statement Pt demonstrates confidence in aquatic setting.  She reports approx 10-15 into session that her pain had begun to decrease and felt comfortable exercising and stretching in pool. Responds well to manual pressure and massage in warm environement She participates with enthusiasm immediately benifitting from treatment    Personal Factors and Comorbidities Comorbidity 1;Time since onset of injury/illness/exacerbation;Comorbidity 2    Comorbidities knee OA, scoliosis    Examination-Activity Limitations Bend;Sleep;Squat;Stairs;Stand    Examination-Participation Restrictions Cleaning;Community Activity;Shop    Stability/Clinical Decision Making Evolving/Moderate complexity    Clinical Decision Making Moderate    Rehab Potential Excellent    PT Frequency 2x / week    PT Treatment/Interventions ADLs/Self Care Home Management;Electrical Stimulation;Cryotherapy;Gait training;Iontophoresis 4mg /ml Dexamethasone;Moist Heat;Traction;Ultrasound;Functional mobility training;Therapeutic activities;Therapeutic exercise;Cognitive remediation;Patient/family education;Manual techniques;Taping;Aquatic Therapy;Passive range of motion;Dry needling    PT Next Visit Plan advance stretching of LB and hips.    PT Home Exercise Plan Access Code: [email or web address]  URL: [email or web address]  Date: 07/04/2020   Prepared by: Carolyne Littles    Exercises  Supine Piriformis Stretch with Foot on Ground - 1 x daily - 7 x weekly - 3 sets - 3 reps - 20sec hold  Seated Hamstring Stretch - 1 x daily - 7 x weekly - 3 sets - 3 reps - 20sec hold  Supine Lower Trunk Rotation - 1 x daily - 7 x weekly - 3 sets - 10 reps    Consulted and Agree with Plan of Care Patient           Patient will benefit from skilled therapeutic intervention in  order to improve the following deficits and impairments:  Abnormal gait,Increased fascial restricitons  Visit Diagnosis: Chronic bilateral low back pain without sciatica  Muscle spasm of back  Other abnormalities of gait and mobility     Problem List Patient Active Problem List   Diagnosis Date Noted  . Persistent atrial fibrillation ([place]) 03/27/2020  . Secondary hypercoagulable state ([place]) 03/27/2020  . PAD (peripheral artery disease) ([place]) 12/27/2019  . Exertional chest pain 12/27/2019  . Primary osteoarthritis of right knee 07/12/2019  . Paroxysmal atrial fibrillation ([place]) 06/16/2019  . Nonrheumatic aortic valve insufficiency 06/16/2019  . Nonrheumatic mitral valve regurgitation 06/16/2019  . Tachycardia 02/20/2019  . Abnormal EKG 02/20/2019  . Essential hypertension 02/20/2019  . Status post total left knee replacement 02/03/2019  . Unilateral primary osteoarthritis, left knee 11/30/2016  . Chronic pain of left knee 11/30/2016    Vedia Pereyra 07/09/2020, 2:52 PM  [facility] [address] [place], [place], [address] Phone: [phone]   Fax:  [phone]  Name: Michelle Johnston MRN: [id number] Date of Birth: 1951-02-15

## 2020-07-10 DIAGNOSIS — D6869 Other thrombophilia: Secondary | ICD-10-CM | POA: Diagnosis not present

## 2020-07-10 DIAGNOSIS — M4800 Spinal stenosis, site unspecified: Secondary | ICD-10-CM | POA: Diagnosis not present

## 2020-07-10 DIAGNOSIS — I4891 Unspecified atrial fibrillation: Secondary | ICD-10-CM | POA: Diagnosis not present

## 2020-07-10 DIAGNOSIS — J42 Unspecified chronic bronchitis: Secondary | ICD-10-CM | POA: Diagnosis not present

## 2020-07-10 DIAGNOSIS — L989 Disorder of the skin and subcutaneous tissue, unspecified: Secondary | ICD-10-CM | POA: Diagnosis not present

## 2020-07-10 DIAGNOSIS — I351 Nonrheumatic aortic (valve) insufficiency: Secondary | ICD-10-CM | POA: Diagnosis not present

## 2020-07-12 DIAGNOSIS — C44319 Basal cell carcinoma of skin of other parts of face: Secondary | ICD-10-CM | POA: Diagnosis not present

## 2020-07-12 DIAGNOSIS — D485 Neoplasm of uncertain behavior of skin: Secondary | ICD-10-CM | POA: Diagnosis not present

## 2020-07-15 ENCOUNTER — Ambulatory Visit: Payer: Medicare HMO | Admitting: Physician Assistant

## 2020-07-15 ENCOUNTER — Encounter: Payer: Self-pay | Admitting: Physician Assistant

## 2020-07-15 ENCOUNTER — Ambulatory Visit (INDEPENDENT_AMBULATORY_CARE_PROVIDER_SITE_OTHER): Payer: Medicare HMO

## 2020-07-15 DIAGNOSIS — M1711 Unilateral primary osteoarthritis, right knee: Secondary | ICD-10-CM

## 2020-07-15 MED ORDER — LIDOCAINE HCL 1 % IJ SOLN
5.0000 mL | INTRAMUSCULAR | Status: AC | PRN
  Administered 2020-07-15: 5 mL

## 2020-07-15 MED ORDER — METHYLPREDNISOLONE ACETATE 40 MG/ML IJ SUSP
40.0000 mg | INTRAMUSCULAR | Status: AC | PRN
  Administered 2020-07-15: 40 mg via INTRA_ARTICULAR

## 2020-07-15 MED ORDER — LIDOCAINE HCL 1 % IJ SOLN
3.0000 mL | INTRAMUSCULAR | Status: AC | PRN
  Administered 2020-07-15: 3 mL

## 2020-07-15 NOTE — Progress Notes (Signed)
Office Visit Note   Patient: Michelle Johnston           Date of Birth: May 04, 1950           MRN: [id number] Visit Date: 07/15/2020              Requested by: Mayra Neer, MD [address] [place],  [place] [address] PCP: Mayra Neer, MD   Assessment & Plan: Visit Diagnoses:  1. Primary osteoarthritis of right knee     Plan: We will try to gain approval for supplemental injection of the right knee.  Again patient does not want to undergo right total knee replacement.  Questions encouraged and answered at length.  We will see her back once approval of the supplemental injections has been received.  Follow-Up Instructions: Return for Supplemental injection.   Orders:  Orders Placed This Encounter  Procedures  . Large Joint Inj  . XR Knee 1-2 Views Right   No orders of the defined types were placed in this encounter.     Procedures: Large Joint Inj: R knee on 07/15/2020 6:11 PM Indications: pain Details: 22 G 1.5 in needle, superolateral approach  Arthrogram: No  Medications: 3 mL lidocaine 1 %; 40 mg methylPREDNISolone acetate 40 MG/ML; 5 mL lidocaine 1 % Aspirate: 50 mL yellow Outcome: tolerated well, no immediate complications Procedure, treatment alternatives, risks and benefits explained, specific risks discussed. Consent was given by the patient. Immediately prior to procedure a time out was called to verify the correct patient, procedure, equipment, support staff and site/side marked as required. Patient was prepped and draped in the usual sterile fashion.       Clinical Data: No additional findings.   Subjective: Chief Complaint  Patient presents with  . Right Knee - Pain    HPI Michelle Johnston returns today due to right knee pain.  She states the pain in her knee is been present for at least last 3 months no known injury.  She notes swelling in the knee.  She had an injection 03/14/2020 which gave her some relief.  She is status post left total  knee arthroplasty which was done well.  However she is not interested in any type of surgical intervention.  She denies any fevers or chills.  No recent vaccines.  She is nondiabetic.  Review of Systems Please see HPI otherwise negative  Objective: Vital Signs: There were no vitals taken for this visit.  Physical Exam Constitutional:      Appearance: She is not ill-appearing or diaphoretic.  Neurological:     Mental Status: She is alert and oriented to person, place, and time.  Psychiatric:        Mood and Affect: Mood normal.     Ortho Exam Left knee good range of motion without pain.  No instability valgus varus stressing.  No abnormal warmth erythema or effusion left knee.  Well-healed surgical incision over the left knee. Right knee no abnormal warmth.  Erythema.  Positive effusion.  No instability with valgus varus stressing.  Significant crepitus with passive range of motion of the knee.  No instability valgus varus stressing. Specialty Comments:  No specialty comments available.  Imaging: XR Knee 1-2 Views Right  Result Date: 07/15/2020 Right knee 2 views: No acute fracture.  Moderate narrowing lateral joint line.  Medial joint line is well-maintained.  Mild patellofemoral changes.  No dislocation subluxation.    PMFS History: Patient Active Problem List   Diagnosis Date Noted  .  Persistent atrial fibrillation ([place]) 03/27/2020  . Secondary hypercoagulable state ([place]) 03/27/2020  . PAD (peripheral artery disease) ([place]) 12/27/2019  . Exertional chest pain 12/27/2019  . Primary osteoarthritis of right knee 07/12/2019  . Paroxysmal atrial fibrillation ([place]) 06/16/2019  . Nonrheumatic aortic valve insufficiency 06/16/2019  . Nonrheumatic mitral valve regurgitation 06/16/2019  . Tachycardia 02/20/2019  . Abnormal EKG 02/20/2019  . Essential hypertension 02/20/2019  . Status post total left knee replacement 02/03/2019  . Unilateral primary osteoarthritis, left knee  11/30/2016  . Chronic pain of left knee 11/30/2016   Past Medical History:  Diagnosis Date  . Arthritis   . Bradycardia   . Bronchitis   . Depression   . Glaucoma   . Hypertension   . Seasonal allergies   . Wears glasses     Family History  Problem Relation Age of Onset  . Hypertension Mother   . Hyperlipidemia Mother   . Lymphoma Mother   . Lung cancer Father     Past Surgical History:  Procedure Laterality Date  . ABDOMINAL HYSTERECTOMY  1990  . BREAST BIOPSY Right    benign  . CARDIOVERSION N/A 08/15/2019   Procedure: CARDIOVERSION;  Surgeon: Nigel Mormon, MD;  [facility];  Service: Cardiovascular;  Laterality: N/A;  . COLONOSCOPY    . meniscus tear knee     left knee  . NASAL SINUS SURGERY  1975  . ORIF ANKLE FRACTURE  2009   left  . TOTAL KNEE ARTHROPLASTY Left 02/03/2019   Procedure: LEFT TOTAL KNEE ARTHROPLASTY;  Surgeon: Mcarthur Rossetti, MD;  Location: [facility];  Service: Orthopedics;  Laterality: Left;  . TURBINATE REDUCTION Bilateral 06/04/2014   Procedure: BILATERAL TURBINATE REDUCTION;  Surgeon: Leta Baptist, MD;  Location: [place];  Service: ENT;  Laterality: Bilateral;  . TYMPANOSTOMY TUBE PLACEMENT     left   Social History   Occupational History  . Not on file  Tobacco Use  . Smoking status: Former Smoker    Packs/day: 2.00    Years: 30.00    Pack years: 60.00    Types: Cigarettes    Quit date: 05/28/1998    Years since quitting: 22.1  . Smokeless tobacco: Never Used  Vaping Use  . Vaping Use: Never used  Substance and Sexual Activity  . Alcohol use: Yes    Alcohol/week: 3.0 standard drinks    Types: 2 Glasses of wine, 1 Cans of beer per week    Comment: daily-wine daily-1 glass  . Drug use: No  . Sexual activity: Not on file

## 2020-07-16 ENCOUNTER — Telehealth: Payer: Self-pay

## 2020-07-16 NOTE — Telephone Encounter (Signed)
Please get auth for right knee gel injection-gil pt

## 2020-07-18 ENCOUNTER — Other Ambulatory Visit: Payer: Self-pay

## 2020-07-18 ENCOUNTER — Ambulatory Visit (HOSPITAL_BASED_OUTPATIENT_CLINIC_OR_DEPARTMENT_OTHER): Payer: Medicare HMO | Admitting: Physical Therapy

## 2020-07-18 DIAGNOSIS — M545 Low back pain, unspecified: Secondary | ICD-10-CM | POA: Diagnosis not present

## 2020-07-18 DIAGNOSIS — G8929 Other chronic pain: Secondary | ICD-10-CM

## 2020-07-18 DIAGNOSIS — R2689 Other abnormalities of gait and mobility: Secondary | ICD-10-CM | POA: Diagnosis not present

## 2020-07-18 DIAGNOSIS — M62830 Muscle spasm of back: Secondary | ICD-10-CM

## 2020-07-19 ENCOUNTER — Encounter (HOSPITAL_BASED_OUTPATIENT_CLINIC_OR_DEPARTMENT_OTHER): Payer: Self-pay | Admitting: Physical Therapy

## 2020-07-19 NOTE — Telephone Encounter (Signed)
Noted  

## 2020-07-19 NOTE — Therapy (Signed)
[facility] [place], [place], [address] Phone: [phone]   Fax:  [phone]  Physical Therapy Treatment  Patient Details  Name: Michelle Johnston MRN: [id number] Date of Birth: [birth year] Referring Provider (PT): Dr Laroy Apple   Encounter Date: 07/18/2020   PT End of Session - 07/19/20 0801    Visit Number 3    Number of Visits 12    Date for PT Re-Evaluation 08/14/20    Authorization Type [employer]    PT Start Time 7062    PT Stop Time 1226    PT Time Calculation (min) 41 min    Activity Tolerance Patient tolerated treatment well    Behavior During Therapy [facility] for tasks assessed/performed           Past Medical History:  Diagnosis Date  . Arthritis   . Bradycardia   . Bronchitis   . Depression   . Glaucoma   . Hypertension   . Seasonal allergies   . Wears glasses     Past Surgical History:  Procedure Laterality Date  . ABDOMINAL HYSTERECTOMY  1990  . BREAST BIOPSY Right    benign  . CARDIOVERSION N/A 08/15/2019   Procedure: CARDIOVERSION;  Surgeon: Nigel Mormon, MD;  [facility];  Service: Cardiovascular;  Laterality: N/A;  . COLONOSCOPY    . meniscus tear knee     left knee  . NASAL SINUS SURGERY  1975  . ORIF ANKLE FRACTURE  2009   left  . TOTAL KNEE ARTHROPLASTY Left 02/03/2019   Procedure: LEFT TOTAL KNEE ARTHROPLASTY;  Surgeon: Mcarthur Rossetti, MD;  Location: [facility];  Service: Orthopedics;  Laterality: Left;  . TURBINATE REDUCTION Bilateral 06/04/2014   Procedure: BILATERAL TURBINATE REDUCTION;  Surgeon: Leta Baptist, MD;  Location: [place];  Service: ENT;  Laterality: Bilateral;  . TYMPANOSTOMY TUBE PLACEMENT     left    There were no vitals filed for this visit.   Subjective Assessment - 07/19/20 0753    Subjective Patient reports her left knee has been swollen. She went the MD and had another shot. She feels like the shot helped. She has felt  the pool strengthening is helping.    How long can you sit comfortably? limited sitting times    How long can you stand comfortably? >10 min    How long can you walk comfortably? longer distances increase her pain    Diagnostic tests X-ray:    Patient Stated Goals to have less pain    Currently in Pain? Yes    Pain Score 4     Pain Location Knee    Pain Orientation Right    Pain Descriptors / Indicators Aching    Pain Type Chronic pain    Pain Radiating Towards pain radiating int    Pain Onset More than a month ago    Pain Frequency Constant    Aggravating Factors  standing and walking    Pain Relieving Factors rest settles it down, medication    Effect of Pain on Daily Activities difficulty perfroming her ADL's    Multiple Pain Sites No                      Pt seen for aquatic therapy today.  Treatment took place in water 3.25-4 ft in depth at the [employer] pool. Temp of water was 91.  Pt entered/exited the pool via stairs (step through pattern) independently with  bilat rail.   Warm up: heel/toe walking x4 laps across pool chest deep side stepping x4 laps from shallow to deep;   Exercises; Slow march x20; hip 3 way x20; squats x20; hip extension x20; hip abduction x20; Sit to stand x20;  board trunk flexion x10 lateral board rotation x10 each way seated Standing weight shift x20    Long strides in the pool at chest depth   LAQ x20 each direction      Pt requires buoyancy for support and to offload joints with strengthening exercises. Viscosity of the water is needed for resistance of strengthening; water current perturbations provides challenge to standing balance unsupported, requiring increased core activation.               PT Education - 07/19/20 0800    Education Details reviewed various water exercsies    Person(s) Educated Patient    Methods Demonstration;Tactile cues;Verbal cues;Explanation    Comprehension Verbalized  understanding;Returned demonstration;Verbal cues required;Tactile cues required            PT Short Term Goals - 07/04/20 1420      PT SHORT TERM GOAL #1   Title Patient will increase pain free lumbar flexion by 10 degrees    Time 3    Period Weeks    Status New    Target Date 07/25/20      PT SHORT TERM GOAL #2   Title Patient will demonstrate 5/5 gross LE strength    Time 3    Period Weeks    Status New    Target Date 07/25/20      PT SHORT TERM GOAL #3   Title Patient will stand for 15 minutes without self report of pain    Time 6    Period Weeks    Status New    Target Date 08/15/20             PT Long Term Goals - 07/04/20 1422      PT LONG TERM GOAL #1   Title Patient will stand for 30 min without self reported increase in pain in order to perfrom ADL's    Time 6    Period Weeks    Status New    Target Date 08/15/20      PT LONG TERM GOAL #2   Title Patient will be independnet with pool based strengthening and funtional mobility program    Time 6    Period Weeks    Status New    Target Date 08/15/20                 Plan - 07/19/20 0925    Clinical Impression Statement Depsite knee pain the patient was able to tolerate treatment. She perfromed standing exercises for hip strengthening and balance exercises. We will continue to progress as tolerated.    Examination-Activity Limitations Bend;Sleep;Squat;Stairs;Stand    Examination-Participation Restrictions Cleaning;Community Activity;Shop    Stability/Clinical Decision Making Evolving/Moderate complexity    Clinical Decision Making Moderate    Rehab Potential Excellent    PT Frequency 2x / week    PT Duration 6 weeks    PT Treatment/Interventions ADLs/Self Care Home Management;Electrical Stimulation;Cryotherapy;Gait training;Iontophoresis 4mg /ml Dexamethasone;Moist Heat;Traction;Ultrasound;Functional mobility training;Therapeutic activities;Therapeutic exercise;Cognitive  remediation;Patient/family education;Manual techniques;Taping;Aquatic Therapy;Passive range of motion;Dry needling    PT Next Visit Plan advance stretching of LB and hips.    PT Home Exercise Plan Access Code: [email or web address]  URL: [email or web address]  Date: 07/04/2020  Prepared by: Carolyne Littles  Exercises  Supine Piriformis Stretch with Foot on Ground - 1 x daily - 7 x weekly - 3 sets - 3 reps - 20sec hold  Seated Hamstring Stretch - 1 x daily - 7 x weekly - 3 sets - 3 reps - 20sec hold  Supine Lower Trunk Rotation - 1 x daily - 7 x weekly - 3 sets - 10 reps    Consulted and Agree with Plan of Care Patient           Patient will benefit from skilled therapeutic intervention in order to improve the following deficits and impairments:  Abnormal gait,Increased fascial restricitons  Visit Diagnosis: Chronic bilateral low back pain without sciatica  Muscle spasm of back  Other abnormalities of gait and mobility     Problem List Patient Active Problem List   Diagnosis Date Noted  . Persistent atrial fibrillation ([place]) 03/27/2020  . Secondary hypercoagulable state ([place]) 03/27/2020  . PAD (peripheral artery disease) ([place]) 12/27/2019  . Exertional chest pain 12/27/2019  . Primary osteoarthritis of right knee 07/12/2019  . Paroxysmal atrial fibrillation ([place]) 06/16/2019  . Nonrheumatic aortic valve insufficiency 06/16/2019  . Nonrheumatic mitral valve regurgitation 06/16/2019  . Tachycardia 02/20/2019  . Abnormal EKG 02/20/2019  . Essential hypertension 02/20/2019  . Status post total left knee replacement 02/03/2019  . Unilateral primary osteoarthritis, left knee 11/30/2016  . Chronic pain of left knee 11/30/2016    Carney Living PT DPT  07/19/2020, 9:29 AM  [facility] [place], [place], [address] Phone: [phone]   Fax:  [phone]  Name: MASHONDA BROSKI MRN: [id number] Date of Birth:  October 04, 1950

## 2020-07-22 NOTE — Progress Notes (Signed)
Electrophysiology Office Follow up Visit Note:    Date:  07/23/2020   ID:  Michelle Johnston, DOB 06/09/1950, MRN [id number]  PCP:  Shaw, Kimberlee, MD  [facility] Electrophysiologist:  Deb Loudin T Andrienne Havener, MD    Interval History:    Michelle Johnston is a 70 y.o. female who presents for a follow up visit.  I last saw the patient May 21, 2020 for her persistent atrial fibrillation. At that appointment we decided to order an echocardiogram to assess for any structural heart disease before finalizing a plan.  She had her echo on June 24, 2020.  The echo showed normal left ventricular function and mild AI/MR.     Past Medical History:  Diagnosis Date  . Arthritis   . Bradycardia   . Bronchitis   . Depression   . Glaucoma   . Hypertension   . Seasonal allergies   . Wears glasses     Past Surgical History:  Procedure Laterality Date  . ABDOMINAL HYSTERECTOMY  1990  . BREAST BIOPSY Right    benign  . CARDIOVERSION N/A 08/15/2019   Procedure: CARDIOVERSION;  Surgeon: Patwardhan, Manish J, MD;  [facility];  Service: Cardiovascular;  Laterality: N/A;  . COLONOSCOPY    . meniscus tear knee     left knee  . NASAL SINUS SURGERY  1975  . ORIF ANKLE FRACTURE  2009   left  . TOTAL KNEE ARTHROPLASTY Left 02/03/2019   Procedure: LEFT TOTAL KNEE ARTHROPLASTY;  Surgeon: Blackman, Christopher Y, MD;  Location: [facility];  Service: Orthopedics;  Laterality: Left;  . TURBINATE REDUCTION Bilateral 06/04/2014   Procedure: BILATERAL TURBINATE REDUCTION;  Surgeon: Su Teoh, MD;  Location: [facility];  Service: ENT;  Laterality: Bilateral;  . TYMPANOSTOMY TUBE PLACEMENT     left    Current Medications: Current Meds  Medication Sig  . acetaminophen (TYLENOL) 500 MG tablet Take 1,000 mg by mouth every 6 (six) hours as needed. pain  . albuterol (VENTOLIN HFA) 108 (90 Base) MCG/ACT inhaler Inhale 1-2 puffs into the lungs every 6 (six) hours as needed for wheezing or shortness of  breath.  . amLODipine (NORVASC) 5 MG tablet Take 5 mg by mouth at bedtime.   . b complex vitamins tablet Take 1 tablet by mouth daily.  . buPROPion (WELLBUTRIN XL) 300 MG 24 hr tablet 150 mg.  . calcium carbonate (OS-CAL - DOSED IN MG OF ELEMENTAL CALCIUM) 1250 (500 Ca) MG tablet Take 1 tablet by mouth daily with breakfast.  . cetirizine (ZYRTEC) 10 MG tablet Take 10 mg by mouth at bedtime.  . Cholecalciferol (VITAMIN D) 50 MCG (2000 UT) tablet 1 tablet  . FLUoxetine (PROZAC) 40 MG capsule   . Fluticasone-Salmeterol (ADVAIR) 250-50 MCG/DOSE AEPB Inhale 1 puff into the lungs 2 (two) times daily as needed (asthma).  . gabapentin (NEURONTIN) 300 MG capsule Take by mouth 3 (three) times daily.  . guaiFENesin (MUCINEX) 600 MG 12 hr tablet 1 tablet for chest congestion  . ipratropium (ATROVENT) 0.06 % nasal spray as needed.  . latanoprost (XALATAN) 0.005 % ophthalmic solution Place 1 drop into both eyes every morning.   . meloxicam (MOBIC) 15 MG tablet as needed.  . montelukast (SINGULAIR) 10 MG tablet Take 10 mg by mouth at bedtime.  . Multiple Vitamins-Minerals (MULTIVITAMIN WITH MINERALS) tablet Take 1 tablet by mouth daily.  . Omega-3 Fatty Acids (FISH OIL) 1000 MG CAPS Take 1,000 mg by mouth daily.  . omeprazole (PRILOSEC) 20   MG capsule as needed.  . Probiotic Product (PROBIOTIC DAILY PO) Take 1 capsule by mouth daily.  . rivaroxaban (XARELTO) 20 MG TABS tablet Take 1 tablet (20 mg total) by mouth daily with supper.  . Turmeric 500 MG CAPS Take 500 mg by mouth daily.  . vitamin C (ASCORBIC ACID) 500 MG tablet Take 500 mg by mouth daily.     Allergies:   Ciprofibrate, Dexlansoprazole, Flecainide, Other, Ceclor [cefaclor], Ciprofloxacin, and Flagyl [metronidazole]   Social History   Socioeconomic History  . Marital status: Married    Spouse name: Not on file  . Number of children: 0  . Years of education: Not on file  . Highest education level: Not on file  Occupational History  . Not  on file  Tobacco Use  . Smoking status: Former Smoker    Packs/day: 2.00    Years: 30.00    Pack years: 60.00    Types: Cigarettes    Quit date: 05/28/1998    Years since quitting: 22.1  . Smokeless tobacco: Never Used  Vaping Use  . Vaping Use: Never used  Substance and Sexual Activity  . Alcohol use: Yes    Alcohol/week: 3.0 standard drinks    Types: 2 Glasses of wine, 1 Cans of beer per week    Comment: daily-wine daily-1 glass  . Drug use: No  . Sexual activity: Not on file  Other Topics Concern  . Not on file  Social History Narrative  . Not on file   Social Determinants of Health   Financial Resource Strain: Not on file  Food Insecurity: Not on file  Transportation Needs: Not on file  Physical Activity: Not on file  Stress: Not on file  Social Connections: Not on file     Family History: The patient's family history includes Hyperlipidemia in her mother; Hypertension in her mother; Lung cancer in her father; Lymphoma in her mother.  ROS:   Please see the history of present illness.    All other systems reviewed and are negative.  EKGs/Labs/Other Studies Reviewed:    The following studies were reviewed today:  June 24, 2020 echo personally reviewed Left ventricular function normal, 55% Right ventricular function normal Moderately dilated left atrium Mild MR Mild AI     EKG:  The ekg ordered today demonstrates atrial fibrillation, incomplete right bundle-branch block, left anterior fascicular block  Recent Labs: 02/07/2020: ALT 21; Hemoglobin 14.6; Platelets 265 03/27/2020: BUN 18; Creatinine, Ser 0.82; Magnesium 2.0; Potassium 4.3; Sodium 133  Recent Lipid Panel No results found for: CHOL, TRIG, HDL, CHOLHDL, VLDL, LDLCALC, LDLDIRECT  Physical Exam:    VS:  BP 100/70   Pulse (!) 54   Ht 5\' 5"  (1.651 m)   Wt 151 lb 9.6 oz (68.8 kg)   SpO2 97%   BMI 25.23 kg/m     Wt Readings from Last 3 Encounters:  07/23/20 151 lb 9.6 oz (68.8 kg)   05/21/20 152 lb (68.9 kg)  05/14/20 150 lb (68 kg)     GEN:  Well nourished, well developed in no acute distress HEENT: Normal NECK: No JVD; No carotid bruits LYMPHATICS: No lymphadenopathy CARDIAC: Irregularly irregular, no murmurs, rubs, gallops RESPIRATORY:  Clear to auscultation without rales, wheezing or rhonchi  ABDOMEN: Soft, non-tender, non-distended MUSCULOSKELETAL:  No edema; No deformity  SKIN: Warm and dry NEUROLOGIC:  Alert and oriented x 3 PSYCHIATRIC:  Normal affect   ASSESSMENT:    1. Persistent atrial fibrillation ([place])   2. Essential  hypertension   3. Obstructive sleep apnea (adult) (pediatric)    PLAN:    In order of problems listed above:  1. Persistent atrial fibrillation On Xarelto for stroke prophylaxis.  Symptomatic with generalized fatigue when she is out of rhythm.  She would like to pursue rhythm control which I think is very reasonable.  Given her baseline conduction disease, I do not think she is good candidate for class Ic's.  I did discuss antiarrhythmic therapy and ablation today with the patient.  I have previously discussed this in clinic as well.  After discussion she would like to pursue ablation therapy.  I discussed the risks and expected recovery time as well as the expected success rate of a persistent atrial fibrillation ablation.  We will plan for a transesophageal echocardiogram prior to the procedure to exclude left atrial thrombus and assess left atrial anatomy.   Risk, benefits, and alternatives to EP study and radiofrequency ablation for afib were also discussed in detail today. These risks include but are not limited to stroke, bleeding, vascular damage, tamponade, perforation, damage to the esophagus, lungs, and other structures, pulmonary vein stenosis, worsening renal function, and death. The patient understands these risk and wishes to proceed.  We will therefore proceed with catheter ablation at the next available time.  Carto,  ICE, anesthesia are requested for the procedure.  Will also obtain TEE protocol prior to the procedure to exclude LAA thrombus and further evaluate atrial anatomy.   2.  Hypertension Controlled.  Continue home meds.  3.  OSA   Total time spent with patient today 32 minutes. This includes reviewing records, evaluating the patient and coordinating care.   Medication Adjustments/Labs and Tests Ordered: Current medicines are reviewed at length with the patient today.  Concerns regarding medicines are outlined above.  Orders Placed This Encounter  Procedures  . EKG 12-Lead   No orders of the defined types were placed in this encounter.    Signed, Amaani Guilbault, MD, FACC, FHRS 07/23/2020 8:29 AM    Electrophysiology [facility] 

## 2020-07-22 NOTE — H&P (View-Only) (Signed)
Electrophysiology Office Follow up Visit Note:    Date:  07/23/2020   ID:  Michelle Johnston, DOB 03/14/1950, MRN [id number]  PCP:  Mayra Neer, MD  [facility] Electrophysiologist:  Vickie Epley, MD    Interval History:    Michelle Johnston is a 70 y.o. female who presents for a follow up visit.  I last saw the patient May 21, 2020 for her persistent atrial fibrillation. At that appointment we decided to order an echocardiogram to assess for any structural heart disease before finalizing a plan.  She had her echo on June 24, 2020.  The echo showed normal left ventricular function and mild AI/MR.     Past Medical History:  Diagnosis Date  . Arthritis   . Bradycardia   . Bronchitis   . Depression   . Glaucoma   . Hypertension   . Seasonal allergies   . Wears glasses     Past Surgical History:  Procedure Laterality Date  . ABDOMINAL HYSTERECTOMY  1990  . BREAST BIOPSY Right    benign  . CARDIOVERSION N/A 08/15/2019   Procedure: CARDIOVERSION;  Surgeon: Nigel Mormon, MD;  [facility];  Service: Cardiovascular;  Laterality: N/A;  . COLONOSCOPY    . meniscus tear knee     left knee  . NASAL SINUS SURGERY  1975  . ORIF ANKLE FRACTURE  2009   left  . TOTAL KNEE ARTHROPLASTY Left 02/03/2019   Procedure: LEFT TOTAL KNEE ARTHROPLASTY;  Surgeon: Mcarthur Rossetti, MD;  Location: [facility];  Service: Orthopedics;  Laterality: Left;  . TURBINATE REDUCTION Bilateral 06/04/2014   Procedure: BILATERAL TURBINATE REDUCTION;  Surgeon: Leta Baptist, MD;  Location: [place];  Service: ENT;  Laterality: Bilateral;  . TYMPANOSTOMY TUBE PLACEMENT     left    Current Medications: Current Meds  Medication Sig  . acetaminophen (TYLENOL) 500 MG tablet Take 1,000 mg by mouth every 6 (six) hours as needed. pain  . albuterol (VENTOLIN HFA) 108 (90 Base) MCG/ACT inhaler Inhale 1-2 puffs into the lungs every 6 (six) hours as needed for wheezing or shortness of  breath.  Marland Kitchen amLODipine (NORVASC) 5 MG tablet Take 5 mg by mouth at bedtime.   Marland Kitchen b complex vitamins tablet Take 1 tablet by mouth daily.  Marland Kitchen buPROPion (WELLBUTRIN XL) 300 MG 24 hr tablet 150 mg.  . calcium carbonate (OS-CAL - DOSED IN MG OF ELEMENTAL CALCIUM) 1250 (500 Ca) MG tablet Take 1 tablet by mouth daily with breakfast.  . cetirizine (ZYRTEC) 10 MG tablet Take 10 mg by mouth at bedtime.  . Cholecalciferol (VITAMIN D) 50 MCG (2000 UT) tablet 1 tablet  . FLUoxetine (PROZAC) 40 MG capsule   . Fluticasone-Salmeterol (ADVAIR) 250-50 MCG/DOSE AEPB Inhale 1 puff into the lungs 2 (two) times daily as needed (asthma).  . gabapentin (NEURONTIN) 300 MG capsule Take by mouth 3 (three) times daily.  Marland Kitchen guaiFENesin (MUCINEX) 600 MG 12 hr tablet 1 tablet for chest congestion  . ipratropium (ATROVENT) 0.06 % nasal spray as needed.  . latanoprost (XALATAN) 0.005 % ophthalmic solution Place 1 drop into both eyes every morning.   . meloxicam (MOBIC) 15 MG tablet as needed.  . montelukast (SINGULAIR) 10 MG tablet Take 10 mg by mouth at bedtime.  . Multiple Vitamins-Minerals (MULTIVITAMIN WITH MINERALS) tablet Take 1 tablet by mouth daily.  . Omega-3 Fatty Acids (FISH OIL) 1000 MG CAPS Take 1,000 mg by mouth daily.  Marland Kitchen omeprazole (PRILOSEC) 20  MG capsule as needed.  . Probiotic Product (PROBIOTIC DAILY PO) Take 1 capsule by mouth daily.  . rivaroxaban (XARELTO) 20 MG TABS tablet Take 1 tablet (20 mg total) by mouth daily with supper.  . Turmeric 500 MG CAPS Take 500 mg by mouth daily.  . vitamin C (ASCORBIC ACID) 500 MG tablet Take 500 mg by mouth daily.     Allergies:   Ciprofibrate, Dexlansoprazole, Flecainide, Other, Ceclor [cefaclor], Ciprofloxacin, and Flagyl [metronidazole]   Social History   Socioeconomic History  . Marital status: Married    Spouse name: Not on file  . Number of children: 0  . Years of education: Not on file  . Highest education level: Not on file  Occupational History  . Not  on file  Tobacco Use  . Smoking status: Former Smoker    Packs/day: 2.00    Years: 30.00    Pack years: 60.00    Types: Cigarettes    Quit date: 05/28/1998    Years since quitting: 22.1  . Smokeless tobacco: Never Used  Vaping Use  . Vaping Use: Never used  Substance and Sexual Activity  . Alcohol use: Yes    Alcohol/week: 3.0 standard drinks    Types: 2 Glasses of wine, 1 Cans of beer per week    Comment: daily-wine daily-1 glass  . Drug use: No  . Sexual activity: Not on file  Other Topics Concern  . Not on file  Social History Narrative  . Not on file   Social Determinants of Health   Financial Resource Strain: Not on file  Food Insecurity: Not on file  Transportation Needs: Not on file  Physical Activity: Not on file  Stress: Not on file  Social Connections: Not on file     Family History: The patient's family history includes Hyperlipidemia in her mother; Hypertension in her mother; Lung cancer in her father; Lymphoma in her mother.  ROS:   Please see the history of present illness.    All other systems reviewed and are negative.  EKGs/Labs/Other Studies Reviewed:    The following studies were reviewed today:  June 24, 2020 echo personally reviewed Left ventricular function normal, 55% Right ventricular function normal Moderately dilated left atrium Mild MR Mild AI     EKG:  The ekg ordered today demonstrates atrial fibrillation, incomplete right bundle-branch block, left anterior fascicular block  Recent Labs: 02/07/2020: ALT 21; Hemoglobin 14.6; Platelets 265 03/27/2020: BUN 18; Creatinine, Ser 0.82; Magnesium 2.0; Potassium 4.3; Sodium 133  Recent Lipid Panel No results found for: CHOL, TRIG, HDL, CHOLHDL, VLDL, LDLCALC, LDLDIRECT  Physical Exam:    VS:  BP 100/70   Pulse (!) 54   Ht 5\' 5"  (1.651 m)   Wt 151 lb 9.6 oz (68.8 kg)   SpO2 97%   BMI 25.23 kg/m     Wt Readings from Last 3 Encounters:  07/23/20 151 lb 9.6 oz (68.8 kg)   05/21/20 152 lb (68.9 kg)  05/14/20 150 lb (68 kg)     GEN:  Well nourished, well developed in no acute distress HEENT: Normal NECK: No JVD; No carotid bruits LYMPHATICS: No lymphadenopathy CARDIAC: Irregularly irregular, no murmurs, rubs, gallops RESPIRATORY:  Clear to auscultation without rales, wheezing or rhonchi  ABDOMEN: Soft, non-tender, non-distended MUSCULOSKELETAL:  No edema; No deformity  SKIN: Warm and dry NEUROLOGIC:  Alert and oriented x 3 PSYCHIATRIC:  Normal affect   ASSESSMENT:    1. Persistent atrial fibrillation ([place])   2. Essential  hypertension   3. Obstructive sleep apnea (adult) (pediatric)    PLAN:    In order of problems listed above:  1. Persistent atrial fibrillation On Xarelto for stroke prophylaxis.  Symptomatic with generalized fatigue when she is out of rhythm.  She would like to pursue rhythm control which I think is very reasonable.  Given her baseline conduction disease, I do not think she is good candidate for class Ic's.  I did discuss antiarrhythmic therapy and ablation today with the patient.  I have previously discussed this in clinic as well.  After discussion she would like to pursue ablation therapy.  I discussed the risks and expected recovery time as well as the expected success rate of a persistent atrial fibrillation ablation.  We will plan for a transesophageal echocardiogram prior to the procedure to exclude left atrial thrombus and assess left atrial anatomy.   Risk, benefits, and alternatives to EP study and radiofrequency ablation for afib were also discussed in detail today. These risks include but are not limited to stroke, bleeding, vascular damage, tamponade, perforation, damage to the esophagus, lungs, and other structures, pulmonary vein stenosis, worsening renal function, and death. The patient understands these risk and wishes to proceed.  We will therefore proceed with catheter ablation at the next available time.  Carto,  ICE, anesthesia are requested for the procedure.  Will also obtain TEE protocol prior to the procedure to exclude LAA thrombus and further evaluate atrial anatomy.   2.  Hypertension Controlled.  Continue home meds.  3.  OSA   Total time spent with patient today 32 minutes. This includes reviewing records, evaluating the patient and coordinating care.   Medication Adjustments/Labs and Tests Ordered: Current medicines are reviewed at length with the patient today.  Concerns regarding medicines are outlined above.  Orders Placed This Encounter  Procedures  . EKG 12-Lead   No orders of the defined types were placed in this encounter.    Signed, Lars Mage, MD, [facility], [facility] 07/23/2020 8:29 AM    Electrophysiology [facility]

## 2020-07-23 ENCOUNTER — Other Ambulatory Visit: Payer: Self-pay

## 2020-07-23 ENCOUNTER — Encounter: Payer: Self-pay | Admitting: Cardiology

## 2020-07-23 ENCOUNTER — Encounter: Payer: Self-pay | Admitting: *Deleted

## 2020-07-23 ENCOUNTER — Ambulatory Visit: Payer: Medicare HMO | Admitting: Cardiology

## 2020-07-23 VITALS — BP 100/70 | HR 54 | Ht 65.0 in | Wt 151.6 lb

## 2020-07-23 DIAGNOSIS — G4733 Obstructive sleep apnea (adult) (pediatric): Secondary | ICD-10-CM | POA: Diagnosis not present

## 2020-07-23 DIAGNOSIS — I4819 Other persistent atrial fibrillation: Secondary | ICD-10-CM | POA: Diagnosis not present

## 2020-07-23 DIAGNOSIS — I10 Essential (primary) hypertension: Secondary | ICD-10-CM | POA: Diagnosis not present

## 2020-07-23 NOTE — Patient Instructions (Addendum)
Medication Instructions:  Your physician recommends that you continue on your current medications as directed. Please refer to the Current Medication list given to you today.  Labwork: None ordered.  Testing/Procedures: Your physician has requested that you have a TEE. During a TEE, sound waves are used to create images of your heart. It provides your doctor with information about the size and shape of your heart and how well your heart's chambers and valves are working. In this test, a transducer is attached to the end of a flexible tube that's guided down your throat and into your esophagus (the tube leading from you mouth to your stomach) to get a more detailed image of your heart. You are not awake for the procedure. Please see the instruction sheet given to you today. For further information please visit [email or web address].  Your physician has recommended that you have an ablation. Catheter ablation is a medical procedure used to treat some cardiac arrhythmias (irregular heartbeats). During catheter ablation, a long, thin, flexible tube is put into a blood vessel in your groin (upper thigh), or neck. This tube is called an ablation catheter. It is then guided to your heart through the blood vessel. Radio frequency waves destroy small areas of heart tissue where abnormal heartbeats may cause an arrhythmia to start. Please see the instruction sheet given to you today.    Any Other Special Instructions Will Be Listed Below (If Applicable).  If you need a refill on your cardiac medications before your next appointment, please call your pharmacy.    Cardiac Ablation Cardiac ablation is a procedure to destroy (ablate) some heart tissue that is sending bad signals. These bad signals cause problems in heart rhythm. The heart has many areas that make these signals. If there are problems in these areas, they can make the heart beat in a way that is not normal. Destroying some tissues can help make  the heart rhythm normal. Tell your doctor about:  Any allergies you have.  All medicines you are taking. These include vitamins, herbs, eye drops, creams, and over-the-counter medicines.  Any problems you or family members have had with medicines that make you fall asleep (anesthetics).  Any blood disorders you have.  Any surgeries you have had.  Any medical conditions you have, such as kidney failure.  Whether you are pregnant or may be pregnant. What are the risks? This is a safe procedure. But problems may occur, including:  Infection.  Bruising and bleeding.  Bleeding into the chest.  Stroke or blood clots.  Damage to nearby areas of your body.  Allergies to medicines or dyes.  The need for a pacemaker if the normal system is damaged.  Failure of the procedure to treat the problem. What happens before the procedure? Medicines Ask your doctor about:  Changing or stopping your normal medicines. This is important.  Taking aspirin and ibuprofen. Do not take these medicines unless your doctor tells you to take them.  Taking other medicines, vitamins, herbs, and supplements. General instructions  Follow instructions from your doctor about what you cannot eat or drink.  Plan to have someone take you home from the hospital or clinic.  If you will be going home right after the procedure, plan to have someone with you for 24 hours.  Ask your doctor what steps will be taken to prevent infection. What happens during the procedure?  An IV tube will be put into one of your veins.  You will be given a medicine to  help you relax.  The skin on your neck or groin will be numbed.  A cut (incision) will be made in your neck or groin. A needle will be put through your cut and into a large vein.  A tube (catheter) will be put into the needle. The tube will be moved to your heart.  Dye may be put through the tube. This helps your doctor see your heart.  Small devices  (electrodes) on the tube will send out signals.  A type of energy will be used to destroy some heart tissue.  The tube will be taken out.  Pressure will be held on your cut. This helps stop bleeding.  A bandage will be put over your cut. The exact procedure may vary among doctors and hospitals.   What happens after the procedure?  You will be watched until you leave the hospital or clinic. This includes checking your heart rate, breathing rate, oxygen, and blood pressure.  Your cut will be watched for bleeding. You will need to lie still for a few hours.  Do not drive for 24 hours or as long as your doctor tells you. Summary  Cardiac ablation is a procedure to destroy some heart tissue. This is done to treat heart rhythm problems.  Tell your doctor about any medical conditions you may have. Tell him or her about all medicines you are taking to treat them.  This is a safe procedure. But problems may occur. These include infection, bruising, bleeding, and damage to nearby areas of your body.  Follow what your doctor tells you about food and drink. You may also be told to change or stop some of your medicines.  After the procedure, do not drive for 24 hours or as long as your doctor tells you. This information is not intended to replace advice given to you by your health care provider. Make sure you discuss any questions you have with your health care provider. Document Revised: 01/19/2019 Document Reviewed: 01/19/2019 Elsevier Patient Education  2021 [employer].

## 2020-07-24 ENCOUNTER — Ambulatory Visit (HOSPITAL_BASED_OUTPATIENT_CLINIC_OR_DEPARTMENT_OTHER): Payer: Medicare HMO | Admitting: Physical Therapy

## 2020-07-26 ENCOUNTER — Encounter (HOSPITAL_COMMUNITY): Payer: Self-pay

## 2020-07-26 ENCOUNTER — Telehealth (HOSPITAL_COMMUNITY): Payer: Self-pay | Admitting: Emergency Medicine

## 2020-07-26 ENCOUNTER — Other Ambulatory Visit (HOSPITAL_COMMUNITY): Payer: Self-pay | Admitting: Emergency Medicine

## 2020-07-26 ENCOUNTER — Other Ambulatory Visit (HOSPITAL_COMMUNITY): Payer: Self-pay | Admitting: Cardiology

## 2020-07-26 ENCOUNTER — Ambulatory Visit (HOSPITAL_BASED_OUTPATIENT_CLINIC_OR_DEPARTMENT_OTHER): Payer: Medicare HMO | Admitting: Physical Therapy

## 2020-07-26 ENCOUNTER — Other Ambulatory Visit: Payer: Self-pay | Admitting: Cardiology

## 2020-07-26 DIAGNOSIS — I4891 Unspecified atrial fibrillation: Secondary | ICD-10-CM

## 2020-07-26 DIAGNOSIS — I4819 Other persistent atrial fibrillation: Secondary | ICD-10-CM

## 2020-07-26 NOTE — Progress Notes (Signed)
error 

## 2020-07-26 NOTE — Telephone Encounter (Signed)
Calling patient to inform her that I sent CT instructions to her mychart account and also requesting to set up lab appt prior to CT. Unable to reach patient so LMTCB Marchia Bond RN Navigator Cardiac Imaging [facility] [phone] Office  [phone] Cell

## 2020-07-26 NOTE — Telephone Encounter (Signed)
Pt returning phone call.  I explained why we scheduled her for PV CTA and will be cancelling the TEE. She verbalized understanding.  I asked that she come in for labs prior to CTA and she will be coming on wed 6/1.  I also informed her that I sent a copy of CT instructions to her mychart account to review and I will be in touch with her later next week to go over the instructions so she doesn't have questions.  Pt appreciated the call.  Marchia Bond RN Navigator Cardiac Imaging [facility] [phone] Office  [phone] Cell

## 2020-07-26 NOTE — Progress Notes (Signed)
Labs ordered for CTA

## 2020-07-30 ENCOUNTER — Other Ambulatory Visit: Payer: Self-pay

## 2020-07-30 ENCOUNTER — Encounter (HOSPITAL_BASED_OUTPATIENT_CLINIC_OR_DEPARTMENT_OTHER): Payer: Self-pay | Admitting: Physical Therapy

## 2020-07-30 ENCOUNTER — Ambulatory Visit (HOSPITAL_BASED_OUTPATIENT_CLINIC_OR_DEPARTMENT_OTHER): Payer: Medicare HMO | Admitting: Physical Therapy

## 2020-07-30 DIAGNOSIS — M545 Low back pain, unspecified: Secondary | ICD-10-CM

## 2020-07-30 DIAGNOSIS — G8929 Other chronic pain: Secondary | ICD-10-CM

## 2020-07-30 DIAGNOSIS — R2689 Other abnormalities of gait and mobility: Secondary | ICD-10-CM

## 2020-07-30 DIAGNOSIS — M62830 Muscle spasm of back: Secondary | ICD-10-CM | POA: Diagnosis not present

## 2020-07-30 DIAGNOSIS — I4819 Other persistent atrial fibrillation: Secondary | ICD-10-CM

## 2020-07-30 NOTE — Addendum Note (Signed)
Addended by: Willeen Cass A on: 07/30/2020 01:26 PM   Modules accepted: Orders

## 2020-07-30 NOTE — Therapy (Addendum)
[facility] [address] [place], [place], [address] Phone: [phone]   Fax:  [phone]  Physical Therapy Treatment/Discharge   Patient Details  Name: Michelle Johnston MRN: [id number] Date of Birth: May 12, 1950 Referring Provider (PT): Dr Laroy Apple  Subjective no pain feeling better Pain: right knee 1/10, chronic, aching, improves with rest.  Encounter Date: 07/30/2020     Past Medical History:  Diagnosis Date   Arthritis    Bradycardia    Bronchitis    Depression    Glaucoma    Hypertension    Seasonal allergies    Wears glasses     Past Surgical History:  Procedure Laterality Date   ABDOMINAL HYSTERECTOMY  1990   ATRIAL FIBRILLATION ABLATION N/A 08/12/2020   Procedure: ATRIAL FIBRILLATION ABLATION;  Surgeon: Vickie Epley, MD;  Location: [place] [facility];  Service: Cardiovascular;  Laterality: N/A;   BREAST BIOPSY Right    benign   CARDIOVERSION N/A 08/15/2019   Procedure: CARDIOVERSION;  Surgeon: Nigel Mormon, MD;  Location: [facility];  Service: Cardiovascular;  Laterality: N/A;   COLONOSCOPY     meniscus tear knee     left knee   NASAL SINUS SURGERY  1975   ORIF ANKLE FRACTURE  2009   left   TOTAL KNEE ARTHROPLASTY Left 02/03/2019   Procedure: LEFT TOTAL KNEE ARTHROPLASTY;  Surgeon: Mcarthur Rossetti, MD;  Location: [facility];  Service: Orthopedics;  Laterality: Left;   TURBINATE REDUCTION Bilateral 06/04/2014   Procedure: BILATERAL TURBINATE REDUCTION;  Surgeon: Leta Baptist, MD;  Location: [facility];  Service: ENT;  Laterality: Bilateral;   TYMPANOSTOMY TUBE PLACEMENT     left    There were no vitals filed for this visit.  TREATMENT Pt seen for aquatic therapy today.  Treatment took place in water 3.25-4.8 ft in depth at the [employer] [facility] pool. Temp of water was 91.  Pt entered/exited the pool via stairs step through pattern independently with bilat rail. Warm up:  forward, backward and side stepping/walking cues for increased step length, increased speed, hand placement to increase resistance. Stretching: hamstring, gastroc and LB leaning up against pool using noodle under knee activating posterior core with post pelvic tilt, knee extension and DF 2x10.  Hip flex noodle under ankle forward facing pool extending leg back x 10 reps Strengthening and Balance: noodle pushdown foot progressing holding to wall then supported by kickboards 3x10 bilat.  Progressed further to kickboard pushdowns.   Aerobic capacity training.  Supported by noodles intervals of 15 then 20 secs kicking to speed to toleration with 30-60 sec rest period x 5 mins. Pt requires buoyancy for support and to offload joints with strengthening exercises. Viscosity of the water is needed for resistance of strengthening; water current perturbations provides challenge to standing balance unsupported, requiring increased core activation.                                PT Short Term Goals - 07/30/20 1213       PT SHORT TERM GOAL #2   Status Achieved      PT SHORT TERM GOAL #3   Status Achieved               PT Long Term Goals - 07/04/20 1422       PT LONG TERM GOAL #1   Title Patient will stand for 30 min without self reported  increase in pain in order to perfrom ADL's    Time 6    Period Weeks    Status New    Target Date 08/15/20      PT LONG TERM GOAL #2   Title Patient will be independnet with pool based strengthening and funtional mobility program    Time 6    Period Weeks    Status New    Target Date 08/15/20           Clinical Assessment Pt reporting no pain in knee or LB today. She is having a cardiac ablasion which is schedued for June 10. She is anticipating return to therapy in a few weeks. She has demonstrated improvement in pain and toletrion to activity as evidenced by dereased overall pain with increased strength and balance (with  noodle pushdowns unsupported and wihout LOB). Pt tolerated interval training today.         Patient will benefit from skilled therapeutic intervention in order to improve the following deficits and impairments:  Abnormal gait, Increased fascial restricitons  Visit Diagnosis: Chronic bilateral low back pain without sciatica  Muscle spasm of back  Other abnormalities of gait and mobility   PHYSICAL THERAPY DISCHARGE SUMMARY  Visits from Start of Care: 4  Current functional level related to goals / functional outcomes: Improved pain    Remaining deficits: Pain in her knees at times   Education / Equipment:    Patient agrees to discharge. Patient goals were not met. Patient is being discharged due to cardiac procedure   Problem List Patient Active Problem List   Diagnosis Date Noted   Persistent atrial fibrillation ([place]) 03/27/2020   Secondary hypercoagulable state ([place]) 03/27/2020   PAD (peripheral artery disease) ([place]) 12/27/2019   Exertional chest pain 12/27/2019   Primary osteoarthritis of right knee 07/12/2019   Paroxysmal atrial fibrillation ([place]) 06/16/2019   Nonrheumatic aortic valve insufficiency 06/16/2019   Nonrheumatic mitral valve regurgitation 06/16/2019   Tachycardia 02/20/2019   Abnormal EKG 02/20/2019   Essential hypertension 02/20/2019   Status post total left knee replacement 02/03/2019   Unilateral primary osteoarthritis, left knee 11/30/2016   Chronic pain of left knee 11/30/2016    Macky Lower Olan Kurek MPT 09/10/2020, 4:00 PM  [facility] [place], [place], [address] Phone: [phone]   Fax:  [phone]  Name: Michelle Johnston MRN: [id number] Date of Birth: 10/05/1950

## 2020-07-31 ENCOUNTER — Other Ambulatory Visit: Payer: Medicare HMO

## 2020-07-31 DIAGNOSIS — I4819 Other persistent atrial fibrillation: Secondary | ICD-10-CM

## 2020-07-31 LAB — CBC WITH DIFFERENTIAL/PLATELET
Basophils Absolute: 0 10*3/uL (ref 0.0–0.2)
Basos: 1 %
EOS (ABSOLUTE): 0.2 10*3/uL (ref 0.0–0.4)
Eos: 5 %
Hematocrit: 38.2 % (ref 34.0–46.6)
Hemoglobin: 12.6 g/dL (ref 11.1–15.9)
Immature Grans (Abs): 0 10*3/uL (ref 0.0–0.1)
Immature Granulocytes: 0 %
Lymphocytes Absolute: 1.1 10*3/uL (ref 0.7–3.1)
Lymphs: 30 %
MCH: 33.5 pg — ABNORMAL HIGH (ref 26.6–33.0)
MCHC: 33 g/dL (ref 31.5–35.7)
MCV: 102 fL — ABNORMAL HIGH (ref 79–97)
Monocytes Absolute: 0.5 10*3/uL (ref 0.1–0.9)
Monocytes: 13 %
Neutrophils Absolute: 1.8 10*3/uL (ref 1.4–7.0)
Neutrophils: 51 %
Platelets: 254 10*3/uL (ref 150–450)
RBC: 3.76 x10E6/uL — ABNORMAL LOW (ref 3.77–5.28)
RDW: 11.7 % (ref 11.7–15.4)
WBC: 3.5 10*3/uL (ref 3.4–10.8)

## 2020-07-31 LAB — BASIC METABOLIC PANEL
BUN/Creatinine Ratio: 19 (ref 12–28)
BUN: 16 mg/dL (ref 8–27)
CO2: 24 mmol/L (ref 20–29)
Calcium: 9.4 mg/dL (ref 8.7–10.3)
Chloride: 98 mmol/L (ref 96–106)
Creatinine, Ser: 0.83 mg/dL (ref 0.57–1.00)
Glucose: 88 mg/dL (ref 65–99)
Potassium: 4.5 mmol/L (ref 3.5–5.2)
Sodium: 136 mmol/L (ref 134–144)
eGFR: 75 mL/min/{1.73_m2} (ref >59)

## 2020-08-01 ENCOUNTER — Ambulatory Visit (HOSPITAL_BASED_OUTPATIENT_CLINIC_OR_DEPARTMENT_OTHER): Payer: Medicare HMO | Attending: Physical Medicine and Rehabilitation | Admitting: Physical Therapy

## 2020-08-01 DIAGNOSIS — M62830 Muscle spasm of back: Secondary | ICD-10-CM | POA: Insufficient documentation

## 2020-08-01 DIAGNOSIS — R2689 Other abnormalities of gait and mobility: Secondary | ICD-10-CM | POA: Insufficient documentation

## 2020-08-01 DIAGNOSIS — M545 Low back pain, unspecified: Secondary | ICD-10-CM | POA: Insufficient documentation

## 2020-08-01 DIAGNOSIS — G8929 Other chronic pain: Secondary | ICD-10-CM | POA: Insufficient documentation

## 2020-08-02 ENCOUNTER — Telehealth (HOSPITAL_COMMUNITY): Payer: Self-pay | Admitting: Emergency Medicine

## 2020-08-02 NOTE — Telephone Encounter (Signed)
Reaching out to patient to offer assistance regarding upcoming cardiac imaging study; pt verbalizes understanding of appt date/time, parking situation and where to check in, pre-test NPO status and medications ordered, and verified current allergies; name and call back number provided for further questions should they arise Marchia Bond RN Navigator Cardiac Imaging [facility] [phone] office [phone] cell  Pt taking meds per usual, holding albuterol inhaler, zyrtec, and singulair  Michelle Johnston

## 2020-08-05 ENCOUNTER — Ambulatory Visit (HOSPITAL_COMMUNITY)
Admission: RE | Admit: 2020-08-05 | Discharge: 2020-08-05 | Disposition: A | Discharge status: Home / Self Care | Payer: Medicare HMO | Source: Ambulatory Visit | Attending: Cardiology | Admitting: Cardiology

## 2020-08-05 ENCOUNTER — Encounter (HOSPITAL_COMMUNITY): Payer: Self-pay

## 2020-08-05 ENCOUNTER — Other Ambulatory Visit: Payer: Self-pay

## 2020-08-05 DIAGNOSIS — I4891 Unspecified atrial fibrillation: Secondary | ICD-10-CM

## 2020-08-05 MED ORDER — IOHEXOL 350 MG/ML SOLN
80.0000 mL | Freq: Once | INTRAVENOUS | Status: AC | PRN
  Administered 2020-08-05: 80 mL via INTRAVENOUS

## 2020-08-06 ENCOUNTER — Telehealth: Payer: Self-pay | Admitting: *Deleted

## 2020-08-06 ENCOUNTER — Ambulatory Visit (HOSPITAL_BASED_OUTPATIENT_CLINIC_OR_DEPARTMENT_OTHER): Payer: Medicare HMO | Admitting: Physical Therapy

## 2020-08-06 NOTE — Telephone Encounter (Signed)
Rescheduled upcoming procedure to 08/22/20. Patient in agreement and verbalized understanding.

## 2020-08-08 ENCOUNTER — Encounter (HOSPITAL_COMMUNITY): Payer: Medicare HMO

## 2020-08-08 ENCOUNTER — Ambulatory Visit (HOSPITAL_BASED_OUTPATIENT_CLINIC_OR_DEPARTMENT_OTHER): Payer: Medicare HMO | Admitting: Physical Therapy

## 2020-08-08 ENCOUNTER — Telehealth: Payer: Self-pay

## 2020-08-08 ENCOUNTER — Ambulatory Visit (HOSPITAL_COMMUNITY): Admit: 2020-08-08 | Payer: Medicare HMO | Admitting: Internal Medicine

## 2020-08-08 SURGERY — ECHOCARDIOGRAM, TRANSESOPHAGEAL
Anesthesia: Monitor Anesthesia Care

## 2020-08-08 NOTE — Telephone Encounter (Signed)
Outreach made to Michelle Johnston.  Offered June 13 at 7:30 am for afib ablation.  Michelle Johnston in agreement.  She will call to reschedule her Cpap titration at [phone]

## 2020-08-09 NOTE — Pre-Procedure Instructions (Signed)
Instructed patient on the following items: Arrival time 0530 Nothing to eat or drink after midnight No meds AM of procedure Responsible person to drive you home and stay with you for 24 hrs  Have you missed any doses of anti-coagulant Xarelto- hasn't missed

## 2020-08-11 NOTE — Anesthesia Preprocedure Evaluation (Addendum)
Anesthesia Evaluation  Patient identified by MRN, date of birth, ID band Patient awake    Reviewed: Allergy & Precautions, NPO status , Patient's Chart, lab work & pertinent test results  Airway Mallampati: II  TM Distance: >3 FB Neck ROM: Full    Dental no notable dental hx. (+) Teeth Intact, Caps, Dental Advisory Given   Pulmonary asthma ,  COPD inhaler, former smoker,    Pulmonary exam normal breath sounds clear to auscultation       Cardiovascular hypertension, Pt. on medications and Pt. on home beta blockers + Peripheral Vascular Disease  Normal cardiovascular exam+ dysrhythmias Atrial Fibrillation and Supra Ventricular Tachycardia + Valvular Problems/Murmurs AI and MR  Rhythm:Regular Rate:Normal  ECHO 5/22  1. Left ventricular ejection fraction, by estimation, is 55 to 60%. The  left ventricle has normal function. The left ventricle has no regional  wall motion abnormalities. There is mild left ventricular hypertrophy.  Left ventricular diastolic parameters  are indeterminate.  2. Right ventricular systolic function is normal. The right ventricular  size is normal. There is normal pulmonary artery systolic pressure. The  estimated right ventricular systolic pressure is 58.5 mmHg.  3. Left atrial size was moderately dilated.  4. Right atrial size was severely dilated.  5. The mitral valve is normal in structure. Mild mitral valve  regurgitation.  6. The aortic valve is tricuspid. Aortic valve regurgitation is mild. No  aortic stenosis is present.  7. Aortic dilatation noted. There is mild dilatation of the ascending  aorta, measuring 39 mm.  8. The inferior vena cava is normal in size with greater than 50%  respiratory variability, suggesting right atrial pressure of 3 mmHg.    Neuro/Psych PSYCHIATRIC DISORDERS Depression negative neurological ROS     GI/Hepatic negative GI ROS, Neg liver ROS,   Endo/Other   negative endocrine ROS  Renal/GU negative Renal ROS  negative genitourinary   Musculoskeletal  (+) Arthritis , Osteoarthritis,    Abdominal   Peds negative pediatric ROS (+)  Hematology negative hematology ROS (+)   Anesthesia Other Findings   Reproductive/Obstetrics negative OB ROS                           Anesthesia Physical  Anesthesia Plan  ASA: 3  Anesthesia Plan: General   Post-op Pain Management:    Induction: Intravenous  PONV Risk Score and Plan: 3 and Treatment may vary due to age or medical condition, Ondansetron and Dexamethasone  Airway Management Planned: Oral ETT and LMA  Additional Equipment: None  Intra-op Plan:   Post-operative Plan: Extubation in OR  Informed Consent: I have reviewed the patients History and Physical, chart, labs and discussed the procedure including the risks, benefits and alternatives for the proposed anesthesia with the patient or authorized representative who has indicated his/her understanding and acceptance.       Plan Discussed with: CRNA and Anesthesiologist  Anesthesia Plan Comments: (  )       Anesthesia Quick Evaluation

## 2020-08-12 ENCOUNTER — Encounter (HOSPITAL_COMMUNITY)
Admission: RE | Disposition: A | Discharge status: Home / Self Care | Payer: Medicare HMO | Source: Home / Self Care | Attending: Cardiology

## 2020-08-12 ENCOUNTER — Ambulatory Visit (HOSPITAL_COMMUNITY): Payer: Medicare HMO | Admitting: Anesthesiology

## 2020-08-12 ENCOUNTER — Encounter (HOSPITAL_BASED_OUTPATIENT_CLINIC_OR_DEPARTMENT_OTHER): Payer: Medicare HMO | Admitting: Cardiovascular Disease

## 2020-08-12 ENCOUNTER — Encounter (HOSPITAL_COMMUNITY): Payer: Self-pay | Admitting: Cardiology

## 2020-08-12 ENCOUNTER — Telehealth: Payer: Self-pay

## 2020-08-12 ENCOUNTER — Ambulatory Visit (HOSPITAL_COMMUNITY)
Admission: RE | Admit: 2020-08-12 | Discharge: 2020-08-12 | Disposition: A | Discharge status: Home / Self Care | Payer: Medicare HMO | Attending: Cardiology | Admitting: Cardiology

## 2020-08-12 ENCOUNTER — Other Ambulatory Visit: Payer: Self-pay

## 2020-08-12 DIAGNOSIS — M170 Bilateral primary osteoarthritis of knee: Secondary | ICD-10-CM | POA: Diagnosis not present

## 2020-08-12 DIAGNOSIS — I4819 Other persistent atrial fibrillation: Secondary | ICD-10-CM | POA: Insufficient documentation

## 2020-08-12 DIAGNOSIS — Z79899 Other long term (current) drug therapy: Secondary | ICD-10-CM | POA: Insufficient documentation

## 2020-08-12 DIAGNOSIS — G4733 Obstructive sleep apnea (adult) (pediatric): Secondary | ICD-10-CM | POA: Diagnosis not present

## 2020-08-12 DIAGNOSIS — I480 Paroxysmal atrial fibrillation: Secondary | ICD-10-CM | POA: Diagnosis not present

## 2020-08-12 DIAGNOSIS — Z7901 Long term (current) use of anticoagulants: Secondary | ICD-10-CM | POA: Insufficient documentation

## 2020-08-12 DIAGNOSIS — I10 Essential (primary) hypertension: Secondary | ICD-10-CM | POA: Insufficient documentation

## 2020-08-12 DIAGNOSIS — F32A Depression, unspecified: Secondary | ICD-10-CM | POA: Diagnosis not present

## 2020-08-12 DIAGNOSIS — Z881 Allergy status to other antibiotic agents status: Secondary | ICD-10-CM | POA: Diagnosis not present

## 2020-08-12 DIAGNOSIS — Z87891 Personal history of nicotine dependence: Secondary | ICD-10-CM | POA: Insufficient documentation

## 2020-08-12 HISTORY — PX: ATRIAL FIBRILLATION ABLATION: EP1191

## 2020-08-12 LAB — POCT ACTIVATED CLOTTING TIME
Activated Clotting Time: 271 seconds
Activated Clotting Time: 335 seconds
Activated Clotting Time: 341 seconds

## 2020-08-12 SURGERY — ATRIAL FIBRILLATION ABLATION
Anesthesia: General

## 2020-08-12 MED ORDER — ONDANSETRON HCL 4 MG/2ML IJ SOLN
INTRAMUSCULAR | Status: DC | PRN
  Administered 2020-08-12: 4 mg via INTRAVENOUS

## 2020-08-12 MED ORDER — HEPARIN SODIUM (PORCINE) 1000 UNIT/ML IJ SOLN
INTRAMUSCULAR | Status: DC | PRN
  Administered 2020-08-12: 2000 [IU] via INTRAVENOUS
  Administered 2020-08-12: 4000 [IU] via INTRAVENOUS
  Administered 2020-08-12: 10000 [IU] via INTRAVENOUS
  Administered 2020-08-12: 2000 [IU] via INTRAVENOUS

## 2020-08-12 MED ORDER — ACETAMINOPHEN 325 MG PO TABS: 650.0000 mg | ORAL_TABLET | ORAL | Status: DC | PRN

## 2020-08-12 MED ORDER — PROPOFOL 10 MG/ML IV BOLUS
INTRAVENOUS | Status: DC | PRN
  Administered 2020-08-12: 150 mg via INTRAVENOUS

## 2020-08-12 MED ORDER — SUGAMMADEX SODIUM 200 MG/2ML IV SOLN
INTRAVENOUS | Status: DC | PRN
  Administered 2020-08-12: 275 mg via INTRAVENOUS

## 2020-08-12 MED ORDER — SODIUM CHLORIDE 0.9 % IV SOLN: INTRAVENOUS | Status: DC

## 2020-08-12 MED ORDER — SODIUM CHLORIDE 0.9% FLUSH: 3.0000 mL | Freq: Two times a day (BID) | INTRAVENOUS | Status: DC

## 2020-08-12 MED ORDER — PHENYLEPHRINE HCL-NACL 10-0.9 MG/250ML-% IV SOLN
INTRAVENOUS | Status: DC | PRN
  Administered 2020-08-12: 25 ug/min via INTRAVENOUS

## 2020-08-12 MED ORDER — MIDAZOLAM HCL 2 MG/2ML IJ SOLN
INTRAMUSCULAR | Status: DC | PRN
  Administered 2020-08-12: 2 mg via INTRAVENOUS

## 2020-08-12 MED ORDER — PROTAMINE SULFATE 10 MG/ML IV SOLN
INTRAVENOUS | Status: DC | PRN
  Administered 2020-08-12: 30 mg via INTRAVENOUS

## 2020-08-12 MED ORDER — ROCURONIUM BROMIDE 10 MG/ML (PF) SYRINGE
PREFILLED_SYRINGE | INTRAVENOUS | Status: DC | PRN
  Administered 2020-08-12: 30 mg via INTRAVENOUS
  Administered 2020-08-12 (×2): 50 mg via INTRAVENOUS
  Administered 2020-08-12: 20 mg via INTRAVENOUS
  Administered 2020-08-12: 70 mg via INTRAVENOUS

## 2020-08-12 MED ORDER — LIDOCAINE 2% (20 MG/ML) 5 ML SYRINGE
INTRAMUSCULAR | Status: DC | PRN
  Administered 2020-08-12: 100 mg via INTRAVENOUS

## 2020-08-12 MED ORDER — ONDANSETRON HCL 4 MG/2ML IJ SOLN: 4.0000 mg | Freq: Four times a day (QID) | INTRAMUSCULAR | Status: DC | PRN

## 2020-08-12 MED ORDER — SODIUM CHLORIDE 0.9% FLUSH: 3.0000 mL | INTRAVENOUS | Status: DC | PRN

## 2020-08-12 MED ORDER — HEPARIN (PORCINE) IN NACL 1000-0.9 UT/500ML-% IV SOLN
INTRAVENOUS | Status: DC | PRN
  Administered 2020-08-12 (×3): 500 mL

## 2020-08-12 MED ORDER — SODIUM CHLORIDE 0.9 % IV SOLN: 250.0000 mL | INTRAVENOUS | Status: DC | PRN

## 2020-08-12 MED ORDER — METOPROLOL TARTRATE 25 MG PO TABS: 25.0000 mg | ORAL_TABLET | Freq: Two times a day (BID) | ORAL | Status: DC

## 2020-08-12 MED ORDER — FENTANYL CITRATE (PF) 250 MCG/5ML IJ SOLN
INTRAMUSCULAR | Status: DC | PRN
  Administered 2020-08-12 (×2): 50 ug via INTRAVENOUS

## 2020-08-12 MED ORDER — PANTOPRAZOLE SODIUM 40 MG PO TBEC: 40.0000 mg | DELAYED_RELEASE_TABLET | Freq: Every day | ORAL | Status: DC

## 2020-08-12 MED ORDER — HEPARIN SODIUM (PORCINE) 1000 UNIT/ML IJ SOLN
INTRAMUSCULAR | Status: DC | PRN
  Administered 2020-08-12: 1000 [IU] via INTRAVENOUS

## 2020-08-12 MED ORDER — PHENYLEPHRINE 40 MCG/ML (10ML) SYRINGE FOR IV PUSH (FOR BLOOD PRESSURE SUPPORT)
PREFILLED_SYRINGE | INTRAVENOUS | Status: DC | PRN
  Administered 2020-08-12: 80 ug via INTRAVENOUS

## 2020-08-12 MED ORDER — DEXAMETHASONE SODIUM PHOSPHATE 10 MG/ML IJ SOLN
INTRAMUSCULAR | Status: DC | PRN
  Administered 2020-08-12: 5 mg via INTRAVENOUS

## 2020-08-12 SURGICAL SUPPLY — 20 items
BLANKET WARM UNDERBOD FULL ACC (MISCELLANEOUS) ×5 IMPLANT
CATH 8FR REPROCESSED SOUNDSTAR (CATHETERS) ×3 IMPLANT
CATH 8FR SOUNDSTAR REPROCESSED (CATHETERS) IMPLANT
CATH MAPPNG PENTARAY F 2-6-2MM (CATHETERS) IMPLANT
CATH S CIRCA THERM PROBE 10F (CATHETERS) ×2 IMPLANT
CATH SMTCH THERMOCOOL SF DF (CATHETERS) ×2 IMPLANT
CATH WEB BI DIR CSDF CRV REPRO (CATHETERS) ×2 IMPLANT
CLOSURE PERCLOSE PROSTYLE (VASCULAR PRODUCTS) ×6 IMPLANT
COVER SWIFTLINK CONNECTOR (BAG) ×3 IMPLANT
PACK EP LATEX FREE (CUSTOM PROCEDURE TRAY) ×3
PACK EP LF (CUSTOM PROCEDURE TRAY) ×1 IMPLANT
PAD PRO RADIOLUCENT 2001M-C (PAD) ×3 IMPLANT
PATCH CARTO3 (PAD) ×2 IMPLANT
PENTARAY F 2-6-2MM (CATHETERS) ×3
SHEATH BAYLIS TRANSSEPTAL 98CM (NEEDLE) ×2 IMPLANT
SHEATH CARTO VIZIGO SM CVD (SHEATH) ×2 IMPLANT
SHEATH PINNACLE 8F 10CM (SHEATH) ×4 IMPLANT
SHEATH PINNACLE 9F 10CM (SHEATH) ×2 IMPLANT
SHEATH PROBE COVER 6X72 (BAG) ×3 IMPLANT
TUBING SMART ABLATE COOLFLOW (TUBING) ×4 IMPLANT

## 2020-08-12 NOTE — Telephone Encounter (Signed)
VOB has been submitted for Monovisc, right knee. Pending BV.

## 2020-08-12 NOTE — Anesthesia Postprocedure Evaluation (Signed)
Anesthesia Post Note  Patient: KENI WAFER  Procedure(s) Performed: ATRIAL FIBRILLATION ABLATION     Patient location during evaluation: PACU Anesthesia Type: General Level of consciousness: awake and alert Pain management: pain level controlled Vital Signs Assessment: post-procedure vital signs reviewed and stable Respiratory status: spontaneous breathing, nonlabored ventilation, respiratory function stable and patient connected to nasal cannula oxygen Cardiovascular status: blood pressure returned to baseline and stable Postop Assessment: no apparent nausea or vomiting Anesthetic complications: no   No notable events documented.  Last Vitals:  Vitals:   08/12/20 1050 08/12/20 1055  BP: (!) 101/51 (!) 115/52  Pulse: 62 63  Resp: 15 17  Temp:  (!) 36.2 C  SpO2: 91% 94%    Last Pain:  Vitals:   08/12/20 1020  TempSrc:   PainSc: 0-No pain                 Ishmeal Rorie

## 2020-08-12 NOTE — Discharge Instructions (Addendum)
Post procedure care instructions No driving for 4 days. No lifting over 5 lbs for 1 week. No vigorous or sexual activity for 1 week. You may return to work/your usual activities on 08/20/20. Keep procedure site clean & dry. If you notice increased pain, swelling, bleeding or pus, call/return!  You may shower after 24 hours, but no soaking in baths/hot tubs/pools for 1 week.    You have an appointment set up with the [facility].  Multiple studies have shown that being followed by a dedicated atrial fibrillation clinic in addition to the standard care you receive from your other physicians improves health. We believe that enrollment in the atrial fibrillation clinic will allow [place] to better care for you.   The phone number to the [facility] is [phone]. The clinic is staffed Monday through Friday from 8:30am to 5pm.  Parking Directions: The clinic is located in the Heart and Vascular Building connected to [facility]. 1)From [address] turn on to [address] and go to the 3rd entrance  (Heart and Vascular entrance) on the right. 2)Look to the right for Heart &Vascular Parking Garage. 3)A code for the entrance is require, for July is 3342.   4)Take the elevators to the 1st floor. Registration is in the room with the glass walls at the end of the hallway.  If you have any trouble parking or locating the clinic, please don't hesitate to call [phone].    Cardiac Ablation, Care After  This sheet gives you information about how to care for yourself after your procedure. Your health care provider may also give you more specific instructions. If you have problems or questions, contact your health care provider. What can I expect after the procedure? After the procedure, it is common to have: Bruising around your puncture site. Tenderness around your puncture site. Skipped heartbeats. Tiredness (fatigue).  Follow these instructions at  home: Puncture site care  Follow instructions from your health care provider about how to take care of your puncture site. Make sure you: If present, leave stitches (sutures), skin glue, or adhesive strips in place. These skin closures may need to stay in place for up to 2 weeks. If adhesive strip edges start to loosen and curl up, you may trim the loose edges. Do not remove adhesive strips completely unless your health care provider tells you to do that. If a square bandage is present, this may be removed in 24 hours.  Check your puncture site every day for signs of infection. Check for: Redness, swelling, or pain. Fluid or blood. If your puncture site starts to bleed, lie down on your back, apply firm pressure to the area, and contact your health care provider. Warmth. Pus or a bad smell. Driving Do not drive for at least 4 days after your procedure or however long your health care provider recommends. (Do not resume driving if you have previously been instructed not to drive for other health reasons.) Do not drive or use heavy machinery while taking prescription pain medicine. Activity Avoid activities that take a lot of effort for at least 7 days after your procedure. Do not lift anything that is heavier than 5 lb (4.5 kg) for one week.  No sexual activity for 1 week.  Return to your normal activities as told by your health care provider. Ask your health care provider what activities are safe for you. General instructions Take over-the-counter and prescription medicines only as told by your health care provider.  Do not use any products that contain nicotine or tobacco, such as cigarettes and e-cigarettes. If you need help quitting, ask your health care provider. You may shower after 24 hours, but Do not take baths, swim, or use a hot tub for 1 week.  Do not drink alcohol for 24 hours after your procedure. Keep all follow-up visits as told by your health care provider. This is  important. Contact a health care provider if: You have redness, mild swelling, or pain around your puncture site. You have fluid or blood coming from your puncture site that stops after applying firm pressure to the area. Your puncture site feels warm to the touch. You have pus or a bad smell coming from your puncture site. You have a fever. You have chest pain or discomfort that spreads to your neck, jaw, or arm. You are sweating a lot. You feel nauseous. You have a fast or irregular heartbeat. You have shortness of breath. You are dizzy or light-headed and feel the need to lie down. You have pain or numbness in the arm or leg closest to your puncture site. Get help right away if: Your puncture site suddenly swells. Your puncture site is bleeding and the bleeding does not stop after applying firm pressure to the area. These symptoms may represent a serious problem that is an emergency. Do not wait to see if the symptoms will go away. Get medical help right away. Call your local emergency services (911 in the [place] Do not drive yourself to the hospital. Summary After the procedure, it is normal to have bruising and tenderness at the puncture site in your groin, neck, or forearm. Check your puncture site every day for signs of infection. Get help right away if your puncture site is bleeding and the bleeding does not stop after applying firm pressure to the area. This is a medical emergency. This information is not intended to replace advice given to you by your health care provider. Make sure you discuss any questions you have with your health care provider.

## 2020-08-12 NOTE — Anesthesia Procedure Notes (Signed)
Procedure Name: Intubation Date/Time: 08/12/2020 7:52 AM Performed by: Rande Brunt, CRNA Pre-anesthesia Checklist: Patient identified, Emergency Drugs available, Suction available and Patient being monitored Patient Re-evaluated:Patient Re-evaluated prior to induction Oxygen Delivery Method: Circle System Utilized Preoxygenation: Pre-oxygenation with 100% oxygen Induction Type: IV induction Ventilation: Mask ventilation without difficulty Laryngoscope Size: Mac and 3 Grade View: Grade I Tube type: Oral Tube size: 7.0 mm Number of attempts: 1 Airway Equipment and Method: Stylet Placement Confirmation: ETT inserted through vocal cords under direct vision, positive ETCO2 and breath sounds checked- equal and bilateral Secured at: 22 cm Tube secured with: Tape Dental Injury: Teeth and Oropharynx as per pre-operative assessment

## 2020-08-12 NOTE — Progress Notes (Signed)
Purewick removed 200 cc noted. Ambulated in hallway. Tol well no bleeding noted before or after ambulation.

## 2020-08-12 NOTE — Interval H&P Note (Signed)
History and Physical Interval Note:  08/12/2020 7:13 AM  Michelle Johnston  has presented today for surgery, with the diagnosis of afib.  The various methods of treatment have been discussed with the patient and family. After consideration of risks, benefits and other options for treatment, the patient has consented to  Procedure(s): ATRIAL FIBRILLATION ABLATION (N/A) as a surgical intervention.  The patient's history has been reviewed, patient examined, no change in status, stable for surgery.  I have reviewed the patient's chart and labs.  Questions were answered to the patient's satisfaction.     Makye Radle T Gabino Hagin

## 2020-08-12 NOTE — Transfer of Care (Signed)
Immediate Anesthesia Transfer of Care Note  Patient: Michelle Johnston  Procedure(s) Performed: ATRIAL FIBRILLATION ABLATION  Patient Location: Cath Lab  Anesthesia Type:General  Level of Consciousness: awake, alert  and oriented  Airway & Oxygen Therapy: Patient Spontanous Breathing and Patient connected to nasal cannula oxygen  Post-op Assessment: Report given to RN, Post -op Vital signs reviewed and stable and Patient moving all extremities X 4  Post vital signs: Reviewed and stable  Last Vitals:  Vitals Value Taken Time  BP 110/52 08/12/20 1025  Temp    Pulse 64 08/12/20 1025  Resp 14 08/12/20 1025  SpO2 98 % 08/12/20 1025  Vitals shown include unvalidated device data.  Last Pain:  Vitals:   08/12/20 1020  TempSrc:   PainSc: 0-No pain         Complications: No notable events documented.

## 2020-08-12 NOTE — Progress Notes (Signed)
Purewick placed at 1130. 800 cc's out.

## 2020-08-13 ENCOUNTER — Encounter (HOSPITAL_COMMUNITY): Payer: Self-pay | Admitting: Cardiology

## 2020-08-15 ENCOUNTER — Telehealth: Payer: Self-pay

## 2020-08-15 NOTE — Telephone Encounter (Signed)
Called and left a VM advising patient to CB to schedule for gel injection with Erskine Emery.  Approved for Monovisc, right knee [place] Patient will be responsible for 20% OOP. Co-pay of $20.00 PA Approval# [id number] Valid 08/15/2020- 11/13/2020

## 2020-08-19 DIAGNOSIS — J019 Acute sinusitis, unspecified: Secondary | ICD-10-CM | POA: Diagnosis not present

## 2020-08-19 DIAGNOSIS — H409 Unspecified glaucoma: Secondary | ICD-10-CM | POA: Diagnosis not present

## 2020-08-19 DIAGNOSIS — I4891 Unspecified atrial fibrillation: Secondary | ICD-10-CM | POA: Diagnosis not present

## 2020-08-29 DIAGNOSIS — J343 Hypertrophy of nasal turbinates: Secondary | ICD-10-CM | POA: Diagnosis not present

## 2020-08-29 DIAGNOSIS — J310 Chronic rhinitis: Secondary | ICD-10-CM | POA: Diagnosis not present

## 2020-08-29 DIAGNOSIS — H6982 Other specified disorders of Eustachian tube, left ear: Secondary | ICD-10-CM | POA: Diagnosis not present

## 2020-08-29 DIAGNOSIS — H6522 Chronic serous otitis media, left ear: Secondary | ICD-10-CM | POA: Diagnosis not present

## 2020-09-04 DIAGNOSIS — C44319 Basal cell carcinoma of skin of other parts of face: Secondary | ICD-10-CM | POA: Diagnosis not present

## 2020-09-06 ENCOUNTER — Ambulatory Visit (HOSPITAL_COMMUNITY): Payer: Medicare HMO | Admitting: Nurse Practitioner

## 2020-09-09 ENCOUNTER — Ambulatory Visit: Payer: Medicare HMO | Admitting: Physician Assistant

## 2020-09-09 ENCOUNTER — Other Ambulatory Visit (HOSPITAL_COMMUNITY): Payer: Medicare HMO

## 2020-09-09 ENCOUNTER — Other Ambulatory Visit: Payer: Self-pay

## 2020-09-09 ENCOUNTER — Encounter: Payer: Self-pay | Admitting: Physician Assistant

## 2020-09-09 DIAGNOSIS — M1711 Unilateral primary osteoarthritis, right knee: Secondary | ICD-10-CM | POA: Diagnosis not present

## 2020-09-09 MED ORDER — HYALURONAN 88 MG/4ML IX SOSY
88.0000 mg | PREFILLED_SYRINGE | INTRA_ARTICULAR | Status: AC | PRN
  Administered 2020-09-09: 88 mg via INTRA_ARTICULAR

## 2020-09-09 MED ORDER — LIDOCAINE HCL 1 % IJ SOLN
3.0000 mL | INTRAMUSCULAR | Status: AC | PRN
  Administered 2020-09-09: 3 mL

## 2020-09-09 NOTE — Progress Notes (Signed)
   Procedure Note  Patient: Michelle Johnston             Date of Birth: Apr 05, 1950           MRN: [id number]             Visit Date: 09/09/2020 HPI: Michelle Johnston returns today for Monovisc injection into her right knee.  She denies any new injury to the knee.  She does have known arthritis of the right knee.  She has no planned surgery in the next 6 months.  She has failed conservative treatment including knee arthroscopy, exercise, and cortisone injections.  She denies any new injury to the knee.  Notes that she has recurrent effusion in the knee.  Physical exam: Right knee full extension full flexion positive crepitus with passive range of motion.  Positive effusion no abnormal warmth erythema.  Procedures: Visit Diagnoses:  1. Primary osteoarthritis of right knee     Large Joint Inj on 09/09/2020 10:25 AM Indications: pain Details: 22 G 1.5 in needle, anterolateral approach  Arthrogram: No  Medications: 88 mg Hyaluronan 88 MG/4ML; 3 mL lidocaine 1 % Aspirate: 45 mL yellow and blood-tinged Outcome: tolerated well, no immediate complications Procedure, treatment alternatives, risks and benefits explained, specific risks discussed. Consent was given by the patient. Immediately prior to procedure a time out was called to verify the correct patient, procedure, equipment, support staff and site/side marked as required. Patient was prepped and draped in the usual sterile fashion.    Plan: She knows to wait at least 6 months between supplemental injections.  She may require cortisone injection and aspiration in between injections.  Questions were encouraged and answered.

## 2020-09-16 DIAGNOSIS — H6522 Chronic serous otitis media, left ear: Secondary | ICD-10-CM | POA: Diagnosis not present

## 2020-09-16 DIAGNOSIS — H6982 Other specified disorders of Eustachian tube, left ear: Secondary | ICD-10-CM | POA: Diagnosis not present

## 2020-09-19 ENCOUNTER — Ambulatory Visit (HOSPITAL_COMMUNITY)
Admission: RE | Admit: 2020-09-19 | Discharge: 2020-09-19 | Disposition: A | Discharge status: Home / Self Care | Payer: Medicare HMO | Source: Ambulatory Visit | Attending: Physician Assistant | Admitting: Physician Assistant

## 2020-09-19 ENCOUNTER — Encounter (HOSPITAL_COMMUNITY): Payer: Self-pay | Admitting: Nurse Practitioner

## 2020-09-19 ENCOUNTER — Other Ambulatory Visit: Payer: Self-pay

## 2020-09-19 VITALS — BP 122/70 | HR 55 | Ht 65.0 in | Wt 154.0 lb

## 2020-09-19 DIAGNOSIS — Z7901 Long term (current) use of anticoagulants: Secondary | ICD-10-CM | POA: Diagnosis not present

## 2020-09-19 DIAGNOSIS — I10 Essential (primary) hypertension: Secondary | ICD-10-CM | POA: Diagnosis not present

## 2020-09-19 DIAGNOSIS — Z8249 Family history of ischemic heart disease and other diseases of the circulatory system: Secondary | ICD-10-CM | POA: Diagnosis not present

## 2020-09-19 DIAGNOSIS — I4819 Other persistent atrial fibrillation: Secondary | ICD-10-CM | POA: Diagnosis not present

## 2020-09-19 DIAGNOSIS — D6869 Other thrombophilia: Secondary | ICD-10-CM | POA: Diagnosis not present

## 2020-09-19 DIAGNOSIS — I38 Endocarditis, valve unspecified: Secondary | ICD-10-CM | POA: Insufficient documentation

## 2020-09-19 DIAGNOSIS — Z87891 Personal history of nicotine dependence: Secondary | ICD-10-CM | POA: Insufficient documentation

## 2020-09-19 NOTE — Progress Notes (Signed)
Primary Care Physician: Mayra Neer, MD Primary Cardiologist: Dr Gardiner Rhyme Primary Electrophysiologist:  Dr. Quentin Ore  Referring Physician: Dr Koleen Nimrod Michelle Johnston is a 70 y.o. female with a history of atrial fibrillation, HTN, PAD, moderate MR, moderate AI, tobacco use who presents for consultation in the [facility]. She underwent DCCV on 08/15/19 but was back in afib on follow up one week later. Patient was started on flecainide but developed dizziness and vision blurring and this was discontinued. Patient is on Xarelto for a CHADS2VASC score of 3. She has symptoms of fatigue and dyspnea with exertion while she is in afib. She does admit to snoring and daytime somnolence.   She is now back in the afib clinic for afib ablation performed by Dr. Quentin Ore 08/12/20. She is in SR today and has not noted any afib. No swallowing or groin issues.   Today, she denies symptoms of palpitations, chest pain, orthopnea, PND, lower extremity edema, dizziness, presyncope, syncope, bleeding, or neurologic sequela. The patient is tolerating medications without difficulties and is otherwise without complaint today.    Atrial Fibrillation Risk Factors:  she does have symptoms or diagnosis of sleep apnea. she does have a history of rheumatic fever. she does not have a history of alcohol use. The patient does not have a history of early familial atrial fibrillation or other arrhythmias.  she has a BMI of There is no height or weight on file to calculate BMI.. There were no vitals filed for this visit.   Family History  Problem Relation Age of Onset   Hypertension Mother    Hyperlipidemia Mother    Lymphoma Mother    Lung cancer Father      Atrial Fibrillation Management history:  Previous antiarrhythmic drugs: flecainide Previous cardioversions: 08/17/19 Previous ablations: none CHADS2VASC score: 3 Anticoagulation history: Xarelto   Past Medical History:   Diagnosis Date   Arthritis    Bradycardia    Bronchitis    Depression    Glaucoma    Hypertension    Seasonal allergies    Wears glasses    Past Surgical History:  Procedure Laterality Date   ABDOMINAL HYSTERECTOMY  1990   ATRIAL FIBRILLATION ABLATION N/A 08/12/2020   Procedure: ATRIAL FIBRILLATION ABLATION;  Surgeon: Vickie Epley, MD;  Location: [facility];  Service: Cardiovascular;  Laterality: N/A;   BREAST BIOPSY Right    benign   CARDIOVERSION N/A 08/15/2019   Procedure: CARDIOVERSION;  Surgeon: Nigel Mormon, MD;  Location: [facility];  Service: Cardiovascular;  Laterality: N/A;   COLONOSCOPY     meniscus tear knee     left knee   NASAL SINUS SURGERY  1975   ORIF ANKLE FRACTURE  2009   left   TOTAL KNEE ARTHROPLASTY Left 02/03/2019   Procedure: LEFT TOTAL KNEE ARTHROPLASTY;  Surgeon: Mcarthur Rossetti, MD;  Location: [facility];  Service: Orthopedics;  Laterality: Left;   TURBINATE REDUCTION Bilateral 06/04/2014   Procedure: BILATERAL TURBINATE REDUCTION;  Surgeon: Leta Baptist, MD;  Location: [facility];  Service: ENT;  Laterality: Bilateral;   TYMPANOSTOMY TUBE PLACEMENT     left    Current Outpatient Medications  Medication Sig Dispense Refill   acetaminophen (TYLENOL) 500 MG tablet Take 1,000 mg by mouth every 6 (six) hours as needed. pain     albuterol (VENTOLIN HFA) 108 (90 Base) MCG/ACT inhaler Inhale 1-2 puffs into the lungs every 6 (six) hours as needed for wheezing  or shortness of breath.     amLODipine (NORVASC) 5 MG tablet Take 5 mg by mouth at bedtime.      b complex vitamins tablet Take 1 tablet by mouth daily.     buPROPion (WELLBUTRIN XL) 300 MG 24 hr tablet Take 150 mg by mouth daily.     calcium carbonate (OS-CAL - DOSED IN MG OF ELEMENTAL CALCIUM) 1250 (500 Ca) MG tablet Take 1 tablet by mouth daily with breakfast.     cetirizine (ZYRTEC) 10 MG tablet Take 10 mg by mouth at bedtime.     Cholecalciferol (VITAMIN D) 50  MCG (2000 UT) tablet Take 2,000 Units by mouth daily.     FLUoxetine (PROZAC) 40 MG capsule Take 40 mg by mouth daily.     Fluticasone-Salmeterol (ADVAIR) 250-50 MCG/DOSE AEPB Inhale 1 puff into the lungs 2 (two) times daily as needed (asthma).     gabapentin (NEURONTIN) 300 MG capsule Take 300 mg by mouth 2 (two) times daily.     guaiFENesin (MUCINEX) 600 MG 12 hr tablet Take 600 mg by mouth daily as needed for cough or to loosen phlegm.     ipratropium (ATROVENT) 0.06 % nasal spray Place 1 spray into both nostrils daily as needed for rhinitis.     latanoprost (XALATAN) 0.005 % ophthalmic solution Place 1 drop into both eyes every morning.      meloxicam (MOBIC) 15 MG tablet Take 15 mg by mouth daily as needed for pain.     metoprolol tartrate (LOPRESSOR) 25 MG tablet Take 25 mg by mouth 2 (two) times daily.     montelukast (SINGULAIR) 10 MG tablet Take 10 mg by mouth at bedtime.     Multiple Vitamins-Minerals (MULTIVITAMIN WITH MINERALS) tablet Take 1 tablet by mouth daily.     nitroGLYCERIN (NITROSTAT) 0.4 MG SL tablet Place 0.4 mg under the tongue every 5 (five) minutes as needed for chest pain.     Omega-3 Fatty Acids (FISH OIL) 1000 MG CAPS Take 1,000 mg by mouth daily.     omeprazole (PRILOSEC) 20 MG capsule Take 20 mg by mouth daily as needed (acid reflux).     rivaroxaban (XARELTO) 20 MG TABS tablet Take 1 tablet (20 mg total) by mouth daily with supper. 90 tablet 3   rosuvastatin (CRESTOR) 10 MG tablet Take 10 mg by mouth daily.     Turmeric 500 MG CAPS Take 500 mg by mouth daily.     vitamin C (ASCORBIC ACID) 500 MG tablet Take 500 mg by mouth daily.     No current facility-administered medications for this encounter.    Allergies  Allergen Reactions   Ciprofibrate     Other reaction(s): rash?? (see OV 12/24/2011)   Flecainide     Pt had stroke   Other     Other reaction(s): head congestion(Nasal Steroid)   Ceclor [Cefaclor] Hives   Ciprofloxacin Hives   Flagyl  [Metronidazole] Hives    Social History   Socioeconomic History   Marital status: Married    Spouse name: Not on file   Number of children: 0   Years of education: Not on file   Highest education level: Not on file  Occupational History   Not on file  Tobacco Use   Smoking status: Former    Packs/day: 2.00    Years: 30.00    Pack years: 60.00    Types: Cigarettes    Quit date: 05/28/1998    Years since quitting: 22.3   Smokeless  tobacco: Never  Vaping Use   Vaping Use: Never used  Substance and Sexual Activity   Alcohol use: Yes    Alcohol/week: 3.0 standard drinks    Types: 2 Glasses of wine, 1 Cans of beer per week    Comment: daily-wine daily-1 glass   Drug use: No   Sexual activity: Not on file  Other Topics Concern   Not on file  Social History Narrative   Not on file   Social Determinants of Health   Financial Resource Strain: Not on file  Food Insecurity: Not on file  Transportation Needs: Not on file  Physical Activity: Not on file  Stress: Not on file  Social Connections: Not on file  Intimate Partner Violence: Not on file     ROS- All systems are reviewed and negative except as per the HPI above.  Physical Exam: There were no vitals filed for this visit.   GEN- The patient is well appearing, alert and oriented x 3 today.   Head- normocephalic, atraumatic Eyes-  Sclera clear, conjunctiva pink Ears- hearing intact Oropharynx- clear Neck- supple  Lungs- Clear to ausculation bilaterally, normal work of breathing Heart- irregular rate and rhythm, no murmurs, rubs or gallops  GI- soft, NT, ND, + BS Extremities- no clubbing, cyanosis, or edema MS- no significant deformity or atrophy Skin- no rash or lesion Psych- euthymic mood, full affect Neuro- strength and sensation are intact  Wt Readings from Last 3 Encounters:  08/12/20 68 kg  07/23/20 68.8 kg  05/21/20 68.9 kg    EKG today demonstrates  SR Vent. rate 55 BPM QRS duration 104  ms QT/QTc 466/445  ms  Echo  06/04/20-1. Left ventricular ejection fraction, by estimation, is 55 to 60%. The  left ventricle has normal function. The left ventricle has no regional  wall motion abnormalities. There is mild left ventricular hypertrophy.  Left ventricular diastolic parameters  are indeterminate.   2. Right ventricular systolic function is normal. The right ventricular  size is normal. There is normal pulmonary artery systolic pressure. The  estimated right ventricular systolic pressure is 32.3 mmHg.   3. Left atrial size was moderately dilated.   4. Right atrial size was severely dilated.   5. The mitral valve is normal in structure. Mild mitral valve  regurgitation.   6. The aortic valve is tricuspid. Aortic valve regurgitation is mild. No  aortic stenosis is present.   7. Aortic dilatation noted. There is mild dilatation of the ascending  aorta, measuring 39 mm.   8. The inferior vena cava is normal in size with greater than 50%  respiratory variability, suggesting right atrial pressure of 3 mmHg.   Epic records are reviewed at length today  CHA2DS2-VASc Score = 4  The patient's score is based upon: CHF History: No HTN History: Yes Diabetes History: No Stroke History: No Vascular Disease History: Yes Age Score: 1 Gender Score: 1      ASSESSMENT AND PLAN: 1. Persistent Atrial Fibrillation (ICD10:  I48.19) The patient's CHA2DS2-VASc score is 4, indicating a 4.8% annual risk of stroke.    Doing well staying in SR, no afib appreciated   Continue Xarelto 20 mg daily Continue Lopressor 25 mg BID  2. Secondary Hypercoagulable State (ICD10:  D68.69) The patient is at significant risk for stroke/thromboembolism based upon her CHA2DS2-VASc Score of 4.  Continue Rivaroxaban (Xarelto).   3. HTN Stable, no changes today.  4. Valvular heart disease Moderate MR, moderate AI Followed by Dr Gardiner Rhyme  F/u with Dr. Gardiner Rhyme  and Dr. Quentin Ore  as scheduled  Geroge Baseman.  Maila Dukes, [facility] [address] [place], [place] [address] [phone]

## 2020-09-20 NOTE — Progress Notes (Signed)
Cardiology Office Note:    Date:  09/23/2020   ID:  Michelle Johnston, DOB 08/08/1950, MRN [id number]  PCP:  Mayra Neer, MD  Cardiologist:  None  Electrophysiologist:  Vickie Epley, MD   Referring MD: Mayra Neer, MD   No chief complaint on file.   History of Present Illness:    Michelle Johnston is a 70 y.o. female with a hx of paroxysmal atrial fibrillation, moderate MR, moderate AI, tobacco use who is referred by Dr. Brigitte Pulse for evaluation of atrial fibrillation.  Previously followed with Dr. Virgina Jock in cardiology, last seen 02/07/2020.  She was started on flecainide for A. fib on 12/8, subsequently developed dizziness and blurry vision along with left upper arm numbness.  She was sent to the ED for concern for TIA/CVA.  MRI negative for stroke.  Neurology was consulted, thought that symptoms likely are due to adverse reaction to flecainide as opposed to TIA.  She had successful cardioversion on 08/15/2019 but did not sustain sinus rhythm, was back in A. fib at follow-up visit on 08/24/2019.  Echocardiogram on 03/12/2019 showed EF 55 to 60%, mild LVH, grade 1 diastolic dysfunction, moderate AI, moderate MR.  Lexiscan Myoview on 08/07/2019 showed normal perfusion, EF 43%.  ABIs on 01/02/2020 were mildly reduced in RLE (0.92) and normal in LLE (1.0). repeat echocardiogram 02/06/2020 showed EF 50 to 55%, moderate AI, moderate MR, moderate TR, severe left atrial dilatation, mild right atrial dilatation.  Echocardiogram 06/24/2020 showed EF 55 to 60%, normal RV function, moderate left atrial dilatation, severe right atrial dilatation, mild MR, mild AI, mild dilatation of ascending aorta measuring 39 mm.   Since last clinic visit, she experiences brief lightheadedness and shortness of breath. After her ablation, she experiences fluttering on the left side of her chest that lasts for a few minutes. Denies any LE edema, syncope or palpitations. She use to do water aerobics for 5 days out the week however  has stopped due to back issues. She recently has joined [employer] and walks in the pool.  She takes Rivaroxban, denies any bleeding issues.   Past Medical History:  Diagnosis Date   Arthritis    Bradycardia    Bronchitis    Depression    Glaucoma    Hypertension    Seasonal allergies    Wears glasses     Past Surgical History:  Procedure Laterality Date   ABDOMINAL HYSTERECTOMY  1990   ATRIAL FIBRILLATION ABLATION N/A 08/12/2020   Procedure: ATRIAL FIBRILLATION ABLATION;  Surgeon: Vickie Epley, MD;  Location: [facility];  Service: Cardiovascular;  Laterality: N/A;   BREAST BIOPSY Right    benign   CARDIOVERSION N/A 08/15/2019   Procedure: CARDIOVERSION;  Surgeon: Nigel Mormon, MD;  Location: [facility];  Service: Cardiovascular;  Laterality: N/A;   COLONOSCOPY     meniscus tear knee     left knee   NASAL SINUS SURGERY  1975   ORIF ANKLE FRACTURE  2009   left   TOTAL KNEE ARTHROPLASTY Left 02/03/2019   Procedure: LEFT TOTAL KNEE ARTHROPLASTY;  Surgeon: Mcarthur Rossetti, MD;  Location: [facility];  Service: Orthopedics;  Laterality: Left;   TURBINATE REDUCTION Bilateral 06/04/2014   Procedure: BILATERAL TURBINATE REDUCTION;  Surgeon: Leta Baptist, MD;  Location: [facility];  Service: ENT;  Laterality: Bilateral;   TYMPANOSTOMY TUBE PLACEMENT     left    Current Medications: Current Meds  Medication Sig   acetaminophen (TYLENOL) 500  MG tablet Take 1,000 mg by mouth every 6 (six) hours as needed. pain   albuterol (VENTOLIN HFA) 108 (90 Base) MCG/ACT inhaler Inhale 1-2 puffs into the lungs every 6 (six) hours as needed for wheezing or shortness of breath.   amLODipine (NORVASC) 5 MG tablet Take 5 mg by mouth at bedtime.    b complex vitamins tablet Take 1 tablet by mouth daily.   buPROPion (WELLBUTRIN XL) 300 MG 24 hr tablet Take 150 mg by mouth daily.   calcium carbonate (OS-CAL - DOSED IN MG OF ELEMENTAL CALCIUM) 1250 (500 Ca) MG tablet Take 1  tablet by mouth daily with breakfast.   cetirizine (ZYRTEC) 10 MG tablet Take 10 mg by mouth at bedtime.   Cholecalciferol (VITAMIN D) 50 MCG (2000 UT) tablet Take 2,000 Units by mouth daily.   FLUoxetine (PROZAC) 40 MG capsule Take 40 mg by mouth daily.   Fluticasone-Salmeterol (ADVAIR) 250-50 MCG/DOSE AEPB Inhale 1 puff into the lungs 2 (two) times daily as needed (asthma).   gabapentin (NEURONTIN) 300 MG capsule Take 300 mg by mouth 2 (two) times daily. Takes it once daily or twice daily   guaiFENesin (MUCINEX) 600 MG 12 hr tablet Take 600 mg by mouth daily as needed for cough or to loosen phlegm.   ipratropium (ATROVENT) 0.06 % nasal spray Place 1 spray into both nostrils daily as needed for rhinitis.   latanoprost (XALATAN) 0.005 % ophthalmic solution Place 1 drop into both eyes every morning.    meloxicam (MOBIC) 15 MG tablet Take 15 mg by mouth daily as needed for pain.   metoprolol tartrate (LOPRESSOR) 25 MG tablet TAKE 1 TABLET (25 MG TOTAL) BY MOUTH 2 (TWO) TIMES DAILY.   montelukast (SINGULAIR) 10 MG tablet Take 10 mg by mouth at bedtime.   Multiple Vitamins-Minerals (MULTIVITAMIN WITH MINERALS) tablet Take 1 tablet by mouth daily.   nitroGLYCERIN (NITROSTAT) 0.4 MG SL tablet Place 0.4 mg under the tongue every 5 (five) minutes as needed for chest pain.   Omega-3 Fatty Acids (FISH OIL) 1000 MG CAPS Take 1,000 mg by mouth daily.   omeprazole (PRILOSEC) 20 MG capsule Take 20 mg by mouth daily as needed (acid reflux).   rivaroxaban (XARELTO) 20 MG TABS tablet Take 1 tablet (20 mg total) by mouth daily with supper.   rosuvastatin (CRESTOR) 10 MG tablet Take 10 mg by mouth daily.   Turmeric 500 MG CAPS Take 500 mg by mouth daily.   vitamin C (ASCORBIC ACID) 500 MG tablet Take 500 mg by mouth daily.     Allergies:   Ciprofibrate, Flecainide, Other, Ceclor [cefaclor], Ciprofloxacin, and Flagyl [metronidazole]   Social History   Socioeconomic History   Marital status: Married    Spouse  name: Not on file   Number of children: 0   Years of education: Not on file   Highest education level: Not on file  Occupational History   Not on file  Tobacco Use   Smoking status: Former    Packs/day: 2.00    Years: 30.00    Pack years: 60.00    Types: Cigarettes    Quit date: 05/28/1998    Years since quitting: 22.3   Smokeless tobacco: Never  Vaping Use   Vaping Use: Never used  Substance and Sexual Activity   Alcohol use: Yes    Alcohol/week: 3.0 - 4.0 standard drinks    Types: 2 Glasses of wine, 1 - 2 Cans of beer per week    Comment: daily-wine  daily-1 glass   Drug use: No   Sexual activity: Not on file  Other Topics Concern   Not on file  Social History Narrative   Not on file   Social Determinants of Health   Financial Resource Strain: Not on file  Food Insecurity: Not on file  Transportation Needs: Not on file  Physical Activity: Not on file  Stress: Not on file  Social Connections: Not on file     Family History: The patient's family history includes Hyperlipidemia in her mother; Hypertension in her mother; Lung cancer in her father; Lymphoma in her mother.  ROS:   Please see the history of present illness.     (+) heart flutters (+) lightheadedness (+) shortness of breath  All other systems reviewed and are negative.  EKGs/Labs/Other Studies Reviewed:    The following studies were reviewed today:  Echo 04/22: IMPRESSIONS    1. Left ventricular ejection fraction, by estimation, is 55 to 60%. The  left ventricle has normal function. The left ventricle has no regional  wall motion abnormalities. There is mild left ventricular hypertrophy.  Left ventricular diastolic parameters  are indeterminate.   2. Right ventricular systolic function is normal. The right ventricular  size is normal. There is normal pulmonary artery systolic pressure. The  estimated right ventricular systolic pressure is 0000000 mmHg.   3. Left atrial size was moderately dilated.    4. Right atrial size was severely dilated.   5. The mitral valve is normal in structure. Mild mitral valve  regurgitation.   6. The aortic valve is tricuspid. Aortic valve regurgitation is mild. No  aortic stenosis is present.   7. Aortic dilatation noted. There is mild dilatation of the ascending  aorta, measuring 39 mm.   8. The inferior vena cava is normal in size with greater than 50%  respiratory variability, suggesting right atrial pressure of 3 mmHg.   EKG:   07/22: no EKG was ordered today. 01/22: atrial fibrillation, rate 85, incomplete right bundle branch block  Recent Labs: 02/07/2020: ALT 21 03/27/2020: Magnesium 2.0 07/31/2020: BUN 16; Creatinine, Ser 0.83; Hemoglobin 12.6; Platelets 254; Potassium 4.5; Sodium 136  Recent Lipid Panel No results found for: CHOL, TRIG, HDL, CHOLHDL, VLDL, LDLCALC, LDLDIRECT  Physical Exam:    VS:  BP 122/60   Pulse (!) 56   Resp 18   Ht '5\' 5"'$  (1.651 m)   Wt 153 lb 3.2 oz (69.5 kg)   SpO2 96%   BMI 25.49 kg/m     Wt Readings from Last 3 Encounters:  09/23/20 153 lb 3.2 oz (69.5 kg)  09/19/20 154 lb (69.9 kg)  08/12/20 150 lb (68 kg)     GEN:  in no acute distress HEENT: Normal NECK: No JVD; No carotid bruits LYMPHATICS: No lymphadenopathy CARDIAC: Normal rate, RRR, no murmurs RESPIRATORY:  Clear to auscultation without rales, wheezing or rhonchi  ABDOMEN: Soft, non-tender, non-distended MUSCULOSKELETAL:  No edema; No deformity  SKIN: Warm and dry NEUROLOGIC:  Alert and oriented x 3 PSYCHIATRIC:  Normal affect  ASSESSMENT:    1. Persistent atrial fibrillation ([place])   2. Hyperlipidemia, unspecified hyperlipidemia type   3. Mitral valve insufficiency, unspecified etiology   4. Aortic valve insufficiency, etiology of cardiac valve disease unspecified   5. PAD (peripheral artery disease) ([facility])     PLAN:    Atrial fibrillation: Persistent. CHA2DS2-VASc score 3 (hypertension, age, female).  On Xarelto 20 mg daily.   Successful cardioversion in June but went back  into A. fib.  Was tried on flecainide but had adverse reaction.  Underwent ablation with Dr. Quentin Ore on 08/12/2020.  Appears to be maintaining sinus rhythm. -Continue Xarelto -Continue metoprolol 25 mg twice daily  Mtral regurgitation: Moderate by report on echo 02/06/2020, repeat echo on 06/24/2020 showed mild MR  Aortic regurgitation: Moderate by report on echo 02/06/2020 repeat echo on 06/24/2020 showed mild AI  Hypertension: On amlodipine 5 mg daily and metoprolol 25 mg twice daily.  Appears controlled  PAD: Mild PAD RLE (ABI 0.92), normal in LLE (ABI 1.0 ).  On Xarelto.  On rosuvastatin 10 mg daily.  Hyperlipidemia: LDL 122 on 12/14/2019.  Started on rosuvastatin 10 mg daily, will recheck lipid pnael  RTC in 6 months  Medication Adjustments/Labs and Tests Ordered: Current medicines are reviewed at length with the patient today.  Concerns regarding medicines are outlined above.  Orders Placed This Encounter  Procedures   Lipid panel    No orders of the defined types were placed in this encounter.   Patient Instructions  Medication Instructions:  Your physician recommends that you continue on your current medications as directed. Please refer to the Current Medication list given to you today.  *If you need a refill on your cardiac medications before your next appointment, please call your pharmacy*   Lab Work: Lipid today  If you have labs (blood work) drawn today and your tests are completely normal, you will receive your results only by: [place] (if you have MyChart) OR A paper copy in the mail If you have any lab test that is abnormal or we need to change your treatment, we will call you to review the results.  Follow-Up: At [facility], you and your health needs are our priority.  As part of our continuing mission to provide you with exceptional heart care, we have created designated Provider Care Teams.  These Care  Teams include your primary Cardiologist (physician) and Advanced Practice Providers (APPs -  Physician Assistants and Nurse Practitioners) who all work together to provide you with the care you need, when you need it.  We recommend signing up for the patient portal called "MyChart".  Sign up information is provided on this After Visit Summary.  MyChart is used to connect with patients for Virtual Visits (Telemedicine).  Patients are able to view lab/test results, encounter notes, upcoming appointments, etc.  Non-urgent messages can be sent to your provider as well.   To learn more about what you can do with MyChart, go to [email or web address].    Your next appointment:   6 month(s)  The format for your next appointment:   In Person  Provider:   Oswaldo Milian, MD     Ardell Isaacs as a scribe for Donato Heinz, MD.,have documented all relevant documentation on the behalf of Donato Heinz, MD,as directed by  Donato Heinz, MD while in the presence of Donato Heinz, MD.  I, Donato Heinz, MD, have reviewed all documentation for this visit. The documentation on 09/23/20 for the exam, diagnosis, procedures, and orders are all accurate and complete.   Signed, Donato Heinz, MD  09/23/2020 12:12 PM    [place]

## 2020-09-21 ENCOUNTER — Other Ambulatory Visit (HOSPITAL_COMMUNITY): Payer: Self-pay | Admitting: Physician Assistant

## 2020-09-23 ENCOUNTER — Other Ambulatory Visit: Payer: Self-pay

## 2020-09-23 ENCOUNTER — Ambulatory Visit: Payer: Medicare HMO | Admitting: Cardiology

## 2020-09-23 ENCOUNTER — Encounter: Payer: Self-pay | Admitting: Cardiology

## 2020-09-23 VITALS — BP 122/60 | HR 56 | Resp 18 | Ht 65.0 in | Wt 153.2 lb

## 2020-09-23 DIAGNOSIS — I4819 Other persistent atrial fibrillation: Secondary | ICD-10-CM | POA: Diagnosis not present

## 2020-09-23 DIAGNOSIS — I739 Peripheral vascular disease, unspecified: Secondary | ICD-10-CM

## 2020-09-23 DIAGNOSIS — I340 Nonrheumatic mitral (valve) insufficiency: Secondary | ICD-10-CM

## 2020-09-23 DIAGNOSIS — E785 Hyperlipidemia, unspecified: Secondary | ICD-10-CM | POA: Diagnosis not present

## 2020-09-23 DIAGNOSIS — I351 Nonrheumatic aortic (valve) insufficiency: Secondary | ICD-10-CM

## 2020-09-23 NOTE — Telephone Encounter (Signed)
Request for refill in refill pool, would Dr. Quentin Ore be ok filling this medication? Please advise thank you.

## 2020-09-23 NOTE — Patient Instructions (Signed)
Medication Instructions:  Your physician recommends that you continue on your current medications as directed. Please refer to the Current Medication list given to you today.  *If you need a refill on your cardiac medications before your next appointment, please call your pharmacy*   Lab Work: Lipid today  If you have labs (blood work) drawn today and your tests are completely normal, you will receive your results only by: [place] (if you have MyChart) OR A paper copy in the mail If you have any lab test that is abnormal or we need to change your treatment, we will call you to review the results.  Follow-Up: At [facility], you and your health needs are our priority.  As part of our continuing mission to provide you with exceptional heart care, we have created designated Provider Care Teams.  These Care Teams include your primary Cardiologist (physician) and Advanced Practice Providers (APPs -  Physician Assistants and Nurse Practitioners) who all work together to provide you with the care you need, when you need it.  We recommend signing up for the patient portal called "MyChart".  Sign up information is provided on this After Visit Summary.  MyChart is used to connect with patients for Virtual Visits (Telemedicine).  Patients are able to view lab/test results, encounter notes, upcoming appointments, etc.  Non-urgent messages can be sent to your provider as well.   To learn more about what you can do with MyChart, go to [email or web address].    Your next appointment:   6 month(s)  The format for your next appointment:   In Person  Provider:   Oswaldo Milian, MD

## 2020-09-24 LAB — LIPID PANEL
Chol/HDL Ratio: 2.1 ratio (ref 0.0–4.4)
Cholesterol, Total: 214 mg/dL — ABNORMAL HIGH (ref 100–199)
HDL: 104 mg/dL (ref >39)
LDL Chol Calc (NIH): 92 mg/dL (ref 0–99)
Triglycerides: 109 mg/dL (ref 0–149)
VLDL Cholesterol Cal: 18 mg/dL (ref 5–40)

## 2020-09-27 DIAGNOSIS — M5416 Radiculopathy, lumbar region: Secondary | ICD-10-CM | POA: Diagnosis not present

## 2020-10-01 DIAGNOSIS — I4891 Unspecified atrial fibrillation: Secondary | ICD-10-CM | POA: Diagnosis not present

## 2020-10-01 DIAGNOSIS — E7800 Pure hypercholesterolemia, unspecified: Secondary | ICD-10-CM | POA: Diagnosis not present

## 2020-10-01 DIAGNOSIS — J42 Unspecified chronic bronchitis: Secondary | ICD-10-CM | POA: Diagnosis not present

## 2020-10-01 DIAGNOSIS — D6869 Other thrombophilia: Secondary | ICD-10-CM | POA: Diagnosis not present

## 2020-10-08 ENCOUNTER — Other Ambulatory Visit: Payer: Self-pay | Admitting: Family Medicine

## 2020-10-08 ENCOUNTER — Ambulatory Visit
Admission: RE | Admit: 2020-10-08 | Discharge: 2020-10-08 | Disposition: A | Discharge status: Home / Self Care | Payer: Medicare HMO | Source: Ambulatory Visit | Attending: Family Medicine | Admitting: Family Medicine

## 2020-10-08 ENCOUNTER — Other Ambulatory Visit: Payer: Self-pay

## 2020-10-08 DIAGNOSIS — J42 Unspecified chronic bronchitis: Secondary | ICD-10-CM | POA: Diagnosis not present

## 2020-10-09 ENCOUNTER — Encounter (HOSPITAL_BASED_OUTPATIENT_CLINIC_OR_DEPARTMENT_OTHER): Payer: Medicare HMO | Admitting: Cardiovascular Disease

## 2020-10-15 ENCOUNTER — Ambulatory Visit: Payer: Medicare HMO | Admitting: Orthopaedic Surgery

## 2020-10-18 DIAGNOSIS — H903 Sensorineural hearing loss, bilateral: Secondary | ICD-10-CM | POA: Diagnosis not present

## 2020-10-18 DIAGNOSIS — H7202 Central perforation of tympanic membrane, left ear: Secondary | ICD-10-CM | POA: Diagnosis not present

## 2020-10-18 DIAGNOSIS — H6982 Other specified disorders of Eustachian tube, left ear: Secondary | ICD-10-CM | POA: Diagnosis not present

## 2020-10-22 DIAGNOSIS — M5416 Radiculopathy, lumbar region: Secondary | ICD-10-CM | POA: Diagnosis not present

## 2020-10-29 ENCOUNTER — Other Ambulatory Visit: Payer: Self-pay

## 2020-10-29 ENCOUNTER — Ambulatory Visit (HOSPITAL_BASED_OUTPATIENT_CLINIC_OR_DEPARTMENT_OTHER): Payer: Medicare HMO | Attending: Cardiovascular Disease | Admitting: Cardiovascular Disease

## 2020-10-29 DIAGNOSIS — G4736 Sleep related hypoventilation in conditions classified elsewhere: Secondary | ICD-10-CM | POA: Diagnosis not present

## 2020-10-29 DIAGNOSIS — Z791 Long term (current) use of non-steroidal anti-inflammatories (NSAID): Secondary | ICD-10-CM | POA: Insufficient documentation

## 2020-10-29 DIAGNOSIS — Z7951 Long term (current) use of inhaled steroids: Secondary | ICD-10-CM | POA: Insufficient documentation

## 2020-10-29 DIAGNOSIS — Z79899 Other long term (current) drug therapy: Secondary | ICD-10-CM | POA: Insufficient documentation

## 2020-10-29 DIAGNOSIS — G4733 Obstructive sleep apnea (adult) (pediatric): Secondary | ICD-10-CM | POA: Diagnosis not present

## 2020-10-29 DIAGNOSIS — Z7901 Long term (current) use of anticoagulants: Secondary | ICD-10-CM | POA: Diagnosis not present

## 2020-11-12 ENCOUNTER — Encounter: Payer: Self-pay | Admitting: Orthopaedic Surgery

## 2020-11-12 ENCOUNTER — Ambulatory Visit: Payer: Medicare HMO | Admitting: Physician Assistant

## 2020-11-12 DIAGNOSIS — M1711 Unilateral primary osteoarthritis, right knee: Secondary | ICD-10-CM

## 2020-11-12 MED ORDER — METHYLPREDNISOLONE ACETATE 40 MG/ML IJ SUSP
40.0000 mg | INTRAMUSCULAR | Status: AC | PRN
  Administered 2020-11-12: 40 mg via INTRA_ARTICULAR

## 2020-11-12 MED ORDER — LIDOCAINE HCL 1 % IJ SOLN
3.0000 mL | INTRAMUSCULAR | Status: AC | PRN
Start: 2020-11-12 — End: 2020-11-12
  Administered 2020-11-12: 3 mL

## 2020-11-12 NOTE — Progress Notes (Signed)
   Procedure Note  Patient: Michelle Johnston             Date of Birth: [birth year]           MRN: [id number]             Visit Date: 11/12/2020  HPI: Mrs. Benjamine Mola returns today status post right knee Monovisc injection 08/30/2021.  She states it may have helped a little bit but not much.  She does have fluid on the knee today.  She had any injury.  Again she has known osteoarthritis of the right knee.  Physical exam: Right knee good range of motion no abnormal warmth erythema.  Positive effusion.  Procedures: Visit Diagnoses:  1. Primary osteoarthritis of right knee     Large Joint Inj on 11/12/2020 10:38 AM Indications: pain Details: 22 G 1.5 in needle, superolateral approach  Arthrogram: No  Medications: 3 mL lidocaine 1 %; 40 mg methylPREDNISolone acetate 40 MG/ML Aspirate: 34 mL yellow Outcome: tolerated well, no immediate complications Procedure, treatment alternatives, risks and benefits explained, specific risks discussed. Consent was given by the patient. Immediately prior to procedure a time out was called to verify the correct patient, procedure, equipment, support staff and site/side marked as required. Patient was prepped and draped in the usual sterile fashion.    Plan: See her back in just 2 weeks to see what type of results she had with the aspiration and injection today.  Questions were encouraged and answered at length.

## 2020-11-15 ENCOUNTER — Encounter (HOSPITAL_BASED_OUTPATIENT_CLINIC_OR_DEPARTMENT_OTHER): Payer: Self-pay | Admitting: Cardiovascular Disease

## 2020-11-15 NOTE — Procedures (Signed)
Patient Name: Michelle Johnston, Michelle Johnston Date: 10/29/2020 Gender: Female D.O.B: Nov 02, 1950 Age (years): 70 Referring Provider: Malka So PA Height (inches): 65 Interpreting Physician: Shelva Majestic MD, ABSM Weight (lbs): 150 RPSGT: Carolin Coy BMI: 25 MRN: [id number] Neck Size: 13.50  CLINICAL INFORMATION The patient is referred for a CPAP titration to treat sleep apnea.  Date of NPSG: 05/14/2020: AHI 11/h; RDI 19/h; supine AHI 30.2/h; O2 nadir 87%.  SLEEP STUDY TECHNIQUE As per the AASM Manual for the Scoring of Sleep and Associated Events v2.3 (April 2016) with a hypopnea requiring 4% desaturations.  The channels recorded and monitored were frontal, central and occipital EEG, electrooculogram (EOG), submentalis EMG (chin), nasal and oral airflow, thoracic and abdominal wall motion, anterior tibialis EMG, snore microphone, electrocardiogram, and pulse oximetry. Continuous positive airway pressure (CPAP) was initiated at the beginning of the study and titrated to treat sleep-disordered breathing.  MEDICATIONS acetaminophen (TYLENOL) 500 MG tablet albuterol (VENTOLIN HFA) 108 (90 Base) MCG/ACT inhaler amLODipine (NORVASC) 5 MG tablet b complex vitamins tablet buPROPion (WELLBUTRIN XL) 300 MG 24 hr tablet calcium carbonate (OS-CAL - DOSED IN MG OF ELEMENTAL CALCIUM) 1250 (500 Ca) MG tablet cetirizine (ZYRTEC) 10 MG tablet Cholecalciferol (VITAMIN D) 50 MCG (2000 UT) tablet FLUoxetine (PROZAC) 40 MG capsule Fluticasone-Salmeterol (ADVAIR) 250-50 MCG/DOSE AEPB gabapentin (NEURONTIN) 300 MG capsule guaiFENesin (MUCINEX) 600 MG 12 hr tablet ipratropium (ATROVENT) 0.06 % nasal spray latanoprost (XALATAN) 0.005 % ophthalmic solution meloxicam (MOBIC) 15 MG tablet metoprolol tartrate (LOPRESSOR) 25 MG tablet montelukast (SINGULAIR) 10 MG tablet Multiple Vitamins-Minerals (MULTIVITAMIN WITH MINERALS) tablet nitroGLYCERIN (NITROSTAT) 0.4 MG SL tablet Omega-3 Fatty Acids (FISH OIL)  1000 MG CAPS omeprazole (PRILOSEC) 20 MG capsule rivaroxaban (XARELTO) 20 MG TABS tablet rosuvastatin (CRESTOR) 10 MG tablet Turmeric 500 MG CAPS vitamin C (ASCORBIC ACID) 500 MG tabl Medications self-administered by patient taken the night of the study : GABAPENTIN, TYLENOL, ZYRTEC, SINGULAIR, Calicum  TECHNICIAN COMMENTS Comments added by technician: Patient was ordered as a cpap titration. Comments added by scorer: N/A  RESPIRATORY PARAMETERS Optimal PAP Pressure (cm): 15 AHI at Optimal Pressure (/hr): 0 Overall Minimal O2 (%): 91.0 Supine % at Optimal Pressure (%): 100 Minimal O2 at Optimal Pressure (%): 95.0   SLEEP ARCHITECTURE The study was initiated at 10:33:21 PM and ended at 4:40:52 AM.  Sleep onset time was 19.2 minutes and the sleep efficiency was 74.1%%. The total sleep time was 272.5 minutes.  The patient spent 26.1%% of the night in stage N1 sleep, 60.2%% in stage N2 sleep, 0.0%% in stage N3 and 13.8% in REM.Stage REM latency was 170.5 minutes  Wake after sleep onset was 75.8. Alpha intrusion was absent. Supine sleep was 55.23%.  CARDIAC DATA The 2 lead EKG demonstrated sinus rhythm. The mean heart rate was 46.9 beats per minute. Other EKG findings include: PVCs.  LEG MOVEMENT DATA The total Periodic Limb Movements of Sleep (PLMS) were 0. The PLMS index was 0.0. A PLMS index of <15 is considered normal in adults.  IMPRESSIONS - CPAP was initiated at 5 cm and was titrated to optimal PAP pressure at 15 cm of water; AHI 0; O2 nadir at 95%. - Central sleep apnea was not noted during this titration (CAI = 0.2/h). - Significant oxygen desaturations were not observed during this titration (min O2 = 91.0%). - The patient snored with moderate snoring volume during this titration study. - 2-lead EKG demonstrated: PVCs - Clinically significant periodic limb movements were not noted during this study. Arousals associated with PLMs  were rare.  DIAGNOSIS - Obstructive Sleep  Apnea (G47.33)  RECOMMENDATIONS - Recommend an initial trial of CPAP therapy with EPR of 3 at 15 cm H2O with heated humidification. A Medium size Resmed Full Face Mask AirFit F30 mask was used for the titration. - Effort should be made to optimize nasal and oropharyngeal patency. - Avoid alcohol, sedatives and other CNS depressants that may worsen sleep apnea and disrupt normal sleep architecture. - Sleep hygiene should be reviewed to assess factors that may improve sleep quality. - Weight management and regular exercise should be initiated or continued. - Recommend a download in 30 days and sleep clinic evaluation after 4 weeks of therapy.   [Electronically signed] 11/15/2020 11:13 AM  Shelva Majestic MD, [facility], ABSM Diplomate, American Board of Sleep Medicine   NPI: [id number] [place] PH: [phone]   FX: [phone] [place]

## 2020-11-18 ENCOUNTER — Telehealth: Payer: Self-pay | Admitting: *Deleted

## 2020-11-18 NOTE — Telephone Encounter (Signed)
-----   Message from Troy Sine, MD sent at 11/15/2020 11:21 AM EDT ----- Mariann Laster, please notify pt of the results and set up with DME for CPAP initiation

## 2020-11-18 NOTE — Telephone Encounter (Signed)
Patient notified CPAP titration has been completed. Order for CPAP machine has been sent to [facility].

## 2020-11-19 ENCOUNTER — Ambulatory Visit: Payer: Medicare HMO | Admitting: Cardiology

## 2020-11-20 NOTE — Progress Notes (Signed)
Electrophysiology Office Follow up Visit Note:    Date:  11/21/2020   ID:  Michelle, Johnston 1951/02/08, MRN [id number]  PCP:  Mayra Neer, MD  [facility] Cardiologist:  None  [facility] Electrophysiologist:  Vickie Epley, MD    Interval History:    Michelle Johnston is a 70 y.o. female who presents for a follow up visit after an AF ablation 08/12/2020. During the ablation, the PV's and posterior wall were ablated. She saw Dr Gardiner Rhyme on 09/23/2020 in follow up and was maintaining sinus rhythm. She is maintained on xarelto for stroke prophylaxis.   Today she tells me that she has done well since the ablation without recurrence of arrhythmia.       Past Medical History:  Diagnosis Date   Arthritis    Bradycardia    Bronchitis    Depression    Glaucoma    Hypertension    Seasonal allergies    Wears glasses     Past Surgical History:  Procedure Laterality Date   ABDOMINAL HYSTERECTOMY  1990   ATRIAL FIBRILLATION ABLATION N/A 08/12/2020   Procedure: ATRIAL FIBRILLATION ABLATION;  Surgeon: Vickie Epley, MD;  Location: [place] [facility];  Service: Cardiovascular;  Laterality: N/A;   BREAST BIOPSY Right    benign   CARDIOVERSION N/A 08/15/2019   Procedure: CARDIOVERSION;  Surgeon: Nigel Mormon, MD;  Location: [place] [facility];  Service: Cardiovascular;  Laterality: N/A;   COLONOSCOPY     meniscus tear knee     left knee   NASAL SINUS SURGERY  1975   ORIF ANKLE FRACTURE  2009   left   TOTAL KNEE ARTHROPLASTY Left 02/03/2019   Procedure: LEFT TOTAL KNEE ARTHROPLASTY;  Surgeon: Mcarthur Rossetti, MD;  [facility];  Service: Orthopedics;  Laterality: Left;   TURBINATE REDUCTION Bilateral 06/04/2014   Procedure: BILATERAL TURBINATE REDUCTION;  Surgeon: Leta Baptist, MD;  Location: [facility];  Service: ENT;  Laterality: Bilateral;   TYMPANOSTOMY TUBE PLACEMENT     left    Current Medications: Current Meds  Medication Sig    acetaminophen (TYLENOL) 500 MG tablet Take 1,000 mg by mouth every 6 (six) hours as needed. pain   albuterol (VENTOLIN HFA) 108 (90 Base) MCG/ACT inhaler Inhale 1-2 puffs into the lungs every 6 (six) hours as needed for wheezing or shortness of breath.   amLODipine (NORVASC) 5 MG tablet Take 5 mg by mouth at bedtime.    b complex vitamins tablet Take 1 tablet by mouth daily.   calcium carbonate (OS-CAL - DOSED IN MG OF ELEMENTAL CALCIUM) 1250 (500 Ca) MG tablet Take 1 tablet by mouth daily with breakfast.   cetirizine (ZYRTEC) 10 MG tablet Take 10 mg by mouth at bedtime.   Cholecalciferol (VITAMIN D) 50 MCG (2000 UT) tablet Take 2,000 Units by mouth daily.   FLUoxetine (PROZAC) 40 MG capsule Take 40 mg by mouth daily.   Fluticasone-Salmeterol (ADVAIR) 250-50 MCG/DOSE AEPB Inhale 1 puff into the lungs 2 (two) times daily as needed (asthma).   gabapentin (NEURONTIN) 300 MG capsule Take 300 mg by mouth 2 (two) times daily. Takes it once daily or twice daily   guaiFENesin (MUCINEX) 600 MG 12 hr tablet Take 600 mg by mouth daily as needed for cough or to loosen phlegm.   ipratropium (ATROVENT) 0.06 % nasal spray Place 1 spray into both nostrils daily as needed for rhinitis.   latanoprost (XALATAN) 0.005 % ophthalmic solution Place 1 drop  into both eyes every morning.    meloxicam (MOBIC) 15 MG tablet Take 15 mg by mouth daily as needed for pain.   metoprolol tartrate (LOPRESSOR) 25 MG tablet TAKE 1 TABLET (25 MG TOTAL) BY MOUTH 2 (TWO) TIMES DAILY.   montelukast (SINGULAIR) 10 MG tablet Take 10 mg by mouth at bedtime.   Multiple Vitamins-Minerals (MULTIVITAMIN WITH MINERALS) tablet Take 1 tablet by mouth daily.   nitroGLYCERIN (NITROSTAT) 0.4 MG SL tablet Place 0.4 mg under the tongue every 5 (five) minutes as needed for chest pain.   Omega-3 Fatty Acids (FISH OIL) 1000 MG CAPS Take 1,000 mg by mouth daily.   omeprazole (PRILOSEC) 20 MG capsule Take 20 mg by mouth daily as needed (acid reflux).    rivaroxaban (XARELTO) 20 MG TABS tablet Take 1 tablet (20 mg total) by mouth daily with supper.   rosuvastatin (CRESTOR) 10 MG tablet Take 10 mg by mouth daily.   Turmeric 500 MG CAPS Take 500 mg by mouth daily.   vitamin C (ASCORBIC ACID) 500 MG tablet Take 500 mg by mouth daily.     Allergies:   Ciprofibrate, Flecainide, Other, Ceclor [cefaclor], Ciprofloxacin, and Flagyl [metronidazole]   Social History   Socioeconomic History   Marital status: Married    Spouse name: Not on file   Number of children: 0   Years of education: Not on file   Highest education level: Not on file  Occupational History   Not on file  Tobacco Use   Smoking status: Former    Packs/day: 2.00    Years: 30.00    Pack years: 60.00    Types: Cigarettes    Quit date: 05/28/1998    Years since quitting: 22.5   Smokeless tobacco: Never  Vaping Use   Vaping Use: Never used  Substance and Sexual Activity   Alcohol use: Yes    Alcohol/week: 3.0 - 4.0 standard drinks    Types: 2 Glasses of wine, 1 - 2 Cans of beer per week    Comment: daily-wine daily-1 glass   Drug use: No   Sexual activity: Not on file  Other Topics Concern   Not on file  Social History Narrative   Not on file   Social Determinants of Health   Financial Resource Strain: Not on file  Food Insecurity: Not on file  Transportation Needs: Not on file  Physical Activity: Not on file  Stress: Not on file  Social Connections: Not on file     Family History: The patient's family history includes Hyperlipidemia in her mother; Hypertension in her mother; Lung cancer in her father; Lymphoma in her mother.  ROS:   Please see the history of present illness.    All other systems reviewed and are negative.  EKGs/Labs/Other Studies Reviewed:    The following studies were reviewed today:   EKG:  The ekg ordered today demonstrates sinus rhythm, left anterior fascicular block, late precordial transition  Recent Labs: 02/07/2020: ALT  21 03/27/2020: Magnesium 2.0 07/31/2020: BUN 16; Creatinine, Ser 0.83; Hemoglobin 12.6; Platelets 254; Potassium 4.5; Sodium 136  Recent Lipid Panel    Component Value Date/Time   CHOL 214 (H) 09/23/2020 1213   TRIG 109 09/23/2020 1213   HDL 104 09/23/2020 1213   CHOLHDL 2.1 09/23/2020 1213   LDLCALC 92 09/23/2020 1213    Physical Exam:    VS:  BP 130/70   Pulse (!) 54   Ht 5\' 5"  (1.651 m)   Wt 157 lb (71.2  kg)   SpO2 98%   BMI 26.13 kg/m     Wt Readings from Last 3 Encounters:  11/21/20 157 lb (71.2 kg)  10/29/20 155 lb (70.3 kg)  09/23/20 153 lb 3.2 oz (69.5 kg)     GEN:  Well nourished, well developed in no acute distress HEENT: Normal NECK: No JVD; No carotid bruits LYMPHATICS: No lymphadenopathy CARDIAC: RRR, no murmurs, rubs, gallops RESPIRATORY:  Clear to auscultation without rales, wheezing or rhonchi  ABDOMEN: Soft, non-tender, non-distended MUSCULOSKELETAL:  No edema; No deformity  SKIN: Warm and dry NEUROLOGIC:  Alert and oriented x 3 PSYCHIATRIC:  Normal affect   ASSESSMENT:    1. Persistent atrial fibrillation ([place])   2. TIA (transient ischemic attack)   3. Essential hypertension    PLAN:    In order of problems listed above:   1. Persistent atrial fibrillation [facility]) Doing well after her ablation.  She should continue her anticoagulation for stroke prophylaxis.  I will plan to see her back in 1 year or sooner as needed.  2. TIA (transient ischemic attack) Continue stroke prophylaxis as above.  3. Essential hypertension Controlled.  Continue current regimen.  Follow-up 1 year or sooner as needed.         Total time spent with patient today 30 minutes. This includes reviewing records, evaluating the patient and coordinating care.   Medication Adjustments/Labs and Tests Ordered: Current medicines are reviewed at length with the patient today.  Concerns regarding medicines are outlined above.  Orders Placed This Encounter  Procedures    EKG 12-Lead    No orders of the defined types were placed in this encounter.    Signed, Lars Mage, MD, [facility], [facility] 11/21/2020 7:14 PM    Electrophysiology [facility]

## 2020-11-21 ENCOUNTER — Other Ambulatory Visit: Payer: Self-pay

## 2020-11-21 ENCOUNTER — Ambulatory Visit: Payer: Medicare HMO | Admitting: Cardiology

## 2020-11-21 ENCOUNTER — Encounter: Payer: Self-pay | Admitting: Cardiology

## 2020-11-21 VITALS — BP 130/70 | HR 54 | Ht 65.0 in | Wt 157.0 lb

## 2020-11-21 DIAGNOSIS — I4819 Other persistent atrial fibrillation: Secondary | ICD-10-CM | POA: Diagnosis not present

## 2020-11-21 DIAGNOSIS — G459 Transient cerebral ischemic attack, unspecified: Secondary | ICD-10-CM | POA: Diagnosis not present

## 2020-11-21 DIAGNOSIS — I10 Essential (primary) hypertension: Secondary | ICD-10-CM

## 2020-11-21 NOTE — Patient Instructions (Signed)
Medication Instructions:  Your physician recommends that you continue on your current medications as directed. Please refer to the Current Medication list given to you today. *If you need a refill on your cardiac medications before your next appointment, please call your pharmacy*  Lab Work: None ordered. If you have labs (blood work) drawn today and your tests are completely normal, you will receive your results only by: [place] (if you have MyChart) OR A paper copy in the mail If you have any lab test that is abnormal or we need to change your treatment, we will call you to review the results.  Testing/Procedures: None ordered.  Follow-Up: At [facility], you and your health needs are our priority.  As part of our continuing mission to provide you with exceptional heart care, we have created designated Provider Care Teams.  These Care Teams include your primary Cardiologist (physician) and Advanced Practice Providers (APPs -  Physician Assistants and Nurse Practitioners) who all work together to provide you with the care you need, when you need it.  Your next appointment:   Your physician wants you to follow-up in: one year  You may see Vickie Epley, MD or one of the following Advanced Practice Providers on your designated Care Team:   Tommye Standard, [place] Legrand Como "Jonni Sanger" [place], [place] You will receive a reminder letter in the mail two months in advance. If you don't receive a letter, please call our office to schedule the follow-up appointment.

## 2020-11-25 ENCOUNTER — Telehealth: Payer: Self-pay | Admitting: *Deleted

## 2020-11-25 NOTE — Telephone Encounter (Signed)
Received a fax from [place] me they are out of network with the patient's [employer], therefore they cannot process the CPAP request. Order has been resent to [employer] via [facility] portal.

## 2020-11-27 ENCOUNTER — Ambulatory Visit: Payer: Medicare HMO | Admitting: Orthopaedic Surgery

## 2020-12-16 DIAGNOSIS — M5416 Radiculopathy, lumbar region: Secondary | ICD-10-CM | POA: Diagnosis not present

## 2020-12-27 DIAGNOSIS — D692 Other nonthrombocytopenic purpura: Secondary | ICD-10-CM | POA: Diagnosis not present

## 2020-12-27 DIAGNOSIS — Z0000 Encounter for general adult medical examination without abnormal findings: Secondary | ICD-10-CM | POA: Diagnosis not present

## 2020-12-27 DIAGNOSIS — J309 Allergic rhinitis, unspecified: Secondary | ICD-10-CM | POA: Diagnosis not present

## 2020-12-27 DIAGNOSIS — I4891 Unspecified atrial fibrillation: Secondary | ICD-10-CM | POA: Diagnosis not present

## 2020-12-27 DIAGNOSIS — D6869 Other thrombophilia: Secondary | ICD-10-CM | POA: Diagnosis not present

## 2020-12-27 DIAGNOSIS — F3342 Major depressive disorder, recurrent, in full remission: Secondary | ICD-10-CM | POA: Diagnosis not present

## 2020-12-27 DIAGNOSIS — R030 Elevated blood-pressure reading, without diagnosis of hypertension: Secondary | ICD-10-CM | POA: Diagnosis not present

## 2020-12-27 DIAGNOSIS — E7800 Pure hypercholesterolemia, unspecified: Secondary | ICD-10-CM | POA: Diagnosis not present

## 2020-12-27 DIAGNOSIS — J209 Acute bronchitis, unspecified: Secondary | ICD-10-CM | POA: Diagnosis not present

## 2021-01-09 DIAGNOSIS — M47817 Spondylosis without myelopathy or radiculopathy, lumbosacral region: Secondary | ICD-10-CM | POA: Diagnosis not present

## 2021-01-09 DIAGNOSIS — M5416 Radiculopathy, lumbar region: Secondary | ICD-10-CM | POA: Diagnosis not present

## 2021-02-05 ENCOUNTER — Ambulatory Visit: Payer: Medicare HMO | Admitting: Physician Assistant

## 2021-02-05 ENCOUNTER — Encounter: Payer: Self-pay | Admitting: Physician Assistant

## 2021-02-05 DIAGNOSIS — M1711 Unilateral primary osteoarthritis, right knee: Secondary | ICD-10-CM

## 2021-02-05 MED ORDER — LIDOCAINE HCL 1 % IJ SOLN
3.0000 mL | INTRAMUSCULAR | Status: AC | PRN
  Administered 2021-02-05: 3 mL

## 2021-02-05 MED ORDER — METHYLPREDNISOLONE ACETATE 40 MG/ML IJ SUSP
40.0000 mg | INTRAMUSCULAR | Status: AC | PRN
  Administered 2021-02-05: 40 mg via INTRA_ARTICULAR

## 2021-02-05 NOTE — Progress Notes (Signed)
   Procedure Note  Patient: Michelle Johnston             Date of Birth: 12/30/1950           MRN: [id number]             Visit Date: 02/05/2021 HPI: Ms. Washinton returns today due to right knee pain.  She is last seen on 11/12/2021.  She is asking for repeat cortisone injection and aspiration knee.  She states last injection helped.  She is nondiabetic.  She said no recent fevers chills.  No injury.  He had no vaccines in the last 2 weeks. Patient has known osteoarthritis right knee.  Physical exam: Right knee good range of motion.  No abnormal warmth or erythema.  Positive effusion.  Procedures: Visit Diagnoses:  1. Primary osteoarthritis of right knee     Large Joint Inj on 02/05/2021 2:51 PM Indications: pain Details: 22 G 1.5 in needle, superolateral approach  Arthrogram: No  Medications: 3 mL lidocaine 1 %; 40 mg methylPREDNISolone acetate 40 MG/ML Aspirate: 48 mL yellow Outcome: tolerated well, no immediate complications Procedure, treatment alternatives, risks and benefits explained, specific risks discussed. Consent was given by the patient. Immediately prior to procedure a time out was called to verify the correct patient, procedure, equipment, support staff and site/side marked as required. Patient was prepped and draped in the usual sterile fashion.    Plan: She understands she needs to wait at least 3 months between injections.  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  Follow-up as needed.

## 2021-02-26 ENCOUNTER — Ambulatory Visit: Payer: Medicare HMO | Admitting: Orthopaedic Surgery

## 2021-02-26 DIAGNOSIS — H26493 Other secondary cataract, bilateral: Secondary | ICD-10-CM | POA: Diagnosis not present

## 2021-02-26 DIAGNOSIS — H401332 Pigmentary glaucoma, bilateral, moderate stage: Secondary | ICD-10-CM | POA: Diagnosis not present

## 2021-02-26 DIAGNOSIS — Z961 Presence of intraocular lens: Secondary | ICD-10-CM | POA: Diagnosis not present

## 2021-02-26 DIAGNOSIS — H0288B Meibomian gland dysfunction left eye, upper and lower eyelids: Secondary | ICD-10-CM | POA: Diagnosis not present

## 2021-02-26 DIAGNOSIS — H02834 Dermatochalasis of left upper eyelid: Secondary | ICD-10-CM | POA: Diagnosis not present

## 2021-02-26 DIAGNOSIS — D3132 Benign neoplasm of left choroid: Secondary | ICD-10-CM | POA: Diagnosis not present

## 2021-02-26 DIAGNOSIS — H02831 Dermatochalasis of right upper eyelid: Secondary | ICD-10-CM | POA: Diagnosis not present

## 2021-02-26 DIAGNOSIS — H0288A Meibomian gland dysfunction right eye, upper and lower eyelids: Secondary | ICD-10-CM | POA: Diagnosis not present

## 2021-03-07 ENCOUNTER — Other Ambulatory Visit: Payer: Self-pay | Admitting: Family Medicine

## 2021-03-07 DIAGNOSIS — Z1231 Encounter for screening mammogram for malignant neoplasm of breast: Secondary | ICD-10-CM

## 2021-03-17 ENCOUNTER — Other Ambulatory Visit: Payer: Self-pay | Admitting: Cardiology

## 2021-03-17 ENCOUNTER — Other Ambulatory Visit: Payer: Self-pay

## 2021-03-17 ENCOUNTER — Telehealth: Payer: Self-pay | Admitting: Cardiology

## 2021-03-17 DIAGNOSIS — I480 Paroxysmal atrial fibrillation: Secondary | ICD-10-CM

## 2021-03-17 MED ORDER — RIVAROXABAN 20 MG PO TABS: 20.0000 mg | ORAL_TABLET | Freq: Every day | ORAL | 3 refills | Status: DC

## 2021-03-17 NOTE — Telephone Encounter (Signed)
Prescription refill request for Xarelto received.  Indication:Afib Last office visit:9/22 Weight:71.2 kg Age:71 Scr:0.8 CrCl:71.45 ml/min  Prescription refilled

## 2021-03-17 NOTE — Telephone Encounter (Signed)
°*  STAT* If patient is at the pharmacy, call can be transferred to refill team.   1. Which medications need to be refilled? (please list name of each medication and dose if known) rivaroxaban (XARELTO) 20 MG TABS tablet    2. Which pharmacy/location (including street and city if local pharmacy) is medication to be sent to?[place], [place] [address]  3. Do they need a 30 day or 90 day supply? 90 ds

## 2021-03-21 ENCOUNTER — Telehealth: Payer: Self-pay | Admitting: Cardiology

## 2021-03-21 NOTE — Telephone Encounter (Signed)
Patient called and wanted to know about her CPAP machine. Says that it has been over 3 months and no one has contacted her back to give her an update

## 2021-03-26 ENCOUNTER — Other Ambulatory Visit: Payer: Self-pay

## 2021-03-26 ENCOUNTER — Ambulatory Visit
Admission: RE | Admit: 2021-03-26 | Discharge: 2021-03-26 | Disposition: A | Discharge status: Home / Self Care | Payer: Medicare HMO | Source: Ambulatory Visit | Attending: Family Medicine | Admitting: Family Medicine

## 2021-03-26 DIAGNOSIS — Z1231 Encounter for screening mammogram for malignant neoplasm of breast: Secondary | ICD-10-CM

## 2021-04-01 DIAGNOSIS — F3342 Major depressive disorder, recurrent, in full remission: Secondary | ICD-10-CM | POA: Diagnosis not present

## 2021-04-01 DIAGNOSIS — E7800 Pure hypercholesterolemia, unspecified: Secondary | ICD-10-CM | POA: Diagnosis not present

## 2021-04-01 DIAGNOSIS — I4891 Unspecified atrial fibrillation: Secondary | ICD-10-CM | POA: Diagnosis not present

## 2021-04-01 DIAGNOSIS — M4800 Spinal stenosis, site unspecified: Secondary | ICD-10-CM | POA: Diagnosis not present

## 2021-04-01 DIAGNOSIS — D6869 Other thrombophilia: Secondary | ICD-10-CM | POA: Diagnosis not present

## 2021-04-01 DIAGNOSIS — J42 Unspecified chronic bronchitis: Secondary | ICD-10-CM | POA: Diagnosis not present

## 2021-04-01 DIAGNOSIS — I10 Essential (primary) hypertension: Secondary | ICD-10-CM | POA: Diagnosis not present

## 2021-04-03 DIAGNOSIS — M5416 Radiculopathy, lumbar region: Secondary | ICD-10-CM | POA: Diagnosis not present

## 2021-04-08 NOTE — Progress Notes (Deleted)
Cardiology Office Note:    Date:  04/08/2021   ID:  [name], [name] [birth year], MRN [id number]  PCP:  Mayra Neer, MD  Cardiologist:  None  Electrophysiologist:  Vickie Epley, MD   Referring MD: Mayra Neer, MD   No chief complaint on file.    History of Present Illness:    Michelle Johnston is a 71 y.o. female with a hx of paroxysmal atrial fibrillation, moderate MR, moderate AI, tobacco use who is referred by Dr. Brigitte Pulse for evaluation of atrial fibrillation.  Previously followed with Dr. Virgina Jock in cardiology, last seen 02/07/2020.  She was started on flecainide for A. fib on 12/8, subsequently developed dizziness and blurry vision along with left upper arm numbness.  She was sent to the ED for concern for TIA/CVA.  MRI negative for stroke.  Neurology was consulted, thought that symptoms likely are due to adverse reaction to flecainide as opposed to TIA.  She had successful cardioversion on 08/15/2019 but did not sustain sinus rhythm, was back in A. fib at follow-up visit on 08/24/2019.  Echocardiogram on 03/12/2019 showed EF 55 to 60%, mild LVH, grade 1 diastolic dysfunction, moderate AI, moderate MR.  Lexiscan Myoview on 08/07/2019 showed normal perfusion, EF 43%.  ABIs on 01/02/2020 were mildly reduced in RLE (0.92) and normal in LLE (1.0). repeat echocardiogram 02/06/2020 showed EF 50 to 55%, moderate AI, moderate MR, moderate TR, severe left atrial dilatation, mild right atrial dilatation.  Echocardiogram 06/24/2020 showed EF 55 to 60%, normal RV function, moderate left atrial dilatation, severe right atrial dilatation, mild MR, mild AI, mild dilatation of ascending aorta measuring 39 mm.   Since last clinic visit,   she experiences brief lightheadedness and shortness of breath. After her ablation, she experiences fluttering on the left side of her chest that lasts for a few minutes. Denies any LE edema, syncope or palpitations. She use to do water aerobics for 5 days out the week  however has stopped due to back issues. She recently has joined [employer] and walks in the pool.  She takes Rivaroxban, denies any bleeding issues.   Past Medical History:  Diagnosis Date   Arthritis    Bradycardia    Bronchitis    Depression    Glaucoma    Hypertension    Seasonal allergies    Wears glasses     Past Surgical History:  Procedure Laterality Date   ABDOMINAL HYSTERECTOMY  1990   ATRIAL FIBRILLATION ABLATION N/A 08/12/2020   Procedure: ATRIAL FIBRILLATION ABLATION;  Surgeon: Vickie Epley, MD;  Location: [place] [facility];  Service: Cardiovascular;  Laterality: N/A;   BREAST BIOPSY Right    benign   CARDIOVERSION N/A 08/15/2019   Procedure: CARDIOVERSION;  Surgeon: Nigel Mormon, MD;  Location: [place] [facility];  Service: Cardiovascular;  Laterality: N/A;   COLONOSCOPY     meniscus tear knee     left knee   NASAL SINUS SURGERY  1975   ORIF ANKLE FRACTURE  2009   left   TOTAL KNEE ARTHROPLASTY Left 02/03/2019   Procedure: LEFT TOTAL KNEE ARTHROPLASTY;  Surgeon: Mcarthur Rossetti, MD;  Location: [facility];  Service: Orthopedics;  Laterality: Left;   TURBINATE REDUCTION Bilateral 06/04/2014   Procedure: BILATERAL TURBINATE REDUCTION;  Surgeon: Leta Baptist, MD;  Location: [place];  Service: ENT;  Laterality: Bilateral;   TYMPANOSTOMY TUBE PLACEMENT     left    Current Medications: No outpatient medications have been marked as  taking for the 04/10/21 encounter (Appointment) with Donato Heinz, MD.     Allergies:   Ciprofibrate, Flecainide, Other, Ceclor [cefaclor], Ciprofloxacin, and Flagyl [metronidazole]   Social History   Socioeconomic History   Marital status: Married    Spouse name: Not on file   Number of children: 0   Years of education: Not on file   Highest education level: Not on file  Occupational History   Not on file  Tobacco Use   Smoking status: Former    Packs/day: 2.00    Years: 30.00    Pack years: 60.00     Types: Cigarettes    Quit date: 05/28/1998    Years since quitting: 22.8   Smokeless tobacco: Never  Vaping Use   Vaping Use: Never used  Substance and Sexual Activity   Alcohol use: Yes    Alcohol/week: 3.0 - 4.0 standard drinks    Types: 2 Glasses of wine, 1 - 2 Cans of beer per week    Comment: daily-wine daily-1 glass   Drug use: No   Sexual activity: Not on file  Other Topics Concern   Not on file  Social History Narrative   Not on file   Social Determinants of Health   Financial Resource Strain: Not on file  Food Insecurity: Not on file  Transportation Needs: Not on file  Physical Activity: Not on file  Stress: Not on file  Social Connections: Not on file     Family History: The patient's family history includes Hyperlipidemia in her mother; Hypertension in her mother; Lung cancer in her father; Lymphoma in her mother.  ROS:   Please see the history of present illness.     (+) heart flutters (+) lightheadedness (+) shortness of breath  All other systems reviewed and are negative.  EKGs/Labs/Other Studies Reviewed:    The following studies were reviewed today:  Echo 04/22: IMPRESSIONS    1. Left ventricular ejection fraction, by estimation, is 55 to 60%. The  left ventricle has normal function. The left ventricle has no regional  wall motion abnormalities. There is mild left ventricular hypertrophy.  Left ventricular diastolic parameters  are indeterminate.   2. Right ventricular systolic function is normal. The right ventricular  size is normal. There is normal pulmonary artery systolic pressure. The  estimated right ventricular systolic pressure is 45.8 mmHg.   3. Left atrial size was moderately dilated.   4. Right atrial size was severely dilated.   5. The mitral valve is normal in structure. Mild mitral valve  regurgitation.   6. The aortic valve is tricuspid. Aortic valve regurgitation is mild. No  aortic stenosis is present.   7. Aortic  dilatation noted. There is mild dilatation of the ascending  aorta, measuring 39 mm.   8. The inferior vena cava is normal in size with greater than 50%  respiratory variability, suggesting right atrial pressure of 3 mmHg.   EKG:   07/22: no EKG was ordered today. 01/22: atrial fibrillation, rate 85, incomplete right bundle branch block  Recent Labs: 07/31/2020: BUN 16; Creatinine, Ser 0.83; Hemoglobin 12.6; Platelets 254; Potassium 4.5; Sodium 136  Recent Lipid Panel    Component Value Date/Time   CHOL 214 (H) 09/23/2020 1213   TRIG 109 09/23/2020 1213   HDL 104 09/23/2020 1213   CHOLHDL 2.1 09/23/2020 1213   LDLCALC 92 09/23/2020 1213    Physical Exam:    VS:  There were no vitals taken for this visit.  Wt Readings from Last 3 Encounters:  11/21/20 157 lb (71.2 kg)  10/29/20 155 lb (70.3 kg)  09/23/20 153 lb 3.2 oz (69.5 kg)     GEN:  in no acute distress HEENT: Normal NECK: No JVD; No carotid bruits LYMPHATICS: No lymphadenopathy CARDIAC: Normal rate, RRR, no murmurs RESPIRATORY:  Clear to auscultation without rales, wheezing or rhonchi  ABDOMEN: Soft, non-tender, non-distended MUSCULOSKELETAL:  No edema; No deformity  SKIN: Warm and dry NEUROLOGIC:  Alert and oriented x 3 PSYCHIATRIC:  Normal affect  ASSESSMENT:    No diagnosis found.   PLAN:    Atrial fibrillation: Persistent. CHA2DS2-VASc score 3 (hypertension, age, female).  On Xarelto 20 mg daily.  Successful cardioversion in June but went back into A. fib.  Was tried on flecainide but had adverse reaction.  Underwent ablation with Dr. Quentin Ore on 08/12/2020.  Appears to be maintaining sinus rhythm. -Continue Xarelto -Continue metoprolol 25 mg twice daily  Mtral regurgitation: Moderate by report on echo 02/06/2020, repeat echo on 06/24/2020 showed mild MR  Aortic regurgitation: Moderate by report on echo 02/06/2020 repeat echo on 06/24/2020 showed mild AI  Hypertension: On amlodipine 5 mg daily and  metoprolol 25 mg twice daily.  Appears controlled  PAD: Mild PAD RLE (ABI 0.92), normal in LLE (ABI 1.0 ).  On Xarelto.  On rosuvastatin 10 mg daily.  Hyperlipidemia: LDL 122 on 12/14/2019.  Started on rosuvastatin 10 mg daily, will recheck lipid pnael  RTC in ***  Medication Adjustments/Labs and Tests Ordered: Current medicines are reviewed at length with the patient today.  Concerns regarding medicines are outlined above.  No orders of the defined types were placed in this encounter.   No orders of the defined types were placed in this encounter.    There are no Patient Instructions on file for this visit.    Signed, Donato Heinz, MD  04/08/2021 5:13 PM    [facility]

## 2021-04-09 NOTE — Telephone Encounter (Signed)
Patient called again to follow up on her CPAP Machine

## 2021-04-10 ENCOUNTER — Ambulatory Visit: Payer: Medicare HMO | Admitting: Cardiology

## 2021-04-11 DIAGNOSIS — M5416 Radiculopathy, lumbar region: Secondary | ICD-10-CM | POA: Diagnosis not present

## 2021-04-17 NOTE — Telephone Encounter (Signed)
Returned a call to the patient in reference to her CPAP machine. She was informed that after speaking with Tea at [employer], she said that they reached out to her in November and did not get a return call. She assured me that she will have Shay, RT to contact the patient again tomorrow. She is not in the office today. Patient was give contact # to [employer] so that she can follow up if she does not get a call from them.

## 2021-05-05 ENCOUNTER — Telehealth: Payer: Self-pay | Admitting: Cardiology

## 2021-05-05 NOTE — Telephone Encounter (Signed)
Called patient left message on personal voice mail I will send message to [place] for advice. ?

## 2021-05-05 NOTE — Telephone Encounter (Signed)
Patient called about her CPAP machine, she stated the air pressure to high for her.  She states it's messing with her sinus. She hasn't be able to use it all weekend.  ?

## 2021-05-05 NOTE — Telephone Encounter (Signed)
Patient would like to know if she is a good candidate for the watchman.  ?

## 2021-05-06 DIAGNOSIS — M47817 Spondylosis without myelopathy or radiculopathy, lumbosacral region: Secondary | ICD-10-CM | POA: Diagnosis not present

## 2021-05-06 DIAGNOSIS — M5416 Radiculopathy, lumbar region: Secondary | ICD-10-CM | POA: Diagnosis not present

## 2021-05-06 NOTE — Telephone Encounter (Signed)
The watchman procedure is generally reserved for patients who cannot be on anticoagulation.  If she is tolerating anticoagulation without bleeding issues, would favor continuing her anticoagulation ?

## 2021-05-07 ENCOUNTER — Ambulatory Visit: Payer: Medicare HMO | Admitting: Physician Assistant

## 2021-05-07 NOTE — Telephone Encounter (Signed)
Patient made aware.  She states she is tolerating xarelto okay but is unable to take her arthritis medication with it.   She is wondering if this would be an option for her.   ? ?Routed to MD to review. ?

## 2021-05-07 NOTE — Telephone Encounter (Signed)
Returned call to patient-reports she believes CPAP pressure is too high.  She wakes up with sinuses swollen and congested.  Improves during the day.  States she didn't use the other night and did not have these issues.   ? ?Advised would route to sleep coordinator to assist with download and possible changes.   ?

## 2021-05-07 NOTE — Telephone Encounter (Signed)
Patient is calling about her CPAP machine the pressure is too high for her, she hasn't be able to use since Friday.  She would like for Dr. Gardiner Rhyme to turn it down for her.  ?

## 2021-05-08 ENCOUNTER — Telehealth: Payer: Self-pay | Admitting: *Deleted

## 2021-05-08 NOTE — Telephone Encounter (Signed)
Received a fax from [place] me the patient was set up on CPAP 04/24/21. ?

## 2021-05-08 NOTE — Telephone Encounter (Signed)
Left message to call back  

## 2021-05-08 NOTE — Telephone Encounter (Signed)
She sees Dr. Quentin Ore for her A-fib, recommend discussing this with him as he does the Watchman procedure ?

## 2021-05-12 NOTE — Telephone Encounter (Signed)
Scheduled the patient 07/10/21 to discuss [place] with Dr. Quentin Ore. ?She was grateful for call and agrees with plan.  ?

## 2021-05-13 ENCOUNTER — Telehealth: Payer: Self-pay | Admitting: Cardiovascular Disease

## 2021-05-13 NOTE — Telephone Encounter (Signed)
Patient states she is having problems with her CPAP air pressure.  ?

## 2021-05-15 NOTE — Telephone Encounter (Signed)
Returned a call to the patient in reference to her CPAP issues. She states that she wakes up with sinus congestion. I changed her climate control from Auto and entered manual settings. Humidifier set on 3 and temp changed to 72 degrees. It was also recommended that she try using saline spray to help with the congestion. Call back if her symptoms persist. ?

## 2021-05-21 ENCOUNTER — Ambulatory Visit: Payer: Medicare HMO | Admitting: Physician Assistant

## 2021-05-21 ENCOUNTER — Encounter: Payer: Self-pay | Admitting: Physician Assistant

## 2021-05-21 ENCOUNTER — Telehealth: Payer: Self-pay | Admitting: *Deleted

## 2021-05-21 DIAGNOSIS — M1711 Unilateral primary osteoarthritis, right knee: Secondary | ICD-10-CM | POA: Diagnosis not present

## 2021-05-21 MED ORDER — METHYLPREDNISOLONE ACETATE 40 MG/ML IJ SUSP
40.0000 mg | INTRAMUSCULAR | Status: AC | PRN
  Administered 2021-05-21: 40 mg via INTRA_ARTICULAR

## 2021-05-21 MED ORDER — LIDOCAINE HCL 1 % IJ SOLN
4.0000 mL | INTRAMUSCULAR | Status: AC | PRN
  Administered 2021-05-21: 4 mL

## 2021-05-21 NOTE — Telephone Encounter (Signed)
Patient notified Dr Claiborne Billings informed she feels like she is not getting any air. He reviewed a download and made some pressure changes accordingly. ?

## 2021-05-21 NOTE — Progress Notes (Signed)
? ?  Procedure Note ? ?Patient: Michelle Johnston             ?Date of Birth: Sep 19, 1950           ?MRN: [id number]             ?Visit Date: 05/21/2021 ?HPI: Michelle Johnston returns today due to right knee pain.  We last saw her on 02/05/2021 and she was given a cortisone injection at that time.  She states that the injection lasted until few weeks ago.  She has had no fevers chills or injuries.  She is on chronic anticoagulation due to A-fib.  She still takes occasional Mobic due to the pain in her knee. ? ?Physical exam: Right knee full range of motion.  No abnormal warmth erythema.  Positive effusion. ? ?Procedures: ?Visit Diagnoses:  ?1. Primary osteoarthritis of right knee   ? ? ?Large Joint Inj: R knee on 05/21/2021 1:19 PM ?Indications: pain ?Details: 22 G 1.5 in needle, superolateral approach ? ?Arthrogram: No ? ?Medications: 4 mL lidocaine 1 %; 40 mg methylPREDNISolone acetate 40 MG/ML ?Aspirate: 58 mL yellow ?Outcome: tolerated well, no immediate complications ?Procedure, treatment alternatives, risks and benefits explained, specific risks discussed. Consent was given by the patient. Immediately prior to procedure a time out was called to verify the correct patient, procedure, equipment, support staff and site/side marked as required. Patient was prepped and draped in the usual sterile fashion.  ? ? ?Plan: She understands to wait at least 3 months between injections.  She tolerated the aspiration and injection in the knee today.  Ace bandage was applied she will remove this prior to residing to  bed this evening. ? ? ?

## 2021-05-29 DIAGNOSIS — H401332 Pigmentary glaucoma, bilateral, moderate stage: Secondary | ICD-10-CM | POA: Diagnosis not present

## 2021-06-05 ENCOUNTER — Telehealth: Payer: Self-pay | Admitting: *Deleted

## 2021-06-05 NOTE — Telephone Encounter (Signed)
Left message that her call has been returned. Call me again if assistance still needed. ?

## 2021-06-19 ENCOUNTER — Telehealth: Payer: Self-pay | Admitting: *Deleted

## 2021-06-19 NOTE — Telephone Encounter (Signed)
Patient called in to say that she is still having issues with using her CPAP machine. She states that she has had several sinus surgeries as well as a tube in her ear to keep her from getting sinus infections. She states the CPAP machine is "drying out my sinuses." She has called in on 2 other occassions and adjustments were made and noted in [place]. I told her that I will contact [employer] to have a RT to reach out to her. Hopefully they will have a recommendation for what else she can do. I also recommended that she reach out to her ENT for advice as well. ?

## 2021-06-29 NOTE — Progress Notes (Addendum)
?Cardiology Office Note:   ? ?Date:  07/01/2021  ? ?ID:  Michelle Johnston, DOB February 27, 1951, MRN [id number] ? ?PCP:  Mayra Neer, MD  ?Cardiologist:  Donato Heinz, MD  ?Electrophysiologist:  Vickie Epley, MD  ? ?Referring MD: Mayra Neer, MD  ? ?Chief Complaint  ?Patient presents with  ? Atrial Fibrillation  ? ? ? ?History of Present Illness:   ? ?Michelle Johnston is a 71 y.o. female with a hx of paroxysmal atrial fibrillation, moderate MR, moderate AI, tobacco use, OSA who is referred by Dr. Brigitte Pulse for evaluation of atrial fibrillation.  Previously followed with Dr. Virgina Jock in cardiology, last seen 02/07/2020.  She was started on flecainide for A. fib on 12/8, subsequently developed dizziness and blurry vision along with left upper arm numbness.  She was sent to the ED for concern for TIA/CVA.  MRI negative for stroke.  Neurology was consulted, thought that symptoms likely are due to adverse reaction to flecainide as opposed to TIA.  She had successful cardioversion on 08/15/2019 but did not sustain sinus rhythm, was back in A. fib at follow-up visit on 08/24/2019. ? ?Echocardiogram on 03/12/2019 showed EF 55 to 60%, mild LVH, grade 1 diastolic dysfunction, moderate AI, moderate MR.  Lexiscan Myoview on 08/07/2019 showed normal perfusion, EF 43%.  ABIs on 01/02/2020 were mildly reduced in RLE (0.92) and normal in LLE (1.0). repeat echocardiogram 02/06/2020 showed EF 50 to 55%, moderate AI, moderate MR, moderate TR, severe left atrial dilatation, mild right atrial dilatation.  Echocardiogram 06/24/2020 showed EF 55 to 60%, normal RV function, moderate left atrial dilatation, severe right atrial dilatation, mild MR, mild AI, mild dilatation of ascending aorta measuring 39 mm. ? ? ?Since last clinic visit, she reports he has been doing well.  Denies any chest pain, dyspnea, lightheadedness, syncope, lower extremity edema, or palpitations.  She is requesting referral to Dr. Quentin Ore for [facility] evaluation, as she has  been unable to take her meloxicam to treat her arthritis while on Xarelto.  Diagnosed with OSA, but has been unable to tolerate CPAP.   ? ?BP Readings from Last 3 Encounters:  ?07/01/21 (!) 154/80  ?11/21/20 130/70  ?09/23/20 122/60  ? ? ? ?Past Medical History:  ?Diagnosis Date  ? Arthritis   ? Bradycardia   ? Bronchitis   ? Depression   ? Glaucoma   ? Hypertension   ? Seasonal allergies   ? Wears glasses   ? ? ?Past Surgical History:  ?Procedure Laterality Date  ? ABDOMINAL HYSTERECTOMY  1990  ? ATRIAL FIBRILLATION ABLATION N/A 08/12/2020  ? Procedure: ATRIAL FIBRILLATION ABLATION;  Surgeon: Vickie Epley, MD;  Location: [facility];  Service: Cardiovascular;  Laterality: N/A;  ? BREAST BIOPSY Right   ? benign  ? CARDIOVERSION N/A 08/15/2019  ? Procedure: CARDIOVERSION;  Surgeon: Nigel Mormon, MD;  Location: [facility];  Service: Cardiovascular;  Laterality: N/A;  ? COLONOSCOPY    ? meniscus tear knee    ? left knee  ? NASAL SINUS SURGERY  1975  ? ORIF ANKLE FRACTURE  2009  ? left  ? TOTAL KNEE ARTHROPLASTY Left 02/03/2019  ? Procedure: LEFT TOTAL KNEE ARTHROPLASTY;  Surgeon: Mcarthur Rossetti, MD;  Location: [facility];  Service: Orthopedics;  Laterality: Left;  ? TURBINATE REDUCTION Bilateral 06/04/2014  ? Procedure: BILATERAL TURBINATE REDUCTION;  Surgeon: Leta Baptist, MD;  Location: [facility];  Service: ENT;  Laterality: Bilateral;  ? TYMPANOSTOMY TUBE PLACEMENT    ?  left  ? ? ?Current Medications: ?Current Meds  ?Medication Sig  ? acetaminophen (TYLENOL) 500 MG tablet Take 1,000 mg by mouth every 6 (six) hours as needed. pain  ? amLODipine (NORVASC) 5 MG tablet Take 5 mg by mouth at bedtime.   ? b complex vitamins tablet Take 1 tablet by mouth daily.  ? buPROPion (WELLBUTRIN XL) 300 MG 24 hr tablet Take 150 mg by mouth daily.  ? calcium carbonate (OS-CAL - DOSED IN MG OF ELEMENTAL CALCIUM) 1250 (500 Ca) MG tablet Take 1 tablet by mouth daily with breakfast.  ? carvedilol (COREG)  6.25 MG tablet Take 1 tablet (6.25 mg total) by mouth 2 (two) times daily.  ? cetirizine (ZYRTEC) 10 MG tablet Take 10 mg by mouth at bedtime.  ? Cholecalciferol (VITAMIN D) 50 MCG (2000 UT) tablet Take 2,000 Units by mouth daily.  ? FLUoxetine (PROZAC) 40 MG capsule Take 40 mg by mouth daily.  ? Fluticasone-Salmeterol (ADVAIR) 250-50 MCG/DOSE AEPB Inhale 1 puff into the lungs 2 (two) times daily as needed (asthma).  ? gabapentin (NEURONTIN) 300 MG capsule Take 300 mg by mouth 2 (two) times daily. Takes it once daily or twice daily  ? guaiFENesin (MUCINEX) 600 MG 12 hr tablet Take 600 mg by mouth daily as needed for cough or to loosen phlegm.  ? latanoprost (XALATAN) 0.005 % ophthalmic solution Place 1 drop into both eyes every morning.   ? meloxicam (MOBIC) 15 MG tablet Take 15 mg by mouth daily as needed for pain.  ? meloxicam (MOBIC) 15 MG tablet Take 1 tablet by mouth daily.  ? montelukast (SINGULAIR) 10 MG tablet Take 10 mg by mouth at bedtime.  ? Multiple Vitamins-Minerals (MULTIVITAMIN WITH MINERALS) tablet Take 1 tablet by mouth daily.  ? Omega-3 Fatty Acids (FISH OIL) 1000 MG CAPS Take 1,000 mg by mouth daily.  ? omeprazole (PRILOSEC) 20 MG capsule Take 20 mg by mouth daily as needed (acid reflux).  ? rivaroxaban (XARELTO) 20 MG TABS tablet Take 1 tablet (20 mg total) by mouth daily with supper.  ? rosuvastatin (CRESTOR) 10 MG tablet TAKE 1 TABLET (10 MG TOTAL) BY MOUTH DAILY.  ? vitamin C (ASCORBIC ACID) 500 MG tablet Take 500 mg by mouth daily.  ? [DISCONTINUED] metoprolol tartrate (LOPRESSOR) 25 MG tablet TAKE 1 TABLET (25 MG TOTAL) BY MOUTH 2 (TWO) TIMES DAILY.  ?  ? ?Allergies:   Ciprofibrate, Flecainide, Other, Ceclor [cefaclor], Ciprofloxacin, and Flagyl [metronidazole]  ? ?Social History  ? ?Socioeconomic History  ? Marital status: Married  ?  Spouse name: Not on file  ? Number of children: 0  ? Years of education: Not on file  ? Highest education level: Not on file  ?Occupational History  ? Not on  file  ?Tobacco Use  ? Smoking status: Former  ?  Packs/day: 2.00  ?  Years: 30.00  ?  Pack years: 60.00  ?  Types: Cigarettes  ?  Quit date: 05/28/1998  ?  Years since quitting: 23.1  ? Smokeless tobacco: Never  ?Vaping Use  ? Vaping Use: Never used  ?Substance and Sexual Activity  ? Alcohol use: Yes  ?  Alcohol/week: 3.0 - 4.0 standard drinks  ?  Types: 2 Glasses of wine, 1 - 2 Cans of beer per week  ?  Comment: daily-wine daily-1 glass  ? Drug use: No  ? Sexual activity: Not on file  ?Other Topics Concern  ? Not on file  ?Social History Narrative  ? Not  on file  ? ?Social Determinants of Health  ? ?Financial Resource Strain: Not on file  ?Food Insecurity: Not on file  ?Transportation Needs: Not on file  ?Physical Activity: Not on file  ?Stress: Not on file  ?Social Connections: Not on file  ?  ? ?Family History: ?The patient's family history includes Hyperlipidemia in her mother; Hypertension in her mother; Lung cancer in her father; Lymphoma in her mother. ? ?ROS:   ?Please see the history of present illness.    ? ?All other systems reviewed and are negative. ? ?EKGs/Labs/Other Studies Reviewed:   ? ?The following studies were reviewed today: ? ?Echo 04/22: ?IMPRESSIONS  ? ? 1. Left ventricular ejection fraction, by estimation, is 55 to 60%. The  ?left ventricle has normal function. The left ventricle has no regional  ?wall motion abnormalities. There is mild left ventricular hypertrophy.  ?Left ventricular diastolic parameters  ?are indeterminate.  ? 2. Right ventricular systolic function is normal. The right ventricular  ?size is normal. There is normal pulmonary artery systolic pressure. The  ?estimated right ventricular systolic pressure is 58.5 mmHg.  ? 3. Left atrial size was moderately dilated.  ? 4. Right atrial size was severely dilated.  ? 5. The mitral valve is normal in structure. Mild mitral valve  ?regurgitation.  ? 6. The aortic valve is tricuspid. Aortic valve regurgitation is mild. No  ?aortic  stenosis is present.  ? 7. Aortic dilatation noted. 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  ? 8. The inferior vena cava is normal in size with greater than 50%  ?respiratory

## 2021-06-30 DIAGNOSIS — I10 Essential (primary) hypertension: Secondary | ICD-10-CM | POA: Diagnosis not present

## 2021-06-30 DIAGNOSIS — D6869 Other thrombophilia: Secondary | ICD-10-CM | POA: Diagnosis not present

## 2021-06-30 DIAGNOSIS — G4733 Obstructive sleep apnea (adult) (pediatric): Secondary | ICD-10-CM | POA: Diagnosis not present

## 2021-06-30 DIAGNOSIS — F3342 Major depressive disorder, recurrent, in full remission: Secondary | ICD-10-CM | POA: Diagnosis not present

## 2021-06-30 DIAGNOSIS — I4891 Unspecified atrial fibrillation: Secondary | ICD-10-CM | POA: Diagnosis not present

## 2021-06-30 DIAGNOSIS — E7800 Pure hypercholesterolemia, unspecified: Secondary | ICD-10-CM | POA: Diagnosis not present

## 2021-06-30 DIAGNOSIS — J42 Unspecified chronic bronchitis: Secondary | ICD-10-CM | POA: Diagnosis not present

## 2021-07-01 ENCOUNTER — Ambulatory Visit: Payer: Medicare HMO | Admitting: Cardiology

## 2021-07-01 ENCOUNTER — Encounter: Payer: Self-pay | Admitting: Cardiology

## 2021-07-01 ENCOUNTER — Telehealth: Payer: Self-pay | Admitting: *Deleted

## 2021-07-01 VITALS — BP 154/80 | HR 55 | Ht 65.0 in | Wt 153.8 lb

## 2021-07-01 DIAGNOSIS — I351 Nonrheumatic aortic (valve) insufficiency: Secondary | ICD-10-CM | POA: Diagnosis not present

## 2021-07-01 DIAGNOSIS — I4819 Other persistent atrial fibrillation: Secondary | ICD-10-CM | POA: Diagnosis not present

## 2021-07-01 DIAGNOSIS — I739 Peripheral vascular disease, unspecified: Secondary | ICD-10-CM | POA: Diagnosis not present

## 2021-07-01 DIAGNOSIS — G4733 Obstructive sleep apnea (adult) (pediatric): Secondary | ICD-10-CM

## 2021-07-01 DIAGNOSIS — I340 Nonrheumatic mitral (valve) insufficiency: Secondary | ICD-10-CM

## 2021-07-01 DIAGNOSIS — E785 Hyperlipidemia, unspecified: Secondary | ICD-10-CM

## 2021-07-01 MED ORDER — ROSUVASTATIN CALCIUM 20 MG PO TABS: 20.0000 mg | ORAL_TABLET | Freq: Every day | ORAL | 3 refills | Status: DC

## 2021-07-01 MED ORDER — CARVEDILOL 6.25 MG PO TABS: 6.2500 mg | ORAL_TABLET | Freq: Two times a day (BID) | ORAL | 3 refills | Status: DC

## 2021-07-01 NOTE — Telephone Encounter (Signed)
Left message to call back ? ?PCP labs reviewed by Dr. Lamount Cranker increasing rosuvastatin to 20 mg daily.  Rx updated. ?

## 2021-07-01 NOTE — Telephone Encounter (Signed)
Patient advised of increased in rosuvastatin to 20 mg a day and that new prescription was sent to her pharmacy. She voiced understanding. ?

## 2021-07-01 NOTE — Patient Instructions (Addendum)
Medication Instructions:  ?STOP metoprolol ?START carvedilol (Coreg) 6.25 mg two times daily ?INCREASE rosuvastatin (Crestor) to 20 mg daily ? ?Please check your blood pressure at home twice daily, write it down.  Call the office or send message via Mychart with the readings in 1-2 weeks for Dr. Gardiner Rhyme to review.  ? ?*If you need a refill on your cardiac medications before your next appointment, please call your pharmacy* ? ?Follow-Up: ?At [facility], you and your health needs are our priority.  As part of our continuing mission to provide you with exceptional heart care, we have created designated Provider Care Teams.  These Care Teams include your primary Cardiologist (physician) and Advanced Practice Providers (APPs -  Physician Assistants and Nurse Practitioners) who all work together to provide you with the care you need, when you need it. ? ?We recommend signing up for the patient portal called "MyChart".  Sign up information is provided on this After Visit Summary.  MyChart is used to connect with patients for Virtual Visits (Telemedicine).  Patients are able to view lab/test results, encounter notes, upcoming appointments, etc.  Non-urgent messages can be sent to your provider as well.   ?To learn more about what you can do with MyChart, go to [email or web address].   ? ?Your next appointment:   ?6 month(s) ? ?The format for your next appointment:   ?In Person ? ?Provider:   ?Dr. Gardiner Rhyme ? ?Other Instructions ?You have been referred to: ENT to discuss inspire for sleep apnea ? ? ?Important Information About Sugar ? ? ? ? ? ? ?

## 2021-07-01 NOTE — Telephone Encounter (Signed)
Patient was returning call. Please advise ?

## 2021-07-01 NOTE — Addendum Note (Signed)
Addended by: Patria Mane A on: 07/01/2021 08:53 AM ? ? Modules accepted: Orders ? ?

## 2021-07-02 DIAGNOSIS — H903 Sensorineural hearing loss, bilateral: Secondary | ICD-10-CM | POA: Diagnosis not present

## 2021-07-02 DIAGNOSIS — H6122 Impacted cerumen, left ear: Secondary | ICD-10-CM | POA: Diagnosis not present

## 2021-07-02 DIAGNOSIS — H7202 Central perforation of tympanic membrane, left ear: Secondary | ICD-10-CM | POA: Diagnosis not present

## 2021-07-02 DIAGNOSIS — H6982 Other specified disorders of Eustachian tube, left ear: Secondary | ICD-10-CM | POA: Diagnosis not present

## 2021-07-07 DIAGNOSIS — H6522 Chronic serous otitis media, left ear: Secondary | ICD-10-CM | POA: Diagnosis not present

## 2021-07-07 DIAGNOSIS — H6982 Other specified disorders of Eustachian tube, left ear: Secondary | ICD-10-CM | POA: Diagnosis not present

## 2021-07-09 NOTE — H&P (View-Only) (Signed)
Electrophysiology Office Follow up Visit Note:    Date:  07/29/2021   ID:  Michelle Johnston, Michelle Johnston [birth year], MRN [id number]  PCP:  Mayra Neer, MD  [facility] Cardiologist:  Donato Heinz, MD  [facility] Electrophysiologist:  Vickie Epley, MD    Interval History:    Michelle Johnston is a 71 y.o. female who presents for a follow up visit. They were last seen in clinic 11/21/2020.  Since their last appointment, she was seen by Dr. Gardiner Rhyme 07/01/2021 where she was doing well. She requested a referral to EP for a watchman evaluation. She has been unable to take her meloxicam to treat her arthritis while on Xarelto 20 mg daily. She was switched from metoprolol to carvedilol 6.25 mg twice daily for better blood pressure control. It was noted that she had been unable to tolerate her CPAP mask. She was referred to ENT to evaluate for Inspire.  Today, she is accompanied by a family member. She does not believe her antihypertensives are working.  Since her procedure she denies knowing of any recurrent   She endorses sleep apnea and has not been able to tolerate a full face mask on her CPAP due to sinus issues. She wakes up with significant headaches. She has an upcoming appointment with ENT to discuss the [facility] device.  She denies any palpitations, chest pain, shortness of breath, or peripheral edema. No lightheadedness, syncope, orthopnea, or PND.      Past Medical History:  Diagnosis Date   Arthritis    Bradycardia    Bronchitis    Depression    Glaucoma    Hypertension    Seasonal allergies    Wears glasses     Past Surgical History:  Procedure Laterality Date   ABDOMINAL HYSTERECTOMY  1990   ATRIAL FIBRILLATION ABLATION N/A 08/12/2020   Procedure: ATRIAL FIBRILLATION ABLATION;  Surgeon: Vickie Epley, MD;  Location: [place] [facility];  Service: Cardiovascular;  Laterality: N/A;   BREAST BIOPSY Right    benign   CARDIOVERSION N/A 08/15/2019   Procedure:  CARDIOVERSION;  Surgeon: Nigel Mormon, MD;  Location: [place] [facility];  Service: Cardiovascular;  Laterality: N/A;   COLONOSCOPY     meniscus tear knee     left knee   NASAL SINUS SURGERY  1975   ORIF ANKLE FRACTURE  2009   left   TOTAL KNEE ARTHROPLASTY Left 02/03/2019   Procedure: LEFT TOTAL KNEE ARTHROPLASTY;  Surgeon: Mcarthur Rossetti, MD;  Location: [facility];  Service: Orthopedics;  Laterality: Left;   TURBINATE REDUCTION Bilateral 06/04/2014   Procedure: BILATERAL TURBINATE REDUCTION;  Surgeon: Leta Baptist, MD;  Location: [place];  Service: ENT;  Laterality: Bilateral;   TYMPANOSTOMY TUBE PLACEMENT     left    Current Medications: Current Meds  Medication Sig   acetaminophen (TYLENOL) 500 MG tablet Take 1,000 mg by mouth every 6 (six) hours as needed. pain   albuterol (VENTOLIN HFA) 108 (90 Base) MCG/ACT inhaler Inhale 1-2 puffs into the lungs every 6 (six) hours as needed for wheezing or shortness of breath.   amLODipine (NORVASC) 5 MG tablet Take 5 mg by mouth at bedtime.    b complex vitamins tablet Take 1 tablet by mouth daily.   buPROPion (WELLBUTRIN XL) 300 MG 24 hr tablet Take 150 mg by mouth daily.   calcium carbonate (OS-CAL - DOSED IN MG OF ELEMENTAL CALCIUM) 1250 (500 Ca) MG tablet Take 1 tablet by mouth daily  with breakfast.   carvedilol (COREG) 12.5 MG tablet Take 1 tablet (12.5 mg total) by mouth 2 (two) times daily with a meal.   cetirizine (ZYRTEC) 10 MG tablet Take 10 mg by mouth at bedtime.   Cholecalciferol (VITAMIN D) 50 MCG (2000 UT) tablet Take 2,000 Units by mouth daily.   FLUoxetine (PROZAC) 40 MG capsule Take 40 mg by mouth daily.   Fluticasone-Salmeterol (ADVAIR) 250-50 MCG/DOSE AEPB Inhale 1 puff into the lungs 2 (two) times daily as needed (asthma).   gabapentin (NEURONTIN) 300 MG capsule Take 300 mg by mouth 2 (two) times daily. Takes it once daily or twice daily   guaiFENesin (MUCINEX) 600 MG 12 hr tablet Take 600 mg by mouth  daily as needed for cough or to loosen phlegm.   ipratropium (ATROVENT) 0.06 % nasal spray Place 1 spray into both nostrils daily as needed for rhinitis.   latanoprost (XALATAN) 0.005 % ophthalmic solution Place 1 drop into both eyes every morning.    meloxicam (MOBIC) 15 MG tablet Take 7.5 mg by mouth daily.   montelukast (SINGULAIR) 10 MG tablet Take 10 mg by mouth at bedtime.   Multiple Vitamins-Minerals (MULTIVITAMIN WITH MINERALS) tablet Take 1 tablet by mouth daily.   nitroGLYCERIN (NITROSTAT) 0.4 MG SL tablet Place 0.4 mg under the tongue every 5 (five) minutes as needed for chest pain.   Omega-3 Fatty Acids (FISH OIL) 1000 MG CAPS Take 1,000 mg by mouth daily.   omeprazole (PRILOSEC) 20 MG capsule Take 20 mg by mouth daily as needed (acid reflux).   rivaroxaban (XARELTO) 20 MG TABS tablet Take 1 tablet (20 mg total) by mouth daily with supper.   rosuvastatin (CRESTOR) 20 MG tablet Take 1 tablet (20 mg total) by mouth daily.   Turmeric 500 MG CAPS Take 500 mg by mouth daily.   vitamin C (ASCORBIC ACID) 500 MG tablet Take 500 mg by mouth daily.   [DISCONTINUED] carvedilol (COREG) 6.25 MG tablet Take 1 tablet (6.25 mg total) by mouth 2 (two) times daily.     Allergies:   Ciprofibrate, Dexlansoprazole, Flecainide, Other, Cefaclor, Ciprofloxacin, and Metronidazole   Social History   Socioeconomic History   Marital status: Married    Spouse name: Not on file   Number of children: 0   Years of education: Not on file   Highest education level: Not on file  Occupational History   Not on file  Tobacco Use   Smoking status: Former    Packs/day: 2.00    Years: 30.00    Pack years: 60.00    Types: Cigarettes    Quit date: 05/28/1998    Years since quitting: 23.1   Smokeless tobacco: Never  Vaping Use   Vaping Use: Never used  Substance and Sexual Activity   Alcohol use: Yes    Alcohol/week: 3.0 - 4.0 standard drinks    Types: 2 Glasses of wine, 1 - 2 Cans of beer per week     Comment: daily-wine daily-1 glass   Drug use: No   Sexual activity: Not on file  Other Topics Concern   Not on file  Social History Narrative   Not on file   Social Determinants of Health   Financial Resource Strain: Not on file  Food Insecurity: Not on file  Transportation Needs: Not on file  Physical Activity: Not on file  Stress: Not on file  Social Connections: Not on file     Family History: The patient's family history includes Hyperlipidemia in  her mother; Hypertension in her mother; Lung cancer in her father; Lymphoma in her mother.  ROS:   Please see the history of present illness.    (+) Headaches All other systems reviewed and are negative.  EKGs/Labs/Other Studies Reviewed:    The following studies were reviewed today:  08/12/2020  Atrial Fibrillation Ablation CONCLUSIONS: 1. Successful PVI 2. Successful ablation/isolation of the posterior wall 3.  Three-dimensional electroanatomic mapping revealed significant scarring of the left atrium surrounding the pulmonary veins and patchy scarring of the posterior wall 4. Intracardiac echo reveals left common os, trivial pericardial effusion 5. No early apparent complications. 6.  If recurrence of atrial fibrillation, consider antiarrhythmic therapy (likely dofetilide)  08/05/2020  Cardiac CTA IMPRESSION: 1. There is normal pulmonary vein drainage into the left atrium with ostial measurements above.   2. There is no thrombus in the left atrial appendage.   3. The esophagus runs in the left atrial midline and is in close proximity to the left upper and left lower pulmonary vein ostia.   4. No PFO/ASD.   5. Normal coronary origin. Right dominance.   6. CAC score of 0 which is 0 percentile for age-, race-, and sex-matched controls.  06/24/2020   Echo  1. Left ventricular ejection fraction, by estimation, is 55 to 60%. The  left ventricle has normal function. The left ventricle has no regional  wall motion  abnormalities. There is mild left ventricular hypertrophy.  Left ventricular diastolic parameters  are indeterminate.   2. Right ventricular systolic function is normal. The right ventricular  size is normal. There is normal pulmonary artery systolic pressure. The  estimated right ventricular systolic pressure is 19.3 mmHg.   3. Left atrial size was moderately dilated.   4. Right atrial size was severely dilated.   5. The mitral valve is normal in structure. Mild mitral valve  regurgitation.   6. The aortic valve is tricuspid. Aortic valve regurgitation is mild. No  aortic stenosis is present.   7. Aortic dilatation noted. There is mild dilatation of the ascending  aorta, measuring 39 mm.   8. The inferior vena cava is normal in size with greater than 50%  respiratory variability, suggesting right atrial pressure of 3 mmHg.     Recent Labs: 07/14/2021: BUN 17; Creatinine, Ser 0.70; Hemoglobin 12.5; Platelets 306; Potassium 4.7; Sodium 131   Recent Lipid Panel    Component Value Date/Time   CHOL 214 (H) 09/23/2020 1213   TRIG 109 09/23/2020 1213   HDL 104 09/23/2020 1213   CHOLHDL 2.1 09/23/2020 1213   LDLCALC 92 09/23/2020 1213    Physical Exam:    VS:  BP (!) 160/74   Pulse 62   Ht '5\' 5"'$  (1.651 m)   Wt 155 lb 6.4 oz (70.5 kg)   SpO2 96%   BMI 25.86 kg/m     Wt Readings from Last 3 Encounters:  07/10/21 155 lb 6.4 oz (70.5 kg)  07/01/21 153 lb 12.8 oz (69.8 kg)  11/21/20 157 lb (71.2 kg)     GEN: Well nourished, well developed in no acute distress HEENT: Normal NECK: No JVD; No carotid bruits LYMPHATICS: No lymphadenopathy CARDIAC: RRR, no murmurs, rubs, gallops RESPIRATORY:  Clear to auscultation without rales, wheezing or rhonchi  ABDOMEN: Soft, non-tender, non-distended MUSCULOSKELETAL:  No edema; No deformity  SKIN: Warm and dry NEUROLOGIC:  Alert and oriented x 3 PSYCHIATRIC:  Normal affect        ASSESSMENT:    1. Persistent  atrial fibrillation  ([place])   2. Obstructive sleep apnea (adult) (pediatric)   3. TIA (transient ischemic attack)    PLAN:    In order of problems listed above:  #Persistent atrial fibrillation #TIA hx  I have seen Michelle Johnston in the office today who is being considered for a Watchman left atrial appendage closure device. I believe they will benefit from this procedure given their history of atrial fibrillation, CHA2DS2-VASc score of 3 and unadjusted ischemic stroke rate of 3.2% per year. Unfortunately, the patient is not felt to be a long term anticoagulation candidate secondary to chronic use of NSAIDs secondary to arthritis. The patient's chart has been reviewed and I feel that they would be a candidate for short term oral anticoagulation after Watchman implant.   It is my belief that after undergoing a LAA closure procedure, Michelle Johnston will not need long term anticoagulation which eliminates anticoagulation side effects and major bleeding risk.   Procedural risks for the Watchman implant have been reviewed with the patient including a 0.5% risk of stroke, <1% risk of perforation and <1% risk of device embolization.    The published clinical data on the safety and effectiveness of WATCHMAN include but are not limited to the following: - Holmes DR, Mechele Claude, Sick P et al. for the PROTECT AF Investigators. Percutaneous closure of the left atrial appendage versus warfarin therapy for prevention of stroke in patients with atrial fibrillation: a randomised non-inferiority trial. Lancet 2009; 374: 534-42. Mechele Claude, Doshi SK, Abelardo Diesel D et al. on behalf of the PROTECT AF Investigators. Percutaneous Left Atrial Appendage Closure for Stroke Prophylaxis in Patients With Atrial Fibrillation 2.3-Year Follow-up of the PROTECT AF (Watchman Left Atrial Appendage System for Embolic Protection in Patients With Atrial Fibrillation) Trial. Circulation 2013; 127:720-729. - Alli O, Doshi S,  Kar S, Reddy VY, Sievert H et  al. Quality of Life Assessment in the Randomized PROTECT AF (Percutaneous Closure of the Left Atrial Appendage Versus Warfarin Therapy for Prevention of Stroke in Patients With Atrial Fibrillation) Trial of Patients at Risk for Stroke With Nonvalvular Atrial Fibrillation. J Am Coll Cardiol 2013; 57:0177-9. Vertell Limber DR, Tarri Abernethy, Price M, [place], Sievert H, Doshi S, Huber K, Reddy V. Prospective randomized evaluation of the Watchman left atrial appendage Device in patients with atrial fibrillation versus long-term warfarin therapy; the PREVAIL trial. Journal of the [employer] of Cardiology, Vol. 4, No. 1, 2014, 1-11. - Kar S, Doshi SK, Sadhu A, Horton R, Osorio J et al. Primary outcome evaluation of a next-generation left atrial appendage closure device: results from the PINNACLE FLX trial. Circulation 2021;143(18)1754-1762.    After today's visit with the patient which was dedicated solely for shared decision making visit regarding LAA closure device, the patient decided to proceed with the LAA appendage closure procedure scheduled to be done in the near future at [facility].   HAS-BLED score 4 Hypertension Yes  Abnormal renal and liver function (Dialysis, transplant, Cr >2.26 mg/dL /Cirrhosis or Bilirubin >2x Normal or AST/ALT/AP >3x Normal) No  Stroke Yes  Bleeding No  Labile INR (Unstable/high INR) No  Elderly (>65) Yes  Drugs or alcohol (? 8 drinks/week, anti-plt or NSAID) Yes   CHA2DS2-VASc Score = 6  The patient's score is based upon: CHF History: 1 HTN History: 0 Diabetes History: 0 Stroke History: 2 Vascular Disease History: 1 Age Score: 1 Gender Score: 1   #Hypertension Not controlled today.  I will increase  her Coreg to 12.5 mg by mouth twice daily.  She will continue to check her blood pressures at home 1-2 times per week and record the values.  #Obstructive sleep apnea Did not tolerate CPAP.  Has an appointment coming up soon for inspire  consideration.     Medication Adjustments/Labs and Tests Ordered: Current medicines are reviewed at length with the patient today.  Concerns regarding medicines are outlined above.  Orders Placed This Encounter  Procedures   ECHOCARDIOGRAM COMPLETE   Meds ordered this encounter  Medications   carvedilol (COREG) 12.5 MG tablet    Sig: Take 1 tablet (12.5 mg total) by mouth 2 (two) times daily with a meal.    Dispense:  180 tablet    Refill:  3    I,Mathew Stumpf,acting as a scribe for Vickie Epley, MD.,have documented all relevant documentation on the behalf of Vickie Epley, MD,as directed by  Vickie Epley, MD while in the presence of Vickie Epley, MD.  I, Kathyrn Drown, NP, have reviewed all documentation for this visit. The documentation on 07/29/21 for the exam, diagnosis, procedures, and orders are all accurate and complete.   Signed, Lars Mage, MD, [facility], [facility] 07/29/2021 3:49 PM    Electrophysiology [facility]

## 2021-07-09 NOTE — Progress Notes (Addendum)
Electrophysiology Office Follow up Visit Note:    Date:  08/11/2021   ID:  Michelle Johnston, DOB 03-10-1950, MRN [id number]  PCP:  Mayra Neer, MD  [facility] Cardiologist:  Donato Heinz, MD  [facility] Electrophysiologist:  Vickie Epley, MD    Interval History:    Michelle Johnston is a 71 y.o. female who presents for a follow up visit. They were last seen in clinic 11/21/2020.  Since their last appointment, she was seen by Dr. Gardiner Rhyme 07/01/2021 where she was doing well. She requested a referral to EP for a watchman evaluation. She has been unable to take her meloxicam to treat her arthritis while on Xarelto 20 mg daily. She was switched from metoprolol to carvedilol 6.25 mg twice daily for better blood pressure control. It was noted that she had been unable to tolerate her CPAP mask. She was referred to ENT to evaluate for Inspire.  Today, she is accompanied by a family member. She does not believe her antihypertensives are working.  Since her procedure she denies knowing of any recurrent   She endorses sleep apnea and has not been able to tolerate a full face mask on her CPAP due to sinus issues. She wakes up with significant headaches. She has an upcoming appointment with ENT to discuss the [facility] device.  She denies any palpitations, chest pain, shortness of breath, or peripheral edema. No lightheadedness, syncope, orthopnea, or PND.      Past Medical History:  Diagnosis Date   Anxiety    Arthritis    Bradycardia    Bronchitis    Depression    Dyspnea    Glaucoma    Hypertension    Presence of Watchman left atrial appendage closure device 08/07/2021   Watchman FLX 37m with Dr. LQuentin Ore  Seasonal allergies    Sleep apnea    Wears glasses     Past Surgical History:  Procedure Laterality Date   ABDOMINAL HYSTERECTOMY  1990   ATRIAL FIBRILLATION ABLATION N/A 08/12/2020   Procedure: ATRIAL FIBRILLATION ABLATION;  Surgeon: LVickie Epley MD;   [place] [facility];  Service: Cardiovascular;  Laterality: N/A;   BREAST BIOPSY Right    benign   CARDIOVERSION N/A 08/15/2019   Procedure: CARDIOVERSION;  Surgeon: PNigel Mormon MD;  [facility];  Service: Cardiovascular;  Laterality: N/A;   COLONOSCOPY     LEFT ATRIAL APPENDAGE OCCLUSION N/A 08/07/2021   Procedure: LEFT ATRIAL APPENDAGE OCCLUSION;  Surgeon: LVickie Epley MD;  [facility];  Service: Cardiovascular;  Laterality: N/A;   meniscus tear knee     left knee   NASAL SINUS SURGERY  1975   ORIF ANKLE FRACTURE  2009   left   TEE WITHOUT CARDIOVERSION N/A 08/07/2021   Procedure: TRANSESOPHAGEAL ECHOCARDIOGRAM (TEE);  Surgeon: LVickie Epley MD;  [facility];  Service: Cardiovascular;  Laterality: N/A;   TOTAL KNEE ARTHROPLASTY Left 02/03/2019   Procedure: LEFT TOTAL KNEE ARTHROPLASTY;  Surgeon: BMcarthur Rossetti MD;  [facility]: [facility];  Service: Orthopedics;  Laterality: Left;   TURBINATE REDUCTION Bilateral 06/04/2014   Procedure: BILATERAL TURBINATE REDUCTION;  Surgeon: SLeta Baptist MD;  [place] [facility]  Service: ENT;  Laterality: Bilateral;   TYMPANOSTOMY TUBE PLACEMENT     left    Current Medications: Current Meds  Medication Sig   acetaminophen (TYLENOL) 500 MG tablet Take 1,000 mg by mouth every 6 (six) hours as  needed. pain   albuterol (VENTOLIN HFA) 108 (90 Base) MCG/ACT inhaler Inhale 1-2 puffs into the lungs every 6 (six) hours as needed for wheezing or shortness of breath.   amLODipine (NORVASC) 5 MG tablet Take 5 mg by mouth at bedtime.    b complex vitamins tablet Take 1 tablet by mouth daily.   buPROPion (WELLBUTRIN XL) 300 MG 24 hr tablet Take 150 mg by mouth daily.   calcium carbonate (OS-CAL - DOSED IN MG OF ELEMENTAL CALCIUM) 1250 (500 Ca) MG tablet Take 1 tablet by mouth daily with breakfast.   carvedilol (COREG) 12.5 MG tablet Take 1 tablet (12.5 mg total) by mouth 2  (two) times daily with a meal.   cetirizine (ZYRTEC) 10 MG tablet Take 10 mg by mouth at bedtime.   Cholecalciferol (VITAMIN D) 50 MCG (2000 UT) tablet Take 2,000 Units by mouth daily.   FLUoxetine (PROZAC) 40 MG capsule Take 40 mg by mouth daily.   Fluticasone-Salmeterol (ADVAIR) 250-50 MCG/DOSE AEPB Inhale 1 puff into the lungs 2 (two) times daily as needed (asthma).   gabapentin (NEURONTIN) 300 MG capsule Take 300 mg by mouth 3 (three) times daily.   guaiFENesin (MUCINEX) 600 MG 12 hr tablet Take 600 mg by mouth daily as needed for cough or to loosen phlegm.   ipratropium (ATROVENT) 0.06 % nasal spray Place 1 spray into both nostrils daily as needed for rhinitis.   latanoprost (XALATAN) 0.005 % ophthalmic solution Place 1 drop into both eyes every morning.    meloxicam (MOBIC) 15 MG tablet Take 7.5 mg by mouth daily as needed for pain.   montelukast (SINGULAIR) 10 MG tablet Take 10 mg by mouth at bedtime.   Multiple Vitamins-Minerals (MULTIVITAMIN WITH MINERALS) tablet Take 1 tablet by mouth daily.   nitroGLYCERIN (NITROSTAT) 0.4 MG SL tablet Place 0.4 mg under the tongue every 5 (five) minutes as needed for chest pain.   Omega-3 Fatty Acids (FISH OIL) 1000 MG CAPS Take 1,000 mg by mouth daily.   omeprazole (PRILOSEC) 20 MG capsule Take 20 mg by mouth daily as needed (acid reflux).   rivaroxaban (XARELTO) 20 MG TABS tablet Take 1 tablet (20 mg total) by mouth daily with supper.   rosuvastatin (CRESTOR) 20 MG tablet Take 1 tablet (20 mg total) by mouth daily.   vitamin C (ASCORBIC ACID) 500 MG tablet Take 500 mg by mouth daily.   [DISCONTINUED] carvedilol (COREG) 6.25 MG tablet Take 1 tablet (6.25 mg total) by mouth 2 (two) times daily.   [DISCONTINUED] Turmeric 500 MG CAPS Take 500 mg by mouth daily. (Patient not taking: Reported on 08/01/2021)     Allergies:   Dexlansoprazole, Flecainide, Other, Cefaclor, Ciprofibrate, Ciprofloxacin, and Metronidazole   Social History   Socioeconomic  History   Marital status: Married    Spouse name: Not on file   Number of children: 0   Years of education: Not on file   Highest education level: Not on file  Occupational History   Not on file  Tobacco Use   Smoking status: Former    Packs/day: 2.00    Years: 30.00    Total pack years: 60.00    Types: Cigarettes    Quit date: 05/28/1998    Years since quitting: 23.2   Smokeless tobacco: Never  Vaping Use   Vaping Use: Never used  Substance and Sexual Activity   Alcohol use: Yes    Alcohol/week: 3.0 - 4.0 standard drinks of alcohol    Types: 2 Glasses  of wine, 1 - 2 Cans of beer per week    Comment: daily-wine daily-1 glass   Drug use: No   Sexual activity: Not on file  Other Topics Concern   Not on file  Social History Narrative   Not on file   Social Determinants of Health   Financial Resource Strain: Not on file  Food Insecurity: Not on file  Transportation Needs: Not on file  Physical Activity: Not on file  Stress: Not on file  Social Connections: Not on file     Family History: The patient's family history includes Hyperlipidemia in her mother; Hypertension in her mother; Lung cancer in her father; Lymphoma in her mother.  ROS:   Please see the history of present illness.    (+) Headaches All other systems reviewed and are negative.  EKGs/Labs/Other Studies Reviewed:    The following studies were reviewed today:  08/12/2020  Atrial Fibrillation Ablation CONCLUSIONS: 1. Successful PVI 2. Successful ablation/isolation of the posterior wall 3.  Three-dimensional electroanatomic mapping revealed significant scarring of the left atrium surrounding the pulmonary veins and patchy scarring of the posterior wall 4. Intracardiac echo reveals left common os, trivial pericardial effusion 5. No early apparent complications. 6.  If recurrence of atrial fibrillation, consider antiarrhythmic therapy (likely dofetilide)  08/05/2020  Cardiac CTA IMPRESSION: 1. There is  normal pulmonary vein drainage into the left atrium with ostial measurements above.   2. There is no thrombus in the left atrial appendage.   3. The esophagus runs in the left atrial midline and is in close proximity to the left upper and left lower pulmonary vein ostia.   4. No PFO/ASD.   5. Normal coronary origin. Right dominance.   6. CAC score of 0 which is 0 percentile for age-, race-, and sex-matched controls.  06/24/2020   Echo  1. Left ventricular ejection fraction, by estimation, is 55 to 60%. The  left ventricle has normal function. The left ventricle has no regional  wall motion abnormalities. There is mild left ventricular hypertrophy.  Left ventricular diastolic parameters  are indeterminate.   2. Right ventricular systolic function is normal. The right ventricular  size is normal. There is normal pulmonary artery systolic pressure. The  estimated right ventricular systolic pressure is 59.5 mmHg.   3. Left atrial size was moderately dilated.   4. Right atrial size was severely dilated.   5. The mitral valve is normal in structure. Mild mitral valve  regurgitation.   6. The aortic valve is tricuspid. Aortic valve regurgitation is mild. No  aortic stenosis is present.   7. Aortic dilatation noted. There is mild dilatation of the ascending  aorta, measuring 39 mm.   8. The inferior vena cava is normal in size with greater than 50%  respiratory variability, suggesting right atrial pressure of 3 mmHg.     Recent Labs: 07/14/2021: BUN 17; Creatinine, Ser 0.70; Hemoglobin 12.5; Platelets 306; Potassium 4.7; Sodium 131   Recent Lipid Panel    Component Value Date/Time   CHOL 214 (H) 09/23/2020 1213   TRIG 109 09/23/2020 1213   HDL 104 09/23/2020 1213   CHOLHDL 2.1 09/23/2020 1213   LDLCALC 92 09/23/2020 1213    Physical Exam:    VS:  BP (!) 160/74   Pulse 62   Ht '5\' 5"'$  (1.651 m)   Wt 155 lb 6.4 oz (70.5 kg)   SpO2 96%   BMI 25.86 kg/m     Wt Readings  from Last  3 Encounters:  08/07/21 155 lb (70.3 kg)  07/10/21 155 lb 6.4 oz (70.5 kg)  07/01/21 153 lb 12.8 oz (69.8 kg)     GEN: Well nourished, well developed in no acute distress HEENT: Normal NECK: No JVD; No carotid bruits LYMPHATICS: No lymphadenopathy CARDIAC: RRR, no murmurs, rubs, gallops RESPIRATORY:  Clear to auscultation without rales, wheezing or rhonchi  ABDOMEN: Soft, non-tender, non-distended MUSCULOSKELETAL:  No edema; No deformity  SKIN: Warm and dry NEUROLOGIC:  Alert and oriented x 3 PSYCHIATRIC:  Normal affect        ASSESSMENT:    1. Persistent atrial fibrillation ([place])   2. Obstructive sleep apnea (adult) (pediatric)   3. TIA (transient ischemic attack)    PLAN:    In order of problems listed above:  #Persistent atrial fibrillation #TIA hx  I have seen Michelle Johnston in the office today who is being considered for a Watchman left atrial appendage closure device. I believe they will benefit from this procedure given their history of atrial fibrillation, CHA2DS2-VASc score of 3 and unadjusted ischemic stroke rate of 3.2% per year. Unfortunately, the patient is not felt to be a long term anticoagulation candidate secondary to chronic use of NSAIDs secondary to arthritis. The patient's chart has been reviewed and I feel that they would be a candidate for short term oral anticoagulation after Watchman implant.   It is my belief that after undergoing a LAA closure procedure, Michelle Johnston will not need long term anticoagulation which eliminates anticoagulation side effects and major bleeding risk.   Procedural risks for the Watchman implant have been reviewed with the patient including a 0.5% risk of stroke, <1% risk of perforation and <1% risk of device embolization.    The published clinical data on the safety and effectiveness of WATCHMAN include but are not limited to the following: - Holmes DR, Mechele Claude, Sick P et al. for the PROTECT AF Investigators.  Percutaneous closure of the left atrial appendage versus warfarin therapy for prevention of stroke in patients with atrial fibrillation: a randomised non-inferiority trial. Lancet 2009; 374: 534-42. Mechele Claude, Doshi SK, Abelardo Diesel D et al. on behalf of the PROTECT AF Investigators. Percutaneous Left Atrial Appendage Closure for Stroke Prophylaxis in Patients With Atrial Fibrillation 2.3-Year Follow-up of the PROTECT AF (Watchman Left Atrial Appendage System for Embolic Protection in Patients With Atrial Fibrillation) Trial. Circulation 2013; 127:720-729. - Alli O, Doshi S,  Kar S, Reddy VY, Sievert H et al. Quality of Life Assessment in the Randomized PROTECT AF (Percutaneous Closure of the Left Atrial Appendage Versus Warfarin Therapy for Prevention of Stroke in Patients With Atrial Fibrillation) Trial of Patients at Risk for Stroke With Nonvalvular Atrial Fibrillation. J Am Coll Cardiol 2013; 42:7062-3. Vertell Limber DR, Tarri Abernethy, Price M, [place], Sievert H, Doshi S, Huber K, Reddy V. Prospective randomized evaluation of the Watchman left atrial appendage Device in patients with atrial fibrillation versus long-term warfarin therapy; the PREVAIL trial. Journal of the [employer] of Cardiology, Vol. 4, No. 1, 2014, 1-11. - Kar S, Doshi SK, Sadhu A, Horton R, Osorio J et al. Primary outcome evaluation of a next-generation left atrial appendage closure device: results from the PINNACLE FLX trial. Circulation 2021;143(18)1754-1762.    After today's visit with the patient which was dedicated solely for shared decision making visit regarding LAA closure device, the patient decided to proceed with the LAA appendage closure procedure scheduled to be done in the near future at  [facility] hospital.   HAS-BLED score 4 Hypertension Yes  Abnormal renal and liver function (Dialysis, transplant, Cr >2.26 mg/dL /Cirrhosis or Bilirubin >2x Normal or AST/ALT/AP >3x Normal) No  Stroke Yes  Bleeding No  Labile  INR (Unstable/high INR) No  Elderly (>65) Yes  Drugs or alcohol (? 8 drinks/week, anti-plt or NSAID) Yes   CHA2DS2-VASc Score = 6  The patient's score is based upon: CHF History: 0 HTN History: 1 Diabetes History: 0 Stroke History: 2 Vascular Disease History: 1 Age Score: 1 Gender Score: 1   #Hypertension Not controlled today.  I will increase her Coreg to 12.5 mg by mouth twice daily.  She will continue to check her blood pressures at home 1-2 times per week and record the values.  #Obstructive sleep apnea Did not tolerate CPAP.  Has an appointment coming up soon for inspire consideration.     Medication Adjustments/Labs and Tests Ordered: Current medicines are reviewed at length with the patient today.  Concerns regarding medicines are outlined above.  Orders Placed This Encounter  Procedures   ECHOCARDIOGRAM COMPLETE   Meds ordered this encounter  Medications   carvedilol (COREG) 12.5 MG tablet    Sig: Take 1 tablet (12.5 mg total) by mouth 2 (two) times daily with a meal.    Dispense:  180 tablet    Refill:  3    I,Mathew Stumpf,acting as a scribe for Vickie Epley, MD.,have documented all relevant documentation on the behalf of Vickie Epley, MD,as directed by  Vickie Epley, MD while in the presence of Vickie Epley, MD.  I, Angelena Form, PA-C, have reviewed all documentation for this visit. The documentation on 08/11/21 for the exam, diagnosis, procedures, and orders are all accurate and complete.   Signed, Lars Mage, MD, [facility], [facility] 08/11/2021 12:40 PM    Electrophysiology [facility]

## 2021-07-10 ENCOUNTER — Ambulatory Visit: Payer: Medicare HMO | Admitting: Cardiology

## 2021-07-10 VITALS — BP 160/74 | HR 62 | Ht 65.0 in | Wt 155.4 lb

## 2021-07-10 DIAGNOSIS — G4733 Obstructive sleep apnea (adult) (pediatric): Secondary | ICD-10-CM

## 2021-07-10 DIAGNOSIS — I4819 Other persistent atrial fibrillation: Secondary | ICD-10-CM | POA: Diagnosis not present

## 2021-07-10 DIAGNOSIS — G459 Transient cerebral ischemic attack, unspecified: Secondary | ICD-10-CM

## 2021-07-10 MED ORDER — CARVEDILOL 12.5 MG PO TABS: 12.5000 mg | ORAL_TABLET | Freq: Two times a day (BID) | ORAL | 3 refills | Status: DC

## 2021-07-10 NOTE — Patient Instructions (Addendum)
Medication Instructions:  ?Increase Carvedilol to 12.5 mg two times a day ?Your physician recommends that you continue on your current medications as directed. Please refer to the Current Medication list given to you today. ?*If you need a refill on your cardiac medications before your next appointment, please call your pharmacy* ? ?Lab Work: ?None. ?If you have labs (blood work) drawn today and your tests are completely normal, you will receive your results only by: ?MyChart Message (if you have MyChart) OR ?A paper copy in the mail ?If you have any lab test that is abnormal or we need to change your treatment, we will call you to review the results. ? ?Testing/Procedures: ?Your physician has requested that you have an echocardiogram. Echocardiography is a painless test that uses sound waves to create images of your heart. It provides your doctor with information about the size and shape of your heart and how well your heart?s chambers and valves are working. This procedure takes approximately one hour. There are no restrictions for this procedure.  ? ?Follow-Up: ?At [facility], you and your health needs are our priority.  As part of our continuing mission to provide you with exceptional heart care, we have created designated Provider Care Teams.  These Care Teams include your primary Cardiologist (physician) and Advanced Practice Providers (APPs -  Physician Assistants and Nurse Practitioners) who all work together to provide you with the care you need, when you need it. ? ?Your physician wants you to follow-up in: Lenice Llamas, the [facility] Nurse Navigator, will call you after your CT once the [facility] Team has reviewed your imaging for an update on proceedings. [place]'s direct number is [phone] if you need assistance.  ? ?We recommend signing up for the patient portal called "MyChart".  Sign up information is provided on this After Visit Summary.  MyChart is used to connect with patients for Virtual Visits  (Telemedicine).  Patients are able to view lab/test results, encounter notes, upcoming appointments, etc.  Non-urgent messages can be sent to your provider as well.   ?To learn more about what you can do with MyChart, go to [email or web address].   ? ?Any Other Special Instructions Will Be Listed Below (If Applicable). ? ?Left Atrial Appendage Closure Device Implantation ? ?Left atrial appendage (LAA) closure device implantation is a procedure to put a small device in the LAA of the heart. The LAA is a small sac in the wall of the heart's left upper chamber. Blood clots can form in the LAA in people with atrial fibrillation (AFib). The device closes the LAA to help prevent a blood clot and stroke. ?AFib is a type of irregular or rapid heartbeat (arrhythmia). There is an increased risk of blood clots and stroke with AFib. This procedure helps to reduce that risk. ?Tell a health care provider about: ?Any allergies you have. ?All medicines you are taking, including vitamins, herbs, eye drops, creams, and over-the-counter medicines. ?Any problems you or family members have had with anesthetic medicines. ?Any blood disorders you have. ?Any surgeries you have had. ?Any medical conditions you have. ?Whether you are pregnant or may be pregnant. ?What are the risks? ?Generally, this is a safe procedure. However, problems may occur, including: ?Infection. ?Bleeding. ?Allergic reactions to medicines or dyes. ?Damage to nearby structures or organs. ?Heart attack. ?Stroke. ?Blood clots. ?Changes in heart rhythm. ?Device failure. ?What happens before the procedure? ?Staying hydrated ?Follow instructions from your health care provider about hydration, which may include: ?Up to 2 hours before  the procedure - you may continue to drink clear liquids, such as water, clear fruit juice, black coffee, and plain tea. ?Eating and drinking restrictions ?Follow instructions from your health care provider about eating and drinking, which  may include: ?8 hours before the procedure - stop eating heavy meals or foods, such as meat, fried foods, or fatty foods. ?6 hours before the procedure - stop eating light meals or foods, such as toast or cereal. ?6 hours before the procedure - stop drinking milk or drinks that contain milk. ?2 hours before the procedure - stop drinking clear liquids. ?Medicines ?Ask your health care provider about: ?Changing or stopping your regular medicines. This is especially important if you are taking diabetes medicines or blood thinners. ?Taking medicines such as aspirin and ibuprofen. These medicines can thin your blood. Do not take these medicines unless your health care provider tells you to take them. ?Taking over-the-counter medicines, vitamins, herbs, and supplements. ?Tests ?You may have blood tests and a physical exam. ?You may have an electrocardiogram (ECG). This test checks your heart's electrical patterns and rhythms. ?General instructions ?Do not use any products that contain nicotine or tobacco. These include cigarettes, chewing tobacco, and vaping devices, such as e-cigarettes. If you need help quitting, ask your health care provider. ?Ask your health care provider what steps will be taken to help prevent infection. These steps may include: ?Removing hair at the surgery site. ?Washing skin with a germ-killing soap. ?Taking antibiotic medicine. ?Plan to have a responsible adult take you home from the hospital or clinic. ?Plan to have a responsible adult care for you for the time you are told after you leave the hospital or clinic. This is important. ?What happens during the procedure? ?An IV will be inserted into one of your veins. ?You will be given one or more of the following: ?A medicine to help you relax (sedative). ?A medicine to make you fall asleep (general anesthetic). ?A small incision will be made in your groin area. ?A small wire will be put through the incision and into a blood vessel. ?Dye may be  injected so X-rays can be used to guide the wire through the blood vessel. ?A long, thin tube (catheter) will be put over the small wire and moved up through the blood vessel to reach your heart. ?The closure device will be moved through the catheter until it reaches your heart. ?A small hole will be made in the septum (transseptal puncture). The septum is a thin tissue that separates the upper two chambers of the heart. ?The device will be placed so that it closes the LAA. X-rays will be done to make sure the device is in the right place. ?The catheter and wire will be removed. The closure device will remain in your heart. ?After pressure is applied over the catheter site to prevent bleeding, a bandage (dressing) will be placed over the site where the catheter was inserted. ?The procedure may vary among health care providers and hospitals. ?What happens after the procedure? ?Your blood pressure, heart rate, breathing rate, and blood oxygen level will be monitored until you leave the hospital or clinic. ?You may have to wear compression stockings. These stockings help to prevent blood clots and reduce swelling in your legs. ?If you were given a sedative during the procedure, it can affect you for several hours. Do not drive or operate machinery until your health care provider says it is safe. ?You may be given pain medicine. ?You may need to  drink more fluids to wash (flush) the dye out of your body. Drink enough fluid to keep your urine pale yellow. ?Take over-the-counter and prescription medicines only as told by your health care provider. This is especially important if you were given blood thinners. ?Summary ?Left atrial appendage (LAA) closure device implantation is a procedure that is done to put a small device in the LAA of the heart. The LAA is a small sac in the wall of the heart's left upper chamber. ?The device closes the LAA to prevent stroke and other problems. ?Follow instructions from your health care  provider before and after the procedure. ?This information is not intended to replace advice given to you by your health care provider. Make sure you discuss any questions you have with your health care

## 2021-07-11 ENCOUNTER — Telehealth: Payer: Self-pay

## 2021-07-11 ENCOUNTER — Other Ambulatory Visit: Payer: Self-pay

## 2021-07-11 DIAGNOSIS — I4819 Other persistent atrial fibrillation: Secondary | ICD-10-CM

## 2021-07-11 NOTE — Telephone Encounter (Signed)
The patient had recent [place] consult with Dr. Quentin Ore and wishes to proceed with implant at earliest available time.  ?Rescheduled her echo to 5/15 and she will have pre-procedure blood work drawn that day as well. ?She will be scheduled for Watchman implant on 07/24/2021. ?She understands she will be contacted after her echo to finalize plans and to get instructions for Watchman procedure. ?She was grateful for call and agrees with plan.  ?

## 2021-07-14 ENCOUNTER — Other Ambulatory Visit: Payer: Medicare HMO | Admitting: *Deleted

## 2021-07-14 ENCOUNTER — Ambulatory Visit (HOSPITAL_COMMUNITY): Payer: Medicare HMO | Attending: Cardiology

## 2021-07-14 DIAGNOSIS — G4733 Obstructive sleep apnea (adult) (pediatric): Secondary | ICD-10-CM | POA: Diagnosis not present

## 2021-07-14 DIAGNOSIS — I4819 Other persistent atrial fibrillation: Secondary | ICD-10-CM

## 2021-07-14 DIAGNOSIS — G459 Transient cerebral ischemic attack, unspecified: Secondary | ICD-10-CM | POA: Insufficient documentation

## 2021-07-14 LAB — BASIC METABOLIC PANEL
BUN/Creatinine Ratio: 24 (ref 12–28)
BUN: 17 mg/dL (ref 8–27)
CO2: 27 mmol/L (ref 20–29)
Calcium: 10.2 mg/dL (ref 8.7–10.3)
Chloride: 95 mmol/L — ABNORMAL LOW (ref 96–106)
Creatinine, Ser: 0.7 mg/dL (ref 0.57–1.00)
Glucose: 109 mg/dL — ABNORMAL HIGH (ref 70–99)
Potassium: 4.7 mmol/L (ref 3.5–5.2)
Sodium: 131 mmol/L — ABNORMAL LOW (ref 134–144)
eGFR: 91 mL/min/{1.73_m2} (ref >59)

## 2021-07-14 LAB — ECHOCARDIOGRAM COMPLETE
Area-P 1/2: 2.95 cm2
P 1/2 time: 493 msec
S' Lateral: 3.4 cm

## 2021-07-14 LAB — CBC WITH DIFFERENTIAL/PLATELET
Basophils Absolute: 0 10*3/uL (ref 0.0–0.2)
Basos: 0 %
EOS (ABSOLUTE): 0 10*3/uL (ref 0.0–0.4)
Eos: 1 %
Hematocrit: 35.9 % (ref 34.0–46.6)
Hemoglobin: 12.5 g/dL (ref 11.1–15.9)
Lymphocytes Absolute: 0.9 10*3/uL (ref 0.7–3.1)
Lymphs: 16 %
MCH: 34.5 pg — ABNORMAL HIGH (ref 26.6–33.0)
MCHC: 34.8 g/dL (ref 31.5–35.7)
MCV: 99 fL — ABNORMAL HIGH (ref 79–97)
Monocytes Absolute: 0.4 10*3/uL (ref 0.1–0.9)
Monocytes: 6 %
Neutrophils Absolute: 4.6 10*3/uL (ref 1.4–7.0)
Neutrophils: 77 %
Platelets: 306 10*3/uL (ref 150–450)
RBC: 3.62 x10E6/uL — ABNORMAL LOW (ref 3.77–5.28)
RDW: 12.5 % (ref 11.7–15.4)
WBC: 5.9 10*3/uL (ref 3.4–10.8)

## 2021-07-15 ENCOUNTER — Telehealth: Payer: Self-pay

## 2021-07-15 DIAGNOSIS — I351 Nonrheumatic aortic (valve) insufficiency: Secondary | ICD-10-CM

## 2021-07-15 NOTE — Telephone Encounter (Signed)
Discussed with Dr. Quentin Ore and reviewed results with patient in detail. ?Per Dr. Quentin Ore, cMRI ordered to better assess AI severity.  ?The patient understands if her cMRI cannot be scheduled before 5/25, her LAAO procedure will be postponed. She also understands it could be postponed regardless if AI is severe. ?She was grateful for call and agrees with plan.  ?

## 2021-07-16 ENCOUNTER — Telehealth (HOSPITAL_COMMUNITY): Payer: Self-pay | Admitting: *Deleted

## 2021-07-16 DIAGNOSIS — M5416 Radiculopathy, lumbar region: Secondary | ICD-10-CM | POA: Diagnosis not present

## 2021-07-16 NOTE — Telephone Encounter (Signed)
Reaching out to patient to offer assistance regarding upcoming cardiac imaging study; pt verbalizes understanding of appt date/time, parking situation and where to check in; name and call back number provided for further questions should they arise ? ?Gordy Clement RN Navigator Cardiac Imaging ?[facility] ?[phone] office ?[phone] cell ? ?Patient denies metal, claustrophobia or difficult IV. ?

## 2021-07-17 ENCOUNTER — Ambulatory Visit (HOSPITAL_COMMUNITY)
Admission: RE | Admit: 2021-07-17 | Discharge: 2021-07-17 | Disposition: A | Discharge status: Home / Self Care | Payer: Medicare HMO | Source: Ambulatory Visit | Attending: Cardiology | Admitting: Cardiology

## 2021-07-17 DIAGNOSIS — I351 Nonrheumatic aortic (valve) insufficiency: Secondary | ICD-10-CM | POA: Diagnosis not present

## 2021-07-17 MED ORDER — GADOBUTROL 1 MMOL/ML IV SOLN
10.0000 mL | Freq: Once | INTRAVENOUS | Status: AC | PRN
  Administered 2021-07-17: 10 mL via INTRAVENOUS

## 2021-07-23 ENCOUNTER — Telehealth: Payer: Self-pay

## 2021-07-23 DIAGNOSIS — H6982 Other specified disorders of Eustachian tube, left ear: Secondary | ICD-10-CM | POA: Diagnosis not present

## 2021-07-23 DIAGNOSIS — H7202 Central perforation of tympanic membrane, left ear: Secondary | ICD-10-CM | POA: Diagnosis not present

## 2021-07-23 DIAGNOSIS — J343 Hypertrophy of nasal turbinates: Secondary | ICD-10-CM | POA: Diagnosis not present

## 2021-07-23 DIAGNOSIS — J0101 Acute recurrent maxillary sinusitis: Secondary | ICD-10-CM | POA: Diagnosis not present

## 2021-07-23 DIAGNOSIS — H903 Sensorineural hearing loss, bilateral: Secondary | ICD-10-CM | POA: Diagnosis not present

## 2021-07-23 NOTE — Telephone Encounter (Signed)
Dr. Oralia Rud review of 08/05/2020 cCT:  "No Thrombus. Maximum diameter 23 mm. Chicken wing Diameter. Overall range supports a 27 mm device, 24 mm could be considered. No issues with transeptal puncture: slightly more inferior stick to get coaxial."

## 2021-07-24 ENCOUNTER — Ambulatory Visit: Payer: Medicare HMO | Admitting: Physician Assistant

## 2021-07-24 DIAGNOSIS — I4891 Unspecified atrial fibrillation: Secondary | ICD-10-CM

## 2021-07-29 ENCOUNTER — Other Ambulatory Visit (HOSPITAL_COMMUNITY): Payer: Medicare HMO

## 2021-07-29 NOTE — Progress Notes (Deleted)
Error in charting.

## 2021-07-31 ENCOUNTER — Other Ambulatory Visit (HOSPITAL_COMMUNITY): Payer: Medicare HMO

## 2021-08-05 ENCOUNTER — Telehealth: Payer: Self-pay

## 2021-08-05 ENCOUNTER — Other Ambulatory Visit: Payer: Self-pay

## 2021-08-05 DIAGNOSIS — I4819 Other persistent atrial fibrillation: Secondary | ICD-10-CM

## 2021-08-05 NOTE — Telephone Encounter (Signed)
Confirmed with patient she will arrive at [place] on 6/8 at 0700 for 0930 procedure. She understands to have a driver and someone to stay with her for 24 hours after discharge. Medication instructions reviewed.  She will call if she has questions prior to procedure.

## 2021-08-07 ENCOUNTER — Encounter (HOSPITAL_COMMUNITY): Payer: Self-pay | Admitting: Cardiology

## 2021-08-07 ENCOUNTER — Other Ambulatory Visit: Payer: Self-pay

## 2021-08-07 ENCOUNTER — Encounter (HOSPITAL_COMMUNITY)
Admission: RE | Disposition: A | Discharge status: Home / Self Care | Payer: Medicare HMO | Source: Home / Self Care | Attending: Cardiology

## 2021-08-07 ENCOUNTER — Inpatient Hospital Stay (HOSPITAL_COMMUNITY)
Admission: RE | Admit: 2021-08-07 | Discharge: 2021-08-07 | Disposition: A | Discharge status: Home / Self Care | Payer: Medicare HMO | Source: Ambulatory Visit | Attending: Cardiology | Admitting: Cardiology

## 2021-08-07 ENCOUNTER — Inpatient Hospital Stay (HOSPITAL_COMMUNITY)
Admission: RE | Admit: 2021-08-07 | Discharge: 2021-08-07 | DRG: 274 | Disposition: A | Discharge status: Home / Self Care | Payer: Medicare HMO | Attending: Cardiology | Admitting: Cardiology

## 2021-08-07 ENCOUNTER — Inpatient Hospital Stay (HOSPITAL_COMMUNITY): Payer: Medicare HMO | Admitting: Anesthesiology

## 2021-08-07 ENCOUNTER — Inpatient Hospital Stay (HOSPITAL_COMMUNITY): Payer: Medicare HMO

## 2021-08-07 DIAGNOSIS — D6869 Other thrombophilia: Secondary | ICD-10-CM | POA: Diagnosis present

## 2021-08-07 DIAGNOSIS — I739 Peripheral vascular disease, unspecified: Secondary | ICD-10-CM | POA: Diagnosis present

## 2021-08-07 DIAGNOSIS — G4733 Obstructive sleep apnea (adult) (pediatric): Secondary | ICD-10-CM | POA: Diagnosis not present

## 2021-08-07 DIAGNOSIS — Z87891 Personal history of nicotine dependence: Secondary | ICD-10-CM | POA: Diagnosis not present

## 2021-08-07 DIAGNOSIS — Z96652 Presence of left artificial knee joint: Secondary | ICD-10-CM | POA: Diagnosis not present

## 2021-08-07 DIAGNOSIS — I4891 Unspecified atrial fibrillation: Secondary | ICD-10-CM | POA: Diagnosis present

## 2021-08-07 DIAGNOSIS — Z006 Encounter for examination for normal comparison and control in clinical research program: Secondary | ICD-10-CM | POA: Diagnosis not present

## 2021-08-07 DIAGNOSIS — I10 Essential (primary) hypertension: Secondary | ICD-10-CM

## 2021-08-07 DIAGNOSIS — Z8249 Family history of ischemic heart disease and other diseases of the circulatory system: Secondary | ICD-10-CM

## 2021-08-07 DIAGNOSIS — R001 Bradycardia, unspecified: Secondary | ICD-10-CM | POA: Diagnosis not present

## 2021-08-07 DIAGNOSIS — Z8673 Personal history of transient ischemic attack (TIA), and cerebral infarction without residual deficits: Secondary | ICD-10-CM | POA: Diagnosis not present

## 2021-08-07 DIAGNOSIS — Z79899 Other long term (current) drug therapy: Secondary | ICD-10-CM | POA: Diagnosis not present

## 2021-08-07 DIAGNOSIS — Z807 Family history of other malignant neoplasms of lymphoid, hematopoietic and related tissues: Secondary | ICD-10-CM

## 2021-08-07 DIAGNOSIS — Z7901 Long term (current) use of anticoagulants: Secondary | ICD-10-CM | POA: Diagnosis not present

## 2021-08-07 DIAGNOSIS — F32A Depression, unspecified: Secondary | ICD-10-CM | POA: Diagnosis present

## 2021-08-07 DIAGNOSIS — Z888 Allergy status to other drugs, medicaments and biological substances status: Secondary | ICD-10-CM | POA: Diagnosis not present

## 2021-08-07 DIAGNOSIS — Z95818 Presence of other cardiac implants and grafts: Principal | ICD-10-CM

## 2021-08-07 DIAGNOSIS — Z791 Long term (current) use of non-steroidal anti-inflammatories (NSAID): Secondary | ICD-10-CM

## 2021-08-07 DIAGNOSIS — M1712 Unilateral primary osteoarthritis, left knee: Secondary | ICD-10-CM | POA: Diagnosis present

## 2021-08-07 DIAGNOSIS — I4819 Other persistent atrial fibrillation: Principal | ICD-10-CM | POA: Diagnosis present

## 2021-08-07 DIAGNOSIS — I351 Nonrheumatic aortic (valve) insufficiency: Secondary | ICD-10-CM | POA: Diagnosis present

## 2021-08-07 DIAGNOSIS — G4730 Sleep apnea, unspecified: Secondary | ICD-10-CM

## 2021-08-07 DIAGNOSIS — Z881 Allergy status to other antibiotic agents status: Secondary | ICD-10-CM | POA: Diagnosis not present

## 2021-08-07 DIAGNOSIS — M47814 Spondylosis without myelopathy or radiculopathy, thoracic region: Secondary | ICD-10-CM | POA: Diagnosis not present

## 2021-08-07 DIAGNOSIS — J302 Other seasonal allergic rhinitis: Secondary | ICD-10-CM | POA: Diagnosis present

## 2021-08-07 DIAGNOSIS — I480 Paroxysmal atrial fibrillation: Secondary | ICD-10-CM | POA: Diagnosis present

## 2021-08-07 DIAGNOSIS — I350 Nonrheumatic aortic (valve) stenosis: Secondary | ICD-10-CM | POA: Diagnosis not present

## 2021-08-07 DIAGNOSIS — Z801 Family history of malignant neoplasm of trachea, bronchus and lung: Secondary | ICD-10-CM | POA: Diagnosis not present

## 2021-08-07 HISTORY — PX: LEFT ATRIAL APPENDAGE OCCLUSION: EP1229

## 2021-08-07 HISTORY — DX: Presence of other cardiac implants and grafts: Z95.818

## 2021-08-07 HISTORY — DX: Sleep apnea, unspecified: G47.30

## 2021-08-07 HISTORY — PX: TEE WITHOUT CARDIOVERSION: SHX5443

## 2021-08-07 HISTORY — DX: Anxiety disorder, unspecified: F41.9

## 2021-08-07 HISTORY — DX: Dyspnea, unspecified: R06.00

## 2021-08-07 LAB — ABO/RH: ABO/RH(D): A POS

## 2021-08-07 LAB — SURGICAL PCR SCREEN
MRSA, PCR: NEGATIVE
Staphylococcus aureus: NEGATIVE

## 2021-08-07 LAB — POCT ACTIVATED CLOTTING TIME: Activated Clotting Time: 348 seconds

## 2021-08-07 LAB — ECHO TEE
AV Mean grad: 3 mmHg
AV Peak grad: 7.3 mmHg
Ao pk vel: 1.35 m/s
P 1/2 time: 432 msec

## 2021-08-07 LAB — TYPE AND SCREEN
ABO/RH(D): A POS
Antibody Screen: NEGATIVE

## 2021-08-07 SURGERY — LEFT ATRIAL APPENDAGE OCCLUSION
Anesthesia: General

## 2021-08-07 MED ORDER — SODIUM CHLORIDE 0.9 % IV SOLN: 250.0000 mL | INTRAVENOUS | Status: DC | PRN

## 2021-08-07 MED ORDER — ROSUVASTATIN CALCIUM 20 MG PO TABS
20.0000 mg | ORAL_TABLET | Freq: Every day | ORAL | Status: DC
  Administered 2021-08-07: 20 mg via ORAL
  Filled 2021-08-07: qty 1

## 2021-08-07 MED ORDER — HEPARIN SODIUM (PORCINE) 1000 UNIT/ML IJ SOLN
INTRAMUSCULAR | Status: DC | PRN
  Administered 2021-08-07: 11000 [IU] via INTRAVENOUS

## 2021-08-07 MED ORDER — GABAPENTIN 300 MG PO CAPS
300.0000 mg | ORAL_CAPSULE | Freq: Three times a day (TID) | ORAL | Status: DC
  Administered 2021-08-07: 300 mg via ORAL
  Filled 2021-08-07: qty 1

## 2021-08-07 MED ORDER — CARVEDILOL 12.5 MG PO TABS
12.5000 mg | ORAL_TABLET | Freq: Two times a day (BID) | ORAL | Status: DC
  Administered 2021-08-07: 12.5 mg via ORAL
  Filled 2021-08-07: qty 1

## 2021-08-07 MED ORDER — PROPOFOL 10 MG/ML IV BOLUS
INTRAVENOUS | Status: DC | PRN
  Administered 2021-08-07 (×2): 50 mg via INTRAVENOUS
  Administered 2021-08-07: 100 mg via INTRAVENOUS

## 2021-08-07 MED ORDER — HEPARIN (PORCINE) IN NACL 2000-0.9 UNIT/L-% IV SOLN
INTRAVENOUS | Status: DC | PRN
  Administered 2021-08-07: 1000 mL

## 2021-08-07 MED ORDER — FENTANYL CITRATE (PF) 250 MCG/5ML IJ SOLN
INTRAMUSCULAR | Status: DC | PRN
  Administered 2021-08-07 (×2): 50 ug via INTRAVENOUS

## 2021-08-07 MED ORDER — ROCURONIUM BROMIDE 10 MG/ML (PF) SYRINGE
PREFILLED_SYRINGE | INTRAVENOUS | Status: DC | PRN
  Administered 2021-08-07: 40 mg via INTRAVENOUS

## 2021-08-07 MED ORDER — HEPARIN (PORCINE) IN NACL 1000-0.9 UT/500ML-% IV SOLN
INTRAVENOUS | Status: AC
  Filled 2021-08-07: qty 500

## 2021-08-07 MED ORDER — SODIUM CHLORIDE 0.9 % IV SOLN: INTRAVENOUS | Status: DC

## 2021-08-07 MED ORDER — ONDANSETRON HCL 4 MG/2ML IJ SOLN
INTRAMUSCULAR | Status: DC | PRN
  Administered 2021-08-07: 4 mg via INTRAVENOUS

## 2021-08-07 MED ORDER — RIVAROXABAN 20 MG PO TABS
20.0000 mg | ORAL_TABLET | Freq: Every day | ORAL | Status: DC
  Administered 2021-08-07: 20 mg via ORAL
  Filled 2021-08-07: qty 1

## 2021-08-07 MED ORDER — HEPARIN (PORCINE) IN NACL 2000-0.9 UNIT/L-% IV SOLN
INTRAVENOUS | Status: AC
  Filled 2021-08-07: qty 1000

## 2021-08-07 MED ORDER — SODIUM CHLORIDE 0.9% FLUSH: 3.0000 mL | INTRAVENOUS | Status: DC | PRN

## 2021-08-07 MED ORDER — MONTELUKAST SODIUM 10 MG PO TABS: 10.0000 mg | ORAL_TABLET | Freq: Every day | ORAL | Status: DC

## 2021-08-07 MED ORDER — DEXAMETHASONE SODIUM PHOSPHATE 10 MG/ML IJ SOLN
INTRAMUSCULAR | Status: DC | PRN
  Administered 2021-08-07: 5 mg via INTRAVENOUS

## 2021-08-07 MED ORDER — VANCOMYCIN HCL IN DEXTROSE 1-5 GM/200ML-% IV SOLN
1000.0000 mg | INTRAVENOUS | Status: AC
  Administered 2021-08-07: 1000 mg via INTRAVENOUS
  Filled 2021-08-07: qty 200

## 2021-08-07 MED ORDER — CHLORHEXIDINE GLUCONATE 0.12 % MT SOLN
OROMUCOSAL | Status: AC
  Administered 2021-08-07: 15 mL
  Filled 2021-08-07: qty 15

## 2021-08-07 MED ORDER — SODIUM CHLORIDE 0.9% FLUSH
3.0000 mL | Freq: Two times a day (BID) | INTRAVENOUS | Status: DC
  Administered 2021-08-07: 3 mL via INTRAVENOUS

## 2021-08-07 MED ORDER — LIDOCAINE 2% (20 MG/ML) 5 ML SYRINGE
INTRAMUSCULAR | Status: DC | PRN
  Administered 2021-08-07: 60 mg via INTRAVENOUS

## 2021-08-07 MED ORDER — FLUOXETINE HCL 20 MG PO CAPS: 40.0000 mg | ORAL_CAPSULE | Freq: Every day | ORAL | Status: DC

## 2021-08-07 MED ORDER — SUGAMMADEX SODIUM 200 MG/2ML IV SOLN
INTRAVENOUS | Status: DC | PRN
  Administered 2021-08-07: 160 mg via INTRAVENOUS

## 2021-08-07 MED ORDER — BUPROPION HCL ER (XL) 150 MG PO TB24: 150.0000 mg | ORAL_TABLET | Freq: Every day | ORAL | Status: DC

## 2021-08-07 MED ORDER — LACTATED RINGERS IV SOLN: INTRAVENOUS | Status: DC

## 2021-08-07 MED ORDER — HEPARIN (PORCINE) IN NACL 1000-0.9 UT/500ML-% IV SOLN
INTRAVENOUS | Status: DC | PRN
  Administered 2021-08-07: 500 mL

## 2021-08-07 MED ORDER — AMLODIPINE BESYLATE 5 MG PO TABS: 5.0000 mg | ORAL_TABLET | Freq: Every day | ORAL | Status: DC

## 2021-08-07 MED ORDER — ACETAMINOPHEN 500 MG PO TABS
1000.0000 mg | ORAL_TABLET | Freq: Once | ORAL | Status: AC
  Administered 2021-08-07: 1000 mg via ORAL
  Filled 2021-08-07: qty 2

## 2021-08-07 MED ORDER — IOHEXOL 350 MG/ML SOLN
INTRAVENOUS | Status: DC | PRN
  Administered 2021-08-07 (×2): 10 mL

## 2021-08-07 MED ORDER — PROTAMINE SULFATE 10 MG/ML IV SOLN
INTRAVENOUS | Status: DC | PRN
  Administered 2021-08-07: 30 mg via INTRAVENOUS

## 2021-08-07 MED ORDER — AZITHROMYCIN 500 MG PO TABS
500.0000 mg | ORAL_TABLET | Freq: Once | ORAL | 0 refills | Status: AC
Start: 2021-08-07 — End: 2021-08-07

## 2021-08-07 MED ORDER — ACETAMINOPHEN 325 MG PO TABS: 650.0000 mg | ORAL_TABLET | ORAL | Status: DC | PRN

## 2021-08-07 MED ORDER — ONDANSETRON HCL 4 MG/2ML IJ SOLN: 4.0000 mg | Freq: Four times a day (QID) | INTRAMUSCULAR | Status: DC | PRN

## 2021-08-07 SURGICAL SUPPLY — 18 items
CATH DIAG 6FR PIGTAIL ANGLED (CATHETERS) ×1 IMPLANT
CLOSURE PERCLOSE PROSTYLE (VASCULAR PRODUCTS) ×2 IMPLANT
DEVICE WATCHMAN FLX PROC (KITS) IMPLANT
DILATOR VESSEL 38 20CM 11FR (INTRODUCER) ×1 IMPLANT
KIT HEART LEFT (KITS) ×2 IMPLANT
KIT SHEA VERSACROSS LAAC CONNE (KITS) ×1 IMPLANT
PACK CARDIAC CATHETERIZATION (CUSTOM PROCEDURE TRAY) ×2 IMPLANT
PAD DEFIB RADIO PHYSIO CONN (PAD) ×2 IMPLANT
SHEATH PERFORMER 16FR 30 (SHEATH) ×1 IMPLANT
SHEATH PINNACLE 8F 10CM (SHEATH) ×1 IMPLANT
SHEATH PROBE COVER 6X72 (BAG) ×2 IMPLANT
SYS WATCHMAN FXD DBL (SHEATH) ×2
SYSTEM WATCHMAN FXD DBL (SHEATH) IMPLANT
TRANSDUCER W/STOPCOCK (MISCELLANEOUS) ×2 IMPLANT
TUBING CIL FLEX 10 FLL-RA (TUBING) ×2 IMPLANT
WATCHMAN FLX 27 (Prosthesis & Implant Heart) ×1 IMPLANT
WATCHMAN FLX PROCEDURE DEVICE (KITS) ×2 IMPLANT
WATCHMAN PROCED TRUSEAL ACCESS (SHEATH) ×1 IMPLANT

## 2021-08-07 NOTE — Transfer of Care (Signed)
Immediate Anesthesia Transfer of Care Note  Patient: Michelle Johnston  Procedure(s) Performed: LEFT ATRIAL APPENDAGE OCCLUSION TRANSESOPHAGEAL ECHOCARDIOGRAM (TEE)  Patient Location: PACU  Anesthesia Type:General  Level of Consciousness: awake, alert  and oriented  Airway & Oxygen Therapy: Patient Spontanous Breathing and Patient connected to nasal cannula oxygen  Post-op Assessment: Report given to RN  Post vital signs: Reviewed and stable  Last Vitals:  Vitals Value Taken Time  BP 123/48   Temp    Pulse 57 08/07/21 1012  Resp 15 08/07/21 1012  SpO2 97 % 08/07/21 1012  Vitals shown include unvalidated device data.  Last Pain:  Vitals:   08/07/21 0806  TempSrc:   PainSc: 0-No pain      Patients Stated Pain Goal: 0 (70/26/37 8588)  Complications: There were no known notable events for this encounter.

## 2021-08-07 NOTE — Anesthesia Postprocedure Evaluation (Signed)
Anesthesia Post Note  Patient: Michelle Johnston  Procedure(s) Performed: LEFT ATRIAL APPENDAGE OCCLUSION TRANSESOPHAGEAL ECHOCARDIOGRAM (TEE)     Patient location during evaluation: PACU Anesthesia Type: General Level of consciousness: awake and alert Pain management: pain level controlled Vital Signs Assessment: post-procedure vital signs reviewed and stable Respiratory status: spontaneous breathing, nonlabored ventilation, respiratory function stable and patient connected to nasal cannula oxygen Cardiovascular status: blood pressure returned to baseline and stable Postop Assessment: no apparent nausea or vomiting Anesthetic complications: no   There were no known notable events for this encounter.  Last Vitals:  Vitals:   08/07/21 1140 08/07/21 1200  BP: (!) 107/36 (!) 121/41  Pulse: (!) 51 (!) 50  Resp: 13 13  Temp:    SpO2: 95% 95%    Last Pain:  Vitals:   08/07/21 1045  TempSrc: Temporal  PainSc: 0-No pain                 Tiajuana Amass

## 2021-08-07 NOTE — Anesthesia Procedure Notes (Signed)
Procedure Name: Intubation Date/Time: 08/07/2021 9:19 AM  Performed by: Barrington Ellison, CRNAPre-anesthesia Checklist: Patient identified, Emergency Drugs available, Suction available and Patient being monitored Patient Re-evaluated:Patient Re-evaluated prior to induction Oxygen Delivery Method: Circle System Utilized Preoxygenation: Pre-oxygenation with 100% oxygen Induction Type: IV induction Ventilation: Mask ventilation without difficulty Laryngoscope Size: Mac and 3 Grade View: Grade I Tube type: Oral Number of attempts: 1 Airway Equipment and Method: Stylet and Oral airway Placement Confirmation: ETT inserted through vocal cords under direct vision, positive ETCO2 and breath sounds checked- equal and bilateral Secured at: 21 cm Tube secured with: Tape Dental Injury: Teeth and Oropharynx as per pre-operative assessment

## 2021-08-07 NOTE — Anesthesia Preprocedure Evaluation (Signed)
Anesthesia Evaluation  Patient identified by MRN, date of birth, ID band Patient awake    Reviewed: Allergy & Precautions, NPO status , Patient's Chart, lab work & pertinent test results  Airway Mallampati: II  TM Distance: >3 FB Neck ROM: Full    Dental  (+) Dental Advisory Given   Pulmonary sleep apnea , former smoker,    breath sounds clear to auscultation       Cardiovascular hypertension, Pt. on medications and Pt. on home beta blockers + Peripheral Vascular Disease  + dysrhythmias Atrial Fibrillation + Valvular Problems/Murmurs AI  Rhythm:Regular Rate:Normal     Neuro/Psych negative neurological ROS     GI/Hepatic negative GI ROS, Neg liver ROS,   Endo/Other  negative endocrine ROS  Renal/GU negative Renal ROS     Musculoskeletal  (+) Arthritis ,   Abdominal   Peds  Hematology negative hematology ROS (+)   Anesthesia Other Findings   Reproductive/Obstetrics                             Lab Results  Component Value Date   WBC 5.9 07/14/2021   HGB 12.5 07/14/2021   HCT 35.9 07/14/2021   MCV 99 (H) 07/14/2021   PLT 306 07/14/2021   Lab Results  Component Value Date   CREATININE 0.70 07/14/2021   BUN 17 07/14/2021   NA 131 (L) 07/14/2021   K 4.7 07/14/2021   CL 95 (L) 07/14/2021   CO2 27 07/14/2021    Anesthesia Physical Anesthesia Plan  ASA: 3  Anesthesia Plan: General   Post-op Pain Management: Tylenol PO (pre-op)* and Minimal or no pain anticipated   Induction: Intravenous  PONV Risk Score and Plan: 3 and Dexamethasone, Ondansetron and Treatment may vary due to age or medical condition  Airway Management Planned: Oral ETT  Additional Equipment: ClearSight  Intra-op Plan:   Post-operative Plan: Extubation in OR  Informed Consent: I have reviewed the patients History and Physical, chart, labs and discussed the procedure including the risks, benefits and  alternatives for the proposed anesthesia with the patient or authorized representative who has indicated his/her understanding and acceptance.     Dental advisory given  Plan Discussed with: CRNA  Anesthesia Plan Comments:         Anesthesia Quick Evaluation

## 2021-08-07 NOTE — Plan of Care (Signed)

## 2021-08-07 NOTE — Progress Notes (Signed)
Patient continues to rest in bed comfortably. VSS. Rt groin remains soft with no active bleeding or hematoma noted. Will continue to monitor patient.

## 2021-08-07 NOTE — Discharge Instructions (Signed)
[facility] Procedure, Care After  Procedure MD: Dr. Benson Norway Clinical Coordinator: Lenice Llamas, RN  This sheet gives you information about how to care for yourself after your procedure. Your health care provider may also give you more specific instructions. If you have problems or questions, contact your health care provider.  What can I expect after the procedure? After the procedure, it is common to have: Bruising around your puncture site. Tenderness around your puncture site. Tiredness (fatigue).  Medication instructions It is very important to continue to take your blood thinner as directed by your doctor after the Watchman procedure. Call your procedure doctor's office with question or concerns. If you are on Coumadin (warfarin), you will have your INR checked the week after your procedure, with a goal INR of 2.0 - 3.0. Please follow your medication instructions on your discharge summary. Only take the medications listed on your discharge paperwork. You will require antibiotics to be take one hour prior to dental cleanings for the next 6 months.   Follow up You will be seen in approximately 3-4 weeks after your procedure You will have a repeat CT scan approximately 8 weeks after your procedure mark to check your device You will follow up the MD/APP who performed your procedure 6 months after your procedure The Watchman Clinical Coordinator will check in with you from time to time, including 1 and 2 years after your procedure.    Follow these instructions at home: Puncture site care  Follow instructions from your health care provider about how to take care of your puncture site. Make sure you: If present, leave stitches (sutures), skin glue, or adhesive strips in place.  If a large square bandage is present, this may be removed 24 hours after surgery.  Check your puncture site every day for signs of infection. Check for: Redness, swelling, or pain. Fluid or blood. If your  puncture site starts to bleed, lie down on your back, apply firm pressure to the area, and contact your health care provider. Warmth. Pus or a bad smell. Driving Do not drive yourself home if you received sedation Do not drive for at least 4 days after your procedure or however long your health care provider recommends. (Do not resume driving if you have previously been instructed not to drive for other health reasons.) Do not spend greater than 1 hour at a time in a car for the first 3 days. Stop and take a break with a 5 minute walk at least every hour.  Do not drive or use heavy machinery while taking prescription pain medicine.  Activity Avoid activities that take a lot of effort, including exercise, for at least 7 days after your procedure. For the first 3 days, avoid sitting for longer than one hour at a time.  Avoid alcoholic beverages, signing paperwork, or participating in legal proceedings for 24 hours after receiving sedation Do not lift anything that is heavier than 10 lb (4.5 kg) for one week.  No sexual activity for 1 week.  Return to your normal activities as told by your health care provider. Ask your health care provider what activities are safe for you. General instructions Take over-the-counter and prescription medicines only as told by your health care provider. Do not use any products that contain nicotine or tobacco, such as cigarettes and e-cigarettes. If you need help quitting, ask your health care provider. You may shower after 24 hours, but Do not take baths, swim, or use a hot tub for 1  week.  Do not drink alcohol for 24 hours after your procedure. Keep all follow-up visits as told by your health care provider. This is important. Dental Work: You will require antibiotics prior to any dental work, including cleanings, for 6 months after your Watchman implantation to help protect you from infection. After 6 months, antibiotics are no longer required. Contact a health  care provider if: You have redness, mild swelling, or pain around your puncture site. You have soreness in your throat or at your puncture site that does not improve after several days You have fluid or blood coming from your puncture site that stops after applying firm pressure to the area. Your puncture site feels warm to the touch. You have pus or a bad smell coming from your puncture site. You have a fever. You have chest pain or discomfort that spreads to your neck, jaw, or arm. You are sweating a lot. You feel nauseous. You have a fast or irregular heartbeat. You have shortness of breath. You are dizzy or light-headed and feel the need to lie down. You have pain or numbness in the arm or leg closest to your puncture site. Get help right away if: Your puncture site suddenly swells. Your puncture site is bleeding and the bleeding does not stop after applying firm pressure to the area. These symptoms may represent a serious problem that is an emergency. Do not wait to see if the symptoms will go away. Get medical help right away. Call your local emergency services (911 in the [place] Do not drive yourself to the hospital. Summary After the procedure, it is normal to have bruising and tenderness at the puncture site in your groin, neck, or forearm. Check your puncture site every day for signs of infection. Get help right away if your puncture site is bleeding and the bleeding does not stop after applying firm pressure to the area. This is a medical emergency.  This information is not intended to replace advice given to you by your health care provider. Make sure you discuss any questions you have with your health care provider.

## 2021-08-07 NOTE — Interval H&P Note (Signed)
History and Physical Interval Note:  08/07/2021 7:46 AM  Michelle Johnston  has presented today for surgery, with the diagnosis of afib.  The various methods of treatment have been discussed with the patient and family. After consideration of risks, benefits and other options for treatment, the patient has consented to  Procedure(s): LEFT ATRIAL APPENDAGE OCCLUSION (N/A) TRANSESOPHAGEAL ECHOCARDIOGRAM (TEE) (N/A) as a surgical intervention.  The patient's history has been reviewed, patient examined, no change in status, stable for surgery.  I have reviewed the patient's chart and labs.  Questions were answered to the patient's satisfaction.     Sotirios Navarro T Lorali Khamis

## 2021-08-07 NOTE — Discharge Summary (Signed)
[facility]    Johnston ID: Michelle Johnston,  MRN: [id number], DOB/AGE: 73-Jun-1952 73 y.o.  Admit date: 08/07/2021 Discharge date: 08/07/2021  Primary Care Physician: Mayra Neer, MD  Primary Cardiologist: Donato Heinz, MD  Electrophysiologist: Vickie Epley, MD  Primary Discharge Diagnosis:  Persistent Atrial Fibrillation Poor candidacy for long term anticoagulation due to refusal of long-term oral anticoagulation  Secondary Discharge Diagnosis:  -HTN -Aortic insufficiency -PAD -OSA (intolerant to CPAP) -Osteoarthritis -TIA  Procedures This Admission:  CONCLUSIONS:  1.Successful implantation of a WATCHMAN left atrial appendage occlusive device    2. TEE demonstrating no LAA thrombus 3. No early apparent complications.    Post Implant Anticoagulation Strategy: Continue Xarelto '20mg'$  PO daily for 45 days. Stop Xarelto 45 days after implant and start Plavix '75mg'$  by mouth once daily. CT scan at 60 days post implant to assess device placement.  Brief HPI: Michelle Johnston is a 71 y.o. female with a history of HTN, aortic insufficiency, PAD, OSA (intolerant to CPAP), osteoarthritis, TIA, and persistent AF on Xarelto who prefers to stop a/c due to chronic NSAID use secondary to arthritis treatment.    Michelle Johnston was referred to Dr. Quentin Ore for Michelle evaluation of possible Watchman implant. At Michelle time Michelle Johnston reported that Michelle Johnston was unable to take her meloxicam to treat her arthritis while on Xarelto 20 mg daily. Michelle Johnston has had issues with HTN and was previously treated with metoprolol which was transitioned to carvedilol 6.25 mg twice daily for better BP control. Michelle Johnston has been unable to tolerate her CPAP and has been referred to ENT for [facility].  Michelle Johnston underwent pre-procedure CT imaging which showed anatomy suitable for implant. Michelle risks, benefits, and alternatives to left atrial appendage occlusive device placement device had been reviewed with Michelle Johnston and Dr. Quentin Ore and  Michelle Johnston wished to proceed and has been scheduled on 08/07/21.   Hospital Course:  Michelle Johnston was admitted and underwent left atrial appendage occlusive device placement with a Watchman FLX 46m device.   Groin site has been stable without sign of hematoma or bleeding. Michelle Johnston is now considered for same day discharge today, 08/07/21. Wound care and restrictions were reviewed with Michelle Johnston. Michelle Johnston has been scheduled for post procedure follow up with JKathyrn Drown NP in approximately 3-4 weeks. Medication plan will be to continue Xarelto '20mg'$  PO daily for 45 days. At Michelle 45 day mark, Michelle Johnston will stop Xarelto and start Plavix '75mg'$  by mouth once daily. Michelle Johnston will then undergo a repeat CT scan at approximately 60 days post implant to assess device placement. Michelle Johnston will require SBE prophylaxis with Amoxicillin 2g one hour prior to dental cleanings and procedures only for Michelle next 6 months. Will RX at d/c.   TIA hx: Stable with no new neuro symptoms.   Hypertension: Stable, no medication changes.   Bradycardia: HR's maintaining in Michelle 50's. No change right now however may need to reduce carvedilol at follow up.   Obstructive sleep apnea: Does not tolerate tolerate CPAP. Has upcoming appointment with ENT for consideration of Inspire.    Mild/mod AR: Found on pre Watchman CT imaging. Continue to follow with surveillance echocardiogram.   Physical Exam: Vitals:   08/07/21 1100 08/07/21 1120 08/07/21 1140 08/07/21 1200  BP: (!) 107/34 (!) 105/36 (!) 107/36 (!) 121/41  Pulse: (!) 51 (!) 50 (!) 51 (!) 50  Resp: '12 13 13 13  '$ Temp:      TempSrc:  SpO2: 91% 92% 95% 95%  Weight:      Height:       Labs:   Lab Results  Component Value Date   WBC 5.9 07/14/2021   HGB 12.5 07/14/2021   HCT 35.9 07/14/2021   MCV 99 (H) 07/14/2021   PLT 306 07/14/2021   No results for input(s): "NA", "K", "CL", "CO2", "BUN", "CREATININE", "CALCIUM", "PROT", "BILITOT", "ALKPHOS", "ALT", "AST", "GLUCOSE" in Michelle last  168 hours.  Invalid input(s): "LABALBU"  Discharge Medications:  Allergies as of 08/07/2021       Reactions   Dexlansoprazole Other (See Comments)   Other reaction(s): ineffective   Flecainide    Pt had stroke Other reaction(s): Possible stroke   Other    Other reaction(s): head congestion(Nasal Steroid)   Cefaclor Hives, Rash   Ciprofibrate Rash   (see OV 12/24/2011)   Ciprofloxacin Hives   Metronidazole Hives   Other reaction(s): rash ?? (see OV 12/24/2011)        Medication List     TAKE these medications    acetaminophen 500 MG tablet Commonly known as: TYLENOL Take 1,000 mg by mouth every 6 (six) hours as needed. pain   acidophilus Caps capsule Take 1 capsule by mouth daily.   albuterol 108 (90 Base) MCG/ACT inhaler Commonly known as: VENTOLIN HFA Inhale 1-2 puffs into Michelle lungs every 6 (six) hours as needed for wheezing or shortness of breath.   amLODipine 5 MG tablet Commonly known as: NORVASC Take 5 mg by mouth at bedtime.   azithromycin 500 MG tablet Commonly known as: Zithromax Take 1 tablet (500 mg total) by mouth once for 1 dose. Take one tablet by mouth one hour prior to dental cleanings and procedures for Michelle next 6 months.   b complex vitamins tablet Take 1 tablet by mouth daily.   buPROPion 300 MG 24 hr tablet Commonly known as: WELLBUTRIN XL Take 150 mg by mouth daily.   calcium carbonate 1250 (500 Ca) MG tablet Commonly known as: OS-CAL - dosed in mg of elemental calcium Take 1 tablet by mouth daily with breakfast.   carvedilol 12.5 MG tablet Commonly known as: COREG Take 1 tablet (12.5 mg total) by mouth 2 (two) times daily with a meal.   cetirizine 10 MG tablet Commonly known as: ZYRTEC Take 10 mg by mouth at bedtime.   Fish Oil 1000 MG Caps Take 1,000 mg by mouth daily.   FLUoxetine 40 MG capsule Commonly known as: PROZAC Take 40 mg by mouth daily.   Fluticasone-Salmeterol 250-50 MCG/DOSE Aepb Commonly known as:  ADVAIR Inhale 1 puff into Michelle lungs 2 (two) times daily as needed (asthma).   gabapentin 300 MG capsule Commonly known as: NEURONTIN Take 300 mg by mouth 3 (three) times daily.   guaiFENesin 600 MG 12 hr tablet Commonly known as: MUCINEX Take 600 mg by mouth daily as needed for cough or to loosen phlegm.   ipratropium 0.06 % nasal spray Commonly known as: ATROVENT Place 1 spray into both nostrils daily as needed for rhinitis.   latanoprost 0.005 % ophthalmic solution Commonly known as: XALATAN Place 1 drop into both eyes every morning.   meloxicam 15 MG tablet Commonly known as: MOBIC Take 7.5 mg by mouth daily as needed for pain.   montelukast 10 MG tablet Commonly known as: SINGULAIR Take 10 mg by mouth at bedtime.   multivitamin with minerals tablet Take 1 tablet by mouth daily.   nitroGLYCERIN 0.4 MG SL tablet Commonly known  as: NITROSTAT Place 0.4 mg under Michelle tongue every 5 (five) minutes as needed for chest pain.   omeprazole 20 MG capsule Commonly known as: PRILOSEC Take 20 mg by mouth daily as needed (acid reflux).   rivaroxaban 20 MG Tabs tablet Commonly known as: XARELTO Take 1 tablet (20 mg total) by mouth daily with supper.   rosuvastatin 20 MG tablet Commonly known as: CRESTOR Take 1 tablet (20 mg total) by mouth daily.   vitamin C 500 MG tablet Commonly known as: ASCORBIC ACID Take 500 mg by mouth daily.   Vitamin D 50 MCG (2000 UT) tablet Take 2,000 Units by mouth daily.        Disposition:  Home  Discharge Instructions     Call MD for:   Complete by: As directed    Call MD for:  difficulty breathing, headache or visual disturbances   Complete by: As directed    Call MD for:  extreme fatigue   Complete by: As directed    Call MD for:  hives   Complete by: As directed    Call MD for:  persistant dizziness or light-headedness   Complete by: As directed    Call MD for:  persistant nausea and vomiting   Complete by: As directed     Call MD for:  redness, tenderness, or signs of infection (pain, swelling, redness, odor or green/yellow discharge around incision site)   Complete by: As directed    Call MD for:  severe uncontrolled pain   Complete by: As directed    Call MD for:  temperature >100.4   Complete by: As directed    Diet - low sodium heart healthy   Complete by: As directed    Increase activity slowly   Complete by: As directed        Follow-up Information     Tommie Raymond, NP Follow up.   Specialty: Cardiology Why: We will call you to let you know Michelle date and time of your follow up appointment. Contact information: [address] [place] [address] [phone]                 Duration of Discharge Encounter: Greater than 30 minutes including physician time.  Signed, Kathyrn Drown, NP  08/07/2021 1:20 PM

## 2021-08-08 ENCOUNTER — Encounter (HOSPITAL_COMMUNITY): Payer: Self-pay | Admitting: Cardiology

## 2021-08-08 ENCOUNTER — Telehealth: Payer: Self-pay

## 2021-08-08 NOTE — Telephone Encounter (Signed)
  [facility] Team  Contacted the patient regarding discharge from [facility] on 08/07/2021  The patient understands to follow up with Kathyrn Drown on 09/08/2021 in preparation for CT on 10/09/2021.  The patient understands discharge instructions? Yes  The patient understands medications and regimen? Yes   The patient reports groin sites look healthy with no signs or symptoms of bleeding or infection.  The patient understands to call with any questions or concerns prior to scheduled visit.

## 2021-09-03 ENCOUNTER — Ambulatory Visit: Payer: Medicare HMO | Admitting: Physician Assistant

## 2021-09-04 ENCOUNTER — Encounter: Payer: Self-pay | Admitting: Physician Assistant

## 2021-09-04 ENCOUNTER — Ambulatory Visit: Payer: Medicare HMO | Admitting: Physician Assistant

## 2021-09-04 DIAGNOSIS — M1711 Unilateral primary osteoarthritis, right knee: Secondary | ICD-10-CM | POA: Diagnosis not present

## 2021-09-04 MED ORDER — LIDOCAINE HCL 1 % IJ SOLN
3.0000 mL | INTRAMUSCULAR | Status: AC | PRN
  Administered 2021-09-04: 3 mL

## 2021-09-04 MED ORDER — METHYLPREDNISOLONE ACETATE 40 MG/ML IJ SUSP
40.0000 mg | INTRAMUSCULAR | Status: AC | PRN
  Administered 2021-09-04: 40 mg via INTRA_ARTICULAR

## 2021-09-04 NOTE — Progress Notes (Signed)
   Procedure Note  Patient: Michelle Johnston             Date of Birth: [birth year]           MRN: [id number]             Visit Date: 09/04/2021  HPI: Mrs. Coyne returns today with right knee pain and swelling.  Since she was last seen she has had a watchman left atrial appendage occlusive device placed.  States overall her heart is doing well.  She is hoping to get off of all anticoagulation so she can go back on her "arthritis medicines".  She has had no new injury to the right knee.  States that her knee is swollen and needs to be aspirated.  Review of systems: Denies fevers chills.  Physical exam: Right knee good range of motion.  Positive effusion.  No abnormal warmth erythema.   Procedures: Visit Diagnoses:  1. Primary osteoarthritis of right knee     Large Joint Inj on 09/04/2021 4:22 PM Indications: pain Details: 22 G 1.5 in needle, anterolateral approach  Arthrogram: No  Medications: 3 mL lidocaine 1 %; 40 mg methylPREDNISolone acetate 40 MG/ML Aspirate: 57 mL yellow Outcome: tolerated well, no immediate complications Procedure, treatment alternatives, risks and benefits explained, specific risks discussed. Consent was given by the patient. Immediately prior to procedure a time out was called to verify the correct patient, procedure, equipment, support staff and site/side marked as required. Patient was prepped and draped in the usual sterile fashion.      Plan: She will follow-up with [place] as needed she knows to wait at least 3 months between injections.  Ace bandage was applied to the knee which she will remove this evening prior to the retiring to bed.  Questions were encouraged and answered

## 2021-09-05 NOTE — Progress Notes (Unsigned)
[facility]                                     Cardiology Office Note:    Date:  09/05/2021   ID:  Michelle Johnston, DOB 05-21-1950, MRN [id number]  PCP:  Mayra Neer, MD  [facility] Cardiologist:  Donato Heinz, MD  [place] Electrophysiologist:  Vickie Epley, MD   Referring MD: Mayra Neer, MD   No chief complaint on file. ***  History of Present Illness:    Michelle Johnston is a 71 y.o. female with a hx of HTN, aortic insufficiency, PAD, OSA (intolerant to CPAP), osteoarthritis, TIA, and persistent AF on Xarelto who prefers to stop a/c due to chronic NSAID use secondary to arthritis treatment.     Michelle Johnston was referred to Dr. Quentin Ore for the evaluation of possible Watchman implant. At the time she reported that she was unable to take her meloxicam to treat her arthritis while on Xarelto 20 mg daily. She has had issues with HTN and was previously treated with metoprolol which was transitioned to carvedilol 6.25 mg twice daily for better BP control. She has been unable to tolerate her CPAP and has been referred to ENT for [facility].   She underwent pre-procedure CT imaging which showed anatomy suitable for implant. The risks, benefits, and alternatives to left atrial appendage occlusive device placement device had been reviewed with the patient and Dr. Quentin Ore and the patient wished to proceed and has been scheduled on 08/07/21.    Hospital Course:  The patient was admitted and underwent left atrial appendage occlusive device placement with a Watchman FLX 81m device.   Groin site has been stable without sign of hematoma or bleeding. She is now considered for same day discharge today, 08/07/21. Wound care and restrictions were reviewed with the patient. The patient has been scheduled for post procedure follow up with JKathyrn Drown NP in approximately 3-4 weeks. Medication plan will be to continue Xarelto '20mg'$  PO daily for 45 days. At the 45 day mark, she will  stop Xarelto and start Plavix '75mg'$  by mouth once daily. She will then undergo a repeat CT scan at approximately 60 days post implant to assess device placement. She will require SBE prophylaxis with Amoxicillin 2g one hour prior to dental cleanings and procedures only for the next 6 months. Will RX at d/c.    TIA hx: Stable with no new neuro symptoms.    Hypertension: Stable, no medication changes.    Bradycardia: HR's maintaining in the 50's. No change right now however may need to reduce carvedilol at follow up.   Obstructive sleep apnea: Does not tolerate tolerate CPAP. Has upcoming appointment with ENT for consideration of Inspire.    Mild/mod AR: Found on pre Watchman CT imaging. Continue to follow with surveillance echocardiogram.       Past Medical History:  Diagnosis Date   Anxiety    Arthritis    Bradycardia    Bronchitis    Depression    Dyspnea    Glaucoma    Hypertension    Presence of Watchman left atrial appendage closure device 08/07/2021   Watchman FLX 222mwith Dr. LaQuentin Ore Seasonal allergies    Sleep apnea    Wears glasses     Past Surgical History:  Procedure Laterality Date   ABDOMINAL HYSTERECTOMY  1990   ATRIAL  FIBRILLATION ABLATION N/A 08/12/2020   Procedure: ATRIAL FIBRILLATION ABLATION;  Surgeon: Vickie Epley, MD;  Location: [place] [facility];  Service: Cardiovascular;  Laterality: N/A;   BREAST BIOPSY Right    benign   CARDIOVERSION N/A 08/15/2019   Procedure: CARDIOVERSION;  Surgeon: Nigel Mormon, MD;  Location: [place] [facility];  Service: Cardiovascular;  Laterality: N/A;   COLONOSCOPY     LEFT ATRIAL APPENDAGE OCCLUSION N/A 08/07/2021   Procedure: LEFT ATRIAL APPENDAGE OCCLUSION;  Surgeon: Vickie Epley, MD;  Location: [place] [facility];  Service: Cardiovascular;  Laterality: N/A;   meniscus tear knee     left knee   NASAL SINUS SURGERY  1975   ORIF ANKLE FRACTURE  2009   left   TEE WITHOUT CARDIOVERSION N/A 08/07/2021    Procedure: TRANSESOPHAGEAL ECHOCARDIOGRAM (TEE);  Surgeon: Vickie Epley, MD;  Location: [place] [facility];  Service: Cardiovascular;  Laterality: N/A;   TOTAL KNEE ARTHROPLASTY Left 02/03/2019   Procedure: LEFT TOTAL KNEE ARTHROPLASTY;  Surgeon: Mcarthur Rossetti, MD;  [facility]: [facility];  Service: Orthopedics;  Laterality: Left;   TURBINATE REDUCTION Bilateral 06/04/2014   Procedure: BILATERAL TURBINATE REDUCTION;  Surgeon: Leta Baptist, MD;  Location: [place];  Service: ENT;  Laterality: Bilateral;   TYMPANOSTOMY TUBE PLACEMENT     left    Current Medications: No outpatient medications have been marked as taking for the 09/08/21 encounter (Appointment) with [facility].     Allergies:   Dexlansoprazole, Flecainide, Other, Cefaclor, Ciprofibrate, Ciprofloxacin, and Metronidazole   Social History   Socioeconomic History   Marital status: Married    Spouse name: Not on file   Number of children: 0   Years of education: Not on file   Highest education level: Not on file  Occupational History   Not on file  Tobacco Use   Smoking status: Former    Packs/day: 2.00    Years: 30.00    Total pack years: 60.00    Types: Cigarettes    Quit date: 05/28/1998    Years since quitting: 23.2   Smokeless tobacco: Never  Vaping Use   Vaping Use: Never used  Substance and Sexual Activity   Alcohol use: Yes    Alcohol/week: 3.0 - 4.0 standard drinks of alcohol    Types: 2 Glasses of wine, 1 - 2 Cans of beer per week    Comment: daily-wine daily-1 glass   Drug use: No   Sexual activity: Not on file  Other Topics Concern   Not on file  Social History Narrative   Not on file   Social Determinants of Health   Financial Resource Strain: Not on file  Food Insecurity: Not on file  Transportation Needs: Not on file  Physical Activity: Not on file  Stress: Not on file  Social Connections: Not on file     Family History: The patient's ***family  history includes Hyperlipidemia in her mother; Hypertension in her mother; Lung cancer in her father; Lymphoma in her mother.  ROS:   Please see the history of present illness.    All other systems reviewed and are negative.  EKGs/Labs/Other Studies Reviewed:    The following studies were reviewed today:  LAAO closure:   CONCLUSIONS:  1.Successful implantation of a WATCHMAN left atrial appendage occlusive device    2. TEE demonstrating no LAA thrombus 3. No early apparent complications.    Post Implant Anticoagulation Strategy: Continue Xarelto '20mg'$  PO daily for 45  days. Stop Xarelto 45 days after implant and start Plavix '75mg'$  by mouth once daily. CT scan at 60 days post implant to assess device placement.  EKG:  EKG is *** ordered today.  The ekg ordered today demonstrates ***  Recent Labs: 07/14/2021: BUN 17; Creatinine, Ser 0.70; Hemoglobin 12.5; Platelets 306; Potassium 4.7; Sodium 131  Recent Lipid Panel    Component Value Date/Time   CHOL 214 (H) 09/23/2020 1213   TRIG 109 09/23/2020 1213   HDL 104 09/23/2020 1213   CHOLHDL 2.1 09/23/2020 1213   [place] 92 09/23/2020 1213     Risk Assessment/Calculations:   {Does this patient have ATRIAL FIBRILLATION?[phone]}   CHA2DS2-VASc Score = 6 [ CHF History: 0, HTN History: 1, Diabetes History: 0, Stroke History: 2, Vascular Disease History: 1, Age Score: 1, Gender Score: 1].  Therefore, the patient's annual risk of stroke is 9.7 %.        HAS-BLED score *** Hypertension (Uncontrolled in 30 days)  {YES/NO:21197} Abnormal renal and liver function (Dialysis, transplant, Cr >2.26 mg/dL /Cirrhosis or Bilirubin >2x Normal or AST/ALT/AP >3x Normal) {YES/NO:21197} Stroke {YES/NO:21197} Bleeding {YES/NO:21197} Labile INR (Unstable/high INR) {YES/NO:21197} Elderly (>65) {YES/NO:21197} Drugs or alcohol (? 8 drinks/week, anti-plt or NSAID) {YES/NO:21197}   Physical Exam:    VS:  There were no vitals taken for this visit.     Wt Readings from Last 3 Encounters:  08/07/21 155 lb (70.3 kg)  07/10/21 155 lb 6.4 oz (70.5 kg)  07/01/21 153 lb 12.8 oz (69.8 kg)     GEN: *** Well nourished, well developed in no acute distress HEENT: Normal NECK: No JVD; No carotid bruits LYMPHATICS: No lymphadenopathy CARDIAC: ***RRR, no murmurs, rubs, gallops RESPIRATORY:  Clear to auscultation without rales, wheezing or rhonchi  ABDOMEN: Soft, non-tender, non-distended MUSCULOSKELETAL:  No edema; No deformity  SKIN: Warm and dry NEUROLOGIC:  Alert and oriented x 3 PSYCHIATRIC:  Normal affect   ASSESSMENT:    No diagnosis found. PLAN:    In order of problems listed above:       {Are you ordering a CV Procedure (e.g. stress test, cath, DCCV, TEE, etc)?   Press F2        :[id number]}    Medication Adjustments/Labs and Tests Ordered: Current medicines are reviewed at length with the patient today.  Concerns regarding medicines are outlined above.  No orders of the defined types were placed in this encounter.  No orders of the defined types were placed in this encounter.   There are no Patient Instructions on file for this visit.   Signed, Kathyrn Drown, NP  09/05/2021 11:58 AM    [place]

## 2021-09-08 ENCOUNTER — Ambulatory Visit: Payer: Medicare HMO | Admitting: Cardiology

## 2021-09-08 VITALS — BP 112/70 | HR 52 | Ht 65.0 in | Wt 146.0 lb

## 2021-09-08 DIAGNOSIS — Z95818 Presence of other cardiac implants and grafts: Secondary | ICD-10-CM | POA: Diagnosis not present

## 2021-09-08 DIAGNOSIS — I4819 Other persistent atrial fibrillation: Secondary | ICD-10-CM | POA: Diagnosis not present

## 2021-09-08 DIAGNOSIS — I351 Nonrheumatic aortic (valve) insufficiency: Secondary | ICD-10-CM

## 2021-09-08 DIAGNOSIS — G459 Transient cerebral ischemic attack, unspecified: Secondary | ICD-10-CM | POA: Diagnosis not present

## 2021-09-08 DIAGNOSIS — G4733 Obstructive sleep apnea (adult) (pediatric): Secondary | ICD-10-CM

## 2021-09-08 MED ORDER — CLOPIDOGREL BISULFATE 75 MG PO TABS: 75.0000 mg | ORAL_TABLET | Freq: Every day | ORAL | 3 refills | Status: DC

## 2021-09-08 NOTE — Patient Instructions (Signed)
Medication Instructions:  Your physician has recommended you make the following change in your medication:  1) STOP taking Xarelto on 7/19 2) START taking Plavix (clopidogrel) 75 mg daily on 7/20  *If you need a refill on your cardiac medications before your next appointment, please call your pharmacy*  Lab Work: TODAY: BMET and CBC If you have labs (blood work) drawn today and your tests are completely normal, you will receive your results only by: [place] (if you have MyChart) OR A paper copy in the mail If you have any lab test that is abnormal or we need to change your treatment, we will call you to review the results.  Follow-Up: At [facility], you and your health needs are our priority.  As part of our continuing mission to provide you with exceptional heart care, we have created designated Provider Care Teams.  These Care Teams include your primary Cardiologist (physician) and Advanced Practice Providers (APPs -  Physician Assistants and Nurse Practitioners) who all work together to provide you with the care you need, when you need it.  Follow up as scheduled.   Important Information About [place]

## 2021-09-09 LAB — CBC
Hematocrit: 36.1 % (ref 34.0–46.6)
Hemoglobin: 12.5 g/dL (ref 11.1–15.9)
MCH: 34.7 pg — ABNORMAL HIGH (ref 26.6–33.0)
MCHC: 34.6 g/dL (ref 31.5–35.7)
MCV: 100 fL — ABNORMAL HIGH (ref 79–97)
Platelets: 308 10*3/uL (ref 150–450)
RBC: 3.6 x10E6/uL — ABNORMAL LOW (ref 3.77–5.28)
RDW: 11.3 % — ABNORMAL LOW (ref 11.7–15.4)
WBC: 5.2 10*3/uL (ref 3.4–10.8)

## 2021-09-09 LAB — BASIC METABOLIC PANEL
BUN/Creatinine Ratio: 25 (ref 12–28)
BUN: 16 mg/dL (ref 8–27)
CO2: 24 mmol/L (ref 20–29)
Calcium: 9.9 mg/dL (ref 8.7–10.3)
Chloride: 98 mmol/L (ref 96–106)
Creatinine, Ser: 0.64 mg/dL (ref 0.57–1.00)
Glucose: 96 mg/dL (ref 70–99)
Potassium: 4.6 mmol/L (ref 3.5–5.2)
Sodium: 134 mmol/L (ref 134–144)
eGFR: 93 mL/min/{1.73_m2} (ref >59)

## 2021-09-16 DIAGNOSIS — Z9622 Myringotomy tube(s) status: Secondary | ICD-10-CM | POA: Diagnosis not present

## 2021-09-16 DIAGNOSIS — G4733 Obstructive sleep apnea (adult) (pediatric): Secondary | ICD-10-CM | POA: Diagnosis not present

## 2021-09-16 DIAGNOSIS — H9212 Otorrhea, left ear: Secondary | ICD-10-CM | POA: Diagnosis not present

## 2021-10-01 DIAGNOSIS — E538 Deficiency of other specified B group vitamins: Secondary | ICD-10-CM | POA: Diagnosis not present

## 2021-10-01 DIAGNOSIS — J42 Unspecified chronic bronchitis: Secondary | ICD-10-CM | POA: Diagnosis not present

## 2021-10-01 DIAGNOSIS — D6869 Other thrombophilia: Secondary | ICD-10-CM | POA: Diagnosis not present

## 2021-10-01 DIAGNOSIS — Z95818 Presence of other cardiac implants and grafts: Secondary | ICD-10-CM | POA: Diagnosis not present

## 2021-10-01 DIAGNOSIS — I10 Essential (primary) hypertension: Secondary | ICD-10-CM | POA: Diagnosis not present

## 2021-10-01 DIAGNOSIS — I4891 Unspecified atrial fibrillation: Secondary | ICD-10-CM | POA: Diagnosis not present

## 2021-10-01 DIAGNOSIS — G4733 Obstructive sleep apnea (adult) (pediatric): Secondary | ICD-10-CM | POA: Diagnosis not present

## 2021-10-09 ENCOUNTER — Encounter (HOSPITAL_COMMUNITY): Payer: Self-pay

## 2021-10-09 ENCOUNTER — Ambulatory Visit (HOSPITAL_COMMUNITY): Admit: 2021-10-09 | Payer: Medicare HMO

## 2021-10-10 ENCOUNTER — Ambulatory Visit
Admission: RE | Admit: 2021-10-10 | Discharge: 2021-10-10 | Disposition: A | Discharge status: Home / Self Care | Payer: Medicare HMO | Source: Ambulatory Visit | Attending: Family Medicine | Admitting: Family Medicine

## 2021-10-10 ENCOUNTER — Other Ambulatory Visit: Payer: Self-pay | Admitting: Family Medicine

## 2021-10-10 ENCOUNTER — Telehealth (HOSPITAL_COMMUNITY): Payer: Self-pay | Admitting: Emergency Medicine

## 2021-10-10 DIAGNOSIS — M79675 Pain in left toe(s): Secondary | ICD-10-CM | POA: Diagnosis not present

## 2021-10-10 DIAGNOSIS — M179 Osteoarthritis of knee, unspecified: Secondary | ICD-10-CM | POA: Diagnosis not present

## 2021-10-10 DIAGNOSIS — M79672 Pain in left foot: Secondary | ICD-10-CM

## 2021-10-10 DIAGNOSIS — M7989 Other specified soft tissue disorders: Secondary | ICD-10-CM | POA: Diagnosis not present

## 2021-10-10 NOTE — Telephone Encounter (Signed)
Attempted to call patient regarding upcoming cardiac CT appointment. °Left message on voicemail with name and callback number °Jaxten Brosh RN Navigator Cardiac Imaging °[facility] °[phone] Office °[phone] Cell ° °

## 2021-10-13 ENCOUNTER — Ambulatory Visit (HOSPITAL_COMMUNITY)
Admission: RE | Admit: 2021-10-13 | Discharge: 2021-10-13 | Disposition: A | Discharge status: Home / Self Care | Payer: Medicare HMO | Source: Ambulatory Visit | Attending: Cardiology | Admitting: Cardiology

## 2021-10-13 ENCOUNTER — Encounter: Payer: Self-pay | Admitting: Physician Assistant

## 2021-10-13 ENCOUNTER — Ambulatory Visit: Payer: Medicare HMO | Admitting: Physician Assistant

## 2021-10-13 DIAGNOSIS — Z95818 Presence of other cardiac implants and grafts: Secondary | ICD-10-CM | POA: Insufficient documentation

## 2021-10-13 DIAGNOSIS — I4819 Other persistent atrial fibrillation: Secondary | ICD-10-CM | POA: Diagnosis not present

## 2021-10-13 DIAGNOSIS — M1711 Unilateral primary osteoarthritis, right knee: Secondary | ICD-10-CM | POA: Diagnosis not present

## 2021-10-13 DIAGNOSIS — S92502D Displaced unspecified fracture of left lesser toe(s), subsequent encounter for fracture with routine healing: Secondary | ICD-10-CM

## 2021-10-13 MED ORDER — IOHEXOL 350 MG/ML SOLN
100.0000 mL | Freq: Once | INTRAVENOUS | Status: AC | PRN
  Administered 2021-10-13: 100 mL via INTRAVENOUS

## 2021-10-13 NOTE — Progress Notes (Signed)
Office Visit Note   Patient: Michelle Johnston           Date of Birth: 07-Jan-1951           MRN: [id number] Visit Date: 10/13/2021              Requested by: Mayra Neer, MD [address] [place],  [place] [address] PCP: Mayra Neer, MD   Assessment & Plan: Visit Diagnoses:  1. Closed fracture of phalanx of left fourth toe with routine healing, subsequent encounter   2. Primary osteoarthritis of right knee     Plan: She will buddy tape the third fourth and fifth toes as shown today.  Later it may take up to 8 weeks for this to heal.  No high-impact activities.  In regards to the right knee due to the fact that she is having pain in the knee again does not want to undergo surgery but is found that the cortisone is not lasting very long, recommend repeat supplemental injection in the knee.  We will have her and once this is available.  Follow-Up Instructions: Return for Supplemental injection.   Orders:  No orders of the defined types were placed in this encounter.  No orders of the defined types were placed in this encounter.     Procedures: No procedures performed   Clinical Data: No additional findings.   Subjective: Chief Complaint  Patient presents with   Left Foot - Fracture    HPI Patient returns today due to right knee pain.  She has known osteoarthritis of her right knee.  She has had no new injury to the knee.  She states it swollen again.  Radiographs of her knee showed an moderate lateral joint line narrowing and mild patellofemoral changes with a well-maintained medial joint line.  She states the last cortisone injection gave her very little relief. Unfortunately since she was last seen she injured her left fourth toe about a month ago when she hit it on the end of the table.  She was seen elsewhere and had radiographs obtained of the left fourth toe.  Reviewed these and that shows nondisplaced proximal fourth phalanx fracture midshaft  nonarticular.  Films were dated 10/10/2021. Review of Systems See HPI otherwise negative or noncontributory.  Objective: Vital Signs: There were no vitals taken for this visit.  Physical Exam General well-developed well-nourished female no acute distress.  Mood and affect appropriate Psych: Alert and oriented x3 Ortho Exam Left fourth toe tenderness at the proximal phalanx only.  There is no tenting of the skin.  No skin breakdown.  No gross deformity of the left great toe. Right knee: Good range of motion of the right knee.  No abnormal warmth erythema.  Positive effusion. Specialty Comments:  No specialty comments available.  Imaging:  PMFS History: Patient Active Problem List   Diagnosis Date Noted   Presence of Watchman left atrial appendage closure device 08/07/2021   Atrial fibrillation ([place]) 08/07/2021   OSA (obstructive sleep apnea) 10/29/2020   Persistent atrial fibrillation ([place]) 03/27/2020   Secondary hypercoagulable state ([place]) 03/27/2020   PAD (peripheral artery disease) ([place]) 12/27/2019   Exertional chest pain 12/27/2019   Primary osteoarthritis of right knee 07/12/2019   Paroxysmal atrial fibrillation ([place]) 06/16/2019   Nonrheumatic aortic valve insufficiency 06/16/2019   Nonrheumatic mitral valve regurgitation 06/16/2019   Tachycardia 02/20/2019   Abnormal EKG 02/20/2019   Essential hypertension 02/20/2019   Status post total left knee replacement 02/03/2019  Unilateral primary osteoarthritis, left knee 11/30/2016   Chronic pain of left knee 11/30/2016   Past Medical History:  Diagnosis Date   Anxiety    Arthritis    Bradycardia    Bronchitis    Depression    Dyspnea    Glaucoma    Hypertension    Presence of Watchman left atrial appendage closure device 08/07/2021   Watchman FLX 87m with Dr. LQuentin Ore  Seasonal allergies    Sleep apnea    Wears glasses     Family History  Problem Relation Age of Onset   Hypertension Mother    Hyperlipidemia  Mother    Lymphoma Mother    Lung cancer Father     Past Surgical History:  Procedure Laterality Date   ABDOMINAL HYSTERECTOMY  1990   ATRIAL FIBRILLATION ABLATION N/A 08/12/2020   Procedure: ATRIAL FIBRILLATION ABLATION;  Surgeon: LVickie Epley MD;  [facility];  Service: Cardiovascular;  Laterality: N/A;   BREAST BIOPSY Right    benign   CARDIOVERSION N/A 08/15/2019   Procedure: CARDIOVERSION;  Surgeon: PNigel Mormon MD;  [facility];  Service: Cardiovascular;  Laterality: N/A;   COLONOSCOPY     LEFT ATRIAL APPENDAGE OCCLUSION N/A 08/07/2021   Procedure: LEFT ATRIAL APPENDAGE OCCLUSION;  Surgeon: LVickie Epley MD;  [facility];  Service: Cardiovascular;  Laterality: N/A;   meniscus tear knee     left knee   NASAL SINUS SURGERY  1975   ORIF ANKLE FRACTURE  2009   left   TEE WITHOUT CARDIOVERSION N/A 08/07/2021   Procedure: TRANSESOPHAGEAL ECHOCARDIOGRAM (TEE);  Surgeon: LVickie Epley MD;  [facility];  Service: Cardiovascular;  Laterality: N/A;   TOTAL KNEE ARTHROPLASTY Left 02/03/2019   Procedure: LEFT TOTAL KNEE ARTHROPLASTY;  Surgeon: BMcarthur Rossetti MD;  [facility];  Service: Orthopedics;  Laterality: Left;   TURBINATE REDUCTION Bilateral 06/04/2014   Procedure: BILATERAL TURBINATE REDUCTION;  Surgeon: SLeta Baptist MD;  [facility]  Service: ENT;  Laterality: Bilateral;   TYMPANOSTOMY TUBE PLACEMENT     left   Social History   Occupational History   Not on file  Tobacco Use   Smoking status: Former    Packs/day: 2.00    Years: 30.00    Total pack years: 60.00    Types: Cigarettes    Quit date: 05/28/1998    Years since quitting: 23.3   Smokeless tobacco: Never  Vaping Use   Vaping Use: Never used  Substance and Sexual Activity   Alcohol use: Yes    Alcohol/week: 3.0 - 4.0 standard drinks of alcohol    Types: 2 Glasses of wine, 1 - 2 Cans of beer per  week    Comment: daily-wine daily-1 glass   Drug use: No   Sexual activity: Not on file

## 2021-10-14 ENCOUNTER — Telehealth: Payer: Self-pay

## 2021-10-14 ENCOUNTER — Ambulatory Visit: Payer: Medicare HMO | Admitting: Physician Assistant

## 2021-10-14 NOTE — Telephone Encounter (Signed)
VOB submitted for monovisc, right knee.

## 2021-10-14 NOTE — Telephone Encounter (Signed)
Please get auth for right knee gel injection 

## 2021-10-24 DIAGNOSIS — M5416 Radiculopathy, lumbar region: Secondary | ICD-10-CM | POA: Diagnosis not present

## 2021-11-17 DIAGNOSIS — M5416 Radiculopathy, lumbar region: Secondary | ICD-10-CM | POA: Diagnosis not present

## 2021-11-17 DIAGNOSIS — M47817 Spondylosis without myelopathy or radiculopathy, lumbosacral region: Secondary | ICD-10-CM | POA: Diagnosis not present

## 2021-11-27 DIAGNOSIS — H401332 Pigmentary glaucoma, bilateral, moderate stage: Secondary | ICD-10-CM | POA: Diagnosis not present

## 2021-12-08 ENCOUNTER — Encounter: Payer: Self-pay | Admitting: Physician Assistant

## 2021-12-08 ENCOUNTER — Ambulatory Visit: Payer: Medicare HMO | Admitting: Physician Assistant

## 2021-12-08 ENCOUNTER — Telehealth: Payer: Self-pay

## 2021-12-08 DIAGNOSIS — M1711 Unilateral primary osteoarthritis, right knee: Secondary | ICD-10-CM

## 2021-12-08 MED ORDER — HYALURONAN 88 MG/4ML IX SOSY
88.0000 mg | PREFILLED_SYRINGE | INTRA_ARTICULAR | Status: AC | PRN
  Administered 2021-12-08: 88 mg via INTRA_ARTICULAR

## 2021-12-08 MED ORDER — LIDOCAINE HCL 1 % IJ SOLN
4.0000 mL | INTRAMUSCULAR | Status: AC | PRN
  Administered 2021-12-08: 4 mL

## 2021-12-08 NOTE — Telephone Encounter (Signed)
Right knee monovisc Given today

## 2021-12-08 NOTE — Progress Notes (Signed)
   Procedure Note  Patient: Michelle Johnston             Date of Birth: February 14, 1951           MRN: [id number]             Visit Date: 12/08/2021  HPI: Mrs. Michelle Johnston comes in today for scheduled Monovisc injection right knee.  She has known osteoarthritis of the right knee.  She has had no injury to the knee.  She is taking meloxicam.  She has had cortisone injections nightly.  She has no scheduled surgery on the right knee antibiotics 6 months.  Physical exam: Right knee good range of motion.  No abnormal warmth erythema.  Positive effusion.  Procedures: Visit Diagnoses:  1. Primary osteoarthritis of right knee     Large Joint Inj: R knee on 12/08/2021 1:26 PM Indications: pain Details: 22 G 1.5 in needle, anterolateral approach  Arthrogram: No  Medications: 88 mg Hyaluronan 88 MG/4ML; 4 mL lidocaine 1 % Aspirate: 60 mL yellow Outcome: tolerated well, no immediate complications Procedure, treatment alternatives, risks and benefits explained, specific risks discussed. Consent was given by the patient. Immediately prior to procedure a time out was called to verify the correct patient, procedure, equipment, support staff and site/side marked as required. Patient was prepped and draped in the usual sterile fashion.     Plan: She understands to wait 6 months between supplemental injections.  Questions were encouraged and answered.

## 2021-12-09 ENCOUNTER — Other Ambulatory Visit: Payer: Self-pay

## 2021-12-09 DIAGNOSIS — M1711 Unilateral primary osteoarthritis, right knee: Secondary | ICD-10-CM

## 2021-12-09 NOTE — Telephone Encounter (Signed)
Noted  

## 2021-12-12 DIAGNOSIS — M5416 Radiculopathy, lumbar region: Secondary | ICD-10-CM | POA: Diagnosis not present

## 2021-12-15 DIAGNOSIS — F331 Major depressive disorder, recurrent, moderate: Secondary | ICD-10-CM | POA: Diagnosis not present

## 2021-12-15 DIAGNOSIS — R41840 Attention and concentration deficit: Secondary | ICD-10-CM | POA: Diagnosis not present

## 2021-12-15 DIAGNOSIS — J309 Allergic rhinitis, unspecified: Secondary | ICD-10-CM | POA: Diagnosis not present

## 2022-01-02 ENCOUNTER — Ambulatory Visit: Payer: Medicare HMO | Admitting: Cardiology

## 2022-01-05 DIAGNOSIS — M545 Low back pain, unspecified: Secondary | ICD-10-CM | POA: Diagnosis not present

## 2022-01-05 DIAGNOSIS — M47817 Spondylosis without myelopathy or radiculopathy, lumbosacral region: Secondary | ICD-10-CM | POA: Diagnosis not present

## 2022-01-07 NOTE — Progress Notes (Unsigned)
Cardiology Office Note:    Date:  07/01/2021   ID:  Michelle Johnston, DOB 08-Aug-1950, MRN [id number]  PCP:  Mayra Neer, MD  Cardiologist:  Donato Heinz, MD  Electrophysiologist:  Vickie Epley, MD   Referring MD: Mayra Neer, MD   Chief Complaint  Patient presents with   Atrial Fibrillation     History of Present Illness:    Michelle Johnston is a 71 y.o. female with a hx of paroxysmal atrial fibrillation, moderate MR, moderate AI, tobacco use, OSA who is referred by Dr. Brigitte Pulse for evaluation of atrial fibrillation.  Previously followed with Dr. Virgina Jock in cardiology, last seen 02/07/2020.  She was started on flecainide for A. fib on 12/8, subsequently developed dizziness and blurry vision along with left upper arm numbness.  She was sent to the ED for concern for TIA/CVA.  MRI negative for stroke.  Neurology was consulted, thought that symptoms likely are due to adverse reaction to flecainide as opposed to TIA.  She had successful cardioversion on 08/15/2019 but did not sustain sinus rhythm, was back in A. fib at follow-up visit on 08/24/2019.  Echocardiogram on 03/12/2019 showed EF 55 to 60%, mild LVH, grade 1 diastolic dysfunction, moderate AI, moderate MR.  Lexiscan Myoview on 08/07/2019 showed normal perfusion, EF 43%.  ABIs on 01/02/2020 were mildly reduced in RLE (0.92) and normal in LLE (1.0). repeat echocardiogram 02/06/2020 showed EF 50 to 55%, moderate AI, moderate MR, moderate TR, severe left atrial dilatation, mild right atrial dilatation.  Echocardiogram 06/24/2020 showed EF 55 to 60%, normal RV function, moderate left atrial dilatation, severe right atrial dilatation, mild MR, mild AI, mild dilatation of ascending aorta measuring 39 mm.  Underwent Afib ablation with Dr. Quentin Ore on 08/12/2020.  Appears to be maintaining sinus rhythm.  She was tolerating Xarelto, but having significant issues with arthritis and unable to take NSAIDs due to Xarelto.  She was referred to Dr.  Quentin Ore in underwent Watchman left atrial appendage occlusion on 08/07/2021.  Xarelto was discontinued   Since last clinic visit,  she reports he has been doing well.  Denies any chest pain, dyspnea, lightheadedness, syncope, lower extremity edema, or palpitations.  She is requesting referral to Dr. Quentin Ore for [facility] evaluation, as she has been unable to take her meloxicam to treat her arthritis while on Xarelto.  Diagnosed with OSA, but has been unable to tolerate CPAP.    BP Readings from Last 3 Encounters:  07/01/21 (!) 154/80  11/21/20 130/70  09/23/20 122/60     Past Medical History:  Diagnosis Date   Arthritis    Bradycardia    Bronchitis    Depression    Glaucoma    Hypertension    Seasonal allergies    Wears glasses     Past Surgical History:  Procedure Laterality Date   ABDOMINAL HYSTERECTOMY  1990   ATRIAL FIBRILLATION ABLATION N/A 08/12/2020   Procedure: ATRIAL FIBRILLATION ABLATION;  Surgeon: Vickie Epley, MD;  Location: [facility];  Service: Cardiovascular;  Laterality: N/A;   BREAST BIOPSY Right    benign   CARDIOVERSION N/A 08/15/2019   Procedure: CARDIOVERSION;  Surgeon: Nigel Mormon, MD;  Location: [facility];  Service: Cardiovascular;  Laterality: N/A;   COLONOSCOPY     meniscus tear knee     left knee   NASAL SINUS SURGERY  1975   ORIF ANKLE FRACTURE  2009   left   TOTAL KNEE ARTHROPLASTY Left 02/03/2019   Procedure:  LEFT TOTAL KNEE ARTHROPLASTY;  Surgeon: Mcarthur Rossetti, MD;  Location: [facility];  Service: Orthopedics;  Laterality: Left;   TURBINATE REDUCTION Bilateral 06/04/2014   Procedure: BILATERAL TURBINATE REDUCTION;  Surgeon: Leta Baptist, MD;  Location: [facility];  Service: ENT;  Laterality: Bilateral;   TYMPANOSTOMY TUBE PLACEMENT     left    Current Medications: Current Meds  Medication Sig   acetaminophen (TYLENOL) 500 MG tablet Take 1,000 mg by mouth every 6 (six) hours as needed. pain    amLODipine (NORVASC) 5 MG tablet Take 5 mg by mouth at bedtime.    b complex vitamins tablet Take 1 tablet by mouth daily.   buPROPion (WELLBUTRIN XL) 300 MG 24 hr tablet Take 150 mg by mouth daily.   calcium carbonate (OS-CAL - DOSED IN MG OF ELEMENTAL CALCIUM) 1250 (500 Ca) MG tablet Take 1 tablet by mouth daily with breakfast.   carvedilol (COREG) 6.25 MG tablet Take 1 tablet (6.25 mg total) by mouth 2 (two) times daily.   cetirizine (ZYRTEC) 10 MG tablet Take 10 mg by mouth at bedtime.   Cholecalciferol (VITAMIN D) 50 MCG (2000 UT) tablet Take 2,000 Units by mouth daily.   FLUoxetine (PROZAC) 40 MG capsule Take 40 mg by mouth daily.   Fluticasone-Salmeterol (ADVAIR) 250-50 MCG/DOSE AEPB Inhale 1 puff into the lungs 2 (two) times daily as needed (asthma).   gabapentin (NEURONTIN) 300 MG capsule Take 300 mg by mouth 2 (two) times daily. Takes it once daily or twice daily   guaiFENesin (MUCINEX) 600 MG 12 hr tablet Take 600 mg by mouth daily as needed for cough or to loosen phlegm.   latanoprost (XALATAN) 0.005 % ophthalmic solution Place 1 drop into both eyes every morning.    meloxicam (MOBIC) 15 MG tablet Take 15 mg by mouth daily as needed for pain.   meloxicam (MOBIC) 15 MG tablet Take 1 tablet by mouth daily.   montelukast (SINGULAIR) 10 MG tablet Take 10 mg by mouth at bedtime.   Multiple Vitamins-Minerals (MULTIVITAMIN WITH MINERALS) tablet Take 1 tablet by mouth daily.   Omega-3 Fatty Acids (FISH OIL) 1000 MG CAPS Take 1,000 mg by mouth daily.   omeprazole (PRILOSEC) 20 MG capsule Take 20 mg by mouth daily as needed (acid reflux).   rivaroxaban (XARELTO) 20 MG TABS tablet Take 1 tablet (20 mg total) by mouth daily with supper.   rosuvastatin (CRESTOR) 10 MG tablet TAKE 1 TABLET (10 MG TOTAL) BY MOUTH DAILY.   vitamin C (ASCORBIC ACID) 500 MG tablet Take 500 mg by mouth daily.   [DISCONTINUED] metoprolol tartrate (LOPRESSOR) 25 MG tablet TAKE 1 TABLET (25 MG TOTAL) BY MOUTH 2 (TWO)  TIMES DAILY.     Allergies:   Ciprofibrate, Flecainide, Other, Ceclor [cefaclor], Ciprofloxacin, and Flagyl [metronidazole]   Social History   Socioeconomic History   Marital status: Married    Spouse name: Not on file   Number of children: 0   Years of education: Not on file   Highest education level: Not on file  Occupational History   Not on file  Tobacco Use   Smoking status: Former    Packs/day: 2.00    Years: 30.00    Pack years: 60.00    Types: Cigarettes    Quit date: 05/28/1998    Years since quitting: 23.1   Smokeless tobacco: Never  Vaping Use   Vaping Use: Never used  Substance and Sexual Activity   Alcohol use: Yes  Alcohol/week: 3.0 - 4.0 standard drinks    Types: 2 Glasses of wine, 1 - 2 Cans of beer per week    Comment: daily-wine daily-1 glass   Drug use: No   Sexual activity: Not on file  Other Topics Concern   Not on file  Social History Narrative   Not on file   Social Determinants of Health   Financial Resource Strain: Not on file  Food Insecurity: Not on file  Transportation Needs: Not on file  Physical Activity: Not on file  Stress: Not on file  Social Connections: Not on file     Family History: The patient's family history includes Hyperlipidemia in her mother; Hypertension in her mother; Lung cancer in her father; Lymphoma in her mother.  ROS:   Please see the history of present illness.     All other systems reviewed and are negative.  EKGs/Labs/Other Studies Reviewed:    The following studies were reviewed today:  Echo 04/22: IMPRESSIONS    1. Left ventricular ejection fraction, by estimation, is 55 to 60%. The  left ventricle has normal function. The left ventricle has no regional  wall motion abnormalities. There is mild left ventricular hypertrophy.  Left ventricular diastolic parameters  are indeterminate.   2. Right ventricular systolic function is normal. The right ventricular  size is normal. There is normal  pulmonary artery systolic pressure. The  estimated right ventricular systolic pressure is 03.4 mmHg.   3. Left atrial size was moderately dilated.   4. Right atrial size was severely dilated.   5. The mitral valve is normal in structure. Mild mitral valve  regurgitation.   6. The aortic valve is tricuspid. Aortic valve regurgitation is mild. No  aortic stenosis is present.   7. Aortic dilatation noted. There is mild dilatation of the ascending  aorta, measuring 39 mm.   8. The inferior vena cava is normal in size with greater than 50%  respiratory variability, suggesting right atrial pressure of 3 mmHg.   EKG:   07/01/21: NSR, rate 55 01/22: atrial fibrillation, rate 85, incomplete right bundle branch block  Recent Labs: 07/31/2020: BUN 16; Creatinine, Ser 0.83; Hemoglobin 12.6; Platelets 254; Potassium 4.5; Sodium 136  Recent Lipid Panel    Component Value Date/Time   CHOL 214 (H) 09/23/2020 1213   TRIG 109 09/23/2020 1213   HDL 104 09/23/2020 1213   CHOLHDL 2.1 09/23/2020 1213   LDLCALC 92 09/23/2020 1213    Physical Exam:    VS:  BP (!) 154/80 (BP Location: Left Arm, Patient Position: Sitting, Cuff Size: Normal)   Pulse (!) 55   Ht '5\' 5"'$  (1.651 m)   Wt 153 lb 12.8 oz (69.8 kg)   SpO2 96%   BMI 25.59 kg/m     Wt Readings from Last 3 Encounters:  07/01/21 153 lb 12.8 oz (69.8 kg)  11/21/20 157 lb (71.2 kg)  10/29/20 155 lb (70.3 kg)     GEN:  in no acute distress HEENT: Normal NECK: No JVD; No carotid bruits LYMPHATICS: No lymphadenopathy CARDIAC: Normal rate, RRR, no murmurs RESPIRATORY:  Clear to auscultation without rales, wheezing or rhonchi  ABDOMEN: Soft, non-tender, non-distended MUSCULOSKELETAL:  No edema; No deformity  SKIN: Warm and dry NEUROLOGIC:  Alert and oriented x 3 PSYCHIATRIC:  Normal affect  ASSESSMENT:    1. Persistent atrial fibrillation ([place])   2. Obstructive sleep apnea (adult) (pediatric)   3. Mitral valve insufficiency, unspecified  etiology   4. Aortic valve insufficiency,  etiology of cardiac valve disease unspecified   5. PAD (peripheral artery disease) ([place])   6. Hyperlipidemia, unspecified hyperlipidemia type      PLAN:    Atrial fibrillation: Persistent. CHA2DS2-VASc score 3 (hypertension, age, female).  Successful cardioversion in June 2021 but went back into A. fib.  Was tried on flecainide but had adverse reaction.  Underwent ablation with Dr. Quentin Ore on 08/12/2020.  Appears to be maintaining sinus rhythm.  She was tolerating Xarelto, but having significant issues with arthritis and unable to take NSAIDs due to Xarelto.  She was referred to Dr. Quentin Ore in underwent Watchman left atrial appendage occlusion on 08/07/2021.  Xarelto was discontinued -Continue carvedilol 12.5 mg twice daily for better BP control  Mtral regurgitation: Moderate by report on echo 02/06/2020, repeat echo on 06/24/2020 showed mild MR  Aortic regurgitation: Moderate by report on echo 02/06/2020 repeat echo on 06/24/2020 showed mild AI  Hypertension: On amlodipine 5 mg daily and carvedilol 12.5 mg twice daily  PAD: Mild PAD RLE (ABI 0.92), normal in LLE (ABI 1.0 ).   -Continue statin -***Recommend starting aspirin with discontinuing Xarelto  Hyperlipidemia: On rosuvastatin 10 mg daily, LDL 98 on 12/27/2020.  Goal LDL less than 70 given her PAD, will increase rosuvastatin to 20 mg daily  OSA: Started on CPAP but has been unable to tolerate mask.  BMI 26.  Referred to Dr. Redmond Baseman in ENT to evaluate for Inspire   RTC in 6 months  Medication Adjustments/Labs and Tests Ordered: Current medicines are reviewed at length with the patient today.  Concerns regarding medicines are outlined above.  Orders Placed This Encounter  Procedures   Ambulatory referral to ENT   EKG 12-Lead    Meds ordered this encounter  Medications   carvedilol (COREG) 6.25 MG tablet    Sig: Take 1 tablet (6.25 mg total) by mouth 2 (two) times daily.    Dispense:  180  tablet    Refill:  3    STOP metoprolol     Patient Instructions  Medication Instructions:  STOP metoprolol START carvedilol (Coreg) 6.25 mg two times daily  Please check your blood pressure at home twice daily, write it down.  Call the office or send message via Mychart with the readings in 1-2 weeks for Dr. Gardiner Rhyme to review.   *If you need a refill on your cardiac medications before your next appointment, please call your pharmacy*  Follow-Up: At [facility], you and your health needs are our priority.  As part of our continuing mission to provide you with exceptional heart care, we have created designated Provider Care Teams.  These Care Teams include your primary Cardiologist (physician) and Advanced Practice Providers (APPs -  Physician Assistants and Nurse Practitioners) who all work together to provide you with the care you need, when you need it.  We recommend signing up for the patient portal called "MyChart".  Sign up information is provided on this After Visit Summary.  MyChart is used to connect with patients for Virtual Visits (Telemedicine).  Patients are able to view lab/test results, encounter notes, upcoming appointments, etc.  Non-urgent messages can be sent to your provider as well.   To learn more about what you can do with MyChart, go to [email or web address].    Your next appointment:   6 month(s)  The format for your next appointment:   In Person  Provider:   Dr. Gardiner Rhyme  Other Instructions You have been referred to: ENT to discuss inspire for sleep apnea  Important Information About Sugar           Signed, Donato Heinz, MD  07/01/2021 8:49 AM    [facility]

## 2022-01-08 ENCOUNTER — Encounter: Payer: Self-pay | Admitting: Cardiology

## 2022-01-08 ENCOUNTER — Ambulatory Visit: Payer: Medicare HMO | Attending: Cardiology | Admitting: Cardiology

## 2022-01-08 VITALS — BP 116/74 | HR 46 | Ht 65.0 in | Wt 153.2 lb

## 2022-01-08 DIAGNOSIS — I4819 Other persistent atrial fibrillation: Secondary | ICD-10-CM

## 2022-01-08 DIAGNOSIS — E785 Hyperlipidemia, unspecified: Secondary | ICD-10-CM | POA: Diagnosis not present

## 2022-01-08 DIAGNOSIS — I739 Peripheral vascular disease, unspecified: Secondary | ICD-10-CM | POA: Diagnosis not present

## 2022-01-08 DIAGNOSIS — I10 Essential (primary) hypertension: Secondary | ICD-10-CM | POA: Diagnosis not present

## 2022-01-08 DIAGNOSIS — I340 Nonrheumatic mitral (valve) insufficiency: Secondary | ICD-10-CM | POA: Diagnosis not present

## 2022-01-08 DIAGNOSIS — I351 Nonrheumatic aortic (valve) insufficiency: Secondary | ICD-10-CM | POA: Diagnosis not present

## 2022-01-08 MED ORDER — ASPIRIN 81 MG PO TBEC: 81.0000 mg | DELAYED_RELEASE_TABLET | Freq: Every day | ORAL | 3 refills | Status: DC

## 2022-01-08 MED ORDER — CARVEDILOL 6.25 MG PO TABS: 6.2500 mg | ORAL_TABLET | Freq: Two times a day (BID) | ORAL | 3 refills | Status: DC

## 2022-01-08 NOTE — Patient Instructions (Signed)
Medication Instructions:  START aspirin 81 mg daily DECREASE carvedilol (Coreg) 6.25 mg two times daily  Please check your blood pressure at home daily, write it down.  Call the office or send message via Mychart with the readings in 2 weeks for Dr. Gardiner Rhyme to review.   *If you need a refill on your cardiac medications before your next appointment, please call your pharmacy*   Lab Work: Lipid today  If you have labs (blood work) drawn today and your tests are completely normal, you will receive your results only by: [place] (if you have MyChart) OR A paper copy in the mail If you have any lab test that is abnormal or we need to change your treatment, we will call you to review the results.   Testing/Procedures: Your physician has requested that you have an ankle brachial index (ABI). During this test an ultrasound and blood pressure cuff are used to evaluate the arteries that supply the arms and legs with blood. Allow thirty minutes for this exam. There are no restrictions or special instructions.   Follow-Up: At [facility], you and your health needs are our priority.  As part of our continuing mission to provide you with exceptional heart care, we have created designated Provider Care Teams.  These Care Teams include your primary Cardiologist (physician) and Advanced Practice Providers (APPs -  Physician Assistants and Nurse Practitioners) who all work together to provide you with the care you need, when you need it.  We recommend signing up for the patient portal called "MyChart".  Sign up information is provided on this After Visit Summary.  MyChart is used to connect with patients for Virtual Visits (Telemedicine).  Patients are able to view lab/test results, encounter notes, upcoming appointments, etc.  Non-urgent messages can be sent to your provider as well.   To learn more about what you can do with MyChart, go to [email or web address].    Your next  appointment:   6 month(s)  The format for your next appointment:   In Person  Provider:   Donato Heinz, MD

## 2022-01-09 ENCOUNTER — Other Ambulatory Visit: Payer: Self-pay | Admitting: *Deleted

## 2022-01-09 LAB — LIPID PANEL
Chol/HDL Ratio: 2 ratio (ref 0.0–4.4)
Cholesterol, Total: 179 mg/dL (ref 100–199)
HDL: 90 mg/dL (ref >39)
LDL Chol Calc (NIH): 77 mg/dL (ref 0–99)
Triglycerides: 63 mg/dL (ref 0–149)
VLDL Cholesterol Cal: 12 mg/dL (ref 5–40)

## 2022-01-09 MED ORDER — EZETIMIBE 10 MG PO TABS: 10.0000 mg | ORAL_TABLET | Freq: Every day | ORAL | 3 refills | Status: DC

## 2022-01-14 ENCOUNTER — Other Ambulatory Visit: Payer: Self-pay | Admitting: *Deleted

## 2022-01-14 NOTE — Addendum Note (Signed)
Addended by: Deanna Artis A on: 01/14/2022 09:20 AM   Modules accepted: Orders

## 2022-01-15 DIAGNOSIS — I10 Essential (primary) hypertension: Secondary | ICD-10-CM | POA: Diagnosis not present

## 2022-01-15 DIAGNOSIS — J42 Unspecified chronic bronchitis: Secondary | ICD-10-CM | POA: Diagnosis not present

## 2022-01-15 DIAGNOSIS — E538 Deficiency of other specified B group vitamins: Secondary | ICD-10-CM | POA: Diagnosis not present

## 2022-01-15 DIAGNOSIS — Z0000 Encounter for general adult medical examination without abnormal findings: Secondary | ICD-10-CM | POA: Diagnosis not present

## 2022-01-15 DIAGNOSIS — Z23 Encounter for immunization: Secondary | ICD-10-CM | POA: Diagnosis not present

## 2022-01-15 DIAGNOSIS — F331 Major depressive disorder, recurrent, moderate: Secondary | ICD-10-CM | POA: Diagnosis not present

## 2022-01-15 DIAGNOSIS — I4891 Unspecified atrial fibrillation: Secondary | ICD-10-CM | POA: Diagnosis not present

## 2022-01-15 DIAGNOSIS — D692 Other nonthrombocytopenic purpura: Secondary | ICD-10-CM | POA: Diagnosis not present

## 2022-01-15 DIAGNOSIS — K219 Gastro-esophageal reflux disease without esophagitis: Secondary | ICD-10-CM | POA: Diagnosis not present

## 2022-01-15 DIAGNOSIS — E7800 Pure hypercholesterolemia, unspecified: Secondary | ICD-10-CM | POA: Diagnosis not present

## 2022-01-18 DIAGNOSIS — Z1211 Encounter for screening for malignant neoplasm of colon: Secondary | ICD-10-CM | POA: Diagnosis not present

## 2022-01-21 DIAGNOSIS — M47817 Spondylosis without myelopathy or radiculopathy, lumbosacral region: Secondary | ICD-10-CM | POA: Diagnosis not present

## 2022-02-02 ENCOUNTER — Ambulatory Visit (HOSPITAL_COMMUNITY): Payer: Medicare HMO

## 2022-02-05 ENCOUNTER — Telehealth: Payer: Self-pay | Admitting: Cardiology

## 2022-02-05 NOTE — Telephone Encounter (Signed)
  [facility]   MULTIDISCIPLINARY HEART TEAM  Attempted to call the patient for 6 month Watchman follow up. Plan is to stop Plavix 02/06/22 given 6 months duration of therapy. Patient did not answer with no availability on voicemail to leave message. Will try again tomorrow.   Kathyrn Drown NP-C Structural Heart Team  Pager: [phone] Phone: [phone]

## 2022-02-06 ENCOUNTER — Telehealth: Payer: Self-pay | Admitting: Cardiology

## 2022-02-06 NOTE — Telephone Encounter (Signed)
Attempted to call the patient once again for medication changes 6 months post LAAO closure with Watchman. Left voice message to return call to confirm changes.   Kathyrn Drown NP-C Structural Heart Team  Pager: [phone] Phone: [phone]

## 2022-02-09 ENCOUNTER — Ambulatory Visit (HOSPITAL_COMMUNITY)
Admission: RE | Admit: 2022-02-09 | Discharge: 2022-02-09 | Disposition: A | Discharge status: Home / Self Care | Payer: Medicare HMO | Source: Ambulatory Visit | Attending: Cardiology | Admitting: Cardiology

## 2022-02-09 DIAGNOSIS — I739 Peripheral vascular disease, unspecified: Secondary | ICD-10-CM | POA: Diagnosis not present

## 2022-02-17 NOTE — Telephone Encounter (Signed)
Spoke with the patient, who confirmed she stopped taking Plavix. Medication list updated.

## 2022-03-03 DIAGNOSIS — M47817 Spondylosis without myelopathy or radiculopathy, lumbosacral region: Secondary | ICD-10-CM | POA: Diagnosis not present

## 2022-04-09 ENCOUNTER — Encounter (HOSPITAL_COMMUNITY): Payer: Self-pay | Admitting: *Deleted

## 2022-04-20 DIAGNOSIS — E538 Deficiency of other specified B group vitamins: Secondary | ICD-10-CM | POA: Diagnosis not present

## 2022-04-20 DIAGNOSIS — E7800 Pure hypercholesterolemia, unspecified: Secondary | ICD-10-CM | POA: Diagnosis not present

## 2022-04-20 DIAGNOSIS — Z95818 Presence of other cardiac implants and grafts: Secondary | ICD-10-CM | POA: Diagnosis not present

## 2022-04-20 DIAGNOSIS — G4733 Obstructive sleep apnea (adult) (pediatric): Secondary | ICD-10-CM | POA: Diagnosis not present

## 2022-04-20 DIAGNOSIS — I4891 Unspecified atrial fibrillation: Secondary | ICD-10-CM | POA: Diagnosis not present

## 2022-04-20 DIAGNOSIS — J42 Unspecified chronic bronchitis: Secondary | ICD-10-CM | POA: Diagnosis not present

## 2022-04-20 DIAGNOSIS — I10 Essential (primary) hypertension: Secondary | ICD-10-CM | POA: Diagnosis not present

## 2022-04-20 DIAGNOSIS — F3342 Major depressive disorder, recurrent, in full remission: Secondary | ICD-10-CM | POA: Diagnosis not present

## 2022-04-20 DIAGNOSIS — I739 Peripheral vascular disease, unspecified: Secondary | ICD-10-CM | POA: Diagnosis not present

## 2022-05-07 ENCOUNTER — Encounter: Payer: Self-pay | Admitting: Radiology

## 2022-05-07 DIAGNOSIS — M545 Low back pain, unspecified: Secondary | ICD-10-CM | POA: Diagnosis not present

## 2022-05-18 DIAGNOSIS — M47817 Spondylosis without myelopathy or radiculopathy, lumbosacral region: Secondary | ICD-10-CM | POA: Diagnosis not present

## 2022-06-15 DIAGNOSIS — M7061 Trochanteric bursitis, right hip: Secondary | ICD-10-CM | POA: Diagnosis not present

## 2022-06-15 DIAGNOSIS — M47817 Spondylosis without myelopathy or radiculopathy, lumbosacral region: Secondary | ICD-10-CM | POA: Diagnosis not present

## 2022-06-29 ENCOUNTER — Other Ambulatory Visit: Payer: Self-pay | Admitting: Cardiology

## 2022-07-01 DIAGNOSIS — M7061 Trochanteric bursitis, right hip: Secondary | ICD-10-CM | POA: Diagnosis not present

## 2022-07-06 ENCOUNTER — Ambulatory Visit: Payer: Medicare HMO | Admitting: Physician Assistant

## 2022-07-06 ENCOUNTER — Telehealth: Payer: Self-pay

## 2022-07-06 ENCOUNTER — Encounter: Payer: Self-pay | Admitting: Physician Assistant

## 2022-07-06 DIAGNOSIS — M1711 Unilateral primary osteoarthritis, right knee: Secondary | ICD-10-CM

## 2022-07-06 MED ORDER — LIDOCAINE HCL 1 % IJ SOLN
3.0000 mL | INTRAMUSCULAR | Status: AC | PRN
  Administered 2022-07-06: 3 mL

## 2022-07-06 MED ORDER — METHYLPREDNISOLONE ACETATE 40 MG/ML IJ SUSP
40.0000 mg | INTRAMUSCULAR | Status: AC | PRN
  Administered 2022-07-06: 40 mg via INTRA_ARTICULAR

## 2022-07-06 NOTE — Telephone Encounter (Signed)
VOB submitted for Monovisc, right knee  

## 2022-07-06 NOTE — Progress Notes (Signed)
   Procedure Note  Patient: Michelle Johnston             Date of Birth: 12/25/1950           MRN: [id number]             Visit Date: 07/06/2022  HPI: [name] returns today due to right knee pain.  She states that she has fallen on the knee and is asking for this to be aspirated and cortisone injection to be given.  States that the gel injection helped with her right knee pain greatly until about 2 months ago.  She has had no new injuries.  She is nondiabetic.  Denies any fevers chills.  Has known osteoarthritis right knee.  Review of systems: See HPI otherwise negative  Physical exam: General: Well-developed well-nourished female ambulates with a slight antalgic gait no assistive device. Right knee: No abnormal warmth erythema.  Positive effusion.  Tenderness inferior pole of patella.  Full range of motion of the knee with patellofemoral crepitus.  Valgus deformity.  Procedures: Visit Diagnoses:  1. Primary osteoarthritis of right knee     Large Joint Inj: R knee on 07/06/2022 9:48 AM Indications: pain Details: 22 G 1.5 in needle, superolateral approach  Arthrogram: No  Medications: 3 mL lidocaine 1 %; 40 mg methylPREDNISolone acetate 40 MG/ML Aspirate: 72 mL yellow Outcome: tolerated well, no immediate complications Procedure, treatment alternatives, risks and benefits explained, specific risks discussed. Consent was given by the patient. Immediately prior to procedure a time out was called to verify the correct patient, procedure, equipment, support staff and site/side marked as required. Patient was prepped and draped in the usual sterile fashion.      Plan: Will work on attaining viscosupplementation injection for right knee have her back once this is available.  Questions were encouraged and answered at length today.  She tolerated the aspiration injection well.

## 2022-07-06 NOTE — Telephone Encounter (Signed)
Please get auth for right knee gel injection-gil pt 

## 2022-07-10 ENCOUNTER — Ambulatory Visit: Payer: Medicare HMO | Admitting: Cardiology

## 2022-07-20 ENCOUNTER — Other Ambulatory Visit: Payer: Self-pay | Admitting: Family Medicine

## 2022-07-20 ENCOUNTER — Ambulatory Visit
Admission: RE | Admit: 2022-07-20 | Discharge: 2022-07-20 | Disposition: A | Discharge status: Home / Self Care | Payer: Medicare HMO | Source: Ambulatory Visit | Attending: Family Medicine | Admitting: Family Medicine

## 2022-07-20 DIAGNOSIS — Z95818 Presence of other cardiac implants and grafts: Secondary | ICD-10-CM | POA: Diagnosis not present

## 2022-07-20 DIAGNOSIS — M79672 Pain in left foot: Secondary | ICD-10-CM

## 2022-07-20 DIAGNOSIS — I4891 Unspecified atrial fibrillation: Secondary | ICD-10-CM | POA: Diagnosis not present

## 2022-07-20 DIAGNOSIS — I10 Essential (primary) hypertension: Secondary | ICD-10-CM | POA: Diagnosis not present

## 2022-07-20 DIAGNOSIS — S99922A Unspecified injury of left foot, initial encounter: Secondary | ICD-10-CM | POA: Diagnosis not present

## 2022-07-20 DIAGNOSIS — E538 Deficiency of other specified B group vitamins: Secondary | ICD-10-CM | POA: Diagnosis not present

## 2022-07-20 DIAGNOSIS — E7800 Pure hypercholesterolemia, unspecified: Secondary | ICD-10-CM | POA: Diagnosis not present

## 2022-07-20 DIAGNOSIS — J209 Acute bronchitis, unspecified: Secondary | ICD-10-CM | POA: Diagnosis not present

## 2022-07-20 DIAGNOSIS — F3342 Major depressive disorder, recurrent, in full remission: Secondary | ICD-10-CM | POA: Diagnosis not present

## 2022-07-20 DIAGNOSIS — I739 Peripheral vascular disease, unspecified: Secondary | ICD-10-CM | POA: Diagnosis not present

## 2022-07-20 LAB — LAB REPORT - SCANNED: EGFR: 94

## 2022-07-21 ENCOUNTER — Encounter: Payer: Self-pay | Admitting: Cardiology

## 2022-07-21 ENCOUNTER — Ambulatory Visit: Payer: Medicare HMO | Attending: Cardiology

## 2022-07-21 ENCOUNTER — Ambulatory Visit: Payer: Medicare HMO | Attending: Cardiology | Admitting: Cardiology

## 2022-07-21 VITALS — BP 116/76 | HR 94 | Ht 65.0 in | Wt 152.0 lb

## 2022-07-21 DIAGNOSIS — I4819 Other persistent atrial fibrillation: Secondary | ICD-10-CM

## 2022-07-21 DIAGNOSIS — I10 Essential (primary) hypertension: Secondary | ICD-10-CM

## 2022-07-21 DIAGNOSIS — E785 Hyperlipidemia, unspecified: Secondary | ICD-10-CM | POA: Diagnosis not present

## 2022-07-21 DIAGNOSIS — I351 Nonrheumatic aortic (valve) insufficiency: Secondary | ICD-10-CM

## 2022-07-21 DIAGNOSIS — I77819 Aortic ectasia, unspecified site: Secondary | ICD-10-CM

## 2022-07-21 DIAGNOSIS — I340 Nonrheumatic mitral (valve) insufficiency: Secondary | ICD-10-CM | POA: Diagnosis not present

## 2022-07-21 NOTE — Patient Instructions (Signed)
Medication Instructions:  Your physician recommends that you continue on your current medications as directed. Please refer to the Current Medication list given to you today.   *If you need a refill on your cardiac medications before your next appointment, please call your pharmacy*  Testing/Procedures: Your physician has requested that you have an echocardiogram in 12 MONTHS. Echocardiography is a painless test that uses sound waves to create images of your heart. It provides your doctor with information about the size and shape of your heart and how well your heart's chambers and valves are working. This procedure takes approximately one hour. There are no restrictions for this procedure. Please do NOT wear cologne, perfume, aftershave, or lotions (deodorant is allowed). Please arrive 15 minutes prior to your appointment time.   ZIO XT- Long Term Monitor Instructions   Your physician has requested you wear a ZIO patch monitor for _7_ days.  This is a single patch monitor.   [employer] supplies one patch monitor per enrollment. Additional stickers are not available. Please do not apply patch if you will be having a Nuclear Stress Test, Echocardiogram, Cardiac CT, MRI, or Chest Xray during the period you would be wearing the monitor. The patch cannot be worn during these tests. You cannot remove and re-apply the ZIO XT patch monitor.  Your ZIO patch monitor will be sent Fed Ex from [employer] directly to your home address. It may take 3-5 days to receive your monitor after you have been enrolled.  Once you have received your monitor, please review the enclosed instructions. Your monitor has already been registered assigning a specific monitor serial # to you.  Billing and Patient Assistance Program Information   We have supplied [employer] with any of your insurance information on file for billing purposes. [employer] offers a sliding scale Patient Assistance Program for patients that do not  have insurance, or whose insurance does not completely cover the cost of the ZIO monitor.   You must apply for the Patient Assistance Program to qualify for this discounted rate.     To apply, please call [employer] at [phone], select option 4, then select option 2, and ask to apply for Patient Assistance Program.  Meredeth Ide will ask your household income, and how many people are in your household.  They will quote your out-of-pocket cost based on that information.  [employer] will also be able to set up a 63-month, interest-free payment plan if needed.  Applying the monitor   Shave hair from upper left chest.  Hold abrader disc by orange tab. Rub abrader in 40 strokes over the upper left chest as indicated in your monitor instructions.  Clean area with 4 enclosed alcohol pads. Let dry.  Apply patch as indicated in monitor instructions. Patch will be placed under collarbone on left side of chest with arrow pointing upward.  Rub patch adhesive wings for 2 minutes. Remove white label marked "1". Remove the white label marked "2". Rub patch adhesive wings for 2 additional minutes.  While looking in a mirror, press and release button in center of patch. A small green light will flash 3-4 times. This will be your only indicator that the monitor has been turned on. ?  Do not shower for the first 24 hours. You may shower after the first 24 hours.  Press the button if you feel a symptom. You will hear a small click. Record Date, Time and Symptom in the Patient Logbook.  When you are ready to remove the patch,  follow instructions on the last 2 pages of the Patient Logbook. Stick patch monitor onto the last page of Patient Logbook.  Place Patient Logbook in the blue and white box.  Use locking tab on box and tape box closed securely.  The blue and white box has prepaid postage on it. Please place it in the mailbox as soon as possible. Your physician should have your test results approximately 7 days after the  monitor has been mailed back to [facility].  Call [facility] Customer Care at [phone] if you have questions regarding your ZIO XT patch monitor. Call them immediately if you see an orange light blinking on your monitor.  If your monitor falls off in less than 4 days, contact our Monitor department at [phone]. ?If your monitor becomes loose or falls off after 4 days call [employer] at [phone] for suggestions on securing your monitor.?  Follow-Up: At [facility], you and your health needs are our priority.  As part of our continuing mission to provide you with exceptional heart care, we have created designated Provider Care Teams.  These Care Teams include your primary Cardiologist (physician) and Advanced Practice Providers (APPs -  Physician Assistants and Nurse Practitioners) who all work together to provide you with the care you need, when you need it.  We recommend signing up for the patient portal called "MyChart".  Sign up information is provided on this After Visit Summary.  MyChart is used to connect with patients for Virtual Visits (Telemedicine).  Patients are able to view lab/test results, encounter notes, upcoming appointments, etc.  Non-urgent messages can be sent to your provider as well.   To learn more about what you can do with MyChart, go to [email or web address].    Your next appointment:   6 month(s)  Provider:   Little Ishikawa, MD

## 2022-07-21 NOTE — Progress Notes (Signed)
Cardiology Office Note:    Date:  07/21/2022   ID:  Michelle, Johnston [birth year], MRN [id number]  PCP:  Lupita Raider, MD  Cardiologist:  Little Ishikawa, MD  Electrophysiologist:  Lanier Prude, MD   Referring MD: Lupita Raider, MD   Chief Complaint  Patient presents with   Atrial Fibrillation    History of Present Illness:    Michelle Johnston is a 72 y.o. female with a hx of paroxysmal atrial fibrillation, moderate MR, moderate AI, tobacco use, OSA who is referred by Dr. Clelia Croft for evaluation of atrial fibrillation.  Previously followed with Dr. Rosemary Holms in cardiology, last seen 02/07/2020.  She was started on flecainide for A. fib on 12/8, subsequently developed dizziness and blurry vision along with left upper arm numbness.  She was sent to the ED for concern for TIA/CVA.  MRI negative for stroke.  Neurology was consulted, thought that symptoms likely are due to adverse reaction to flecainide as opposed to TIA.  She had successful cardioversion on 08/15/2019 but did not sustain sinus rhythm, was back in A. fib at follow-up visit on 08/24/2019.  Echocardiogram on 03/12/2019 showed EF 55 to 60%, mild LVH, grade 1 diastolic dysfunction, moderate AI, moderate MR.  Lexiscan Myoview on 08/07/2019 showed normal perfusion, EF 43%.  ABIs on 01/02/2020 were mildly reduced in RLE (0.92) and normal in LLE (1.0). repeat echocardiogram 02/06/2020 showed EF 50 to 55%, moderate AI, moderate MR, moderate TR, severe left atrial dilatation, mild right atrial dilatation.  Echocardiogram 06/24/2020 showed EF 55 to 60%, normal RV function, moderate left atrial dilatation, severe right atrial dilatation, mild MR, mild AI, mild dilatation of ascending aorta measuring 39 mm.  Underwent Afib ablation with Dr. Lalla Brothers on 08/12/2020.  Appears to be maintaining sinus rhythm.  She was tolerating Xarelto, but having significant issues with arthritis and unable to take NSAIDs due to Xarelto.  She was referred to Dr.  Lalla Brothers and underwent Watchman left atrial appendage occlusion on 08/07/2021.  Xarelto was discontinued.  Cardiac MRI on 07/17/2021 showed normal biventricular function, no LGE, mild to moderate AI (regurgitant fraction 14%), mild mitral annular disjunction with trivial MR, dilated ascending aorta measuring 41 mm, dilated main pulmonary artery measuring 31 mm.  Since last clinic visit, she reports she is doing well.  Denies any chest pain, dyspnea,  lower extremity edema, or palpitations.  Reports some lightheadedness but denies any syncope.  She has been doing water aerobics twice per week for 1 hour   BP Readings from Last 3 Encounters:  07/21/22 116/76  01/08/22 116/74  10/13/21 (!) 150/67     Past Medical History:  Diagnosis Date   Anxiety    Arthritis    Bradycardia    Bronchitis    Depression    Dyspnea    Glaucoma    Hypertension    Presence of Watchman left atrial appendage closure device 08/07/2021   Watchman FLX 27mm with Dr. Lalla Brothers   Seasonal allergies    Sleep apnea    Wears glasses     Past Surgical History:  Procedure Laterality Date   ABDOMINAL HYSTERECTOMY  1990   ATRIAL FIBRILLATION ABLATION N/A 08/12/2020   Procedure: ATRIAL FIBRILLATION ABLATION;  Surgeon: Lanier Prude, MD;  Location: [facility];  Service: Cardiovascular;  Laterality: N/A;   BREAST BIOPSY Right    benign   CARDIOVERSION N/A 08/15/2019   Procedure: CARDIOVERSION;  Surgeon: Elder Negus, MD;  Location: [facility] ENDOSCOPY;  Service:  Cardiovascular;  Laterality: N/A;   COLONOSCOPY     LEFT ATRIAL APPENDAGE OCCLUSION N/A 08/07/2021   Procedure: LEFT ATRIAL APPENDAGE OCCLUSION;  Surgeon: Lanier Prude, MD;  Location: [facility] CV [facility];  Service: Cardiovascular;  Laterality: N/A;   meniscus tear knee     left knee   NASAL SINUS SURGERY  1975   ORIF ANKLE FRACTURE  2009   left   TEE WITHOUT CARDIOVERSION N/A 08/07/2021   Procedure: TRANSESOPHAGEAL ECHOCARDIOGRAM (TEE);  Surgeon:  Lanier Prude, MD;  Location: [facility] INVASIVE CV LAB;  Service: Cardiovascular;  Laterality: N/A;   TOTAL KNEE ARTHROPLASTY Left 02/03/2019   Procedure: LEFT TOTAL KNEE ARTHROPLASTY;  Surgeon: Kathryne Hitch, MD;  Location: [facility];  Service: Orthopedics;  Laterality: Left;   TURBINATE REDUCTION Bilateral 06/04/2014   Procedure: BILATERAL TURBINATE REDUCTION;  Surgeon: Newman Pies, MD;  Location: [facility];  Service: ENT;  Laterality: Bilateral;   TYMPANOSTOMY TUBE PLACEMENT     left    Current Medications: Current Meds  Medication Sig   acetaminophen (TYLENOL) 500 MG tablet Take 1,000 mg by mouth every 6 (six) hours as needed. pain   acidophilus (RISAQUAD) CAPS capsule Take 1 capsule by mouth daily.   albuterol (VENTOLIN HFA) 108 (90 Base) MCG/ACT inhaler Inhale 1-2 puffs into the lungs every 6 (six) hours as needed for wheezing or shortness of breath.   amLODipine (NORVASC) 5 MG tablet Take 5 mg by mouth at bedtime.    aspirin EC 81 MG tablet Take 1 tablet (81 mg total) by mouth daily. Swallow whole.   b complex vitamins tablet Take 1 tablet by mouth daily.   buPROPion (WELLBUTRIN XL) 300 MG 24 hr tablet Take 150 mg by mouth daily.   calcium carbonate (OS-CAL - DOSED IN MG OF ELEMENTAL CALCIUM) 1250 (500 Ca) MG tablet Take 1 tablet by mouth daily with breakfast.   carvedilol (COREG) 6.25 MG tablet Take 1 tablet (6.25 mg total) by mouth 2 (two) times daily with a meal.   cetirizine (ZYRTEC) 10 MG tablet Take 10 mg by mouth at bedtime.   Cholecalciferol (VITAMIN D) 50 MCG (2000 UT) tablet Take 2,000 Units by mouth daily.   cyanocobalamin (VITAMIN B12) 1000 MCG/ML injection Inject 1,000 mcg into the muscle every 14 (fourteen) days.   FLUoxetine (PROZAC) 40 MG capsule Take 40 mg by mouth daily.   Fluticasone-Salmeterol (ADVAIR) 250-50 MCG/DOSE AEPB Inhale 1 puff into the lungs 2 (two) times daily as needed (asthma).   gabapentin (NEURONTIN) 300 MG capsule Take 300 mg by  mouth 3 (three) times daily.   guaiFENesin (MUCINEX) 600 MG 12 hr tablet Take 600 mg by mouth daily as needed for cough or to loosen phlegm.   ipratropium (ATROVENT) 0.06 % nasal spray Place 1 spray into both nostrils daily as needed for rhinitis.   latanoprost (XALATAN) 0.005 % ophthalmic solution Place 1 drop into both eyes every morning.    meloxicam (MOBIC) 15 MG tablet Take 7.5 mg by mouth daily as needed for pain.   methylphenidate (RITALIN) 10 MG tablet Take 10 mg by mouth 2 (two) times daily.   montelukast (SINGULAIR) 10 MG tablet Take 10 mg by mouth at bedtime.   Multiple Vitamins-Minerals (MULTIVITAMIN WITH MINERALS) tablet Take 1 tablet by mouth daily.   nitroGLYCERIN (NITROSTAT) 0.4 MG SL tablet Place 0.4 mg under the tongue every 5 (five) minutes as needed for chest pain.   Omega-3 Fatty Acids (FISH OIL) 1000 MG CAPS Take  1,000 mg by mouth daily.   omeprazole (PRILOSEC) 20 MG capsule Take 20 mg by mouth daily as needed (acid reflux).   rosuvastatin (CRESTOR) 20 MG tablet Take 1 tablet by mouth once daily   vitamin C (ASCORBIC ACID) 500 MG tablet Take 500 mg by mouth daily.     Allergies:   Dexlansoprazole, Flecainide, Other, Zetia [ezetimibe], Cefaclor, Ciprofibrate, Ciprofloxacin, and Metronidazole   Social History   Socioeconomic History   Marital status: Married    Spouse name: Not on file   Number of children: 0   Years of education: Not on file   Highest education level: Not on file  Occupational History   Not on file  Tobacco Use   Smoking status: Former    Packs/day: 2.00    Years: 30.00    Additional pack years: 0.00    Total pack years: 60.00    Types: Cigarettes    Quit date: 05/28/1998    Years since quitting: 24.1   Smokeless tobacco: Never  Vaping Use   Vaping Use: Never used  Substance and Sexual Activity   Alcohol use: Yes    Alcohol/week: 3.0 - 4.0 standard drinks of alcohol    Types: 2 Glasses of wine, 1 - 2 Cans of beer per week    Comment:  daily-wine daily-1 glass   Drug use: No   Sexual activity: Not on file  Other Topics Concern   Not on file  Social History Narrative   Not on file   Social Determinants of Health   Financial Resource Strain: Not on file  Food Insecurity: Not on file  Transportation Needs: Not on file  Physical Activity: Not on file  Stress: Not on file  Social Connections: Not on file     Family History: The patient's family history includes Hyperlipidemia in her mother; Hypertension in her mother; Lung cancer in her father; Lymphoma in her mother.  ROS:   Please see the history of present illness.     All other systems reviewed and are negative.  EKGs/Labs/Other Studies Reviewed:    The following studies were reviewed today:  Echo 04/22: IMPRESSIONS    1. Left ventricular ejection fraction, by estimation, is 55 to 60%. The  left ventricle has normal function. The left ventricle has no regional  wall motion abnormalities. There is mild left ventricular hypertrophy.  Left ventricular diastolic parameters  are indeterminate.   2. Right ventricular systolic function is normal. The right ventricular  size is normal. There is normal pulmonary artery systolic pressure. The  estimated right ventricular systolic pressure is 27.6 mmHg.   3. Left atrial size was moderately dilated.   4. Right atrial size was severely dilated.   5. The mitral valve is normal in structure. Mild mitral valve  regurgitation.   6. The aortic valve is tricuspid. Aortic valve regurgitation is mild. No  aortic stenosis is present.   7. Aortic dilatation noted. There is mild dilatation of the ascending  aorta, measuring 39 mm.   8. The inferior vena cava is normal in size with greater than 50%  respiratory variability, suggesting right atrial pressure of 3 mmHg.   EKG:   07/21/22: Accelerated junctional rhythm, rate 94, incomplete right bundle branch block, poor R wave progression 01/08/22: sinus bradycardia, rate  55, first degree AV block, LAD, iRBBB, poor R wave progression 07/01/21: NSR, rate 55 01/22: atrial fibrillation, rate 85, incomplete right bundle branch block  Recent Labs: 09/08/2021: BUN 16; Creatinine, Ser 0.64;  Hemoglobin 12.5; Platelets 308; Potassium 4.6; Sodium 134  Recent Lipid Panel    Component Value Date/Time   CHOL 179 01/08/2022 1514   TRIG 63 01/08/2022 1514   HDL 90 01/08/2022 1514   CHOLHDL 2.0 01/08/2022 1514   LDLCALC 77 01/08/2022 1514    Physical Exam:    VS:  BP 116/76 (BP Location: Left Arm, Patient Position: Sitting, Cuff Size: Normal)   Pulse 94   Ht 5\' 5"  (1.651 m)   Wt 152 lb (68.9 kg)   SpO2 95%   BMI 25.29 kg/m     Wt Readings from Last 3 Encounters:  07/21/22 152 lb (68.9 kg)  01/08/22 153 lb 3.2 oz (69.5 kg)  09/08/21 146 lb (66.2 kg)     GEN:  in no acute distress HEENT: Normal NECK: No JVD; No carotid bruits LYMPHATICS: No lymphadenopathy CARDIAC: Normal rate, RRR, no murmurs RESPIRATORY:  Clear to auscultation without rales, wheezing or rhonchi  ABDOMEN: Soft, non-tender, non-distended MUSCULOSKELETAL:  No edema; No deformity  SKIN: Warm and dry NEUROLOGIC:  Alert and oriented x 3 PSYCHIATRIC:  Normal affect  ASSESSMENT:    1. Persistent atrial fibrillation (HCC)   2. Aortic dilatation (HCC)   3. Mitral valve insufficiency, unspecified etiology   4. Aortic valve insufficiency, etiology of cardiac valve disease unspecified   5. Essential hypertension   6. Hyperlipidemia, unspecified hyperlipidemia type      PLAN:    Atrial fibrillation: Persistent. CHA2DS2-VASc score 3 (hypertension, age, female).  Successful cardioversion in June 2021 but went back into A. fib.  Was tried on flecainide but had adverse reaction.  Underwent ablation with Dr. Lalla Brothers on 08/12/2020.  Appears to be maintaining sinus rhythm.  She was tolerating Xarelto, but having significant issues with arthritis and unable to take NSAIDs due to Xarelto.  She was  referred to Dr. Lalla Brothers and underwent Watchman left atrial appendage occlusion on 08/07/2021.  Xarelto was discontinued -Continue carvedilol 6.25 mg twice daily -Rhythm appears accelerated junctional in clinic today.  Asymptomatic.  Recommend ZIO patch x 7 days for further evaluation  Mitral regurgitation: Moderate by report on echo 02/06/2020, repeat echo on 06/24/2020 showed mild MR.  Cardiac MRI on 07/17/2021 showed mild mitral annular disjunction with trivial MR  Aortic regurgitation: Moderate by report on echo 02/06/2020 repeat echo on 06/24/2020 showed mild AI.  Cardiac MRI on 07/17/2021 showed mild to moderate AI (regurgitant fraction 14%)  Hypertension: On amlodipine 5 mg daily and carvedilol 6.25 mg twice daily.  Appears controlled  PAD: Mild PAD RLE (ABI 0.92), normal in LLE (ABI 1.0) 01/2020.  ABIs 01/2022 were normal -Continue statin, ASA  Hyperlipidemia: On rosuvastatin 10 mg daily, LDL 98 on 12/27/2020.  Goal LDL less than 70 given her PAD, increase rosuvastatin to 20mg  daily.  LDL 77 on 01/08/2022.  Zetia 10 mg added but unable to tolerate.  Calcium score 0 on CT chest 09/2021.  LDL 84 on 07/20/2022  OSA: Started on CPAP but has been unable to tolerate mask.  BMI 26.  Referred to Dr. Jenne Pane in ENT to evaluate for Inspire, but was told she was not a candidate due to OSA not being severe enough.  She is not interested in dental appliance  Aortic dilatation: Measured 40 mm on CT chest 09/2021.  Recommend echocardiogram in 1 year to monitor   RTC in 6 months   Medication Adjustments/Labs and Tests Ordered: Current medicines are reviewed at length with the patient today.  Concerns regarding medicines are  outlined above.  Orders Placed This Encounter  Procedures   LONG TERM MONITOR (3-14 DAYS)   EKG 12-Lead   ECHOCARDIOGRAM COMPLETE    No orders of the defined types were placed in this encounter.    Patient Instructions  Medication Instructions:  Your physician recommends that you  continue on your current medications as directed. Please refer to the Current Medication list given to you today.   *If you need a refill on your cardiac medications before your next appointment, please call your pharmacy*  Testing/Procedures: Your physician has requested that you have an echocardiogram in 12 MONTHS. Echocardiography is a painless test that uses sound waves to create images of your heart. It provides your doctor with information about the size and shape of your heart and how well your heart's chambers and valves are working. This procedure takes approximately one hour. There are no restrictions for this procedure. Please do NOT wear cologne, perfume, aftershave, or lotions (deodorant is allowed). Please arrive 15 minutes prior to your appointment time.   ZIO XT- Long Term Monitor Instructions   Your physician has requested you wear a ZIO patch monitor for _7_ days.  This is a single patch monitor.   [employer] supplies one patch monitor per enrollment. Additional stickers are not available. Please do not apply patch if you will be having a Nuclear Stress Test, Echocardiogram, Cardiac CT, MRI, or Chest Xray during the period you would be wearing the monitor. The patch cannot be worn during these tests. You cannot remove and re-apply the ZIO XT patch monitor.  Your ZIO patch monitor will be sent Fed Ex from [employer] directly to your home address. It may take 3-5 days to receive your monitor after you have been enrolled.  Once you have received your monitor, please review the enclosed instructions. Your monitor has already been registered assigning a specific monitor serial # to you.  Billing and Patient Assistance Program Information   We have supplied [employer] with any of your insurance information on file for billing purposes. [employer] offers a sliding scale Patient Assistance Program for patients that do not have insurance, or whose insurance does not completely cover the  cost of the ZIO monitor.   You must apply for the Patient Assistance Program to qualify for this discounted rate.     To apply, please call [employer] at [phone], select option 4, then select option 2, and ask to apply for Patient Assistance Program.  Meredeth Ide will ask your household income, and how many people are in your household.  They will quote your out-of-pocket cost based on that information.  [employer] will also be able to set up a 31-month, interest-free payment plan if needed.  Applying the monitor   Shave hair from upper left chest.  Hold abrader disc by orange tab. Rub abrader in 40 strokes over the upper left chest as indicated in your monitor instructions.  Clean area with 4 enclosed alcohol pads. Let dry.  Apply patch as indicated in monitor instructions. Patch will be placed under collarbone on left side of chest with arrow pointing upward.  Rub patch adhesive wings for 2 minutes. Remove white label marked "1". Remove the white label marked "2". Rub patch adhesive wings for 2 additional minutes.  While looking in a mirror, press and release button in center of patch. A small green light will flash 3-4 times. This will be your only indicator that the monitor has been turned on. ?  Do not shower for  the first 24 hours. You may shower after the first 24 hours.  Press the button if you feel a symptom. You will hear a small click. Record Date, Time and Symptom in the Patient Logbook.  When you are ready to remove the patch, follow instructions on the last 2 pages of the Patient Logbook. Stick patch monitor onto the last page of Patient Logbook.  Place Patient Logbook in the blue and white box.  Use locking tab on box and tape box closed securely.  The blue and white box has prepaid postage on it. Please place it in the mailbox as soon as possible. Your physician should have your test results approximately 7 days after the monitor has been mailed back to [facility].  Call [facility]  Customer Care at [phone] if you have questions regarding your ZIO XT patch monitor. Call them immediately if you see an orange light blinking on your monitor.  If your monitor falls off in less than 4 days, contact our Monitor department at [phone]. ?If your monitor becomes loose or falls off after 4 days call [employer] at [phone] for suggestions on securing your monitor.?  Follow-Up: At [facility], you and your health needs are our priority.  As part of our continuing mission to provide you with exceptional heart care, we have created designated Provider Care Teams.  These Care Teams include your primary Cardiologist (physician) and Advanced Practice Providers (APPs -  Physician Assistants and Nurse Practitioners) who all work together to provide you with the care you need, when you need it.  We recommend signing up for the patient portal called "MyChart".  Sign up information is provided on this After Visit Summary.  MyChart is used to connect with patients for Virtual Visits (Telemedicine).  Patients are able to view lab/test results, encounter notes, upcoming appointments, etc.  Non-urgent messages can be sent to your provider as well.   To learn more about what you can do with MyChart, go to [email or web address].    Your next appointment:   6 month(s)  Provider:   Little Ishikawa, MD          Signed, Little Ishikawa, MD  07/21/2022 4:34 PM    [facility]

## 2022-07-21 NOTE — Progress Notes (Unsigned)
Enrolled for [employer] to mail a ZIO XT long term holter monitor to the patients address on file.  

## 2022-07-22 ENCOUNTER — Telehealth: Payer: Self-pay | Admitting: Cardiology

## 2022-07-22 NOTE — Telephone Encounter (Signed)
Patient states during her appointment yesterday Dr. Bjorn Pippin asked if she has had any SOB. She said no at the time, but when she got home she remembered she has had SOB with exertion, but she assumed it was due to her age/overall health.  Pt c/o Shortness Of Breath: STAT if SOB developed within the last 24 hours or pt is noticeably SOB on the phone  1. Are you currently SOB (can you hear that pt is SOB on the phone)?  No   2. How long have you been experiencing SOB?  Patient states it has been a while, but its difficult to gauge exactly how long  3. Are you SOB when sitting or when up moving around?  When up and moving around, going upstairs or doing water aerobics   4. Are you currently experiencing any other symptoms?  No

## 2022-07-22 NOTE — Telephone Encounter (Signed)
She states the doctor asked her in appt if any SOB and she states she said no.  She states he then talked about the heart monitor and if any chest pain or SOB. She again said no.  She states once she got home she thought about it and she states "Yes" I am SOB when I do things or activity and with water aerobics.  She just wants him to be aware. Advised would pass the information on for him to be aware.

## 2022-07-27 DIAGNOSIS — I4819 Other persistent atrial fibrillation: Secondary | ICD-10-CM | POA: Diagnosis not present

## 2022-07-28 DIAGNOSIS — M47817 Spondylosis without myelopathy or radiculopathy, lumbosacral region: Secondary | ICD-10-CM | POA: Diagnosis not present

## 2022-07-28 DIAGNOSIS — M7061 Trochanteric bursitis, right hip: Secondary | ICD-10-CM | POA: Diagnosis not present

## 2022-07-28 DIAGNOSIS — M7071 Other bursitis of hip, right hip: Secondary | ICD-10-CM | POA: Diagnosis not present

## 2022-07-31 DIAGNOSIS — H9202 Otalgia, left ear: Secondary | ICD-10-CM | POA: Diagnosis not present

## 2022-07-31 DIAGNOSIS — H6522 Chronic serous otitis media, left ear: Secondary | ICD-10-CM | POA: Diagnosis not present

## 2022-07-31 DIAGNOSIS — H6982 Other specified disorders of Eustachian tube, left ear: Secondary | ICD-10-CM | POA: Diagnosis not present

## 2022-07-31 DIAGNOSIS — H9012 Conductive hearing loss, unilateral, left ear, with unrestricted hearing on the contralateral side: Secondary | ICD-10-CM | POA: Diagnosis not present

## 2022-08-03 ENCOUNTER — Other Ambulatory Visit: Payer: Self-pay

## 2022-08-03 DIAGNOSIS — M1711 Unilateral primary osteoarthritis, right knee: Secondary | ICD-10-CM

## 2022-08-07 DIAGNOSIS — I4819 Other persistent atrial fibrillation: Secondary | ICD-10-CM | POA: Diagnosis not present

## 2022-08-20 DIAGNOSIS — E7800 Pure hypercholesterolemia, unspecified: Secondary | ICD-10-CM | POA: Diagnosis not present

## 2022-08-20 DIAGNOSIS — I10 Essential (primary) hypertension: Secondary | ICD-10-CM | POA: Diagnosis not present

## 2022-08-20 DIAGNOSIS — H409 Unspecified glaucoma: Secondary | ICD-10-CM | POA: Diagnosis not present

## 2022-08-20 DIAGNOSIS — J42 Unspecified chronic bronchitis: Secondary | ICD-10-CM | POA: Diagnosis not present

## 2022-08-20 DIAGNOSIS — I4891 Unspecified atrial fibrillation: Secondary | ICD-10-CM | POA: Diagnosis not present

## 2022-08-20 DIAGNOSIS — K219 Gastro-esophageal reflux disease without esophagitis: Secondary | ICD-10-CM | POA: Diagnosis not present

## 2022-08-20 DIAGNOSIS — M1990 Unspecified osteoarthritis, unspecified site: Secondary | ICD-10-CM | POA: Diagnosis not present

## 2022-08-25 ENCOUNTER — Encounter: Payer: Self-pay | Admitting: Physician Assistant

## 2022-08-25 ENCOUNTER — Ambulatory Visit: Payer: Medicare HMO | Admitting: Physician Assistant

## 2022-08-25 DIAGNOSIS — M1711 Unilateral primary osteoarthritis, right knee: Secondary | ICD-10-CM | POA: Diagnosis not present

## 2022-08-25 MED ORDER — HYALURONAN 88 MG/4ML IX SOSY
88.0000 mg | PREFILLED_SYRINGE | INTRA_ARTICULAR | Status: AC | PRN
  Administered 2022-08-25: 88 mg via INTRA_ARTICULAR

## 2022-08-25 NOTE — Progress Notes (Signed)
   Procedure Note  Patient: Michelle Johnston             Date of Birth: [birth year]           MRN: [id number]             Visit Date: 08/25/2022 HPI: Mrs.Grieb comes in today for scheduled right knee Monovisc injection.  She has known osteoarthritis right knee.  No new injury.  She has tried cortisone injections and oral NSAIDs.  She has no scheduled surgery on the right knee next 6 months.  Physical exam: Right knee good range of motion.  Positive effusion no abnormal warmth erythema.  Procedures: Visit Diagnoses:  1. Primary osteoarthritis of right knee     Large Joint Inj: R knee on 08/25/2022 1:55 PM Indications: pain Details: 22 G 1.5 in needle, superolateral approach  Arthrogram: No  Medications: 88 mg Hyaluronan 88 MG/4ML Aspirate: 38 mL yellow Outcome: tolerated well, no immediate complications Procedure, treatment alternatives, risks and benefits explained, specific risks discussed. Consent was given by the patient. Immediately prior to procedure a time out was called to verify the correct patient, procedure, equipment, support staff and site/side marked as required. Patient was prepped and draped in the usual sterile fashion.     Plan: She understands to wait at least 6 months between viscosupplementation injections.  She will follow-up with [place] as needed.  Questions were encouraged and answered at length.

## 2022-09-01 DIAGNOSIS — M545 Low back pain, unspecified: Secondary | ICD-10-CM | POA: Diagnosis not present

## 2022-09-08 DIAGNOSIS — M545 Low back pain, unspecified: Secondary | ICD-10-CM | POA: Diagnosis not present

## 2022-09-10 ENCOUNTER — Telehealth: Payer: Self-pay | Admitting: Cardiology

## 2022-09-10 NOTE — Telephone Encounter (Signed)
Patient is returning phone call in regards to her lab results. Please advise.

## 2022-09-10 NOTE — Telephone Encounter (Signed)
Appointment scheduled with Francis Dowse PA 8/16 at 9:15 am.

## 2022-09-10 NOTE — Telephone Encounter (Signed)
Patient states calling back regarding monitor results and back in Afib.  Gave results per Dr Bjorn Pippin and advised that she will be called for cardioversion.  She states she would like to talk to Dr Lalla Brothers before she does this procedure.  Please advise

## 2022-09-10 NOTE — Telephone Encounter (Signed)
Spoke to patient she stated she does not want to see Afib clinic.She requested appointment with Dr.Lambert.Stated he did her afib ablation.Advised I will send a message to his scheduler to schedule appointment.

## 2022-09-17 DIAGNOSIS — M545 Low back pain, unspecified: Secondary | ICD-10-CM | POA: Diagnosis not present

## 2022-09-18 ENCOUNTER — Ambulatory Visit (HOSPITAL_COMMUNITY): Payer: Medicare HMO | Admitting: Internal Medicine

## 2022-09-21 DIAGNOSIS — M545 Low back pain, unspecified: Secondary | ICD-10-CM | POA: Diagnosis not present

## 2022-09-29 DIAGNOSIS — M545 Low back pain, unspecified: Secondary | ICD-10-CM | POA: Diagnosis not present

## 2022-10-07 DIAGNOSIS — M545 Low back pain, unspecified: Secondary | ICD-10-CM | POA: Diagnosis not present

## 2022-10-07 DIAGNOSIS — M47817 Spondylosis without myelopathy or radiculopathy, lumbosacral region: Secondary | ICD-10-CM | POA: Diagnosis not present

## 2022-10-13 DIAGNOSIS — M545 Low back pain, unspecified: Secondary | ICD-10-CM | POA: Diagnosis not present

## 2022-10-16 ENCOUNTER — Encounter: Payer: Self-pay | Admitting: Physician Assistant

## 2022-10-16 ENCOUNTER — Ambulatory Visit: Payer: Medicare HMO | Admitting: Physician Assistant

## 2022-10-16 VITALS — BP 150/70 | HR 66 | Ht 65.0 in | Wt 149.0 lb

## 2022-10-16 DIAGNOSIS — I4819 Other persistent atrial fibrillation: Secondary | ICD-10-CM | POA: Diagnosis not present

## 2022-10-16 DIAGNOSIS — R0602 Shortness of breath: Secondary | ICD-10-CM

## 2022-10-16 DIAGNOSIS — I10 Essential (primary) hypertension: Secondary | ICD-10-CM

## 2022-10-16 NOTE — Patient Instructions (Addendum)
Medication Instructions:   Your physician recommends that you continue on your current medications as directed. Please refer to the Current Medication list given to you today.  *If you need a refill on your cardiac medications before your next appointment, please call your pharmacy*   Lab Work: NONE ORDERED  TODAY   If you have labs (blood work) drawn today and your tests are completely normal, you will receive your results only by: MyChart Message (if you have MyChart) OR A paper copy in the mail If you have any lab test that is abnormal or we need to change your treatment, we will call you to review the results.   Testing/Procedures: Your physician has requested that you have an echocardiogram. Echocardiography is a painless test that uses sound waves to create images of your heart. It provides your doctor with information about the size and shape of your heart and how well your heart's chambers and valves are working. This procedure takes approximately one hour. There are no restrictions for this procedure. Please do NOT wear cologne, perfume, aftershave, or lotions (deodorant is allowed). Please arrive 15 minutes prior to your appointment time.      Follow-Up: At [facility], you and your health needs are our priority.  As part of our continuing mission to provide you with exceptional heart care, we have created designated Provider Care Teams.  These Care Teams include your primary Cardiologist (physician) and Advanced Practice Providers (APPs -  Physician Assistants and Nurse Practitioners) who all work together to provide you with the care you need, when you need it.  We recommend signing up for the patient portal called "MyChart".  Sign up information is provided on this After Visit Summary.  MyChart is used to connect with patients for Virtual Visits (Telemedicine).  Patients are able to view lab/test results, encounter notes, upcoming appointments, etc.  Non-urgent  messages can be sent to your provider as well.   To learn more about what you can do with MyChart, go to [email or web address].    Your next appointment:   3 month(s)  Provider:   You may see Lanier Prude, MD  ONLY!!    Other Instructions

## 2022-10-16 NOTE — Progress Notes (Signed)
Cardiology Office Note Date:  10/16/2022  Patient ID:  Michelle Johnston, Michelle Johnston 15-Sep-1950, MRN [id number] PCP:  Lupita Raider, MD  Cardiologist:  Dr. Bjorn Pippin Electrophysiologist: Dr. Lalla Brothers     Chief Complaint:  1 year  History of Present Illness: Michelle Johnston is a 72 y.o. female with history of OSA ), (under/not treated),  HTN, HLD, AFib  She saw Dr. Lalla Brothers 07/10/21 for consideration of watchman, discussed arthritis and need for NSAIDs unable to 2/2 her OAC  She saw Arelia Longest, NP 09/08/21 for her post procedure visit Watchman FLX 27mm device on 08/07/21.  Medication plan was to continue Xarelto 20mg  PO daily for 45 days.  At the 45 day mark, she will stop Xarelto (7/19) and start Plavix 75mg  (7/20) by mouth once daily.  She will require SBE prophylaxis with Amoxicillin 2g one hour prior to dental cleanings and procedures only for the next 6 months.   She has seen Dr. Bjorn Pippin a few times since, last on 07/21/22, feeling well, EKG w/accel junctional and planned for a monitor, coreg continued.  Monitored noted 100% AFib, Dr. Bjorn Pippin rec AFib clinic visit/DCCV, she asked to see Dr. Lalla Brothers and given an appt to see me.  TODAY She generally is OK Initially she tells me that since she has been in AFib she feels more tired, fatigues easier. Goes to water aerobics and still is, but some days is harder and stops early.  After he ERK noting she is in sinus, she is a little surprised, says she was told she is 100% AFib and in revisiting her symptoms, reports good days/ less then good days, not escalating/changing, and not new necessarily, but assumed that she was in Afib and that is whey she felt fatigued.  otherwise NO CP No rest SOB No near syncope or syncope. Sometimes she stumbles, feels off balance, but no falls, no near syncope or syncope   AF/AAD hx Flecainide intolerant PVI ablation 08/12/20 Watchman 08/07/21  Past Medical History:  Diagnosis Date   Anxiety    Arthritis     Bradycardia    Bronchitis    Depression    Dyspnea    Glaucoma    Hypertension    Presence of Watchman left atrial appendage closure device 08/07/2021   Watchman FLX 27mm with Dr. Lalla Brothers   Seasonal allergies    Sleep apnea    Wears glasses     Past Surgical History:  Procedure Laterality Date   ABDOMINAL HYSTERECTOMY  1990   ATRIAL FIBRILLATION ABLATION N/A 08/12/2020   Procedure: ATRIAL FIBRILLATION ABLATION;  Surgeon: Lanier Prude, MD;  Location: [facility];  Service: Cardiovascular;  Laterality: N/A;   BREAST BIOPSY Right    benign   CARDIOVERSION N/A 08/15/2019   Procedure: CARDIOVERSION;  Surgeon: Elder Negus, MD;  Location: [facility] ENDOSCOPY;  Service: Cardiovascular;  Laterality: N/A;   COLONOSCOPY     LEFT ATRIAL APPENDAGE OCCLUSION N/A 08/07/2021   Procedure: LEFT ATRIAL APPENDAGE OCCLUSION;  Surgeon: Lanier Prude, MD;  Location: [facility];  Service: Cardiovascular;  Laterality: N/A;   meniscus tear knee     left knee   NASAL SINUS SURGERY  1975   ORIF ANKLE FRACTURE  2009   left   TEE WITHOUT CARDIOVERSION N/A 08/07/2021   Procedure: TRANSESOPHAGEAL ECHOCARDIOGRAM (TEE);  Surgeon: Lanier Prude, MD;  Location: [facility] INVASIVE CV LAB;  Service: Cardiovascular;  Laterality: N/A;   TOTAL KNEE ARTHROPLASTY Left 02/03/2019  Procedure: LEFT TOTAL KNEE ARTHROPLASTY;  Surgeon: Kathryne Hitch, MD;  Location: [facility];  Service: Orthopedics;  Laterality: Left;   TURBINATE REDUCTION Bilateral 06/04/2014   Procedure: BILATERAL TURBINATE REDUCTION;  Surgeon: Newman Pies, MD;  Location: [facility];  Service: ENT;  Laterality: Bilateral;   TYMPANOSTOMY TUBE PLACEMENT     left    Current Outpatient Medications  Medication Sig Dispense Refill   acetaminophen (TYLENOL) 500 MG tablet Take 1,000 mg by mouth every 6 (six) hours as needed. pain     acidophilus (RISAQUAD) CAPS capsule Take 1 capsule by mouth daily.     albuterol (VENTOLIN  HFA) 108 (90 Base) MCG/ACT inhaler Inhale 1-2 puffs into the lungs every 6 (six) hours as needed for wheezing or shortness of breath.     amLODipine (NORVASC) 5 MG tablet Take 5 mg by mouth at bedtime.      aspirin EC 81 MG tablet Take 1 tablet (81 mg total) by mouth daily. Swallow whole. 90 tablet 3   b complex vitamins tablet Take 1 tablet by mouth daily.     calcium carbonate (OS-CAL - DOSED IN MG OF ELEMENTAL CALCIUM) 1250 (500 Ca) MG tablet Take 1 tablet by mouth daily with breakfast.     carvedilol (COREG) 6.25 MG tablet Take 1 tablet (6.25 mg total) by mouth 2 (two) times daily with a meal. 180 tablet 3   cetirizine (ZYRTEC) 10 MG tablet Take 10 mg by mouth at bedtime.     Cholecalciferol (VITAMIN D) 50 MCG (2000 UT) tablet Take 2,000 Units by mouth daily.     cyanocobalamin (VITAMIN B12) 1000 MCG/ML injection Inject 1,000 mcg into the muscle every 14 (fourteen) days.     FLUoxetine (PROZAC) 40 MG capsule Take 40 mg by mouth daily.     Fluticasone-Salmeterol (ADVAIR) 250-50 MCG/DOSE AEPB Inhale 1 puff into the lungs 2 (two) times daily as needed (asthma).     gabapentin (NEURONTIN) 300 MG capsule Take 300 mg by mouth 3 (three) times daily.     guaiFENesin (MUCINEX) 600 MG 12 hr tablet Take 600 mg by mouth daily as needed for cough or to loosen phlegm.     ipratropium (ATROVENT) 0.06 % nasal spray Place 1 spray into both nostrils daily as needed for rhinitis.     latanoprost (XALATAN) 0.005 % ophthalmic solution Place 1 drop into both eyes every morning.      meloxicam (MOBIC) 15 MG tablet Take 7.5 mg by mouth daily as needed for pain.     methylphenidate (RITALIN) 10 MG tablet Take 10 mg by mouth 2 (two) times daily.     montelukast (SINGULAIR) 10 MG tablet Take 10 mg by mouth at bedtime.     Multiple Vitamins-Minerals (MULTIVITAMIN WITH MINERALS) tablet Take 1 tablet by mouth daily.     nitroGLYCERIN (NITROSTAT) 0.4 MG SL tablet Place 0.4 mg under the tongue every 5 (five) minutes as  needed for chest pain.     Omega-3 Fatty Acids (FISH OIL) 1000 MG CAPS Take 1,000 mg by mouth daily.     omeprazole (PRILOSEC) 20 MG capsule Take 20 mg by mouth daily as needed (acid reflux).     rosuvastatin (CRESTOR) 20 MG tablet Take 1 tablet by mouth once daily 90 tablet 2   vitamin C (ASCORBIC ACID) 500 MG tablet Take 500 mg by mouth daily.     No current facility-administered medications for this visit.    Allergies:   Dexlansoprazole, Flecainide, Other, Zetia [  ezetimibe], Cefaclor, Ciprofibrate, Ciprofloxacin, and Metronidazole   Social History:  The patient  reports that she quit smoking about 24 years ago. Her smoking use included cigarettes. She started smoking about 54 years ago. She has a 60 pack-year smoking history. She has never used smokeless tobacco. She reports current alcohol use of about 3.0 - 4.0 standard drinks of alcohol per week. She reports that she does not use drugs.   Family History:  The patient's family history includes Hyperlipidemia in her mother; Hypertension in her mother; Lung cancer in her father; Lymphoma in her mother.  ROS:  Please see the history of present illness.    All other systems are reviewed and otherwise negative.   PHYSICAL EXAM:  VS:  BP (!) 180/74   Pulse 66   Ht 5\' 5"  (1.651 m)   Wt 149 lb (67.6 kg)   SpO2 98%   BMI 24.79 kg/m  BMI: Body mass index is 24.79 kg/m. Well nourished, well developed, in no acute distress HEENT: normocephalic, atraumatic Neck: no JVD, carotid bruits or masses Cardiac:  RRR; no significant murmurs, no rubs, or gallops Lungs:  CTA b/l, no wheezing, rhonchi or rales Abd: soft, nontender MS: no deformity or atrophy Ext: no edema Skin: warm and dry, no rash Neuro:  No gross deficits appreciated Psych: euthymic mood, full affect   EKG:  Done today and reviewed by myself shows  SB 57bpm, LAD, 1st degree AVblock , no significant change from last sinus EKG  June 2024: Monitor   100% atrial  fibrillation/flutter burden with average rate 94 bpm.   7 days of data recorded on Zio monitor. Patient had a min HR of 52 bpm, max HR of 122 bpm, and avg HR of 94 bpm. Predominant underlying rhythm was Sinus Rhythm. No VT, SVT,  high degree block, or pauses noted.  100% atrial fibrillation burden with average rate 94 bpm.  Isolated ventricular ectopy was rare (<1%). There were 7 triggered events, corresponding to atrial fibrillation.  08/07/21: Watchman CONCLUSIONS:  1.Successful implantation of a WATCHMAN left atrial appendage occlusive device    2. TEE demonstrating no LAA thrombus 3. No early apparent complications.   08/07/21: TEE 1. Chicken wing LAA with no thrombus. 27 mm FlX well placed with negative  tugg test and no gaps by color flow Average compression 25%.   2. Left ventricular ejection fraction, by estimation, is 60 to 65%. The  left ventricle has normal function.   3. Right ventricular systolic function is normal. The right ventricular  size is normal.   4. Left atrial size was moderately dilated. No left atrial/left atrial  appendage thrombus was detected.   5. The mitral valve is normal in structure. Trivial mitral valve  regurgitation.   6. The aortic valve is tricuspid. Aortic valve regurgitation is moderate.   7. Aortic dilatation noted. There is mild dilatation of the ascending  aorta, measuring 39 mm.   8. Left to right shunt post trans septal.   08/12/20: EPS/ablation CONCLUSIONS: 1. Successful PVI 2. Successful ablation/isolation of the posterior wall 3. Three-dimensional electroanatomic mapping revealed significant scarring of the left atrium surrounding the pulmonary veins and patchy scarring of the posterior wall 4. Intracardiac echo reveals left common os, trivial pericardial effusion 5. No early apparent complications. 6. If recurrence of atrial fibrillation, consider antiarrhythmic therapy (likely dofetilide)     Recent Labs: No results found for  requested labs within last 365 days.  01/08/2022: Chol/HDL Ratio 2.0; Cholesterol, Total 179;  HDL 90; LDL Chol Calc (NIH) 77; Triglycerides 63   CrCl cannot be calculated (Patient's most recent lab result is older than the maximum 21 days allowed.).   Wt Readings from Last 3 Encounters:  10/16/22 149 lb (67.6 kg)  07/21/22 152 lb (68.9 kg)  01/08/22 153 lb 3.2 oz (69.5 kg)     Other studies reviewed: Additional studies/records reviewed today include: summarized above  ASSESSMENT AND PLAN:  Persistent AFib CHA2DS2Vasc is 3, off OAC s/p watchman 100% rate controlled on recent monitor Sinus today He symptoms fatigue and waxing/waning reduction in her exertional capacity, as we talked longer, sound chronic and unchanging though could be AF  She is not agreeable to consider AAD tx, having had a bad experience with Flecainide For now no changes She would like to f/u with Dr. Lalla Brothers She might consider repeat ablation if he thought she was a candidate  Will update her echo  2. HTN Recheck is 150/70 I have asked that she monitor at home and let us know if persistently elevated She says historically/usually well controlled    Disposition: F/u with Dr. Lalla Brothers in 3 mo, sooner if needed  Current medicines are reviewed at length with the patient today.  The patient did not have any concerns regarding medicines.  Norma Fredrickson, PA-C 10/16/2022 10:26 AM     [facility] [address] [place] [address] [phone] (office)  [phone] (fax)

## 2022-10-19 ENCOUNTER — Telehealth: Payer: Self-pay | Admitting: Cardiology

## 2022-10-19 NOTE — Telephone Encounter (Signed)
Returned call to pt. She is on 6.25mg  of Coreg BID. She was in the office on Friday and her BP was in the "160's". She was told to keep an eye on it. Saturday her BP was 162/89, Sunday's BP weas 161/88, 142/77 and 125/85. Today her BP is 153/72 one hour after she took her medicaiton. She chose Saturday to increase her Coreg to 12.5mg  BID. She states "I know it will not kill me because it didn't before". She wants advice from the provider on if this was ok and what to do from here. Advised pt that the message will be forwarded and once we receive recommendation someone will give her a call back. Pt. Verbalized understanding.

## 2022-10-19 NOTE — Telephone Encounter (Signed)
Pt returning call

## 2022-10-19 NOTE — Telephone Encounter (Signed)
Pt c/o medication issue:  1. Name of Medication:   carvedilol (COREG) 6.25 MG tablet    2. How are you currently taking this medication (dosage and times per day)?  Take 1 tablet (6.25 mg total) by mouth 2 (two) times daily with a meal.       3. Are you having a reaction (difficulty breathing--STAT)? No  4. What is your medication issue? Pt wanted to let office know that she started taking this medication twice a day, 2 tablets in the morning and 2 at night. She'd like a callback to discuss and she started doing this on Saturday and stated it been working for her. Please advise

## 2022-10-19 NOTE — Telephone Encounter (Signed)
Left voicemail to return call to office.

## 2022-10-20 DIAGNOSIS — M545 Low back pain, unspecified: Secondary | ICD-10-CM | POA: Diagnosis not present

## 2022-10-20 NOTE — Telephone Encounter (Signed)
Patient aware of provider recommendations. She verbalized understanding. She will give [place] a call in two weeks with BP/HR log

## 2022-10-20 NOTE — Telephone Encounter (Signed)
Called patient no answer.Unable to leave a message voice mail is full.

## 2022-10-20 NOTE — Telephone Encounter (Signed)
Her heart rate was 57 on EKG in clinic on 8/16 on carvedilol 6.25 mg twice daily.  Would not recommend increasing carvedilol as suspect this will lower heart rate too much.  Would go back to carvedilol 6.25 mg twice daily.  Would instead increase amlodipine to 10 mg daily.  Recommend checking BP/heart rate daily for next 2 weeks and let us know results

## 2022-10-20 NOTE — Telephone Encounter (Signed)
Called patient left message on personal voice mail to call back. 

## 2022-10-20 NOTE — Telephone Encounter (Signed)
Patient is returning call.  °

## 2022-10-27 DIAGNOSIS — M545 Low back pain, unspecified: Secondary | ICD-10-CM | POA: Diagnosis not present

## 2022-10-30 DIAGNOSIS — I4891 Unspecified atrial fibrillation: Secondary | ICD-10-CM | POA: Diagnosis not present

## 2022-10-30 DIAGNOSIS — Z95818 Presence of other cardiac implants and grafts: Secondary | ICD-10-CM | POA: Diagnosis not present

## 2022-10-30 DIAGNOSIS — I10 Essential (primary) hypertension: Secondary | ICD-10-CM | POA: Diagnosis not present

## 2022-10-30 DIAGNOSIS — E7800 Pure hypercholesterolemia, unspecified: Secondary | ICD-10-CM | POA: Diagnosis not present

## 2022-10-30 DIAGNOSIS — E538 Deficiency of other specified B group vitamins: Secondary | ICD-10-CM | POA: Diagnosis not present

## 2022-10-30 DIAGNOSIS — J42 Unspecified chronic bronchitis: Secondary | ICD-10-CM | POA: Diagnosis not present

## 2022-10-30 DIAGNOSIS — F331 Major depressive disorder, recurrent, moderate: Secondary | ICD-10-CM | POA: Diagnosis not present

## 2022-11-06 DIAGNOSIS — M4800 Spinal stenosis, site unspecified: Secondary | ICD-10-CM | POA: Diagnosis not present

## 2022-11-06 DIAGNOSIS — K219 Gastro-esophageal reflux disease without esophagitis: Secondary | ICD-10-CM | POA: Diagnosis not present

## 2022-11-06 DIAGNOSIS — F331 Major depressive disorder, recurrent, moderate: Secondary | ICD-10-CM | POA: Diagnosis not present

## 2022-11-06 DIAGNOSIS — I4891 Unspecified atrial fibrillation: Secondary | ICD-10-CM | POA: Diagnosis not present

## 2022-11-06 DIAGNOSIS — E538 Deficiency of other specified B group vitamins: Secondary | ICD-10-CM | POA: Diagnosis not present

## 2022-11-06 DIAGNOSIS — R1011 Right upper quadrant pain: Secondary | ICD-10-CM | POA: Diagnosis not present

## 2022-11-06 DIAGNOSIS — I10 Essential (primary) hypertension: Secondary | ICD-10-CM | POA: Diagnosis not present

## 2022-11-06 DIAGNOSIS — J42 Unspecified chronic bronchitis: Secondary | ICD-10-CM | POA: Diagnosis not present

## 2022-11-06 DIAGNOSIS — E7800 Pure hypercholesterolemia, unspecified: Secondary | ICD-10-CM | POA: Diagnosis not present

## 2022-11-09 ENCOUNTER — Other Ambulatory Visit: Payer: Self-pay | Admitting: Physician Assistant

## 2022-11-09 ENCOUNTER — Ambulatory Visit (HOSPITAL_COMMUNITY): Payer: Medicare HMO | Attending: Internal Medicine

## 2022-11-09 DIAGNOSIS — R0602 Shortness of breath: Secondary | ICD-10-CM | POA: Insufficient documentation

## 2022-11-09 DIAGNOSIS — R1011 Right upper quadrant pain: Secondary | ICD-10-CM

## 2022-11-09 LAB — ECHOCARDIOGRAM COMPLETE
Area-P 1/2: 4.17 cm2
P 1/2 time: 615 ms
S' Lateral: 2.9 cm

## 2022-11-10 DIAGNOSIS — H0288B Meibomian gland dysfunction left eye, upper and lower eyelids: Secondary | ICD-10-CM | POA: Diagnosis not present

## 2022-11-10 DIAGNOSIS — H401332 Pigmentary glaucoma, bilateral, moderate stage: Secondary | ICD-10-CM | POA: Diagnosis not present

## 2022-11-10 DIAGNOSIS — Z961 Presence of intraocular lens: Secondary | ICD-10-CM | POA: Diagnosis not present

## 2022-11-10 DIAGNOSIS — H26493 Other secondary cataract, bilateral: Secondary | ICD-10-CM | POA: Diagnosis not present

## 2022-11-10 DIAGNOSIS — H0288A Meibomian gland dysfunction right eye, upper and lower eyelids: Secondary | ICD-10-CM | POA: Diagnosis not present

## 2022-11-16 ENCOUNTER — Ambulatory Visit
Admission: RE | Admit: 2022-11-16 | Discharge: 2022-11-16 | Disposition: A | Discharge status: Home / Self Care | Payer: Medicare HMO | Source: Ambulatory Visit | Attending: Physician Assistant | Admitting: Physician Assistant

## 2022-11-16 DIAGNOSIS — R1011 Right upper quadrant pain: Secondary | ICD-10-CM | POA: Diagnosis not present

## 2022-11-17 DIAGNOSIS — H26491 Other secondary cataract, right eye: Secondary | ICD-10-CM | POA: Diagnosis not present

## 2022-11-23 DIAGNOSIS — M47817 Spondylosis without myelopathy or radiculopathy, lumbosacral region: Secondary | ICD-10-CM | POA: Diagnosis not present

## 2022-11-25 DIAGNOSIS — H26492 Other secondary cataract, left eye: Secondary | ICD-10-CM | POA: Diagnosis not present

## 2022-12-10 DIAGNOSIS — M7071 Other bursitis of hip, right hip: Secondary | ICD-10-CM | POA: Diagnosis not present

## 2022-12-10 DIAGNOSIS — M47817 Spondylosis without myelopathy or radiculopathy, lumbosacral region: Secondary | ICD-10-CM | POA: Diagnosis not present

## 2022-12-16 ENCOUNTER — Ambulatory Visit: Payer: Medicare HMO | Attending: Cardiology | Admitting: Cardiology

## 2022-12-16 ENCOUNTER — Encounter: Payer: Self-pay | Admitting: Cardiology

## 2022-12-16 VITALS — BP 144/74 | HR 56 | Ht 65.0 in | Wt 150.0 lb

## 2022-12-16 DIAGNOSIS — I4819 Other persistent atrial fibrillation: Secondary | ICD-10-CM | POA: Diagnosis not present

## 2022-12-16 DIAGNOSIS — I10 Essential (primary) hypertension: Secondary | ICD-10-CM | POA: Diagnosis not present

## 2022-12-16 DIAGNOSIS — I77819 Aortic ectasia, unspecified site: Secondary | ICD-10-CM

## 2022-12-16 DIAGNOSIS — Z95818 Presence of other cardiac implants and grafts: Secondary | ICD-10-CM | POA: Diagnosis not present

## 2022-12-16 NOTE — Patient Instructions (Signed)
Medication Instructions:  Your physician recommends that you continue on your current medications as directed. Please refer to the Current Medication list given to you today.  *If you need a refill on your cardiac medications before your next appointment, please call your pharmacy*   Testing/Procedures: You will have a loop recorder implanted at your next office visit. There are no restrictions or special instructions prior to this appointment.   Follow-Up: At [facility], you and your health needs are our priority.  As part of our continuing mission to provide you with exceptional heart care, we have created designated Provider Care Teams.  These Care Teams include your primary Cardiologist (physician) and Advanced Practice Providers (APPs -  Physician Assistants and Nurse Practitioners) who all work together to provide you with the care you need, when you need it.  Your next appointment:   Loop Recorder Implant  Provider:   Steffanie Dunn, MD

## 2022-12-16 NOTE — Progress Notes (Signed)
Electrophysiology Office Follow up Visit Note:    Date:  12/16/2022   ID:  Michelle Johnston, DOB 05/23/1950, MRN [id number]  PCP:  Lupita Raider, MD  [facility] Cardiologist:  Little Ishikawa, MD  [facility] Electrophysiologist:  Lanier Prude, MD    Interval History:    Michelle Johnston is a 72 y.o. female who presents for a follow up visit.   The patient had an A-fib ablation August 12, 2020 during which the veins and posterior wall were isolated.  She had a watchman implant on August 07, 2021.  Follow-up CT chest on October 13, 2021 showed a well-seated Watchman device without thrombus or leak.  She saw Luster Landsberg in clinic October 16, 2022.  Heart monitor in May showed 100% A-fib burden.  The patient told Luster Landsberg that when she is in atrial fibrillation she is more tired.  She still active and goes to water aerobics.  She was actually in sinus rhythm during the appointment with Renee.  Today she is doing well.  She reports some shortness of breath on a daily basis but this does not seem to correlate to her atrial fibrillation.  She tells me that when she was in A-fib at the time of her appoint with Dr. Bjorn Pippin, she felt more intense shortness of breath that she has not experienced since her appointment with Renee.      Past medical, surgical, social and family history were reviewed.  ROS:   Please see the history of present illness.    All other systems reviewed and are negative.  EKGs/Labs/Other Studies Reviewed:    The following studies were reviewed today:  November 09, 2022 echo EF 55-60 Severely dilated left atrium RV normal Mild MR Moderate to severe aortic insufficiency  August 28, 2022 ZIO monitor 100% A-fib, average rate 94  October 16, 2022 EKG shows sinus rhythm with first-degree AV delay    EKG Interpretation Date/Time:  Wednesday December 16 2022 14:31:05 EDT Ventricular Rate:  56 PR Interval:  264 QRS Duration:  112 QT Interval:  458 QTC  Calculation: 441 R Axis:   -60  Text Interpretation: Sinus bradycardia with 1st degree A-V block Incomplete right bundle branch block Left anterior fascicular block Confirmed by Steffanie Dunn [phone]) on 12/16/2022 2:49:26 PM    Physical Exam:    VS:  BP (!) 144/74   Pulse (!) 56   Ht 5\' 5"  (1.651 m)   Wt 150 lb (68 kg)   SpO2 96%   BMI 24.96 kg/m     Wt Readings from Last 3 Encounters:  12/16/22 150 lb (68 kg)  10/16/22 149 lb (67.6 kg)  07/21/22 152 lb (68.9 kg)     GEN:  Well nourished, well developed in no acute distress CARDIAC: RRR, no murmurs, rubs, gallops RESPIRATORY:  Clear to auscultation without rales, wheezing or rhonchi       ASSESSMENT:    1. Persistent atrial fibrillation (HCC)   2. Presence of Watchman left atrial appendage closure device   3. Aortic dilatation (HCC)   4. Primary hypertension    PLAN:    In order of problems listed above:  #Persistent atrial fibrillation Symptomatic.  She has had recurrence after her 2022 catheter ablation.  I discussed treatment options with the patient including continuing with a watchful waiting approach, antiarrhythmic drugs and catheter ablation.  The patient previously had a bad experience with antiarrhythmic drugs and would prefer to use nonpharmacologic options of possible.  It is  still unclear to me whether or not all of her symptoms of shortness of breath correspond to atrial fibrillation.  It is also not clear to me what the actual burden of her A-fib is given that we have not seen and on the 2 prior EKGs.  I discussed using a loop recorder for more long-term heart rhythm monitoring and she is interested in proceeding.  I discussed the procedure in detail including the risks and monthly monitoring cost and she wishes to proceed.  We will get this scheduled for her in the next few weeks.  If she ultimately has a significant burden of atrial fibrillation on the loop recorder, favor redo catheter ablation.  Plan  for [employer] loop recorder.  #Hypertension Above goal today.  Recommend checking blood pressures 1-2 times per week at home and recording the values.  Recommend bringing these recordings to the primary care physician. Continue amlodipine, Coreg     Signed, Steffanie Dunn, MD, [facility] 12/16/2022 2:58 PM    Electrophysiology [facility]

## 2022-12-29 ENCOUNTER — Telehealth: Payer: Self-pay | Admitting: Cardiology

## 2022-12-29 DIAGNOSIS — I4819 Other persistent atrial fibrillation: Secondary | ICD-10-CM

## 2022-12-29 NOTE — Telephone Encounter (Signed)
Patient c/o Palpitations:  High priority if patient c/o lightheadedness, shortness of breath, or chest pain  How long have you had palpitations/irregular HR/ Afib? Are you having the symptoms now?  Patient states she first went into afib Saturday (per BP machine) and her heart was racing. She states she is still in afib and BP/HR are elevated. Patient states she prefers not to go to the afib clinic because she does not want her heart shocked again + last time she took the medication for afib it caused major issues.  Are you currently experiencing lightheadedness, SOB or CP?  No   Do you have a history of afib (atrial fibrillation) or irregular heart rhythm?  Hx of afib   Have you checked your BP or HR? (document readings if available):  About 30 minutes ago - 143/86 98  Are you experiencing any other symptoms?  Chest pain/tightness Saturday

## 2022-12-29 NOTE — Telephone Encounter (Signed)
Spoke with the patient who states that she has been in AFIB since Saturday. Her only symptom is shortness of breath, which is what she has felt in the past. Her heart rate has been in the 90s. Highest reading was 104. Her heart rate normally runs in the 50s. She states that her blood pressure has been elevated as well around 140/80 with highest reading 151/96. She states that she does not want to see the A Fib clinic. She is not interested in another cardioversion unless Dr. Lalla Brothers thinks that is her best option at this time. She is also not interested in antiarrythmic drugs as she has not tolerated them in the past.  She has a watchman in place so it not on an anticoagulant.  She is taking carvedilol 6.25 mg twice daily. She has been on a higher dose in the past but it caused her heart rate to drop.  She is scheduled for a loop recorder implant with Dr. Lalla Brothers 12/4 for monitoring of her AFIB to determine whether a repeat ablation would be a good option for which she states she would be interested in having done.  Will make Dr. Lalla Brothers aware for further recommendations.

## 2022-12-31 NOTE — Telephone Encounter (Signed)
Spoke with the patient and gave recommendations from Dr. Lalla Brothers. I have scheduled her for an ablation on 03/25/22

## 2023-01-04 NOTE — Addendum Note (Signed)
Addended by: Frutoso Schatz on: 01/04/2023 04:03 PM   Modules accepted: Orders

## 2023-01-10 NOTE — Progress Notes (Unsigned)
Cardiology Office Note:    Date:  01/12/2023   ID:  Michelle Johnston, DOB 26-Jun-1950, MRN [id number]  PCP:  Lupita Raider, MD  Cardiologist:  Little Ishikawa, MD  Electrophysiologist:  Lanier Prude, MD   Referring MD: Lupita Raider, MD   Chief Complaint  Patient presents with   Chest Pain    Pt stated that she had chest pains Dec 24 2022 blood pressure and heart rate went up and lasted until nov 8th with high heart rate    History of Present Illness:    Michelle Johnston is a 72 y.o. female with a hx of paroxysmal atrial fibrillation, moderate MR, moderate AI, tobacco use, OSA who is referred by Dr. Clelia Croft for evaluation of atrial fibrillation.  Previously followed with Dr. Rosemary Holms in cardiology, last seen 02/07/2020.  She was started on flecainide for A. fib on 12/8, subsequently developed dizziness and blurry vision along with left upper arm numbness.  She was sent to the ED for concern for TIA/CVA.  MRI negative for stroke.  Neurology was consulted, thought that symptoms likely are due to adverse reaction to flecainide as opposed to TIA.  She had successful cardioversion on 08/15/2019 but did not sustain sinus rhythm, was back in A. fib at follow-up visit on 08/24/2019.  Echocardiogram on 03/12/2019 showed EF 55 to 60%, mild LVH, grade 1 diastolic dysfunction, moderate AI, moderate MR.  Lexiscan Myoview on 08/07/2019 showed normal perfusion, EF 43%.  ABIs on 01/02/2020 were mildly reduced in RLE (0.92) and normal in LLE (1.0). repeat echocardiogram 02/06/2020 showed EF 50 to 55%, moderate AI, moderate MR, moderate TR, severe left atrial dilatation, mild right atrial dilatation.  Echocardiogram 06/24/2020 showed EF 55 to 60%, normal RV function, moderate left atrial dilatation, severe right atrial dilatation, mild MR, mild AI, mild dilatation of ascending aorta measuring 39 mm.  Underwent Afib ablation with Dr. Lalla Brothers on 08/12/2020.  She was tolerating Xarelto, but having significant issues  with arthritis and unable to take NSAIDs due to Xarelto.  She was referred to Dr. Lalla Brothers and underwent Watchman left atrial appendage occlusion on 08/07/2021.  Xarelto was discontinued.  Cardiac MRI on 07/17/2021 showed normal biventricular function, no LGE, mild to moderate AI (regurgitant fraction 14%), mild mitral annular disjunction with trivial MR, dilated ascending aorta measuring 41 mm, dilated main pulmonary artery measuring 31 mm.  Zio patch x 7 days 08/2022 showed 100% A-fib burden with average rate 94 bpm.  Echocardiogram 11/2022 showed EF 55 to 60%, normal RV function, severe left atrial enlargement, moderate to severe AI.  Since last clinic visit, she reports she is doing okay.  Has been having recurrent A-fib.  States that she had an episode lasting 10/24 through 11/8.  Now back in sinus rhythm.  Has been having chest heaviness, occurs about once per month.  States it can last for hours.  No clear relationship with exertion.  She does water aerobics twice per week for 1 hour.  Reports has been having shortness of breath with exertion.   BP Readings from Last 3 Encounters:  01/12/23 124/62  12/16/22 (!) 144/74  10/16/22 (!) 150/70     Past Medical History:  Diagnosis Date   Anxiety    Arthritis    Bradycardia    Bronchitis    Depression    Dyspnea    Glaucoma    Hypertension    Presence of Watchman left atrial appendage closure device 08/07/2021   Watchman FLX 27mm with Dr.  Lalla Brothers   Seasonal allergies    Sleep apnea    Wears glasses     Past Surgical History:  Procedure Laterality Date   ABDOMINAL HYSTERECTOMY  1990   ATRIAL FIBRILLATION ABLATION N/A 08/12/2020   Procedure: ATRIAL FIBRILLATION ABLATION;  Surgeon: Lanier Prude, MD;  Location: [facility];  Service: Cardiovascular;  Laterality: N/A;   BREAST BIOPSY Right    benign   CARDIOVERSION N/A 08/15/2019   Procedure: CARDIOVERSION;  Surgeon: Elder Negus, MD;  Location: [facility] ENDOSCOPY;  Service:  Cardiovascular;  Laterality: N/A;   COLONOSCOPY     LEFT ATRIAL APPENDAGE OCCLUSION N/A 08/07/2021   Procedure: LEFT ATRIAL APPENDAGE OCCLUSION;  Surgeon: Lanier Prude, MD;  Location: [facility];  Service: Cardiovascular;  Laterality: N/A;   meniscus tear knee     left knee   NASAL SINUS SURGERY  1975   ORIF ANKLE FRACTURE  2009   left   TEE WITHOUT CARDIOVERSION N/A 08/07/2021   Procedure: TRANSESOPHAGEAL ECHOCARDIOGRAM (TEE);  Surgeon: Lanier Prude, MD;  Location: [facility] INVASIVE CV LAB;  Service: Cardiovascular;  Laterality: N/A;   TOTAL KNEE ARTHROPLASTY Left 02/03/2019   Procedure: LEFT TOTAL KNEE ARTHROPLASTY;  Surgeon: Kathryne Hitch, MD;  Location: [facility];  Service: Orthopedics;  Laterality: Left;   TURBINATE REDUCTION Bilateral 06/04/2014   Procedure: BILATERAL TURBINATE REDUCTION;  Surgeon: Newman Pies, MD;  Location: [facility];  Service: ENT;  Laterality: Bilateral;   TYMPANOSTOMY TUBE PLACEMENT     left    Current Medications: Current Meds  Medication Sig   acetaminophen (TYLENOL) 500 MG tablet Take 1,000 mg by mouth every 6 (six) hours as needed. pain   acidophilus (RISAQUAD) CAPS capsule Take 1 capsule by mouth daily.   albuterol (VENTOLIN HFA) 108 (90 Base) MCG/ACT inhaler Inhale 1-2 puffs into the lungs every 6 (six) hours as needed for wheezing or shortness of breath.   amLODipine (NORVASC) 5 MG tablet Take 10 mg by mouth at bedtime.   aspirin EC 81 MG tablet Take 1 tablet (81 mg total) by mouth daily. Swallow whole.   b complex vitamins tablet Take 1 tablet by mouth daily.   calcium carbonate (OS-CAL - DOSED IN MG OF ELEMENTAL CALCIUM) 1250 (500 Ca) MG tablet Take 1 tablet by mouth daily with breakfast.   carvedilol (COREG) 6.25 MG tablet Take 1 tablet (6.25 mg total) by mouth 2 (two) times daily with a meal.   cetirizine (ZYRTEC) 10 MG tablet Take 10 mg by mouth at bedtime.   Cholecalciferol (VITAMIN D) 50 MCG (2000 UT) tablet Take 2,000  Units by mouth daily.   cyanocobalamin (VITAMIN B12) 1000 MCG/ML injection Inject 1,000 mcg into the muscle every 14 (fourteen) days.   FLUoxetine (PROZAC) 40 MG capsule Take 40 mg by mouth daily.   Fluticasone-Salmeterol (ADVAIR) 250-50 MCG/DOSE AEPB Inhale 1 puff into the lungs 2 (two) times daily as needed (asthma).   gabapentin (NEURONTIN) 300 MG capsule Take 300 mg by mouth 3 (three) times daily.   guaiFENesin (MUCINEX) 600 MG 12 hr tablet Take 600 mg by mouth daily as needed for cough or to loosen phlegm.   ipratropium (ATROVENT) 0.06 % nasal spray Place 1 spray into both nostrils daily as needed for rhinitis.   latanoprost (XALATAN) 0.005 % ophthalmic solution Place 1 drop into both eyes every morning.    meloxicam (MOBIC) 15 MG tablet Take 7.5 mg by mouth daily as needed for pain.  methylphenidate (RITALIN) 10 MG tablet Take 10 mg by mouth 2 (two) times daily.   montelukast (SINGULAIR) 10 MG tablet Take 10 mg by mouth at bedtime.   Multiple Vitamins-Minerals (MULTIVITAMIN WITH MINERALS) tablet Take 1 tablet by mouth daily.   nitroGLYCERIN (NITROSTAT) 0.4 MG SL tablet Place 0.4 mg under the tongue every 5 (five) minutes as needed for chest pain.   Omega-3 Fatty Acids (FISH OIL) 1000 MG CAPS Take 1,000 mg by mouth daily.   omeprazole (PRILOSEC) 20 MG capsule Take 20 mg by mouth daily as needed (acid reflux).   rosuvastatin (CRESTOR) 20 MG tablet Take 1 tablet by mouth once daily   vitamin C (ASCORBIC ACID) 500 MG tablet Take 500 mg by mouth daily.     Allergies:   Dexlansoprazole, Flecainide, Other, Zetia [ezetimibe], Cefaclor, Ciprofibrate, Ciprofloxacin, and Metronidazole   Social History   Socioeconomic History   Marital status: Married    Spouse name: Not on file   Number of children: 0   Years of education: Not on file   Highest education level: Not on file  Occupational History   Not on file  Tobacco Use   Smoking status: Former    Current packs/day: 0.00    Average  packs/day: 2.0 packs/day for 30.0 years (60.0 ttl pk-yrs)    Types: Cigarettes    Start date: 05/27/1968    Quit date: 05/28/1998    Years since quitting: 24.6   Smokeless tobacco: Never  Vaping Use   Vaping status: Never Used  Substance and Sexual Activity   Alcohol use: Yes    Alcohol/week: 3.0 - 4.0 standard drinks of alcohol    Types: 2 Glasses of wine, 1 - 2 Cans of beer per week    Comment: daily-wine daily-1 glass   Drug use: No   Sexual activity: Not on file  Other Topics Concern   Not on file  Social History Narrative   Not on file   Social Determinants of Health   Financial Resource Strain: Not on file  Food Insecurity: Not on file  Transportation Needs: Not on file  Physical Activity: Not on file  Stress: Not on file  Social Connections: Unknown (07/15/2021)   Received from [facility], [facility]   Social Network    Social Network: Not on file     Family History: The patient's family history includes Hyperlipidemia in her mother; Hypertension in her mother; Lung cancer in her father; Lymphoma in her mother.  ROS:   Please see the history of present illness.     All other systems reviewed and are negative.  EKGs/Labs/Other Studies Reviewed:    The following studies were reviewed today:  Echo 04/22: IMPRESSIONS    1. Left ventricular ejection fraction, by estimation, is 55 to 60%. The  left ventricle has normal function. The left ventricle has no regional  wall motion abnormalities. There is mild left ventricular hypertrophy.  Left ventricular diastolic parameters  are indeterminate.   2. Right ventricular systolic function is normal. The right ventricular  size is normal. There is normal pulmonary artery systolic pressure. The  estimated right ventricular systolic pressure is 27.6 mmHg.   3. Left atrial size was moderately dilated.   4. Right atrial size was severely dilated.   5. The mitral valve is normal in structure. Mild mitral valve   regurgitation.   6. The aortic valve is tricuspid. Aortic valve regurgitation is mild. No  aortic stenosis is present.   7. Aortic dilatation  noted. There is mild dilatation of the ascending  aorta, measuring 39 mm.   8. The inferior vena cava is normal in size with greater than 50%  respiratory variability, suggesting right atrial pressure of 3 mmHg.   EKG:   07/21/22: Accelerated junctional rhythm, rate 94, incomplete right bundle branch block, poor R wave progression 01/08/22: sinus bradycardia, rate 55, first degree AV block, LAD, iRBBB, poor R wave progression 07/01/21: NSR, rate 55 01/22: atrial fibrillation, rate 85, incomplete right bundle branch block  Recent Labs: No results found for requested labs within last 365 days.  Recent Lipid Panel    Component Value Date/Time   CHOL 179 01/08/2022 1514   TRIG 63 01/08/2022 1514   HDL 90 01/08/2022 1514   CHOLHDL 2.0 01/08/2022 1514   LDLCALC 77 01/08/2022 1514    Physical Exam:    VS:  BP 124/62 (BP Location: Left Arm, Patient Position: Sitting, Cuff Size: Normal)   Pulse 60   Ht 5\' 5"  (1.651 m)   Wt 147 lb (66.7 kg)   SpO2 97%   BMI 24.46 kg/m     Wt Readings from Last 3 Encounters:  01/12/23 147 lb (66.7 kg)  12/16/22 150 lb (68 kg)  10/16/22 149 lb (67.6 kg)     GEN:  in no acute distress HEENT: Normal NECK: No JVD; No carotid bruits LYMPHATICS: No lymphadenopathy CARDIAC: Normal rate, RRR, no murmurs RESPIRATORY:  Clear to auscultation without rales, wheezing or rhonchi  ABDOMEN: Soft, non-tender, non-distended MUSCULOSKELETAL:  No edema; No deformity  SKIN: Warm and dry NEUROLOGIC:  Alert and oriented x 3 PSYCHIATRIC:  Normal affect  ASSESSMENT:    1. Precordial pain   2. Essential hypertension   3. Hyperlipidemia, unspecified hyperlipidemia type   4. Aortic valve insufficiency, etiology of cardiac valve disease unspecified   5. Aortic dilatation (HCC)     PLAN:    Chest pain: Reports atypical  chest pain.  Also reporting dyspnea on exertion that could represent anginal equivalent.  Does have multiple CAD risk factors (age, hypertension, hyperlipidemia).  Recommend coronary CTA to rule out obstructive CAD.  Heart rate well-controlled on home carvedilol, will continue prior to study  Atrial fibrillation: Persistent. CHA2DS2-VASc score 3 (hypertension, age, female).  Successful cardioversion in June 2021 but went back into A. fib.  Was tried on flecainide but had adverse reaction.  Underwent ablation with Dr. Lalla Brothers on 08/12/2020.  Appears to be maintaining sinus rhythm.  She was tolerating Xarelto, but having significant issues with arthritis and unable to take NSAIDs due to Xarelto.  She was referred to Dr. Lalla Brothers and underwent Watchman left atrial appendage occlusion on 08/07/2021.  Xarelto was discontinued -Continue carvedilol 6.25 mg twice daily -Has had recurrent Afib, planning repeat ablation with Dr. Lalla Brothers 03/2023  Mitral regurgitation: Moderate by report on echo 02/06/2020, repeat echo on 06/24/2020 showed mild MR.  Cardiac MRI on 07/17/2021 showed mild mitral annular disjunction with trivial MR  Aortic regurgitation: Moderate by report on echo 02/06/2020 repeat echo on 06/24/2020 showed mild AI.  Echo 06/2021 concerning for moderate to severe AI.  Moderate on TEE 07/2021.  Cardiac MRI on 07/17/2021 showed mild to moderate AI (regurgitant fraction 14%)  Hypertension: On amlodipine 5 mg daily and carvedilol 6.25 mg twice daily.  Appears controlled  PAD: Mild PAD RLE (ABI 0.92), normal in LLE (ABI 1.0) 01/2020.  ABIs 01/2022 were normal -Continue statin, ASA  Hyperlipidemia: On rosuvastatin 10 mg daily, LDL 98 on 12/27/2020.  Goal  LDL less than 70 given her PAD, increase rosuvastatin to 20mg  daily.  LDL 77 on 01/08/2022.  Zetia 10 mg added but unable to tolerate.  Calcium score 0 on CT chest 09/2021.  LDL 84 on 07/20/2022  OSA: Started on CPAP but has been unable to tolerate mask.  BMI 26.   Referred to Dr. Jenne Pane in ENT to evaluate for Inspire, but was told she was not a candidate due to OSA not being severe enough.  She is not interested in dental appliance  Aortic dilatation: Measured 40 mm on CT chest 09/2021.  Measured 38mm on echo 11/2022, will monitor  RTC in 6 months   Medication Adjustments/Labs and Tests Ordered: Current medicines are reviewed at length with the patient today.  Concerns regarding medicines are outlined above.  Orders Placed This Encounter  Procedures   CT CORONARY MORPH W/CTA COR W/SCORE W/CA W/CM &/OR WO/CM   Basic Metabolic Panel (BMET)    No orders of the defined types were placed in this encounter.    Patient Instructions  Medication Instructions:  Continue all current mediations  *If you need a refill on your cardiac medications before your next appointment, please call your pharmacy*   Lab Work: BMET today If you have labs (blood work) drawn today and your tests are completely normal, you will receive your results only by: MyChart Message (if you have MyChart) OR A paper copy in the mail If you have any lab test that is abnormal or we need to change your treatment, we will call you to review the results.   Testing/Procedures:   Your cardiac CT will be scheduled at one of the below locations:   [facility] [address] [place], [place] [address] [phone]  If scheduled at [facility], please arrive at the [facility] and Children's Entrance (Entrance C2) of [facility] 30 minutes prior to test start time. You can use the FREE valet parking offered at entrance C (encouraged to control the heart rate for the test)  Proceed to the [facility] Radiology Department (first floor) to check-in and test prep.  All radiology patients and guests should use entrance C2 at [facility], accessed from [facility], even though the hospital's physical address listed is [address]     There is spacious parking and easy access to the radiology department from the [facility] entrance. Please enter here and check-in with the desk attendant.   Please follow these instructions carefully (unless otherwise directed):  An IV will be required for this test and Nitroglycerin will be given.    On the Night Before the Test: Be sure to Drink plenty of water. Do not consume any caffeinated/decaffeinated beverages or chocolate 12 hours prior to your test. Do not take any antihistamines 12 hours prior to your test.    On the Day of the Test: Drink plenty of water until 1 hour prior to the test. Take Carvedilol  6.25 mg on Morning of test Do not eat any food 1 hour prior to test. You may take your regular medications prior to the test.  FEMALES- please wear underwire-free bra if available, avoid dresses & tight clothing      After the Test: Drink plenty of water. After receiving IV contrast, you may experience a mild flushed feeling. This is normal. On occasion, you may experience a mild rash up to 24 hours after the test. This is not dangerous. If  this occurs, you can take Benadryl 25 mg and increase your fluid intake. If you experience trouble breathing, this can be serious. If it is severe call 911 IMMEDIATELY. If it is mild, please call our office.  We will call to schedule your test 2-4 weeks out understanding that some insurance companies will need an authorization prior to the service being performed.   For more information and frequently asked questions, please visit our website : [email or web address]  For non-scheduling related questions, please contact the cardiac imaging nurse navigator should you have any questions/concerns: Cardiac Imaging Nurse Navigators Direct Office Dial: [phone]   For scheduling needs, including cancellations and rescheduling, please call [place], [phone].    Follow-Up: At [facility], you and your health needs are our priority.  As part of our continuing mission to provide you with exceptional heart care, we have created designated Provider Care Teams.  These Care Teams include your primary Cardiologist (physician) and Advanced Practice Providers (APPs -  Physician Assistants and Nurse Practitioners) who all work together to provide you with the care you need, when you need it.  We recommend signing up for the patient portal called "MyChart".  Sign up information is provided on this After Visit Summary.  MyChart is used to connect with patients for Virtual Visits (Telemedicine).  Patients are able to view lab/test results, encounter notes, upcoming appointments, etc.  Non-urgent messages can be sent to your provider as well.   To learn more about what you can do with MyChart, go to [email or web address].    Your next appointment:   6 month(s)  Provider:   Little Ishikawa, MD         Signed, Little Ishikawa, MD  01/12/2023 11:55 AM    [facility]

## 2023-01-12 ENCOUNTER — Encounter: Payer: Self-pay | Admitting: Cardiology

## 2023-01-12 ENCOUNTER — Ambulatory Visit: Payer: Medicare HMO | Attending: Cardiology | Admitting: Cardiology

## 2023-01-12 VITALS — BP 124/62 | HR 60 | Ht 65.0 in | Wt 147.0 lb

## 2023-01-12 DIAGNOSIS — I351 Nonrheumatic aortic (valve) insufficiency: Secondary | ICD-10-CM

## 2023-01-12 DIAGNOSIS — R072 Precordial pain: Secondary | ICD-10-CM

## 2023-01-12 DIAGNOSIS — I10 Essential (primary) hypertension: Secondary | ICD-10-CM | POA: Diagnosis not present

## 2023-01-12 DIAGNOSIS — E785 Hyperlipidemia, unspecified: Secondary | ICD-10-CM

## 2023-01-12 DIAGNOSIS — I77819 Aortic ectasia, unspecified site: Secondary | ICD-10-CM

## 2023-01-12 NOTE — Patient Instructions (Addendum)
Medication Instructions:  Continue all current mediations  *If you need a refill on your cardiac medications before your next appointment, please call your pharmacy*   Lab Work: BMET today If you have labs (blood work) drawn today and your tests are completely normal, you will receive your results only by: MyChart Message (if you have MyChart) OR A paper copy in the mail If you have any lab test that is abnormal or we need to change your treatment, we will call you to review the results.   Testing/Procedures:   Your cardiac CT will be scheduled at one of the below locations:   [facility] [address] [place], [place] [address] [phone]  If scheduled at [facility], please arrive at the [facility] and Children's Entrance (Entrance C2) of [facility] 30 minutes prior to test start time. You can use the FREE valet parking offered at entrance C (encouraged to control the heart rate for the test)  Proceed to the [facility] Radiology Department (first floor) to check-in and test prep.  All radiology patients and guests should use entrance C2 at [facility], accessed from [facility], even though the hospital's physical address listed is [address]     There is spacious parking and easy access to the radiology department from the [facility] entrance. Please enter here and check-in with the desk attendant.   Please follow these instructions carefully (unless otherwise directed):  An IV will be required for this test and Nitroglycerin will be given.    On the Night Before the Test: Be sure to Drink plenty of water. Do not consume any caffeinated/decaffeinated beverages or chocolate 12 hours prior to your test. Do not take any antihistamines 12 hours prior to your test.    On the Day of the Test: Drink plenty of water until 1 hour prior to the test. Take Carvedilol  6.25 mg on Morning of test Do not  eat any food 1 hour prior to test. You may take your regular medications prior to the test.  FEMALES- please wear underwire-free bra if available, avoid dresses & tight clothing      After the Test: Drink plenty of water. After receiving IV contrast, you may experience a mild flushed feeling. This is normal. On occasion, you may experience a mild rash up to 24 hours after the test. This is not dangerous. If this occurs, you can take Benadryl 25 mg and increase your fluid intake. If you experience trouble breathing, this can be serious. If it is severe call 911 IMMEDIATELY. If it is mild, please call our office.  We will call to schedule your test 2-4 weeks out understanding that some insurance companies will need an authorization prior to the service being performed.   For more information and frequently asked questions, please visit our website : [email or web address]  For non-scheduling related questions, please contact the cardiac imaging nurse navigator should you have any questions/concerns: Cardiac Imaging Nurse Navigators Direct Office Dial: [phone]   For scheduling needs, including cancellations and rescheduling, please call [place], [phone].    Follow-Up: At [facility], you and your health needs are our priority.  As part of our continuing mission to provide you with exceptional heart care, we have created designated Provider Care Teams.  These Care Teams include your primary Cardiologist (physician) and Advanced Practice Providers (APPs -  Physician Assistants and Nurse Practitioners) who all work together to provide you  with the care you need, when you need it.  We recommend signing up for the patient portal called "MyChart".  Sign up information is provided on this After Visit Summary.  MyChart is used to connect with patients for Virtual Visits (Telemedicine).  Patients are able to view lab/test results, encounter notes, upcoming appointments,  etc.  Non-urgent messages can be sent to your provider as well.   To learn more about what you can do with MyChart, go to [email or web address].    Your next appointment:   6 month(s)  Provider:   Little Ishikawa, MD

## 2023-01-13 ENCOUNTER — Telehealth (HOSPITAL_COMMUNITY): Payer: Self-pay | Admitting: *Deleted

## 2023-01-13 LAB — BASIC METABOLIC PANEL WITH GFR
BUN/Creatinine Ratio: 22 (ref 12–28)
BUN: 13 mg/dL (ref 8–27)
CO2: 24 mmol/L (ref 20–29)
Calcium: 9.2 mg/dL (ref 8.7–10.3)
Chloride: 97 mmol/L (ref 96–106)
Creatinine, Ser: 0.58 mg/dL (ref 0.57–1.00)
Glucose: 93 mg/dL (ref 70–99)
Potassium: 4.1 mmol/L (ref 3.5–5.2)
Sodium: 135 mmol/L (ref 134–144)
eGFR: 95 mL/min/1.73 (ref >59)

## 2023-01-13 NOTE — Telephone Encounter (Signed)
Reaching out to patient to offer assistance regarding upcoming cardiac imaging study; pt verbalizes understanding of appt date/time, parking situation and where to check in, pre-test NPO status and medications ordered, and verified current allergies; name and call back number provided for further questions should they arise Hayley Sharpe RN Navigator Cardiac Imaging [facility] [phone] office [phone] cell  

## 2023-01-14 ENCOUNTER — Ambulatory Visit (HOSPITAL_COMMUNITY)
Admission: RE | Admit: 2023-01-14 | Discharge: 2023-01-14 | Disposition: A | Discharge status: Home / Self Care | Payer: Medicare HMO | Source: Ambulatory Visit | Attending: Cardiology | Admitting: Cardiology

## 2023-01-14 DIAGNOSIS — R072 Precordial pain: Secondary | ICD-10-CM | POA: Insufficient documentation

## 2023-01-14 MED ORDER — IOHEXOL 350 MG/ML SOLN
100.0000 mL | Freq: Once | INTRAVENOUS | Status: AC | PRN
  Administered 2023-01-14: 100 mL via INTRAVENOUS

## 2023-01-14 MED ORDER — NITROGLYCERIN 0.4 MG SL SUBL
SUBLINGUAL_TABLET | SUBLINGUAL | Status: AC
  Filled 2023-01-14: qty 2

## 2023-01-14 MED ORDER — NITROGLYCERIN 0.4 MG SL SUBL
0.8000 mg | SUBLINGUAL_TABLET | Freq: Once | SUBLINGUAL | Status: AC
  Administered 2023-01-14: 0.8 mg via SUBLINGUAL

## 2023-01-21 ENCOUNTER — Other Ambulatory Visit: Payer: Self-pay | Admitting: Family Medicine

## 2023-01-21 DIAGNOSIS — Z1231 Encounter for screening mammogram for malignant neoplasm of breast: Secondary | ICD-10-CM

## 2023-01-21 DIAGNOSIS — I4891 Unspecified atrial fibrillation: Secondary | ICD-10-CM | POA: Diagnosis not present

## 2023-01-21 DIAGNOSIS — I351 Nonrheumatic aortic (valve) insufficiency: Secondary | ICD-10-CM | POA: Diagnosis not present

## 2023-01-21 DIAGNOSIS — J42 Unspecified chronic bronchitis: Secondary | ICD-10-CM | POA: Diagnosis not present

## 2023-01-21 DIAGNOSIS — M8580 Other specified disorders of bone density and structure, unspecified site: Secondary | ICD-10-CM

## 2023-01-21 DIAGNOSIS — D692 Other nonthrombocytopenic purpura: Secondary | ICD-10-CM | POA: Diagnosis not present

## 2023-01-21 DIAGNOSIS — Z0000 Encounter for general adult medical examination without abnormal findings: Secondary | ICD-10-CM | POA: Diagnosis not present

## 2023-01-21 DIAGNOSIS — Z95818 Presence of other cardiac implants and grafts: Secondary | ICD-10-CM | POA: Diagnosis not present

## 2023-01-21 DIAGNOSIS — E7800 Pure hypercholesterolemia, unspecified: Secondary | ICD-10-CM | POA: Diagnosis not present

## 2023-01-21 DIAGNOSIS — I77819 Aortic ectasia, unspecified site: Secondary | ICD-10-CM | POA: Diagnosis not present

## 2023-01-21 DIAGNOSIS — I10 Essential (primary) hypertension: Secondary | ICD-10-CM | POA: Diagnosis not present

## 2023-01-21 DIAGNOSIS — E538 Deficiency of other specified B group vitamins: Secondary | ICD-10-CM | POA: Diagnosis not present

## 2023-01-21 DIAGNOSIS — F331 Major depressive disorder, recurrent, moderate: Secondary | ICD-10-CM | POA: Diagnosis not present

## 2023-01-21 LAB — LAB REPORT - SCANNED
Albumin, Urine POC: 0.99
Creatinine, POC: 80 mg/dL
EGFR: 98
Microalb Creat Ratio: 12.4

## 2023-02-03 ENCOUNTER — Ambulatory Visit: Payer: Medicare HMO | Attending: Cardiology | Admitting: Cardiology

## 2023-02-03 VITALS — BP 164/80 | HR 61 | Ht 65.0 in | Wt 147.0 lb

## 2023-02-03 DIAGNOSIS — I10 Essential (primary) hypertension: Secondary | ICD-10-CM | POA: Diagnosis not present

## 2023-02-03 DIAGNOSIS — I480 Paroxysmal atrial fibrillation: Secondary | ICD-10-CM | POA: Diagnosis not present

## 2023-02-03 NOTE — Patient Instructions (Signed)
Medication Instructions:  Your physician recommends that you continue on your current medications as directed. Please refer to the Current Medication list given to you today.  *If you need a refill on your cardiac medications before your next appointment, please call your pharmacy*   Follow-Up: At [facility], you and your health needs are our priority.  As part of our continuing mission to provide you with exceptional heart care, we have created designated Provider Care Teams.  These Care Teams include your primary Cardiologist (physician) and Advanced Practice Providers (APPs -  Physician Assistants and Nurse Practitioners) who all work together to provide you with the care you need, when you need it.    Your next appointment:   Follow up with AFIB clinic 4 weeks after your procedure.  Follow up with Dr. Lalla Brothers or APP 3 months after your procedure.

## 2023-02-03 NOTE — Progress Notes (Signed)
Electrophysiology Office Follow up Visit Note:    Date:  02/03/2023   ID:  Michelle Johnston, DOB January 08, 1951, MRN [id number]  PCP:  Lupita Raider, MD  [facility] Cardiologist:  Little Ishikawa, MD  [facility] Electrophysiologist:  Lanier Prude, MD    Interval History:     Michelle Johnston is a 72 y.o. female who presents for a follow up visit.   I last saw the patient December 16, 2022 for her persistent atrial fibrillation.  She has a history of prior A-fib ablation in June 2022 during which the veins and posterior wall were isolated.  She had a watchman implanted August 07, 2021.  She is RNA in August 2024.  A heart monitor in May of this year showed 100% A-fib burden.  She is symptomatic while in atrial fibrillation with fatigue.  We previously discussed using a loop recorder for better characterization of her A-fib burden.    Today she tells me she has experienced more AF episodes that are symptomatic with dyspnea. They last at least 1 day. She is not currently on an [facility] given history of Watchman.   Past medical, surgical, social and family history were reviewed.  ROS:   Please see the history of present illness.    All other systems reviewed and are negative.  EKGs/Labs/Other Studies Reviewed:    The following studies were reviewed today:   EKG Interpretation Date/Time:  Wednesday February 03 2023 16:39:15 EST Ventricular Rate:  63 PR Interval:  238 QRS Duration:  104 QT Interval:  444 QTC Calculation: 454 R Axis:   -50  Text Interpretation: Sinus rhythm with 1st degree A-V block Incomplete right bundle branch block Left anterior fascicular block Confirmed by Steffanie Dunn [phone]) on 02/03/2023 8:09:39 PM    Physical Exam:    VS:  BP (!) 164/80   Pulse 61   Ht 5\' 5"  (1.651 m)   Wt 147 lb (66.7 kg)   SpO2 97%   BMI 24.46 kg/m     Recheck: 142/70  Wt Readings from Last 3 Encounters:  02/03/23 147 lb (66.7 kg)  01/12/23 147 lb (66.7 kg)  12/16/22  150 lb (68 kg)     GEN: no distress CARD: RRR, No MRG RESP: No IWOB. CTAB.      ASSESSMENT:    1. Paroxysmal atrial fibrillation (HCC)   2. Primary hypertension    PLAN:    In order of problems listed above:  Assessment and Plan  #Atrial Fibrillation Symptomatic. Recurrent despite prior ablation in 2022. I have discussed treatment options including antiarrhythmic drugs and redo catheter ablation and she would like to pursue redo catheter ablation.  Discussed treatment options today for AF including antiarrhythmic drug therapy and ablation. Discussed risks, recovery and likelihood of success with each treatment strategy. Risk, benefits, and alternatives to EP study and ablation for afib were discussed. These risks include but are not limited to stroke, bleeding, vascular damage, tamponade, perforation, damage to the esophagus, lungs, phrenic nerve and other structures, pulmonary vein stenosis, worsening renal function, coronary vasospasm and death.  Discussed potential need for repeat ablation procedures and antiarrhythmic drugs after an initial ablation. The patient understands these risk and wishes to proceed.  We will therefore proceed with catheter ablation at the next available time.  Carto, ICE, anesthesia are requested for the procedure.  Will also obtain CT PV protocol prior to the procedure to exclude LAA thrombus and further evaluate atrial anatomy.  She will need  to start her Xarelto 1 week prior to the ablation with plans to continue for 3 months after the procedure.    #Hypertension Above goal today.  Recommend checking blood pressures 1-2 times per week at home and recording the values.  Recommend bringing these recordings to the primary care physician.         Signed, Steffanie Dunn, MD, [facility], [facility] 02/03/2023 8:12 PM    Electrophysiology [facility]

## 2023-02-05 ENCOUNTER — Encounter (INDEPENDENT_AMBULATORY_CARE_PROVIDER_SITE_OTHER): Payer: Self-pay

## 2023-02-05 ENCOUNTER — Ambulatory Visit (INDEPENDENT_AMBULATORY_CARE_PROVIDER_SITE_OTHER): Payer: Medicare HMO | Admitting: Otolaryngology

## 2023-02-05 VITALS — Ht 65.0 in | Wt 147.0 lb

## 2023-02-05 DIAGNOSIS — H6123 Impacted cerumen, bilateral: Secondary | ICD-10-CM

## 2023-02-05 DIAGNOSIS — H6121 Impacted cerumen, right ear: Secondary | ICD-10-CM

## 2023-02-05 DIAGNOSIS — H6982 Other specified disorders of Eustachian tube, left ear: Secondary | ICD-10-CM

## 2023-02-05 DIAGNOSIS — H7202 Central perforation of tympanic membrane, left ear: Secondary | ICD-10-CM

## 2023-02-06 DIAGNOSIS — H6982 Other specified disorders of Eustachian tube, left ear: Secondary | ICD-10-CM | POA: Insufficient documentation

## 2023-02-06 DIAGNOSIS — H6121 Impacted cerumen, right ear: Secondary | ICD-10-CM | POA: Insufficient documentation

## 2023-02-06 DIAGNOSIS — H7202 Central perforation of tympanic membrane, left ear: Secondary | ICD-10-CM | POA: Insufficient documentation

## 2023-02-06 NOTE — Progress Notes (Signed)
Patient ID: Michelle Johnston, female   DOB: 10-13-1950, 72 y.o.   MRN: [id number]  Follow-up: Left ear eustachian tube dysfunction, left ventilating tube, hearing loss  HPI: The patient is a 72 year old female who returns today for follow-up evaluation.  The patient was previously seen for left ear eustachian tube dysfunction.  She underwent left myringotomy and tube placement in May 2023.  At her last visit 6 months ago, the left ventilating tube was in place but encased in cerumen.  The tube was replaced.  The patient also has a history of bilateral high-frequency sensorineural hearing loss.  The patient returns today reporting no significant otologic difficulty.  She denies any otalgia, otorrhea, or vertigo.  She also denies any recent change in her hearing.  Exam: Exam: General: Communicates without difficulty, well nourished, no acute distress. Head: Normocephalic, no evidence injury, no tenderness, facial buttresses intact without stepoff. Face/sinus: No tenderness to palpation and percussion. Facial movement is normal and symmetric. Eyes: PERRL, EOMI. No scleral icterus, conjunctivae clear. Neuro: CN II exam reveals vision grossly intact.  No nystagmus at any point of gaze. Ears: Auricles well formed without lesions.  Right ear cerumen impaction.  The left T-tube is in place and patent.  Nose: External evaluation reveals normal support and skin without lesions.  Dorsum is intact.  Anterior rhinoscopy reveals congested mucosa over anterior aspect of inferior turbinates and intact septum.  No purulence noted. Oral:  Oral cavity and oropharynx are intact, symmetric, without erythema or edema.  Mucosa is moist without lesions. Neck: Full range of motion without pain.  There is no significant lymphadenopathy.  No masses palpable.  Thyroid bed within normal limits to palpation.  Parotid glands and submandibular glands equal bilaterally without mass.  Trachea is midline. Neuro:  CN 2-12 grossly intact.    Procedure: Right ear cerumen disimpaction Anesthesia: None Description: Under the operating microscope, the cerumen is carefully removed with a combination of cerumen currette, alligator forceps, and suction catheters.  After the cerumen is removed, the right tympanic membrane is noted to be intact and mobile.  No mass, erythema, or lesions. The patient tolerated the procedure well.    Assessment: 1.  Right ear cerumen impaction.  After the disimpaction procedure, the right tympanic membrane is noted to be intact and mobile. 2.  The left T-tube is in place and patent. 3.  Left ear eustachian tube dysfunction.  Plan: 1.  Otomicroscopy with right ear cerumen disimpaction. 2.  The physical exam findings are reviewed with the patient. 3.  Continue dry ear precautions on the left side. 4.  The patient will return for reevaluation in 6 months.

## 2023-02-08 ENCOUNTER — Inpatient Hospital Stay
Admission: RE | Admit: 2023-02-08 | Discharge: 2023-02-08 | Discharge status: Home / Self Care | Payer: Medicare HMO | Source: Ambulatory Visit | Attending: Family Medicine | Admitting: Family Medicine

## 2023-02-08 DIAGNOSIS — Z1231 Encounter for screening mammogram for malignant neoplasm of breast: Secondary | ICD-10-CM

## 2023-02-16 ENCOUNTER — Ambulatory Visit: Payer: Medicare HMO | Admitting: Cardiology

## 2023-02-18 ENCOUNTER — Other Ambulatory Visit: Payer: Self-pay | Admitting: Cardiology

## 2023-02-22 ENCOUNTER — Ambulatory Visit: Payer: Medicare HMO | Admitting: Physician Assistant

## 2023-02-22 DIAGNOSIS — M1712 Unilateral primary osteoarthritis, left knee: Secondary | ICD-10-CM

## 2023-02-22 MED ORDER — METHYLPREDNISOLONE ACETATE 40 MG/ML IJ SUSP
40.0000 mg | INTRAMUSCULAR | Status: AC | PRN
  Administered 2023-02-22: 40 mg via INTRA_ARTICULAR

## 2023-02-22 MED ORDER — LIDOCAINE HCL 1 % IJ SOLN
3.0000 mL | INTRAMUSCULAR | Status: AC | PRN
  Administered 2023-02-22: 3 mL

## 2023-02-22 NOTE — Progress Notes (Signed)
   Procedure Note  Patient: Michelle Johnston             Date of Birth: 11-10-1950           MRN: [id number]             Visit Date: 02/22/2023 HPI: Mrs. Arellano comes in today requesting a right knee aspiration injection.  Last injection aspiration was 08/25/2022 and this was a viscosupplementation injection with 38 cc aspirated.  She has had no new injury.  She has had increased pain in the knee over the last month.  She takes meloxicam and Tylenol for knee pain.  Known osteoarthritis.  Review of systems: Denies any fevers chills.  Right knee: Good range of motion.  No abnormal warmth erythema. Positive effusion.  Ambulates without any assistive device.  Procedures: Visit Diagnoses:  1. Unilateral primary osteoarthritis, left knee     Large Joint Inj: R knee on 02/22/2023 9:23 AM Indications: pain Details: 22 G 1.5 in needle, anterolateral approach  Arthrogram: No  Medications: 3 mL lidocaine 1 %; 40 mg methylPREDNISolone acetate 40 MG/ML Aspirate: 60 mL yellow Outcome: tolerated well, no immediate complications Procedure, treatment alternatives, risks and benefits explained, specific risks discussed. Consent was given by the patient. Immediately prior to procedure a time out was called to verify the correct patient, procedure, equipment, support staff and site/side marked as required. Patient was prepped and draped in the usual sterile fashion.     Plan: She knows to wait at least 3 months between injections with cortisone.  Ace bandage was applied she will remove this this evening before retiring to bed.  Questions were encouraged and answered at length.

## 2023-03-02 ENCOUNTER — Ambulatory Visit (HOSPITAL_COMMUNITY)
Admission: RE | Admit: 2023-03-02 | Discharge: 2023-03-02 | Disposition: A | Discharge status: Home / Self Care | Payer: Medicare HMO | Source: Ambulatory Visit | Attending: Cardiology | Admitting: Cardiology

## 2023-03-02 DIAGNOSIS — I4819 Other persistent atrial fibrillation: Secondary | ICD-10-CM

## 2023-03-02 MED ORDER — IOHEXOL 350 MG/ML SOLN
95.0000 mL | Freq: Once | INTRAVENOUS | Status: AC | PRN
  Administered 2023-03-02: 95 mL via INTRAVENOUS

## 2023-03-08 ENCOUNTER — Ambulatory Visit: Payer: Medicare HMO | Admitting: Physician Assistant

## 2023-03-15 ENCOUNTER — Other Ambulatory Visit: Payer: Self-pay

## 2023-03-15 MED ORDER — RIVAROXABAN 20 MG PO TABS: 20.0000 mg | ORAL_TABLET | Freq: Every day | ORAL | 3 refills | Status: DC

## 2023-03-16 ENCOUNTER — Other Ambulatory Visit: Payer: Self-pay

## 2023-03-16 DIAGNOSIS — I480 Paroxysmal atrial fibrillation: Secondary | ICD-10-CM | POA: Diagnosis not present

## 2023-03-16 LAB — BASIC METABOLIC PANEL
BUN/Creatinine Ratio: 23 (ref 12–28)
BUN: 14 mg/dL (ref 8–27)
CO2: 22 mmol/L (ref 20–29)
Calcium: 9.2 mg/dL (ref 8.7–10.3)
Chloride: 100 mmol/L (ref 96–106)
Creatinine, Ser: 0.61 mg/dL (ref 0.57–1.00)
Glucose: 92 mg/dL (ref 70–99)
Potassium: 4.2 mmol/L (ref 3.5–5.2)
Sodium: 138 mmol/L (ref 134–144)
eGFR: 94 mL/min/{1.73_m2} (ref >59)

## 2023-03-25 NOTE — Pre-Procedure Instructions (Signed)
Instructed patient on the following items: Arrival time 0800 Nothing to eat or drink after midnight No meds AM of procedure Responsible person to drive you home and stay with you for 24 hrs  Have you missed any doses of anti-coagulant Xarelto- takes once a day, hasn't missed any doses

## 2023-03-26 ENCOUNTER — Ambulatory Visit (HOSPITAL_COMMUNITY): Payer: Medicare HMO | Admitting: Anesthesiology

## 2023-03-26 ENCOUNTER — Ambulatory Visit (HOSPITAL_COMMUNITY)
Admission: RE | Admit: 2023-03-26 | Discharge: 2023-03-26 | Disposition: A | Discharge status: Home / Self Care | Payer: Medicare HMO | Attending: Cardiology | Admitting: Cardiology

## 2023-03-26 ENCOUNTER — Other Ambulatory Visit: Payer: Self-pay

## 2023-03-26 ENCOUNTER — Ambulatory Visit (HOSPITAL_BASED_OUTPATIENT_CLINIC_OR_DEPARTMENT_OTHER): Payer: Medicare HMO | Admitting: Anesthesiology

## 2023-03-26 ENCOUNTER — Ambulatory Visit (HOSPITAL_COMMUNITY)
Admission: RE | Disposition: A | Discharge status: Home / Self Care | Payer: Medicare HMO | Source: Home / Self Care | Attending: Cardiology

## 2023-03-26 DIAGNOSIS — Z7901 Long term (current) use of anticoagulants: Secondary | ICD-10-CM | POA: Diagnosis not present

## 2023-03-26 DIAGNOSIS — R5383 Other fatigue: Secondary | ICD-10-CM | POA: Diagnosis not present

## 2023-03-26 DIAGNOSIS — F418 Other specified anxiety disorders: Secondary | ICD-10-CM

## 2023-03-26 DIAGNOSIS — I10 Essential (primary) hypertension: Secondary | ICD-10-CM | POA: Insufficient documentation

## 2023-03-26 DIAGNOSIS — I4891 Unspecified atrial fibrillation: Secondary | ICD-10-CM | POA: Diagnosis not present

## 2023-03-26 DIAGNOSIS — I480 Paroxysmal atrial fibrillation: Secondary | ICD-10-CM | POA: Insufficient documentation

## 2023-03-26 DIAGNOSIS — Z95818 Presence of other cardiac implants and grafts: Secondary | ICD-10-CM | POA: Insufficient documentation

## 2023-03-26 DIAGNOSIS — G4733 Obstructive sleep apnea (adult) (pediatric): Secondary | ICD-10-CM | POA: Diagnosis not present

## 2023-03-26 HISTORY — PX: ATRIAL FIBRILLATION ABLATION: EP1191

## 2023-03-26 LAB — CBC
HCT: 39 % (ref 36.0–46.0)
Hemoglobin: 13.4 g/dL (ref 12.0–15.0)
MCH: 34.5 pg — ABNORMAL HIGH (ref 26.0–34.0)
MCHC: 34.4 g/dL (ref 30.0–36.0)
MCV: 100.5 fL — ABNORMAL HIGH (ref 80.0–100.0)
Platelets: 290 10*3/uL (ref 150–400)
RBC: 3.88 MIL/uL (ref 3.87–5.11)
RDW: 12.3 % (ref 11.5–15.5)
WBC: 4.7 10*3/uL (ref 4.0–10.5)
nRBC: 0 % (ref 0.0–0.2)

## 2023-03-26 LAB — POCT ACTIVATED CLOTTING TIME: Activated Clotting Time: 250 s

## 2023-03-26 SURGERY — ATRIAL FIBRILLATION ABLATION
Anesthesia: General

## 2023-03-26 MED ORDER — DEXAMETHASONE SODIUM PHOSPHATE 10 MG/ML IJ SOLN
INTRAMUSCULAR | Status: DC | PRN
  Administered 2023-03-26: 5 mg via INTRAVENOUS

## 2023-03-26 MED ORDER — ONDANSETRON HCL 4 MG/2ML IJ SOLN
4.0000 mg | Freq: Once | INTRAMUSCULAR | Status: DC | PRN
Start: 2023-03-26 — End: 2023-03-26

## 2023-03-26 MED ORDER — SODIUM CHLORIDE 0.9% FLUSH: 3.0000 mL | INTRAVENOUS | Status: DC | PRN

## 2023-03-26 MED ORDER — ACETAMINOPHEN 10 MG/ML IV SOLN: 1000.0000 mg | Freq: Once | INTRAVENOUS | Status: DC | PRN

## 2023-03-26 MED ORDER — OXYCODONE HCL 5 MG/5ML PO SOLN: 5.0000 mg | Freq: Once | ORAL | Status: DC | PRN

## 2023-03-26 MED ORDER — HEPARIN (PORCINE) IN NACL 1000-0.9 UT/500ML-% IV SOLN
INTRAVENOUS | Status: DC | PRN
  Administered 2023-03-26 (×3): 500 mL

## 2023-03-26 MED ORDER — CEFAZOLIN SODIUM-DEXTROSE 2-3 GM-%(50ML) IV SOLR
INTRAVENOUS | Status: DC | PRN
  Administered 2023-03-26: 2 g via INTRAVENOUS

## 2023-03-26 MED ORDER — ROCURONIUM BROMIDE 10 MG/ML (PF) SYRINGE
PREFILLED_SYRINGE | INTRAVENOUS | Status: DC | PRN
  Administered 2023-03-26: 60 mg via INTRAVENOUS
  Administered 2023-03-26 (×3): 20 mg via INTRAVENOUS

## 2023-03-26 MED ORDER — FENTANYL CITRATE (PF) 100 MCG/2ML IJ SOLN: 25.0000 ug | INTRAMUSCULAR | Status: DC | PRN

## 2023-03-26 MED ORDER — ONDANSETRON HCL 4 MG/2ML IJ SOLN: 4.0000 mg | Freq: Four times a day (QID) | INTRAMUSCULAR | Status: DC | PRN

## 2023-03-26 MED ORDER — PROPOFOL 10 MG/ML IV BOLUS
INTRAVENOUS | Status: DC | PRN
  Administered 2023-03-26: 80 mg via INTRAVENOUS

## 2023-03-26 MED ORDER — PROTAMINE SULFATE 10 MG/ML IV SOLN
INTRAVENOUS | Status: DC | PRN
  Administered 2023-03-26: 30 mg via INTRAVENOUS

## 2023-03-26 MED ORDER — ONDANSETRON HCL 4 MG/2ML IJ SOLN
INTRAMUSCULAR | Status: DC | PRN
  Administered 2023-03-26: 4 mg via INTRAVENOUS

## 2023-03-26 MED ORDER — FENTANYL CITRATE (PF) 100 MCG/2ML IJ SOLN
INTRAMUSCULAR | Status: AC
  Filled 2023-03-26: qty 2

## 2023-03-26 MED ORDER — ACETAMINOPHEN 325 MG PO TABS
650.0000 mg | ORAL_TABLET | ORAL | Status: DC | PRN
Start: 2023-03-26 — End: 2023-03-26

## 2023-03-26 MED ORDER — LIDOCAINE 2% (20 MG/ML) 5 ML SYRINGE
INTRAMUSCULAR | Status: DC | PRN
  Administered 2023-03-26: 80 mg via INTRAVENOUS

## 2023-03-26 MED ORDER — OXYCODONE HCL 5 MG PO TABS: 5.0000 mg | ORAL_TABLET | Freq: Once | ORAL | Status: DC | PRN

## 2023-03-26 MED ORDER — SODIUM CHLORIDE 0.9 % IV SOLN: 250.0000 mL | INTRAVENOUS | Status: DC | PRN

## 2023-03-26 MED ORDER — SODIUM CHLORIDE 0.9% FLUSH: 3.0000 mL | Freq: Two times a day (BID) | INTRAVENOUS | Status: DC

## 2023-03-26 MED ORDER — SUGAMMADEX SODIUM 200 MG/2ML IV SOLN
INTRAVENOUS | Status: DC | PRN
  Administered 2023-03-26: 200 mg via INTRAVENOUS

## 2023-03-26 MED ORDER — CEFAZOLIN SODIUM-DEXTROSE 2-4 GM/100ML-% IV SOLN
INTRAVENOUS | Status: AC
  Filled 2023-03-26: qty 100

## 2023-03-26 MED ORDER — PHENYLEPHRINE HCL-NACL 20-0.9 MG/250ML-% IV SOLN
INTRAVENOUS | Status: DC | PRN
  Administered 2023-03-26: 25 ug/min via INTRAVENOUS

## 2023-03-26 MED ORDER — SODIUM CHLORIDE 0.9 % IV SOLN: INTRAVENOUS | Status: DC

## 2023-03-26 MED ORDER — PHENYLEPHRINE 80 MCG/ML (10ML) SYRINGE FOR IV PUSH (FOR BLOOD PRESSURE SUPPORT)
PREFILLED_SYRINGE | INTRAVENOUS | Status: DC | PRN
  Administered 2023-03-26: 80 ug via INTRAVENOUS

## 2023-03-26 MED ORDER — ATROPINE SULFATE 1 MG/10ML IJ SOSY
PREFILLED_SYRINGE | INTRAMUSCULAR | Status: DC | PRN
  Administered 2023-03-26: .5 mg via INTRAVENOUS

## 2023-03-26 MED ORDER — FENTANYL CITRATE (PF) 250 MCG/5ML IJ SOLN
INTRAMUSCULAR | Status: DC | PRN
  Administered 2023-03-26: 100 ug via INTRAVENOUS

## 2023-03-26 MED ORDER — HEPARIN SODIUM (PORCINE) 1000 UNIT/ML IJ SOLN
INTRAMUSCULAR | Status: DC | PRN
  Administered 2023-03-26: 5000 [IU] via INTRAVENOUS
  Administered 2023-03-26: 11000 [IU] via INTRAVENOUS

## 2023-03-26 SURGICAL SUPPLY — 20 items
BAG SNAP BAND KOVER 36X36 (MISCELLANEOUS) IMPLANT
CABLE PFA RX CATH CONN (CABLE) IMPLANT
CATH 8FR REPROCESSED SOUNDSTAR (CATHETERS) ×1 IMPLANT
CATH 8FR SOUNDSTAR REPROCESSED (CATHETERS) IMPLANT
CATH FARAWAVE ABLATION 31 (CATHETERS) IMPLANT
CATH OCTARAY 2.0 F 3-3-3-3-3 (CATHETERS) IMPLANT
CATH WEBSTER BI DIR CS D-F CRV (CATHETERS) IMPLANT
CLOSURE PERCLOSE PROSTYLE (VASCULAR PRODUCTS) IMPLANT
COVER SWIFTLINK CONNECTOR (BAG) ×1 IMPLANT
DILATOR VESSEL 38 20CM 16FR (INTRODUCER) IMPLANT
GUIDEWIRE INQWIRE 1.5J.035X260 (WIRE) IMPLANT
INQWIRE 1.5J .035X260CM (WIRE) ×1 IMPLANT
KIT VERSACROSS CNCT FARADRIVE (KITS) IMPLANT
PACK EP LF (CUSTOM PROCEDURE TRAY) ×1 IMPLANT
PAD DEFIB RADIO PHYSIO CONN (PAD) ×1 IMPLANT
PATCH CARTO3 (PAD) IMPLANT
SHEATH FARADRIVE STEERABLE (SHEATH) IMPLANT
SHEATH PINNACLE 8F 10CM (SHEATH) IMPLANT
SHEATH PINNACLE 9F 10CM (SHEATH) IMPLANT
SHEATH PROBE COVER 6X72 (BAG) IMPLANT

## 2023-03-26 NOTE — Discharge Instructions (Addendum)
Cardiac Ablation, Care After  This sheet gives you information about how to care for yourself after your procedure. Your health care provider may also give you more specific instructions. If you have problems or questions, contact your health care provider. What can I expect after the procedure? After the procedure, it is common to have: Bruising around your puncture site. Tenderness around your puncture site. Skipped heartbeats. If you had an atrial fibrillation ablation, you may have atrial fibrillation during the first several months after your procedure.  Tiredness (fatigue).  Follow these instructions at home: Puncture site care  Follow instructions from your health care provider about how to take care of your puncture site. Make sure you: Remove square bandages tomorrow at 3:00 pm.  Check your puncture site every day for signs of infection. Check for: Redness, swelling, or pain. Fluid or blood. If your puncture site starts to bleed, lie down on your back, apply firm pressure to the area, and contact your health care provider. Warmth. Pus or a bad smell. A pea or marble sized lump/knot at the site is normal and can take up to three months to resolve.  Driving Do not drive for at least 4 days after your procedure or however long your health care provider recommends. (Do not resume driving if you have previously been instructed not to drive for other health reasons.) Do not drive or use heavy machinery while taking prescription pain medicine. Activity Avoid activities that take a lot of effort for at least 7 days after your procedure. Do not lift anything that is heavier than 5 lb (4.5 kg) for one week.  No sexual activity for 1 week.  Return to your normal activities as told by your health care provider. Ask your health care provider what activities are safe for you. General instructions Take over-the-counter and prescription medicines only as told by your health care provider. Do  not use any products that contain nicotine or tobacco, such as cigarettes and e-cigarettes. If you need help quitting, ask your health care provider. You may shower after after removal of dressings, but Do not take baths, swim, or use a hot tub for 1 week.  Do not drink alcohol for 24 hours after your procedure. Keep all follow-up visits as told by your health care provider. This is important. Contact a health care provider if: You have redness, mild swelling, or pain around your puncture site. You have fluid or blood coming from your puncture site that stops after applying firm pressure to the area. Your puncture site feels warm to the touch. You have pus or a bad smell coming from your puncture site. You have a fever. You have chest pain or discomfort that spreads to your neck, jaw, or arm. You have chest pain that is worse with lying on your back or taking a deep breath. You are sweating a lot. You feel nauseous. You have a fast or irregular heartbeat. You have shortness of breath. You are dizzy or light-headed and feel the need to lie down. You have pain or numbness in the arm or leg closest to your puncture site. Get help right away if: Your puncture site suddenly swells. Lie flat, apply firm pressure for 15 minutes. If swelling continues CALL 911 Your puncture site is bleeding. Lie flat, apply pressure for 15 minutes. If the bleeding does not stop after applying firm pressure to the area. CALL 911 These symptoms may represent a serious problem that is an emergency. Do not wait  to see if the symptoms will go away. Get medical help right away. Call your local emergency services (911 in the [place] Do not drive yourself to the hospital. Summary After the procedure, it is normal to have bruising and tenderness at the puncture site in your groin, neck, or forearm. Check your puncture site every day for signs of infection. Get help right away if your puncture site is bleeding and the bleeding  does not stop after applying firm pressure to the area. This is a medical emergency. This information is not intended to replace advice given to you by your health care provider. Make sure you discuss any questions you have with your health care provider.

## 2023-03-26 NOTE — H&P (Signed)
Electrophysiology Office Follow up Visit Note:     Date:  03/26/2023    ID:  Michelle, Johnston 04-Apr-1950, MRN [id number]   PCP:  Lupita Raider, MD   [facility] Cardiologist:  Little Ishikawa, MD  [facility] Electrophysiologist:  Lanier Prude, MD      Interval History:       Michelle Johnston is a 73 y.o. female who presents for a follow up visit.    I last saw the patient December 16, 2022 for her persistent atrial fibrillation.  She has a history of prior A-fib ablation in June 2022 during which the veins and posterior wall were isolated.  She had a watchman implanted August 07, 2021.  She is RNA in August 2024.  A heart monitor in May of this year showed 100% A-fib burden.  She is symptomatic while in atrial fibrillation with fatigue.   We previously discussed using a loop recorder for better characterization of her A-fib burden.     Today she tells me she has experienced more AF episodes that are symptomatic with dyspnea. They last at least 1 day. She is not currently on an [facility] given history of Watchman.    Presents for AF ablation. Procedure reviewed.  Past medical, surgical, social and family history were reviewed.   ROS:   Please see the history of present illness.    All other systems reviewed and are negative.   EKGs/Labs/Other Studies Reviewed:     The following studies were reviewed today:     EKG Interpretation Date/Time:                  Wednesday February 03 2023 16:39:15 EST Ventricular Rate:         63 PR Interval:                 238 QRS Duration:             104 QT Interval:                 444 QTC Calculation:454 R Axis:                         -50   Text Interpretation:Sinus rhythm with 1st degree A-V block Incomplete right bundle branch block Left anterior fascicular block Confirmed by Steffanie Dunn [phone]) on 02/03/2023 8:09:39 PM     Physical Exam:     VS:  BP (!) 149/69   Pulse 60   Ht 5\' 5"  (1.651 m)   Wt 147 lb (66.7 kg)   SpO2  97%   BMI 24.46 kg/m           Wt Readings from Last 3 Encounters:  02/03/23 147 lb (66.7 kg)  01/12/23 147 lb (66.7 kg)  12/16/22 150 lb (68 kg)      GEN: no distress CARD: RRR, No MRG RESP: No IWOB. CTAB.     Assessment ASSESSMENT:     1. Paroxysmal atrial fibrillation (HCC)   2. Primary hypertension     PLAN:     In order of problems listed above:   Assessment and Plan   #Atrial Fibrillation Symptomatic. Recurrent despite prior ablation in 2022. I have discussed treatment options including antiarrhythmic drugs and redo catheter ablation and she would like to pursue redo catheter ablation.   Discussed treatment options today for AF including antiarrhythmic drug therapy and ablation. Discussed risks, recovery and likelihood of success  with each treatment strategy. Risk, benefits, and alternatives to EP study and ablation for afib were discussed. These risks include but are not limited to stroke, bleeding, vascular damage, tamponade, perforation, damage to the esophagus, lungs, phrenic nerve and other structures, pulmonary vein stenosis, worsening renal function, coronary vasospasm and death.  Discussed potential need for repeat ablation procedures and antiarrhythmic drugs after an initial ablation. The patient understands these risk and wishes to proceed.  We will therefore proceed with catheter ablation at the next available time.  Carto, ICE, anesthesia are requested for the procedure.  Will also obtain CT PV protocol prior to the procedure to exclude LAA thrombus and further evaluate atrial anatomy.   She will need to start her Xarelto 1 week prior to the ablation with plans to continue for 3 months after the procedure.      #Hypertension Above goal today.  Recommend checking blood pressures 1-2 times per week at home and recording the values.  Recommend bringing these recordings to the primary care physician.     Presents for AF ablation. Procedure reviewed.            Signed, Steffanie Dunn, MD, [facility], [facility] 03/26/2023 Electrophysiology [facility]

## 2023-03-26 NOTE — Anesthesia Procedure Notes (Signed)
Procedure Name: Intubation Date/Time: 03/26/2023 10:08 AM  Performed by: Venia Carbon, CRNAPre-anesthesia Checklist: Patient identified, Emergency Drugs available, Suction available, Patient being monitored and Timeout performed Patient Re-evaluated:Patient Re-evaluated prior to induction Oxygen Delivery Method: Circle system utilized Preoxygenation: Pre-oxygenation with 100% oxygen Induction Type: IV induction Ventilation: Mask ventilation without difficulty Laryngoscope Size: Mac and 3 Grade View: Grade I Tube type: Oral Tube size: 7.0 mm Number of attempts: 1 Placement Confirmation: ETT inserted through vocal cords under direct vision, positive ETCO2, CO2 detector and breath sounds checked- equal and bilateral Secured at: 22 cm Tube secured with: Tape

## 2023-03-26 NOTE — Transfer of Care (Signed)
Immediate Anesthesia Transfer of Care Note  Patient: [name]  Procedure(s) Performed: ATRIAL FIBRILLATION ABLATION  Patient Location: Cath Lab  Anesthesia Type:General  Level of Consciousness: awake, alert , oriented, and patient cooperative  Airway & Oxygen Therapy: Patient Spontanous Breathing  Post-op Assessment: Report given to RN, Post -op Vital signs reviewed and stable, Patient moving all extremities, Patient moving all extremities X 4, and Patient able to stick tongue midline  Post vital signs: Reviewed and stable  Last Vitals:  Vitals Value Taken Time  BP 112/76 03/26/23 1135  Temp    Pulse 66 03/26/23 1136  Resp 20 03/26/23 1136  SpO2 92 % 03/26/23 1136  Vitals shown include unfiled device data.  Last Pain:  Vitals:   03/26/23 0840  TempSrc: Oral  PainSc: 0-No pain         Complications: No notable events documented.

## 2023-03-26 NOTE — Anesthesia Preprocedure Evaluation (Signed)
Anesthesia Evaluation  Patient identified by MRN, date of birth, ID band Patient awake    Reviewed: Allergy & Precautions, NPO status , Patient's Chart, lab work & pertinent test results, reviewed documented beta blocker date and time   History of Anesthesia Complications Negative for: history of anesthetic complications  Airway Mallampati: II  TM Distance: >3 FB     Dental no notable dental hx.    Pulmonary shortness of breath and with exertion, sleep apnea and Continuous Positive Airway Pressure Ventilation , neg COPD, former smoker   breath sounds clear to auscultation       Cardiovascular hypertension, + Peripheral Vascular Disease  (-) CAD, (-) Past MI and (-) Cardiac Stents + dysrhythmias Atrial Fibrillation + Valvular Problems/Murmurs AI  Rhythm:Regular Rate:Normal + Diastolic murmurs    Neuro/Psych neg Seizures PSYCHIATRIC DISORDERS Anxiety Depression    TIA   GI/Hepatic ,neg GERD  ,,(+) neg Cirrhosis        Endo/Other    Renal/GU Renal disease     Musculoskeletal  (+) Arthritis ,    Abdominal   Peds  Hematology   Anesthesia Other Findings   Reproductive/Obstetrics                             Anesthesia Physical Anesthesia Plan  ASA: 3  Anesthesia Plan: General   Post-op Pain Management:    Induction: Intravenous  PONV Risk Score and Plan: 2 and Ondansetron and Dexamethasone  Airway Management Planned: Oral ETT  Additional Equipment:   Intra-op Plan:   Post-operative Plan: Extubation in OR  Informed Consent: I have reviewed the patients History and Physical, chart, labs and discussed the procedure including the risks, benefits and alternatives for the proposed anesthesia with the patient or authorized representative who has indicated his/her understanding and acceptance.     Dental advisory given  Plan Discussed with: CRNA  Anesthesia Plan Comments:         Anesthesia Quick Evaluation

## 2023-03-26 NOTE — Anesthesia Postprocedure Evaluation (Signed)
Anesthesia Post Note  Patient: Michelle Johnston  Procedure(s) Performed: ATRIAL FIBRILLATION ABLATION     Patient location during evaluation: PACU Anesthesia Type: General Level of consciousness: awake and alert Pain management: pain level controlled Vital Signs Assessment: post-procedure vital signs reviewed and stable Respiratory status: spontaneous breathing, nonlabored ventilation, respiratory function stable and patient connected to nasal cannula oxygen Cardiovascular status: blood pressure returned to baseline and stable Postop Assessment: no apparent nausea or vomiting Anesthetic complications: no   There were no known notable events for this encounter.  Last Vitals:  Vitals:   03/26/23 0840 03/26/23 1143  BP: (!) 149/69 121/65  Pulse: 60 63  Resp: 14 14  Temp: 36.7 C 36.5 C  SpO2: 98% 91%    Last Pain:  Vitals:   03/26/23 1143  TempSrc: Oral  PainSc:                  Mariann Barter

## 2023-03-29 ENCOUNTER — Other Ambulatory Visit (INDEPENDENT_AMBULATORY_CARE_PROVIDER_SITE_OTHER): Payer: Medicare HMO

## 2023-03-29 ENCOUNTER — Ambulatory Visit: Payer: Medicare HMO | Admitting: Physician Assistant

## 2023-03-29 ENCOUNTER — Encounter (HOSPITAL_COMMUNITY): Payer: Self-pay | Admitting: Cardiology

## 2023-03-29 DIAGNOSIS — M1711 Unilateral primary osteoarthritis, right knee: Secondary | ICD-10-CM

## 2023-03-29 NOTE — Progress Notes (Signed)
HPI: [name] returns today status post right knee injection with cortisone on 02/22/2023.  States she got no relief.  She is having constant pain in the knee particularly if she is standing for long period time.  Pains in the lateral aspect of the knee.  Also around the patellar region.  Feels as if the knee gives way and locks.  She has tried Tylenol gabapentin meloxicam.  She has recently underwent cardiac ablation.  She is on Xarelto.  She understands the risks of taking Xarelto and the meloxicam.  Review of systems: See HPI otherwise negative  Physical exam: General pleasant female in no acute distress. Right knee: Good range of motion.  Tenderness along the lateral joint line.  Positive effusion.  Radiographs: Right knee 2 views: Knee is well located.  No acute fractures acute findings.  Lateral compartment with near bone-on-bone and osteophytes off the lateral margin.  Medial joint line overall well-preserved.  Patellofemoral arthritis moderate.  No other bony abnormalities.  Impression: Right knee osteoarthritis  Plan: Given the fact that she is unable to undergo any type of surgical intervention for at least 3 months due to her cardiac ablation recommend viscosupplementation.  Again she has failed conservative treatment which has included NSAIDs, cortisone injections.  Have her follow-up once Visco supplementation is available.  Questions were encouraged and answered at length.

## 2023-03-30 ENCOUNTER — Telehealth: Payer: Self-pay | Admitting: Radiology

## 2023-03-30 MED FILL — Fentanyl Citrate Preservative Free (PF) Inj 100 MCG/2ML: INTRAMUSCULAR | Qty: 2 | Status: AC

## 2023-03-30 MED FILL — Cefazolin Sodium-Dextrose IV Solution 2 GM/100ML-4%: INTRAVENOUS | Qty: 100 | Status: AC

## 2023-03-30 NOTE — Telephone Encounter (Signed)
RT KNEE VISCO INJECTION

## 2023-04-01 ENCOUNTER — Ambulatory Visit: Payer: Medicare HMO | Admitting: Physician Assistant

## 2023-04-08 NOTE — Telephone Encounter (Addendum)
 VOB submitted for Monovisc, right knee

## 2023-04-14 ENCOUNTER — Other Ambulatory Visit: Payer: Self-pay | Admitting: Cardiology

## 2023-04-15 ENCOUNTER — Ambulatory Visit (INDEPENDENT_AMBULATORY_CARE_PROVIDER_SITE_OTHER): Payer: Medicare HMO | Admitting: Physician Assistant

## 2023-04-15 ENCOUNTER — Other Ambulatory Visit: Payer: Self-pay | Admitting: Family Medicine

## 2023-04-15 ENCOUNTER — Other Ambulatory Visit: Payer: Self-pay

## 2023-04-15 DIAGNOSIS — R1031 Right lower quadrant pain: Secondary | ICD-10-CM | POA: Diagnosis not present

## 2023-04-15 DIAGNOSIS — M1711 Unilateral primary osteoarthritis, right knee: Secondary | ICD-10-CM | POA: Diagnosis not present

## 2023-04-15 DIAGNOSIS — E7800 Pure hypercholesterolemia, unspecified: Secondary | ICD-10-CM | POA: Diagnosis not present

## 2023-04-15 DIAGNOSIS — M179 Osteoarthritis of knee, unspecified: Secondary | ICD-10-CM | POA: Diagnosis not present

## 2023-04-15 DIAGNOSIS — R109 Unspecified abdominal pain: Secondary | ICD-10-CM

## 2023-04-15 DIAGNOSIS — M1712 Unilateral primary osteoarthritis, left knee: Secondary | ICD-10-CM

## 2023-04-15 DIAGNOSIS — I4891 Unspecified atrial fibrillation: Secondary | ICD-10-CM | POA: Diagnosis not present

## 2023-04-15 DIAGNOSIS — F331 Major depressive disorder, recurrent, moderate: Secondary | ICD-10-CM | POA: Diagnosis not present

## 2023-04-15 DIAGNOSIS — I10 Essential (primary) hypertension: Secondary | ICD-10-CM | POA: Diagnosis not present

## 2023-04-15 DIAGNOSIS — M4800 Spinal stenosis, site unspecified: Secondary | ICD-10-CM | POA: Diagnosis not present

## 2023-04-15 DIAGNOSIS — J309 Allergic rhinitis, unspecified: Secondary | ICD-10-CM | POA: Diagnosis not present

## 2023-04-15 DIAGNOSIS — K219 Gastro-esophageal reflux disease without esophagitis: Secondary | ICD-10-CM | POA: Diagnosis not present

## 2023-04-15 MED ORDER — HYALURONAN 88 MG/4ML IX SOSY
88.0000 mg | PREFILLED_SYRINGE | INTRA_ARTICULAR | Status: AC | PRN
  Administered 2023-04-15: 88 mg via INTRA_ARTICULAR

## 2023-04-15 NOTE — Progress Notes (Signed)
   Procedure Note  Patient: Michelle Johnston             Date of Birth: 11-01-1950           MRN: [id number]             Visit Date: 04/15/2023 HPI: Michelle Johnston comes in today for scheduled right knee Monovisc injection.  She has known osteoarthritis of her right knee.  She has failed cortisone injections.  She has recurrent effusions in the knee.  She has no scheduled surgery on the right knee in the next 6 months.  She is also tried NSAIDs and exercise.  Currently unable to take NSAIDs due to Xarelto.   Review of systems: Negative for fevers chills.  No new injury to the knee.  Physical exam: Right knee: No abnormal warmth erythema.  Good range of motion.  Positive effusion.  Procedures: Visit Diagnoses: Right knee osteoarthritis  Large Joint Inj on 04/15/2023 5:00 PM Indications: pain Details: 22 G 1.5 in needle, superolateral approach  Arthrogram: No  Medications: 88 mg Hyaluronan 88 MG/4ML Aspirate: 67 mL yellow Outcome: tolerated well, no immediate complications Procedure, treatment alternatives, risks and benefits explained, specific risks discussed. Consent was given by the patient. Immediately prior to procedure a time out was called to verify the correct patient, procedure, equipment, support staff and site/side marked as required. Patient was prepped and draped in the usual sterile fashion.     Plan: Will see him in 6 weeks to see what type of response she had to the injection.  Questions were encouraged and answered at length.

## 2023-04-19 ENCOUNTER — Ambulatory Visit: Payer: Medicare HMO | Admitting: Physician Assistant

## 2023-04-21 ENCOUNTER — Ambulatory Visit
Admission: RE | Admit: 2023-04-21 | Discharge: 2023-04-21 | Disposition: A | Discharge status: Home / Self Care | Payer: Medicare HMO | Source: Ambulatory Visit | Attending: Family Medicine | Admitting: Family Medicine

## 2023-04-21 DIAGNOSIS — R109 Unspecified abdominal pain: Secondary | ICD-10-CM

## 2023-04-21 MED ORDER — IOPAMIDOL (ISOVUE-300) INJECTION 61%
100.0000 mL | Freq: Once | INTRAVENOUS | Status: AC | PRN
  Administered 2023-04-21: 100 mL via INTRAVENOUS

## 2023-04-23 ENCOUNTER — Ambulatory Visit (HOSPITAL_COMMUNITY): Payer: Medicare HMO | Admitting: Physician Assistant

## 2023-04-26 ENCOUNTER — Ambulatory Visit (HOSPITAL_COMMUNITY)
Admission: RE | Admit: 2023-04-26 | Discharge: 2023-04-26 | Disposition: A | Discharge status: Home / Self Care | Payer: Medicare HMO | Source: Ambulatory Visit | Attending: Physician Assistant | Admitting: Physician Assistant

## 2023-04-26 VITALS — BP 144/66 | HR 63 | Ht 65.0 in | Wt 148.4 lb

## 2023-04-26 DIAGNOSIS — I4819 Other persistent atrial fibrillation: Secondary | ICD-10-CM

## 2023-04-26 DIAGNOSIS — D6869 Other thrombophilia: Secondary | ICD-10-CM | POA: Diagnosis not present

## 2023-04-26 DIAGNOSIS — I340 Nonrheumatic mitral (valve) insufficiency: Secondary | ICD-10-CM | POA: Insufficient documentation

## 2023-04-26 DIAGNOSIS — I10 Essential (primary) hypertension: Secondary | ICD-10-CM | POA: Insufficient documentation

## 2023-04-26 DIAGNOSIS — Z7901 Long term (current) use of anticoagulants: Secondary | ICD-10-CM | POA: Insufficient documentation

## 2023-04-26 DIAGNOSIS — Z87891 Personal history of nicotine dependence: Secondary | ICD-10-CM | POA: Diagnosis not present

## 2023-04-26 DIAGNOSIS — G4733 Obstructive sleep apnea (adult) (pediatric): Secondary | ICD-10-CM | POA: Insufficient documentation

## 2023-04-26 DIAGNOSIS — I739 Peripheral vascular disease, unspecified: Secondary | ICD-10-CM | POA: Insufficient documentation

## 2023-04-26 DIAGNOSIS — I452 Bifascicular block: Secondary | ICD-10-CM | POA: Diagnosis not present

## 2023-04-26 DIAGNOSIS — I4892 Unspecified atrial flutter: Secondary | ICD-10-CM | POA: Insufficient documentation

## 2023-04-26 DIAGNOSIS — Z79899 Other long term (current) drug therapy: Secondary | ICD-10-CM | POA: Insufficient documentation

## 2023-04-26 NOTE — Progress Notes (Signed)
 Primary Care Physician: Lupita Raider, MD Primary Cardiologist: Dr Bjorn Pippin Primary Electrophysiologist:  Dr. Lalla Brothers  Referring Physician: Dr Tiburcio Pea Michelle Johnston is a 73 y.o. female with a history of atrial fibrillation, HTN, PAD, moderate MR, moderate AI, tobacco use who presents for follow up in the [facility]. She underwent DCCV on 08/15/19 but was back in afib on follow up one week later. Patient was started on flecainide but developed dizziness and vision blurring and this was discontinued. S/p afib ablation performed by Dr. Lalla Brothers 08/12/20. She is also s/p Watchman implant 08/07/21. She did well but had recurrent afib. Cardiac monitor 08/2022 showed 100% afib burden. She underwent repeat ablation on 03/26/23.  Patient returns for follow up for atrial fibrillation. She reports that she has done well since the ablation with no interim symptoms of afib. She denies chest pain or groin issues. No bleeding issues on anticoagulation.   Today, he denies symptoms of palpitations, chest pain, shortness of breath, orthopnea, PND, lower extremity edema, dizziness, presyncope, syncope, snoring, daytime somnolence, bleeding, or neurologic sequela. The patient is tolerating medications without difficulties and is otherwise without complaint today.    Atrial Fibrillation Risk Factors:  she does have symptoms or diagnosis of sleep apnea. she does have a history of rheumatic fever. she does not have a history of alcohol use. The patient does not have a history of early familial atrial fibrillation or other arrhythmias.   Atrial Fibrillation Management history:  Previous antiarrhythmic drugs: flecainide Previous cardioversions: 08/15/19 Previous ablations: 08/12/20, 03/26/23 Anticoagulation history: Xarelto   Past Medical History:  Diagnosis Date   Anxiety    Arthritis    Bradycardia    Bronchitis    Depression    Dyspnea    Glaucoma    Hypertension     Presence of Watchman left atrial appendage closure device 08/07/2021   Watchman FLX 27mm with Dr. Lalla Brothers   Seasonal allergies    Sleep apnea    Wears glasses     Current Outpatient Medications  Medication Sig Dispense Refill   acetaminophen (TYLENOL) 500 MG tablet Take 1,000 mg by mouth every 6 (six) hours as needed for moderate pain (pain score 4-6).     albuterol (VENTOLIN HFA) 108 (90 Base) MCG/ACT inhaler Inhale 1-2 puffs into the lungs every 6 (six) hours as needed for wheezing or shortness of breath.     amLODipine (NORVASC) 10 MG tablet Take 10 mg by mouth at bedtime.     b complex vitamins tablet Take 1 tablet by mouth daily.     calcium carbonate (OS-CAL - DOSED IN MG OF ELEMENTAL CALCIUM) 1250 (500 Ca) MG tablet Take 1 tablet by mouth daily with breakfast.     carvedilol (COREG) 6.25 MG tablet TAKE 1 TABLET BY MOUTH TWICE DAILY WITH A MEAL 180 tablet 3   cetirizine (ZYRTEC) 10 MG tablet Take 10 mg by mouth at bedtime.     Cholecalciferol (VITAMIN D) 50 MCG (2000 UT) tablet Take 2,000 Units by mouth daily.     cyanocobalamin (VITAMIN B12) 1000 MCG/ML injection Inject 1,000 mcg into the muscle every Sunday.     FLUoxetine (PROZAC) 40 MG capsule Take 40 mg by mouth daily.     gabapentin (NEURONTIN) 300 MG capsule Take 300-600 mg by mouth See admin instructions. Take 300 mg in the morning and 600 mg at night     guaiFENesin (MUCINEX) 600 MG 12 hr tablet Take 600 mg by mouth  daily as needed for cough or to loosen phlegm.     ipratropium (ATROVENT) 0.06 % nasal spray Place 1 spray into both nostrils daily as needed for rhinitis.     latanoprost (XALATAN) 0.005 % ophthalmic solution Place 1 drop into both eyes every morning.      meloxicam (MOBIC) 15 MG tablet Take 7.5 mg by mouth daily as needed for pain.     methylphenidate (RITALIN) 10 MG tablet Take 5 mg by mouth daily.     montelukast (SINGULAIR) 10 MG tablet Take 10 mg by mouth daily as needed (allergies).     Multiple  Vitamins-Minerals (MULTIVITAMIN WITH MINERALS) tablet Take 1 tablet by mouth daily.     nitroGLYCERIN (NITROSTAT) 0.4 MG SL tablet Place 0.4 mg under the tongue every 5 (five) minutes as needed for chest pain.     Omega-3 Fatty Acids (FISH OIL) 1000 MG CAPS Take 1,000 mg by mouth daily.     omeprazole (PRILOSEC) 20 MG capsule Take 20 mg by mouth daily as needed (acid reflux).     rivaroxaban (XARELTO) 20 MG TABS tablet Take 1 tablet (20 mg total) by mouth daily with supper. 90 tablet 3   rosuvastatin (CRESTOR) 20 MG tablet Take 1 tablet by mouth once daily 90 tablet 2   vitamin C (ASCORBIC ACID) 500 MG tablet Take 500 mg by mouth daily.     No current facility-administered medications for this encounter.    ROS- All systems are reviewed and negative except as per the HPI above.  Physical Exam: Vitals:   04/26/23 1321  BP: (!) 144/66  Pulse: 63  Weight: 67.3 kg  Height: 5\' 5"  (1.651 m)     GEN: Well nourished, well developed in no acute distress CARDIAC: Regular rate and rhythm, no murmurs, rubs, gallops RESPIRATORY:  Clear to auscultation without rales, wheezing or rhonchi  ABDOMEN: Soft, non-tender, non-distended EXTREMITIES:  No edema; No deformity    Wt Readings from Last 3 Encounters:  04/26/23 67.3 kg  03/26/23 68 kg  02/05/23 66.7 kg    EKG today demonstrates  SR, 1st degree AV block, LAFB Vent. rate 63 BPM PR interval 266 ms QRS duration 110 ms QT/QTcB 448/458 ms   Echo  11/09/22  1. Basal inferior hypokinesis. . Left ventricular ejection fraction, by  estimation, is 55 to 60%. The left ventricle has normal function. The left  ventricle has no regional wall motion abnormalities. There is mild left  ventricular hypertrophy. Left ventricular diastolic parameters are indeterminate.   2. Right ventricular systolic function is normal. The right ventricular  size is normal.   3. Left atrial size was severely dilated.   4. The mitral valve is normal in structure.  Mild mitral valve  regurgitation.   5. Compared to echo from May 2023, AI is without significant change. The  aortic valve is tricuspid. Aortic valve regurgitation is moderate to  severe.   6. The inferior vena cava is normal in size with greater than 50%  respiratory variability, suggesting right atrial pressure of 3 mmHg.    Epic records are reviewed at length today   CHA2DS2-VASc Score = 5  The patient's score is based upon: CHF History: 0 HTN History: 1 Diabetes History: 0 Stroke History: 0 Vascular Disease History: 1 Age Score: 2 Gender Score: 1       ASSESSMENT AND PLAN: Persistent Atrial Fibrillation/atrial flutter The patient's CHA2DS2-VASc score is 5, indicating a 7.2% annual risk of stroke.   S/p afib  ablation 08/12/20 and repeat ablation 03/26/23. Multiple atrial flutter circuits noted on second ablation, medical therapy recommended in she has more afib or flutter. However, patient is clear that she does not want to take an AAD due to concern about side effects.  Continue carvedilol 6.25 mg BID Continue Xarelto 20 mg daily for now. Plan to discontinue at 3 months post procedure. Previous Watchman implant.   Secondary Hypercoagulable State (ICD10:  D68.69) The patient is at significant risk for stroke/thromboembolism based upon her CHA2DS2-VASc Score of 5.  Continue Rivaroxaban (Xarelto). S/p Watchman implant 08/07/21  HTN Stable on current regimen  VHD Mild MR Moderate to severe AI Followed by Dr Bjorn Pippin  OSA  Patient scheduled for consult with Dr Maple Hudson at [facility] 3/4.   Follow up with Otilio Saber as scheduled.     Jorja Loa PA-C [facility] [address] [place], [place] [address] [phone]

## 2023-05-02 NOTE — Progress Notes (Unsigned)
 05/04/23- 73 yoF self-referred for OSA,  Interested in Pillar palatal implant procedure for OSA (ENT referral) Medical problem list includes  OSA, AFib/ Watchman, PAD, Mitral Regurg, Aortic Regurg, HTN, Glaucoma, NPSG 05/14/20- AHI 11.2/hr, desat to 87%, body weight 150 lbs Epworth score-8 Body weight today 150 lbs. Discussed the use of AI scribe software for clinical note transcription with the patient, who gave verbal consent to proceed. History of Present Illness       We will get another Earnest Bailey opinion, although likely consensus is that she will be considered too mild for this procedure. She is not very impacted by her OSA, except for the AFib issue, and understand that conservative management may be her best option. The patient, with a history of atrial fibrillation (AFib) and sleep apnea, presents for consultation regarding alternative treatments for sleep apnea. She previously attempted CPAP therapy but discontinued due to intolerable side effects including severe headaches and sleep disruption. She also sought consultation for Inspire therapy but was deemed not a suitable candidate due to the mild severity of her sleep apnea.  The patient's sleep apnea was initially diagnosed following complaints of snoring and the diagnosis of AFib. She reports daytime sleepiness, particularly when sitting for extended periods. However, her spouse has recently noted a decrease in her snoring.  In addition to AFib and sleep apnea, the patient has a history of sinus surgeries and currently has a tube in her ear. She also reports a leaky heart valve and is currently on Xarelto following a recent ablation procedure for AFib. Other medications include gabapentin,  and a half dose of meloxicam.  Prior to Admission medications   Medication Sig Start Date End Date Taking? Authorizing Provider  acetaminophen (TYLENOL) 500 MG tablet Take 1,000 mg by mouth every 6 (six) hours as needed for moderate pain (pain score  4-6).   Yes [provider]  albuterol (VENTOLIN HFA) 108 (90 Base) MCG/ACT inhaler Inhale 1-2 puffs into the lungs every 6 (six) hours as needed for wheezing or shortness of breath.   Yes [provider]  amLODipine (NORVASC) 10 MG tablet Take 10 mg by mouth at bedtime.   Yes [provider]  b complex vitamins tablet Take 1 tablet by mouth daily.   Yes [provider]  calcium carbonate (OS-CAL - DOSED IN MG OF ELEMENTAL CALCIUM) 1250 (500 Ca) MG tablet Take 1 tablet by mouth daily with breakfast.   Yes [provider]  carvedilol (COREG) 6.25 MG tablet TAKE 1 TABLET BY MOUTH TWICE DAILY WITH A MEAL 02/18/23  Yes Lanier Prude, MD  cetirizine (ZYRTEC) 10 MG tablet Take 10 mg by mouth at bedtime.   Yes [provider]  Cholecalciferol (VITAMIN D) 50 MCG (2000 UT) tablet Take 2,000 Units by mouth daily.   Yes [provider]  cyanocobalamin (VITAMIN B12) 1000 MCG/ML injection Inject 1,000 mcg into the muscle every Sunday. 05/29/22  Yes [provider]  FLUoxetine (PROZAC) 40 MG capsule Take 40 mg by mouth daily. 02/21/20  Yes [provider]  gabapentin (NEURONTIN) 300 MG capsule Take 300-600 mg by mouth See admin instructions. Take 300 mg in the morning and 600 mg at night 04/19/20  Yes [provider]  guaiFENesin (MUCINEX) 600 MG 12 hr tablet Take 600 mg by mouth daily as needed for cough or to loosen phlegm.   Yes [provider]  ipratropium (ATROVENT) 0.06 % nasal spray Place 1 spray into both nostrils daily as needed for  rhinitis. 09/12/19  Yes [provider]  latanoprost (XALATAN) 0.005 % ophthalmic solution Place 1 drop into both eyes every morning.    Yes [provider]  meloxicam (MOBIC) 15 MG tablet Take 7.5 mg by mouth daily as needed for pain.   Yes [provider]  methylphenidate (RITALIN) 10 MG tablet Take 5 mg by mouth daily. 12/15/21  Yes [provider]  montelukast (SINGULAIR) 10 MG tablet Take 10 mg by mouth daily as needed (allergies).   Yes [provider]  Multiple Vitamins-Minerals (MULTIVITAMIN WITH MINERALS) tablet Take 1 tablet by mouth daily.   Yes [provider]  nitroGLYCERIN (NITROSTAT) 0.4 MG SL tablet Place 0.4 mg under the tongue every 5 (five) minutes as needed for chest pain.   Yes [provider]  Omega-3 Fatty Acids (FISH OIL) 1000 MG CAPS Take 1,000 mg by mouth daily.   Yes [provider]  omeprazole (PRILOSEC) 20 MG capsule Take 20 mg by mouth daily as needed (acid reflux). 12/14/19  Yes [provider]  rivaroxaban (XARELTO) 20 MG TABS tablet Take 1 tablet (20 mg total) by mouth daily with supper. 03/15/23  Yes Lanier Prude, MD  rosuvastatin (CRESTOR) 20 MG tablet Take 1 tablet by mouth once daily 04/14/23  Yes Little Ishikawa, MD  vitamin C (ASCORBIC ACID) 500 MG tablet Take 500 mg by mouth daily.   Yes [provider]   Past Medical History:  Diagnosis Date   Anxiety    Arthritis    Bradycardia    Bronchitis    Depression    Dyspnea    Glaucoma    Hypertension    Presence of Watchman left atrial appendage closure device 08/07/2021   Watchman FLX 27mm with Dr. Lalla Brothers   Seasonal allergies    Sleep apnea    Wears glasses    Past Surgical History:  Procedure Laterality Date   ABDOMINAL HYSTERECTOMY  1990   ATRIAL FIBRILLATION ABLATION N/A 08/12/2020   Procedure: ATRIAL FIBRILLATION ABLATION;  Surgeon: Lanier Prude, MD;  Location: [facility];  Service: Cardiovascular;  Laterality: N/A;   ATRIAL FIBRILLATION ABLATION N/A 03/26/2023   Procedure: ATRIAL FIBRILLATION ABLATION;  Surgeon: Lanier Prude, MD;  Location: [facility];  Service: Cardiovascular;  Laterality: N/A;   BREAST BIOPSY Right    benign   CARDIOVERSION N/A 08/15/2019   Procedure: CARDIOVERSION;  Surgeon: Elder Negus, MD;  Location:  [facility];  Service: Cardiovascular;  Laterality: N/A;   COLONOSCOPY     LEFT ATRIAL APPENDAGE OCCLUSION N/A 08/07/2021   Procedure: LEFT ATRIAL APPENDAGE OCCLUSION;  Surgeon: Lanier Prude, MD;  Location: [facility];  Service: Cardiovascular;  Laterality: N/A;   meniscus tear knee     left knee   NASAL SINUS SURGERY  1975   ORIF ANKLE FRACTURE  2009   left   TEE WITHOUT CARDIOVERSION N/A 08/07/2021   Procedure: TRANSESOPHAGEAL ECHOCARDIOGRAM (TEE);  Surgeon: Lanier Prude, MD;  Location: [facility] INVAS[facility] CV LAB;  Service: Cardiovascular;  Laterality: N/A;   TOTAL KNEE ARTHROPLASTY Left 02/03/2019   Procedure: LEFT TOTAL KNEE ARTHROPLASTY;  Surgeon: Kathryne Hitch, MD;  Location: [facility];  Service: Orthopedics;  Laterality: Left;   TURBINATE REDUCTION Bilateral 06/04/2014   Procedure: BILATERAL TURBINATE REDUCTION;  Surgeon: Newman Pies, MD;  Location: [facility];  Service: ENT;  Laterality: Bilateral;   TYMPANOSTOMY TUBE PLACEMENT     left  Family History  Problem Relation Age of Onset   Hypertension Mother    Hyperlipidemia Mother    Lymphoma Mother    Lung cancer Father    Social History   Socioeconomic History   Marital status: Married    Spouse name: Not on file   Number of children: 0   Years of education: Not on file   Highest education level: Not on file  Occupational History   Not on file  Tobacco Use   Smoking status: Former    Current packs/day: 0.00    Average packs/day: 2.0 packs/day for 30.0 years (60.0 ttl pk-yrs)    Types: Cigarettes    Start date: 05/27/1968    Quit date: 05/28/1998    Years since quitting: 24.9   Smokeless tobacco: Never  Vaping Use   Vaping status: Never Used  Substance and Sexual Activity   Alcohol use: Yes    Alcohol/week: 3.0 - 4.0 standard drinks of alcohol    Types: 2 Glasses of wine, 1 - 2 Cans of beer per week    Comment: daily-wine daily-1 glass   Drug use: No   Sexual activity: Not on file   Other Topics Concern   Not on file  Social History Narrative   Not on file   Social Drivers of Health   Financial Resource Strain: Not on file  Food Insecurity: Not on file  Transportation Needs: Not on file  Physical Activity: Not on file  Stress: Not on file  Social Connections: Unknown (07/15/2021)   Received from [facility]   Social Network    Social Network: Not on file  Intimate Partner Violence: Unknown (06/06/2021)   Received from [facility]   HITS    Physically Hurt: Not on file    Insult or Talk Down To: Not on file    Threaten Physical Harm: Not on file    Scream or Curse: Not on file   ROS-see HPI   + = positive Constitutional:    weight loss, night sweats, fevers, chills, fatigue, lassitude. HEENT:    headaches, difficulty swallowing, tooth/dental problems, sore throat,       sneezing, itching, ear ache, nasal congestion, post nasal drip, snoring CV:    chest pain, orthopnea, PND, swelling in lower extremities, anasarca,                                   dizziness, palpitations Resp:   +shortness of breath with exertion or at rest.                productive cough,   non-productive cough, coughing up of blood.              change in color of mucus.  wheezing.   Skin:    rash or lesions. GI:  +heartburn, indigestion, abdominal pain, nausea, vomiting, diarrhea,                 change in bowel habits, +loss of appetite GU: dysuria, change in color of urine, no urgency or frequency.   flank pain. MS:   +joint pain, stiffness, decreased range of motion, back pain. Neuro-     nothing unusual Psych:  change in mood or affect.  +depression or anxiety.   memory loss.  OBJ- Physical Exam General- Alert, Oriented, Affect-appropriate, Distress- none acute Skin- rash-none, lesions- none, excoriation- none Lymphadenopathy- none Head- atraumatic  Eyes- Gross vision intact, PERRLA, conjunctivae and secretions clear             Ears- +tube L ear            Nose- Clear, no-Septal dev, mucus, polyps, erosion, perforation             Throat- Mallampati III , mucosa clear , drainage- none, tonsils- atrophic, +teeth Neck- flexible , trachea midline, no stridor , thyroid nl, carotid no bruit Chest - symmetrical excursion , unlabored           Heart/CV- RRR(rare extra) , +murmur 1/6 SEM , no gallop  , no rub, nl s1 s2                           - JVD- none , edema- none, stasis changes- none, varices- none           Lung- clear to P&A, wheeze- none, cough- none , dullness-none, rub- none           Chest wall-  Abd-  Br/ Gen/ Rectal- Not done, not indicated Extrem- cyanosis- none, clubbing, none, atrophy- none, strength- nl Neuro- grossly intact to observation  Assessment and Plan:    Obstructive Sleep Apnea Mild obstructive sleep apnea with 11 apneas per hour. CPAP intolerable. Not a candidate for Inspire per Dr. Jenne Pane. Daytime sleepiness persists. No current snoring, possibly due to recent cardiac ablation. Sleep apnea may increase atrial fibrillation risk. - Refer to Dr. Althea Grimmer for evaluation of a fitted mouthpiece. - Consider over-the-counter mouthpieces and chin straps. - Refer to a different ENT for Inspire procedure evaluation and consideration of palatal pillars.  Atrial Fibrillation- managed by cardiology Atrial fibrillation post-cardiac ablation. On Xarelto for anticoagulation. - Continue Xarelto as prescribed.

## 2023-05-04 ENCOUNTER — Encounter: Payer: Self-pay | Admitting: Internal Medicine

## 2023-05-04 ENCOUNTER — Ambulatory Visit: Payer: Medicare HMO | Admitting: Internal Medicine

## 2023-05-04 ENCOUNTER — Encounter (INDEPENDENT_AMBULATORY_CARE_PROVIDER_SITE_OTHER): Payer: Self-pay | Admitting: Otolaryngology

## 2023-05-04 VITALS — BP 130/70 | HR 62 | Temp 98.4°F | Ht 65.0 in | Wt 150.0 lb

## 2023-05-04 DIAGNOSIS — Z87891 Personal history of nicotine dependence: Secondary | ICD-10-CM

## 2023-05-04 DIAGNOSIS — I4891 Unspecified atrial fibrillation: Secondary | ICD-10-CM

## 2023-05-04 DIAGNOSIS — G4733 Obstructive sleep apnea (adult) (pediatric): Secondary | ICD-10-CM

## 2023-05-04 NOTE — Patient Instructions (Signed)
 Order- referral to orthodontist Dr Althea Grimmer  consider oral appliance for OSA  Order- referral to [facility] Dr Irene Pap   consider pillar procedure or Inspire for OSA  Please call if we can help

## 2023-05-06 DIAGNOSIS — M25551 Pain in right hip: Secondary | ICD-10-CM | POA: Diagnosis not present

## 2023-05-06 DIAGNOSIS — M5416 Radiculopathy, lumbar region: Secondary | ICD-10-CM | POA: Diagnosis not present

## 2023-05-11 DIAGNOSIS — R0602 Shortness of breath: Secondary | ICD-10-CM | POA: Diagnosis not present

## 2023-05-11 DIAGNOSIS — I10 Essential (primary) hypertension: Secondary | ICD-10-CM | POA: Diagnosis not present

## 2023-05-11 DIAGNOSIS — R062 Wheezing: Secondary | ICD-10-CM | POA: Diagnosis not present

## 2023-05-11 DIAGNOSIS — R0981 Nasal congestion: Secondary | ICD-10-CM | POA: Diagnosis not present

## 2023-05-11 DIAGNOSIS — I4891 Unspecified atrial fibrillation: Secondary | ICD-10-CM | POA: Diagnosis not present

## 2023-05-11 DIAGNOSIS — J42 Unspecified chronic bronchitis: Secondary | ICD-10-CM | POA: Diagnosis not present

## 2023-05-20 DIAGNOSIS — M25559 Pain in unspecified hip: Secondary | ICD-10-CM | POA: Diagnosis not present

## 2023-05-20 DIAGNOSIS — M545 Low back pain, unspecified: Secondary | ICD-10-CM | POA: Diagnosis not present

## 2023-05-27 ENCOUNTER — Ambulatory Visit: Payer: Medicare HMO | Admitting: Physician Assistant

## 2023-06-01 DIAGNOSIS — M47816 Spondylosis without myelopathy or radiculopathy, lumbar region: Secondary | ICD-10-CM | POA: Diagnosis not present

## 2023-06-01 DIAGNOSIS — M25551 Pain in right hip: Secondary | ICD-10-CM | POA: Diagnosis not present

## 2023-06-09 DIAGNOSIS — M5416 Radiculopathy, lumbar region: Secondary | ICD-10-CM | POA: Diagnosis not present

## 2023-06-12 IMAGING — MG MM DIGITAL SCREENING BILAT W/ TOMO AND CAD
8 series · 8 of 24 positions shown · non-contrast
Comparison: Previous exam(s).

CLINICAL DATA: Screening.

EXAM:
DIGITAL SCREENING BILATERAL MAMMOGRAM WITH TOMOSYNTHESIS AND CAD
TECHNIQUE: Bilateral screening digital craniocaudal and mediolateral oblique
mammograms were obtained. Bilateral screening digital breast
tomosynthesis was performed. The images were evaluated with
computer-aided detection.

[R CC synth-2D]
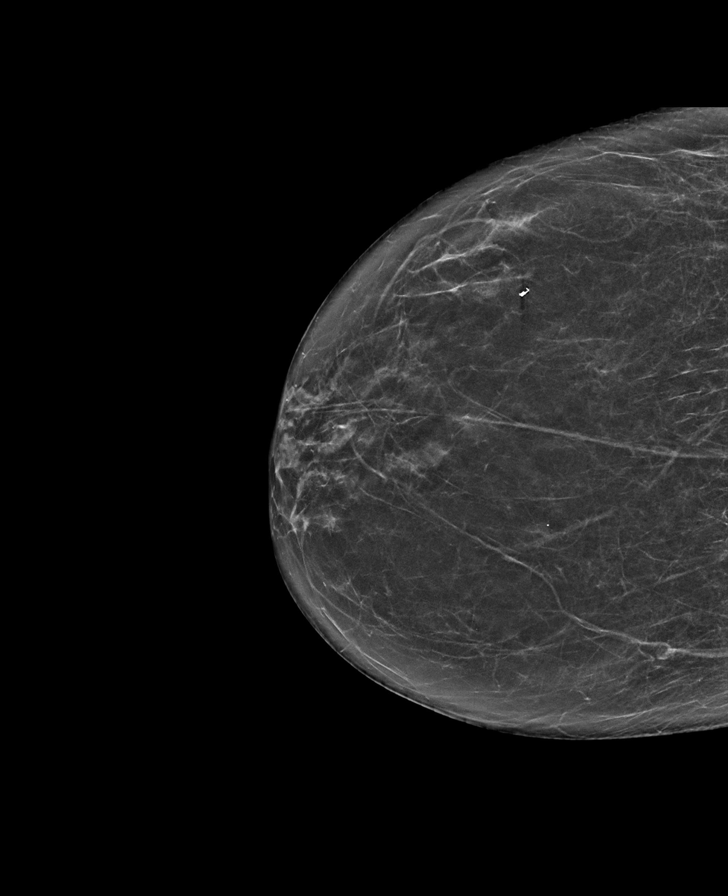

[L CC synth-2D]
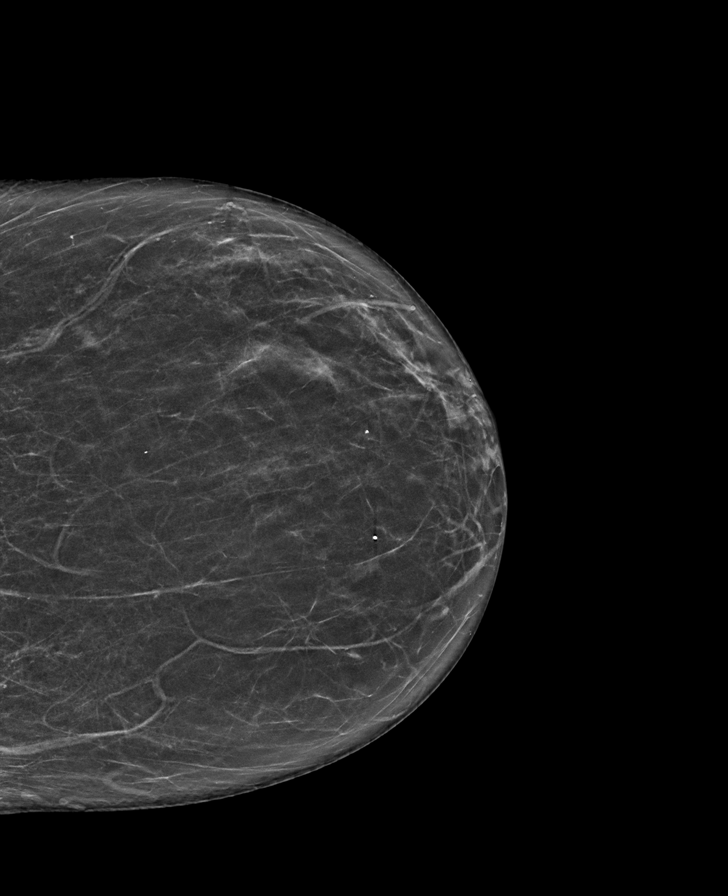

[L MLO synth-2D]
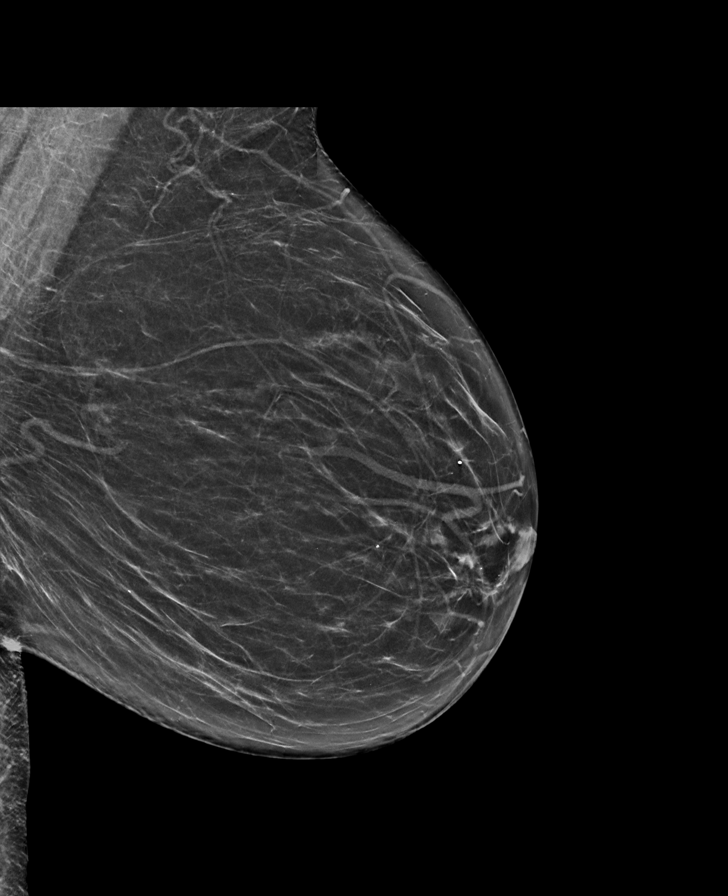

[R MLO synth-2D]
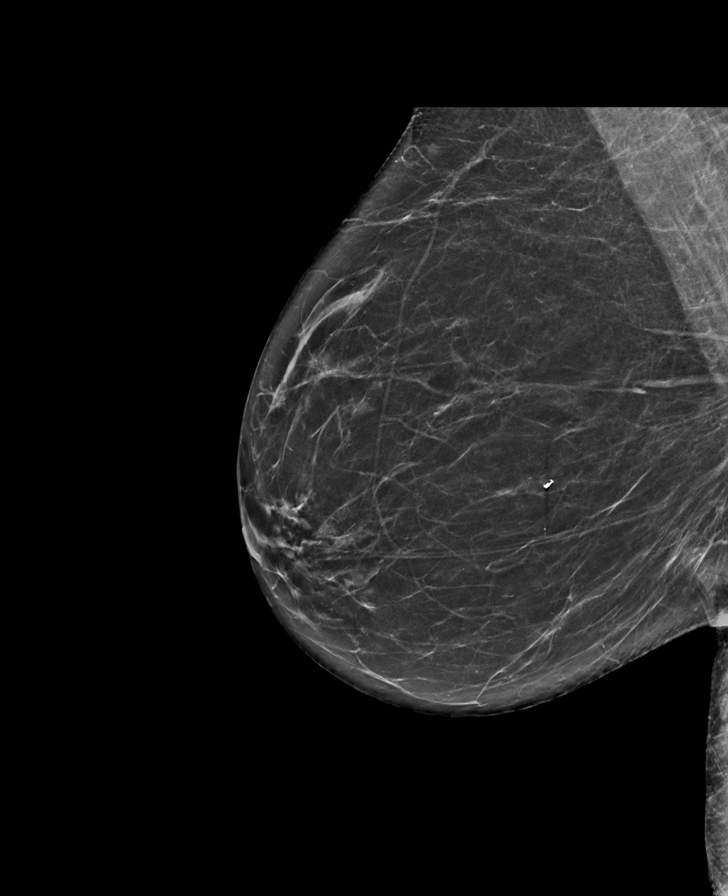

[R MLO tomo · tomo slice 35/68.0]
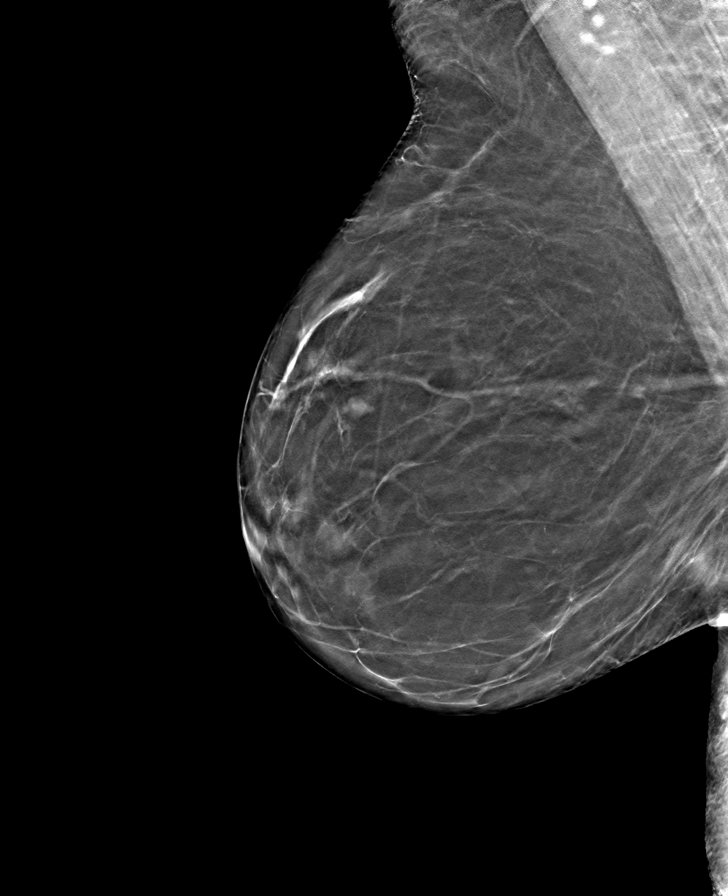

[L CC tomo · tomo slice 33/65.0]
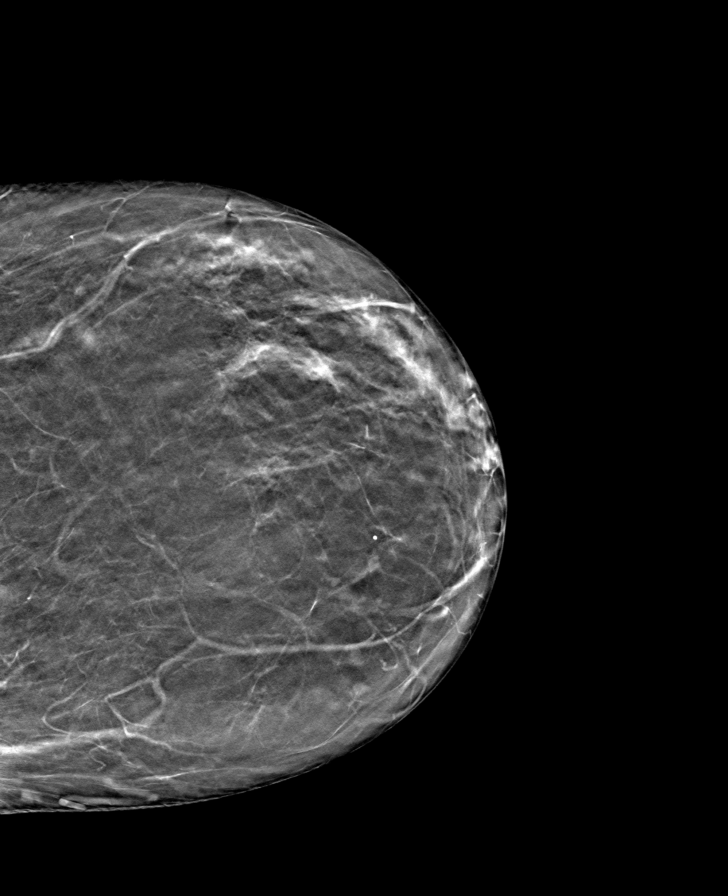

[R CC tomo · tomo slice 33/64.0]
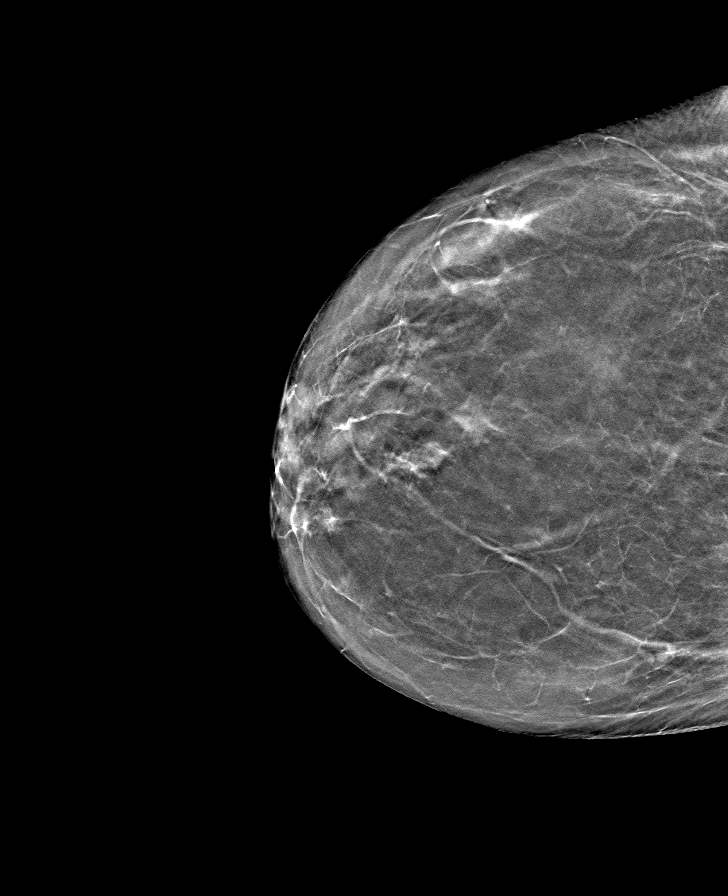

[L MLO tomo · tomo slice 35/70.0]
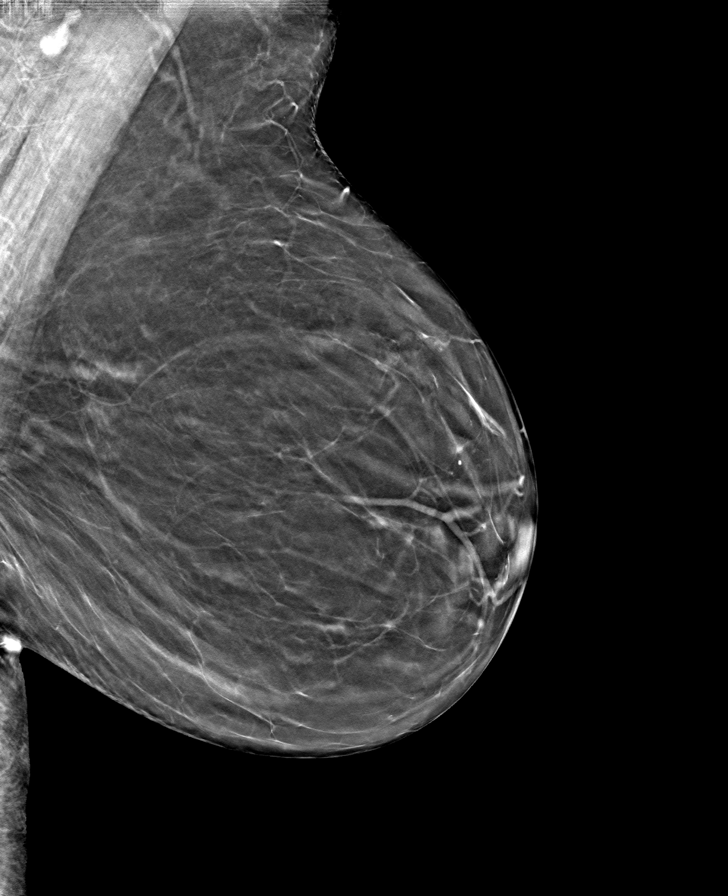

[8 of 24 positions shown; findings below may reference images not displayed]

ACR Breast Density Category b: There are scattered areas of
fibroglandular density.
FINDINGS: There are no findings suspicious for malignancy.
IMPRESSION: No mammographic evidence of malignancy. A result letter of this
screening mammogram will be mailed directly to the patient.

RECOMMENDATION:
Screening mammogram in one year. (Code:51-O-LD2)

BI-RADS CATEGORY  1: Negative.

## 2023-06-15 DIAGNOSIS — M25551 Pain in right hip: Secondary | ICD-10-CM | POA: Diagnosis not present

## 2023-06-23 NOTE — Progress Notes (Unsigned)
  Electrophysiology Office Note:   Date:  06/24/2023  ID:  Michelle, Johnston [birth year], MRN [id number]  Primary Cardiologist: Wendie Hamburg, MD Electrophysiologist: Boyce Byes, MD      History of Present Illness:   Michelle Johnston is a 73 y.o. female with h/o persistent AF s/p multiple ablation,. Secondary hypercoagulable state s/p Watchman device, HTN, PAD, moderate MR, Moderate AI, and tobacco abuse seen today for routine electrophysiology followup.   Since last being seen in our clinic the patient reports doing very well. No breakthrough arrhyhtmia that she has noticed. Otherwise, she denies chest pain, palpitations, dyspnea, PND, orthopnea, nausea, vomiting, dizziness, syncope, weight gain, or early satiety.  She has mild edema, but primarily in the evenings.   Review of systems complete and found to be negative unless listed in HPI.   EP Information / Studies Reviewed:    EKG is ordered today. Personal review as below.  EKG Interpretation Date/Time:  Thursday June 24 2023 10:45:42 EDT Ventricular Rate:  62 PR Interval:  266 QRS Duration:  104 QT Interval:  440 QTC Calculation: 446 R Axis:   -48  Text Interpretation: Sinus rhythm with 1st degree A-V block with Premature supraventricular complexes Incomplete right bundle branch block Left anterior fascicular block When compared with ECG of 26-Apr-2023 13:39, Premature supraventricular complexes are now Present Nonspecific T wave abnormality no longer evident in Anterior leads Confirmed by Pilar Bridge [phone]) on 06/24/2023 10:54:35 AM    Arrhythmia/Device History S/p PVI and posterior wall ablation 07/2020 S/p Watchman 07/2021 S/p PFA PVI and posterior wall ablation 03/2023   Physical Exam:   VS:  BP (!) 146/84 (BP Location: Left Arm, Patient Position: Sitting, Cuff Size: Normal)   Pulse 62   Ht 5\' 5"  (1.651 m)   Wt 147 lb (66.7 kg)   SpO2 98%   BMI 24.46 kg/m    Wt Readings from Last 3 Encounters:  06/24/23  147 lb (66.7 kg)  05/04/23 150 lb (68 kg)  04/26/23 148 lb 6.4 oz (67.3 kg)     GEN: No acute distress NECK: No JVD; No carotid bruits CARDIAC: Regular rate and rhythm, no murmurs, rubs, gallops RESPIRATORY:  Clear to auscultation without rales, wheezing or rhonchi  ABDOMEN: Soft, non-tender, non-distended EXTREMITIES:  No edema; No deformity   ASSESSMENT AND PLAN:    Persistent AF Paroxysmal AFL EKG today shows NSR with PACs STOP Xarelto  given Watchman in place. Resume ASA 81 mg daily.  Multiple flutter circuits noted during recent ablation. Per Dr. Marven Slimmer, medical therapy would be recommended with recurrence  Secondary hypercoagulable state S/p Watchman 07/2021 Xarelto  -> ASA as above.   HTN Stable on current regimen   VHD Mild MR Moderate to Severe AI  OSA  Encouraged nightly CPAP    Follow up with EP APP in 6 months  Signed, Tylene Galla, PA-C

## 2023-06-24 ENCOUNTER — Encounter: Payer: Self-pay | Admitting: Student

## 2023-06-24 ENCOUNTER — Ambulatory Visit: Payer: Medicare HMO | Attending: Student | Admitting: Student

## 2023-06-24 VITALS — BP 146/84 | HR 62 | Ht 65.0 in | Wt 147.0 lb

## 2023-06-24 DIAGNOSIS — I10 Essential (primary) hypertension: Secondary | ICD-10-CM | POA: Diagnosis not present

## 2023-06-24 DIAGNOSIS — I351 Nonrheumatic aortic (valve) insufficiency: Secondary | ICD-10-CM

## 2023-06-24 DIAGNOSIS — D6869 Other thrombophilia: Secondary | ICD-10-CM | POA: Diagnosis not present

## 2023-06-24 DIAGNOSIS — I4819 Other persistent atrial fibrillation: Secondary | ICD-10-CM | POA: Diagnosis not present

## 2023-06-24 DIAGNOSIS — Z95818 Presence of other cardiac implants and grafts: Secondary | ICD-10-CM

## 2023-06-24 MED ORDER — ASPIRIN 81 MG PO TBEC: 81.0000 mg | DELAYED_RELEASE_TABLET | Freq: Every day | ORAL | Status: AC

## 2023-06-24 NOTE — Patient Instructions (Signed)
 Medication Instructions:  1.Stop xarelto  2.Start aspirin  81 mg *If you need a refill on your cardiac medications before your next appointment, please call your pharmacy*  Lab Work: None ordered If you have labs (blood work) drawn today and your tests are completely normal, you will receive your results only by: MyChart Message (if you have MyChart) OR A paper copy in the mail If you have any lab test that is abnormal or we need to change your treatment, we will call you to review the results.  Follow-Up: At [facility], you and your health needs are our priority.  As part of our continuing mission to provide you with exceptional heart care, our providers are all part of one team.  This team includes your primary Cardiologist (physician) and Advanced Practice Providers or APPs (Physician Assistants and Nurse Practitioners) who all work together to provide you with the care you need, when you need it.  Your next appointment:   6 month(s)  Provider:   Harvie Liner, MD or Joycelyn Noa" [place], [place]      1st Floor: - Lobby - Registration  - Pharmacy  - Lab - Cafe  2nd Floor: - PV Lab - Diagnostic Testing (echo, CT, nuclear med)  3rd Floor: - Vacant  4th Floor: - TCTS (cardiothoracic surgery) - AFib Clinic - Structural Heart Clinic - Vascular Surgery  - Vascular Ultrasound  5th Floor: - HeartCare Cardiology (general and EP) - Clinical Pharmacy for coumadin, hypertension, lipid, weight-loss medications, and med management appointments    Valet parking services will be available as well.

## 2023-06-28 ENCOUNTER — Encounter: Payer: Self-pay | Admitting: Physician Assistant

## 2023-06-28 ENCOUNTER — Ambulatory Visit (INDEPENDENT_AMBULATORY_CARE_PROVIDER_SITE_OTHER): Admitting: Physician Assistant

## 2023-06-28 VITALS — Ht 63.78 in | Wt 146.4 lb

## 2023-06-28 DIAGNOSIS — M1711 Unilateral primary osteoarthritis, right knee: Secondary | ICD-10-CM | POA: Diagnosis not present

## 2023-06-28 NOTE — Progress Notes (Signed)
 Office Visit Note   Patient: Michelle Johnston           Date of Birth: 1951-01-31           MRN: [id number] Visit Date: 06/28/2023              Requested by: Glena Landau, MD [address] [place],  [place] [address] PCP: Glena Landau, MD   Assessment & Plan: Visit Diagnoses:  1. Primary osteoarthritis of right knee     Plan: Given failure of conservative measures which is included cortisone injections, gel injections, quad strengthening, and NSAIDs.  Recommend right total knee arthroplasty due to the fact that right knee pain is affecting patient's quality of life.  Risk benefits discussed.  Patient understands risk benefits having undergone left total knee arthroplasty in December 2020.  Risk include but are not limited to DVT, PE, blood loss, nerve vessel injury, wound healing problems, infection and prolonged pain.  Questions were encouraged and answered.  Patient will require cardiology clearance before scheduling.  When she is cleared from a [facility] cardiology standpoint we will schedule her for a right total knee arthroplasty. Prior radiographs of her right knee show near bone-on-bone medial compartment with osteophytes off the lateral margin.  Medial joint line overall well-preserved.  Patellofemoral joint arthritis moderate. Follow-Up Instructions: Return for post op.   Orders:  No orders of the defined types were placed in this encounter.  No orders of the defined types were placed in this encounter.     Procedures: No procedures performed   Clinical Data: No additional findings.   Subjective: Chief Complaint  Patient presents with   Right Knee - Pain    MONOVISC    HPI Michelle Johnston returns today wanting to discuss right total knee arthroplasty.  She has tried gel injections as recently as 04/15/2023 really gave her no relief.  She has also had cortisone injections.  Taking NSAIDs.  She had multiple aspirations of the right knee due to recurrent  effusion.  States that she is limping and now has pain daily in the knee.  She ranks her pain to be 6 out of 10 most of the time and 8 out of 10 at its worst.  Patient's medical history pertinent for paroxysmal atrial fibrillation, nonrheumatic aortic, insufficiency nonrheumatic, mitral valve regurgitation, peripheral artery disease, and sleep apnea.  She has had a Watchman device placed.  Review of Systems Denies any fevers, chills, ongoing infections, orthopnea, chest pain, shortness of breath.  Objective: Vital Signs: Ht 5' 3.78" (1.62 m)   Wt 146 lb 6.4 oz (66.4 kg)   BMI 25.30 kg/m   Physical Exam Constitutional:      Appearance: She is normal weight. She is not ill-appearing or diaphoretic.  Pulmonary:     Effort: Pulmonary effort is normal.  Neurological:     Mental Status: She is alert and oriented to person, place, and time.  Psychiatric:        Mood and Affect: Mood normal.     Ortho Exam Right knee: Full range of motion.  Positive effusion no abnormal warmth erythema.  Valgus deformity.  Tenderness lateral joint line.  No instability valgus varus stressing.  Calf supple nontender. Left knee: Good range of motion no instability valgus varus stressing.  No abnormal warmth erythema or effusion.  Well-healed surgical incision. Specialty Comments:  No specialty comments available.  Imaging: No results found.   PMFS History: Patient Active Problem List  Diagnosis Date Noted   Other specified disorders of eustachian tube, left ear 02/06/2023   Central perforation of tympanic membrane of left ear 02/06/2023   Impacted cerumen of right ear 02/06/2023   Presence of Watchman left atrial appendage closure device 08/07/2021   Atrial fibrillation (HCC) 08/07/2021   OSA (obstructive sleep apnea) 10/29/2020   Persistent atrial fibrillation (HCC) 03/27/2020   Hypercoagulable state due to persistent atrial fibrillation (HCC) 03/27/2020   PAD (peripheral artery disease) (HCC)  12/27/2019   Exertional chest pain 12/27/2019   Primary osteoarthritis of right knee 07/12/2019   Paroxysmal atrial fibrillation (HCC) 06/16/2019   Nonrheumatic aortic valve insufficiency 06/16/2019   Nonrheumatic mitral valve regurgitation 06/16/2019   Tachycardia 02/20/2019   Abnormal EKG 02/20/2019   Essential hypertension 02/20/2019   Status post total left knee replacement 02/03/2019   Unilateral primary osteoarthritis, left knee 11/30/2016   Chronic pain of left knee 11/30/2016   Past Medical History:  Diagnosis Date   Anxiety    Arthritis    Bradycardia    Bronchitis    Depression    Dyspnea    Glaucoma    Hypertension    Presence of Watchman left atrial appendage closure device 08/07/2021   Watchman FLX 27mm with Dr. Marven Slimmer   Seasonal allergies    Sleep apnea    Wears glasses     Family History  Problem Relation Age of Onset   Hypertension Mother    Hyperlipidemia Mother    Lymphoma Mother    Lung cancer Father     Past Surgical History:  Procedure Laterality Date   ABDOMINAL HYSTERECTOMY  1990   ATRIAL FIBRILLATION ABLATION N/A 08/12/2020   Procedure: ATRIAL FIBRILLATION ABLATION;  Surgeon: Boyce Byes, MD;  Location: [facility];  Service: Cardiovascular;  Laterality: N/A;   ATRIAL FIBRILLATION ABLATION N/A 03/26/2023   Procedure: ATRIAL FIBRILLATION ABLATION;  Surgeon: Boyce Byes, MD;  Location: [facility];  Service: Cardiovascular;  Laterality: N/A;   BREAST BIOPSY Right    benign   CARDIOVERSION N/A 08/15/2019   Procedure: CARDIOVERSION;  Surgeon: Cody Das, MD;  Location: [facility];  Service: Cardiovascular;  Laterality: N/A;   COLONOSCOPY     LEFT ATRIAL APPENDAGE OCCLUSION N/A 08/07/2021   Procedure: LEFT ATRIAL APPENDAGE OCCLUSION;  Surgeon: Boyce Byes, MD;  Location: [facility];  Service: Cardiovascular;  Laterality: N/A;   meniscus tear knee     left knee   NASAL SINUS SURGERY  1975   ORIF  ANKLE FRACTURE  2009   left   TEE WITHOUT CARDIOVERSION N/A 08/07/2021   Procedure: TRANSESOPHAGEAL ECHOCARDIOGRAM (TEE);  Surgeon: Boyce Byes, MD;  Location[facility];  Service: Cardiovascular;  Laterality: N/A;   TOTAL KNEE ARTHROPLASTY Left 02/03/2019   Procedure: LEFT TOTAL KNEE ARTHROPLASTY;  Surgeon: Arnie Lao, MD;  Location: [facility];  Service: Orthopedics;  Laterality: Left;   TURBINATE REDUCTION Bilateral 06/04/2014   Procedure: BILATERAL TURBINATE REDUCTION;  Surgeon: Reynold Caves, MD;  Location: [facility];  Service: ENT;  Laterality: Bilateral;   TYMPANOSTOMY TUBE PLACEMENT     left   Social History   Occupational History   Not on file  Tobacco Use   Smoking status: Former    Current packs/day: 0.00    Average packs/day: 2.0 packs/day for 30.0 years (60.0 ttl pk-yrs)    Types: Cigarettes    Start date: 05/27/1968    Quit date: 05/28/1998  Years since quitting: 25.1   Smokeless tobacco: Never  Vaping Use   Vaping status: Never Used  Substance and Sexual Activity   Alcohol use: Yes    Alcohol/week: 3.0 - 4.0 standard drinks of alcohol    Types: 2 Glasses of wine, 1 - 2 Cans of beer per week    Comment: daily-wine daily-1 glass   Drug use: No   Sexual activity: Not on file

## 2023-07-01 ENCOUNTER — Ambulatory Visit (HOSPITAL_BASED_OUTPATIENT_CLINIC_OR_DEPARTMENT_OTHER)

## 2023-07-01 ENCOUNTER — Emergency Department (HOSPITAL_COMMUNITY)
Admission: EM | Admit: 2023-07-01 | Discharge: 2023-07-01 | Disposition: A | Discharge status: Home / Self Care | Attending: Emergency Medicine | Admitting: Emergency Medicine

## 2023-07-01 ENCOUNTER — Other Ambulatory Visit: Payer: Self-pay

## 2023-07-01 ENCOUNTER — Emergency Department (HOSPITAL_COMMUNITY)

## 2023-07-01 ENCOUNTER — Other Ambulatory Visit (HOSPITAL_COMMUNITY): Payer: Self-pay | Admitting: Physician Assistant

## 2023-07-01 ENCOUNTER — Encounter (HOSPITAL_BASED_OUTPATIENT_CLINIC_OR_DEPARTMENT_OTHER): Payer: Self-pay

## 2023-07-01 DIAGNOSIS — M2540 Effusion, unspecified joint: Secondary | ICD-10-CM | POA: Diagnosis not present

## 2023-07-01 DIAGNOSIS — S129XXA Fracture of neck, unspecified, initial encounter: Secondary | ICD-10-CM | POA: Diagnosis not present

## 2023-07-01 DIAGNOSIS — M25521 Pain in right elbow: Secondary | ICD-10-CM | POA: Diagnosis not present

## 2023-07-01 DIAGNOSIS — I10 Essential (primary) hypertension: Secondary | ICD-10-CM | POA: Insufficient documentation

## 2023-07-01 DIAGNOSIS — W19XXXA Unspecified fall, initial encounter: Secondary | ICD-10-CM

## 2023-07-01 DIAGNOSIS — S52121A Displaced fracture of head of right radius, initial encounter for closed fracture: Secondary | ICD-10-CM | POA: Diagnosis not present

## 2023-07-01 DIAGNOSIS — S42491D Other displaced fracture of lower end of right humerus, subsequent encounter for fracture with routine healing: Secondary | ICD-10-CM

## 2023-07-01 DIAGNOSIS — W010XXA Fall on same level from slipping, tripping and stumbling without subsequent striking against object, initial encounter: Secondary | ICD-10-CM | POA: Diagnosis not present

## 2023-07-01 DIAGNOSIS — S199XXA Unspecified injury of neck, initial encounter: Secondary | ICD-10-CM | POA: Diagnosis not present

## 2023-07-01 DIAGNOSIS — J439 Emphysema, unspecified: Secondary | ICD-10-CM | POA: Diagnosis not present

## 2023-07-01 DIAGNOSIS — Z7982 Long term (current) use of aspirin: Secondary | ICD-10-CM | POA: Insufficient documentation

## 2023-07-01 DIAGNOSIS — Z79899 Other long term (current) drug therapy: Secondary | ICD-10-CM | POA: Diagnosis not present

## 2023-07-01 DIAGNOSIS — I6523 Occlusion and stenosis of bilateral carotid arteries: Secondary | ICD-10-CM | POA: Diagnosis not present

## 2023-07-01 DIAGNOSIS — S0990XA Unspecified injury of head, initial encounter: Secondary | ICD-10-CM | POA: Insufficient documentation

## 2023-07-01 DIAGNOSIS — S42414A Nondisplaced simple supracondylar fracture without intercondylar fracture of right humerus, initial encounter for closed fracture: Secondary | ICD-10-CM | POA: Diagnosis not present

## 2023-07-01 DIAGNOSIS — S42411A Displaced simple supracondylar fracture without intercondylar fracture of right humerus, initial encounter for closed fracture: Secondary | ICD-10-CM | POA: Diagnosis not present

## 2023-07-01 MED ORDER — FENTANYL CITRATE PF 50 MCG/ML IJ SOSY
50.0000 ug | PREFILLED_SYRINGE | Freq: Once | INTRAMUSCULAR | Status: AC
  Administered 2023-07-01: 50 ug via INTRAVENOUS
  Filled 2023-07-01: qty 1

## 2023-07-01 MED ORDER — ACETAMINOPHEN 500 MG PO TABS
500.0000 mg | ORAL_TABLET | Freq: Once | ORAL | Status: AC
  Administered 2023-07-01: 500 mg via ORAL
  Filled 2023-07-01: qty 1

## 2023-07-01 MED ORDER — OXYCODONE HCL 5 MG PO TABS
5.0000 mg | ORAL_TABLET | Freq: Once | ORAL | Status: AC
  Administered 2023-07-01: 5 mg via ORAL
  Filled 2023-07-01: qty 1

## 2023-07-01 NOTE — ED Notes (Signed)
 Patient verbalizes understanding of discharge instructions. Opportunity for questioning and answers were provided. Armband removed by staff, pt discharged from ED. Wheeled out and assisted into car with husband

## 2023-07-01 NOTE — ED Provider Notes (Signed)
 [place] EMERGENCY DEPARTMENT AT [facility] Provider Note   CSN: [id number] Arrival date & time: 07/01/23  1413     History  Chief Complaint  Patient presents with   Fall    Pt fall with headstrike last night, came off eloquis last week was on for three months due to ablation. Pt did not lose consciousness. Pt also arrives with paperwork stating her R elbow is fractured, and requests head and elbow CT.     Michelle Johnston is a 73 y.o. female with history of A-fib with watchman in place not on any anticoagulation, hypertension, presents with concern for a fall that occurred yesterday.  States she tripped over a extension cord, causing her to land on the front of her face.  Denies any loss of consciousness.  Denies any changes in vision, nausea or vomiting.  Also reports she had pain in her right elbow so went to be evaluated by orthopedics Dr. Gilberto Labella and Lucie Ruts PA-C yesterday who found a fracture in her right elbow and recommended she get a CT for further evaluation.  Denies any paresthesias of the upper and lower extremities bilaterally.  Has been able to walk without difficulty.   Fall       Home Medications Prior to Admission medications   Medication Sig Start Date End Date Taking? Authorizing Provider  acetaminophen  (TYLENOL ) 500 MG tablet Take 1,000 mg by mouth every 6 (six) hours as needed for moderate pain (pain score 4-6).    [provider]  albuterol  (VENTOLIN  HFA) 108 (90 Base) MCG/ACT inhaler Inhale 1-2 puffs into the lungs every 6 (six) hours as needed for wheezing or shortness of breath.    [provider]  amLODipine  (NORVASC ) 10 MG tablet Take 10 mg by mouth at bedtime.    [provider]  aspirin  EC 81 MG tablet Take 1 tablet (81 mg total) by mouth daily. Swallow whole. 06/24/23   Tylene Galla, PA-C  b complex vitamins tablet Take 1 tablet by mouth daily.    [provider]  calcium  carbonate  (OS-CAL - DOSED IN MG OF ELEMENTAL CALCIUM ) 1250 (500 Ca) MG tablet Take 1 tablet by mouth daily with breakfast.    [provider]  carvedilol  (COREG ) 6.25 MG tablet TAKE 1 TABLET BY MOUTH TWICE DAILY WITH A MEAL 02/18/23   Boyce Byes, MD  cetirizine (ZYRTEC) 10 MG tablet Take 10 mg by mouth at bedtime.    [provider]  Cholecalciferol  (VITAMIN D) 50 MCG (2000 UT) tablet Take 2,000 Units by mouth daily.    [provider]  cyanocobalamin  (VITAMIN B12) 1000 MCG/ML injection Inject 1,000 mcg into the muscle every Sunday. 05/29/22   [provider]  FLUoxetine  (PROZAC ) 40 MG capsule Take 40 mg by mouth daily. 02/21/20   [provider]  gabapentin  (NEURONTIN ) 300 MG capsule Take 300-600 mg by mouth See admin instructions. Take 300 mg in the morning and 600 mg at night 04/19/20   [provider]  guaiFENesin (MUCINEX) 600 MG 12 hr tablet Take 600 mg by mouth daily as needed for cough or to loosen phlegm.    [provider]  ipratropium (ATROVENT) 0.06 % nasal spray Place 1 spray into both nostrils daily as needed for rhinitis. 09/12/19   [provider]  latanoprost  (XALATAN ) 0.005 % ophthalmic solution Place 1 drop into both eyes every morning.     [provider]  meloxicam (MOBIC) 15 MG tablet  Take 7.5 mg by mouth daily as needed for pain.    [provider]  methylphenidate (RITALIN) 10 MG tablet Take 5 mg by mouth daily. 12/15/21   [provider]  montelukast  (SINGULAIR ) 10 MG tablet Take 10 mg by mouth daily as needed (allergies).    [provider]  Multiple Vitamins-Minerals (MULTIVITAMIN WITH MINERALS) tablet Take 1 tablet by mouth daily.    [provider]  nitroGLYCERIN  (NITROSTAT ) 0.4 MG SL tablet Place 0.4 mg under the tongue every 5 (five) minutes as needed for chest pain.    [provider]  Omega-3 Fatty Acids (FISH OIL) 1000 MG CAPS Take 1,000 mg by mouth  daily.    [provider]  omeprazole (PRILOSEC) 20 MG capsule Take 20 mg by mouth daily as needed (acid reflux). 12/14/19   [provider]  rosuvastatin  (CRESTOR ) 20 MG tablet Take 1 tablet by mouth once daily 04/14/23   Wendie Hamburg, MD  vitamin C  (ASCORBIC ACID ) 500 MG tablet Take 500 mg by mouth daily.    [provider]      Allergies    Dexlansoprazole, Flecainide , Nasal spray, Other, Zetia  [ezetimibe ], Cefaclor, Ciprofibrate, Ciprofloxacin , and Metronidazole     Review of Systems   Review of Systems  Musculoskeletal:        Right elbow pain  Neurological:  Negative for dizziness.    Physical Exam Updated Vital Signs BP (!) 162/63 (BP Location: Left Arm)   Pulse (!) 58   Temp 98 F (36.7 C) (Oral)   Resp 16   SpO2 97%  Physical Exam Vitals and nursing note reviewed.  Constitutional:      General: She is not in acute distress.    Appearance: She is well-developed.  HENT:     Head: Normocephalic.     Comments: Ecchymoses to the right side of patient's face diffusely.  No raccoon eyes or Battle sign  No tenderness to palpation of the skull diffusely, no step-offs noted.  No hematomas or lacerations Eyes:     Extraocular Movements: Extraocular movements intact.     Conjunctiva/sclera: Conjunctivae normal.     Pupils: Pupils are equal, round, and reactive to light.  Cardiovascular:     Rate and Rhythm: Normal rate and regular rhythm.     Heart sounds: No murmur heard.    Comments: 2+ radial pulse bilaterally Pulmonary:     Effort: Pulmonary effort is normal. No respiratory distress.     Breath sounds: Normal breath sounds.  Abdominal:     Palpations: Abdomen is soft.     Tenderness: There is no abdominal tenderness.  Musculoskeletal:        General: No swelling.     Cervical back: Neck supple.     Comments: General Right arm in sling with splint in place  Palpation Non-tender to palpation of the clavicles,humerus, radius  and ulna, carpal bones, 1st-5th metacarpals and phalanges of LUE  Non-tender to palpation of the clavicles,humerus, carpal bones, 1st-5th metacarpals and phalanges of RUE. Reports pain in right elbow/upper radius and ulna  Non tender over the femur, patella, tibia or fibula bilaterally  Non-tender over the cervical, thoracic, or lumbar spinous processes. Non-tender to palpation of the paraspinal region of the back or chest wall diffusely  No tenderness of the pelvis diffusely  ROM Full ROM of shoulders bilaterally Full elbow, wrist flexion and extension in LUE Full wrist flexion and extension in RUE. Limited elbow flexion and extension due to pain in  RUE Intact plantarflexion and dorsiflexion, hip flexion bilaterally  Sensation: Sensation intact throughout the bilateral upper and lower extremity  Strength: 5/5 strength with resisted wrist flexion and extension bilaterally 5/5 strength with resisted knee flexion and extension and ankle plantarflexion and dorsiflexion bilaterally    Skin:    General: Skin is warm and dry.     Capillary Refill: Capillary refill takes less than 2 seconds.     Comments: No lacerations, abrasions, or open fracture of the RUE  Neurological:     General: No focal deficit present.     Mental Status: She is alert.  Psychiatric:        Mood and Affect: Mood normal.     ED Results / Procedures / Treatments   Labs (all labs ordered are listed, but only abnormal results are displayed) Labs Reviewed - No data to display  EKG None  Radiology CT ELBOW RIGHT WO CONTRAST Result Date: 07/01/2023 CLINICAL DATA:  Trauma. EXAM: CT OF THE UPPER RIGHT EXTREMITY WITHOUT CONTRAST TECHNIQUE: Multidetector CT imaging of the upper right extremity was performed according to the standard protocol. RADIATION DOSE REDUCTION: This exam was performed according to the departmental dose-optimization program which includes automated exposure control, adjustment of the mA and/or  kV according to patient size and/or use of iterative reconstruction technique. COMPARISON:  X-ray elbow 07/01/2023 imaging without report, CT abdomen pelvis 04/21/2023 FINDINGS: Bones/Joint/Cartilage Acute nondisplaced lateral supracondylar distal humeral fracture. Acute minimally displaced radial head and neck fracture. Joint effusion. No severe degenerative changes. Ligaments Suboptimally assessed by CT. Muscles and Tendons Grossly unremarkable. Soft tissues Diffuse subcutaneus soft tissue edema. IMPRESSION: 1. Acute nondisplaced lateral supracondylar distal humeral fracture. 2. Acute minimally displaced radial head and neck fracture. 3. Joint effusion. Electronically Signed   By: Morgane  Naveau M.D.   On: 07/01/2023 17:32   CT Head Wo Contrast Result Date: 07/01/2023 CLINICAL DATA:  Head trauma, minor (Age >= 65y) Head trauma, moderate-severe; Neck trauma (Age >= 65y) EXAM: CT HEAD WITHOUT CONTRAST CT CERVICAL SPINE WITHOUT CONTRAST TECHNIQUE: Multidetector CT imaging of the head and cervical spine was performed following the standard protocol without intravenous contrast. Multiplanar CT image reconstructions of the cervical spine were also generated. RADIATION DOSE REDUCTION: This exam was performed according to the departmental dose-optimization program which includes automated exposure control, adjustment of the mA and/or kV according to patient size and/or use of iterative reconstruction technique. COMPARISON:  CT angio head and neck 02/07/2020 FINDINGS: CT HEAD FINDINGS Brain: No evidence of large-territorial acute infarction. No parenchymal hemorrhage. No mass lesion. No extra-axial collection. No mass effect or midline shift. No hydrocephalus. Basilar cisterns are patent. Vascular: No hyperdense vessel. Atherosclerotic calcifications are present within the cavernous internal carotid arteries. Skull: No acute fracture or focal lesion. Sinuses/Orbits: Paranasal sinuses and mastoid air cells are clear.  Bilateral lens replacement. Otherwise the orbits are unremarkable. Other: None. CT CERVICAL SPINE FINDINGS Alignment: Stable grade 1 anterolisthesis C2 on C3. Stable Mild retrolisthesis of C4 on C5. Stable Grade 1 anterolisthesis of C5 on C6. Stable grade 2 anterolisthesis of C7 on T1. Skull base and vertebrae: Multilevel severe degenerative changes of the spine. No acute fracture. No aggressive appearing focal osseous lesion or focal pathologic process. Soft tissues and spinal canal: No prevertebral fluid or swelling. No visible canal hematoma. Upper chest: Biapical pleural/pulmonary scarring. At least mild centrilobular emphysematous changes. Other: None. IMPRESSION: 1. No acute intracranial abnormality. 2. No acute displaced fracture or traumatic listhesis of the cervical spine. 3.  Emphysema (ICD10-J43.9). Electronically Signed   By: Morgane  Naveau M.D.   On: 07/01/2023 17:25   CT Cervical Spine Wo Contrast Result Date: 07/01/2023 CLINICAL DATA:  Head trauma, minor (Age >= 65y) Head trauma, moderate-severe; Neck trauma (Age >= 65y) EXAM: CT HEAD WITHOUT CONTRAST CT CERVICAL SPINE WITHOUT CONTRAST TECHNIQUE: Multidetector CT imaging of the head and cervical spine was performed following the standard protocol without intravenous contrast. Multiplanar CT image reconstructions of the cervical spine were also generated. RADIATION DOSE REDUCTION: This exam was performed according to the departmental dose-optimization program which includes automated exposure control, adjustment of the mA and/or kV according to patient size and/or use of iterative reconstruction technique. COMPARISON:  CT angio head and neck 02/07/2020 FINDINGS: CT HEAD FINDINGS Brain: No evidence of large-territorial acute infarction. No parenchymal hemorrhage. No mass lesion. No extra-axial collection. No mass effect or midline shift. No hydrocephalus. Basilar cisterns are patent. Vascular: No hyperdense vessel. Atherosclerotic calcifications are  present within the cavernous internal carotid arteries. Skull: No acute fracture or focal lesion. Sinuses/Orbits: Paranasal sinuses and mastoid air cells are clear. Bilateral lens replacement. Otherwise the orbits are unremarkable. Other: None. CT CERVICAL SPINE FINDINGS Alignment: Stable grade 1 anterolisthesis C2 on C3. Stable Mild retrolisthesis of C4 on C5. Stable Grade 1 anterolisthesis of C5 on C6. Stable grade 2 anterolisthesis of C7 on T1. Skull base and vertebrae: Multilevel severe degenerative changes of the spine. No acute fracture. No aggressive appearing focal osseous lesion or focal pathologic process. Soft tissues and spinal canal: No prevertebral fluid or swelling. No visible canal hematoma. Upper chest: Biapical pleural/pulmonary scarring. At least mild centrilobular emphysematous changes. Other: None. IMPRESSION: 1. No acute intracranial abnormality. 2. No acute displaced fracture or traumatic listhesis of the cervical spine. 3.  Emphysema (ICD10-J43.9). Electronically Signed   By: Morgane  Naveau M.D.   On: 07/01/2023 17:25    Procedures Procedures    Medications Ordered in ED Medications  acetaminophen  (TYLENOL ) tablet 500 mg (500 mg Oral Given 07/01/23 1515)  fentaNYL  (SUBLIMAZE ) injection 50 mcg (50 mcg Intravenous Given 07/01/23 1925)  oxyCODONE  (Oxy IR/ROXICODONE ) immediate release tablet 5 mg (5 mg Oral Given 07/01/23 1925)    ED Course/ Medical Decision Making/ A&P                                 Medical Decision Making Amount and/or Complexity of Data Reviewed Radiology: ordered.  Risk OTC drugs. Prescription drug management.     Differential diagnosis includes but is not limited to intracranial hemorrhage, fracture, dislocation, sprain  ED Course:  Upon initial evaluation, patient is well-appearing, stable vitals aside from elevated blood pressure 143/66.  She does have ecchymoses diffusely to the right side of her face from her fall yesterday.  Reporting pain  in this area.  No overlying lacerations or abrasions.  No neurodeficits.  Also reporting pain to her right elbow, states she had a x-ray performed today by orthopedics outpatient which showed a fracture.  She is neurovascularly intact in the bilateral upper extremities.  Fracture is closed, no open wounds.  Orthopedics was requesting CT scan, will order this here in addition to CT head and cervical spine.  Imaging Studies ordered: I ordered imaging studies including CT head, CT cervical spine, CT right elbow I independently visualized the imaging with scope of interpretation limited to determining acute life threatening conditions related to emergency care.  CT head and cervical spine without  acute abnormality CT right elbow with nondisplaced lateral supracondylar distal humeral fracture.  Also has acute minimally displaced radial head and neck fracture I agree with the radiologist interpretation   Consultations Obtained: I requested consultation with the orthopedics Dr. Jackee Marus,  and discussed lab and imaging findings as well as pertinent plan - they recommend: Placing patient in long-arm splint and having her follow-up with their group within the next.  Medications Given: Tylenol , fentanyl , oxycodone  given for pain  Upon re-evaluation, patient reports pain better controlled with medications.  She was placed into a long-arm splint in the right upper extremity, remains neurovascularly intact after splint placement.  She was provided a sling to wear for comfort.  She states she has the contact information for the orthopedic group she saw earlier today, and will contact them for a follow up appointment within the next week.  She states the orthopedic provider she saw earlier today prescribed her hydrocodone for pain, will not provide any further opioid prescriptions.  Stable and appropriate for discharge home    Impression: Mechanical fall Right nondisplaced lateral supracondylar distal humerus  fracture Minimally displaced right radial head and neck fracture  Disposition:  The patient was discharged home with instructions to follow-up with her orthopedic provider within the next week.  I also gave her the contact information for Lucie Ruts who seems to be the provider she saw earlier today.  She understands she needs to call and schedule an appointment within the next week.  Tylenol  and ibuprofen as needed for pain.  May take the hydrocodone prescribed by provider earlier today as needed for breakthrough pain. Return precautions given.    This chart was dictated using voice recognition software, Dragon. Despite the best efforts of this provider to proofread and correct errors, errors may still occur which can change documentation meaning.          Final Clinical Impression(s) / ED Diagnoses Final diagnoses:  Fall, initial encounter  Right supracondylar humerus fracture, closed, initial encounter  Closed displaced fracture of head of right radius, initial encounter    Rx / DC Orders ED Discharge Orders     None         Rexie Catena, PA-C 07/01/23 1945    Dorenda Gandy, MD 07/03/23 [phone]

## 2023-07-01 NOTE — Progress Notes (Signed)
 Orthopedic Tech Progress Note Patient Details:  Michelle Johnston [birth year] [id number]  Ortho Devices Type of Ortho Device: Ace wrap, Shoulder immobilizer Ortho Device/Splint Location: re wrapped splint from ortho office. applied new sling. Ortho Device/Splint Interventions: Ordered, Application, Adjustment   Post Interventions Patient Tolerated: Well Instructions Provided: Adjustment of device, Care of device  Leodis Rainwater 07/01/2023, 7:18 PM

## 2023-07-01 NOTE — Discharge Instructions (Addendum)
 The CT of your head and neck did not show any abnormalities.  Your elbow CT today shows that you have a fracture of your humerus and radius.   You have been placed into a splint to hold the fracture in place.  Please keep this splint clean and dry.  Wrap the splint with a garbage bag or Saran wrap to keep it dry while showering or bathing.  Do not take off the splint.   You may take up to 1000mg  of tylenol  every 6 hours as needed for pain. Do not take more then 4g per day.   You may use up to 600mg  ibuprofen every 6 hours as needed for pain.  Do not exceed 2.4g of ibuprofen per day.  You may alternate these medications for complete coverage. See instructions below: Take 600mg  ibuprofen, then 3 hours later take 1000mg  tylenol , then 3 hours later 600mg  ibuprofen, then 3 hours later 1000mg  tylenol , so on and so forth for pain control.   Call the orthopedics office listed below as soon as possible to schedule a follow-up appointment within the next week  Return to the ER for any uncontrolled pain, numbness or tingling, discoloration, any other new or concerning symptoms.  "EXAM: CT OF THE UPPER RIGHT EXTREMITY WITHOUT CONTRAST   TECHNIQUE: Multidetector CT imaging of the upper right extremity was performed according to the standard protocol.   RADIATION DOSE REDUCTION: This exam was performed according to the departmental dose-optimization program which includes automated exposure control, adjustment of the mA and/or kV according to patient size and/or use of iterative reconstruction technique.   COMPARISON:  X-ray elbow 07/01/2023 imaging without report, CT abdomen pelvis 04/21/2023   FINDINGS: Bones/Joint/Cartilage   Acute nondisplaced lateral supracondylar distal humeral fracture. Acute minimally displaced radial head and neck fracture. Joint effusion. No severe degenerative changes.   Ligaments   Suboptimally assessed by CT.   Muscles and Tendons   Grossly unremarkable.    Soft tissues   Diffuse subcutaneus soft tissue edema.   IMPRESSION: 1. Acute nondisplaced lateral supracondylar distal humeral fracture. 2. Acute minimally displaced radial head and neck fracture. 3. Joint effusion.     Electronically Signed   By: Morgane  Naveau M.D.   On: 07/01/2023 17:32"

## 2023-07-07 ENCOUNTER — Ambulatory Visit (HOSPITAL_COMMUNITY): Payer: Medicare HMO

## 2023-07-09 ENCOUNTER — Telehealth: Payer: Self-pay

## 2023-07-09 NOTE — Telephone Encounter (Signed)
 Patient called and stated she fx'd her arm x2.  She will have to postpone scheduling TKA until that is healed.  She will call me back when ready.

## 2023-07-13 ENCOUNTER — Encounter (HOSPITAL_BASED_OUTPATIENT_CLINIC_OR_DEPARTMENT_OTHER): Payer: Self-pay | Admitting: Orthopaedic Surgery

## 2023-07-13 ENCOUNTER — Other Ambulatory Visit: Payer: Self-pay

## 2023-07-13 DIAGNOSIS — M25521 Pain in right elbow: Secondary | ICD-10-CM | POA: Diagnosis not present

## 2023-07-13 NOTE — Progress Notes (Signed)
 Patient's cardiac records reviewed with Dr Denese Finn, [place] for [facility].

## 2023-07-14 NOTE — Discharge Instructions (Signed)
 Grafton Lawrence MD, MPH Nicholas Bari, PA-C [facility] [address] [phone] (tel)   [phone] (fax)   POST-OPERATIVE INSTRUCTIONS  WOUND CARE You may remove the Operative Dressing on Post-Op Day #3 (72hrs after surgery).   Alternatively if you would like you can leave dressing on until follow-up if within 7-8 days but keep it dry. Leave steri-strips in place until they fall off on their own, usually 2 weeks postop. There may be a small amount of fluid/bleeding leaking at the surgical site.  This is normal You may change/reinforce the bandage as needed.  Use the Cryocuff or Ice as often as possible for the first 7 days, then as needed for pain relief. Always keep a towel, ACE wrap or other barrier between the cooling unit and your skin.  You may shower on Post-Op Day #3. Gently pat the area dry.  Do not soak the shoulder in water  or submerge it.  Keep incisions as dry as possible. Do not go swimming in the pool or ocean until 4 weeks after surgery or when otherwise instructed.    EXERCISES Wear the sling for comfort You may remove the sling for showering, but keep the arm across the chest or in a secondary sling.     It is normal for your fingers/hand to become more swollen after surgery and discolored from bruising.   This will resolve over the first few weeks usually after surgery. Please continue to ambulate and do not stay sitting or lying for too long.  Perform foot and wrist pumps to assist in circulation.  PHYSICAL THERAPY - You will begin physical therapy soon after surgery (unless otherwise specified) - Please call to set up an appointment, if you do not already have one  - Let our office if there are any issues with scheduling your therapy  - Our office will call you to schedule post-op PT  REGIONAL ANESTHESIA (NERVE BLOCKS) The anesthesia team may have performed a nerve block for you this is a great tool used to minimize pain.    The block may start wearing off overnight (between 8-24 hours postop) When the block wears off, your pain may go from nearly zero to the pain you would have had postop without the block. This is an abrupt transition but nothing dangerous is happening.   This can be a challenging period but utilize your as needed pain medications to try and manage this period. We suggest you use the pain medication the first night prior to going to bed, to ease this transition.  You may take an extra dose of narcotic when this happens if needed  POST-OP MEDICATIONS- Multimodal approach to pain control In general your pain will be controlled with a combination of substances.  Prescriptions unless otherwise discussed are electronically sent to your pharmacy.  This is a carefully made plan we use to minimize narcotic use.     Meloxicam - Anti-inflammatory medication taken on a scheduled basis Acetaminophen  - Non-narcotic pain medicine taken on a scheduled basis  Oxycodone  - This is a strong narcotic, to be used only on an "as needed" basis for SEVERE pain. Zofran  - take as needed for nausea   FOLLOW-UP If you develop a Fever (>=101.5), Redness or Drainage from the surgical incision site, please call our office to arrange for an evaluation. Please call the office to schedule a follow-up appointment for your first post-operative appointment, 7-10 days post-operatively.    HELPFUL INFORMATION   You  may be more comfortable sleeping in a semi-seated position the first few nights following surgery.  Keep a pillow propped under the elbow and forearm for comfort.  If you have a recliner type of chair it might be beneficial.  If not that is fine too, but it would be helpful to sleep propped up with pillows behind your operated shoulder as well under your elbow and forearm.  This will reduce pulling on the suture lines.  When dressing, put your operative arm in the sleeve first.  When getting undressed, take your  operative arm out last.  Loose fitting, button-down shirts are recommended.  Often in the first days after surgery you may be more comfortable keeping your operative arm under your shirt and not through the sleeve.  You may return to work/school in the next couple of days when you feel up to it.  Desk work and typing in the sling is fine.  We suggest you use the pain medication the first night prior to going to bed, in order to ease any pain when the anesthesia wears off. You should avoid taking pain medications on an empty stomach as it will make you nauseous.  You should wean off your narcotic medicines as soon as you are able.  Most patients will be off narcotics before their first postop appointment.   Do not drink alcoholic beverages or take illicit drugs when taking pain medications.  It is against the law to drive while taking narcotics.  In some states it is against the law to drive while your arm is in a sling.   Pain medication may make you constipated.  Below are a few solutions to try in this order: Decrease the amount of pain medication if you aren't having pain. Drink lots of decaffeinated fluids. Drink prune juice and/or eat dried prunes  If the first 3 don't work start with additional solutions Take Colace - an over-the-counter stool softener Take Senokot - an over-the-counter laxative Take Miralax  - a stronger over-the-counter laxative  For more information including helpful videos and documents visit our website:   [email or web address]   Post Anesthesia Home Care Instructions  Activity: Get plenty of rest for the remainder of the day. A responsible individual must stay with you for 24 hours following the procedure.  For the next 24 hours, DO NOT: -Drive a car -Advertising copywriter -Drink alcoholic beverages -Take any medication unless instructed by your physician -Make any legal decisions or sign important papers.  Meals: Start  with liquid foods such as gelatin or soup. Progress to regular foods as tolerated. Avoid greasy, spicy, heavy foods. If nausea and/or vomiting occur, drink only clear liquids until the nausea and/or vomiting subsides. Call your physician if vomiting continues.  Special Instructions/Symptoms: Your throat may feel dry or sore from the anesthesia or the breathing tube placed in your throat during surgery. If this causes discomfort, gargle with warm salt water . The discomfort should disappear within 24 hours.  If you had a scopolamine patch placed behind your ear for the management of post- operative nausea and/or vomiting:  1. The medication in the patch is effective for 72 hours, after which it should be removed.  Wrap patch in a tissue and discard in the trash. Wash hands thoroughly with soap and water . 2. You may remove the patch earlier than 72 hours if you experience unpleasant side effects which may include dry mouth, dizziness or visual disturbances. 3. Avoid touching the patch. Wash your hands with soap and  water  after contact with the patch.     Tylenol  can be taken after 4 pm if needed   Information for Discharge Teaching: EXPAREL  (bupivacaine  liposome injectable suspension)   Pain relief is important to your recovery. The goal is to control your pain so you can move easier and return to your normal activities as soon as possible after your procedure. Your physician may use several types of medicines to manage pain, swelling, and more.  Your surgeon or anesthesiologist gave you EXPAREL (bupivacaine ) to help control your pain after surgery.  EXPAREL  is a local anesthetic designed to release slowly over an extended period of time to provide pain relief by numbing the tissue around the surgical site. EXPAREL  is designed to release pain medication over time and can control pain for up to 72 hours. Depending on how you respond to EXPAREL , you may require less pain medication during your  recovery. EXPAREL  can help reduce or eliminate the need for opioids during the first few days after surgery when pain relief is needed the most. EXPAREL  is not an opioid and is not addictive. It does not cause sleepiness or sedation.   Important! A teal colored band has been placed on your arm with the date, time and amount of EXPAREL  you have received. Please leave this armband in place for the full 96 hours following administration, and then you may remove the band. If you return to the hospital for any reason within 96 hours following the administration of EXPAREL , the armband provides important information that your health care providers to know, and alerts them that you have received this anesthetic.    Possible side effects of EXPAREL : Temporary loss of sensation or ability to move in the area where medication was injected. Nausea, vomiting, constipation Rarely, numbness and tingling in your mouth or lips, lightheadedness, or anxiety may occur. Call your doctor right away if you think you may be experiencing any of these sensations, or if you have other questions regarding possible side effects.  Follow all other discharge instructions given to you by your surgeon or nurse. Eat a healthy diet and drink plenty of water  or other fluids.   Regional Anesthesia Blocks  1. You may not be able to move or feel the "blocked" extremity after a regional anesthetic block. This may last may last from 3-48 hours after placement, but it will go away. The length of time depends on the medication injected and your individual response to the medication. As the nerves start to wake up, you may experience tingling as the movement and feeling returns to your extremity. If the numbness and inability to move your extremity has not gone away after 48 hours, please call your surgeon.   2. The extremity that is blocked will need to be protected until the numbness is gone and the strength has returned. Because you  cannot feel it, you will need to take extra care to avoid injury. Because it may be weak, you may have difficulty moving it or using it. You may not know what position it is in without looking at it while the block is in effect.  3. For blocks in the legs and feet, returning to weight bearing and walking needs to be done carefully. You will need to wait until the numbness is entirely gone and the strength has returned. You should be able to move your leg and foot normally before you try and bear weight or walk. You will need someone to be with you  when you first try to ensure you do not fall and possibly risk injury.  4. Bruising and tenderness at the needle site are common side effects and will resolve in a few days.  5. Persistent numbness or new problems with movement should be communicated to the surgeon or the [facility] [phone] [facility] [phone]).

## 2023-07-14 NOTE — H&P (Signed)
 PREOPERATIVE H&P  Chief Complaint: RIGHT DISTAL HUMERUS FX  HPI: Michelle Johnston is a 73 y.o. female who is scheduled for, Procedure(s): OPEN REDUCTION INTERNAL FIXATION (ORIF) DISTAL HUMERUS FRACTURE.   Patient has a past medical history significant for HTN and sleep apnea.   Patient had a fall on 4/30 where she landed directly on the right elbow. She had immediate pain.   Symptoms are rated as moderate to severe, and have been worsening.  This is significantly impairing activities of daily living.    Please see clinic note for further details on this patient's care.    She has elected for surgical management.   Past Medical History:  Diagnosis Date   Anxiety    Arthritis    Bradycardia    Bronchitis    Depression    Dyspnea    GERD (gastroesophageal reflux disease)    Glaucoma    Hypertension    Presence of Watchman left atrial appendage closure device 08/07/2021   Watchman FLX 27mm with Dr. Marven Slimmer   Seasonal allergies    Sleep apnea    does not use CPAP   Wears glasses    Past Surgical History:  Procedure Laterality Date   ABDOMINAL HYSTERECTOMY  1990   ATRIAL FIBRILLATION ABLATION N/A 08/12/2020   Procedure: ATRIAL FIBRILLATION ABLATION;  Surgeon: Boyce Byes, MD;  Location: [facility];  Service: Cardiovascular;  Laterality: N/A;   ATRIAL FIBRILLATION ABLATION N/A 03/26/2023   Procedure: ATRIAL FIBRILLATION ABLATION;  Surgeon: Boyce Byes, MD;  Location: [facility];  Service: Cardiovascular;  Laterality: N/A;   BREAST BIOPSY Right    benign   CARDIOVERSION N/A 08/15/2019   Procedure: CARDIOVERSION;  Surgeon: Cody Das, MD;  Location: [facility];  Service: Cardiovascular;  Laterality: N/A;   COLONOSCOPY     LEFT ATRIAL APPENDAGE OCCLUSION N/A 08/07/2021   Procedure: LEFT ATRIAL APPENDAGE OCCLUSION;  Surgeon: Boyce Byes, MD;  Location: [facility];  Service: Cardiovascular;  Laterality: N/A;   meniscus tear  knee     left knee   NASAL SINUS SURGERY  1975   ORIF ANKLE FRACTURE  2009   left   TEE WITHOUT CARDIOVERSION N/A 08/07/2021   Procedure: TRANSESOPHAGEAL ECHOCARDIOGRAM (TEE);  Surgeon: Boyce Byes, MD;  Location: [facility]IVE CV LAB;  Service: Cardiovascular;  Laterality: N/A;   TOTAL KNEE ARTHROPLASTY Left 02/03/2019   Procedure: LEFT TOTAL KNEE ARTHROPLASTY;  Surgeon: Arnie Lao, MD;  Location: [facility];  Service: Orthopedics;  Laterality: Left;   TURBINATE REDUCTION Bilateral 06/04/2014   Procedure: BILATERAL TURBINATE REDUCTION;  Surgeon: Reynold Caves, MD;  Location: [facility];  Service: ENT;  Laterality: Bilateral;   TYMPANOSTOMY TUBE PLACEMENT     left   Social History   Socioeconomic History   Marital status: Married    Spouse name: Not on file   Number of children: 0   Years of education: Not on file   Highest education level: Not on file  Occupational History   Not on file  Tobacco Use   Smoking status: Former    Current packs/day: 0.00    Average packs/day: 2.0 packs/day for 30.0 years (60.0 ttl pk-yrs)    Types: Cigarettes    Start date: 05/27/1968    Quit date: 05/28/1998    Years since quitting: 25.1   Smokeless tobacco: Never  Vaping Use   Vaping status: Never Used  Substance and Sexual Activity  Alcohol use: Yes    Alcohol/week: 3.0 - 4.0 standard drinks of alcohol    Types: 2 Glasses of wine, 1 - 2 Cans of beer per week    Comment: daily-wine daily-1 glass   Drug use: No   Sexual activity: Not Currently    Comment: hyst  Other Topics Concern   Not on file  Social History Narrative   Not on file   Social Drivers of Health   Financial Resource Strain: Not on file  Food Insecurity: Not on file  Transportation Needs: Not on file  Physical Activity: Not on file  Stress: Not on file  Social Connections: Unknown (07/15/2021)   [facility]   Social Network    Social Network: Not on file   Family  History  Problem Relation Age of Onset   Hypertension Mother    Hyperlipidemia Mother    Lymphoma Mother    Lung cancer Father    Allergies  Allergen Reactions   Dexlansoprazole Other (See Comments)    ineffective   Flecainide      Pt had stroke Possible stroke   Nasal Spray     Other Reaction(s): Head Congestion   Other     (Nasal Steroid) head congestion   Zetia  [Ezetimibe ] Nausea Only   Cefaclor Hives and Rash   Ciprofibrate Rash    (see OV 12/24/2011)   Ciprofloxacin  Hives   Metronidazole  Hives    Other reaction(s): rash ?? (see OV 12/24/2011)   Prior to Admission medications   Medication Sig Start Date End Date Taking? Authorizing Provider  acetaminophen  (TYLENOL ) 500 MG tablet Take 1,000 mg by mouth every 6 (six) hours as needed for moderate pain (pain score 4-6).   Yes [provider]  amLODipine  (NORVASC ) 10 MG tablet Take 10 mg by mouth at bedtime.   Yes [provider]  aspirin  EC 81 MG tablet Take 1 tablet (81 mg total) by mouth daily. Swallow whole. 06/24/23  Yes Tylene Galla, PA-C  b complex vitamins tablet Take 1 tablet by mouth daily.   Yes [provider]  calcium  carbonate (OS-CAL - DOSED IN MG OF ELEMENTAL CALCIUM ) 1250 (500 Ca) MG tablet Take 1 tablet by mouth daily with breakfast.   Yes [provider]  carvedilol  (COREG ) 6.25 MG tablet TAKE 1 TABLET BY MOUTH TWICE DAILY WITH A MEAL 02/18/23  Yes Boyce Byes, MD  cetirizine (ZYRTEC) 10 MG tablet Take 10 mg by mouth at bedtime.   Yes [provider]  Cholecalciferol  (VITAMIN D) 50 MCG (2000 UT) tablet Take 2,000 Units by mouth daily.   Yes [provider]  cyanocobalamin  (VITAMIN B12) 1000 MCG/ML injection Inject 1,000 mcg into the muscle every Sunday. 05/29/22  Yes [provider]  FLUoxetine  (PROZAC ) 40 MG capsule Take 40 mg by mouth daily. 02/21/20  Yes [provider]  gabapentin  (NEURONTIN ) 300 MG capsule Take 300-600  mg by mouth See admin instructions. Take 300 mg in the morning and 600 mg at night 04/19/20  Yes [provider]  guaiFENesin (MUCINEX) 600 MG 12 hr tablet Take 600 mg by mouth daily as needed for cough or to loosen phlegm.   Yes [provider]  ipratropium (ATROVENT) 0.06 % nasal spray Place 1 spray into both nostrils daily as needed for rhinitis. 09/12/19  Yes [provider]  latanoprost  (XALATAN ) 0.005 % ophthalmic solution Place 1 drop into both eyes every morning.    Yes [provider]  meloxicam (MOBIC) 15 MG tablet Take 7.5 mg by mouth daily as needed for pain.   Yes [provider]  methylphenidate (RITALIN) 10 MG tablet Take 5 mg by mouth daily. 12/15/21  Yes [provider]  montelukast  (SINGULAIR ) 10 MG tablet Take 10 mg by mouth daily as needed (allergies).   Yes [provider]  Multiple Vitamins-Minerals (MULTIVITAMIN WITH MINERALS) tablet Take 1 tablet by mouth daily.   Yes [provider]  Omega-3 Fatty Acids (FISH OIL) 1000 MG CAPS Take 1,000 mg by mouth daily.   Yes [provider]  omeprazole (PRILOSEC) 20 MG capsule Take 20 mg by mouth daily as needed (acid reflux). 12/14/19  Yes [provider]  rosuvastatin  (CRESTOR ) 20 MG tablet Take 1 tablet by mouth once daily 04/14/23  Yes Wendie Hamburg, MD  vitamin C  (ASCORBIC ACID ) 500 MG tablet Take 500 mg by mouth daily.   Yes [provider]  albuterol  (VENTOLIN  HFA) 108 (90 Base) MCG/ACT inhaler Inhale 1-2 puffs into the lungs every 6 (six) hours as needed for wheezing or shortness of breath.    [provider]  nitroGLYCERIN  (NITROSTAT ) 0.4 MG SL tablet Place 0.4 mg under the tongue every 5 (five) minutes as needed for chest pain.    [provider]    ROS: All other systems have been reviewed and were otherwise negative with the exception of those mentioned in the HPI and as above.  Physical Exam: General:  Alert, no acute distress Cardiovascular: No pedal edema Respiratory: No cyanosis, no use of accessory musculature GI: No organomegaly, abdomen is soft and non-tender Skin: No lesions in the area of chief complaint Neurologic: Sensation intact distally Psychiatric: Patient is competent for consent with normal mood and affect Lymphatic: No axillary or cervical lymphadenopathy  MUSCULOSKELETAL:  Extensive bruising and swelling throughout the right elbow. Pain with range of motion. Distal motor and sensory function grossly intact.   Imaging: CT of the right elbow demonstrates a nondisplaced supracondylar distal humerus fracture  Assessment: RIGHT DISTAL HUMERUS FX  Plan: Plan for Procedure(s): OPEN REDUCTION INTERNAL FIXATION (ORIF) DISTAL HUMERUS FRACTURE  The risks benefits and alternatives were discussed with the patient including but not limited to the risks of nonoperative treatment, versus surgical intervention including infection, bleeding, nerve injury,  blood clots, cardiopulmonary complications, morbidity, mortality, among others, and they were willing to proceed.   The patient acknowledged the explanation, agreed to proceed with the plan and consent was signed.   Operative Plan: ORIF right distal humerus fracture Discharge Medications: standard DVT Prophylaxis: none Physical Therapy: Outpatient PT Special Discharge needs: Sling   Adine Ahmadi, PA-C  07/14/2023 2:43 PM

## 2023-07-15 ENCOUNTER — Other Ambulatory Visit: Payer: Self-pay

## 2023-07-15 ENCOUNTER — Encounter (HOSPITAL_BASED_OUTPATIENT_CLINIC_OR_DEPARTMENT_OTHER): Payer: Self-pay | Admitting: Orthopaedic Surgery

## 2023-07-15 ENCOUNTER — Ambulatory Visit (HOSPITAL_BASED_OUTPATIENT_CLINIC_OR_DEPARTMENT_OTHER): Admitting: Anesthesiology

## 2023-07-15 ENCOUNTER — Ambulatory Visit (HOSPITAL_COMMUNITY)

## 2023-07-15 ENCOUNTER — Encounter (HOSPITAL_BASED_OUTPATIENT_CLINIC_OR_DEPARTMENT_OTHER)
Admission: RE | Disposition: A | Discharge status: Home / Self Care | Payer: Self-pay | Source: Home / Self Care | Attending: Orthopaedic Surgery

## 2023-07-15 ENCOUNTER — Ambulatory Visit (HOSPITAL_BASED_OUTPATIENT_CLINIC_OR_DEPARTMENT_OTHER)
Admission: RE | Admit: 2023-07-15 | Discharge: 2023-07-15 | Disposition: A | Discharge status: Home / Self Care | Attending: Orthopaedic Surgery | Admitting: Orthopaedic Surgery

## 2023-07-15 DIAGNOSIS — F418 Other specified anxiety disorders: Secondary | ICD-10-CM | POA: Insufficient documentation

## 2023-07-15 DIAGNOSIS — I10 Essential (primary) hypertension: Secondary | ICD-10-CM | POA: Insufficient documentation

## 2023-07-15 DIAGNOSIS — G4730 Sleep apnea, unspecified: Secondary | ICD-10-CM | POA: Diagnosis not present

## 2023-07-15 DIAGNOSIS — I4819 Other persistent atrial fibrillation: Secondary | ICD-10-CM | POA: Diagnosis not present

## 2023-07-15 DIAGNOSIS — R011 Cardiac murmur, unspecified: Secondary | ICD-10-CM | POA: Insufficient documentation

## 2023-07-15 DIAGNOSIS — G8918 Other acute postprocedural pain: Secondary | ICD-10-CM | POA: Diagnosis not present

## 2023-07-15 DIAGNOSIS — I4891 Unspecified atrial fibrillation: Secondary | ICD-10-CM | POA: Insufficient documentation

## 2023-07-15 DIAGNOSIS — S42411A Displaced simple supracondylar fracture without intercondylar fracture of right humerus, initial encounter for closed fracture: Secondary | ICD-10-CM

## 2023-07-15 DIAGNOSIS — Z87891 Personal history of nicotine dependence: Secondary | ICD-10-CM

## 2023-07-15 DIAGNOSIS — Z8249 Family history of ischemic heart disease and other diseases of the circulatory system: Secondary | ICD-10-CM | POA: Insufficient documentation

## 2023-07-15 DIAGNOSIS — Z8673 Personal history of transient ischemic attack (TIA), and cerebral infarction without residual deficits: Secondary | ICD-10-CM | POA: Insufficient documentation

## 2023-07-15 DIAGNOSIS — M1990 Unspecified osteoarthritis, unspecified site: Secondary | ICD-10-CM | POA: Diagnosis not present

## 2023-07-15 DIAGNOSIS — W19XXXA Unspecified fall, initial encounter: Secondary | ICD-10-CM | POA: Diagnosis not present

## 2023-07-15 DIAGNOSIS — K219 Gastro-esophageal reflux disease without esophagitis: Secondary | ICD-10-CM | POA: Diagnosis not present

## 2023-07-15 DIAGNOSIS — H409 Unspecified glaucoma: Secondary | ICD-10-CM | POA: Diagnosis not present

## 2023-07-15 DIAGNOSIS — I080 Rheumatic disorders of both mitral and aortic valves: Secondary | ICD-10-CM | POA: Diagnosis not present

## 2023-07-15 DIAGNOSIS — J449 Chronic obstructive pulmonary disease, unspecified: Secondary | ICD-10-CM | POA: Diagnosis not present

## 2023-07-15 DIAGNOSIS — S42401A Unspecified fracture of lower end of right humerus, initial encounter for closed fracture: Secondary | ICD-10-CM | POA: Diagnosis not present

## 2023-07-15 DIAGNOSIS — Z79899 Other long term (current) drug therapy: Secondary | ICD-10-CM | POA: Diagnosis not present

## 2023-07-15 DIAGNOSIS — Z01818 Encounter for other preprocedural examination: Secondary | ICD-10-CM

## 2023-07-15 DIAGNOSIS — S42401D Unspecified fracture of lower end of right humerus, subsequent encounter for fracture with routine healing: Secondary | ICD-10-CM | POA: Diagnosis not present

## 2023-07-15 DIAGNOSIS — G5621 Lesion of ulnar nerve, right upper limb: Secondary | ICD-10-CM | POA: Diagnosis not present

## 2023-07-15 DIAGNOSIS — R609 Edema, unspecified: Secondary | ICD-10-CM | POA: Diagnosis not present

## 2023-07-15 HISTORY — DX: Gastro-esophageal reflux disease without esophagitis: K21.9

## 2023-07-15 HISTORY — DX: Unspecified atrial fibrillation: I48.91

## 2023-07-15 HISTORY — PX: ORIF HUMERUS FRACTURE: SHX2126

## 2023-07-15 SURGERY — OPEN REDUCTION INTERNAL FIXATION (ORIF) DISTAL HUMERUS FRACTURE
Anesthesia: General | Site: Arm Lower | Laterality: Right

## 2023-07-15 MED ORDER — LACTATED RINGERS IV SOLN: INTRAVENOUS | Status: DC

## 2023-07-15 MED ORDER — OXYCODONE HCL 5 MG PO TABS: 5.0000 mg | ORAL_TABLET | Freq: Once | ORAL | Status: DC | PRN

## 2023-07-15 MED ORDER — ACETAMINOPHEN 500 MG PO TABS
1000.0000 mg | ORAL_TABLET | Freq: Three times a day (TID) | ORAL | 0 refills | Status: AC
Start: 2023-07-15 — End: 2023-07-29

## 2023-07-15 MED ORDER — PROPOFOL 10 MG/ML IV BOLUS
INTRAVENOUS | Status: DC | PRN
  Administered 2023-07-15: 100 ug via INTRAVENOUS

## 2023-07-15 MED ORDER — LACTATED RINGERS IV SOLN: INTRAVENOUS | Status: DC | PRN

## 2023-07-15 MED ORDER — CEFAZOLIN SODIUM-DEXTROSE 2-4 GM/100ML-% IV SOLN
2.0000 g | INTRAVENOUS | Status: AC
  Administered 2023-07-15: 2 g via INTRAVENOUS

## 2023-07-15 MED ORDER — FENTANYL CITRATE (PF) 100 MCG/2ML IJ SOLN
100.0000 ug | Freq: Once | INTRAMUSCULAR | Status: AC
  Administered 2023-07-15 (×4): 25 ug via INTRAVENOUS
  Administered 2023-07-15: 100 ug via INTRAVENOUS

## 2023-07-15 MED ORDER — VANCOMYCIN HCL 500 MG IV SOLR
INTRAVENOUS | Status: AC
  Filled 2023-07-15: qty 20

## 2023-07-15 MED ORDER — ACETAMINOPHEN 500 MG PO TABS
1000.0000 mg | ORAL_TABLET | Freq: Once | ORAL | Status: AC
  Administered 2023-07-15: 1000 mg via ORAL

## 2023-07-15 MED ORDER — MIDAZOLAM HCL 2 MG/2ML IJ SOLN
INTRAMUSCULAR | Status: AC
  Filled 2023-07-15: qty 2

## 2023-07-15 MED ORDER — FENTANYL CITRATE (PF) 100 MCG/2ML IJ SOLN
INTRAMUSCULAR | Status: AC
  Filled 2023-07-15: qty 2

## 2023-07-15 MED ORDER — LIDOCAINE 2% (20 MG/ML) 5 ML SYRINGE
INTRAMUSCULAR | Status: AC
  Filled 2023-07-15: qty 5

## 2023-07-15 MED ORDER — OXYCODONE HCL 5 MG PO TABS: ORAL_TABLET | ORAL | 0 refills | Status: AC

## 2023-07-15 MED ORDER — LIDOCAINE 2% (20 MG/ML) 5 ML SYRINGE
INTRAMUSCULAR | Status: DC | PRN
  Administered 2023-07-15: 60 mg via INTRAVENOUS

## 2023-07-15 MED ORDER — ONDANSETRON HCL 4 MG PO TABS: 4.0000 mg | ORAL_TABLET | Freq: Three times a day (TID) | ORAL | 0 refills | Status: AC | PRN

## 2023-07-15 MED ORDER — VANCOMYCIN HCL 1000 MG IV SOLR
INTRAVENOUS | Status: AC
  Filled 2023-07-15: qty 20

## 2023-07-15 MED ORDER — MIDAZOLAM HCL 2 MG/2ML IJ SOLN: 0.5000 mg | Freq: Once | INTRAMUSCULAR | Status: DC | PRN

## 2023-07-15 MED ORDER — PHENYLEPHRINE 80 MCG/ML (10ML) SYRINGE FOR IV PUSH (FOR BLOOD PRESSURE SUPPORT)
PREFILLED_SYRINGE | INTRAVENOUS | Status: AC
  Filled 2023-07-15: qty 10

## 2023-07-15 MED ORDER — EPHEDRINE SULFATE-NACL 50-0.9 MG/10ML-% IV SOSY
PREFILLED_SYRINGE | INTRAVENOUS | Status: DC | PRN
  Administered 2023-07-15: 10 mg via INTRAVENOUS

## 2023-07-15 MED ORDER — MELOXICAM 15 MG PO TABS: 15.0000 mg | ORAL_TABLET | Freq: Every day | ORAL | 0 refills | Status: DC

## 2023-07-15 MED ORDER — ONDANSETRON HCL 4 MG/2ML IJ SOLN
INTRAMUSCULAR | Status: AC
  Filled 2023-07-15: qty 2

## 2023-07-15 MED ORDER — EPHEDRINE 5 MG/ML INJ
INTRAVENOUS | Status: AC
  Filled 2023-07-15: qty 5

## 2023-07-15 MED ORDER — SUGAMMADEX SODIUM 200 MG/2ML IV SOLN
INTRAVENOUS | Status: DC | PRN
  Administered 2023-07-15: 200 mg via INTRAVENOUS

## 2023-07-15 MED ORDER — VANCOMYCIN HCL 1 G IV SOLR
INTRAVENOUS | Status: DC | PRN
  Administered 2023-07-15: 1000 mg via TOPICAL

## 2023-07-15 MED ORDER — BUPIVACAINE-EPINEPHRINE (PF) 0.5% -1:200000 IJ SOLN
INTRAMUSCULAR | Status: DC | PRN
  Administered 2023-07-15: 10 mL via PERINEURAL

## 2023-07-15 MED ORDER — HYDROMORPHONE HCL 1 MG/ML IJ SOLN
0.2500 mg | INTRAMUSCULAR | Status: DC | PRN
  Administered 2023-07-15 (×2): 0.5 mg via INTRAVENOUS

## 2023-07-15 MED ORDER — OXYCODONE HCL 5 MG/5ML PO SOLN: 5.0000 mg | Freq: Once | ORAL | Status: DC | PRN

## 2023-07-15 MED ORDER — ROCURONIUM BROMIDE 10 MG/ML (PF) SYRINGE
PREFILLED_SYRINGE | INTRAVENOUS | Status: AC
  Filled 2023-07-15: qty 10

## 2023-07-15 MED ORDER — PHENYLEPHRINE 80 MCG/ML (10ML) SYRINGE FOR IV PUSH (FOR BLOOD PRESSURE SUPPORT)
PREFILLED_SYRINGE | INTRAVENOUS | Status: DC | PRN
  Administered 2023-07-15: 80 ug via INTRAVENOUS

## 2023-07-15 MED ORDER — CEFAZOLIN SODIUM-DEXTROSE 2-4 GM/100ML-% IV SOLN
INTRAVENOUS | Status: AC
  Filled 2023-07-15: qty 100

## 2023-07-15 MED ORDER — ONDANSETRON HCL 4 MG/2ML IJ SOLN
INTRAMUSCULAR | Status: DC | PRN
  Administered 2023-07-15: 4 mg via INTRAVENOUS

## 2023-07-15 MED ORDER — FENTANYL CITRATE (PF) 100 MCG/2ML IJ SOLN
100.0000 ug | Freq: Once | INTRAMUSCULAR | Status: AC
  Administered 2023-07-15: 50 ug via INTRAVENOUS

## 2023-07-15 MED ORDER — 0.9 % SODIUM CHLORIDE (POUR BTL) OPTIME
TOPICAL | Status: DC | PRN
  Administered 2023-07-15: 1000 mL

## 2023-07-15 MED ORDER — ACETAMINOPHEN 500 MG PO TABS
ORAL_TABLET | ORAL | Status: AC
  Filled 2023-07-15: qty 2

## 2023-07-15 MED ORDER — HYDROMORPHONE HCL 1 MG/ML IJ SOLN
INTRAMUSCULAR | Status: AC
  Filled 2023-07-15: qty 0.5

## 2023-07-15 MED ORDER — BUPIVACAINE LIPOSOME 1.3 % IJ SUSP
INTRAMUSCULAR | Status: DC | PRN
  Administered 2023-07-15: 10 mL via PERINEURAL

## 2023-07-15 MED ORDER — ROCURONIUM 10MG/ML (10ML) SYRINGE FOR MEDFUSION PUMP - OPTIME
INTRAVENOUS | Status: DC | PRN
  Administered 2023-07-15: 50 mg via INTRAVENOUS

## 2023-07-15 MED ORDER — GABAPENTIN 300 MG PO CAPS
ORAL_CAPSULE | ORAL | Status: AC
  Filled 2023-07-15: qty 1

## 2023-07-15 MED ORDER — GABAPENTIN 300 MG PO CAPS
300.0000 mg | ORAL_CAPSULE | Freq: Once | ORAL | Status: AC
  Administered 2023-07-15: 300 mg via ORAL

## 2023-07-15 SURGICAL SUPPLY — 65 items
4.0 FT 24 screw ×1 IMPLANT
BIT DRILL 2.5MM SMALL QC EVOS (BIT) ×2 IMPLANT
BIT DRILL QC 2.0 SHORT EVOS SM (DRILL) IMPLANT
BIT DRILL QC 2.5MM SHRT EVO SM (DRILL) IMPLANT
BLADE HEX COATED 2.75 (ELECTRODE) ×1 IMPLANT
BLADE SURG 10 STRL SS (BLADE) ×1 IMPLANT
BLADE SURG 15 STRL LF DISP TIS (BLADE) ×1 IMPLANT
BNDG ELASTIC 4INX 5YD STR LF (GAUZE/BANDAGES/DRESSINGS) ×1 IMPLANT
CHLORAPREP W/TINT 26 (MISCELLANEOUS) ×1 IMPLANT
CLSR STERI-STRIP ANTIMIC 1/2X4 (GAUZE/BANDAGES/DRESSINGS) ×1 IMPLANT
CUFF TOURN SGL QUICK 18X3 (MISCELLANEOUS) ×1 IMPLANT
CUFF TRNQT CYL 24X4X16.5-23 (TOURNIQUET CUFF) IMPLANT
DRAPE C-ARM 42X72 X-RAY (DRAPES) IMPLANT
DRAPE C-ARMOR (DRAPES) IMPLANT
DRAPE EXTREMITY T 121X128X90 (DISPOSABLE) ×1 IMPLANT
DRAPE INCISE IOBAN 66X45 STRL (DRAPES) IMPLANT
DRAPE OEC MINIVIEW 54X84 (DRAPES) IMPLANT
DRAPE STERI 35X30 U-POUCH (DRAPES) IMPLANT
DRAPE U-SHAPE 47X51 STRL (DRAPES) ×1 IMPLANT
ELECTRODE BLDE 4.0 EZ CLN MEGD (MISCELLANEOUS) IMPLANT
ELECTRODE REM PT RTRN 9FT ADLT (ELECTROSURGICAL) ×1 IMPLANT
EXTENSION HOSE W/PLC CONNECTON (MISCELLANEOUS) IMPLANT
GAUZE SPONGE 4X4 12PLY STRL (GAUZE/BANDAGES/DRESSINGS) ×1 IMPLANT
GLOVE BIO SURGEON STRL SZ 6.5 (GLOVE) ×1 IMPLANT
GLOVE BIOGEL PI IND STRL 6.5 (GLOVE) ×1 IMPLANT
GLOVE BIOGEL PI IND STRL 8 (GLOVE) ×1 IMPLANT
GLOVE ECLIPSE 8.0 STRL XLNG CF (GLOVE) ×1 IMPLANT
GOWN STRL REUS W/ TWL LRG LVL3 (GOWN DISPOSABLE) ×2 IMPLANT
GOWN STRL REUS W/TWL XL LVL3 (GOWN DISPOSABLE) ×1 IMPLANT
KWIRE FX150X1.6XTROC PNT (WIRE) IMPLANT
LOOP VASCLR MAXI BLUE 18IN ST (MISCELLANEOUS) IMPLANT
LOOPS VASCLR MAXI BLUE 18IN ST (MISCELLANEOUS) IMPLANT
NS IRRIG 1000ML POUR BTL (IV SOLUTION) ×1 IMPLANT
PACK ARTHROSCOPY DSU (CUSTOM PROCEDURE TRAY) ×1 IMPLANT
PACK BASIN DAY SURGERY FS (CUSTOM PROCEDURE TRAY) ×1 IMPLANT
PAD CAST 4YDX4 CTTN HI CHSV (CAST SUPPLIES) ×1 IMPLANT
PENCIL SMOKE EVACUATOR (MISCELLANEOUS) ×1 IMPLANT
PLATE HUM EVOS 2.7X80 3H (Plate) IMPLANT
PLATE HUM EVOS 6H R 2.7X85 (Plate) IMPLANT
SCREW CORT 2.7X16 STAR T8 EVOS (Screw) IMPLANT
SCREW CORT 3.5X20 ST EVOS (Screw) IMPLANT
SCREW CORT 3.5X22 ST EVOS (Screw) IMPLANT
SCREW EVOS 2.7X18 LOCK T8 (Screw) IMPLANT
SCREW LOCK EVOS ST 3.5X18 (Screw) IMPLANT
SCREW LOCK ST EVOS 2.7X20 (Screw) IMPLANT
SCREW LOCK ST EVOS 2.7X22 (Screw) IMPLANT
SCREW LOCK ST EVOS 2.7X24 (Screw) IMPLANT
SCREW LOCK T8 38X2.7XST (Screw) IMPLANT
SCREW OST EVOS FT 4X24 (Screw) IMPLANT
SHEET MEDIUM DRAPE 40X70 STRL (DRAPES) ×1 IMPLANT
SLEEVE SCD COMPRESS KNEE MED (STOCKING) IMPLANT
SLING ARM FOAM STRAP LRG (SOFTGOODS) ×1 IMPLANT
SPIKE FLUID TRANSFER (MISCELLANEOUS) IMPLANT
SPLINT PLASTER CAST FAST 5X30 (CAST SUPPLIES) ×10 IMPLANT
SPONGE T-LAP 18X18 ~~LOC~~+RFID (SPONGE) ×1 IMPLANT
SUCTION TUBE FRAZIER 10FR DISP (SUCTIONS) IMPLANT
SUT MNCRL AB 4-0 PS2 18 (SUTURE) IMPLANT
SUT VIC AB 0 CT1 27XBRD ANBCTR (SUTURE) ×1 IMPLANT
SUT VIC AB 1 CT1 27XBRD ANBCTR (SUTURE) IMPLANT
SUT VIC AB 2-0 CT1 TAPERPNT 27 (SUTURE) IMPLANT
SUT VIC AB 2-0 SH 27XBRD (SUTURE) ×1 IMPLANT
SUT VIC AB 3-0 SH 27X BRD (SUTURE) IMPLANT
TOWEL GREEN STERILE FF (TOWEL DISPOSABLE) ×1 IMPLANT
TUBE CONNECTING 20X1/4 (TUBING) ×1 IMPLANT
TUBE SUCTION HIGH CAP CLEAR NV (SUCTIONS) ×1 IMPLANT

## 2023-07-15 NOTE — Interval H&P Note (Signed)
 All questions answered, patient wants to proceed with procedure. ? ?

## 2023-07-15 NOTE — Op Note (Signed)
 Orthopaedic Surgery Operative Note (CSN: [id number])  Michelle Johnston  04-01-1950 Date of Surgery: 07/15/2023   Diagnoses:  RIGHT DISTAL HUMERUS FRACTURE  Procedure: Right supracondylar humerus fracture Ulnar nerve neurolysis   Operative Finding Successful completion of the planned procedure.  Bone quality was moderate but secondary to the patient's poor bone quality we felt that medial and lateral plating would allow her to use the arm early.  Post-operative plan: The patient will be <10lb weight limit and ok for use for ADLs.  The patient will be dc home.  DVT prophylaxis not indicated in this ambulatory upper extremity patient without significant risk factors.   Pain control with PRN pain medication preferring oral medicines.  Follow up plan will be scheduled in approximately 7 days for incision check and XR.  Post-Op Diagnosis: Same Surgeons:Primary: Micheline Ahr, MD Assistants:Caroline McBane, PA-C and Rozanna Corner, RNFA Location: [facility] OR ROOM 6 Anesthesia: General with regional anesthesia Antibiotics: Ancef  2 g with local vancomycin  powder 1 g at the surgical site Tourniquet time:  Estimated Blood Loss: 50 Complications: None Specimens: None Implants: Implant Name Type Inv. Item Serial No. Manufacturer Lot No. LRB No. Used Action  SCREW CORT 3.5X20 ST EVOS - [id number] Screw SCREW CORT 3.5X20 ST EVOS  [employer] ON STERILE TRAY Right 1 Implanted  SCREW EVOS 2.7X18 LOCK T8 - [id number] Screw SCREW EVOS 2.7X18 LOCK T8  [employer] ON STERILE TRAY Right 1 Implanted  SCREW LOCK ST EVOS 2.7X20 - [id number] Screw SCREW LOCK ST EVOS 2.7X20  [employer] ON STERILE TRAY Right 1 Implanted  SCREW LOCK ST EVOS 2.7X24 - [id number] Screw SCREW LOCK ST EVOS 2.7X24  [employer] ON STERILE TRAY Right 1 Implanted  SCREW LOCK ST EVOS 2.7X22 - [id number] Screw SCREW LOCK ST EVOS 2.7X22  [employer] ON STERILE TRAY  Right 1 Implanted  SCREW LOCK EVOS ST 3.5X18 - [id number] Screw SCREW LOCK EVOS ST 3.5X18  [employer] ON STERILE TRAY Right 1 Implanted  PLATE HUM EVOS 6H R 2.7X85 - [id number] Plate PLATE HUM EVOS 6H R 2.7X85  [employer] ON STERILE TRAY Right 1 Implanted  SCREW CORT 3.5X22 ST EVOS - [id number] Screw SCREW CORT 3.5X22 ST EVOS  [employer] ON STERILE TRAY Right 1 Implanted  2.7 38mm kicking screw    [employer] ON STERILE TRAY Right 1 Implanted  PLATE HUM EVOS 6.3K16 3H - [id number] Plate PLATE HUM EVOS 3.2K02 3H  [employer] ON STERILE TRAY Right 1 Implanted  SCREW CORT 2.7X16 STAR T8 EVOS - [id number] Screw SCREW CORT 2.7X16 STAR T8 EVOS  [employer] ON STERILE TRAY Right 1 Implanted  4.0 FT 24 screw    [employer]  Right 1 Implanted    Indications for Surgery:   Michelle Johnston is a 73 y.o. female with fracture of supracondylar humerus.  Benefits and risks of operative and nonoperative management were discussed prior to surgery with patient/guardian(s) and informed consent form was completed.  Specific risks including infection, need for additional surgery, non-union, NV damage amongst others   Procedure:   The patient was identified properly. Informed consent was obtained and the surgical site was marked. The patient was taken up to suite where general anesthesia was induced.  The patient was positioned lateral.  The right elbowwas prepped and draped in the usual sterile fashion.  Timeout  was performed before the beginning of the case.  We began by marking our bony landmarks and made a posterior longitudinal excision over the triceps itself.  We identified the triceps fascia and open this.  We made medial and lateral paratricipital approaches.  Ulnar nerve was found medially, decompressed and moved anteriorly.  We then were able to identify the fracture.   We identified the  neurovascular bundle in the form of the radial nerve and radial artery neurolysed these from the lateral septum around the posterior aspect of the humerus placing a Penrose drain with a tag stitch to identify this bundle later and mobilize it.   We started with a posterior lateral plate.  We are able to correctly place the plate under direct and fluoroscopic visualization.  Once we had obtained appropriate reduction and placement of the plate we filled the plate with 3 distal screws as well as 3 proximal screws at the fracture site.  As we hoped to allow the patient to use the arm early for ADLs we then placed a medial plate in the medial pair of her tricipital window.  We obtained good placement on fluoroscopic visualization and placed screws proximal and distal to the fracture site.  Final construct moves unit and there was no sign of screw perforation to the joint.  We irrigated the wound copiously before placing local antibiotic as listed above.  We closed the incision in a multilayer fashion with absorbable suture.  Sterile dressing was placed.  Patient was awoken taken to PACU in stable condition.  Nicholas Bari, PA-C, present and scrubbed throughout the case, critical for completion in a timely fashion, and for retraction, instrumentation, closure.

## 2023-07-15 NOTE — Anesthesia Preprocedure Evaluation (Addendum)
 Anesthesia Evaluation  Patient identified by MRN, date of birth, ID band Patient awake    Reviewed: Allergy & Precautions, NPO status , Patient's Chart, lab work & pertinent test results, reviewed documented beta blocker date and time   History of Anesthesia Complications Negative for: history of anesthetic complications  Airway Mallampati: II  TM Distance: >3 FB Neck ROM: Full    Dental  (+) Dental Advisory Given, Caps   Pulmonary sleep apnea (does not use CPAP) , COPD,  COPD inhaler, former smoker   breath sounds clear to auscultation       Cardiovascular hypertension, Pt. on medications and Pt. on home beta blockers (-) angina + dysrhythmias (Watchman appendage occlusion, cardioversion, ablation) Atrial Fibrillation + Valvular Problems/Murmurs (severe) AI  Rhythm:Regular Rate:Normal  11/2022 ECHO: EF 55 to 60%. 1. The LV has normal function, no regional wall motion abnormalities. There is mild LVH  2. RVF is normal. The right ventricular size is normal.   3. Left atrial size was severely dilated.   4. The mitral valve is normal in structure. Mild mitral valve regurgitation.   5. Compared to echo from May 2023, AI is without significant change. The  aortic valve is tricuspid. Aortic valve regurgitation is moderate to severe.     Neuro/Psych   Anxiety Depression    glaucoma TIA   GI/Hepatic Neg liver ROS,GERD  Medicated and Controlled,,  Endo/Other  negative endocrine ROS    Renal/GU negative Renal ROS     Musculoskeletal  (+) Arthritis ,    Abdominal   Peds  Hematology Xarelto : none x 3 weeks   Anesthesia Other Findings   Reproductive/Obstetrics                             Anesthesia Physical Anesthesia Plan  ASA: 3  Anesthesia Plan: General   Post-op Pain Management: Regional block* and Tylenol  PO (pre-op)*   Induction: Intravenous  PONV Risk Score and Plan: 3 and Ondansetron ,  Dexamethasone  and Treatment may vary due to age or medical condition  Airway Management Planned: Oral ETT  Additional Equipment: None  Intra-op Plan:   Post-operative Plan: Extubation in OR  Informed Consent: I have reviewed the patients History and Physical, chart, labs and discussed the procedure including the risks, benefits and alternatives for the proposed anesthesia with the patient or authorized representative who has indicated his/her understanding and acceptance.     Dental advisory given  Plan Discussed with: CRNA and Surgeon  Anesthesia Plan Comments: (Plan GETA with interscalene block for post op analgesia)        Anesthesia Quick Evaluation

## 2023-07-15 NOTE — Anesthesia Procedure Notes (Signed)
 Anesthesia Regional Block: Interscalene brachial plexus block   Pre-Anesthetic Checklist: , timeout performed,  Correct Patient, Correct Site, Correct Laterality,  Correct Procedure, Correct Position, site marked,  Risks and benefits discussed,  Surgical consent,  Pre-op evaluation,  At surgeon's request and post-op pain management  Laterality: Right and Upper  Prep: chloraprep       Needles:  Injection technique: Single-shot  Needle Type: Echogenic Needle     Needle Length: 9cm  Needle Gauge: 21     Additional Needles:   Procedures:,,,, ultrasound used (permanent image in chart),,    Narrative:  Start time: 07/15/2023 11:44 AM End time: 07/15/2023 11:50 AM Injection made incrementally with aspirations every 5 mL.  Performed by: Personally  Anesthesiologist: Jonne Netters, MD  Additional Notes: Pt identified in Holding room.  Monitors applied. Working IV access confirmed. Timeout, Sterile prep R clavicle and neck.  #21ga ECHOgenic Arrow block needle to interscalene brachial plexus with US  guidance.  10cc 0.5% Bupivacaine  1:200k epi, Exparel  injected incrementally after negative test dose.  Patient asymptomatic, VSS, no heme aspirated, tolerated well.   Fay Hoop, MD

## 2023-07-15 NOTE — Anesthesia Postprocedure Evaluation (Signed)
 Anesthesia Post Note  Patient: Michelle Johnston  Procedure(s) Performed: OPEN REDUCTION INTERNAL FIXATION (ORIF) DISTAL HUMERUS FRACTURE (Right: Arm Lower)     Patient location during evaluation: PACU Anesthesia Type: General Level of consciousness: sedated and patient cooperative Pain management: pain level controlled Vital Signs Assessment: post-procedure vital signs reviewed and stable Respiratory status: spontaneous breathing Cardiovascular status: stable Anesthetic complications: no   No notable events documented.  Last Vitals:  Vitals:   07/15/23 1530 07/15/23 1600  BP:    Pulse:    Resp:    Temp:    SpO2: 92% 95%    Last Pain:  Vitals:   07/15/23 1600  TempSrc:   PainSc: 3                  Gorman Laughter

## 2023-07-15 NOTE — Anesthesia Procedure Notes (Signed)
 Procedure Name: Intubation Date/Time: 07/15/2023 12:35 PM  Performed by: Darcel Early, CRNAPre-anesthesia Checklist: Patient identified, Emergency Drugs available, Suction available and Patient being monitored Patient Re-evaluated:Patient Re-evaluated prior to induction Oxygen Delivery Method: Circle system utilized Preoxygenation: Pre-oxygenation with 100% oxygen Induction Type: IV induction Ventilation: Mask ventilation without difficulty Laryngoscope Size: Mac and 3 Grade View: Grade I Tube type: Oral Tube size: 7.0 mm Number of attempts: 1 Airway Equipment and Method: Stylet Placement Confirmation: ETT inserted through vocal cords under direct vision, positive ETCO2 and breath sounds checked- equal and bilateral Secured at: 22 cm Tube secured with: Tape Dental Injury: Teeth and Oropharynx as per pre-operative assessment

## 2023-07-15 NOTE — Progress Notes (Signed)
Assisted Dr. Carswell Jackson with right, interscalene , ultrasound guided block. Side rails up, monitors on throughout procedure. See vital signs in flow sheet. Tolerated Procedure well. 

## 2023-07-15 NOTE — Transfer of Care (Signed)
 Immediate Anesthesia Transfer of Care Note  Patient: Michelle Johnston  Procedure(s) Performed: OPEN REDUCTION INTERNAL FIXATION (ORIF) DISTAL HUMERUS FRACTURE (Right: Arm Lower)  Patient Location: PACU  Anesthesia Type:GA combined with regional for post-op pain  Level of Consciousness: awake, alert , oriented, and patient cooperative  Airway & Oxygen Therapy: Patient Spontanous Breathing and Patient connected to face mask oxygen  Post-op Assessment: Report given to RN, Post -op Vital signs reviewed and stable, and Patient moving all extremities  Post vital signs: Reviewed and stable  Last Vitals:  Vitals Value Taken Time  BP 145/79 07/15/23 1430  Temp 36.2 C 07/15/23 1426  Pulse 65 07/15/23 1433  Resp 16 07/15/23 1433  SpO2 97 % 07/15/23 1433  Vitals shown include unfiled device data.  Last Pain:  Vitals:   07/15/23 0955  TempSrc: Temporal  PainSc: 6          Complications: No notable events documented.

## 2023-07-16 ENCOUNTER — Encounter (HOSPITAL_BASED_OUTPATIENT_CLINIC_OR_DEPARTMENT_OTHER): Payer: Self-pay | Admitting: Orthopaedic Surgery

## 2023-07-19 ENCOUNTER — Encounter (HOSPITAL_BASED_OUTPATIENT_CLINIC_OR_DEPARTMENT_OTHER): Payer: Self-pay | Admitting: Orthopaedic Surgery

## 2023-07-19 DIAGNOSIS — M79641 Pain in right hand: Secondary | ICD-10-CM | POA: Diagnosis not present

## 2023-07-19 DIAGNOSIS — M25511 Pain in right shoulder: Secondary | ICD-10-CM | POA: Diagnosis not present

## 2023-07-19 DIAGNOSIS — M25521 Pain in right elbow: Secondary | ICD-10-CM | POA: Diagnosis not present

## 2023-07-21 ENCOUNTER — Encounter (HOSPITAL_BASED_OUTPATIENT_CLINIC_OR_DEPARTMENT_OTHER): Payer: Self-pay | Admitting: Orthopaedic Surgery

## 2023-07-21 DIAGNOSIS — I10 Essential (primary) hypertension: Secondary | ICD-10-CM | POA: Diagnosis not present

## 2023-07-21 DIAGNOSIS — I4891 Unspecified atrial fibrillation: Secondary | ICD-10-CM | POA: Diagnosis not present

## 2023-07-21 DIAGNOSIS — J42 Unspecified chronic bronchitis: Secondary | ICD-10-CM | POA: Diagnosis not present

## 2023-07-21 DIAGNOSIS — F331 Major depressive disorder, recurrent, moderate: Secondary | ICD-10-CM | POA: Diagnosis not present

## 2023-07-21 DIAGNOSIS — R351 Nocturia: Secondary | ICD-10-CM | POA: Diagnosis not present

## 2023-07-21 DIAGNOSIS — E7800 Pure hypercholesterolemia, unspecified: Secondary | ICD-10-CM | POA: Diagnosis not present

## 2023-07-21 DIAGNOSIS — M8580 Other specified disorders of bone density and structure, unspecified site: Secondary | ICD-10-CM | POA: Diagnosis not present

## 2023-07-21 DIAGNOSIS — Z95818 Presence of other cardiac implants and grafts: Secondary | ICD-10-CM | POA: Diagnosis not present

## 2023-07-23 DIAGNOSIS — M25521 Pain in right elbow: Secondary | ICD-10-CM | POA: Diagnosis not present

## 2023-07-30 DIAGNOSIS — M25521 Pain in right elbow: Secondary | ICD-10-CM | POA: Diagnosis not present

## 2023-08-11 ENCOUNTER — Other Ambulatory Visit: Payer: Self-pay

## 2023-08-11 ENCOUNTER — Ambulatory Visit: Attending: Physician Assistant | Admitting: Physical Therapy

## 2023-08-11 DIAGNOSIS — S42411D Displaced simple supracondylar fracture without intercondylar fracture of right humerus, subsequent encounter for fracture with routine healing: Secondary | ICD-10-CM | POA: Insufficient documentation

## 2023-08-11 DIAGNOSIS — M25552 Pain in left hip: Secondary | ICD-10-CM | POA: Insufficient documentation

## 2023-08-11 DIAGNOSIS — M25521 Pain in right elbow: Secondary | ICD-10-CM | POA: Insufficient documentation

## 2023-08-11 DIAGNOSIS — M6258 Muscle wasting and atrophy, not elsewhere classified, other site: Secondary | ICD-10-CM | POA: Diagnosis not present

## 2023-08-11 DIAGNOSIS — S39013D Strain of muscle, fascia and tendon of pelvis, subsequent encounter: Secondary | ICD-10-CM | POA: Diagnosis not present

## 2023-08-11 DIAGNOSIS — M25611 Stiffness of right shoulder, not elsewhere classified: Secondary | ICD-10-CM | POA: Insufficient documentation

## 2023-08-11 DIAGNOSIS — R600 Localized edema: Secondary | ICD-10-CM | POA: Diagnosis not present

## 2023-08-11 DIAGNOSIS — X58XXXD Exposure to other specified factors, subsequent encounter: Secondary | ICD-10-CM | POA: Diagnosis not present

## 2023-08-11 DIAGNOSIS — R2689 Other abnormalities of gait and mobility: Secondary | ICD-10-CM | POA: Insufficient documentation

## 2023-08-11 DIAGNOSIS — M25621 Stiffness of right elbow, not elsewhere classified: Secondary | ICD-10-CM | POA: Insufficient documentation

## 2023-08-11 DIAGNOSIS — M6281 Muscle weakness (generalized): Secondary | ICD-10-CM | POA: Diagnosis not present

## 2023-08-11 NOTE — Therapy (Signed)
 OUTPATIENT PHYSICAL THERAPY SHOULDER EVALUATION   Patient Name: Michelle Johnston MRN: [id number] DOB:[birth year], 73 y.o., female Today's Date: 08/11/2023  END OF SESSION:  PT End of Session - 08/11/23 0803     Visit Number 1    Date for PT Re-Evaluation 10/06/23    Authorization Type [employer] will submit    PT Start Time 0803    PT Stop Time 0844    PT Time Calculation (min) 41 min    Activity Tolerance Patient tolerated treatment well             Past Medical History:  Diagnosis Date   Anxiety    Arthritis    Atrial fibrillation (HCC)    Bradycardia    Bronchitis    Depression    Dyspnea    GERD (gastroesophageal reflux disease)    Glaucoma    Hypertension    Presence of Watchman left atrial appendage closure device 08/07/2021   Watchman FLX 27mm with Dr. Marven Slimmer   Seasonal allergies    Sleep apnea    does not use CPAP   Wears glasses    Past Surgical History:  Procedure Laterality Date   ABDOMINAL HYSTERECTOMY  1990   ATRIAL FIBRILLATION ABLATION N/A 08/12/2020   Procedure: ATRIAL FIBRILLATION ABLATION;  Surgeon: Boyce Byes, MD;  Location: [facility];  Service: Cardiovascular;  Laterality: N/A;   ATRIAL FIBRILLATION ABLATION N/A 03/26/2023   Procedure: ATRIAL FIBRILLATION ABLATION;  Surgeon: Boyce Byes, MD;  Location: [facility];  Service: Cardiovascular;  Laterality: N/A;   BREAST BIOPSY Right    benign   CARDIOVERSION N/A 08/15/2019   Procedure: CARDIOVERSION;  Surgeon: Cody Das, MD;  Location: [facility];  Service: Cardiovascular;  Laterality: N/A;   COLONOSCOPY     LEFT ATRIAL APPENDAGE OCCLUSION N/A 08/07/2021   Procedure: LEFT ATRIAL APPENDAGE OCCLUSION;  Surgeon: Boyce Byes, MD;  Location: [facility];  Service: Cardiovascular;  Laterality: N/A;   meniscus tear knee     left knee   NASAL SINUS SURGERY  1975   ORIF ANKLE FRACTURE  2009   left   ORIF HUMERUS FRACTURE Right 07/15/2023    Procedure: OPEN REDUCTION INTERNAL FIXATION (ORIF) DISTAL HUMERUS FRACTURE;  Surgeon: Micheline Ahr, MD;  Location: [facility];  Service: Orthopedics;  Laterality: Right;   TEE WITHOUT CARDIOVERSION N/A 08/07/2021   Procedure: TRANSESOPHAGEAL ECHOCARDIOGRAM (TEE);  Surgeon: Boyce Byes, MD;  Location: [facility] INVASIVE CV [facility];  Service: Cardiovascular;  Laterality: N/A;   TOTAL KNEE ARTHROPLASTY Left 02/03/2019   Procedure: LEFT TOTAL KNEE ARTHROPLASTY;  Surgeon: Arnie Lao, MD;  Location: [facility];  Service: Orthopedics;  Laterality: Left;   TURBINATE REDUCTION Bilateral 06/04/2014   Procedure: BILATERAL TURBINATE REDUCTION;  Surgeon: Reynold Caves, MD;  Location: [facility];  Service: ENT;  Laterality: Bilateral;   TYMPANOSTOMY TUBE PLACEMENT     left   Patient Active Problem List   Diagnosis Date Noted   Other specified disorders of eustachian tube, left ear 02/06/2023   Central perforation of tympanic membrane of left ear 02/06/2023   Impacted cerumen of right ear 02/06/2023   Presence of Watchman left atrial appendage closure device 08/07/2021   Atrial fibrillation (HCC) 08/07/2021   OSA (obstructive sleep apnea) 10/29/2020   Persistent atrial fibrillation (HCC) 03/27/2020   Hypercoagulable state due to persistent atrial fibrillation (HCC) 03/27/2020   PAD (peripheral artery disease) (HCC) 12/27/2019   Exertional  chest pain 12/27/2019   Primary osteoarthritis of right knee 07/12/2019   Paroxysmal atrial fibrillation (HCC) 06/16/2019   Nonrheumatic aortic valve insufficiency 06/16/2019   Nonrheumatic mitral valve regurgitation 06/16/2019   Tachycardia 02/20/2019   Abnormal EKG 02/20/2019   Essential hypertension 02/20/2019   Status post total left knee replacement 02/03/2019   Unilateral primary osteoarthritis, left knee 11/30/2016   Chronic pain of left knee 11/30/2016    PCP: Glena Landau, MD  REFERRING PROVIDER: Nicholas Bari  PA-C  REFERRING DIAG: ORIF right supracondylar humerus fracture 07/15/23  THERAPY DIAG:  Right shoulder pain; right shoulder stiffness; weakness  Rationale for Evaluation and Treatment: Rehabilitation  ONSET DATE: early May  SUBJECTIVE:                                                                                                                                                                                      SUBJECTIVE STATEMENT: Tripped over a drop cord and hit the hard floor; fractured distal right humerus; had ORIF 5/15 (2 weeks later) ;  a little tingling in fingers coming/going;  fell (didn't pick up foot) 1 week after surgery onto right arm and wrist got checked out at the doctor's and they gave me inflammation and steroid injection for pain;  they told me to stay out of the splint unless in a lot of pain(wore for 1 week) CT head negative for brain bleed; limited in driving Hand dominance: Right  PERTINENT HISTORY: Afib with Watchman device; HTN; left TKR; I need the right knee replaced  PAIN:   Are you having pain? Yes NPRS scale: 6/10 Pain location: right elbow  Pain orientation: Right  PAIN TYPE: throbbing Pain description: constant  Aggravating factors: first thing in the morning, wiping butt, brush teeth; opening fridge door; pushing too far; sweeping, wash dishes (circular)  Relieving factors: ice,  hydrocodone, Tylenol  3-4x /day   PRECAUTIONS: post-surgical    WEIGHT BEARING RESTRICTIONS: No  FALLS:  Has patient fallen in last 6 months? Yes. Number of falls 2 both recently  LIVING ENVIRONMENT: Lives with: lives with their spouse Lives in: House/apartment  OCCUPATION: retired  PLOF: Independent with basic ADLs  PATIENT GOALS:back to water  aerobics, cooking, cleaning  NEXT MD VISIT:  July 1st or 2nd  OBJECTIVE:  Note: Objective measures were completed at Evaluation unless otherwise noted.  DIAGNOSTIC FINDINGS:  Right supracondylar humeral  fracture  PATIENT SURVEYS:  Quick Dash 84.1%  COGNITION: Overall cognitive status: Within functional limits for tasks assessed     APPEARANCE: Moderate elbow edema and bruising, incision appears well healed; bruising on both arms and face from recent falls  UPPER EXTREMITY ROM:  decreased cervical ROM  Active ROM Right eval Left eval  Shoulder flexion 133 150  Shoulder extension    Shoulder abduction 166 173  Shoulder adduction    Shoulder internal rotation L4-5 T1  Shoulder external rotation 64 62  Elbow flexion 115   Elbow extension -26 pain   Wrist flexion 47   Wrist extension 62   Wrist ulnar deviation 55 pain   Wrist radial deviation 15 pain   Wrist pronation pain   Wrist supination pain   (Blank rows = not tested)  UPPER EXTREMITY MMT:  MMT Right eval Left eval  Shoulder flexion 3 5  Shoulder extension 4 5  Shoulder abduction 3 5  Shoulder adduction    Shoulder internal rotation 3 5  Shoulder external rotation 3 5  Middle trapezius    Lower trapezius    Elbow flexion 2 pain 5  Elbow extension 2 pain 5  Wrist flexion 3 5  Wrist extension 3 5  Wrist ulnar deviation    Wrist radial deviation    Wrist pronation 2 pain 5  Wrist supination 2 pain 5  Grip strength (lbs) 10#   (Blank rows = not tested)                                                                                                                              TREATMENT DATE: 08/11/23 Evaluation Initial HEP Elevation for edema control Discussed cane in left hand for right knee pain control, balance for extended walking   PATIENT EDUCATION: Education details: Educated patient on anatomy and physiology of current symptoms, prognosis, plan of care as well as initial self care strategies to promote recovery Person educated: Patient Education method: Explanation Education comprehension: verbalized understanding  HOME EXERCISE PROGRAM: Access Code: [email or web address] URL:  [email or web address] Date: 08/11/2023 Prepared by: Darien Eden  Exercises - Supine Shoulder Flexion AAROM with Hands Clasped  - 2-3 x daily - 7 x weekly - 2 sets - 8-10 reps - Seated Upper Trapezius Stretch  - 1 x daily - 7 x weekly - 1 sets - 3 reps - 30 hold - Seated Scapular Retraction  - 1 x daily - 7 x weekly - 1 sets - 10 reps - Circular Shoulder Pendulum with Table Support  - 1 x daily - 7 x weekly - 1 sets - 10 reps - Wrist Flexion Extension AROM with Fingers Curled and Palm Down  - 1 x daily - 7 x weekly - 1 sets - 10 reps  ASSESSMENT:  CLINICAL IMPRESSION: Patient is a 73 y.o. female who was seen today for physical therapy evaluation and treatment s/p ORIF right supracondylar distal humerus fracture on 07/15/23 following a fall 2 weeks prior.  She also had a fall a week after surgery onto her right arm.  She was given a steroid injection and something for inflammation at that time.  The patient would benefit from skilled PT to address ROM deficits of right  shoulder, elbow, wrist and hand.  All movements are painful and with numbness and tingling in the fingers.  Moderate elbow edema is present as well as bruising.  The patient is severely limited with function (she is right hand dominant) with impairments with personal hygiene, cooking and cleaning tasks.   OBJECTIVE IMPAIRMENTS: decreased activity tolerance, decreased ROM, decreased strength, impaired perceived functional ability, impaired UE functional use, and pain.   ACTIVITY LIMITATIONS: carrying, lifting, sleeping, dressing, reach over head, and hygiene/grooming  PARTICIPATION LIMITATIONS: meal prep, cleaning, laundry, driving, shopping, and community activity  PERSONAL FACTORS: 1-2 comorbidities: recent falls, knee OA, HTN are also affecting patient's functional outcome.   REHAB POTENTIAL: Good  CLINICAL DECISION MAKING: Stable/uncomplicated  EVALUATION COMPLEXITY: Low   GOALS: Goals reviewed with  patient? Yes  SHORT TERM GOALS: Target date: 09/08/2023   The patient will demonstrate knowledge of basic self care strategies and exercises to promote healing  Baseline: Goal status: INITIAL  2.  The patient will have improved shoulder elevation ROM to at least 150 degrees needed for reaching high shelves  Baseline:  Goal status: INITIAL  3.  The patient will have improved right elbow flexion to 125 degrees needed for grooming tasks  Baseline:  Goal status: INITIAL  4.  Improved elbow extension to 16 degrees needed for cooking and cleaning Baseline:  Goal status: INITIAL  5.  Right grip strength improved to 15# needed for carrying objects Baseline:  Goal status: INITIAL   LONG TERM GOALS: Target date: 10/06/2023   The patient will be independent in a safe self progression of a home exercise program to promote further recovery of function  Baseline:  Goal status: INITIAL  2.  The patient will have improved elbow ROM 10- 130 degrees needed for grooming/dressing purposes as well as cooking and cleaning  Baseline:  Goal status: INITIAL  3.  The patient will have grossly 4/5 strength needed to lift and lower a 3# object from a high shelf  Baseline:  Goal status: INITIAL  4.  Grip strength improved to 20# needed for carrying shopping bags Baseline:  Goal status: INITIAL  5.  Quick DASH score improved to   64%    indicating improved UE function with less pain Baseline:  Goal status: INITIAL   PLAN:  PT FREQUENCY: 2x/week  PT DURATION: 8 weeks  PLANNED INTERVENTIONS: 97164- PT Re-evaluation, 97110-Therapeutic exercises, 97530- Therapeutic activity, 97112- Neuromuscular re-education, 97535- Self Care, 96045- Manual therapy, [phone]- Aquatic Therapy, 97016- Vasopneumatic device, [id number]- Ultrasound, 19147- Ionotophoresis 4mg /ml Dexamethasone , Patient/Family education, Taping, Joint mobilization, Cryotherapy, and Moist heat  PLAN FOR NEXT SESSION: gentle passive and active  assisted elbow ROM, shoulder pendulums, finger and wrist ROM;  vasocompression for edema control; aquatic PT     ORIF right supracondylar distal humerus fracture 07/15/23   Darien Eden, PT 08/11/23 6:28 PM Phone: [phone] Fax: [phone]

## 2023-08-12 ENCOUNTER — Ambulatory Visit: Admitting: Rehabilitative and Restorative Service Providers"

## 2023-08-12 ENCOUNTER — Encounter: Payer: Self-pay | Admitting: Rehabilitative and Restorative Service Providers"

## 2023-08-12 DIAGNOSIS — M25621 Stiffness of right elbow, not elsewhere classified: Secondary | ICD-10-CM | POA: Diagnosis not present

## 2023-08-12 DIAGNOSIS — M25611 Stiffness of right shoulder, not elsewhere classified: Secondary | ICD-10-CM

## 2023-08-12 DIAGNOSIS — M6281 Muscle weakness (generalized): Secondary | ICD-10-CM | POA: Diagnosis not present

## 2023-08-12 DIAGNOSIS — M25521 Pain in right elbow: Secondary | ICD-10-CM | POA: Diagnosis not present

## 2023-08-12 DIAGNOSIS — S39013D Strain of muscle, fascia and tendon of pelvis, subsequent encounter: Secondary | ICD-10-CM | POA: Diagnosis not present

## 2023-08-12 DIAGNOSIS — S42411D Displaced simple supracondylar fracture without intercondylar fracture of right humerus, subsequent encounter for fracture with routine healing: Secondary | ICD-10-CM | POA: Diagnosis not present

## 2023-08-12 DIAGNOSIS — R2689 Other abnormalities of gait and mobility: Secondary | ICD-10-CM | POA: Diagnosis not present

## 2023-08-12 DIAGNOSIS — R600 Localized edema: Secondary | ICD-10-CM | POA: Diagnosis not present

## 2023-08-12 DIAGNOSIS — M25552 Pain in left hip: Secondary | ICD-10-CM | POA: Diagnosis not present

## 2023-08-12 NOTE — Therapy (Signed)
 OUTPATIENT PHYSICAL THERAPY SHOULDER EVALUATION   Patient Name: Michelle Johnston MRN: [id number] DOB:December 29, 1950, 73 y.o., female Today's Date: 08/12/2023  END OF SESSION:  PT End of Session - 08/12/23 0805     Visit Number 2    Date for PT Re-Evaluation 10/06/23    Authorization Type [employer] will submit    PT Start Time 0802    PT Stop Time 0852    PT Time Calculation (min) 50 min    Activity Tolerance Patient tolerated treatment well    Behavior During Therapy [facility] for tasks assessed/performed          Past Medical History:  Diagnosis Date   Anxiety    Arthritis    Atrial fibrillation (HCC)    Bradycardia    Bronchitis    Depression    Dyspnea    GERD (gastroesophageal reflux disease)    Glaucoma    Hypertension    Presence of Watchman left atrial appendage closure device 08/07/2021   Watchman FLX 27mm with Dr. Marven Slimmer   Seasonal allergies    Sleep apnea    does not use CPAP   Wears glasses    Past Surgical History:  Procedure Laterality Date   ABDOMINAL HYSTERECTOMY  1990   ATRIAL FIBRILLATION ABLATION N/A 08/12/2020   Procedure: ATRIAL FIBRILLATION ABLATION;  Surgeon: Boyce Byes, MD;  Location: [facility];  Service: Cardiovascular;  Laterality: N/A;   ATRIAL FIBRILLATION ABLATION N/A 03/26/2023   Procedure: ATRIAL FIBRILLATION ABLATION;  Surgeon: Boyce Byes, MD;  Location: [facility];  Service: Cardiovascular;  Laterality: N/A;   BREAST BIOPSY Right    benign   CARDIOVERSION N/A 08/15/2019   Procedure: CARDIOVERSION;  Surgeon: Cody Das, MD;  Location: [facility];  Service: Cardiovascular;  Laterality: N/A;   COLONOSCOPY     LEFT ATRIAL APPENDAGE OCCLUSION N/A 08/07/2021   Procedure: LEFT ATRIAL APPENDAGE OCCLUSION;  Surgeon: Boyce Byes, MD;  Location: [facility];  Service: Cardiovascular;  Laterality: N/A;   meniscus tear knee     left knee   NASAL SINUS SURGERY  1975   ORIF ANKLE FRACTURE  2009    left   ORIF HUMERUS FRACTURE Right 07/15/2023   Procedure: OPEN REDUCTION INTERNAL FIXATION (ORIF) DISTAL HUMERUS FRACTURE;  Surgeon: Micheline Ahr, MD;  Location: [facility];  Service: Orthopedics;  Laterality: Right;   TEE WITHOUT CARDIOVERSION N/A 08/07/2021   Procedure: TRANSESOPHAGEAL ECHOCARDIOGRAM (TEE);  Surgeon: Boyce Byes, MD;  Location: [facility] INV[facility]IVE CV LAB;  Service: Cardiovascular;  Laterality: N/A;   TOTAL KNEE ARTHROPLASTY Left 02/03/2019   Procedure: LEFT TOTAL KNEE ARTHROPLASTY;  Surgeon: Arnie Lao, MD;  Location: [facility];  Service: Orthopedics;  Laterality: Left;   TURBINATE REDUCTION Bilateral 06/04/2014   Procedure: BILATERAL TURBINATE REDUCTION;  Surgeon: Reynold Caves, MD;  Location: [facility];  Service: ENT;  Laterality: Bilateral;   TYMPANOSTOMY TUBE PLACEMENT     left   Patient Active Problem List   Diagnosis Date Noted   Other specified disorders of eustachian tube, left ear 02/06/2023   Central perforation of tympanic membrane of left ear 02/06/2023   Impacted cerumen of right ear 02/06/2023   Presence of Watchman left atrial appendage closure device 08/07/2021   Atrial fibrillation (HCC) 08/07/2021   OSA (obstructive sleep apnea) 10/29/2020   Persistent atrial fibrillation (HCC) 03/27/2020   Hypercoagulable state due to persistent atrial fibrillation (HCC) 03/27/2020   PAD (peripheral  artery disease) (HCC) 12/27/2019   Exertional chest pain 12/27/2019   Primary osteoarthritis of right knee 07/12/2019   Paroxysmal atrial fibrillation (HCC) 06/16/2019   Nonrheumatic aortic valve insufficiency 06/16/2019   Nonrheumatic mitral valve regurgitation 06/16/2019   Tachycardia 02/20/2019   Abnormal EKG 02/20/2019   Essential hypertension 02/20/2019   Status post total left knee replacement 02/03/2019   Unilateral primary osteoarthritis, left knee 11/30/2016   Chronic pain of left knee 11/30/2016    PCP: Glena Landau,  MD  REFERRING PROVIDER: Nicholas Bari PA-C  REFERRING DIAG: ORIF right supracondylar humerus fracture 07/15/23  THERAPY DIAG:  Right shoulder pain; right shoulder stiffness; weakness  Rationale for Evaluation and Treatment: Rehabilitation  ONSET DATE: early May  SUBJECTIVE:                                                                                                                                                                                      SUBJECTIVE STATEMENT: Patient states that she did some of her exercises last night.   Hand dominance: Right  PERTINENT HISTORY: Right distal humerus Fx with s/p ORIF on 07/15/23 Afib with Watchman device; OA; HTN; left TKR; I need the right knee replaced  PAIN:   Are you having pain? Yes NPRS scale: 5/10 Pain location: right elbow  Pain orientation: Right  PAIN TYPE: throbbing Pain description: constant  Aggravating factors: first thing in the morning, wiping butt, brush teeth; opening fridge door; pushing too far; sweeping, wash dishes (circular)  Relieving factors: ice,  hydrocodone, Tylenol  3-4x /day   PRECAUTIONS: Fall precautions, 10# lifting restriction due to recent humerus ORIF on 07/15/23   WEIGHT BEARING RESTRICTIONS: No  FALLS:  Has patient fallen in last 6 months? Yes. Number of falls 2 both recently  LIVING ENVIRONMENT: Lives with: lives with their spouse Lives in: House/apartment  OCCUPATION: retired  PLOF: Independent with basic ADLs  PATIENT GOALS:back to water  aerobics, cooking, cleaning  NEXT MD VISIT:  July 1st or 2nd  OBJECTIVE:  Note: Objective measures were completed at Evaluation unless otherwise noted.  DIAGNOSTIC FINDINGS:  Right supracondylar humeral fracture  PATIENT SURVEYS:  Eval:  Quick Dash 84.1%  COGNITION: Overall cognitive status: Within functional limits for tasks assessed     APPEARANCE: Moderate elbow edema and bruising, incision appears well healed; bruising on  both arms and face from recent falls  UPPER EXTREMITY ROM:  decreased cervical ROM  Active ROM Right eval Left eval  Shoulder flexion 133 150  Shoulder extension    Shoulder abduction 166 173  Shoulder adduction    Shoulder internal rotation L4-5 T1  Shoulder external rotation 64 62  Elbow flexion  115   Elbow extension -26 pain   Wrist flexion 47   Wrist extension 62   Wrist ulnar deviation 55 pain   Wrist radial deviation 15 pain   Wrist pronation pain   Wrist supination pain   (Blank rows = not tested)  UPPER EXTREMITY MMT:  MMT Right eval Left eval  Shoulder flexion 3 5  Shoulder extension 4 5  Shoulder abduction 3 5  Shoulder adduction    Shoulder internal rotation 3 5  Shoulder external rotation 3 5  Middle trapezius    Lower trapezius    Elbow flexion 2 pain 5  Elbow extension 2 pain 5  Wrist flexion 3 5  Wrist extension 3 5  Wrist ulnar deviation    Wrist radial deviation    Wrist pronation 2 pain 5  Wrist supination 2 pain 5  Grip strength (lbs) 10#   (Blank rows = not tested)                                                                                                                              TODAY'S TREATMENT DATE: 08/12/2023 Shoulder pulleys for flexion and abduction x2 min each Seated right wrist flexion/extension x15 Seated right wrist ulnar and radial deviation x15 each Seated right supination/pronation x15 Seated scapular retraction 2x10 Standing right shoulder UE ranger for flexion/extension on first, second, and third step 2x10 each Wall finger ladder for flexion x10 Right shoulder pendulums x10 (cuing for technique) Standing right shoulder scaption wall wash 2x10 Supine shoulder flexion with dowel rod 2x10 Supine bicep curl with yoga block under elbow to encourage elbow extension with 1# dumbbell x10 Education on biceps extension stretch at home Cold pack to right shoulder x10 min   DATE: 08/11/23 Evaluation Initial  HEP Elevation for edema control Discussed cane in left hand for right knee pain control, balance for extended walking   PATIENT EDUCATION: Education details: Educated patient on anatomy and physiology of current symptoms, prognosis, plan of care as well as initial self care strategies to promote recovery Person educated: Patient Education method: Explanation Education comprehension: verbalized understanding  HOME EXERCISE PROGRAM: Access Code: [email or web address] URL: [email or web address] Date: 08/11/2023 Prepared by: Darien Eden  Exercises - Supine Shoulder Flexion AAROM with Hands Clasped  - 2-3 x daily - 7 x weekly - 2 sets - 8-10 reps - Seated Upper Trapezius Stretch  - 1 x daily - 7 x weekly - 1 sets - 3 reps - 30 hold - Seated Scapular Retraction  - 1 x daily - 7 x weekly - 1 sets - 10 reps - Circular Shoulder Pendulum with Table Support  - 1 x daily - 7 x weekly - 1 sets - 10 reps - Wrist Flexion Extension AROM with Fingers Curled and Palm Down  - 1 x daily - 7 x weekly - 1 sets - 10 reps  Added right elbow extension passive stretch on 08/12/23  ASSESSMENT:  CLINICAL IMPRESSION: Ms  Finau presents to skilled PT for fist visit following initial evaluation yesterday.  Patient does report that she did her exercises last night.  States that she having some pain this morning.  Patient able to progress with various AA/ROM tasks for her right shoulder and incorporate wrist and elbow ROM, as well.  Patient with some pain with right biceps curls secondary to decreased elbow extension.  Patient educated on when she is resting at home, to occasionally place her right elbow at a place where it can perform natural elbow extension stretch.  Patient verbalizes understanding.  Patient continues to require skilled PT to progress towards goal related activities.    OBJECTIVE IMPAIRMENTS: decreased activity tolerance, decreased ROM, decreased strength, impaired perceived functional ability,  impaired UE functional use, and pain.   ACTIVITY LIMITATIONS: carrying, lifting, sleeping, dressing, reach over head, and hygiene/grooming  PARTICIPATION LIMITATIONS: meal prep, cleaning, laundry, driving, shopping, and community activity  PERSONAL FACTORS: 1-2 comorbidities: recent falls, knee OA, HTN are also affecting patient's functional outcome.   REHAB POTENTIAL: Good  CLINICAL DECISION MAKING: Stable/uncomplicated  EVALUATION COMPLEXITY: Low   GOALS: Goals reviewed with patient? Yes  SHORT TERM GOALS: Target date: 09/08/2023   The patient will demonstrate knowledge of basic self care strategies and exercises to promote healing  Baseline: Goal status: Ongoing  2.  The patient will have improved shoulder elevation ROM to at least 150 degrees needed for reaching high shelves  Baseline:  Goal status: Ongoing  3.  The patient will have improved right elbow flexion to 125 degrees needed for grooming tasks  Baseline:  Goal status: INITIAL  4.  Improved elbow extension to 16 degrees needed for cooking and cleaning Baseline:  Goal status: INITIAL  5.  Right grip strength improved to 15# needed for carrying objects Baseline:  Goal status: INITIAL   LONG TERM GOALS: Target date: 10/06/2023   The patient will be independent in a safe self progression of a home exercise program to promote further recovery of function  Baseline:  Goal status: INITIAL  2.  The patient will have improved elbow ROM 10- 130 degrees needed for grooming/dressing purposes as well as cooking and cleaning  Baseline:  Goal status: INITIAL  3.  The patient will have grossly 4/5 strength needed to lift and lower a 3# object from a high shelf  Baseline:  Goal status: INITIAL  4.  Grip strength improved to 20# needed for carrying shopping bags Baseline:  Goal status: INITIAL  5.  Quick DASH score improved to   64%    indicating improved UE function with less pain Baseline:  Goal status:  INITIAL   PLAN:  PT FREQUENCY: 2x/week  PT DURATION: 8 weeks  PLANNED INTERVENTIONS: 40981- PT Re-evaluation, 97110-Therapeutic exercises, 97530- Therapeutic activity, 97112- Neuromuscular re-education, 97535- Self Care, 19147- Manual therapy, [phone]- Aquatic Therapy, 97016- Vasopneumatic device, [id number]- Ultrasound, [id number]- Ionotophoresis 4mg /ml Dexamethasone , Patient/Family education, Taping, Joint mobilization, Cryotherapy, and Moist heat  PLAN FOR NEXT SESSION: gentle passive and active assisted elbow ROM, shoulder pendulums, finger and wrist ROM;  vasocompression for edema control; aquatic PT     ORIF right supracondylar distal humerus fracture 07/15/23     Robyne Christen, PT, DPT 08/12/23, 9:05 AM  [facility] [address] [place], [place] [address] Phone # [phone] Fax [phone]

## 2023-08-13 ENCOUNTER — Ambulatory Visit (INDEPENDENT_AMBULATORY_CARE_PROVIDER_SITE_OTHER): Payer: Medicare HMO | Admitting: Otolaryngology

## 2023-08-13 VITALS — BP 122/71 | HR 60 | Ht 65.0 in | Wt 150.0 lb

## 2023-08-13 DIAGNOSIS — H7202 Central perforation of tympanic membrane, left ear: Secondary | ICD-10-CM

## 2023-08-13 DIAGNOSIS — Z9629 Presence of other otological and audiological implants: Secondary | ICD-10-CM

## 2023-08-13 DIAGNOSIS — H6982 Other specified disorders of Eustachian tube, left ear: Secondary | ICD-10-CM

## 2023-08-13 DIAGNOSIS — H6122 Impacted cerumen, left ear: Secondary | ICD-10-CM | POA: Diagnosis not present

## 2023-08-13 DIAGNOSIS — Z09 Encounter for follow-up examination after completed treatment for conditions other than malignant neoplasm: Secondary | ICD-10-CM | POA: Diagnosis not present

## 2023-08-13 NOTE — Progress Notes (Unsigned)
 Patient ID: Michelle Johnston, female   DOB: October 05, 1950, 73 y.o.   MRN: [id number]  Follow-up: Left ear eustachian tube dysfunction, left ventilating tube, hearing loss   HPI: The patient is a 73 year old female who returns today for follow-up evaluation.  The patient has a history of left ear eustachian tube dysfunction, requiring left myringotomy and tube placement in 2023.  At her last visit in December 2024, the left T-tube was in place and patent.  The patient was instructed to observe dry ear precautions on the left side.  According to the patient, she has been doing well over the past 6 months.  She has not noted any otalgia, otorrhea, change in her hearing, or vertigo.  Exam: General: Communicates without difficulty, well nourished, no acute distress. Head: Normocephalic, no evidence injury, no tenderness, facial buttresses intact without stepoff. Face/sinus: No tenderness to palpation and percussion. Facial movement is normal and symmetric. Eyes: PERRL, EOMI. No scleral icterus, conjunctivae clear. Neuro: CN II exam reveals vision grossly intact.  No nystagmus at any point of gaze. Ears: Auricles well formed without lesions.  Left ear ear cerumen impaction.  The right tympanic membrane and middle ear space are normal.  Nose: External evaluation reveals normal support and skin without lesions.  Dorsum is intact.  Anterior rhinoscopy reveals congested mucosa over anterior aspect of inferior turbinates and intact septum.  No purulence noted. Oral:  Oral cavity and oropharynx are intact, symmetric, without erythema or edema.  Mucosa is moist without lesions. Neck: Full range of motion without pain.  There is no significant lymphadenopathy.  No masses palpable.  Thyroid bed within normal limits to palpation.  Parotid glands and submandibular glands equal bilaterally without mass.  Trachea is midline. Neuro:  CN 2-12 grossly intact.    Procedure: Left ear ear cerumen disimpaction Anesthesia: None Description:  Under the operating microscope, the cerumen is carefully removed with a combination of cerumen currette, alligator forceps, and suction catheters.  After the cerumen is removed, the left T-tube is in place and patent.  No mass, erythema, or lesions. The patient tolerated the procedure well.     Assessment: 1.  Left ear ear cerumen impaction.  2.  The left T-tube is in place and patent. 3.  Left ear eustachian tube dysfunction. 4.  The right ear canal and tympanic membrane are normal.  Plan: 1.  Otomicroscopy with left ear cerumen disimpaction. 2.  The physical exam findings are reviewed with the patient. 3.  Continue with dry ear precautions on the left side. 4.  Valsalva exercise multiple times a day. 5.  The patient will return for reevaluation in 6 months.  We will obtain a repeat hearing test at that time.

## 2023-08-15 DIAGNOSIS — H6122 Impacted cerumen, left ear: Secondary | ICD-10-CM | POA: Insufficient documentation

## 2023-08-17 DIAGNOSIS — M25511 Pain in right shoulder: Secondary | ICD-10-CM | POA: Diagnosis not present

## 2023-08-18 DIAGNOSIS — M545 Low back pain, unspecified: Secondary | ICD-10-CM | POA: Diagnosis not present

## 2023-08-19 ENCOUNTER — Ambulatory Visit (HOSPITAL_COMMUNITY)
Admission: RE | Admit: 2023-08-19 | Discharge: 2023-08-19 | Disposition: A | Discharge status: Home / Self Care | Source: Ambulatory Visit | Attending: Cardiology | Admitting: Cardiology

## 2023-08-19 ENCOUNTER — Ambulatory Visit: Payer: Self-pay | Admitting: Cardiology

## 2023-08-19 ENCOUNTER — Other Ambulatory Visit (HOSPITAL_COMMUNITY): Payer: Self-pay | Admitting: Cardiology

## 2023-08-19 DIAGNOSIS — Z87891 Personal history of nicotine dependence: Secondary | ICD-10-CM | POA: Insufficient documentation

## 2023-08-19 DIAGNOSIS — I361 Nonrheumatic tricuspid (valve) insufficiency: Secondary | ICD-10-CM

## 2023-08-19 DIAGNOSIS — I77819 Aortic ectasia, unspecified site: Secondary | ICD-10-CM | POA: Diagnosis not present

## 2023-08-19 DIAGNOSIS — I10 Essential (primary) hypertension: Secondary | ICD-10-CM | POA: Diagnosis not present

## 2023-08-19 DIAGNOSIS — I4891 Unspecified atrial fibrillation: Secondary | ICD-10-CM | POA: Insufficient documentation

## 2023-08-19 DIAGNOSIS — I083 Combined rheumatic disorders of mitral, aortic and tricuspid valves: Secondary | ICD-10-CM | POA: Diagnosis not present

## 2023-08-19 DIAGNOSIS — Z8249 Family history of ischemic heart disease and other diseases of the circulatory system: Secondary | ICD-10-CM | POA: Diagnosis not present

## 2023-08-19 DIAGNOSIS — I77810 Thoracic aortic ectasia: Secondary | ICD-10-CM | POA: Diagnosis not present

## 2023-08-19 LAB — ECHOCARDIOGRAM COMPLETE
Area-P 1/2: 3.74 cm2
MV M vel: 5.69 m/s
MV Peak grad: 129.3 mmHg
P 1/2 time: 490 ms
Radius: 0.53 cm
S' Lateral: 2.6 cm

## 2023-08-19 NOTE — Therapy (Signed)
 OUTPATIENT PHYSICAL THERAPY SHOULDER EVALUATION   Patient Name: Michelle Johnston MRN: [id number] DOB:[birth year], 73 y.o., female Today's Date: 08/19/2023  END OF SESSION:    Past Medical History:  Diagnosis Date   Anxiety    Arthritis    Atrial fibrillation (HCC)    Bradycardia    Bronchitis    Depression    Dyspnea    GERD (gastroesophageal reflux disease)    Glaucoma    Hypertension    Presence of Watchman left atrial appendage closure device 08/07/2021   Watchman FLX 27mm with Dr. Marven Slimmer   Seasonal allergies    Sleep apnea    does not use CPAP   Wears glasses    Past Surgical History:  Procedure Laterality Date   ABDOMINAL HYSTERECTOMY  1990   ATRIAL FIBRILLATION ABLATION N/A 08/12/2020   Procedure: ATRIAL FIBRILLATION ABLATION;  Surgeon: Boyce Byes, MD;  Location: [facility];  Service: Cardiovascular;  Laterality: N/A;   ATRIAL FIBRILLATION ABLATION N/A 03/26/2023   Procedure: ATRIAL FIBRILLATION ABLATION;  Surgeon: Boyce Byes, MD;  Location: [facility];  Service: Cardiovascular;  Laterality: N/A;   BREAST BIOPSY Right    benign   CARDIOVERSION N/A 08/15/2019   Procedure: CARDIOVERSION;  Surgeon: Cody Das, MD;  Location: [facility];  Service: Cardiovascular;  Laterality: N/A;   COLONOSCOPY     LEFT ATRIAL APPENDAGE OCCLUSION N/A 08/07/2021   Procedure: LEFT ATRIAL APPENDAGE OCCLUSION;  Surgeon: Boyce Byes, MD;  Location: [facility];  Service: Cardiovascular;  Laterality: N/A;   meniscus tear knee     left knee   NASAL SINUS SURGERY  1975   ORIF ANKLE FRACTURE  2009   left   ORIF HUMERUS FRACTURE Right 07/15/2023   Procedure: OPEN REDUCTION INTERNAL FIXATION (ORIF) DISTAL HUMERUS FRACTURE;  Surgeon: Micheline Ahr, MD;  Location: [facility];  Service: Orthopedics;  Laterality: Right;   TEE WITHOUT CARDIOVERSION N/A 08/07/2021   Procedure: TRANSESOPHAGEAL ECHOCARDIOGRAM (TEE);  Surgeon: Boyce Byes, MD;  Location: [facility];  Service: Cardiovascular;  Laterality: N/A;   TOTAL KNEE ARTHROPLASTY Left 02/03/2019   Procedure: LEFT TOTAL KNEE ARTHROPLASTY;  Surgeon: Arnie Lao, MD;  Location: [facility];  Service: Orthopedics;  Laterality: Left;   TURBINATE REDUCTION Bilateral 06/04/2014   Procedure: BILATERAL TURBINATE REDUCTION;  Surgeon: Reynold Caves, MD;  Location:  SURGERY CENTER;  Service: ENT;  Laterality: Bilateral;   TYMPANOSTOMY TUBE PLACEMENT     left   Patient Active Problem List   Diagnosis Date Noted   Impacted cerumen of left ear 08/15/2023   Other specified disorders of eustachian tube, left ear 02/06/2023   Central perforation of tympanic membrane of left ear 02/06/2023   Presence of Watchman left atrial appendage closure device 08/07/2021   Atrial fibrillation (HCC) 08/07/2021   OSA (obstructive sleep apnea) 10/29/2020   Persistent atrial fibrillation (HCC) 03/27/2020   Hypercoagulable state due to persistent atrial fibrillation (HCC) 03/27/2020   PAD (peripheral artery disease) (HCC) 12/27/2019   Exertional chest pain 12/27/2019   Primary osteoarthritis of right knee 07/12/2019   Paroxysmal atrial fibrillation (HCC) 06/16/2019   Nonrheumatic aortic valve insufficiency 06/16/2019   Nonrheumatic mitral valve regurgitation 06/16/2019   Tachycardia 02/20/2019   Abnormal EKG 02/20/2019   Essential hypertension 02/20/2019   Status post total left knee replacement 02/03/2019   Unilateral primary osteoarthritis, left knee 11/30/2016   Chronic pain of left knee 11/30/2016  PCP: Glena Landau, MD  REFERRING PROVIDER: Nicholas Bari PA-C  REFERRING DIAG: ORIF right supracondylar humerus fracture 07/15/23  THERAPY DIAG:  Right shoulder pain; right shoulder stiffness; weakness  Rationale for Evaluation and Treatment: Rehabilitation  ONSET DATE: early May  SUBJECTIVE:                                                                                                                                                                                       SUBJECTIVE STATEMENT: ***   Hand dominance: Right  PERTINENT HISTORY: Right distal humerus Fx with s/p ORIF on 07/15/23 Afib with Watchman device; OA; HTN; left TKR; I need the right knee replaced  PAIN:   Are you having pain? Yes NPRS scale: 5/10 Pain location: right elbow  Pain orientation: Right  PAIN TYPE: throbbing Pain description: constant  Aggravating factors: first thing in the morning, wiping butt, brush teeth; opening fridge door; pushing too far; sweeping, wash dishes (circular)  Relieving factors: ice,  hydrocodone, Tylenol  3-4x /day   PRECAUTIONS: Fall precautions, 10# lifting restriction due to recent humerus ORIF on 07/15/23   WEIGHT BEARING RESTRICTIONS: No  FALLS:  Has patient fallen in last 6 months? Yes. Number of falls 2 both recently  LIVING ENVIRONMENT: Lives with: lives with their spouse Lives in: House/apartment  OCCUPATION: retired  PLOF: Independent with basic ADLs  PATIENT GOALS:back to water  aerobics, cooking, cleaning  NEXT MD VISIT:  July 1st or 2nd  OBJECTIVE:  Note: Objective measures were completed at Evaluation unless otherwise noted.  DIAGNOSTIC FINDINGS:  Right supracondylar humeral fracture  PATIENT SURVEYS:  Eval:  Quick Dash 84.1%  COGNITION: Overall cognitive status: Within functional limits for tasks assessed     APPEARANCE: Moderate elbow edema and bruising, incision appears well healed; bruising on both arms and face from recent falls  UPPER EXTREMITY ROM:  decreased cervical ROM  Active ROM Right eval Left eval  Shoulder flexion 133 150  Shoulder extension    Shoulder abduction 166 173  Shoulder adduction    Shoulder internal rotation L4-5 T1  Shoulder external rotation 64 62  Elbow flexion 115   Elbow extension -26 pain   Wrist flexion 47   Wrist extension 62   Wrist ulnar deviation 55  pain   Wrist radial deviation 15 pain   Wrist pronation pain   Wrist supination pain   (Blank rows = not tested)  UPPER EXTREMITY MMT:  MMT Right eval Left eval  Shoulder flexion 3 5  Shoulder extension 4 5  Shoulder abduction 3 5  Shoulder adduction    Shoulder internal rotation 3 5  Shoulder external rotation 3 5  Middle trapezius    Lower trapezius    Elbow flexion 2 pain 5  Elbow extension 2 pain 5  Wrist flexion 3 5  Wrist extension 3 5  Wrist ulnar deviation    Wrist radial deviation    Wrist pronation 2 pain 5  Wrist supination 2 pain 5  Grip strength (lbs) 10#   (Blank rows = not tested)                                                                                                                              TODAY'S TREATMENT DATE: 08/20/2023 Shoulder pulleys for flexion and abduction x2 min each Seated right wrist flexion/extension x15 Seated right wrist ulnar and radial deviation x15 each Seated right supination/pronation x15 Seated scapular retraction 2x10 Standing right shoulder UE ranger for flexion/extension on first, second, and third step 2x10 each Wall finger ladder for flexion x10 Right shoulder pendulums x10 (cuing for technique) Standing right shoulder scaption wall wash 2x10 Supine shoulder flexion with dowel rod 2x10 Supine bicep curl with yoga block under elbow to encourage elbow extension with 1# dumbbell x10 Education on biceps extension stretch at home Cold pack to right shoulder x10 min  DATE: 08/12/2023 Shoulder pulleys for flexion and abduction x2 min each Seated right wrist flexion/extension x15 Seated right wrist ulnar and radial deviation x15 each Seated right supination/pronation x15 Seated scapular retraction 2x10 Standing right shoulder UE ranger for flexion/extension on first, second, and third step 2x10 each Wall finger ladder for flexion x10 Right shoulder pendulums x10 (cuing for technique) Standing right shoulder  scaption wall wash 2x10 Supine shoulder flexion with dowel rod 2x10 Supine bicep curl with yoga block under elbow to encourage elbow extension with 1# dumbbell x10 Education on biceps extension stretch at home Cold pack to right shoulder x10 min   DATE: 08/11/23 Evaluation Initial HEP Elevation for edema control Discussed cane in left hand for right knee pain control, balance for extended walking   PATIENT EDUCATION: Education details: Educated patient on anatomy and physiology of current symptoms, prognosis, plan of care as well as initial self care strategies to promote recovery Person educated: Patient Education method: Explanation Education comprehension: verbalized understanding  HOME EXERCISE PROGRAM: Access Code: [email or web address] URL: [email or web address] Date: 08/11/2023 Prepared by: Darien Eden  Exercises - Supine Shoulder Flexion AAROM with Hands Clasped  - 2-3 x daily - 7 x weekly - 2 sets - 8-10 reps - Seated Upper Trapezius Stretch  - 1 x daily - 7 x weekly - 1 sets - 3 reps - 30 hold - Seated Scapular Retraction  - 1 x daily - 7 x weekly - 1 sets - 10 reps - Circular Shoulder Pendulum with Table Support  - 1 x daily - 7 x weekly - 1 sets - 10 reps - Wrist Flexion Extension AROM with Fingers Curled and Palm Down  - 1 x daily - 7 x weekly - 1 sets - 10 reps  Added right elbow extension passive stretch on 08/12/23  ASSESSMENT:  CLINICAL IMPRESSION: ***    OBJECTIVE IMPAIRMENTS: decreased activity tolerance, decreased ROM, decreased strength, impaired perceived functional ability, impaired UE functional use, and pain.   ACTIVITY LIMITATIONS: carrying, lifting, sleeping, dressing, reach over head, and hygiene/grooming  PARTICIPATION LIMITATIONS: meal prep, cleaning, laundry, driving, shopping, and community activity  PERSONAL FACTORS: 1-2 comorbidities: recent falls, knee OA, HTN are also affecting patient's functional outcome.   REHAB POTENTIAL:  Good  CLINICAL DECISION MAKING: Stable/uncomplicated  EVALUATION COMPLEXITY: Low   GOALS: Goals reviewed with patient? Yes  SHORT TERM GOALS: Target date: 09/08/2023   The patient will demonstrate knowledge of basic self care strategies and exercises to promote healing  Baseline: Goal status: Ongoing  2.  The patient will have improved shoulder elevation ROM to at least 150 degrees needed for reaching high shelves  Baseline:  Goal status: Ongoing  3.  The patient will have improved right elbow flexion to 125 degrees needed for grooming tasks  Baseline:  Goal status: INITIAL  4.  Improved elbow extension to 16 degrees needed for cooking and cleaning Baseline:  Goal status: INITIAL  5.  Right grip strength improved to 15# needed for carrying objects Baseline:  Goal status: INITIAL   LONG TERM GOALS: Target date: 10/06/2023   The patient will be independent in a safe self progression of a home exercise program to promote further recovery of function  Baseline:  Goal status: INITIAL  2.  The patient will have improved elbow ROM 10- 130 degrees needed for grooming/dressing purposes as well as cooking and cleaning  Baseline:  Goal status: INITIAL  3.  The patient will have grossly 4/5 strength needed to lift and lower a 3# object from a high shelf  Baseline:  Goal status: INITIAL  4.  Grip strength improved to 20# needed for carrying shopping bags Baseline:  Goal status: INITIAL  5.  Quick DASH score improved to   64%    indicating improved UE function with less pain Baseline:  Goal status: INITIAL   PLAN:  PT FREQUENCY: 2x/week  PT DURATION: 8 weeks  PLANNED INTERVENTIONS: 97164- PT Re-evaluation, 97110-Therapeutic exercises, 97530- Therapeutic activity, 97112- Neuromuscular re-education, 97535- Self Care, 78295- Manual therapy, [id number]- Aquatic Therapy, 97016- Vasopneumatic device, [id number]- Ultrasound, 86578- Ionotophoresis 4mg /ml Dexamethasone , Patient/Family  education, Taping, Joint mobilization, Cryotherapy, and Moist heat  PLAN FOR NEXT SESSION: gentle passive and active assisted elbow ROM, shoulder pendulums, finger and wrist ROM;  vasocompression for edema control; aquatic PT     ORIF right supracondylar distal humerus fracture 07/15/23     *** 08/19/23, 8:53 PM  [facility] [address], [address] [place], [place] [address] Phone # [phone] Fax [phone]

## 2023-08-20 ENCOUNTER — Encounter: Payer: Self-pay | Admitting: Physical Therapy

## 2023-08-20 ENCOUNTER — Ambulatory Visit: Admitting: Physical Therapy

## 2023-08-20 ENCOUNTER — Other Ambulatory Visit: Payer: Self-pay | Admitting: *Deleted

## 2023-08-20 DIAGNOSIS — M25552 Pain in left hip: Secondary | ICD-10-CM

## 2023-08-20 DIAGNOSIS — M25621 Stiffness of right elbow, not elsewhere classified: Secondary | ICD-10-CM

## 2023-08-20 DIAGNOSIS — M25521 Pain in right elbow: Secondary | ICD-10-CM

## 2023-08-20 DIAGNOSIS — M25611 Stiffness of right shoulder, not elsewhere classified: Secondary | ICD-10-CM

## 2023-08-20 DIAGNOSIS — S42411D Displaced simple supracondylar fracture without intercondylar fracture of right humerus, subsequent encounter for fracture with routine healing: Secondary | ICD-10-CM | POA: Diagnosis not present

## 2023-08-20 DIAGNOSIS — R2689 Other abnormalities of gait and mobility: Secondary | ICD-10-CM | POA: Diagnosis not present

## 2023-08-20 DIAGNOSIS — M6281 Muscle weakness (generalized): Secondary | ICD-10-CM | POA: Diagnosis not present

## 2023-08-20 DIAGNOSIS — I351 Nonrheumatic aortic (valve) insufficiency: Secondary | ICD-10-CM

## 2023-08-20 DIAGNOSIS — S39013D Strain of muscle, fascia and tendon of pelvis, subsequent encounter: Secondary | ICD-10-CM | POA: Diagnosis not present

## 2023-08-20 DIAGNOSIS — I77819 Aortic ectasia, unspecified site: Secondary | ICD-10-CM

## 2023-08-20 DIAGNOSIS — R600 Localized edema: Secondary | ICD-10-CM | POA: Diagnosis not present

## 2023-08-20 DIAGNOSIS — I4819 Other persistent atrial fibrillation: Secondary | ICD-10-CM

## 2023-08-20 DIAGNOSIS — I10 Essential (primary) hypertension: Secondary | ICD-10-CM

## 2023-08-25 ENCOUNTER — Ambulatory Visit: Admitting: Physical Therapy

## 2023-08-25 ENCOUNTER — Other Ambulatory Visit (HOSPITAL_COMMUNITY)

## 2023-08-25 DIAGNOSIS — S42411D Displaced simple supracondylar fracture without intercondylar fracture of right humerus, subsequent encounter for fracture with routine healing: Secondary | ICD-10-CM | POA: Diagnosis not present

## 2023-08-25 DIAGNOSIS — M25521 Pain in right elbow: Secondary | ICD-10-CM

## 2023-08-25 DIAGNOSIS — M25621 Stiffness of right elbow, not elsewhere classified: Secondary | ICD-10-CM | POA: Diagnosis not present

## 2023-08-25 DIAGNOSIS — M25552 Pain in left hip: Secondary | ICD-10-CM

## 2023-08-25 DIAGNOSIS — R600 Localized edema: Secondary | ICD-10-CM | POA: Diagnosis not present

## 2023-08-25 DIAGNOSIS — M25611 Stiffness of right shoulder, not elsewhere classified: Secondary | ICD-10-CM | POA: Diagnosis not present

## 2023-08-25 DIAGNOSIS — S39013D Strain of muscle, fascia and tendon of pelvis, subsequent encounter: Secondary | ICD-10-CM | POA: Diagnosis not present

## 2023-08-25 DIAGNOSIS — M6281 Muscle weakness (generalized): Secondary | ICD-10-CM

## 2023-08-25 DIAGNOSIS — R2689 Other abnormalities of gait and mobility: Secondary | ICD-10-CM | POA: Diagnosis not present

## 2023-08-25 NOTE — Therapy (Addendum)
 OUTPATIENT PHYSICAL THERAPY PROGRESS NOTE/DISCHARGE SUMMARY   Patient Name: Michelle Johnston MRN: [id number] DOB:06/29/1950, 73 y.o., female Today's Date: 08/25/2023  END OF SESSION:  PT End of Session - 08/25/23 0929     Visit Number 4    Number of Visits 16    Date for PT Re-Evaluation 10/06/23    Authorization Type [employer] 16 visits 6/11-8/16    PT Start Time 0930    PT Stop Time 1010    PT Time Calculation (min) 40 min    Activity Tolerance Patient tolerated treatment well           Past Medical History:  Diagnosis Date   Anxiety    Arthritis    Atrial fibrillation (HCC)    Bradycardia    Bronchitis    Depression    Dyspnea    GERD (gastroesophageal reflux disease)    Glaucoma    Hypertension    Presence of Watchman left atrial appendage closure device 08/07/2021   Watchman FLX 27mm with Dr. Cindie   Seasonal allergies    Sleep apnea    does not use CPAP   Wears glasses    Past Surgical History:  Procedure Laterality Date   ABDOMINAL HYSTERECTOMY  1990   ATRIAL FIBRILLATION ABLATION N/A 08/12/2020   Procedure: ATRIAL FIBRILLATION ABLATION;  Surgeon: Cindie Ole DASEN, MD;  Location: [facility];  Service: Cardiovascular;  Laterality: N/A;   ATRIAL FIBRILLATION ABLATION N/A 03/26/2023   Procedure: ATRIAL FIBRILLATION ABLATION;  Surgeon: Cindie Ole DASEN, MD;  Location: [facility];  Service: Cardiovascular;  Laterality: N/A;   BREAST BIOPSY Right    benign   CARDIOVERSION N/A 08/15/2019   Procedure: CARDIOVERSION;  Surgeon: Elmira Newman PARAS, MD;  Location: [facility];  Service: Cardiovascular;  Laterality: N/A;   COLONOSCOPY     LEFT ATRIAL APPENDAGE OCCLUSION N/A 08/07/2021   Procedure: LEFT ATRIAL APPENDAGE OCCLUSION;  Surgeon: Cindie Ole DASEN, MD;  Location: [facility];  Service: Cardiovascular;  Laterality: N/A;   meniscus tear knee     left knee   NASAL SINUS SURGERY  1975   ORIF ANKLE FRACTURE  2009   left   ORIF  HUMERUS FRACTURE Right 07/15/2023   Procedure: OPEN REDUCTION INTERNAL FIXATION (ORIF) DISTAL HUMERUS FRACTURE;  Surgeon: Cristy Bonner DASEN, MD;  Location: [facility];  Service: Orthopedics;  Laterality: Right;   TEE WITHOUT CARDIOVERSION N/A 08/07/2021   Procedure: TRANSESOPHAGEAL ECHOCARDIOGRAM (TEE);  Surgeon: Cindie Ole DASEN, MD;  Location: [facility];  Service: Cardiovascular;  Laterality: N/A;   TOTAL KNEE ARTHROPLASTY Left 02/03/2019   Procedure: LEFT TOTAL KNEE ARTHROPLASTY;  Surgeon: Vernetta Lonni GRADE, MD;  Location: [facility];  Service: Orthopedics;  Laterality: Left;   TURBINATE REDUCTION Bilateral 06/04/2014   Procedure: BILATERAL TURBINATE REDUCTION;  Surgeon: Daniel Moccasin, MD;  Location:  SURGERY CENTER;  Service: ENT;  Laterality: Bilateral;   TYMPANOSTOMY TUBE PLACEMENT     left   Patient Active Problem List   Diagnosis Date Noted   Impacted cerumen of left ear 08/15/2023   Other specified disorders of eustachian tube, left ear 02/06/2023   Central perforation of tympanic membrane of left ear 02/06/2023   Presence of Watchman left atrial appendage closure device 08/07/2021   Atrial fibrillation (HCC) 08/07/2021   OSA (obstructive sleep apnea) 10/29/2020   Persistent atrial fibrillation (HCC) 03/27/2020   Hypercoagulable state due to persistent atrial fibrillation (HCC) 03/27/2020   PAD (peripheral  artery disease) (HCC) 12/27/2019   Exertional chest pain 12/27/2019   Primary osteoarthritis of right knee 07/12/2019   Paroxysmal atrial fibrillation (HCC) 06/16/2019   Nonrheumatic aortic valve insufficiency 06/16/2019   Nonrheumatic mitral valve regurgitation 06/16/2019   Tachycardia 02/20/2019   Abnormal EKG 02/20/2019   Essential hypertension 02/20/2019   Status post total left knee replacement 02/03/2019   Unilateral primary osteoarthritis, left knee 11/30/2016   Chronic pain of left knee 11/30/2016    PCP: Loreli Kins, MD  REFERRING  PROVIDER: Jennye Fitch PA-C/ Darryle Running, MD - hip  REFERRING DIAG: ORIF right supracondylar humerus fracture 07/15/23   THERAPY DIAG:  Right shoulder pain; right shoulder stiffness; weakness Gluteus medius and minimus tear and atrophy R  Rationale for Evaluation and Treatment: Rehabilitation  ONSET DATE: 07/01/23 elbow,  March - hip  SUBJECTIVE:                                                                                                                                                                                      SUBJECTIVE STATEMENT: I've been doing my ex's.  Elbow pain worse in the mornings.  Can't reach or pick up a lot.  No pain medication. My hip is really painful.  The more I do it helps with the pain but I think I need a harder band (using red).  No pain in hip sitting or lying, mostly with walking.  I went to my water  ex class last week.     LAST VISIT: In March, she had a fall trying to open the shower door and the frame came out. She fell on her R hip. She had  an MRI. Then she had the fall and everything was put on hold. She also needs a R knee replacement. The MRI shows a tear in the gluteals.     Hand dominance: Right  PERTINENT HISTORY: Right distal humerus Fx with s/p ORIF on 07/15/23 Afib with Watchman device; OA; HTN; left TKR; I need the right knee replaced  PAIN:  Are you having pain? Yes NPRS scale: 4/10 Pain location: right elbow Pain orientation: Right  PAIN TYPE: throbbing Pain description: constant  Aggravating factors: first thing in the morning, wiping butt, brush teeth; opening fridge door; pushing too far; sweeping, wash dishes (circular)  Relieving factors: ice,  hydrocodone, Tylenol  3-4x /day   Are you having pain? Yes NPRS scale: 6-7/10 Pain location: right hip  Pain orientation: Right  PAIN TYPE: throbbing Pain description: constant  Aggravating factors: walking and standing, sitting Relieving factors: lying down, ice,   hydrocodone, Tylenol  3-4x /day  PRECAUTIONS: Fall precautions, 10# lifting restriction due to recent humerus  ORIF on 07/15/23   WEIGHT BEARING RESTRICTIONS: No  FALLS:  Has patient fallen in last 6 months? Yes. Number of falls 2 both recently  LIVING ENVIRONMENT: Lives with: lives with their spouse Lives in: House/apartment Stairs - uses step-to gait up/ sometimes down Uses rail to pull herself up  OCCUPATION: retired  PLOF: Independent with basic ADLs  PATIENT GOALS:back to water  aerobics, cooking, cleaning  NEXT MD VISIT:  July 1st or 2nd  OBJECTIVE:  Note: Objective measures were completed at Evaluation unless otherwise noted.  DIAGNOSTIC FINDINGS:  Right supracondylar humeral fracture  PATIENT SURVEYS:  Eval:  Quick Dash 84.1%   Hip Eval: 08/20/23 Extreme difficulty/unable (0), Quite a bit of difficulty (1), Moderate difficulty (2), Little difficulty (3), No difficulty (4) Survey date:    Any of your usual work, housework or school activities 1  2. Usual hobbies, recreational or sporting activities 1  3. Getting into/out of the bath 4  4. Walking between rooms 2  5. Putting on socks/shoes 4  6. Squatting  0  7. Lifting an object, like a bag of groceries from the floor 2  8. Performing light activities around your home 2  9. Performing heavy activities around your home 0  10. Getting into/out of a car 3  11. Walking 2 blocks 0  12. Walking 1 mile 0  13. Going up/down 10 stairs (1 flight) 2  14. Standing for 1 hour 0  15.  sitting for 1 hour 2  16. Running on even ground 0  17. Running on uneven ground 0  18. Making sharp turns while running fast 0  19. Hopping  0  20. Rolling over in bed 4  Score total:  27     COGNITION: Overall cognitive status: Within functional limits for tasks assessed     APPEARANCE: Moderate elbow edema and bruising, incision appears well healed; bruising on both arms and face from recent falls  UPPER EXTREMITY ROM:  decreased  cervical ROM  Active ROM Right eval Left eval 6/25  Shoulder flexion 133 150   Shoulder extension     Shoulder abduction 166 173   Shoulder adduction     Shoulder internal rotation L4-5 T1   Shoulder external rotation 64 62   Elbow flexion 115    Elbow extension -26 pain  -20  Wrist flexion 47    Wrist extension 62    Wrist ulnar deviation 55 pain    Wrist radial deviation 15 pain    Wrist pronation pain    Wrist supination pain    (Blank rows = not tested)  UPPER EXTREMITY MMT:  MMT Right eval Left eval  Shoulder flexion 3 5  Shoulder extension 4 5  Shoulder abduction 3 5  Shoulder adduction    Shoulder internal rotation 3 5  Shoulder external rotation 3 5  Middle trapezius    Lower trapezius    Elbow flexion 2 pain 5  Elbow extension 2 pain 5  Wrist flexion 3 5  Wrist extension 3 5  Wrist ulnar deviation    Wrist radial deviation    Wrist pronation 2 pain 5  Wrist supination 2 pain 5  Grip strength (lbs) 10#   (Blank rows = not tested)   LOWER EXTREMITY MMT:    MMT Right eval Left eval  Hip flexion 4- 4  Hip extension 4+ 4+  Hip abduction 2+ 3  Hip adduction    Hip internal rotation    Hip external rotation  Knee flexion 4- 5  Knee extension 4+* 5  Ankle dorsiflexion 5 5  Ankle plantarflexion    Ankle inversion    Ankle eversion     (Blank rows = not tested)  LOWER EXTREMITY ROM:   WNL   MUSCLE LENGTH: tight B gastroc    Functional Test:   08/20/23: 5XSTS  24.41 sec pain in R knee 6-7/10    Gait: marked R Trendelenberg, unsteady gait with intermittent LOB                                                                                                                           TODAY'S TREATMENT DATE: 08/25/2023 UE Ranger seated 15x Standing UE Ranger on hi-lo table mid thigh height 15x Prone glute squeeze 10x Prone hip ext x 10 B lying over 1 pillow Sidelying UE clocks 12/6 9/3 10x each Bridge with green band around knees  x  10 Hooklying bil clam with green Tband x 10 (give green band for home) ; single leg only 5x right/left Sit to stand painful right knee even with sumo stance discontinued after 3x Standing right hip ABD only;  left hip abduction too painful with WB on right;  x10  leading with heel Red ball roll up wall emphasizing elbow extension within pain limits 10x (pt has a big ball at home) Thoracic extension with small ball 10x (left hand on neck)  Discussion of aquatic exercise class: open hand/fingers to decrease resistance on elbow in the pool; emphasize water  walking (not painful on hip) and include side stepping in the water ; pt is in chest deep water   DATE: 08/20/2023 Prone hip ext x 10 B lying over pillow Bridge x 10 Hooklying clam with green Tband x 10 Standing hip ABD 2 x10 B leading with heel Gait: with SPC left hand   DATE: 08/12/2023 Shoulder pulleys for flexion and abduction x2 min each Seated right wrist flexion/extension x15 Seated right wrist ulnar and radial deviation x15 each Seated right supination/pronation x15 Seated scapular retraction 2x10 Standing right shoulder UE ranger for flexion/extension on first, second, and third step 2x10 each Wall finger ladder for flexion x10 Right shoulder pendulums x10 (cuing for technique) Standing right shoulder scaption wall wash 2x10 Supine shoulder flexion with dowel rod 2x10 Supine bicep curl with yoga block under elbow to encourage elbow extension with 1# dumbbell x10 Education on biceps extension stretch at home Cold pack to right shoulder x10 min   DATE: 08/11/23 Evaluation Initial HEP Elevation for edema control Discussed cane in left hand for right knee pain control, balance for extended walking   PATIENT EDUCATION: Education details: PT eval findings, anticipated POC, initial HEP, gait safety with SPC, and education on tear and atrophy, discussion of balance deficits   Person educated: Patient Education method:  Explanation, Demonstration, Tactile cues, Verbal cues, and Handouts Education comprehension: verbalized understanding and returned demonstration  HOME EXERCISE PROGRAM: Access Code: [email or web address] URL: [email or web address] Date: 08/11/2023 Prepared by:  Glade Pesa  Exercises - Supine Shoulder Flexion AAROM with Hands Clasped  - 2-3 x daily - 7 x weekly - 2 sets - 8-10 reps - Seated Upper Trapezius Stretch  - 1 x daily - 7 x weekly - 1 sets - 3 reps - 30 hold - Seated Scapular Retraction  - 1 x daily - 7 x weekly - 1 sets - 10 reps - Circular Shoulder Pendulum with Table Support  - 1 x daily - 7 x weekly - 1 sets - 10 reps - Wrist Flexion Extension AROM with Fingers Curled and Palm Down  - 1 x daily - 7 x weekly - 1 sets - 10 reps  Added right elbow extension passive stretch on 08/12/23  Access Code: [email or web address]  hip URL: [email or web address] Date: 08/25/2023 Prepared by: Glade Pesa  Exercises - Supine Bridge  - 1 x daily - 7 x weekly - 1-3 sets - 10 reps - Hooklying Abduction with Resistance  - 1 x daily - 7 x weekly - 1-3 sets - 10 reps - Standing Hip Abduction with Counter Support  - 1 x daily - 7 x weekly - 1-3 sets - 10 reps - Prone Hip Extension - One Pillow  - 1 x daily - 7 x weekly - 1-3 sets - 10 reps - Prone Gluteal Sets  - 1 x daily - 7 x weekly - 1 sets - 10 reps  ASSESSMENT:  CLINICAL IMPRESSION: Much improved right elbow edema, bruising, pain and ROM since initial evaluation.  She demonstrates good compliance with her HEP.  She is painful with attempting elbow extension and limited by 20 degrees.   Reviewed hip HEP initiated last visit for right glute strengthening.  Able to progress non-weight bearing ex's in lying but she is unable to tolerate full weight bearing on right LE.  Modifications made to her HEP accordingly.  Therapist providing verbal cues to optimize technique with  exercises in order to achieve the greatest  benefit.     LAST VISIT:  Patient presents with a new order from the same practice for R gluteus med/min tear and atrophy s/p a fall in March. She demonstrates a R Trendelenberg gait pattern, has marked weakness in R hip ABD and has pain affecting walking, sitting and standing. She reports pain with most ADLS. Her LEFS of 27/80 indicates significant disability and her 5XSTS of 24.41 sec indicates she is a fall risk. She has a SPC, but has not been using it due to her elbow fracture. Educated patient that she should be using the cane in her L UE and we practiced this today in the clinic. She would likely benefit from a RW, but is limited by her elbow. She will benefit from skilled PT to address the above deficits. She will be seeing a hip specialist in 4 weeks and needs to strengthen as much as possible prior to this appointment. Patient would like to avoid surgery.     OBJECTIVE IMPAIRMENTS: Abnormal gait, decreased activity tolerance, decreased balance, decreased knowledge of use of DME, difficulty walking, decreased ROM, decreased strength, decreased safety awareness, impaired perceived functional ability, increased muscle spasms, impaired flexibility, impaired UE functional use, and pain.   ACTIVITY LIMITATIONS: carrying, lifting, sitting, standing, sleeping, stairs, transfers, dressing, reach over head, hygiene/grooming, and locomotion level  PARTICIPATION LIMITATIONS: meal prep, cleaning, laundry, driving, shopping, and community activity  PERSONAL FACTORS: 1-2 comorbidities: recent falls, knee OA, HTN are also affecting patient's functional outcome.   REHAB POTENTIAL: Good  CLINICAL DECISION MAKING: Stable/uncomplicated  EVALUATION COMPLEXITY: Low   GOALS: Goals reviewed with patient? Yes  SHORT TERM GOALS: Target date: 09/08/2023   The patient will demonstrate knowledge of basic self care strategies and exercises to promote healing  Baseline: Goal status: Ongoing  2.  The  patient will have improved shoulder elevation ROM to at least 150 degrees needed for reaching high shelves  Baseline:  Goal status: Ongoing  3.  The patient will have improved right elbow flexion to 125 degrees needed for grooming tasks  Baseline:  Goal status: INITIAL  4.  Improved elbow extension to 16 degrees needed for cooking and cleaning Baseline:  Goal status: INITIAL  5.  Right grip strength improved to 15# needed for carrying objects Baseline:  Goal status: INITIAL  6.  Independent with gait using a SPC or LRAD Baseline:  Goal status: INITIAL      LONG TERM GOALS: Target date: 10/06/2023   The patient will be independent in a safe self progression of a home exercise program to promote further recovery of function  Baseline:  Goal status: INITIAL  2.  The patient will have improved elbow ROM 10- 130 degrees needed for grooming/dressing purposes as well as cooking and cleaning  Baseline:  Goal status: INITIAL  3.  The patient will have grossly 4/5 strength needed to lift and lower a 3# object from a high shelf  Baseline:  Goal status: INITIAL  4.  Grip strength improved to 20# needed for carrying shopping bags Baseline:  Goal status: INITIAL  5.  Quick DASH score improved to   64%    indicating improved UE function with less pain Baseline:  Goal status: INITIAL   6.  Patient will report decreased pain in the left hip with ADLS by 75%. Baseline:  Goal status: INITIAL  7.  Patient will demonstrate improved functional LE strength as demonstrated by improved 5XSTS to 13 seconds. Baseline:  Goal status: INITIAL  8.  Improved L hip strength to 4/5 or better to normalize stairs, gait and balance.  Baseline:  Goal status: INITIAL  9. Improved LEFS by >= 9 points showing functional improvement. Baseline: 27 Goal status: INITIAL  10.  Patient will demonstrate at least 19/24 on DGI to decrease risk of falls. Baseline: TBD Goal status: INITIAL      PLAN:  PT FREQUENCY: 2x/week  PT DURATION: 8 weeks  PLANNED INTERVENTIONS: 02835- PT Re-evaluation, 97110-Therapeutic exercises, 97530- Therapeutic activity, 97112- Neuromuscular re-education, 97535- Self Care, 02859- Manual therapy, [phone]- Aquatic Therapy, 97016- Vasopneumatic device, [id number]- Ultrasound, 02966- Ionotophoresis 4mg /ml Dexamethasone , Patient/Family education, Taping, Joint mobilization, Cryotherapy, and Moist heat  PLAN FOR NEXT SESSION: SHOULDER: gentle passive and active assisted elbow ROM, shoulder pendulums, finger and wrist ROM;  vasocompression for edema control; aquatic PT     ORIF right supracondylar distal humerus fracture 07/15/23 HIP: focus on glut med/min strength/hip strength, gait with SPC, gastroc stretching, balance;  difficulty tolerating weight bearing on right   Glade Pesa, PT 08/25/23 10:29 AM Phone: [phone] Fax: [phone]  [facility] [address] [place], [place] [address] Phone # [phone] Fax [phone]   PHYSICAL THERAPY DISCHARGE SUMMARY  Visits from Start of Care: 4  Current functional level related to goals / functional outcomes: Patient fell and fractured her shoulder, requiring surgery.  Will discharge from PT secondary to a change in medical status.     Remaining deficits: As above   Education / Equipment: HEP   Patient agrees  to discharge. Patient goals were not met. Patient is being discharged due to a change in medical status.  Glade Pesa, PT 08/31/23 2:16 PM Phone: [phone] Fax: [phone]

## 2023-08-30 DIAGNOSIS — M25512 Pain in left shoulder: Secondary | ICD-10-CM | POA: Diagnosis not present

## 2023-08-31 ENCOUNTER — Encounter (HOSPITAL_BASED_OUTPATIENT_CLINIC_OR_DEPARTMENT_OTHER): Payer: Self-pay | Admitting: Anesthesiology

## 2023-08-31 ENCOUNTER — Ambulatory Visit

## 2023-08-31 ENCOUNTER — Ambulatory Visit
Admission: RE | Admit: 2023-08-31 | Discharge: 2023-08-31 | Disposition: A | Discharge status: Home / Self Care | Source: Ambulatory Visit | Attending: Orthopedic Surgery

## 2023-08-31 ENCOUNTER — Other Ambulatory Visit: Payer: Self-pay | Admitting: Orthopedic Surgery

## 2023-08-31 DIAGNOSIS — G8929 Other chronic pain: Secondary | ICD-10-CM

## 2023-08-31 DIAGNOSIS — R600 Localized edema: Secondary | ICD-10-CM | POA: Diagnosis not present

## 2023-08-31 DIAGNOSIS — S42292A Other displaced fracture of upper end of left humerus, initial encounter for closed fracture: Secondary | ICD-10-CM | POA: Diagnosis not present

## 2023-08-31 DIAGNOSIS — M25412 Effusion, left shoulder: Secondary | ICD-10-CM | POA: Diagnosis not present

## 2023-08-31 NOTE — Progress Notes (Signed)
 Patient's chart and most recent ECHO 08-19-23 showed severe aortic insufficiency. Chart was reviewed by Dr Patrisha and he states she will needs cards clearance and moved to Main OR. Sherri at Dr Raguel office made aware.

## 2023-09-01 ENCOUNTER — Encounter (HOSPITAL_COMMUNITY): Payer: Self-pay | Admitting: Orthopaedic Surgery

## 2023-09-01 ENCOUNTER — Other Ambulatory Visit: Payer: Self-pay

## 2023-09-01 NOTE — Progress Notes (Signed)
 SDW CALL  Patient was given pre-op instructions over the phone. The opportunity was given for the patient to ask questions. No further questions asked. Patient verbalized understanding of instructions given.   PCP - Loreli Kins, MD Cardiologist - Kate, Christopher,MD EP Cardiologist - Cindie Ole DASEN, MD   PPM/ICD - denies Device Orders -  Rep Notified -   Chest x-ray - na EKG - 06/24/23 Stress Test - 08/08/19 ECHO - 08/19/23 Cardiac Cath - denies  Sleep Study - +OSA CPAP - pt could not tolerate CPAP  Fasting Blood Sugar - na Checks Blood Sugar _____ times a day  Blood Thinner Instructions:na Aspirin  Instructions:hold DOS  ERAS Protcol - clears until 0650 PRE-SURGERY Ensure or G2- no  COVID TEST- na   Anesthesia review: yes-hx severe aortic insufficiency  Patient denies shortness of breath, fever, cough and chest pain over the phone call     As of today, STOP taking any Aspirin  (unless otherwise instructed by your surgeon) Aleve, Naproxen, Ibuprofen, Motrin, Advil, Goody's, BC's, all herbal medications, fish oil, and all vitamins.  Special instructions:    Oral Hygiene is also important to reduce your risk of infection.  Remember - BRUSH YOUR TEETH THE MORNING OF SURGERY WITH YOUR REGULAR TOOTHPASTE

## 2023-09-01 NOTE — Anesthesia Preprocedure Evaluation (Signed)
 Anesthesia Evaluation  Patient identified by MRN, date of birth, ID band Patient awake    Reviewed: Allergy & Precautions, H&P , NPO status , Patient's Chart, lab work & pertinent test results  Airway Mallampati: II  TM Distance: >3 FB Neck ROM: Full    Dental no notable dental hx. (+) Teeth Intact, Dental Advisory Given   Pulmonary sleep apnea , former smoker   Pulmonary exam normal breath sounds clear to auscultation       Cardiovascular hypertension, Pt. on medications + Peripheral Vascular Disease  + dysrhythmias Atrial Fibrillation + Valvular Problems/Murmurs AI  Rhythm:Regular Rate:Normal     Neuro/Psych   Anxiety Depression    negative neurological ROS     GI/Hepatic Neg liver ROS,GERD  Medicated,,  Endo/Other  negative endocrine ROS    Renal/GU negative Renal ROS  negative genitourinary   Musculoskeletal  (+) Arthritis , Osteoarthritis,    Abdominal   Peds  Hematology negative hematology ROS (+)   Anesthesia Other Findings   Reproductive/Obstetrics negative OB ROS                              Anesthesia Physical Anesthesia Plan  ASA: 3  Anesthesia Plan: General   Post-op Pain Management: Ofirmev  IV (intra-op)* and Regional block*   Induction: Intravenous  PONV Risk Score and Plan: 4 or greater and Ondansetron , Dexamethasone  and Treatment may vary due to age or medical condition  Airway Management Planned: Oral ETT  Additional Equipment:   Intra-op Plan:   Post-operative Plan: Extubation in OR  Informed Consent: I have reviewed the patients History and Physical, chart, labs and discussed the procedure including the risks, benefits and alternatives for the proposed anesthesia with the patient or authorized representative who has indicated his/her understanding and acceptance.     Dental advisory given  Plan Discussed with: CRNA  Anesthesia Plan Comments: (PAT  note by [name], PA-C: 73 year old female follows with cardiology for history of persistent AF s/p multiple ablations (most recent 03/2023), s/p Watchman device 07/2021, moderate MR, moderate-severe AI, OSA intolerant to CPAP.  Last seen by Jodie Passey, PA-C on 06/24/2023.  Per note, in NSR with PACs at that time.  Xarelto  was stopped given watchman in place.  ASA 81 mg daily was resumed.  Echo 08/19/2023 showed EF 65%, grade 2 DD, mild mitral regurgitation, moderate to severe aortic regurgitation which was felt to be stable.  Dr. Jeffrie recommended repeat echo in 1 year.  Dr. Kate also commented stating that her aortic regurgitation appeared more moderate and severe and recommended cardiac MRI to quantify severity.  This is scheduled to be done 10/20/2023.  Patient recently underwent ORIF of right distal humerus fracture on 07/15/2023 without complication.  She has unfortunately suffered a left proximal humerus fracture and now requires reverse total arthroplasty.  Other pertinent history includes former smoker (60 pack years, quit 2000), GERD on PPI.  History reviewed with anesthesiologist Dr. Lucious.  Advised patient to proceed as planned barring acute status change.  Will be evaluated by assigned anesthesiologist on day of surgery.  She will need day of surgery labs and evaluation.  EKG 06/24/2023: Sinus rhythm with 1st degree A-V block with Premature supraventricular complexes.  Rate 62. Incomplete right bundle branch block. Left anterior fascicular block  TTE 08/19/2023: 1. Left ventricular ejection fraction, by estimation, is 60 to 65%. Left  ventricular ejection fraction by 3D volume is 60 %. The left ventricle  has  normal function. The left ventricle has no regional wall motion  abnormalities. Left ventricular diastolic  parameters are consistent with Grade II diastolic dysfunction  (pseudonormalization).  2. Right ventricular systolic function is normal. The right ventricular  size  is normal.  3. Left atrial size was severely dilated.  4. Right atrial size was moderately dilated.  5. The mitral valve is normal in structure. Mild mitral valve  regurgitation. No evidence of mitral stenosis.  6. Tricuspid valve regurgitation is moderate.  7. The aortic valve is tricuspid. Aortic valve regurgitation is severe.  No aortic stenosis is present. Aortic regurgitation PHT measures 490 msec.  8. Aortic dilatation noted. There is mild dilatation of the ascending  aorta, measuring 41 mm.  9. The inferior vena cava is normal in size with greater than 50%  respiratory variability, suggesting right atrial pressure of 3 mmHg.   Coronary CTA 01/14/2023: IMPRESSION: 1. Minimal CAD, <25% stenosis, CADRADS 1.  2. Total plaque volume 19 mm3 which is 11th percentile for age- and sex-matched controls (calcified plaque 5 mm3; non-calcified plaque 14 mm3). TPV is mild.  3. Coronary calcium  score of 1. This was 29th percentile for age and sex matched control.  4. Normal coronary origins with right dominance.  5. Mild dilation of ascending aorta, 40 mm at the mid ascending aorta (level of the PA bifurcation) measured double oblique. Aortic atherosclerosis.  6. S/p 27 mm Watchman left atrial appendage occlusion (08/07/21). Device appears well seated with no peridevice leak and no device related thrombus.  7. Moderate dilation of main pulmonary artery, 34 mm, suggests increased pulmonary pressures.  RECOMMENDATIONS: CAD-RADS 1. Minimal non-obstructive CAD (0-24%). Consider non-atherosclerotic causes of chest pain. Consider preventive therapy and risk factor modification.   )         Anesthesia Quick Evaluation

## 2023-09-01 NOTE — Progress Notes (Signed)
 Anesthesia Chart Review: Same day workup  73 year old female follows with cardiology for history of persistent AF s/p multiple ablations (most recent 03/2023), s/p Watchman device 07/2021, moderate MR, moderate-severe AI, OSA intolerant to CPAP.  Last seen by Jodie Passey, PA-C on 06/24/2023.  Per note, in NSR with PACs at that time.  Xarelto  was stopped given watchman in place.  ASA 81 mg daily was resumed.  Echo 08/19/2023 showed EF 65%, grade 2 DD, mild mitral regurgitation, moderate to severe aortic regurgitation which was felt to be stable.  Dr. Jeffrie recommended repeat echo in 1 year.  Dr. Kate also commented stating that her aortic regurgitation appeared more moderate and severe and recommended cardiac MRI to quantify severity.  This is scheduled to be done 10/20/2023.  Patient recently underwent ORIF of right distal humerus fracture on 07/15/2023 without complication.  She has unfortunately suffered a left proximal humerus fracture and now requires reverse total arthroplasty.  Other pertinent history includes former smoker (60 pack years, quit 2000), GERD on PPI.  History reviewed with anesthesiologist Dr. Lucious.  Advised patient to proceed as planned barring acute status change.  Will be evaluated by assigned anesthesiologist on day of surgery.  She will need day of surgery labs and evaluation.  EKG 06/24/2023: Sinus rhythm with 1st degree A-V block with Premature supraventricular complexes.  Rate 62. Incomplete right bundle branch block. Left anterior fascicular block  TTE 08/19/2023:  1. Left ventricular ejection fraction, by estimation, is 60 to 65%. Left  ventricular ejection fraction by 3D volume is 60 %. The left ventricle has  normal function. The left ventricle has no regional wall motion  abnormalities. Left ventricular diastolic   parameters are consistent with Grade II diastolic dysfunction  (pseudonormalization).   2. Right ventricular systolic function is normal. The right  ventricular  size is normal.   3. Left atrial size was severely dilated.   4. Right atrial size was moderately dilated.   5. The mitral valve is normal in structure. Mild mitral valve  regurgitation. No evidence of mitral stenosis.   6. Tricuspid valve regurgitation is moderate.   7. The aortic valve is tricuspid. Aortic valve regurgitation is severe.  No aortic stenosis is present. Aortic regurgitation PHT measures 490 msec.   8. Aortic dilatation noted. There is mild dilatation of the ascending  aorta, measuring 41 mm.   9. The inferior vena cava is normal in size with greater than 50%  respiratory variability, suggesting right atrial pressure of 3 mmHg.   Coronary CTA 01/14/2023: IMPRESSION: 1. Minimal CAD, <25% stenosis, CADRADS 1.   2. Total plaque volume 19 mm3 which is 11th percentile for age- and sex-matched controls (calcified plaque 5 mm3; non-calcified plaque 14 mm3). TPV is mild.   3. Coronary calcium  score of 1. This was 29th percentile for age and sex matched control.   4. Normal coronary origins with right dominance.   5. Mild dilation of ascending aorta, 40 mm at the mid ascending aorta (level of the PA bifurcation) measured double oblique. Aortic atherosclerosis.   6. S/p 27 mm Watchman left atrial appendage occlusion (08/07/21). Device appears well seated with no peridevice leak and no device related thrombus.   7. Moderate dilation of main pulmonary artery, 34 mm, suggests increased pulmonary pressures.   RECOMMENDATIONS: CAD-RADS 1. Minimal non-obstructive CAD (0-24%). Consider non-atherosclerotic causes of chest pain. Consider preventive therapy and risk factor modification.      [name], PA-C [facility]/Anesthesiology Phone [phone]  09/01/2023 1:18 PM

## 2023-09-02 ENCOUNTER — Ambulatory Visit (HOSPITAL_COMMUNITY)

## 2023-09-02 ENCOUNTER — Ambulatory Visit (HOSPITAL_COMMUNITY)
Admission: RE | Admit: 2023-09-02 | Discharge: 2023-09-02 | Disposition: A | Discharge status: Home / Self Care | Attending: Orthopaedic Surgery | Admitting: Orthopaedic Surgery

## 2023-09-02 ENCOUNTER — Ambulatory Visit (HOSPITAL_COMMUNITY): Payer: Self-pay | Admitting: Anesthesiology

## 2023-09-02 ENCOUNTER — Other Ambulatory Visit: Payer: Self-pay

## 2023-09-02 ENCOUNTER — Encounter (HOSPITAL_COMMUNITY)
Admission: RE | Disposition: A | Discharge status: Home / Self Care | Payer: Self-pay | Source: Home / Self Care | Attending: Orthopaedic Surgery

## 2023-09-02 ENCOUNTER — Ambulatory Visit

## 2023-09-02 ENCOUNTER — Ambulatory Visit (HOSPITAL_BASED_OUTPATIENT_CLINIC_OR_DEPARTMENT_OTHER): Payer: Self-pay | Admitting: Anesthesiology

## 2023-09-02 ENCOUNTER — Encounter (HOSPITAL_COMMUNITY): Payer: Self-pay | Admitting: Orthopaedic Surgery

## 2023-09-02 ENCOUNTER — Ambulatory Visit (HOSPITAL_BASED_OUTPATIENT_CLINIC_OR_DEPARTMENT_OTHER): Admission: RE | Admit: 2023-09-02 | Source: Home / Self Care | Admitting: Orthopaedic Surgery

## 2023-09-02 ENCOUNTER — Encounter (HOSPITAL_BASED_OUTPATIENT_CLINIC_OR_DEPARTMENT_OTHER): Admission: RE | Payer: Self-pay | Source: Home / Self Care

## 2023-09-02 DIAGNOSIS — S42252A Displaced fracture of greater tuberosity of left humerus, initial encounter for closed fracture: Secondary | ICD-10-CM | POA: Diagnosis not present

## 2023-09-02 DIAGNOSIS — I350 Nonrheumatic aortic (valve) stenosis: Secondary | ICD-10-CM | POA: Diagnosis not present

## 2023-09-02 DIAGNOSIS — S42201D Unspecified fracture of upper end of right humerus, subsequent encounter for fracture with routine healing: Secondary | ICD-10-CM | POA: Diagnosis not present

## 2023-09-02 DIAGNOSIS — K219 Gastro-esophageal reflux disease without esophagitis: Secondary | ICD-10-CM | POA: Insufficient documentation

## 2023-09-02 DIAGNOSIS — M1990 Unspecified osteoarthritis, unspecified site: Secondary | ICD-10-CM | POA: Insufficient documentation

## 2023-09-02 DIAGNOSIS — Z87891 Personal history of nicotine dependence: Secondary | ICD-10-CM | POA: Insufficient documentation

## 2023-09-02 DIAGNOSIS — G4730 Sleep apnea, unspecified: Secondary | ICD-10-CM | POA: Diagnosis not present

## 2023-09-02 DIAGNOSIS — Z79899 Other long term (current) drug therapy: Secondary | ICD-10-CM | POA: Insufficient documentation

## 2023-09-02 DIAGNOSIS — F419 Anxiety disorder, unspecified: Secondary | ICD-10-CM | POA: Diagnosis not present

## 2023-09-02 DIAGNOSIS — S42292A Other displaced fracture of upper end of left humerus, initial encounter for closed fracture: Secondary | ICD-10-CM | POA: Diagnosis not present

## 2023-09-02 DIAGNOSIS — M25712 Osteophyte, left shoulder: Secondary | ICD-10-CM | POA: Diagnosis not present

## 2023-09-02 DIAGNOSIS — S42202A Unspecified fracture of upper end of left humerus, initial encounter for closed fracture: Secondary | ICD-10-CM | POA: Diagnosis not present

## 2023-09-02 DIAGNOSIS — Z791 Long term (current) use of non-steroidal anti-inflammatories (NSAID): Secondary | ICD-10-CM | POA: Diagnosis not present

## 2023-09-02 DIAGNOSIS — I4891 Unspecified atrial fibrillation: Secondary | ICD-10-CM | POA: Insufficient documentation

## 2023-09-02 DIAGNOSIS — I739 Peripheral vascular disease, unspecified: Secondary | ICD-10-CM | POA: Insufficient documentation

## 2023-09-02 DIAGNOSIS — F32A Depression, unspecified: Secondary | ICD-10-CM | POA: Insufficient documentation

## 2023-09-02 DIAGNOSIS — G8918 Other acute postprocedural pain: Secondary | ICD-10-CM | POA: Diagnosis not present

## 2023-09-02 DIAGNOSIS — I700 Atherosclerosis of aorta: Secondary | ICD-10-CM | POA: Diagnosis not present

## 2023-09-02 DIAGNOSIS — I2510 Atherosclerotic heart disease of native coronary artery without angina pectoris: Secondary | ICD-10-CM | POA: Diagnosis not present

## 2023-09-02 DIAGNOSIS — Z7982 Long term (current) use of aspirin: Secondary | ICD-10-CM | POA: Insufficient documentation

## 2023-09-02 DIAGNOSIS — W19XXXA Unspecified fall, initial encounter: Secondary | ICD-10-CM | POA: Diagnosis not present

## 2023-09-02 DIAGNOSIS — S42201A Unspecified fracture of upper end of right humerus, initial encounter for closed fracture: Secondary | ICD-10-CM | POA: Diagnosis present

## 2023-09-02 DIAGNOSIS — I10 Essential (primary) hypertension: Secondary | ICD-10-CM | POA: Insufficient documentation

## 2023-09-02 DIAGNOSIS — Z96612 Presence of left artificial shoulder joint: Secondary | ICD-10-CM | POA: Diagnosis not present

## 2023-09-02 HISTORY — PX: REVERSE SHOULDER ARTHROPLASTY: SHX5054

## 2023-09-02 LAB — BASIC METABOLIC PANEL WITH GFR
Anion gap: 13 (ref 5–15)
BUN: 7 mg/dL — ABNORMAL LOW (ref 8–23)
CO2: 23 mmol/L (ref 22–32)
Calcium: 8.6 mg/dL — ABNORMAL LOW (ref 8.9–10.3)
Chloride: 90 mmol/L — ABNORMAL LOW (ref 98–111)
Creatinine, Ser: 0.54 mg/dL (ref 0.44–1.00)
GFR, Estimated: >60 mL/min (ref >60)
Glucose, Bld: 104 mg/dL — ABNORMAL HIGH (ref 70–99)
Potassium: 3 mmol/L — ABNORMAL LOW (ref 3.5–5.1)
Sodium: 126 mmol/L — ABNORMAL LOW (ref 135–145)

## 2023-09-02 LAB — CBC
HCT: 29.9 % — ABNORMAL LOW (ref 36.0–46.0)
Hemoglobin: 10.3 g/dL — ABNORMAL LOW (ref 12.0–15.0)
MCH: 34.6 pg — ABNORMAL HIGH (ref 26.0–34.0)
MCHC: 34.4 g/dL (ref 30.0–36.0)
MCV: 100.3 fL — ABNORMAL HIGH (ref 80.0–100.0)
Platelets: 247 10*3/uL (ref 150–400)
RBC: 2.98 MIL/uL — ABNORMAL LOW (ref 3.87–5.11)
RDW: 11.7 % (ref 11.5–15.5)
WBC: 5.9 10*3/uL (ref 4.0–10.5)
nRBC: 0 % (ref 0.0–0.2)

## 2023-09-02 LAB — SURGICAL PCR SCREEN
MRSA, PCR: NEGATIVE
Staphylococcus aureus: NEGATIVE

## 2023-09-02 SURGERY — ARTHROPLASTY, SHOULDER, TOTAL, REVERSE
Anesthesia: General | Site: Shoulder | Laterality: Left

## 2023-09-02 MED ORDER — FENTANYL CITRATE (PF) 250 MCG/5ML IJ SOLN
INTRAMUSCULAR | Status: AC
  Filled 2023-09-02: qty 5

## 2023-09-02 MED ORDER — VANCOMYCIN HCL 1000 MG IV SOLR
INTRAVENOUS | Status: DC | PRN
  Administered 2023-09-02: 1000 mg via INTRAVENOUS

## 2023-09-02 MED ORDER — BUPIVACAINE LIPOSOME 1.3 % IJ SUSP
INTRAMUSCULAR | Status: AC
  Filled 2023-09-02: qty 10

## 2023-09-02 MED ORDER — VANCOMYCIN HCL 1000 MG IV SOLR
INTRAVENOUS | Status: DC | PRN
  Administered 2023-09-02: 1000 mg via TOPICAL

## 2023-09-02 MED ORDER — ORAL CARE MOUTH RINSE: 15.0000 mL | Freq: Once | OROMUCOSAL | Status: AC

## 2023-09-02 MED ORDER — GENTAMICIN SULFATE 40 MG/ML IJ SOLN
5.0000 mg/kg | INTRAVENOUS | Status: AC
  Administered 2023-09-02: 340 mg via INTRAVENOUS
  Filled 2023-09-02: qty 8.5

## 2023-09-02 MED ORDER — VANCOMYCIN HCL IN DEXTROSE 1-5 GM/200ML-% IV SOLN
INTRAVENOUS | Status: AC
  Administered 2023-09-02: 1000 mg via INTRAVENOUS
  Filled 2023-09-02: qty 200

## 2023-09-02 MED ORDER — ONDANSETRON HCL 4 MG/2ML IJ SOLN
INTRAMUSCULAR | Status: DC | PRN
  Administered 2023-09-02: 4 mg via INTRAVENOUS

## 2023-09-02 MED ORDER — PHENYLEPHRINE HCL-NACL 20-0.9 MG/250ML-% IV SOLN
INTRAVENOUS | Status: DC | PRN
  Administered 2023-09-02: 30 ug/min via INTRAVENOUS

## 2023-09-02 MED ORDER — PROPOFOL 10 MG/ML IV BOLUS
INTRAVENOUS | Status: DC | PRN
  Administered 2023-09-02: 150 mg via INTRAVENOUS

## 2023-09-02 MED ORDER — LACTATED RINGERS IV SOLN: INTRAVENOUS | Status: DC

## 2023-09-02 MED ORDER — VANCOMYCIN HCL IN DEXTROSE 1-5 GM/200ML-% IV SOLN: 1000.0000 mg | INTRAVENOUS | Status: AC

## 2023-09-02 MED ORDER — DEXAMETHASONE SODIUM PHOSPHATE 10 MG/ML IJ SOLN
INTRAMUSCULAR | Status: DC | PRN
  Administered 2023-09-02: 10 mg via INTRAVENOUS

## 2023-09-02 MED ORDER — EPHEDRINE SULFATE-NACL 50-0.9 MG/10ML-% IV SOSY
PREFILLED_SYRINGE | INTRAVENOUS | Status: DC | PRN
  Administered 2023-09-02 (×4): 10 mg via INTRAVENOUS

## 2023-09-02 MED ORDER — FENTANYL CITRATE (PF) 100 MCG/2ML IJ SOLN: 25.0000 ug | Freq: Once | INTRAMUSCULAR | Status: AC

## 2023-09-02 MED ORDER — FENTANYL CITRATE (PF) 100 MCG/2ML IJ SOLN
INTRAMUSCULAR | Status: AC
  Administered 2023-09-02: 25 ug via INTRAVENOUS
  Filled 2023-09-02: qty 2

## 2023-09-02 MED ORDER — BUPIVACAINE LIPOSOME 1.3 % IJ SUSP
INTRAMUSCULAR | Status: DC | PRN
  Administered 2023-09-02: 10 mL via PERINEURAL

## 2023-09-02 MED ORDER — TRANEXAMIC ACID-NACL 1000-0.7 MG/100ML-% IV SOLN
INTRAVENOUS | Status: AC
  Filled 2023-09-02: qty 100

## 2023-09-02 MED ORDER — PROPOFOL 10 MG/ML IV BOLUS
INTRAVENOUS | Status: AC
  Filled 2023-09-02: qty 20

## 2023-09-02 MED ORDER — FENTANYL CITRATE (PF) 100 MCG/2ML IJ SOLN
50.0000 ug | Freq: Once | INTRAMUSCULAR | Status: AC
  Administered 2023-09-02: 50 ug via INTRAVENOUS

## 2023-09-02 MED ORDER — FENTANYL CITRATE (PF) 100 MCG/2ML IJ SOLN: 25.0000 ug | INTRAMUSCULAR | Status: DC | PRN

## 2023-09-02 MED ORDER — SUGAMMADEX SODIUM 200 MG/2ML IV SOLN
INTRAVENOUS | Status: DC | PRN
  Administered 2023-09-02: 200 mg via INTRAVENOUS

## 2023-09-02 MED ORDER — CHLORHEXIDINE GLUCONATE 0.12 % MT SOLN
OROMUCOSAL | Status: AC
  Administered 2023-09-02: 15 mL via OROMUCOSAL
  Filled 2023-09-02: qty 15

## 2023-09-02 MED ORDER — FENTANYL CITRATE (PF) 250 MCG/5ML IJ SOLN
INTRAMUSCULAR | Status: DC | PRN
  Administered 2023-09-02 (×2): 50 ug via INTRAVENOUS

## 2023-09-02 MED ORDER — TRANEXAMIC ACID-NACL 1000-0.7 MG/100ML-% IV SOLN
1000.0000 mg | INTRAVENOUS | Status: AC
  Administered 2023-09-02: 1000 mg via INTRAVENOUS

## 2023-09-02 MED ORDER — VANCOMYCIN HCL 1000 MG IV SOLR
INTRAVENOUS | Status: AC
  Filled 2023-09-02: qty 20

## 2023-09-02 MED ORDER — LACTATED RINGERS IV SOLN
INTRAVENOUS | Status: DC | PRN
Start: 2023-09-02 — End: 2023-09-02

## 2023-09-02 MED ORDER — ROCURONIUM BROMIDE 10 MG/ML (PF) SYRINGE
PREFILLED_SYRINGE | INTRAVENOUS | Status: DC | PRN
  Administered 2023-09-02: 50 mg via INTRAVENOUS

## 2023-09-02 MED ORDER — CHLORHEXIDINE GLUCONATE 0.12 % MT SOLN: 15.0000 mL | Freq: Once | OROMUCOSAL | Status: AC

## 2023-09-02 MED ORDER — 0.9 % SODIUM CHLORIDE (POUR BTL) OPTIME
TOPICAL | Status: DC | PRN
  Administered 2023-09-02: 1000 mL

## 2023-09-02 MED ORDER — BUPIVACAINE-EPINEPHRINE (PF) 0.5% -1:200000 IJ SOLN
INTRAMUSCULAR | Status: DC | PRN
  Administered 2023-09-02: 15 mL via PERINEURAL

## 2023-09-02 MED ORDER — LIDOCAINE 2% (20 MG/ML) 5 ML SYRINGE
INTRAMUSCULAR | Status: DC | PRN
  Administered 2023-09-02: 60 mg via INTRAVENOUS

## 2023-09-02 SURGICAL SUPPLY — 69 items
BAG COUNTER SPONGE SURGICOUNT (BAG) ×2 IMPLANT
BASEPLATE GLENOID RSA 3X25 0D (Shoulder) ×1 IMPLANT
BIT DRILL 3.2 PERIPHERAL SCREW (BIT) ×1 IMPLANT
BLADE SAW SGTL 73X25 THK (BLADE) ×2 IMPLANT
CHLORAPREP W/TINT 26 (MISCELLANEOUS) ×4 IMPLANT
COVER SURGICAL LIGHT HANDLE (MISCELLANEOUS) ×4 IMPLANT
DRAPE C-ARM 42X72 X-RAY (DRAPES) ×2 IMPLANT
DRAPE HALF SHEET 40X57 (DRAPES) ×2 IMPLANT
DRAPE IMP U-DRAPE 54X76 (DRAPES) ×2 IMPLANT
DRAPE INCISE IOBAN 66X45 STRL (DRAPES) ×4 IMPLANT
DRAPE SURG 17X23 STRL (DRAPES) ×2 IMPLANT
DRAPE SURG ORHT 6 SPLT 77X108 (DRAPES) ×4 IMPLANT
DRAPE SWITCH (DRAPES) ×2 IMPLANT
DRAPE U-SHAPE 47X51 STRL (DRAPES) ×2 IMPLANT
DRSG AQUACEL AG ADV 3.5X 6 (GAUZE/BANDAGES/DRESSINGS) ×2 IMPLANT
DRSG MEPILEX POST OP 4X8 (GAUZE/BANDAGES/DRESSINGS) IMPLANT
ELECTRODE BLDE 4.0 EZ CLN MEGD (MISCELLANEOUS) IMPLANT
ELECTRODE REM PT RTRN 9FT ADLT (ELECTROSURGICAL) ×2 IMPLANT
GAUZE SPONGE 4X4 12PLY STRL (GAUZE/BANDAGES/DRESSINGS) IMPLANT
GAUZE XEROFORM 1X8 LF (GAUZE/BANDAGES/DRESSINGS) ×1 IMPLANT
GLENOSPHERE REV SHOULDER 36 (Joint) ×1 IMPLANT
GLOVE BIO SURGEON STRL SZ 6.5 (GLOVE) ×2 IMPLANT
GLOVE BIOGEL PI IND STRL 8 (GLOVE) ×2 IMPLANT
GLOVE ECLIPSE 8.0 STRL XLNG CF (GLOVE) ×4 IMPLANT
GLOVE INDICATOR 6.5 STRL GRN (GLOVE) ×2 IMPLANT
GOWN STRL REUS W/ TWL LRG LVL3 (GOWN DISPOSABLE) ×6 IMPLANT
GOWN STRL REUS W/ TWL XL LVL3 (GOWN DISPOSABLE) ×2 IMPLANT
GUIDEWIRE GLENOID 2.5X220 (WIRE) ×1 IMPLANT
INSERT HUMERAL +3 SZ1/2 .36 (Joint) ×1 IMPLANT
KIT BASIN OR (CUSTOM PROCEDURE TRAY) ×2 IMPLANT
KIT STABILIZATION SHOULDER (MISCELLANEOUS) ×2 IMPLANT
KIT TURNOVER KIT B (KITS) ×2 IMPLANT
MANIFOLD NEPTUNE II (INSTRUMENTS) ×2 IMPLANT
NDL 22X1.5 STRL (OR ONLY) (MISCELLANEOUS) IMPLANT
NDL HYPO 25GX1X1/2 BEV (NEEDLE) IMPLANT
NDL MAYO TROCAR (NEEDLE) IMPLANT
NDL SUT .5 MAYO 1.404X.05X (NEEDLE) ×2 IMPLANT
NDL SUT 2 .5 CRC MAYO 1.732X (NEEDLE) ×2 IMPLANT
NEEDLE 22X1.5 STRL (OR ONLY) (MISCELLANEOUS) IMPLANT
NEEDLE HYPO 25GX1X1/2 BEV (NEEDLE) IMPLANT
NEEDLE MAYO TROCAR (NEEDLE) IMPLANT
NS IRRIG 1000ML POUR BTL (IV SOLUTION) ×2 IMPLANT
PACK SHOULDER (CUSTOM PROCEDURE TRAY) ×2 IMPLANT
PAD ARMBOARD POSITIONER FOAM (MISCELLANEOUS) ×4 IMPLANT
RESTRAINT HEAD UNIVERSAL NS (MISCELLANEOUS) ×2 IMPLANT
SCREW 5.5X26 (Screw) ×1 IMPLANT
SCREW BONE THREAD 6.5X35 (Screw) ×1 IMPLANT
SCREW PERIPHERAL 30 (Screw) ×1 IMPLANT
SET HNDPC FAN SPRY TIP SCT (DISPOSABLE) ×2 IMPLANT
SLING ARM IMMOBILIZER LRG (SOFTGOODS) ×2 IMPLANT
SPONGE T-LAP 18X18 ~~LOC~~+RFID (SPONGE) ×4 IMPLANT
STAPLER SKIN PROX 35W (STAPLE) ×2 IMPLANT
STEM PERFORM FX STD 9S L130 (Stem) ×1 IMPLANT
STRIP CLOSURE SKIN 1/2X4 (GAUZE/BANDAGES/DRESSINGS) ×2 IMPLANT
SUCTION TUBE FRAZIER 10FR DISP (SUCTIONS) ×2 IMPLANT
SUT ETHIBOND 2 V 37 (SUTURE) ×2 IMPLANT
SUT ETHIBOND NAB CT1 #1 30IN (SUTURE) ×2 IMPLANT
SUT ETHILON 3 0 PS 1 (SUTURE) ×1 IMPLANT
SUT MNCRL AB 3-0 PS2 18 (SUTURE) ×1 IMPLANT
SUT VIC AB 2-0 CT1 TAPERPNT 27 (SUTURE) ×4 IMPLANT
SUTURE FIBERWR #5 38 CONV NDL (SUTURE) ×6 IMPLANT
SYR CONTROL 10ML LL (SYRINGE) IMPLANT
TOWEL GREEN STERILE (TOWEL DISPOSABLE) ×2 IMPLANT
TOWEL GREEN STERILE FF (TOWEL DISPOSABLE) ×2 IMPLANT
TRAY FOLEY W/BAG SLVR 14FR (SET/KITS/TRAYS/PACK) IMPLANT
TUBE CONNECTING 12X1/4 (SUCTIONS) ×2 IMPLANT
TUBE SUCT ARGYLE STRL (TUBING) ×1 IMPLANT
WATER STERILE IRR 1000ML POUR (IV SOLUTION) ×2 IMPLANT
YANKAUER SUCT BULB TIP NO VENT (SUCTIONS) ×2 IMPLANT

## 2023-09-02 NOTE — H&P (Signed)
 PREOPERATIVE H&P  Chief Complaint: displaced fracture of upper end of humerus, LEFT  HPI: Michelle Johnston is a 73 y.o. female who is scheduled for Procedure(s): ARTHROPLASTY, SHOULDER, TOTAL, REVERSE OPEN REDUCTION INTERNAL FIXATION (ORIF) PROXIMAL HUMERUS FRACTURE.   Patient is well known to me, status post right distal humerus ORIF on Jul 15, 2023. She's done relatively well with that procedure. Patient had a fall and sustained an injury to her left arm. She was [facility]. She's had significant pain. She did not injure herself anywhere else. Her right arm continues to do reasonably well.   Symptoms are rated as moderate to severe, and have been worsening.  This is significantly impairing activities of daily living.    Please see clinic note for further details on this patient's care.    She has elected for surgical management.   Past Medical History:  Diagnosis Date   Anxiety    Arthritis    Atrial fibrillation (HCC)    Bradycardia    Bronchitis    Depression    Dyspnea    GERD (gastroesophageal reflux disease)    Glaucoma    Hypertension    Presence of Watchman left atrial appendage closure device 08/07/2021   Watchman FLX 27mm with Dr. Cindie   Seasonal allergies    Sleep apnea    does not use CPAP   Wears glasses    Past Surgical History:  Procedure Laterality Date   ABDOMINAL HYSTERECTOMY  1990   ATRIAL FIBRILLATION ABLATION N/A 08/12/2020   Procedure: ATRIAL FIBRILLATION ABLATION;  Surgeon: Cindie Ole DASEN, MD;  Location: [facility];  Service: Cardiovascular;  Laterality: N/A;   ATRIAL FIBRILLATION ABLATION N/A 03/26/2023   Procedure: ATRIAL FIBRILLATION ABLATION;  Surgeon: Cindie Ole DASEN, MD;  Location: [facility];  Service: Cardiovascular;  Laterality: N/A;   BREAST BIOPSY Right    benign   CARDIOVERSION N/A 08/15/2019   Procedure: CARDIOVERSION;  Surgeon: Elmira Newman PARAS, MD;  Location: [facility];  Service:  Cardiovascular;  Laterality: N/A;   COLONOSCOPY     LEFT ATRIAL APPENDAGE OCCLUSION N/A 08/07/2021   Procedure: LEFT ATRIAL APPENDAGE OCCLUSION;  Surgeon: Cindie Ole DASEN, MD;  Location: [facility];  Service: Cardiovascular;  Laterality: N/A;   meniscus tear knee     left knee   NASAL SINUS SURGERY  1975   ORIF ANKLE FRACTURE  2009   left   ORIF HUMERUS FRACTURE Right 07/15/2023   Procedure: OPEN REDUCTION INTERNAL FIXATION (ORIF) DISTAL HUMERUS FRACTURE;  Surgeon: Cristy Bonner DASEN, MD;  Location: [facility];  Service: Orthopedics;  Laterality: Right;   TEE WITHOUT CARDIOVERSION N/A 08/07/2021   Procedure: TRANSESOPHAGEAL ECHOCARDIOGRAM (TEE);  Surgeon: Cindie Ole DASEN, MD;  Location: [facility] INV[facility] CV LAB;  Service: Cardiovascular;  Laterality: N/A;   TOTAL KNEE ARTHROPLASTY Left 02/03/2019   Procedure: LEFT TOTAL KNEE ARTHROPLASTY;  Surgeon: Vernetta Lonni GRADE, MD;  Location: [facility];  Service: Orthopedics;  Laterality: Left;   TURBINATE REDUCTION Bilateral 06/04/2014   Procedure: BILATERAL TURBINATE REDUCTION;  Surgeon: Daniel Moccasin, MD;  Location: [facility];  Service: ENT;  Laterality: Bilateral;   TYMPANOSTOMY TUBE PLACEMENT     left   Social History   Socioeconomic History   Marital status: Married    Spouse name: Not on file   Number of children: 0   Years of education: Not on file   Highest education level: Not on file  Occupational History   Not on file  Tobacco Use   Smoking status: Former    Current packs/day: 0.00    Average packs/day: 2.0 packs/day for 30.0 years (60.0 ttl pk-yrs)    Types: Cigarettes    Start date: 05/27/1968    Quit date: 05/28/1998    Years since quitting: 25.2   Smokeless tobacco: Never  Vaping Use   Vaping status: Never Used  Substance and Sexual Activity   Alcohol use: Yes    Alcohol/week: 3.0 - 4.0 standard drinks of alcohol    Types: 2 Glasses of wine, 1 - 2 Cans of beer per week    Comment: daily-wine  daily-1 glass   Drug use: No   Sexual activity: Not Currently    Comment: hyst  Other Topics Concern   Not on file  Social History Narrative   Not on file   Social Drivers of Health   Financial Resource Strain: Not on file  Food Insecurity: Not on file  Transportation Needs: Not on file  Physical Activity: Not on file  Stress: Not on file  Social Connections: Unknown (07/15/2021)   Received from [employer]   Social Network    Social Network: Not on file   Family History  Problem Relation Age of Onset   Hypertension Mother    Hyperlipidemia Mother    Lymphoma Mother    Lung cancer Father    Allergies  Allergen Reactions   Dexlansoprazole Other (See Comments)    ineffective   Flecainide      Pt had stroke Possible stroke   Nasal Spray     Nasal Steroids, Head Congestion   Zetia  [Ezetimibe ] Nausea Only   Cefaclor Hives and Rash   Ciprofibrate Rash    (see OV 12/24/2011)   Ciprofloxacin  Hives   Metronidazole  Hives    Other reaction(s): rash ?? (see OV 12/24/2011)   Prior to Admission medications   Medication Sig Start Date End Date Taking? Authorizing Provider  acetaminophen  (TYLENOL ) 500 MG tablet Take 1,000 mg by mouth every 6 (six) hours as needed for moderate pain (pain score 4-6).   Yes [provider]  albuterol  (VENTOLIN  HFA) 108 (90 Base) MCG/ACT inhaler Inhale 1-2 puffs into the lungs every 6 (six) hours as needed for wheezing or shortness of breath.   Yes [provider]  amLODipine  (NORVASC ) 10 MG tablet Take 10 mg by mouth at bedtime.   Yes [provider]  aspirin  EC 81 MG tablet Take 1 tablet (81 mg total) by mouth daily. Swallow whole. 06/24/23  Yes Lesia Ozell Barter, PA-C  b complex vitamins tablet Take 1 tablet by mouth daily.   Yes [provider]  calcium  carbonate (OS-CAL - DOSED IN MG OF ELEMENTAL CALCIUM ) 1250 (500 Ca) MG tablet Take 1 tablet by mouth daily with breakfast.   Yes [provider]   carvedilol  (COREG ) 6.25 MG tablet TAKE 1 TABLET BY MOUTH TWICE DAILY WITH A MEAL 02/18/23  Yes Cindie Ole DASEN, MD  cetirizine (ZYRTEC) 10 MG tablet Take 10 mg by mouth at bedtime.   Yes [provider]  Cholecalciferol  (VITAMIN D) 50 MCG (2000 UT) tablet Take 2,000 Units by mouth daily.   Yes [provider]  cyanocobalamin  (VITAMIN B12) 1000 MCG/ML injection Inject 1,000 mcg into the muscle every Sunday. 05/29/22  Yes [provider]  FLUoxetine  (PROZAC ) 40 MG capsule Take 40 mg by mouth daily. 02/21/20  Yes [provider]  gabapentin  (NEURONTIN ) 300 MG capsule  Take 300-600 mg by mouth See admin instructions. Take 300 mg in the morning and 600 mg at night 04/19/20  Yes [provider]  ipratropium (ATROVENT) 0.06 % nasal spray Place 1 spray into both nostrils daily as needed for rhinitis. 09/12/19  Yes [provider]  latanoprost  (XALATAN ) 0.005 % ophthalmic solution Place 1 drop into both eyes every morning.    Yes [provider]  meloxicam  (MOBIC ) 15 MG tablet Take 1 tablet (15 mg total) by mouth daily. 07/15/23  Yes Jammy Stlouis, Aleck SAILOR, PA-C  methylphenidate (RITALIN) 10 MG tablet Take 5 mg by mouth daily. 12/15/21  Yes [provider]  montelukast  (SINGULAIR ) 10 MG tablet Take 10 mg by mouth daily as needed (allergies).   Yes [provider]  Multiple Vitamins-Minerals (MULTIVITAMIN WITH MINERALS) tablet Take 1 tablet by mouth daily.   Yes [provider]  nitroGLYCERIN  (NITROSTAT ) 0.4 MG SL tablet Place 0.4 mg under the tongue every 5 (five) minutes as needed for chest pain.   Yes [provider]  Omega-3 Fatty Acids (FISH OIL) 1000 MG CAPS Take 1,000 mg by mouth daily.   Yes [provider]  omeprazole (PRILOSEC) 20 MG capsule Take 20 mg by mouth daily as needed (acid reflux). 12/14/19  Yes [provider]  rosuvastatin  (CRESTOR ) 20 MG tablet Take 1 tablet by mouth once daily  04/14/23  Yes Kate Lonni CROME, MD  vitamin C  (ASCORBIC ACID ) 500 MG tablet Take 500 mg by mouth daily.   Yes [provider]    ROS: All other systems have been reviewed and were otherwise negative with the exception of those mentioned in the HPI and as above.  Physical Exam: General: Alert, no acute distress Cardiovascular: No pedal edema Respiratory: No cyanosis, no use of accessory musculature GI: No organomegaly, abdomen is soft and non-tender Skin: No lesions in the area of chief complaint Neurologic: Sensation intact distally Psychiatric: Patient is competent for consent with normal mood and affect Lymphatic: No axillary or cervical lymphadenopathy  MUSCULOSKELETAL:  Range of motion of the left shoulder is deferred in the setting of known injury. She has questionable nerve sensation compared to the contralateral side. She has obvious swelling in the shoulder.  Imaging: X-rays, two views of the left shoulder are limited by patient cooperation and pain. However, there is a complex fracture of the proximal humerus with likely four parts. There's difficulty trying to assess the nature of the fracture secondary to the X-ray limitations.  BMI: Estimated body mass index is 24.96 kg/m as calculated from the following:   Height as of 08/13/23: 5' 5 (1.651 m).   Weight as of 08/13/23: 68 kg.  Lab Results  Component Value Date   ALBUMIN 4.2 02/07/2020   Diabetes: Patient does not have a diagnosis of diabetes.     Smoking Status:       Assessment: displaced fracture of upper end of humerus, LEFT  Plan: Plan for Procedure(s): ARTHROPLASTY, SHOULDER, TOTAL, REVERSE OPEN REDUCTION INTERNAL FIXATION (ORIF) PROXIMAL HUMERUS FRACTURE  The risks benefits and alternatives were discussed with the patient including but not limited to the risks of nonoperative treatment, versus surgical intervention including infection, bleeding, nerve injury,  blood clots,  cardiopulmonary complications, morbidity, mortality, among others, and they were willing to proceed.   We additionally specifically discussed risks of axillary nerve injury, infection, periprosthetic fracture, continued pain and longevity of implants prior to beginning procedure.    Patient will be closely monitored in PACU for  medical stabilization and pain control. If found stable in PACU, patient may be discharged home with outpatient follow-up. If any concerns regarding patient's stabilization patient will be admitted for observation after surgery. The patient is planning to be discharged home with outpatient PT.   The patient acknowledged the explanation, agreed to proceed with the plan and consent was signed.   Operative Plan: Left reverse total shoulder arthroplasty for fracture Discharge Medications: standarda DVT Prophylaxis: aspirin  Physical Therapy: delayed therapy Special Discharge needs: sling. [address]   Aleck LOISE Stalling, PA-C  09/02/2023 7:45 AM

## 2023-09-02 NOTE — Op Note (Signed)
 Orthopaedic Surgery Operative Note (CSN: [id number])  Sunjai Levandoski Falotico  Feb 23, 1951 Date of Surgery: 09/02/2023   Diagnoses:  Left proximal humerus displaced fracture  Procedure: Left reverse total Shoulder Arthroplasty Open reduction internal fixation of tuberosities   Operative Finding Successful completion of planned procedure.Displaced fracture, reasonable glenoid bone quality.  Post-operative plan: The patient will be NWB in sling.  The patient will be will be discharged from PACU if continues to be stable as was plan prior to surgery.  DVT prophylaxis Aspirin  81 mg twice daily for 6 weeks.  Pain control with PRN pain medication preferring oral medicines.  Follow up plan will be scheduled in approximately 7 days for incision check and XR.  Physical therapy to start after 4 weeks.  Implants:Tornier Perform humeral size 9S stem, +3 poly, 36 standard glenosphere, 25+3 baseplate, 35 center screw, 2 peripheral screws.  Post-Op Diagnosis: Same Surgeons:Primary: Cristy Bonner DASEN, MD Assistants:Angela Shona, RNFA Location: [facility] OR ROOM 11 Anesthesia: General with Exparel  Interscalene Antibiotics: Ancef  2g preop, Vancomycin  1000mg  locally Tourniquet time: None Estimated Blood Loss: 100 Complications: None Specimens: None Implants: * No implants in log *  Indications for Surgery:   ADALEI NOVELL is a 73 y.o. female with fall resulting in approximately wrist fracture with displacement.  Benefits and risks of operative and nonoperative management were discussed prior to surgery with patient/guardian(s) and informed consent form was completed.  Infection and need for further surgery were discussed as was prosthetic stability and cuff issues.  We additionally specifically discussed risks of axillary nerve injury, infection, periprosthetic fracture, continued pain and longevity of implants prior to beginning procedure.      Procedure:   The patient was identified in the preoperative holding area where the  surgical site was marked. Block placed by anesthesia with exparel .  The patient was taken to the OR where a procedural timeout was called and the above noted anesthesia was induced.  The patient was positioned beachchair on allen table with spider arm positioner.  Preoperative antibiotics were dosed.  The patient's left shoulder was prepped and draped in the usual sterile fashion.  A second preoperative timeout was called.       Standard deltopectoral approach was performed with a #10 blade. We dissected down to the subcutaneous tissues and the cephalic vein was taken laterally with the deltoid. Clavipectoral fascia was incised in line with the incision. Deep retractors were placed. The long of the biceps tendon was identified and there was significant tenosynovitis present.  Tenodesis was performed to the pectoralis tendon with #2 Ethibond. The remaining biceps was followed up into the rotator interval where it was released.    We used the bicipital groove as a landmark for the lesser and greater tuberosity fragments.  We were able to mobilize the lesser tuberosity fragment and placed stay sutures in the bone tendon junction to help with mobilization.  This point we were able to identify the  greater tuberosity fragment and 4 #5  FiberWire sutures were used to place into this for eventual repair of the tuberosities.  Once these were both mobilized we took care to identify the shaft fragment as well as the head fragment.  We carefully identified the head fragment were able to manually remove it.  At this point the axillary nerve was found and palpated and with a tug test noted to be intact.  Protected throughout the remainder of the case with blunt retractors.    We then released the SGHL with bovie cautery  prior to placing a curved mayo at the junction of the anterior glenoid well above the axillary nerve and bluntly dissecting the subscapularis from the capsule.  We then carefully protected the axillary  nerve as we gently released the inferior capsule to fully mobilize the subscapularis.  An anterior deltoid retractor was then placed as well as a small Hohmann retractor superiorly.   The glenoid was relatively preserved as we would expect in this fracture patient.  The remaining labrum was removed circumferentially taking great care not to disrupt the posterior capsule.    The glenoid drill guide was placed and used to drill a guide pin in the center, inferior position. The glenoid face was then reamed concentrically over the guide wire. The center hole was drilled over the guidepin in a near anatomic angle of version. Next the glenoid vault was drilled back and we placed 2 locking screws.  The base plate was screwed into the glenoid vault obtaining secure fixation. We next placed superior and inferior locking screws for additional fixation.  Next the above size glenosphere was selected and impacted onto the baseplate. The center screw was tightened.   We then repositioned the arm to give access to the humeral shaft fragment.  Drill holes were placed and fiberwire sutures in the shaft for vertical fixation of the tuberosities.  We broached with the Perform humeral fracture stem implants  up to size listed above which obtained an appropriate fit.    We tried multiple options for sizes settling on the above.  The shoulder was trialed.  There was good ROM in all planes and the shoulder was stable with no inferior translation.   We then mobilized her tuberosities again and placed the anterior deep limbs of the 4 #5 fiber wires around the stem.  1 of these was tied down fixing the greater tuberosity in place after bone graft harvest from the humeral head component was placed underneath.   The joint was reduced and thoroughly irrigated with pulsatile lavage. The remaining sutures were then placed through the subscapularis and the bone tendon junction and the tuberosities were reduced after bone graft placed  beneath as autograft at the subscap.  We horizontally secured the tuberosities before placing vertical fixation with the suture that was placed into the shaft.  Tuberosities moved as a unit were happy with the overall reduction.    We irrigated copiously at this point.  Hemostasis was obtained. The deltopectoral interval was reapproximated with #1 Ethibond. The subcutaneous tissues were closed with 3-0 Vicryl and the skin was closed with running nylon.

## 2023-09-02 NOTE — Discharge Instructions (Addendum)
 Bonner Hair MD, MPH Aleck Stalling, PA-C [facility] [address], [address] [phone] (tel)   [phone] (fax)   POST-OPERATIVE INSTRUCTIONS - TOTAL SHOULDER REPLACEMENT    WOUND CARE You may leave the operative dressing in place until your follow-up appointment. KEEP THE INCISIONS CLEAN AND DRY. There may be a small amount of fluid/bleeding leaking at the surgical site. This is normal after surgery.  If it fills with liquid or blood please call us  immediately to change it for you. Use the provided ice machine or Ice packs as often as possible for the first 3-4 days, then as needed for pain relief.   Keep a layer of cloth or a shirt between your skin and the cooling unit to prevent frost bite as it can get very cold.  SHOWERING: - You may shower on Post-Op Day #2.  - The dressing is water  resistant but do not scrub it as it may start to peel up.   - You may remove the sling for showering - Gently pat the area dry.  - Do not soak the shoulder in water .  - Do not go swimming in the pool or ocean until your incision has completely healed (about 4-6 weeks after surgery) - KEEP THE INCISIONS CLEAN AND DRY.  EXERCISES Wear the sling at all times  You may remove the sling for showering, but keep the arm across the chest or in a secondary sling.    Accidental/Purposeful External Rotation and shoulder flexion (reaching behind you) is to be avoided at all costs for the first month. It is ok to come out of your sling if your are sitting and have assistance for eating.   Do not lift anything heavier than 1 pound until we discuss it further in clinic.  It is normal for your fingers/hand to become more swollen after surgery and discolored from bruising.   This will resolve over the first few weeks usually after surgery. Please continue to ambulate and do not stay sitting or lying for too long.  Perform foot and wrist pumps to assist in circulation.  PHYSICAL  THERAPY - No therapy for 4 weeks  REGIONAL ANESTHESIA (NERVE BLOCKS) The anesthesia team may have performed a nerve block for you this is a great tool used to minimize pain.   The block may start wearing off overnight (between 8-24 hours postop) When the block wears off, your pain may go from nearly zero to the pain you would have had postop without the block. This is an abrupt transition but nothing dangerous is happening.   This can be a challenging period but utilize your as needed pain medications to try and manage this period. We suggest you use the pain medication the first night prior to going to bed, to ease this transition.  You may take an extra dose of narcotic when this happens if needed   POST-OP MEDICATIONS- Multimodal approach to pain control In general your pain will be controlled with a combination of substances.  Prescriptions unless otherwise discussed are electronically sent to your pharmacy.  This is a carefully made plan we use to minimize narcotic use.     Celebrex - Anti-inflammatory medication taken on a scheduled basis Acetaminophen  - Non-narcotic pain medicine taken on a scheduled basis  Oxycodone  - This is a strong narcotic, to be used only on an "as needed" basis for SEVERE pain. Aspirin  81mg  - This medicine is used to minimize the risk of blood clots after surgery.  Zofran  -  take as needed for nausea  *medications sent on 7/2*   FOLLOW-UP If you develop a Fever (>101.5), Redness or Drainage from the surgical incision site, please call our office to arrange for an evaluation. Please call the office to schedule a follow-up appointment for a wound check, 7-10 days post-operatively.  IF YOU HAVE ANY QUESTIONS, PLEASE FEEL FREE TO CALL OUR OFFICE.  HELPFUL INFORMATION  Your arm will be in a sling following surgery. You will be in this sling for the next 4 weeks.   You may be more comfortable sleeping in a semi-seated position the first few nights following  surgery.  Keep a pillow propped under the elbow and forearm for comfort.  If you have a recliner type of chair it might be beneficial.  If not that is fine too, but it would be helpful to sleep propped up with pillows behind your operated shoulder as well under your elbow and forearm.  This will reduce pulling on the suture lines.  When dressing, put your operative arm in the sleeve first.  When getting undressed, take your operative arm out last.  Loose fitting, button-down shirts are recommended.  In most states it is against the law to drive while your arm is in a sling. And certainly against the law to drive while taking narcotics.  You may return to work/school in the next couple of days when you feel up to it. Desk work and typing in the sling is fine.  We suggest you use the pain medication the first night prior to going to bed, in order to ease any pain when the anesthesia wears off. You should avoid taking pain medications on an empty stomach as it will make you nauseous.  You should wean off your narcotic medicines as soon as you are able.     Most patients will be off narcotics before their first postop appointment.   Do not drink alcoholic beverages or take illicit drugs when taking pain medications.  Pain medication may make you constipated.  Below are a few solutions to try in this order: Decrease the amount of pain medication if you aren't having pain. Drink lots of decaffeinated fluids. Drink prune juice and/or each dried prunes  If the first 3 don't work start with additional solutions Take Colace - an over-the-counter stool softener Take Senokot - an over-the-counter laxative Take Miralax  - a stronger over-the-counter laxative   Dental Antibiotics:  We require dental prophylaxis for 2 years after a shoulder replacement  Contact your surgeon for an antibiotic prescription, prior to your dental procedure.   For more information including helpful videos and documents  visit our website:   [email or web address]

## 2023-09-02 NOTE — Interval H&P Note (Signed)
 All questions answered, patient wants to proceed with procedure. ? ?

## 2023-09-02 NOTE — Transfer of Care (Signed)
 Immediate Anesthesia Transfer of Care Note  Patient: Michelle Johnston  Procedure(s) Performed: ARTHROPLASTY, SHOULDER, TOTAL, REVERSE (Left: Shoulder)  Patient Location: PACU  Anesthesia Type:General  Level of Consciousness: awake, alert , and oriented  Airway & Oxygen Therapy: Patient Spontanous Breathing and Patient connected to nasal cannula oxygen  Post-op Assessment: Report given to RN and Post -op Vital signs reviewed and stable  Post vital signs: Reviewed and stable  Last Vitals:  Vitals Value Taken Time  BP 136/61 09/02/23 12:00  Temp 36.6 C 09/02/23 12:00  Pulse 63 09/02/23 12:11  Resp 13 09/02/23 12:11  SpO2 92 % 09/02/23 12:11  Vitals shown include unfiled device data.  Last Pain:  Vitals:   09/02/23 1200  PainSc: 0-No pain         Complications: No notable events documented.

## 2023-09-02 NOTE — Anesthesia Procedure Notes (Signed)
 Anesthesia Regional Block: Interscalene brachial plexus block   Pre-Anesthetic Checklist: , timeout performed,  Correct Patient, Correct Site, Correct Laterality,  Correct Procedure, Correct Position, site marked,  Risks and benefits discussed,  Pre-op evaluation,  At surgeon's request and post-op pain management  Laterality: Left  Prep: Maximum Sterile Barrier Precautions used, chloraprep       Needles:  Injection technique: Single-shot  Needle Type: Echogenic Stimulator Needle     Needle Length: 5cm  Needle Gauge: 22     Additional Needles:   Procedures:,,,, ultrasound used (permanent image in chart),,    Narrative:  Start time: 09/02/2023 9:14 AM End time: 09/02/2023 9:24 AM Injection made incrementally with aspirations every 5 mL.  Performed by: Personally  Anesthesiologist: Epifanio Fallow, MD

## 2023-09-02 NOTE — Anesthesia Postprocedure Evaluation (Signed)
 Anesthesia Post Note  Patient: Michelle Johnston  Procedure(s) Performed: ARTHROPLASTY, SHOULDER, TOTAL, REVERSE (Left: Shoulder)     Patient location during evaluation: PACU Anesthesia Type: General and Regional Level of consciousness: awake and alert Pain management: pain level controlled Vital Signs Assessment: post-procedure vital signs reviewed and stable Respiratory status: spontaneous breathing, nonlabored ventilation and respiratory function stable Cardiovascular status: blood pressure returned to baseline and stable Postop Assessment: no apparent nausea or vomiting Anesthetic complications: no   There were no known notable events for this encounter.  Last Vitals:  Vitals:   09/02/23 1247 09/02/23 1300  BP: (!) 146/70 137/66  Pulse: 75 65  Resp: 15 14  Temp:  36.6 C  SpO2: 97% 95%    Last Pain:  Vitals:   09/02/23 1300  PainSc: 0-No pain                 Obert Espindola,W. EDMOND

## 2023-09-02 NOTE — Anesthesia Procedure Notes (Signed)
 Procedure Name: Intubation Date/Time: 09/02/2023 10:48 AM  Performed by: Scherrie Mast, CRNAPre-anesthesia Checklist: Patient identified, Emergency Drugs available, Suction available and Patient being monitored Patient Re-evaluated:Patient Re-evaluated prior to induction Oxygen Delivery Method: Circle System Utilized Preoxygenation: Pre-oxygenation with 100% oxygen Induction Type: IV induction Ventilation: Mask ventilation without difficulty Laryngoscope Size: Mac and 4 Grade View: Grade I Tube type: Oral Tube size: 7.0 mm Number of attempts: 1 Airway Equipment and Method: Stylet and Oral airway Placement Confirmation: ETT inserted through vocal cords under direct vision, positive ETCO2 and breath sounds checked- equal and bilateral Secured at: 21 cm Tube secured with: Tape Dental Injury: Teeth and Oropharynx as per pre-operative assessment

## 2023-09-07 ENCOUNTER — Encounter

## 2023-09-07 ENCOUNTER — Encounter (HOSPITAL_COMMUNITY): Payer: Self-pay | Admitting: Orthopaedic Surgery

## 2023-09-09 ENCOUNTER — Encounter

## 2023-09-10 ENCOUNTER — Other Ambulatory Visit: Payer: Self-pay

## 2023-09-10 ENCOUNTER — Emergency Department (HOSPITAL_COMMUNITY)

## 2023-09-10 ENCOUNTER — Encounter (HOSPITAL_COMMUNITY): Payer: Self-pay | Admitting: Emergency Medicine

## 2023-09-10 ENCOUNTER — Emergency Department (HOSPITAL_COMMUNITY)
Admission: EM | Admit: 2023-09-10 | Discharge: 2023-09-11 | Disposition: A | Discharge status: Home / Self Care | Source: Home / Self Care | Attending: Emergency Medicine | Admitting: Emergency Medicine

## 2023-09-10 DIAGNOSIS — E8809 Other disorders of plasma-protein metabolism, not elsewhere classified: Secondary | ICD-10-CM | POA: Diagnosis present

## 2023-09-10 DIAGNOSIS — Z96612 Presence of left artificial shoulder joint: Secondary | ICD-10-CM | POA: Diagnosis present

## 2023-09-10 DIAGNOSIS — H409 Unspecified glaucoma: Secondary | ICD-10-CM | POA: Diagnosis present

## 2023-09-10 DIAGNOSIS — E871 Hypo-osmolality and hyponatremia: Secondary | ICD-10-CM | POA: Insufficient documentation

## 2023-09-10 DIAGNOSIS — F419 Anxiety disorder, unspecified: Secondary | ICD-10-CM | POA: Diagnosis present

## 2023-09-10 DIAGNOSIS — I480 Paroxysmal atrial fibrillation: Secondary | ICD-10-CM | POA: Diagnosis present

## 2023-09-10 DIAGNOSIS — F32A Depression, unspecified: Secondary | ICD-10-CM | POA: Diagnosis present

## 2023-09-10 DIAGNOSIS — R42 Dizziness and giddiness: Secondary | ICD-10-CM | POA: Diagnosis present

## 2023-09-10 DIAGNOSIS — R7881 Bacteremia: Secondary | ICD-10-CM | POA: Diagnosis present

## 2023-09-10 DIAGNOSIS — Z471 Aftercare following joint replacement surgery: Secondary | ICD-10-CM | POA: Diagnosis not present

## 2023-09-10 DIAGNOSIS — R55 Syncope and collapse: Secondary | ICD-10-CM | POA: Diagnosis present

## 2023-09-10 DIAGNOSIS — G4489 Other headache syndrome: Secondary | ICD-10-CM | POA: Diagnosis not present

## 2023-09-10 DIAGNOSIS — R296 Repeated falls: Secondary | ICD-10-CM | POA: Diagnosis present

## 2023-09-10 DIAGNOSIS — E876 Hypokalemia: Secondary | ICD-10-CM | POA: Insufficient documentation

## 2023-09-10 DIAGNOSIS — I6523 Occlusion and stenosis of bilateral carotid arteries: Secondary | ICD-10-CM | POA: Diagnosis not present

## 2023-09-10 DIAGNOSIS — R519 Headache, unspecified: Secondary | ICD-10-CM | POA: Insufficient documentation

## 2023-09-10 DIAGNOSIS — K429 Umbilical hernia without obstruction or gangrene: Secondary | ICD-10-CM | POA: Diagnosis not present

## 2023-09-10 DIAGNOSIS — R410 Disorientation, unspecified: Secondary | ICD-10-CM | POA: Insufficient documentation

## 2023-09-10 DIAGNOSIS — R0600 Dyspnea, unspecified: Secondary | ICD-10-CM | POA: Diagnosis not present

## 2023-09-10 DIAGNOSIS — Z7982 Long term (current) use of aspirin: Secondary | ICD-10-CM | POA: Insufficient documentation

## 2023-09-10 DIAGNOSIS — Z0389 Encounter for observation for other suspected diseases and conditions ruled out: Secondary | ICD-10-CM | POA: Diagnosis not present

## 2023-09-10 DIAGNOSIS — K5730 Diverticulosis of large intestine without perforation or abscess without bleeding: Secondary | ICD-10-CM | POA: Diagnosis not present

## 2023-09-10 DIAGNOSIS — R9082 White matter disease, unspecified: Secondary | ICD-10-CM | POA: Diagnosis not present

## 2023-09-10 DIAGNOSIS — D539 Nutritional anemia, unspecified: Secondary | ICD-10-CM | POA: Diagnosis present

## 2023-09-10 DIAGNOSIS — I10 Essential (primary) hypertension: Secondary | ICD-10-CM | POA: Diagnosis present

## 2023-09-10 DIAGNOSIS — R110 Nausea: Secondary | ICD-10-CM | POA: Diagnosis not present

## 2023-09-10 DIAGNOSIS — Z96652 Presence of left artificial knee joint: Secondary | ICD-10-CM | POA: Diagnosis present

## 2023-09-10 DIAGNOSIS — R109 Unspecified abdominal pain: Secondary | ICD-10-CM | POA: Diagnosis not present

## 2023-09-10 DIAGNOSIS — Z1152 Encounter for screening for COVID-19: Secondary | ICD-10-CM | POA: Diagnosis not present

## 2023-09-10 DIAGNOSIS — S42302A Unspecified fracture of shaft of humerus, left arm, initial encounter for closed fracture: Secondary | ICD-10-CM | POA: Diagnosis not present

## 2023-09-10 DIAGNOSIS — E860 Dehydration: Secondary | ICD-10-CM | POA: Diagnosis present

## 2023-09-10 DIAGNOSIS — E872 Acidosis, unspecified: Secondary | ICD-10-CM | POA: Diagnosis present

## 2023-09-10 DIAGNOSIS — G4730 Sleep apnea, unspecified: Secondary | ICD-10-CM | POA: Diagnosis present

## 2023-09-10 DIAGNOSIS — Z9181 History of falling: Secondary | ICD-10-CM | POA: Diagnosis not present

## 2023-09-10 DIAGNOSIS — R29818 Other symptoms and signs involving the nervous system: Secondary | ICD-10-CM | POA: Diagnosis not present

## 2023-09-10 DIAGNOSIS — I517 Cardiomegaly: Secondary | ICD-10-CM | POA: Diagnosis not present

## 2023-09-10 DIAGNOSIS — J439 Emphysema, unspecified: Secondary | ICD-10-CM | POA: Diagnosis not present

## 2023-09-10 DIAGNOSIS — J320 Chronic maxillary sinusitis: Secondary | ICD-10-CM | POA: Diagnosis not present

## 2023-09-10 DIAGNOSIS — R197 Diarrhea, unspecified: Secondary | ICD-10-CM | POA: Diagnosis not present

## 2023-09-10 DIAGNOSIS — K219 Gastro-esophageal reflux disease without esophagitis: Secondary | ICD-10-CM | POA: Diagnosis present

## 2023-09-10 LAB — COMPREHENSIVE METABOLIC PANEL WITH GFR
ALT: 37 U/L (ref 0–44)
AST: 36 U/L (ref 15–41)
Albumin: 3 g/dL — ABNORMAL LOW (ref 3.5–5.0)
Alkaline Phosphatase: 70 U/L (ref 38–126)
Anion gap: 6 (ref 5–15)
BUN: 12 mg/dL (ref 8–23)
CO2: 28 mmol/L (ref 22–32)
Calcium: 9.1 mg/dL (ref 8.9–10.3)
Chloride: 97 mmol/L — ABNORMAL LOW (ref 98–111)
Creatinine, Ser: 0.66 mg/dL (ref 0.44–1.00)
GFR, Estimated: >60 mL/min (ref >60)
Glucose, Bld: 113 mg/dL — ABNORMAL HIGH (ref 70–99)
Potassium: 2.9 mmol/L — ABNORMAL LOW (ref 3.5–5.1)
Sodium: 131 mmol/L — ABNORMAL LOW (ref 135–145)
Total Bilirubin: 0.7 mg/dL (ref 0.0–1.2)
Total Protein: 6 g/dL — ABNORMAL LOW (ref 6.5–8.1)

## 2023-09-10 LAB — ETHANOL: Alcohol, Ethyl (B): <15 mg/dL (ref <15)

## 2023-09-10 LAB — RAPID URINE DRUG SCREEN, HOSP PERFORMED
Amphetamines: NOT DETECTED
Barbiturates: NOT DETECTED
Benzodiazepines: NOT DETECTED
Cocaine: NOT DETECTED
Opiates: NOT DETECTED
Tetrahydrocannabinol: POSITIVE — AB

## 2023-09-10 LAB — URINALYSIS, ROUTINE W REFLEX MICROSCOPIC
Bacteria, UA: NONE SEEN
Bilirubin Urine: NEGATIVE
Glucose, UA: NEGATIVE mg/dL
Hgb urine dipstick: NEGATIVE
Ketones, ur: 5 mg/dL — AB
Nitrite: NEGATIVE
Protein, ur: NEGATIVE mg/dL
Specific Gravity, Urine: 1.004 — ABNORMAL LOW (ref 1.005–1.030)
pH: 8 (ref 5.0–8.0)

## 2023-09-10 LAB — CBG MONITORING, ED: Glucose-Capillary: 110 mg/dL — ABNORMAL HIGH (ref 70–99)

## 2023-09-10 LAB — CBC
HCT: 29.1 % — ABNORMAL LOW (ref 36.0–46.0)
Hemoglobin: 10.3 g/dL — ABNORMAL LOW (ref 12.0–15.0)
MCH: 34.8 pg — ABNORMAL HIGH (ref 26.0–34.0)
MCHC: 35.4 g/dL (ref 30.0–36.0)
MCV: 98.3 fL (ref 80.0–100.0)
Platelets: 354 K/uL (ref 150–400)
RBC: 2.96 MIL/uL — ABNORMAL LOW (ref 3.87–5.11)
RDW: 12.3 % (ref 11.5–15.5)
WBC: 6.1 K/uL (ref 4.0–10.5)
nRBC: 0 % (ref 0.0–0.2)

## 2023-09-10 LAB — LIPASE, BLOOD: Lipase: 24 U/L (ref 11–51)

## 2023-09-10 LAB — MAGNESIUM: Magnesium: 1.7 mg/dL (ref 1.7–2.4)

## 2023-09-10 MED ORDER — POTASSIUM CHLORIDE IN NACL 20-0.9 MEQ/L-% IV SOLN
Freq: Once | INTRAVENOUS | Status: AC
  Filled 2023-09-10: qty 1000

## 2023-09-10 MED ORDER — SODIUM CHLORIDE 0.9 % IV BOLUS
1000.0000 mL | Freq: Once | INTRAVENOUS | Status: AC
  Administered 2023-09-10: 1000 mL via INTRAVENOUS

## 2023-09-10 NOTE — ED Triage Notes (Signed)
 Patient BIB [facility] c/o headache and dizziness x 2 days. Stroke screen negative.

## 2023-09-10 NOTE — ED Notes (Signed)
 Patient transported to CT

## 2023-09-10 NOTE — Discharge Instructions (Signed)
 As discussed, today's evaluation been generally reassuring.  Your CAT scan of your brain, and your labs were generally unremarkable.  There are some consideration that you are episode may have been due to your medications, and interactions.  Please be sure to follow-up closely with your physician and discuss today's evaluation and your medication list.  Return here for concerning changes in your condition.

## 2023-09-10 NOTE — ED Provider Notes (Signed)
 Agency EMERGENCY DEPARTMENT AT [facility] Provider Note   CSN: [id number] Arrival date & time: 09/10/23  1930     Patient presents with: Headache and Dizziness   Michelle Johnston is a 73 y.o. female.   HPI With her husband assists with the history.  She presents with concern of headache, dizziness, and has been reports that earlier in the day she was minimally interactive.  Of note patient recently had shoulder surgery is on narcotics. Currently patient denies any focal weakness, does not describe left shoulder pain, denies fever, confusion.     Prior to Admission medications   Medication Sig Start Date End Date Taking? Authorizing Provider  acetaminophen  (TYLENOL ) 500 MG tablet Take 1,000 mg by mouth every 6 (six) hours as needed for moderate pain (pain score 4-6).    [provider]  albuterol  (VENTOLIN  HFA) 108 (90 Base) MCG/ACT inhaler Inhale 1-2 puffs into the lungs every 6 (six) hours as needed for wheezing or shortness of breath.    [provider]  amLODipine  (NORVASC ) 10 MG tablet Take 10 mg by mouth at bedtime.    [provider]  aspirin  EC 81 MG tablet Take 1 tablet (81 mg total) by mouth daily. Swallow whole. 06/24/23   Lesia Ozell Barter, PA-C  b complex vitamins tablet Take 1 tablet by mouth daily.    [provider]  calcium  carbonate (OS-CAL - DOSED IN MG OF ELEMENTAL CALCIUM ) 1250 (500 Ca) MG tablet Take 1 tablet by mouth daily with breakfast.    [provider]  carvedilol  (COREG ) 6.25 MG tablet TAKE 1 TABLET BY MOUTH TWICE DAILY WITH A MEAL 02/18/23   Cindie Ole DASEN, MD  celecoxib (CELEBREX) 200 MG capsule Take 200 mg by mouth 2 (two) times daily. 09/01/23   [provider]  cetirizine (ZYRTEC) 10 MG tablet Take 10 mg by mouth at bedtime.    [provider]  Cholecalciferol  (VITAMIN D ) 50 MCG (2000 UT) tablet Take 2,000 Units by mouth daily.    [provider]  cyanocobalamin   (VITAMIN B12) 1000 MCG/ML injection Inject 1,000 mcg into the muscle every Sunday. 05/29/22   [provider]  FLUoxetine  (PROZAC ) 40 MG capsule Take 40 mg by mouth daily. 02/21/20   [provider]  gabapentin  (NEURONTIN ) 300 MG capsule Take 300-600 mg by mouth See admin instructions. Take 300 mg in the morning and 600 mg at night 04/19/20   [provider]  ipratropium (ATROVENT) 0.06 % nasal spray Place 1 spray into both nostrils daily as needed for rhinitis. 09/12/19   [provider]  latanoprost  (XALATAN ) 0.005 % ophthalmic solution Place 1 drop into both eyes every morning.     [provider]  methylphenidate  (RITALIN ) 10 MG tablet Take 5 mg by mouth daily. 12/15/21   [provider]  montelukast  (SINGULAIR ) 10 MG tablet Take 10 mg by mouth daily as needed (allergies).    [provider]  Multiple Vitamins-Minerals (MULTIVITAMIN WITH MINERALS) tablet Take 1 tablet by mouth daily.    [provider]  nitroGLYCERIN  (NITROSTAT ) 0.4 MG SL tablet Place 0.4 mg under the tongue every 5 (five) minutes as needed for chest pain.    [provider]  Omega-3 Fatty Acids (FISH OIL) 1000 MG CAPS Take 1,000 mg by mouth daily.    [provider]  omeprazole (PRILOSEC) 20 MG capsule Take 20 mg by mouth daily as needed (acid reflux). 12/14/19   [provider]  oxycodone  (OXY-IR) 5 MG capsule Take 5 mg by mouth every 6 (six) hours as needed for pain.    [provider]  rosuvastatin  (CRESTOR ) 20 MG tablet Take 1 tablet by mouth once daily 04/14/23   Kate Lonni CROME, MD  vitamin C  (ASCORBIC ACID ) 500 MG tablet Take 500 mg by mouth daily.    [provider]    Allergies: Dexlansoprazole, Flecainide , Nasal spray, Zetia  [ezetimibe ], Cefaclor, Ciprofibrate, Ciprofloxacin , and Metronidazole     Review of Systems  Updated Vital Signs BP 137/60 (BP Location: Right Arm)   Pulse 73   Temp 98.3  F (36.8 C) (Oral)   Resp 16   Ht 5' 5 (1.651 m)   Wt 67.6 kg   SpO2 100%   BMI 24.79 kg/m   Physical Exam Vitals and nursing note reviewed.  Constitutional:      General: She is not in acute distress.    Appearance: She is well-developed.  HENT:     Head: Normocephalic and atraumatic.  Eyes:     Conjunctiva/sclera: Conjunctivae normal.  Cardiovascular:     Rate and Rhythm: Normal rate and regular rhythm.  Pulmonary:     Effort: Pulmonary effort is normal. No respiratory distress.     Breath sounds: Normal breath sounds. No stridor.  Abdominal:     General: There is no distension.  Skin:    General: Skin is warm and dry.  Neurological:     Mental Status: She is alert and oriented to person, place, and time.     Cranial Nerves: No cranial nerve deficit.     Motor: No weakness.     Coordination: Coordination normal.  Psychiatric:        Mood and Affect: Mood normal.     (all labs ordered are listed, but only abnormal results are displayed) Labs Reviewed  COMPREHENSIVE METABOLIC PANEL WITH GFR - Abnormal; Notable for the following components:      Result Value   Sodium 131 (*)    Potassium 2.9 (*)    Chloride 97 (*)    Glucose, Bld 113 (*)    Total Protein 6.0 (*)    Albumin 3.0 (*)    All other components within normal limits  CBC - Abnormal; Notable for the following components:   RBC 2.96 (*)    Hemoglobin 10.3 (*)    HCT 29.1 (*)    MCH 34.8 (*)    All other components within normal limits  URINALYSIS, ROUTINE W REFLEX MICROSCOPIC - Abnormal; Notable for the following components:   Color, Urine STRAW (*)    Specific Gravity, Urine 1.004 (*)    Ketones, ur 5 (*)    Leukocytes,Ua TRACE (*)    All other components within normal limits  RAPID URINE DRUG SCREEN, HOSP PERFORMED - Abnormal; Notable for the following components:   Tetrahydrocannabinol POSITIVE (*)    All other components within normal limits  CBG MONITORING, ED - Abnormal; Notable for the  following components:   Glucose-Capillary 110 (*)    All other components within normal limits  LIPASE, BLOOD  ETHANOL  MAGNESIUM     EKG: EKG Interpretation Date/Time:  Friday September 10 2023 19:48:32 EDT Ventricular Rate:  69 PR Interval:  252 QRS Duration:  104 QT Interval:  448 QTC Calculation: 480 R Axis:   -32  Text Interpretation: Sinus rhythm with 1st degree A-V block Left axis deviation Incomplete right bundle branch block Artifact Abnormal ECG Confirmed by Garrick Charleston [phone]) on 09/10/2023 7:53:30  PM  Radiology: CT Head Wo Contrast Result Date: 09/10/2023 CLINICAL DATA:  Headache, dizziness, altered mental status. EXAM: CT HEAD WITHOUT CONTRAST TECHNIQUE: Contiguous axial images were obtained from the base of the skull through the vertex without intravenous contrast. RADIATION DOSE REDUCTION: This exam was performed according to the departmental dose-optimization program which includes automated exposure control, adjustment of the mA and/or kV according to patient size and/or use of iterative reconstruction technique. COMPARISON:  07/01/2023 FINDINGS: Brain: The brainstem, cerebellum, cerebral peduncles, thalami, basal ganglia, basilar cisterns, and ventricular system appear within normal limits. No intracranial hemorrhage, mass lesion, or acute CVA. Vascular: There is atherosclerotic calcification of the cavernous carotid arteries bilaterally. 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 Other: Stable subcutaneous lesion in the left posterior parietal scalp on image 29 series 2, probably a dense a basis cyst or similar benign lesion. No change from prior. IMPRESSION: 1. No acute intracranial findings. 2. Mild chronic right posterior ethmoid sinusitis. 3. Prior left maxillary antrostomy. 4. Focal calcification along the right optic  disc compatible with small optic drusen. 5. Stable subcutaneous lesion in the left posterior parietal scalp, probably a dense sebaceous cyst or similar benign lesion. Electronically Signed   By: Ryan Salvage M.D.   On: 09/10/2023 21:13     Procedures   Medications Ordered in the ED  0.9 % NaCl with KCl 20 mEq/ L  infusion ( Intravenous New Bag/Given 09/10/23 2211)  sodium chloride  0.9 % bolus 1,000 mL (0 mLs Intravenous Stopped 09/10/23 2209)                                    Medical Decision Making Adult female presents after episode of weakness, minimal responsiveness, now with dizziness and headache.  Broad differential including dehydration, infection, intracranial abnormality, stroke, medication effects, of particular concern as the patient is currently using meds for recent shoulder surgery. Cardiac 75 sinus normal pulse ox 100% room air normal  Amount and/or Complexity of Data Reviewed Independent Historian: spouse External Data Reviewed: notes. Labs: ordered. Decision-making details documented in ED Course. Radiology: ordered and independent interpretation performed. Decision-making details documented in ED Course. ECG/medicine tests: ordered and independent interpretation performed. Decision-making details documented in ED Course.  Risk Prescription drug management.   10:48 PM Patient much more awake, alert, receiving fluids.  I reviewed the CT, discussed with her and her husband.  Labs reassuring, CT reassuring, no evidence for stroke and she is interacting much more normal manner. We discussed possibilities as above, with consideration of medication interactions primary as she is currently using pain medications and drug screen was positive for THC.  Given improvement here, reassuring findings, patient will be discharged to follow-up closely with primary care.     Final diagnoses:  Bad headache  Delirium    ED Discharge Orders     None           Garrick Charleston, MD 09/10/23 2248

## 2023-09-11 ENCOUNTER — Encounter (HOSPITAL_COMMUNITY): Payer: Self-pay | Admitting: Internal Medicine

## 2023-09-11 ENCOUNTER — Inpatient Hospital Stay (HOSPITAL_COMMUNITY)
Admission: EM | Admit: 2023-09-11 | Discharge: 2023-09-15 | DRG: 872 | Disposition: A | Discharge status: Home Health | Attending: Internal Medicine | Admitting: Internal Medicine

## 2023-09-11 ENCOUNTER — Emergency Department (HOSPITAL_COMMUNITY)

## 2023-09-11 ENCOUNTER — Observation Stay (HOSPITAL_COMMUNITY)

## 2023-09-11 DIAGNOSIS — E872 Acidosis, unspecified: Secondary | ICD-10-CM | POA: Diagnosis present

## 2023-09-11 DIAGNOSIS — Z801 Family history of malignant neoplasm of trachea, bronchus and lung: Secondary | ICD-10-CM

## 2023-09-11 DIAGNOSIS — Z807 Family history of other malignant neoplasms of lymphoid, hematopoietic and related tissues: Secondary | ICD-10-CM

## 2023-09-11 DIAGNOSIS — Z79899 Other long term (current) drug therapy: Secondary | ICD-10-CM

## 2023-09-11 DIAGNOSIS — I10 Essential (primary) hypertension: Secondary | ICD-10-CM | POA: Diagnosis present

## 2023-09-11 DIAGNOSIS — F32A Depression, unspecified: Secondary | ICD-10-CM | POA: Diagnosis present

## 2023-09-11 DIAGNOSIS — F419 Anxiety disorder, unspecified: Secondary | ICD-10-CM | POA: Diagnosis present

## 2023-09-11 DIAGNOSIS — Z881 Allergy status to other antibiotic agents status: Secondary | ICD-10-CM

## 2023-09-11 DIAGNOSIS — E876 Hypokalemia: Secondary | ICD-10-CM | POA: Diagnosis present

## 2023-09-11 DIAGNOSIS — Z1152 Encounter for screening for COVID-19: Secondary | ICD-10-CM

## 2023-09-11 DIAGNOSIS — E860 Dehydration: Principal | ICD-10-CM | POA: Diagnosis present

## 2023-09-11 DIAGNOSIS — R55 Syncope and collapse: Secondary | ICD-10-CM | POA: Diagnosis present

## 2023-09-11 DIAGNOSIS — R197 Diarrhea, unspecified: Secondary | ICD-10-CM | POA: Diagnosis not present

## 2023-09-11 DIAGNOSIS — I480 Paroxysmal atrial fibrillation: Secondary | ICD-10-CM | POA: Diagnosis present

## 2023-09-11 DIAGNOSIS — Z8249 Family history of ischemic heart disease and other diseases of the circulatory system: Secondary | ICD-10-CM

## 2023-09-11 DIAGNOSIS — J320 Chronic maxillary sinusitis: Secondary | ICD-10-CM | POA: Diagnosis present

## 2023-09-11 DIAGNOSIS — R42 Dizziness and giddiness: Secondary | ICD-10-CM | POA: Diagnosis not present

## 2023-09-11 DIAGNOSIS — B9689 Other specified bacterial agents as the cause of diseases classified elsewhere: Secondary | ICD-10-CM | POA: Diagnosis present

## 2023-09-11 DIAGNOSIS — Z96612 Presence of left artificial shoulder joint: Secondary | ICD-10-CM | POA: Diagnosis present

## 2023-09-11 DIAGNOSIS — Z83438 Family history of other disorder of lipoprotein metabolism and other lipidemia: Secondary | ICD-10-CM

## 2023-09-11 DIAGNOSIS — Z471 Aftercare following joint replacement surgery: Secondary | ICD-10-CM | POA: Diagnosis not present

## 2023-09-11 DIAGNOSIS — E8809 Other disorders of plasma-protein metabolism, not elsewhere classified: Secondary | ICD-10-CM | POA: Diagnosis present

## 2023-09-11 DIAGNOSIS — R296 Repeated falls: Secondary | ICD-10-CM | POA: Diagnosis present

## 2023-09-11 DIAGNOSIS — R29818 Other symptoms and signs involving the nervous system: Secondary | ICD-10-CM | POA: Diagnosis not present

## 2023-09-11 DIAGNOSIS — H409 Unspecified glaucoma: Secondary | ICD-10-CM | POA: Diagnosis present

## 2023-09-11 DIAGNOSIS — R9082 White matter disease, unspecified: Secondary | ICD-10-CM | POA: Diagnosis not present

## 2023-09-11 DIAGNOSIS — Z96652 Presence of left artificial knee joint: Secondary | ICD-10-CM | POA: Diagnosis present

## 2023-09-11 DIAGNOSIS — G4730 Sleep apnea, unspecified: Secondary | ICD-10-CM | POA: Diagnosis present

## 2023-09-11 DIAGNOSIS — R0600 Dyspnea, unspecified: Secondary | ICD-10-CM

## 2023-09-11 DIAGNOSIS — Z7982 Long term (current) use of aspirin: Secondary | ICD-10-CM

## 2023-09-11 DIAGNOSIS — K219 Gastro-esophageal reflux disease without esophagitis: Secondary | ICD-10-CM | POA: Diagnosis present

## 2023-09-11 DIAGNOSIS — Z87891 Personal history of nicotine dependence: Secondary | ICD-10-CM

## 2023-09-11 DIAGNOSIS — J439 Emphysema, unspecified: Secondary | ICD-10-CM | POA: Diagnosis present

## 2023-09-11 DIAGNOSIS — D539 Nutritional anemia, unspecified: Secondary | ICD-10-CM | POA: Diagnosis present

## 2023-09-11 DIAGNOSIS — Z9181 History of falling: Secondary | ICD-10-CM

## 2023-09-11 DIAGNOSIS — R7881 Bacteremia: Principal | ICD-10-CM | POA: Diagnosis present

## 2023-09-11 DIAGNOSIS — E871 Hypo-osmolality and hyponatremia: Secondary | ICD-10-CM | POA: Diagnosis present

## 2023-09-11 DIAGNOSIS — Z888 Allergy status to other drugs, medicaments and biological substances status: Secondary | ICD-10-CM

## 2023-09-11 LAB — URINALYSIS, W/ REFLEX TO CULTURE (INFECTION SUSPECTED)
Bacteria, UA: NONE SEEN
Bilirubin Urine: NEGATIVE
Glucose, UA: NEGATIVE mg/dL
Hgb urine dipstick: NEGATIVE
Ketones, ur: 20 mg/dL — AB
Leukocytes,Ua: NEGATIVE
Nitrite: NEGATIVE
Protein, ur: NEGATIVE mg/dL
Specific Gravity, Urine: 1.008 (ref 1.005–1.030)
pH: 9 — ABNORMAL HIGH (ref 5.0–8.0)

## 2023-09-11 LAB — PHOSPHORUS: Phosphorus: 3.2 mg/dL (ref 2.5–4.6)

## 2023-09-11 LAB — COMPREHENSIVE METABOLIC PANEL WITH GFR
ALT: 37 U/L (ref 0–44)
AST: 33 U/L (ref 15–41)
Albumin: 3.2 g/dL — ABNORMAL LOW (ref 3.5–5.0)
Alkaline Phosphatase: 73 U/L (ref 38–126)
Anion gap: 17 — ABNORMAL HIGH (ref 5–15)
BUN: 6 mg/dL — ABNORMAL LOW (ref 8–23)
CO2: 17 mmol/L — ABNORMAL LOW (ref 22–32)
Calcium: 9.1 mg/dL (ref 8.9–10.3)
Chloride: 98 mmol/L (ref 98–111)
Creatinine, Ser: 0.46 mg/dL (ref 0.44–1.00)
GFR, Estimated: >60 mL/min (ref >60)
Glucose, Bld: 116 mg/dL — ABNORMAL HIGH (ref 70–99)
Potassium: 2.9 mmol/L — ABNORMAL LOW (ref 3.5–5.1)
Sodium: 132 mmol/L — ABNORMAL LOW (ref 135–145)
Total Bilirubin: 0.9 mg/dL (ref 0.0–1.2)
Total Protein: 6.3 g/dL — ABNORMAL LOW (ref 6.5–8.1)

## 2023-09-11 LAB — MAGNESIUM: Magnesium: 1.8 mg/dL (ref 1.7–2.4)

## 2023-09-11 LAB — CBC WITH DIFFERENTIAL/PLATELET
Abs Immature Granulocytes: 0.03 K/uL (ref 0.00–0.07)
Basophils Absolute: 0 K/uL (ref 0.0–0.1)
Basophils Relative: 1 %
Eosinophils Absolute: 0 K/uL (ref 0.0–0.5)
Eosinophils Relative: 1 %
HCT: 29.7 % — ABNORMAL LOW (ref 36.0–46.0)
Hemoglobin: 10.4 g/dL — ABNORMAL LOW (ref 12.0–15.0)
Immature Granulocytes: 1 %
Lymphocytes Relative: 9 %
Lymphs Abs: 0.6 K/uL — ABNORMAL LOW (ref 0.7–4.0)
MCH: 34.9 pg — ABNORMAL HIGH (ref 26.0–34.0)
MCHC: 35 g/dL (ref 30.0–36.0)
MCV: 99.7 fL (ref 80.0–100.0)
Monocytes Absolute: 0.6 K/uL (ref 0.1–1.0)
Monocytes Relative: 9 %
Neutro Abs: 5.3 K/uL (ref 1.7–7.7)
Neutrophils Relative %: 79 %
Platelets: 373 K/uL (ref 150–400)
RBC: 2.98 MIL/uL — ABNORMAL LOW (ref 3.87–5.11)
RDW: 12.8 % (ref 11.5–15.5)
WBC: 6.5 K/uL (ref 4.0–10.5)
nRBC: 0 % (ref 0.0–0.2)

## 2023-09-11 LAB — BRAIN NATRIURETIC PEPTIDE: B Natriuretic Peptide: 262.3 pg/mL — ABNORMAL HIGH (ref 0.0–100.0)

## 2023-09-11 LAB — RESP PANEL BY RT-PCR (RSV, FLU A&B, COVID)  RVPGX2
Influenza A by PCR: NEGATIVE
Influenza B by PCR: NEGATIVE
Resp Syncytial Virus by PCR: NEGATIVE
SARS Coronavirus 2 by RT PCR: NEGATIVE

## 2023-09-11 LAB — D-DIMER, QUANTITATIVE: D-Dimer, Quant: 2.43 ug{FEU}/mL — ABNORMAL HIGH (ref 0.00–0.50)

## 2023-09-11 LAB — TROPONIN I (HIGH SENSITIVITY)
Troponin I (High Sensitivity): 12 ng/L (ref <18)
Troponin I (High Sensitivity): 11 ng/L (ref <18)

## 2023-09-11 LAB — PROTIME-INR
INR: 1 (ref 0.8–1.2)
Prothrombin Time: 13.5 s (ref 11.4–15.2)

## 2023-09-11 LAB — I-STAT CG4 LACTIC ACID, ED: Lactic Acid, Venous: 1.6 mmol/L (ref 0.5–1.9)

## 2023-09-11 MED ORDER — B COMPLEX-C PO TABS
1.0000 | ORAL_TABLET | Freq: Every day | ORAL | Status: DC
  Administered 2023-09-11 – 2023-09-15 (×5): 1 via ORAL
  Filled 2023-09-11 (×5): qty 1

## 2023-09-11 MED ORDER — POTASSIUM CHLORIDE 10 MEQ/100ML IV SOLN
10.0000 meq | INTRAVENOUS | Status: DC
  Administered 2023-09-11: 10 meq via INTRAVENOUS
  Filled 2023-09-11: qty 100

## 2023-09-11 MED ORDER — MONTELUKAST SODIUM 10 MG PO TABS: 10.0000 mg | ORAL_TABLET | Freq: Every day | ORAL | Status: DC | PRN

## 2023-09-11 MED ORDER — GABAPENTIN 300 MG PO CAPS: 300.0000 mg | ORAL_CAPSULE | ORAL | Status: DC

## 2023-09-11 MED ORDER — IOHEXOL 350 MG/ML SOLN
75.0000 mL | Freq: Once | INTRAVENOUS | Status: AC | PRN
  Administered 2023-09-11: 75 mL via INTRAVENOUS

## 2023-09-11 MED ORDER — HYDROMORPHONE HCL 1 MG/ML IJ SOLN: 0.5000 mg | INTRAMUSCULAR | Status: DC | PRN

## 2023-09-11 MED ORDER — METHYLPHENIDATE HCL 5 MG PO TABS
5.0000 mg | ORAL_TABLET | Freq: Every day | ORAL | Status: DC
  Administered 2023-09-12 – 2023-09-15 (×4): 5 mg via ORAL
  Filled 2023-09-11 (×4): qty 1

## 2023-09-11 MED ORDER — CALCIUM CARBONATE 1250 (500 CA) MG PO TABS
1.0000 | ORAL_TABLET | Freq: Every day | ORAL | Status: DC
  Administered 2023-09-12 – 2023-09-15 (×4): 1250 mg via ORAL
  Filled 2023-09-11 (×4): qty 1

## 2023-09-11 MED ORDER — OMEGA-3-ACID ETHYL ESTERS 1 G PO CAPS
1.0000 g | ORAL_CAPSULE | Freq: Every day | ORAL | Status: DC
  Administered 2023-09-11 – 2023-09-15 (×5): 1 g via ORAL
  Filled 2023-09-11 (×5): qty 1

## 2023-09-11 MED ORDER — LACTATED RINGERS IV SOLN: INTRAVENOUS | Status: DC

## 2023-09-11 MED ORDER — ENSURE PLUS HIGH PROTEIN PO LIQD
237.0000 mL | Freq: Two times a day (BID) | ORAL | Status: DC
  Administered 2023-09-12 – 2023-09-15 (×2): 237 mL via ORAL

## 2023-09-11 MED ORDER — VITAMIN C 500 MG PO TABS
500.0000 mg | ORAL_TABLET | Freq: Every day | ORAL | Status: DC
Start: 2023-09-11 — End: 2023-09-15
  Administered 2023-09-11 – 2023-09-15 (×5): 500 mg via ORAL
  Filled 2023-09-11 (×5): qty 1

## 2023-09-11 MED ORDER — CARVEDILOL 6.25 MG PO TABS
6.2500 mg | ORAL_TABLET | Freq: Two times a day (BID) | ORAL | Status: DC
  Administered 2023-09-12 – 2023-09-15 (×7): 6.25 mg via ORAL
  Filled 2023-09-11 (×7): qty 1

## 2023-09-11 MED ORDER — VITAMIN D 25 MCG (1000 UNIT) PO TABS
2000.0000 [IU] | ORAL_TABLET | Freq: Every day | ORAL | Status: DC
  Administered 2023-09-11 – 2023-09-15 (×5): 2000 [IU] via ORAL
  Filled 2023-09-11 (×7): qty 2

## 2023-09-11 MED ORDER — HYDRALAZINE HCL 20 MG/ML IJ SOLN: 10.0000 mg | Freq: Four times a day (QID) | INTRAMUSCULAR | Status: DC | PRN

## 2023-09-11 MED ORDER — ENOXAPARIN SODIUM 40 MG/0.4ML IJ SOSY
40.0000 mg | PREFILLED_SYRINGE | INTRAMUSCULAR | Status: DC
  Administered 2023-09-11 – 2023-09-14 (×4): 40 mg via SUBCUTANEOUS
  Filled 2023-09-11 (×4): qty 0.4

## 2023-09-11 MED ORDER — ROSUVASTATIN CALCIUM 20 MG PO TABS
20.0000 mg | ORAL_TABLET | Freq: Every day | ORAL | Status: DC
  Administered 2023-09-11 – 2023-09-15 (×5): 20 mg via ORAL
  Filled 2023-09-11 (×5): qty 1

## 2023-09-11 MED ORDER — B COMPLEX PO TABS: 1.0000 | ORAL_TABLET | Freq: Every day | ORAL | Status: DC

## 2023-09-11 MED ORDER — POTASSIUM CHLORIDE IN NACL 40-0.9 MEQ/L-% IV SOLN
INTRAVENOUS | Status: AC
  Filled 2023-09-11 (×2): qty 1000

## 2023-09-11 MED ORDER — LORATADINE 10 MG PO TABS
10.0000 mg | ORAL_TABLET | Freq: Every day | ORAL | Status: DC
  Administered 2023-09-11 – 2023-09-15 (×5): 10 mg via ORAL
  Filled 2023-09-11 (×5): qty 1

## 2023-09-11 MED ORDER — MAGNESIUM SULFATE 2 GM/50ML IV SOLN
2.0000 g | Freq: Once | INTRAVENOUS | Status: AC
  Administered 2023-09-11: 2 g via INTRAVENOUS
  Filled 2023-09-11: qty 50

## 2023-09-11 MED ORDER — CYANOCOBALAMIN 1000 MCG/ML IJ SOLN
1000.0000 ug | INTRAMUSCULAR | Status: DC
  Administered 2023-09-12: 1000 ug via INTRAMUSCULAR
  Filled 2023-09-11: qty 1

## 2023-09-11 MED ORDER — POTASSIUM CHLORIDE 10 MEQ/100ML IV SOLN
10.0000 meq | Freq: Once | INTRAVENOUS | Status: AC
  Administered 2023-09-11: 10 meq via INTRAVENOUS
  Filled 2023-09-11: qty 100

## 2023-09-11 MED ORDER — ACETAMINOPHEN 650 MG RE SUPP
650.0000 mg | Freq: Four times a day (QID) | RECTAL | Status: DC | PRN
Start: 2023-09-11 — End: 2023-09-15

## 2023-09-11 MED ORDER — ACETAMINOPHEN 325 MG PO TABS
650.0000 mg | ORAL_TABLET | Freq: Four times a day (QID) | ORAL | Status: DC | PRN
  Administered 2023-09-15: 650 mg via ORAL
  Filled 2023-09-11: qty 2

## 2023-09-11 MED ORDER — LEVALBUTEROL HCL 0.63 MG/3ML IN NEBU: 0.6300 mg | INHALATION_SOLUTION | Freq: Four times a day (QID) | RESPIRATORY_TRACT | Status: DC | PRN

## 2023-09-11 MED ORDER — ADULT MULTIVITAMIN W/MINERALS CH
1.0000 | ORAL_TABLET | Freq: Every day | ORAL | Status: DC
  Administered 2023-09-11 – 2023-09-15 (×5): 1 via ORAL
  Filled 2023-09-11 (×7): qty 1

## 2023-09-11 MED ORDER — GABAPENTIN 300 MG PO CAPS
300.0000 mg | ORAL_CAPSULE | Freq: Every day | ORAL | Status: DC
  Administered 2023-09-12 – 2023-09-15 (×4): 300 mg via ORAL
  Filled 2023-09-11 (×4): qty 1

## 2023-09-11 MED ORDER — POLYETHYLENE GLYCOL 3350 17 G PO PACK: 17.0000 g | PACK | Freq: Every day | ORAL | Status: DC | PRN

## 2023-09-11 MED ORDER — POTASSIUM CHLORIDE CRYS ER 20 MEQ PO TBCR
40.0000 meq | EXTENDED_RELEASE_TABLET | Freq: Once | ORAL | Status: AC
  Administered 2023-09-11: 40 meq via ORAL
  Filled 2023-09-11: qty 2

## 2023-09-11 MED ORDER — PANTOPRAZOLE SODIUM 40 MG PO TBEC
40.0000 mg | DELAYED_RELEASE_TABLET | Freq: Every day | ORAL | Status: DC
  Administered 2023-09-11 – 2023-09-15 (×5): 40 mg via ORAL
  Filled 2023-09-11 (×5): qty 1

## 2023-09-11 MED ORDER — LATANOPROST 0.005 % OP SOLN
1.0000 [drp] | OPHTHALMIC | Status: DC
  Administered 2023-09-12 – 2023-09-15 (×3): 1 [drp] via OPHTHALMIC
  Filled 2023-09-11 (×2): qty 2.5

## 2023-09-11 MED ORDER — GABAPENTIN 300 MG PO CAPS
600.0000 mg | ORAL_CAPSULE | Freq: Every day | ORAL | Status: DC
  Administered 2023-09-11 – 2023-09-14 (×4): 600 mg via ORAL
  Filled 2023-09-11 (×4): qty 2

## 2023-09-11 MED ORDER — BISACODYL 10 MG RE SUPP: 10.0000 mg | Freq: Every day | RECTAL | Status: DC | PRN

## 2023-09-11 MED ORDER — ONDANSETRON HCL 4 MG PO TABS
4.0000 mg | ORAL_TABLET | Freq: Four times a day (QID) | ORAL | Status: DC | PRN
  Administered 2023-09-12: 4 mg via ORAL
  Filled 2023-09-11: qty 1

## 2023-09-11 MED ORDER — ONDANSETRON HCL 4 MG/2ML IJ SOLN
4.0000 mg | Freq: Four times a day (QID) | INTRAMUSCULAR | Status: DC | PRN
  Administered 2023-09-13: 4 mg via INTRAVENOUS
  Filled 2023-09-11: qty 2

## 2023-09-11 MED ORDER — OXYCODONE HCL 5 MG PO TABS
5.0000 mg | ORAL_TABLET | ORAL | Status: DC | PRN
  Administered 2023-09-12 – 2023-09-15 (×5): 5 mg via ORAL
  Filled 2023-09-11 (×5): qty 1

## 2023-09-11 MED ORDER — ASPIRIN 81 MG PO TBEC
81.0000 mg | DELAYED_RELEASE_TABLET | Freq: Every day | ORAL | Status: DC
  Administered 2023-09-11 – 2023-09-15 (×5): 81 mg via ORAL
  Filled 2023-09-11 (×5): qty 1

## 2023-09-11 MED ORDER — FLUOXETINE HCL 20 MG PO CAPS
40.0000 mg | ORAL_CAPSULE | Freq: Every day | ORAL | Status: DC
  Administered 2023-09-11 – 2023-09-15 (×5): 40 mg via ORAL
  Filled 2023-09-11 (×7): qty 2

## 2023-09-11 MED ORDER — AMLODIPINE BESYLATE 10 MG PO TABS
10.0000 mg | ORAL_TABLET | Freq: Every day | ORAL | Status: DC
  Administered 2023-09-11 – 2023-09-14 (×4): 10 mg via ORAL
  Filled 2023-09-11 (×4): qty 1

## 2023-09-11 MED ORDER — NITROGLYCERIN 0.4 MG SL SUBL: 0.4000 mg | SUBLINGUAL_TABLET | SUBLINGUAL | Status: DC | PRN

## 2023-09-11 MED ORDER — LACTATED RINGERS IV BOLUS
500.0000 mL | Freq: Once | INTRAVENOUS | Status: AC
  Administered 2023-09-11: 500 mL via INTRAVENOUS

## 2023-09-11 MED ORDER — GUAIFENESIN ER 600 MG PO TB12
600.0000 mg | ORAL_TABLET | Freq: Two times a day (BID) | ORAL | Status: DC
  Administered 2023-09-12 – 2023-09-14 (×2): 600 mg via ORAL
  Filled 2023-09-11 (×8): qty 1

## 2023-09-11 NOTE — H&P (Signed)
 History and Physical    Patient: Michelle Johnston FMW:[id number] DOB: 05/29/1950 DOA: 09/11/2023 DOS: the patient was seen and examined on 09/11/2023 PCP: Loreli Kins, MD  Patient coming from: Home  Chief Complaint:  Chief Complaint  Patient presents with   Dizziness   HPI: Michelle Johnston is a 73 y.o. female with medical history significant of significant for but not limited to history of atrial fibrillation status post ablation and Watchman, depression, anxiety, GERD, essential hypertension, sleep apnea noncompliant with CPAP, history of bradycardia, glaucoma and other comorbidities who presented to the hospital with dizziness and dyspnea.  Patient states that she was dizzy a few days ago and presented to the ED yesterday and given IV fluids and discharged home.  Subsequently she woke up this morning and was persistently dizzy and felt as if she is going to pass out but did not actually pass out.  Had a fall a few weeks ago and had to have shoulder surgery last week.  Since her surgery her appetite has not been the best and symptoms have started worsening.  This morning when she woke up she felt dizzy but also had associated dyspnea.  States that since her shoulder surgery last week her appetite has not been the best but she has been eating.  States that she may have had 1 episode of vomiting but no real nausea.  States that she also had some diarrhea for about a day.  Denies any chest discomfort but did state that she feels dizzy still little bit as and had some dyspnea.  She denied any burning or discomfort in her urine or any other symptoms.  TRH was asked to admit this patient for further evaluation of her dizziness and presyncopal episode along with her associated dyspnea.  Review of Systems: As mentioned in the history of present illness. All other systems reviewed and are negative.  Past Medical History:  Diagnosis Date   Anxiety    Arthritis    Atrial fibrillation (HCC)    Bradycardia     Bronchitis    Depression    Dyspnea    GERD (gastroesophageal reflux disease)    Glaucoma    Hypertension    Presence of Watchman left atrial appendage closure device 08/07/2021   Watchman FLX 27mm with Dr. Cindie   Seasonal allergies    Sleep apnea    does not use CPAP   Wears glasses    Past Surgical History:  Procedure Laterality Date   ABDOMINAL HYSTERECTOMY  1990   ATRIAL FIBRILLATION ABLATION N/A 08/12/2020   Procedure: ATRIAL FIBRILLATION ABLATION;  Surgeon: Cindie Ole DASEN, MD;  [facility];  Service: Cardiovascular;  Laterality: N/A;   ATRIAL FIBRILLATION ABLATION N/A 03/26/2023   Procedure: ATRIAL FIBRILLATION ABLATION;  Surgeon: Cindie Ole DASEN, MD;  Location: [facility];  Service: Cardiovascular;  Laterality: N/A;   BREAST BIOPSY Right    benign   CARDIOVERSION N/A 08/15/2019   Procedure: CARDIOVERSION;  Surgeon: Elmira Newman PARAS, MD;  Location: [facility];  Service: Cardiovascular;  Laterality: N/A;   COLONOSCOPY     LEFT ATRIAL APPENDAGE OCCLUSION N/A 08/07/2021   Procedure: LEFT ATRIAL APPENDAGE OCCLUSION;  Surgeon: Cindie Ole DASEN, MD;  [facility]: [facility];  Service: Cardiovascular;  Laterality: N/A;   meniscus tear knee     left knee   NASAL SINUS SURGERY  1975   ORIF ANKLE FRACTURE  2009   left   ORIF HUMERUS FRACTURE Right  07/15/2023   Procedure: OPEN REDUCTION INTERNAL FIXATION (ORIF) DISTAL HUMERUS FRACTURE;  Surgeon: Cristy Bonner DASEN, MD;  Location: [facility];  Service: Orthopedics;  Laterality: Right;   REVERSE SHOULDER ARTHROPLASTY Left 09/02/2023   Procedure: ARTHROPLASTY, SHOULDER, TOTAL, REVERSE;  Surgeon: Cristy Bonner DASEN, MD;  Location: [facility];  Service: Orthopedics;  Laterality: Left;   TEE WITHOUT CARDIOVERSION N/A 08/07/2021   Procedure: TRANSESOPHAGEAL ECHOCARDIOGRAM (TEE);  Surgeon: Cindie Ole DASEN, MD;  [facility]: [facility] CV [facility];  Service: Cardiovascular;  Laterality: N/A;   TOTAL KNEE  ARTHROPLASTY Left 02/03/2019   Procedure: LEFT TOTAL KNEE ARTHROPLASTY;  Surgeon: Vernetta Lonni GRADE, MD;  Location: [facility];  Service: Orthopedics;  Laterality: Left;   TURBINATE REDUCTION Bilateral 06/04/2014   Procedure: BILATERAL TURBINATE REDUCTION;  Surgeon: Daniel Moccasin, MD;  Location:  SURGERY CENTER;  Service: ENT;  Laterality: Bilateral;   TYMPANOSTOMY TUBE PLACEMENT     left   Social History:  reports that she quit smoking about 25 years ago. Her smoking use included cigarettes. She started smoking about 55 years ago. She has a 60 pack-year smoking history. She has never used smokeless tobacco. She reports current alcohol use of about 3.0 - 4.0 standard drinks of alcohol per week. She reports that she does not use drugs.  Allergies  Allergen Reactions   Dexlansoprazole Other (See Comments)    ineffective   Flecainide      Pt had stroke Possible stroke   Nasal Spray     Nasal Steroids, Head Congestion   Zetia  [Ezetimibe ] Nausea Only   Cefaclor Hives and Rash   Ciprofibrate Rash    (see OV 12/24/2011)   Ciprofloxacin  Hives   Metronidazole  Hives    Other reaction(s): rash ?? (see OV 12/24/2011)    Family History  Problem Relation Age of Onset   Hypertension Mother    Hyperlipidemia Mother    Lymphoma Mother    Lung cancer Father     Prior to Admission medications   Medication Sig Start Date End Date Taking? Authorizing Provider  traMADol (ULTRAM) 50 MG tablet Take 50 mg by mouth every 6 (six) hours as needed. 08/31/23  Yes [provider]  acetaminophen  (TYLENOL ) 500 MG tablet Take 1,000 mg by mouth every 6 (six) hours as needed for moderate pain (pain score 4-6).    [provider]  albuterol  (VENTOLIN  HFA) 108 (90 Base) MCG/ACT inhaler Inhale 1-2 puffs into the lungs every 6 (six) hours as needed for wheezing or shortness of breath.    [provider]  amLODipine  (NORVASC ) 10 MG tablet Take 10 mg by mouth at bedtime.    [provider]  aspirin  EC 81 MG tablet Take 1 tablet (81 mg total) by mouth daily. Swallow whole. 06/24/23   Lesia Ozell Barter, PA-C  b complex vitamins tablet Take 1 tablet by mouth daily.    [provider]  calcium  carbonate (OS-CAL - DOSED IN MG OF ELEMENTAL CALCIUM ) 1250 (500 Ca) MG tablet Take 1 tablet by mouth daily with breakfast.    [provider]  carvedilol  (COREG ) 6.25 MG tablet TAKE 1 TABLET BY MOUTH TWICE DAILY WITH A MEAL 02/18/23   Cindie Ole DASEN, MD  celecoxib (CELEBREX) 200 MG capsule Take 200 mg by mouth 2 (two) times daily. 09/01/23   [provider]  cetirizine (ZYRTEC) 10 MG tablet Take 10 mg by mouth at bedtime.    [provider]  Cholecalciferol  (VITAMIN D ) 50 MCG (2000 UT)  tablet Take 2,000 Units by mouth daily.    [provider]  cyanocobalamin  (VITAMIN B12) 1000 MCG/ML injection Inject 1,000 mcg into the muscle every Sunday. 05/29/22   [provider]  FLUoxetine  (PROZAC ) 40 MG capsule Take 40 mg by mouth daily. 02/21/20   [provider]  gabapentin  (NEURONTIN ) 300 MG capsule Take 300-600 mg by mouth See admin instructions. Take 300 mg in the morning and 600 mg at night 04/19/20   [provider]  ipratropium (ATROVENT) 0.06 % nasal spray Place 1 spray into both nostrils daily as needed for rhinitis. 09/12/19   [provider]  latanoprost  (XALATAN ) 0.005 % ophthalmic solution Place 1 drop into both eyes every morning.     [provider]  methylphenidate  (RITALIN ) 10 MG tablet Take 5 mg by mouth daily. 12/15/21   [provider]  montelukast  (SINGULAIR ) 10 MG tablet Take 10 mg by mouth daily as needed (allergies).    [provider]  Multiple Vitamins-Minerals (MULTIVITAMIN WITH MINERALS) tablet Take 1 tablet by mouth daily.    [provider]  nitroGLYCERIN  (NITROSTAT ) 0.4 MG SL tablet Place 0.4 mg under the tongue every 5 (five) minutes as needed  for chest pain.    [provider]  Omega-3 Fatty Acids (FISH OIL) 1000 MG CAPS Take 1,000 mg by mouth daily.    [provider]  omeprazole (PRILOSEC) 20 MG capsule Take 20 mg by mouth daily as needed (acid reflux). 12/14/19   [provider]  oxycodone  (OXY-IR) 5 MG capsule Take 5 mg by mouth every 6 (six) hours as needed for pain.    [provider]  rosuvastatin  (CRESTOR ) 20 MG tablet Take 1 tablet by mouth once daily 04/14/23   Kate Lonni CROME, MD  vitamin C  (ASCORBIC ACID ) 500 MG tablet Take 500 mg by mouth daily.    [provider]    Physical Exam: Vitals:   09/11/23 1630 09/11/23 1651 09/11/23 1942 09/11/23 2130  BP: (!) 172/76  (!) 162/68   Pulse: 72  62   Resp: 19  17   Temp:  98.1 F (36.7 C) 97.9 F (36.6 C)   TempSrc:  Oral Oral   SpO2: 97%  100%   Weight:    67 kg  Height:    5' 5 (1.651 m)   Examination: Physical Exam:  Constitutional: Elderly Caucasian female in no acute distress Respiratory: Diminished to auscultation bilaterally with some coarse breath sounds, no wheezing, rales, rhonchi or crackles. Normal respiratory effort and patient is not tachypenic. No accessory muscle use.  Cardiovascular: RRR, no murmurs / rubs / gallops. S1 and S2 auscultated. No extremity edema.  Abdomen: Soft, non-tender, non-distended. Bowel sounds positive.  GU: Deferred. Musculoskeletal: No clubbing / cyanosis of digits/nails. No joint deformity upper and lower extremities.  Skin: Bruising noted specifically from her fall and it is a bruise on her left leg Neurologic: CN 2-12 grossly intact with no focal deficits. Romberg sign and cerebellar reflexes not assessed.  Psychiatric: Normal judgment and insight. Alert and oriented x 3. Normal mood and appropriate affect.   Data Reviewed:  Recent Results (from the past 2160 hours)  ECHOCARDIOGRAM COMPLETE     Status: None   Collection Time: 08/19/23 12:23 PM  Result Value Ref Range    Area-P 1/2 3.74 cm2   S' Lateral 2.60 cm   P 1/2 time 490 msec   Radius 0.53 cm   MV M vel 5.69 m/s   MV  Peak grad 129.3 mmHg   Est EF 60 - 65%   Surgical pcr screen     Status: None   Collection Time: 09/02/23  8:07 AM   Specimen: Nasal Mucosa; Nasal Swab  Result Value Ref Range   MRSA, PCR NEGATIVE NEGATIVE   Staphylococcus aureus NEGATIVE NEGATIVE    Comment: (NOTE) The Xpert SA Assay (FDA approved for NASAL specimens in patients 75 years of age and older), is one component of a comprehensive surveillance program. It is not intended to diagnose infection nor to guide or monitor treatment. Performed at [facility], [address] [place], [place] [address]   CBC per protocol     Status: Abnormal   Collection Time: 09/02/23  8:07 AM  Result Value Ref Range   WBC 5.9 4.0 - 10.5 K/uL   RBC 2.98 (L) 3.87 - 5.11 MIL/uL   Hemoglobin 10.3 (L) 12.0 - 15.0 g/dL   HCT 70.0 (L) 63.9 - 53.9 %   MCV 100.3 (H) 80.0 - 100.0 fL   MCH 34.6 (H) 26.0 - 34.0 pg   MCHC 34.4 30.0 - 36.0 g/dL   RDW 88.2 88.4 - 84.4 %   Platelets 247 150 - 400 K/uL   nRBC 0.0 0.0 - 0.2 %    Comment: Performed at [facility], [address], [place], [place] [address]  Basic metabolic panel per protocol     Status: Abnormal   Collection Time: 09/02/23  8:07 AM  Result Value Ref Range   Sodium 126 (L) 135 - 145 mmol/L   Potassium 3.0 (L) 3.5 - 5.1 mmol/L   Chloride 90 (L) 98 - 111 mmol/L   CO2 23 22 - 32 mmol/L   Glucose, Bld 104 (H) 70 - 99 mg/dL    Comment: Glucose reference range applies only to samples taken after fasting for at least 8 hours.   BUN 7 (L) 8 - 23 mg/dL   Creatinine, Ser 9.45 0.44 - 1.00 mg/dL   Calcium  8.6 (L) 8.9 - 10.3 mg/dL   GFR, Estimated >39 >39 mL/min    Comment: (NOTE) Calculated using the CKD-EPI Creatinine Equation (2021)    Anion gap 13 5 - 15    Comment: Performed at [facility], [address] [place], [place] [address]  Urinalysis, Routine w  reflex microscopic -Urine, Clean Catch     Status: Abnormal   Collection Time: 09/10/23  7:45 PM  Result Value Ref Range   Color, Urine STRAW (A) YELLOW   APPearance CLEAR CLEAR   Specific Gravity, Urine 1.004 (L) 1.005 - 1.030   pH 8.0 5.0 - 8.0   Glucose, UA NEGATIVE NEGATIVE mg/dL   Hgb urine dipstick NEGATIVE NEGATIVE   Bilirubin Urine NEGATIVE NEGATIVE   Ketones, ur 5 (A) NEGATIVE mg/dL   Protein, ur NEGATIVE NEGATIVE mg/dL   Nitrite NEGATIVE NEGATIVE   Leukocytes,Ua TRACE (A) NEGATIVE   RBC / HPF 0-5 0 - 5 RBC/hpf   WBC, UA 0-5 0 - 5 WBC/hpf   Bacteria, UA NONE SEEN NONE SEEN   Squamous Epithelial / HPF 0-5 0 - 5 /HPF    Comment: Performed at [facility], [address] [place], [place] [address]  Urine rapid drug screen (hosp performed)     Status: Abnormal   Collection Time: 09/10/23  7:45 PM  Result Value Ref Range   Opiates NONE DETECTED NONE DETECTED   Cocaine NONE DETECTED NONE DETECTED   Benzodiazepines NONE  DETECTED NONE DETECTED   Amphetamines NONE DETECTED NONE DETECTED   Tetrahydrocannabinol POSITIVE (A) NONE DETECTED   Barbiturates NONE DETECTED NONE DETECTED    Comment: (NOTE) DRUG SCREEN FOR MEDICAL PURPOSES ONLY.  IF CONFIRMATION IS NEEDED FOR ANY PURPOSE, NOTIFY LAB WITHIN 5 DAYS.  LOWEST DETECTABLE LIMITS FOR URINE DRUG SCREEN Drug Class                     Cutoff (ng/mL) Amphetamine and metabolites    1000 Barbiturate and metabolites    200 Benzodiazepine                 200 Opiates and metabolites        300 Cocaine and metabolites        300 THC                            50 Performed at [facility], [address] [place], [place] [address]   CBG monitoring, ED     Status: Abnormal   Collection Time: 09/10/23  7:52 PM  Result Value Ref Range   Glucose-Capillary 110 (H) 70 - 99 mg/dL    Comment: Glucose reference range applies only to samples taken after fasting for at least 8 hours.  Comprehensive metabolic panel      Status: Abnormal   Collection Time: 09/10/23  8:02 PM  Result Value Ref Range   Sodium 131 (L) 135 - 145 mmol/L   Potassium 2.9 (L) 3.5 - 5.1 mmol/L   Chloride 97 (L) 98 - 111 mmol/L   CO2 28 22 - 32 mmol/L   Glucose, Bld 113 (H) 70 - 99 mg/dL    Comment: Glucose reference range applies only to samples taken after fasting for at least 8 hours.   BUN 12 8 - 23 mg/dL   Creatinine, Ser 9.33 0.44 - 1.00 mg/dL   Calcium  9.1 8.9 - 10.3 mg/dL   Total Protein 6.0 (L) 6.5 - 8.1 g/dL   Albumin 3.0 (L) 3.5 - 5.0 g/dL   AST 36 15 - 41 U/L   ALT 37 0 - 44 U/L   Alkaline Phosphatase 70 38 - 126 U/L   Total Bilirubin 0.7 0.0 - 1.2 mg/dL   GFR, Estimated >39 >39 mL/min    Comment: (NOTE) Calculated using the CKD-EPI Creatinine Equation (2021)    Anion gap 6 5 - 15    Comment: Performed at [facility], [address] [place], [place] [address]  CBC     Status: Abnormal   Collection Time: 09/10/23  8:02 PM  Result Value Ref Range   WBC 6.1 4.0 - 10.5 K/uL   RBC 2.96 (L) 3.87 - 5.11 MIL/uL   Hemoglobin 10.3 (L) 12.0 - 15.0 g/dL   HCT 70.8 (L) 63.9 - 53.9 %   MCV 98.3 80.0 - 100.0 fL   MCH 34.8 (H) 26.0 - 34.0 pg   MCHC 35.4 30.0 - 36.0 g/dL   RDW 87.6 88.4 - 84.4 %   Platelets 354 150 - 400 K/uL   nRBC 0.0 0.0 - 0.2 %    Comment: Performed at [facility], [address] [place], [place] [address]  Lipase, blood     Status: None   Collection Time: 09/10/23  8:35 PM  Result Value Ref Range   Lipase 24 11 - 51 U/L    Comment: Performed at [facility]  Lab, [address] [place], [place] [address]  Ethanol     Status: None   Collection Time: 09/10/23  8:35 PM  Result Value Ref Range   Alcohol, Ethyl (B) <15 <15 mg/dL    Comment: (NOTE) For medical purposes only. Performed at [facility], [address] [place], [place] [address]   Magnesium      Status: None   Collection Time: 09/10/23  8:35 PM  Result Value Ref Range   Magnesium  1.7 1.7 - 2.4 mg/dL     Comment: Performed at [facility], [address] [place], [place] [address]  CBC with Differential     Status: Abnormal   Collection Time: 09/11/23 11:21 AM  Result Value Ref Range   WBC 6.5 4.0 - 10.5 K/uL   RBC 2.98 (L) 3.87 - 5.11 MIL/uL   Hemoglobin 10.4 (L) 12.0 - 15.0 g/dL   HCT 70.2 (L) 63.9 - 53.9 %   MCV 99.7 80.0 - 100.0 fL   MCH 34.9 (H) 26.0 - 34.0 pg   MCHC 35.0 30.0 - 36.0 g/dL   RDW 87.1 88.4 - 84.4 %   Platelets 373 150 - 400 K/uL   nRBC 0.0 0.0 - 0.2 %   Neutrophils Relative % 79 %   Neutro Abs 5.3 1.7 - 7.7 K/uL   Lymphocytes Relative 9 %   Lymphs Abs 0.6 (L) 0.7 - 4.0 K/uL   Monocytes Relative 9 %   Monocytes Absolute 0.6 0.1 - 1.0 K/uL   Eosinophils Relative 1 %   Eosinophils Absolute 0.0 0.0 - 0.5 K/uL   Basophils Relative 1 %   Basophils Absolute 0.0 0.0 - 0.1 K/uL   Immature Granulocytes 1 %   Abs Immature Granulocytes 0.03 0.00 - 0.07 K/uL    Comment: Performed at [facility], [address] [place], [place] [address]  Protime-INR     Status: None   Collection Time: 09/11/23 11:21 AM  Result Value Ref Range   Prothrombin Time 13.5 11.4 - 15.2 seconds   INR 1.0 0.8 - 1.2    Comment: (NOTE) INR goal varies based on device and disease states. Performed at [facility], [address] [place], [place] [address]   Urinalysis, w/ Reflex to Culture (Infection Suspected) -Urine, Clean Catch     Status: Abnormal   Collection Time: 09/11/23 11:21 AM  Result Value Ref Range   Specimen Source URINE, CLEAN CATCH    Color, Urine YELLOW YELLOW   APPearance CLEAR CLEAR   Specific Gravity, Urine 1.008 1.005 - 1.030   pH 9.0 (H) 5.0 - 8.0   Glucose, UA NEGATIVE NEGATIVE mg/dL   Hgb urine dipstick NEGATIVE NEGATIVE   Bilirubin Urine NEGATIVE NEGATIVE   Ketones, ur 20 (A) NEGATIVE mg/dL   Protein, ur NEGATIVE NEGATIVE mg/dL   Nitrite NEGATIVE NEGATIVE   Leukocytes,Ua NEGATIVE NEGATIVE   RBC / HPF 0-5 0 - 5 RBC/hpf   WBC, UA 0-5 0 - 5  WBC/hpf    Comment:        Reflex urine culture not performed if WBC <=10, OR if Squamous epithelial cells >5. If Squamous epithelial cells >5 suggest recollection.    Bacteria, UA NONE SEEN NONE SEEN   Squamous Epithelial / HPF 0-5 0 - 5 /HPF    Comment: Performed at [facility], [address], [place], [place] [address]  D-dimer, quantitative     Status: Abnormal   Collection Time: 09/11/23  11:21 AM  Result Value Ref Range   D-Dimer, Quant 2.43 (H) 0.00 - 0.50 ug/mL-FEU    Comment: (NOTE) At the manufacturer cut-off value of 0.5 g/mL FEU, this assay has a negative predictive value of 95-100%.This assay is intended for use in conjunction with a clinical pretest probability (PTP) assessment model to exclude pulmonary embolism (PE) and deep venous thrombosis (DVT) in outpatients suspected of PE or DVT. Results should be correlated with clinical presentation. Performed at [facility], [address], [place], [place] [address]   Resp panel by RT-PCR (RSV, Flu A&B, Covid) Anterior Nasal Swab     Status: None   Collection Time: 09/11/23 11:22 AM   Specimen: Anterior Nasal Swab  Result Value Ref Range   SARS Coronavirus 2 by RT PCR NEGATIVE NEGATIVE   Influenza A by PCR NEGATIVE NEGATIVE   Influenza B by PCR NEGATIVE NEGATIVE    Comment: (NOTE) The Xpert Xpress SARS-CoV-2/FLU/RSV plus assay is intended as an aid in the diagnosis of influenza from Nasopharyngeal swab specimens and should not be used as a sole basis for treatment. Nasal washings and aspirates are unacceptable for Xpert Xpress SARS-CoV-2/FLU/RSV testing.  Fact Sheet for Patients: BloggerCourse.com  Fact Sheet for Healthcare Providers: SeriousBroker.it  This test is not yet approved or cleared by the [place]  FDA and has been authorized for detection and/or diagnosis of SARS-CoV-2 by FDA under an Emergency Use Authorization (EUA). This EUA will  remain in effect (meaning this test can be used) for the duration of the COVID-19 declaration under Section 564(b)(1) of the Act, 21 U.S.C. section 360bbb-3(b)(1), unless the authorization is terminated or revoked.     Resp Syncytial Virus by PCR NEGATIVE NEGATIVE    Comment: (NOTE) Fact Sheet for Patients: BloggerCourse.com  Fact Sheet for Healthcare Providers: SeriousBroker.it  This test is not yet approved or cleared by the [place]  FDA and has been authorized for detection and/or diagnosis of SARS-CoV-2 by FDA under an Emergency Use Authorization (EUA). This EUA will remain in effect (meaning this test can be used) for the duration of the COVID-19 declaration under Section 564(b)(1) of the Act, 21 U.S.C. section 360bbb-3(b)(1), unless the authorization is terminated or revoked.  Performed at [facility], [address], [place], [place] [address]   Comprehensive metabolic panel with GFR     Status: Abnormal   Collection Time: 09/11/23 11:46 AM  Result Value Ref Range   Sodium 132 (L) 135 - 145 mmol/L   Potassium 2.9 (L) 3.5 - 5.1 mmol/L   Chloride 98 98 - 111 mmol/L   CO2 17 (L) 22 - 32 mmol/L   Glucose, Bld 116 (H) 70 - 99 mg/dL    Comment: Glucose reference range applies only to samples taken after fasting for at least 8 hours.   BUN 6 (L) 8 - 23 mg/dL   Creatinine, Ser 9.53 0.44 - 1.00 mg/dL   Calcium  9.1 8.9 - 10.3 mg/dL   Total Protein 6.3 (L) 6.5 - 8.1 g/dL   Albumin 3.2 (L) 3.5 - 5.0 g/dL   AST 33 15 - 41 U/L   ALT 37 0 - 44 U/L   Alkaline Phosphatase 73 38 - 126 U/L   Total Bilirubin 0.9 0.0 - 1.2 mg/dL   GFR, Estimated >39 >39 mL/min    Comment: (NOTE) Calculated using the CKD-EPI Creatinine Equation (2021)    Anion gap 17 (H) 5 - 15    Comment: Performed at [facility], [address] [place], [place] [address]  I-Stat Lactic Acid, ED     Status: None   Collection Time: 09/11/23 12:36  PM  Result Value Ref Range   Lactic Acid, Venous 1.6 0.5 - 1.9 mmol/L  Troponin I (High Sensitivity)     Status: None   Collection Time: 09/11/23  1:36 PM  Result Value Ref Range   Troponin I (High Sensitivity) 12 <18 ng/L    Comment: (NOTE) Elevated high sensitivity troponin I (hsTnI) values and significant  changes across serial measurements may suggest ACS but many other  chronic and acute conditions are known to elevate hsTnI results.  Refer to the Links section for chest pain algorithms and additional  guidance. Performed at [facility], [address] [place], [place] [address]   Brain natriuretic peptide     Status: Abnormal   Collection Time: 09/11/23  1:36 PM  Result Value Ref Range   B Natriuretic Peptide 262.3 (H) 0.0 - 100.0 pg/mL    Comment: Performed at [facility], [address] [place], [place] [address]  Troponin I (High Sensitivity)     Status: None   Collection Time: 09/11/23  3:09 PM  Result Value Ref Range   Troponin I (High Sensitivity) 11 <18 ng/L    Comment: (NOTE) Elevated high sensitivity troponin I (hsTnI) values and significant  changes across serial measurements may suggest ACS but many other  chronic and acute conditions are known to elevate hsTnI results.  Refer to the Links section for chest pain algorithms and additional  guidance. Performed at [facility], [address] [place], [place] [address]   Magnesium      Status: None   Collection Time: 09/11/23  4:26 PM  Result Value Ref Range   Magnesium  1.8 1.7 - 2.4 mg/dL    Comment: Performed at [facility], [address] [place], [place] [address]  Phosphorus     Status: None   Collection Time: 09/11/23  4:26 PM  Result Value Ref Range   Phosphorus 3.2 2.5 - 4.6 mg/dL    Comment: Performed at [facility], [address], [place], [place] [address]   EKG: My interpretation shows the patient has sinus rhythm of 67 with a incomplete right bundle  branch block and a left anterior fascicular block.  PR prolonged PR interval on the QTc of 471 with no evidence of ST elevation my interpretation.  Assessment and Plan: No notes have been filed under this hospital service. Service: Hospitalist  Dizziness and near syncope: Admit to the telemetry unit and placed in observation.  Give IV fluid hydration.  Check orthostatic vital signs.  Has had persistent dizziness the last few days and was discharged home after fluids yesterday but dizziness persisted and she was near syncopal.  Checked troponins and they were not elevated.  Evaluated for PE and this was ruled out.  Head CT done yesterday and was unremarkable we will get an MRI of the brain for further evaluation.  Recent echo was done earlier this month.  THC was positive on UDS.  Blood cultures x 2 pending.  Check TSH.  UA not really indicative of a urinary tract infection.  Will need PT OT to further evaluate as well as a vestibular evaluation.  Dyspnea: Had associated dyspnea this morning and was ruled out for PE even though that her D-dimer was elevated.  Continue monitor and give IV fluid hydration repeat chest  x-ray in the a.m.  Hypokalemia: Potassium has been low at 2.9.  Continue aggressive repletion with IV fluid hydration with potassium manage and p.o. potassium.  Was not able to tolerate IV potassium.  Check magnesium  level.    Hyponatremia: Patient's sodium was low at 131 and increased to 132.  Continue IV fluid hydration as above.  Continue monitor and and trend and repeat CMP in a.m.  Elevated anion gap metabolic acidosis: Question if this is in the setting of her poor p.o. intake.  States that she vomited once and has been having some diarrhea but only lasted for a day.  If she continues to have persistent diarrhea we will check her stool for infection.  Continue IV fluid hydration as above  History of A-fib: Currently in sinus rhythm.  Continue with carvedilol .  Normocytic anemia:  Hemoglobin/hematocrit went from 10.3/29.1 and is now 10.4/29.7.  Check anemia panel in AM.  Continue monitor for signs and symptoms bleeding; no overt bleeding noted.  Repeat CBC in a.m.  Hypoalbuminemia: Patient's Albumin Trend: Recent Labs  Lab 09/10/23 2002 09/11/23 1146  ALBUMIN 3.0* 3.2*  -Continue to Monitor and Trend and repeat CMP in the AM  GERD/GI prophylaxis: Continue with PPI with pantoprazole .  Advance Care Planning:   Code Status: Full Code   Consults: None  Family Communication: Discussed with husband at bedside  Severity of Illness: The appropriate patient status for this patient is OBSERVATION. Observation status is judged to be reasonable and necessary in order to provide the required intensity of service to ensure the patient's safety. The patient's presenting symptoms, physical exam findings, and initial radiographic and laboratory data in the context of their medical condition is felt to place them at decreased risk for further clinical deterioration. Furthermore, it is anticipated that the patient will be medically stable for discharge from the hospital within 2 midnights of admission.   Author: Alejandro Marker, DO 09/11/2023 10:20 PM  For on call review [email or web address].

## 2023-09-11 NOTE — ED Provider Notes (Signed)
 [place] EMERGENCY DEPARTMENT AT [facility] Provider Note   CSN: [id number] Arrival date & time: 09/11/23  1037     Patient presents with: Dizziness   Michelle Johnston is a 73 y.o. female.   Patient is a 73 year old female with multiple medical problems including prior history of A-fib ablation now with a watchman's only on aspirin , hypertension, GERD, multiple falls with recent surgery.  Most recent surgery was 1 week ago of her shoulder who is returning to the emergency room today due to complaints of dizziness when she attempts to stand and walk.  She reports that she has been feeling this for the last 2 days but has also been having hot and cold chills.  She did have a headache yesterday but denies having a headache currently.  She denies any change in her vision or unilateral numbness or weakness.  She reports that she is not feel dizzy until she gets up and tries to walk and then feels like she is going to pass out.  She also has noticed some shortness of breath today.  She denies any pain or swelling in her legs.  The history is provided by the patient, medical records and a friend.  Dizziness      Prior to Admission medications   Medication Sig Start Date End Date Taking? Authorizing Provider  acetaminophen  (TYLENOL ) 500 MG tablet Take 1,000 mg by mouth every 6 (six) hours as needed for moderate pain (pain score 4-6).    [provider]  albuterol  (VENTOLIN  HFA) 108 (90 Base) MCG/ACT inhaler Inhale 1-2 puffs into the lungs every 6 (six) hours as needed for wheezing or shortness of breath.    [provider]  amLODipine  (NORVASC ) 10 MG tablet Take 10 mg by mouth at bedtime.    [provider]  aspirin  EC 81 MG tablet Take 1 tablet (81 mg total) by mouth daily. Swallow whole. 06/24/23   Lesia Ozell Barter, PA-C  b complex vitamins tablet Take 1 tablet by mouth daily.    [provider]  calcium  carbonate (OS-CAL - DOSED IN MG OF  ELEMENTAL CALCIUM ) 1250 (500 Ca) MG tablet Take 1 tablet by mouth daily with breakfast.    [provider]  carvedilol  (COREG ) 6.25 MG tablet TAKE 1 TABLET BY MOUTH TWICE DAILY WITH A MEAL 02/18/23   Cindie Ole DASEN, MD  celecoxib (CELEBREX) 200 MG capsule Take 200 mg by mouth 2 (two) times daily. 09/01/23   [provider]  cetirizine (ZYRTEC) 10 MG tablet Take 10 mg by mouth at bedtime.    [provider]  Cholecalciferol  (VITAMIN D ) 50 MCG (2000 UT) tablet Take 2,000 Units by mouth daily.    [provider]  cyanocobalamin  (VITAMIN B12) 1000 MCG/ML injection Inject 1,000 mcg into the muscle every Sunday. 05/29/22   [provider]  FLUoxetine  (PROZAC ) 40 MG capsule Take 40 mg by mouth daily. 02/21/20   [provider]  gabapentin  (NEURONTIN ) 300 MG capsule Take 300-600 mg by mouth See admin instructions. Take 300 mg in the morning and 600 mg at night 04/19/20   [provider]  ipratropium (ATROVENT) 0.06 % nasal spray Place 1 spray into both nostrils daily as needed for rhinitis. 09/12/19   [provider]  latanoprost  (XALATAN ) 0.005 % ophthalmic solution Place 1 drop into both eyes every morning.     [provider]  methylphenidate  (RITALIN ) 10 MG tablet Take 5 mg by mouth daily. 12/15/21  [provider]  montelukast  (SINGULAIR ) 10 MG tablet Take 10 mg by mouth daily as needed (allergies).    [provider]  Multiple Vitamins-Minerals (MULTIVITAMIN WITH MINERALS) tablet Take 1 tablet by mouth daily.    [provider]  nitroGLYCERIN  (NITROSTAT ) 0.4 MG SL tablet Place 0.4 mg under the tongue every 5 (five) minutes as needed for chest pain.    [provider]  Omega-3 Fatty Acids (FISH OIL) 1000 MG CAPS Take 1,000 mg by mouth daily.    [provider]  omeprazole (PRILOSEC) 20 MG capsule Take 20 mg by mouth daily as needed (acid reflux). 12/14/19   [provider]  oxycodone  (OXY-IR) 5 MG capsule Take 5 mg by mouth every 6 (six) hours as needed for pain.    [provider]  rosuvastatin  (CRESTOR ) 20 MG tablet Take 1 tablet by mouth once daily 04/14/23   Kate Lonni CROME, MD  vitamin C  (ASCORBIC ACID ) 500 MG tablet Take 500 mg by mouth daily.    [provider]    Allergies: Dexlansoprazole, Flecainide , Nasal spray, Zetia  [ezetimibe ], Cefaclor, Ciprofibrate, Ciprofloxacin , and Metronidazole     Review of Systems  Neurological:  Positive for dizziness.    Updated Vital Signs BP (!) 161/57   Pulse 65   Temp 99.6 F (37.6 C) (Rectal)   Resp 15   SpO2 100%   Physical Exam Vitals and nursing note reviewed.  Constitutional:      General: She is not in acute distress.    Appearance: She is well-developed.  HENT:     Head: Normocephalic and atraumatic.  Eyes:     General: No visual field deficit.    Pupils: Pupils are equal, round, and reactive to light.  Cardiovascular:     Rate and Rhythm: Normal rate and regular rhythm.     Heart sounds: Normal heart sounds. No murmur heard.    No friction rub.  Pulmonary:     Effort: Pulmonary effort is normal. Tachypnea present.     Breath sounds: Normal breath sounds. No wheezing or rales.  Abdominal:     General: Bowel sounds are normal. There is no distension.     Palpations: Abdomen is soft.     Tenderness: There is no abdominal tenderness. There is no guarding or rebound.  Musculoskeletal:        General: No tenderness. Normal range of motion.     Comments: No edema.  Extensive ecchymosis over the left shoulder and rib cage.  Surgical bandage in place over the left shoulder with no erythema or drainage.  Skin:    General: Skin is warm and dry.     Findings: No rash.     Comments: Hot to the touch  Neurological:     Mental Status: She is alert and oriented to person, place, and time.     Cranial Nerves: No cranial nerve deficit, dysarthria or facial asymmetry.      Motor: No weakness or pronator drift.     Coordination: Finger-Nose-Finger Test and Heel to Whiting Test normal.     Comments: Extraocular movements are intact.  Patient not ambulated due to feeling dizzy with standing.  Psychiatric:        Behavior: Behavior normal.     (all labs ordered are listed, but only abnormal results are displayed) Labs Reviewed  CBC WITH DIFFERENTIAL/PLATELET - Abnormal; Notable for the following components:      Result Value   RBC 2.98 (*)  Hemoglobin 10.4 (*)    HCT 29.7 (*)    MCH 34.9 (*)    Lymphs Abs 0.6 (*)    All other components within normal limits  URINALYSIS, W/ REFLEX TO CULTURE (INFECTION SUSPECTED) - Abnormal; Notable for the following components:   pH 9.0 (*)    Ketones, ur 20 (*)    All other components within normal limits  D-DIMER, QUANTITATIVE - Abnormal; Notable for the following components:   D-Dimer, Quant 2.43 (*)    All other components within normal limits  COMPREHENSIVE METABOLIC PANEL WITH GFR - Abnormal; Notable for the following components:   Sodium 132 (*)    Potassium 2.9 (*)    CO2 17 (*)    Glucose, Bld 116 (*)    BUN 6 (*)    Total Protein 6.3 (*)    Albumin 3.2 (*)    Anion gap 17 (*)    All other components within normal limits  BRAIN NATRIURETIC PEPTIDE - Abnormal; Notable for the following components:   B Natriuretic Peptide 262.3 (*)    All other components within normal limits  RESP PANEL BY RT-PCR (RSV, FLU A&B, COVID)  RVPGX2  CULTURE, BLOOD (ROUTINE X 2)  CULTURE, BLOOD (ROUTINE X 2)  PROTIME-INR  I-STAT CG4 LACTIC ACID, ED  TROPONIN I (HIGH SENSITIVITY)  TROPONIN I (HIGH SENSITIVITY)    EKG: EKG Interpretation Date/Time:  Saturday September 11 2023 11:31:35 EDT Ventricular Rate:  67 PR Interval:  236 QRS Duration:  117 QT Interval:  446 QTC Calculation: 471 R Axis:   -46  Text Interpretation: Sinus or ectopic atrial rhythm Prolonged PR interval Incomplete RBBB and LAFB Probable anterior infarct,  age indeterminate No significant change since last tracing Confirmed by Doretha Folks ([id number]) on 09/11/2023 11:46:26 AM  Radiology: CT Angio Chest PE W and/or Wo Contrast Result Date: 09/11/2023 CLINICAL DATA:  Dizziness, pulmonary embolism suspected, positive D-dimer EXAM: CT ANGIOGRAPHY CHEST WITH CONTRAST TECHNIQUE: Multidetector CT imaging of the chest was performed using the standard protocol during bolus administration of intravenous contrast. Multiplanar CT image reconstructions and MIPs were obtained to evaluate the vascular anatomy. RADIATION DOSE REDUCTION: This exam was performed according to the departmental dose-optimization program which includes automated exposure control, adjustment of the mA and/or kV according to patient size and/or use of iterative reconstruction technique. CONTRAST:  75mL OMNIPAQUE  IOHEXOL  350 MG/ML SOLN COMPARISON:  Same day chest radiograph and prior studies FINDINGS: Cardiovascular: Satisfactory opacification of the pulmonary arteries to the segmental level. No evidence of pulmonary embolism. Unchanged cardiomegaly. Mildly enlarged ascending thoracic aorta, measuring up to 4.2 cm. Left atrial appendage occlusion device. No pericardial effusion. Mediastinum/Nodes: No lymphadenopathy. Lungs/Pleura: Emphysema. Stable tiny bilateral pulmonary nodules measuring up to 0.2 cm. No focal consolidation. Accessory azygous lobe with azygous vein traversing through the azygous fissure. No pleural effusion or pneumothorax. Upper Abdomen: No acute abnormality. Musculoskeletal: No acute osseous findings. Multilevel intervertebral changes with anterolisthesis of C7 on T1. Review of the MIP images confirms the above findings. IMPRESSION: 1. No pulmonary embolism identified. 2. Mildly enlarged ascending thoracic aorta measuring up to 4.2 cm. Emphysema (ICD10-J43.9). Electronically Signed   By: Michaeline Blanch M.D.   On: 09/11/2023 14:47   DG Chest Port 1 View Result Date:  09/11/2023 CLINICAL DATA:  Questionable sepsis - evaluate for abnormality EXAM: PORTABLE CHEST 1 VIEW COMPARISON:  August 07, 2021 FINDINGS: The cardiomediastinal silhouette is unchanged in contour.Watchman device. No pleural effusion. No pneumothorax. Azygous fissure. No acute pleuroparenchymal abnormality. Status  post LEFT shoulder arthroplasty IMPRESSION: No acute cardiopulmonary abnormality. Electronically Signed   By: Corean Salter M.D.   On: 09/11/2023 12:32   CT Head Wo Contrast Result Date: 09/10/2023 CLINICAL DATA:  Headache, dizziness, altered mental status. EXAM: CT HEAD WITHOUT CONTRAST TECHNIQUE: Contiguous axial images were obtained from the base of the skull through the vertex without intravenous contrast. RADIATION DOSE REDUCTION: This exam was performed according to the departmental dose-optimization program which includes automated exposure control, adjustment of the mA and/or kV according to patient size and/or use of iterative reconstruction technique. COMPARISON:  07/01/2023 FINDINGS: Brain: The brainstem, cerebellum, cerebral peduncles, thalami, basal ganglia, basilar cisterns, and ventricular system appear within normal limits. No intracranial hemorrhage, mass lesion, or acute CVA. Vascular: There is atherosclerotic calcification of the cavernous carotid arteries bilaterally. 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 Other: Stable subcutaneous lesion in the left posterior parietal scalp on image 29 series 2, probably a dense a basis cyst or similar benign lesion. No change from prior. IMPRESSION: 1. No acute intracranial findings. 2. Mild chronic right posterior ethmoid sinusitis. 3. Prior left maxillary antrostomy. 4. 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 5. Stable subcutaneous lesion in  the left posterior parietal scalp, probably a dense sebaceous cyst or similar benign lesion. Electronically Signed   By: Ryan Salvage M.D.   On: 09/10/2023 21:13     Procedures   Medications Ordered in the ED  lactated ringers  infusion (has no administration in time range)  potassium chloride  SA (KLOR-CON  M) CR tablet 40 mEq (40 mEq Oral Given 09/11/23 1413)  potassium chloride  10 mEq in 100 mL IVPB (0 mEq Intravenous Stopped 09/11/23 1523)  iohexol  (OMNIPAQUE ) 350 MG/ML injection 75 mL (75 mLs Intravenous Contrast Given 09/11/23 1431)                                    Medical Decision Making Amount and/or Complexity of Data Reviewed External Data Reviewed: notes. Labs: ordered. Decision-making details documented in ED Course. Radiology: ordered and independent interpretation performed. Decision-making details documented in ED Course. ECG/medicine tests: ordered and independent interpretation performed. Decision-making details documented in ED Course.  Risk Prescription drug management. Decision regarding hospitalization.   Pt with multiple medical problems and comorbidities and presenting today with a complaint that caries a high risk for morbidity and mortality.  Here today with the above complaints.  Concern for infectious etiology, PE, cardiac etiology.  She describes her dizziness as a sensation of feeling like she is going to pass out.  Was seen in the emergency room yesterday had hypokalemia, negative head CT and a nonspecific EKG.  However after going home she is feeling worse.  Will start undifferentiated sepsis workup as she is recently had surgery and a high risk for pneumonia or PE.  She only takes an aspirin  at this time.  She has no focal neurologic findings on exam at this time.  No abdominal pain.  She is able to speak in full sentences and sats are 100% on room air. 3:24 PM I independently interpreted patient's labs and lactic acid was normal limits, rectal  temperature is negative, viral study is negative, troponin is within normal limits.  CMP with persistent hypokalemia of 2.9, mild hyponatremia of 132 but now patient has an  anion gap of 17, CBC without acute changes, UA with ketones.  D-dimer is elevated 2.43, BNP mildly elevated at 262.  And a CTA was done.  I have independently visualized and interpreted pt's images today.  Chest x-ray without acute findings.  CTA shows no evidence of PE.  Radiology does report mildly enlarged ascending thoracic aorta. Given patient's recurrent dizziness, inability to walk, now anion gap and signs of dehydration feel that patient needs admission.     Final diagnoses:  Dehydration  Hypokalemia  Dizziness    ED Discharge Orders     None          Doretha Folks, MD 09/11/23 1524

## 2023-09-11 NOTE — ED Triage Notes (Signed)
 Pt BIB EMS headache for dizziness x 4 days. Same symptoms as yesterday. Aox4.

## 2023-09-11 NOTE — ED Notes (Signed)
 Pt wheeled to bathroom in w/c to collect urine sample.

## 2023-09-11 NOTE — ED Provider Notes (Signed)
  Physical Exam  BP (!) 161/57   Pulse 65   Temp 99.6 F (37.6 C) (Rectal)   Resp 15   SpO2 100%   Physical Exam  Procedures  Procedures  ED Course / MDM    Medical Decision Making Care assumed at 3 PM.  Patient is here with dizziness.  Patient was found to be hypokalemic yesterday and potassium was replaced and patient was sent home.  Patient has persistent poor appetite and had some shortness of breath and dizziness and had a hard time walking.  D-dimer was elevated and CTA did not show any PE.  Patient also has an anion gap of 17 now and potassium is persistently low at 2.9.  Signout pending troponin and BNP and admission  4:04 PM BNP is 260 and troponin is normal.  Patient will be admitted by the hospitalist service for dehydration and persistent hypokalemia.  Problems Addressed: Dehydration: acute illness or injury Dizziness: acute illness or injury Hypokalemia: acute illness or injury  Amount and/or Complexity of Data Reviewed Labs: ordered. Radiology: ordered and independent interpretation performed. Decision-making details documented in ED Course. ECG/medicine tests: ordered and independent interpretation performed. Decision-making details documented in ED Course.  Risk Prescription drug management. Decision regarding hospitalization.          Patt Alm Macho, MD 09/11/23 [id number]

## 2023-09-12 ENCOUNTER — Observation Stay (HOSPITAL_COMMUNITY)

## 2023-09-12 DIAGNOSIS — R7881 Bacteremia: Secondary | ICD-10-CM

## 2023-09-12 DIAGNOSIS — R55 Syncope and collapse: Secondary | ICD-10-CM | POA: Diagnosis not present

## 2023-09-12 DIAGNOSIS — S42302A Unspecified fracture of shaft of humerus, left arm, initial encounter for closed fracture: Secondary | ICD-10-CM | POA: Diagnosis not present

## 2023-09-12 DIAGNOSIS — Z471 Aftercare following joint replacement surgery: Secondary | ICD-10-CM | POA: Diagnosis not present

## 2023-09-12 DIAGNOSIS — R0600 Dyspnea, unspecified: Secondary | ICD-10-CM | POA: Diagnosis not present

## 2023-09-12 DIAGNOSIS — Z96612 Presence of left artificial shoulder joint: Secondary | ICD-10-CM | POA: Diagnosis not present

## 2023-09-12 LAB — BLOOD CULTURE ID PANEL (REFLEXED) - BCID2

## 2023-09-12 LAB — COMPREHENSIVE METABOLIC PANEL WITH GFR
ALT: 31 U/L (ref 0–44)
AST: 28 U/L (ref 15–41)
Albumin: 3.4 g/dL — ABNORMAL LOW (ref 3.5–5.0)
Alkaline Phosphatase: 87 U/L (ref 38–126)
Anion gap: 9 (ref 5–15)
BUN: <5 mg/dL — ABNORMAL LOW (ref 8–23)
CO2: 19 mmol/L — ABNORMAL LOW (ref 22–32)
Calcium: 9.1 mg/dL (ref 8.9–10.3)
Chloride: 103 mmol/L (ref 98–111)
Creatinine, Ser: 0.32 mg/dL — ABNORMAL LOW (ref 0.44–1.00)
GFR, Estimated: >60 mL/min (ref >60)
Glucose, Bld: 115 mg/dL — ABNORMAL HIGH (ref 70–99)
Potassium: 4.1 mmol/L (ref 3.5–5.1)
Sodium: 131 mmol/L — ABNORMAL LOW (ref 135–145)
Total Bilirubin: 0.8 mg/dL (ref 0.0–1.2)
Total Protein: 6.4 g/dL — ABNORMAL LOW (ref 6.5–8.1)

## 2023-09-12 LAB — CBC WITH DIFFERENTIAL/PLATELET
Abs Immature Granulocytes: 0.02 K/uL (ref 0.00–0.07)
Basophils Absolute: 0 K/uL (ref 0.0–0.1)
Basophils Relative: 0 %
Eosinophils Absolute: 0.1 K/uL (ref 0.0–0.5)
Eosinophils Relative: 2 %
HCT: 31.7 % — ABNORMAL LOW (ref 36.0–46.0)
Hemoglobin: 10.9 g/dL — ABNORMAL LOW (ref 12.0–15.0)
Immature Granulocytes: 0 %
Lymphocytes Relative: 17 %
Lymphs Abs: 1.2 K/uL (ref 0.7–4.0)
MCH: 34.9 pg — ABNORMAL HIGH (ref 26.0–34.0)
MCHC: 34.4 g/dL (ref 30.0–36.0)
MCV: 101.6 fL — ABNORMAL HIGH (ref 80.0–100.0)
Monocytes Absolute: 0.6 K/uL (ref 0.1–1.0)
Monocytes Relative: 9 %
Neutro Abs: 4.8 K/uL (ref 1.7–7.7)
Neutrophils Relative %: 72 %
Platelets: 366 K/uL (ref 150–400)
RBC: 3.12 MIL/uL — ABNORMAL LOW (ref 3.87–5.11)
RDW: 13.2 % (ref 11.5–15.5)
WBC: 6.8 K/uL (ref 4.0–10.5)
nRBC: 0 % (ref 0.0–0.2)

## 2023-09-12 LAB — MAGNESIUM: Magnesium: 2.1 mg/dL (ref 1.7–2.4)

## 2023-09-12 LAB — PHOSPHORUS: Phosphorus: 2.5 mg/dL (ref 2.5–4.6)

## 2023-09-12 LAB — TSH: TSH: 2.151 u[IU]/mL (ref 0.350–4.500)

## 2023-09-12 MED ORDER — FLUTICASONE PROPIONATE 50 MCG/ACT NA SUSP
1.0000 | Freq: Every day | NASAL | Status: DC
  Administered 2023-09-12 – 2023-09-14 (×2): 1 via NASAL
  Filled 2023-09-12: qty 16

## 2023-09-12 MED ORDER — AMOXICILLIN-POT CLAVULANATE 875-125 MG PO TABS: 1.0000 | ORAL_TABLET | Freq: Two times a day (BID) | ORAL | 0 refills | Status: DC

## 2023-09-12 MED ORDER — AMOXICILLIN-POT CLAVULANATE 875-125 MG PO TABS
1.0000 | ORAL_TABLET | Freq: Two times a day (BID) | ORAL | Status: DC
  Administered 2023-09-12 – 2023-09-13 (×2): 1 via ORAL
  Filled 2023-09-12 (×3): qty 1

## 2023-09-12 MED ORDER — SODIUM BICARBONATE 650 MG PO TABS
650.0000 mg | ORAL_TABLET | Freq: Two times a day (BID) | ORAL | 0 refills | Status: AC
Start: 2023-09-12 — End: 2023-09-15

## 2023-09-12 MED ORDER — ONDANSETRON HCL 4 MG PO TABS: 4.0000 mg | ORAL_TABLET | Freq: Four times a day (QID) | ORAL | 0 refills | Status: AC | PRN

## 2023-09-12 MED ORDER — BISACODYL 10 MG RE SUPP
10.0000 mg | Freq: Every day | RECTAL | 0 refills | Status: AC | PRN
Start: 2023-09-12 — End: ?

## 2023-09-12 MED ORDER — ENSURE PLUS HIGH PROTEIN PO LIQD: 237.0000 mL | Freq: Two times a day (BID) | ORAL | 0 refills | Status: AC

## 2023-09-12 MED ORDER — GUAIFENESIN ER 600 MG PO TB12: 600.0000 mg | ORAL_TABLET | Freq: Two times a day (BID) | ORAL | 0 refills | Status: AC

## 2023-09-12 MED ORDER — FLUTICASONE PROPIONATE 50 MCG/ACT NA SUSP: 1.0000 | Freq: Every day | NASAL | 0 refills | Status: AC

## 2023-09-12 MED ORDER — SODIUM BICARBONATE 650 MG PO TABS
650.0000 mg | ORAL_TABLET | Freq: Two times a day (BID) | ORAL | Status: DC
  Administered 2023-09-12 – 2023-09-15 (×7): 650 mg via ORAL
  Filled 2023-09-12 (×7): qty 1

## 2023-09-12 MED ORDER — POLYETHYLENE GLYCOL 3350 17 G PO PACK: 17.0000 g | PACK | Freq: Every day | ORAL | 0 refills | Status: AC | PRN

## 2023-09-12 NOTE — Progress Notes (Signed)
 PROGRESS NOTE    SOLEIA BADOLATO  FMW:[id number] DOB: 08/21/1950 DOA: 09/11/2023 PCP: Loreli Kins, MD   Brief Narrative:  Michelle Johnston is a 72 y.o. female with medical history significant of significant for but not limited to history of atrial fibrillation status post ablation and Watchman, depression, anxiety, GERD, essential hypertension, sleep apnea noncompliant with CPAP, history of bradycardia, glaucoma and other comorbidities who presented to the hospital with dizziness and dyspnea.  Patient states that she was dizzy a few days ago and presented to the ED yesterday and given IV fluids and discharged home.  Subsequently she woke up this morning and was persistently dizzy and felt as if she is going to pass out but did not actually pass out.  Had a fall a few weeks ago and had to have shoulder surgery last week.  Since her surgery her appetite has not been the best and symptoms have started worsening.  This morning when she woke up she felt dizzy but also had associated dyspnea.  States that since her shoulder surgery last week her appetite has not been the best but she has been eating.  States that she may have had 1 episode of vomiting but no real nausea.  States that she also had some diarrhea for about a day.  Denies any chest discomfort but did state that she feels dizzy still little bit as and had some dyspnea.  She denied any burning or discomfort in her urine or any other symptoms.  TRH was asked to admit this patient for further evaluation of her dizziness and presyncopal episode along with her associated dyspnea.   **Interim Hx: Syncope workup has been completed and she will be discharged home however prior to discharge her blood cultures turned positive for a gram-negative bacteremia.  She has been placed on oral Augmentin  and will need to await cultures and repeat blood cultures in a.m.  Will discuss with ID in the morning and if she continues to be stable without issues will discuss with ID  about discharge recommendations and discharge her home with home health.  Assessment and Plan:  Dizziness and near syncope: Admit to the telemetry unit and placed in observation.  Give IV fluid hydration.  Check orthostatic vital signs.  Has had persistent dizziness the last few days and was discharged home after fluids yesterday but dizziness persisted and she was near syncopal.  Checked troponins and they were not elevated.  Evaluated for PE and this was ruled out.  Head CT done yesterday and was unremarkable we will get an MRI of the brain for further evaluation and showed Minimal nonspecific subcortical white matter disease. Otherwise, normal brain.Mild left maxillary sinus disease.. -Recent echo was done earlier this month.  THC was positive on UDS.  Blood cultures x 2 POSITVE for GNR BACTEREMIA.  Checked TSH and was 2.151.  UA not really indicative of a urinary tract infection.  PT OT to further evaluate as well as a vestibular evaluation and are recommending HH and Vestibular Evaluation done and they feel like she had a hypofunction of the Vestibular if no medical issue was found for dizziness.   GNR Bacteremia: + Cx w/ unidentified Bacteria. Started Augmentin . Repeat Blood Cx x2 in the AM. D/w ID given unidentified Bacteria on Biofire.   Maxillary Sinus Disease: Flonase  and Augmentin .   Dyspnea: Improved . Had associated dyspnea the morning of Admission and was ruled out for PE even though that her D-dimer was elevated.  Continue monitor  and give IV fluid hydration. Repeat chest x-ray in this AM showed no acute cardiopulmonary disease. Flutter Valve, Incentive Spirometry, and Guaifenesin  600 mg intiated along with Xopenex  [id number]  Recent Shoulder Surgery: Ortho was contacted and DG Shoulder done and showed Reverse shoulder arthroplasty without complication. She is to have NO ROM at L Shoulder and NWB w/ Left arm and in Sling all time. She is ok of AROM at L elbow base, wrist and fingers, and  recommending not bearing weight on operative arm if she can walk with a cane in R hand.    Hypokalemia: Potassium had been low at 2.9 but improved to 4.1.  Was not able to tolerate IV potassium.  Checked magnesium  level and was 2.1   Hyponatremia: Patient's Na+ went from 131 -> 132 -> 131.  Continue IV fluid hydration as above.  Continue monitor and and trend and repeat CMP in a.m.   Elevated anion gap metabolic acidosis: Question if this is in the setting of her poor p.o. intake.  States that she vomited once and has been having some diarrhea but only lasted for a day.  If she continues to have persistent diarrhea we will check her stool for infection.  Continue IV fluid hydration as above and MA is improving.  Now has a CO2 of 19, chloride level of 103, anion gap of 9.   History of A-fib: Currently in sinus rhythm.  Continue with carvedilol .   Normocytic/Macrocytic anemia: Hemoglobin/hematocrit went from 10.3/29.1 -> 10.4/29.7 -> 10.9/31.7.  Check anemia panel in AM.  Continue monitor for signs and symptoms bleeding; no overt bleeding noted.  Repeat CBC in a.m.   Hypoalbuminemia: Patient's Albumin Trend: Recent Labs  Lab 09/10/23 2002 09/11/23 1146 09/12/23 0816  ALBUMIN 3.0* 3.2* 3.4*  -Continue to Monitor and Trend and repeat CMP in the AM   GERD/GI prophylaxis: Continue with PPI with pantoprazole    DVT prophylaxis: enoxaparin  (LOVENOX ) injection 40 mg Start: 09/11/23 1830 SCDs Start: 09/11/23 1732    Code Status: Full Code Family Communication D/w Husband @ bedside  Disposition Plan:  Level of care: Telemetry Medical Status is: Observation The patient remains OBS appropriate and will d/c before 2 midnights.   Consultants:  None  Procedures:  As delineated as above  Antimicrobials:  Anti-infectives (From admission, onward)    Start     Dose/Rate Route Frequency Ordered Stop   09/12/23 1130  amoxicillin -clavulanate (AUGMENTIN ) 875-125 MG per tablet 1 tablet        1  tablet Oral Every 12 hours 09/12/23 1033     09/12/23 0000  amoxicillin -clavulanate (AUGMENTIN ) 875-125 MG tablet        1 tablet Oral Every 12 hours 09/12/23 1037 09/17/23 2359       Subjective: Seen and examined at bedside and her dizziness was improving.  Was about to be discharged home however her blood cultures tested positive so we will watch and start her on antibiotics with Augmentin .  Orthostatics were negative.  She denies any nausea or vomiting.  No other concerns or complaints at this time.  Objective: Vitals:   09/12/23 0358 09/12/23 0742 09/12/23 1632 09/12/23 1638  BP: (!) 161/63 (!) 154/70  (!) 169/70  Pulse: 67 71  71  Resp:  18  16  Temp:  97.8 F (36.6 C) 98 F (36.7 C) (!) 97.4 F (36.3 C)  TempSrc:   Oral   SpO2:  99%  99%  Weight:      Height:  Intake/Output Summary (Last 24 hours) at 09/12/2023 2151 Last data filed at 09/12/2023 0800 Gross per 24 hour  Intake 732.98 ml  Output --  Net 732.98 ml   Filed Weights   09/11/23 2130  Weight: 67 kg   Examination: Physical Exam:  Constitutional: WN/WD elderly Caucasian female in no acute distress Respiratory: Diminished to auscultation bilaterally, no wheezing, rales, rhonchi or crackles. Normal respiratory effort and patient is not tachypenic. No accessory muscle use.  Unlabored breathing Cardiovascular: RRR, no murmurs / rubs / gallops. S1 and S2 auscultated. No extremity edema.  Abdomen: Soft, non-tender, nondistended. Bowel sounds positive.  GU: Deferred. Musculoskeletal: No clubbing / cyanosis of digits/nails. No joint deformity upper and lower extremities.  Skin: No rashes, lesions, ulcers limited skin evaluation. No induration; Warm and dry.  Neurologic: CN 2-12 grossly intact with no focal deficits. Romberg sign and cerebellar reflexes not assessed.  Psychiatric: Normal judgment and insight. Alert and oriented x 3. Normal mood and appropriate affect.   Data Reviewed: I have personally  reviewed following labs and imaging studies  CBC: Recent Labs  Lab 09/10/23 2002 09/11/23 1121 09/12/23 0542  WBC 6.1 6.5 6.8  NEUTROABS  --  5.3 4.8  HGB 10.3* 10.4* 10.9*  HCT 29.1* 29.7* 31.7*  MCV 98.3 99.7 101.6*  PLT 354 373 366   Basic Metabolic Panel: Recent Labs  Lab 09/10/23 2002 09/10/23 2035 09/11/23 1146 09/11/23 1626 09/12/23 0542 09/12/23 0816  NA 131*  --  132*  --   --  131*  K 2.9*  --  2.9*  --   --  4.1  CL 97*  --  98  --   --  103  CO2 28  --  17*  --   --  19*  GLUCOSE 113*  --  116*  --   --  115*  BUN 12  --  6*  --   --  <5*  CREATININE 0.66  --  0.46  --   --  0.32*  CALCIUM  9.1  --  9.1  --   --  9.1  MG  --  1.7  --  1.8 2.1  --   PHOS  --   --   --  3.2 2.5  --    GFR: Estimated Creatinine Clearance: 54.7 mL/min (A) (by C-G formula based on SCr of 0.32 mg/dL (L)). Liver Function Tests: Recent Labs  Lab 09/10/23 2002 09/11/23 1146 09/12/23 0816  AST 36 33 28  ALT 37 37 31  ALKPHOS 70 73 87  BILITOT 0.7 0.9 0.8  PROT 6.0* 6.3* 6.4*  ALBUMIN 3.0* 3.2* 3.4*   Recent Labs  Lab 09/10/23 2035  LIPASE 24   No results for input(s): AMMONIA in the last 168 hours. Coagulation Profile: Recent Labs  Lab 09/11/23 1121  INR 1.0   Cardiac Enzymes: No results for input(s): CKTOTAL, CKMB, CKMBINDEX, TROPONINI in the last 168 hours. BNP (last 3 results) No results for input(s): PROBNP in the last 8760 hours. HbA1C: No results for input(s): HGBA1C in the last 72 hours. CBG: Recent Labs  Lab 09/10/23 1952  GLUCAP 110*   Lipid Profile: No results for input(s): CHOL, HDL, LDLCALC, TRIG, CHOLHDL, LDLDIRECT in the last 72 hours. Thyroid Function Tests: Recent Labs    09/12/23 0542  TSH 2.151   Anemia Panel: No results for input(s): VITAMINB12, FOLATE, FERRITIN, TIBC, IRON, RETICCTPCT in the last 72 hours. Sepsis Labs: Recent Labs  Lab 09/11/23 1236  LATICACIDVEN 1.6  Recent Results  (from the past 240 hours)  Blood Culture (routine x 2)     Status: None (Preliminary result)   Collection Time: 09/11/23 11:21 AM   Specimen: BLOOD  Result Value Ref Range Status   Specimen Description BLOOD SITE NOT SPECIFIED  Final   Special Requests   Final    BOTTLES DRAWN AEROBIC AND ANAEROBIC Blood Culture adequate volume   Culture   Final    NO GROWTH < 24 HOURS Performed at [facility], [address] [place], [place] [address]    Report Status PENDING  Incomplete  Resp panel by RT-PCR (RSV, Flu A&B, Covid) Anterior Nasal Swab     Status: None   Collection Time: 09/11/23 11:22 AM   Specimen: Anterior Nasal Swab  Result Value Ref Range Status   SARS Coronavirus 2 by RT PCR NEGATIVE NEGATIVE Final   Influenza A by PCR NEGATIVE NEGATIVE Final   Influenza B by PCR NEGATIVE NEGATIVE Final    Comment: (NOTE) The Xpert Xpress SARS-CoV-2/FLU/RSV plus assay is intended as an aid in the diagnosis of influenza from Nasopharyngeal swab specimens and should not be used as a sole basis for treatment. Nasal washings and aspirates are unacceptable for Xpert Xpress SARS-CoV-2/FLU/RSV testing.  Fact Sheet for Patients: BloggerCourse.com  Fact Sheet for Healthcare Providers: SeriousBroker.it  This test is not yet approved or cleared by the [place]  FDA and has been authorized for detection and/or diagnosis of SARS-CoV-2 by FDA under an Emergency Use Authorization (EUA). This EUA will remain in effect (meaning this test can be used) for the duration of the COVID-19 declaration under Section 564(b)(1) of the Act, 21 U.S.C. section 360bbb-3(b)(1), unless the authorization is terminated or revoked.     Resp Syncytial Virus by PCR NEGATIVE NEGATIVE Final    Comment: (NOTE) Fact Sheet for Patients: BloggerCourse.com  Fact Sheet for Healthcare  Providers: SeriousBroker.it  This test is not yet approved or cleared by the [place]  FDA and has been authorized for detection and/or diagnosis of SARS-CoV-2 by FDA under an Emergency Use Authorization (EUA). This EUA will remain in effect (meaning this test can be used) for the duration of the COVID-19 declaration under Section 564(b)(1) of the Act, 21 U.S.C. section 360bbb-3(b)(1), unless the authorization is terminated or revoked.  Performed at [facility], [address] [place], [place] [address]   Blood Culture (routine x 2)     Status: None (Preliminary result)   Collection Time: 09/11/23  1:36 PM   Specimen: BLOOD RIGHT ARM  Result Value Ref Range Status   Specimen Description BLOOD RIGHT ARM  Final   Special Requests   Final    BOTTLES DRAWN AEROBIC AND ANAEROBIC Blood Culture adequate volume   Culture  Setup Time   Final    GRAM NEGATIVE RODS ANAEROBIC BOTTLE ONLY PHARMD JESSICA M. [id number], ADC Performed at [facility], [address], [place], [place] [address]    Culture GRAM NEGATIVE RODS  Final   Report Status PENDING  Incomplete  Blood Culture ID Panel (Reflexed)     Status: None   Collection Time: 09/11/23  1:36 PM  Result Value Ref Range Status   Enterococcus faecalis NOT DETECTED NOT DETECTED Final   Enterococcus Faecium NOT DETECTED NOT DETECTED Final   Listeria monocytogenes NOT DETECTED NOT DETECTED Final   Staphylococcus species NOT DETECTED NOT DETECTED Final   Staphylococcus aureus (BCID) NOT DETECTED NOT DETECTED Final   Staphylococcus  epidermidis NOT DETECTED NOT DETECTED Final   Staphylococcus lugdunensis NOT DETECTED NOT DETECTED Final   Streptococcus species NOT DETECTED NOT DETECTED Final   Streptococcus agalactiae NOT DETECTED NOT DETECTED Final   Streptococcus pneumoniae NOT DETECTED NOT DETECTED Final   Streptococcus pyogenes NOT DETECTED NOT DETECTED Final   A.calcoaceticus-baumannii NOT  DETECTED NOT DETECTED Final   Bacteroides fragilis NOT DETECTED NOT DETECTED Final   Enterobacterales NOT DETECTED NOT DETECTED Final   Enterobacter cloacae complex NOT DETECTED NOT DETECTED Final   Escherichia coli NOT DETECTED NOT DETECTED Final   Klebsiella aerogenes NOT DETECTED NOT DETECTED Final   Klebsiella oxytoca NOT DETECTED NOT DETECTED Final   Klebsiella pneumoniae NOT DETECTED NOT DETECTED Final   Proteus species NOT DETECTED NOT DETECTED Final   Salmonella species NOT DETECTED NOT DETECTED Final   Serratia marcescens NOT DETECTED NOT DETECTED Final   Haemophilus influenzae NOT DETECTED NOT DETECTED Final   Neisseria meningitidis NOT DETECTED NOT DETECTED Final   Pseudomonas aeruginosa NOT DETECTED NOT DETECTED Final   Stenotrophomonas maltophilia NOT DETECTED NOT DETECTED Final   Candida albicans NOT DETECTED NOT DETECTED Final   Candida auris NOT DETECTED NOT DETECTED Final   Candida glabrata NOT DETECTED NOT DETECTED Final   Candida krusei NOT DETECTED NOT DETECTED Final   Candida parapsilosis NOT DETECTED NOT DETECTED Final   Candida tropicalis NOT DETECTED NOT DETECTED Final   Cryptococcus neoformans/gattii NOT DETECTED NOT DETECTED Final    Comment: Performed at [facility], [address], [place], [place] [address]    Radiology Studies: DG Shoulder Left Port Result Date: 09/12/2023 CLINICAL DATA:  Status post reverse shoulder arthroplasty. EXAM: LEFT SHOULDER COMPARISON:  Radiograph 09/02/2023, CT 08/31/2023 FINDINGS: Reverse shoulder arthroplasty in expected alignment. Fracture fragment adjacent to the humeral known into is grossly unchanged from prior. No periprosthetic lucency. Stable acromioclavicular alignment. Resolving soft tissue edema with resolved soft tissue gas. IMPRESSION: Reverse shoulder arthroplasty without complication. Electronically Signed   By: Andrea Gasman M.D.   On: 09/12/2023 14:21   X-ray chest PA and lateral Result Date:  09/12/2023 CLINICAL DATA:  Dyspnea. EXAM: CHEST - 2 VIEW COMPARISON:  09/11/2023 FINDINGS: The lungs are clear without focal pneumonia, edema, pneumothorax or pleural effusion. The cardiopericardial silhouette is within normal limits for size. No acute bony abnormality. Status post left shoulder replacement. Telemetry leads overlie the chest. IMPRESSION: No active cardiopulmonary disease. Electronically Signed   By: Camellia Candle M.D.   On: 09/12/2023 08:29   MR BRAIN WO CONTRAST Result Date: 09/11/2023 CLINICAL DATA:  Neuro deficit, acute, stroke suspected EXAM: MRI HEAD WITHOUT CONTRAST TECHNIQUE: Multiplanar, multiecho pulse sequences of the brain and surrounding structures were obtained without intravenous contrast. COMPARISON:  CT of the head dated September 10, 2023. FINDINGS: Brain: There is no restricted diffusion to indicate acute or recent infarction. There is minimal subcortical white matter disease present. No evidence of hemorrhage, mass or hydrocephalus. Vascular: Normal flow voids. Skull and upper cervical spine: Normal marrow signal. Sinuses/Orbits: Mild mucosal disease within the left maxillary sinus. Status post bilateral lens replacement. Other: None. IMPRESSION: 1. Minimal nonspecific subcortical white matter disease. Otherwise, normal brain. 2. Mild left maxillary sinus disease. Electronically Signed   By: Evalene Coho M.D.   On: 09/11/2023 18:36   CT Angio Chest PE W and/or Wo Contrast Result Date: 09/11/2023 CLINICAL DATA:  Dizziness, pulmonary embolism suspected, positive D-dimer EXAM: CT ANGIOGRAPHY CHEST WITH CONTRAST TECHNIQUE: Multidetector CT imaging of the chest was performed  using the standard protocol during bolus administration of intravenous contrast. Multiplanar CT image reconstructions and MIPs were obtained to evaluate the vascular anatomy. RADIATION DOSE REDUCTION: This exam was performed according to the departmental dose-optimization program which includes automated  exposure control, adjustment of the mA and/or kV according to patient size and/or use of iterative reconstruction technique. CONTRAST:  75mL OMNIPAQUE  IOHEXOL  350 MG/ML SOLN COMPARISON:  Same day chest radiograph and prior studies FINDINGS: Cardiovascular: Satisfactory opacification of the pulmonary arteries to the segmental level. No evidence of pulmonary embolism. Unchanged cardiomegaly. Mildly enlarged ascending thoracic aorta, measuring up to 4.2 cm. Left atrial appendage occlusion device. No pericardial effusion. Mediastinum/Nodes: No lymphadenopathy. Lungs/Pleura: Emphysema. Stable tiny bilateral pulmonary nodules measuring up to 0.2 cm. No focal consolidation. Accessory azygous lobe with azygous vein traversing through the azygous fissure. No pleural effusion or pneumothorax. Upper Abdomen: No acute abnormality. Musculoskeletal: No acute osseous findings. Multilevel intervertebral changes with anterolisthesis of C7 on T1. Review of the MIP images confirms the above findings. IMPRESSION: 1. No pulmonary embolism identified. 2. Mildly enlarged ascending thoracic aorta measuring up to 4.2 cm. Emphysema (ICD10-J43.9). Electronically Signed   By: Michaeline Blanch M.D.   On: 09/11/2023 14:47   DG Chest Port 1 View Result Date: 09/11/2023 CLINICAL DATA:  Questionable sepsis - evaluate for abnormality EXAM: PORTABLE CHEST 1 VIEW COMPARISON:  August 07, 2021 FINDINGS: The cardiomediastinal silhouette is unchanged in contour.Watchman device. No pleural effusion. No pneumothorax. Azygous fissure. No acute pleuroparenchymal abnormality. Status post LEFT shoulder arthroplasty IMPRESSION: No acute cardiopulmonary abnormality. Electronically Signed   By: Corean Salter M.D.   On: 09/11/2023 12:32   Scheduled Meds:  amLODipine   10 mg Oral QHS   amoxicillin -clavulanate  1 tablet Oral Q12H   ascorbic acid   500 mg Oral Daily   aspirin  EC  81 mg Oral Daily   B-complex with vitamin C   1 tablet Oral Daily   calcium   carbonate  1 tablet Oral Q breakfast   carvedilol   6.25 mg Oral BID WC   cholecalciferol   2,000 Units Oral Daily   cyanocobalamin   1,000 mcg Intramuscular Q Sun   enoxaparin  (LOVENOX ) injection  40 mg Subcutaneous Q24H   feeding supplement  237 mL Oral BID BM   FLUoxetine   40 mg Oral Daily   fluticasone   1 spray Each Nare Daily   gabapentin   300 mg Oral Daily   And   gabapentin   600 mg Oral QHS   guaiFENesin   600 mg Oral BID   latanoprost   1 drop Both Eyes [id number]   loratadine   10 mg Oral Daily   methylphenidate   5 mg Oral Daily   multivitamin with minerals  1 tablet Oral Daily   omega-3 acid ethyl esters  1 g Oral Daily   pantoprazole   40 mg Oral Daily   rosuvastatin   20 mg Oral Daily   sodium bicarbonate   650 mg Oral BID   Continuous Infusions:   LOS: 0 days   Alejandro Marker, DO [facility] Available via Epic secure chat 7am-7pm After these hours, please refer to coverage provider listed on [email or web address] 09/12/2023, 9:51 PM

## 2023-09-12 NOTE — Care Management Obs Status (Addendum)
 MEDICARE OBSERVATION STATUS NOTIFICATION   Patient Details  Name: Michelle Johnston MRN: [id number] Date of Birth: 06-24-1950   Medicare Observation Status Notification Given:  Yes ICharlann Rayfield Hurst, RN, verbally reviewed observation notice.  09/12/2023, 3:20 PM     Charlann Rayfield Hurst, RN 09/12/2023, 3:10 PM

## 2023-09-12 NOTE — Evaluation (Addendum)
 Occupational Therapy Evaluation Patient Details Name: Michelle Johnston MRN: [id number] DOB: 05-23-1950 Today's Date: 09/12/2023   History of Present Illness   Pt is a 73 y/o female presenting with dizziness and dyspnea. Found to have hypokalemia, hyponatremia and elevated anion gap metabolic acidosis. CT head negative, negative for PE. THC+. 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 Since surgery, pt reports intermittent light assist with ADLs, IADLs and stair mgmt as needed. Pt also reports using cane vs RW for mobility d/t feeling off balance. Pt reports unaware of any L shoulder restrictions aside from sling wear (asked pt to have spouse bring sling from home) and OT unable to successfully contact surgeon at time of eval for further L shoulder recommendations. Pt able to manage bathroom mobility without physical assistance and no more than Min A needed for ADLs. Pt denied dizziness or SOB. Pt functionally appropriate for DC home once medically cleared. Recommend OP therapy follow up per surgeon's shoulder protocol.   Update from ortho PA: Patient should continue to be non weight bearing with left arm. In sling at all times. No ROM at left shoulder. Ok for AROM at left elbow, wrist, and fingers.      If plan is discharge home, recommend the following:   A little help with bathing/dressing/bathroom;Assistance with cooking/housework;Assist for transportation;Help with stairs or ramp for entrance     Functional Status Assessment   Patient has had a recent decline in their functional status and demonstrates the ability to make significant improvements in function in a reasonable and predictable amount of time.     Equipment Recommendations   None recommended by OT     Recommendations for  Other Services         Precautions/Restrictions   Precautions Precautions: Fall;Shoulder Shoulder Interventions: Shoulder sling/immobilizer Precaution/Restrictions Comments: pt does not have sling with her at time of OT eval; plans to ask husband to bring her sling from home. Required Braces or Orthoses: Sling Restrictions Weight Bearing Restrictions Per Provider Order: Yes LUE Weight Bearing Per Provider Order: Non weight bearing Other Position/Activity Restrictions: NWB per op note 7/3     Mobility Bed Mobility Overal bed mobility: Needs Assistance Bed Mobility: Supine to Sit, Sit to Supine     Supine to sit: Min assist Sit to supine: Contact guard assist   General bed mobility comments: Min A to place LUE in lap to protect with bed mobility, handheld assist with RUE to lift trunk    Transfers Overall transfer level: Needs assistance Equipment used: None Transfers: Sit to/from Stand Sit to Stand: Supervision                  Balance Overall balance assessment: No apparent balance deficits (not formally assessed)                                         ADL either performed or assessed with clinical judgement   ADL Overall ADL's : Needs assistance/impaired Eating/Feeding: Independent   Grooming: Set up;Standing   Upper Body Bathing: Minimal assistance;Sitting   Lower Body Bathing: Set up;Supervison/ safety;Sitting/lateral leans;Sit to/from stand   Upper Body Dressing : Minimal assistance;Sitting   Lower Body Dressing: Minimal assistance;Sitting/lateral leans;Sit  to/from stand Lower Body Dressing Details (indicate cue type and reason): assist for socks Toilet Transfer: Contact guard assist;Ambulation;Regular Toilet;Rolling walker (2 wheels) Toilet Transfer Details (indicate cue type and reason): pt reports using RW since sx, opted to use it for bathroom mobility. OT cueing to avoid excessive WB Toileting- Clothing Manipulation and  Hygiene: Supervision/safety;Sit to/from stand;Sitting/lateral lean               Vision Baseline Vision/History: 1 Wears glasses Ability to See in Adequate Light: 0 Adequate Patient Visual Report: No change from baseline Vision Assessment?: No apparent visual deficits     Perception         Praxis         Pertinent Vitals/Pain Pain Assessment Pain Assessment: Faces Faces Pain Scale: Hurts little more Pain Location: L shoulder Pain Descriptors / Indicators: Grimacing, Guarding Pain Intervention(s): Monitored during session, Repositioned     Extremity/Trunk Assessment Upper Extremity Assessment Upper Extremity Assessment: Right hand dominant;LUE deficits/detail LUE Deficits / Details: reverse TSA 7/3. bruising throughout UE   Lower Extremity Assessment Lower Extremity Assessment: Defer to PT evaluation   Cervical / Trunk Assessment Cervical / Trunk Assessment: Normal   Communication Communication Communication: No apparent difficulties   Cognition Arousal: Alert Behavior During Therapy: WFL for tasks assessed/performed Cognition: No apparent impairments                               Following commands: Intact       Cueing  General Comments   Cueing Techniques: Verbal cues  Attempted to reach out to ortho surgeon regarding L shoulder restrictions though unable to secure chat due to offline status.   Exercises     Shoulder Instructions      Home Living Family/patient expects to be discharged to:: Private residence Living Arrangements: Spouse/significant other Available Help at Discharge: Family;Available 24 hours/day Type of Home: House Home Access: Stairs to enter [employer] of Steps: 2   Home Layout: Two level Alternate Level Stairs-Number of Steps: flight   Bathroom Shower/Tub: Producer, television/film/video: Standard     Home Equipment: Agricultural consultant (2 wheels);Cane - single point;Shower seat           Prior Functioning/Environment Prior Level of Function : History of Falls (last six months);Independent/Modified Independent             Mobility Comments: reports typically no AD prior to fall/surgery. Since surgery, pt reports using cane vs RW (OT inquired about potential restrictions s/p TSA) though pt reports not aware of restrictions for RW use ADLs Comments: Mod I for ADLs. husband walks behind pt up stairs to bedroom/bathroom to ensure safety. Previously managing IADLs but has not been much recently since sx.    OT Problem List: Decreased activity tolerance;Impaired UE functional use;Pain;Decreased knowledge of use of DME or AE;Decreased knowledge of precautions   OT Treatment/Interventions: Self-care/ADL training;Therapeutic exercise;Energy conservation;DME and/or AE instruction;Therapeutic activities;Patient/family education;Balance training      OT Goals(Current goals can be found in the care plan section)   Acute Rehab OT Goals Patient Stated Goal: resolve dizziness, be able to reschedule follow up appt with surgeon OT Goal Formulation: With patient Time For Goal Achievement: 09/26/23 Potential to Achieve Goals: Good ADL Goals Pt Will Perform Upper Body Dressing: with modified independence;sitting Pt Will Perform Lower Body Dressing: with modified independence;sitting/lateral leans;sit to/from stand Additional ADL Goal #1: Pt to don L sling independently  OT Frequency:  Min 2X/week    Co-evaluation              AM-PAC OT 6 Clicks Daily Activity     Outcome Measure Help from another person eating meals?: None Help from another person taking care of personal grooming?: A Little Help from another person toileting, which includes using toliet, bedpan, or urinal?: A Little Help from another person bathing (including washing, rinsing, drying)?: A Little Help from another person to put on and taking off regular upper body clothing?: A Little Help from another  person to put on and taking off regular lower body clothing?: A Little 6 Click Score: 19   End of Session Nurse Communication: Mobility status  Activity Tolerance: Patient tolerated treatment well Patient left: in bed;with call bell/phone within reach;with bed alarm set;with nursing/sitter in room  OT Visit Diagnosis: Muscle weakness (generalized) (M62.81);Other abnormalities of gait and mobility (R26.89);History of falling (Z91.81)                Time: 9276-9261 OT Time Calculation (min): 15 min Charges:  OT General Charges $OT Visit: 1 Visit OT Evaluation $OT Eval Low Complexity: 1 Low  Mliss NOVAK, OTR/L Acute Rehab Services Office: [phone]   Mliss Fish 09/12/2023, 7:54 AM

## 2023-09-12 NOTE — Hospital Course (Signed)
 Michelle Johnston is a 73 y.o. female with medical history significant of significant for but not limited to history of atrial fibrillation status post ablation and Watchman, depression, anxiety, GERD, essential hypertension, sleep apnea noncompliant with CPAP, history of bradycardia, glaucoma and other comorbidities who presented to the hospital with dizziness and dyspnea.  Patient states that she was dizzy a few days ago and presented to the ED yesterday and given IV fluids and discharged home.  Subsequently she woke up this morning and was persistently dizzy and felt as if she is going to pass out but did not actually pass out.  Had a fall a few weeks ago and had to have shoulder surgery last week.  Since her surgery her appetite has not been the best and symptoms have started worsening.  This morning when she woke up she felt dizzy but also had associated dyspnea.  States that since her shoulder surgery last week her appetite has not been the best but she has been eating.  States that she may have had 1 episode of vomiting but no real nausea.  States that she also had some diarrhea for about a day.  Denies any chest discomfort but did state that she feels dizzy still little bit as and had some dyspnea.  She denied any burning or discomfort in her urine or any other symptoms.  TRH was asked to admit this patient for further evaluation of her dizziness and presyncopal episode along with her associated dyspnea.   **Interim Hx: Syncope workup has been completed and she will be discharged home however prior to discharge her blood cultures turned positive for a gram-negative bacteremia.  She has been placed on oral Augmentin  and will need to await cultures and repeat blood cultures in a.m. Discussed with infectious diseases and they recommend escalating antibiotics and doing further workup with CT scan of the abdomen pelvis as well as checking GI pathogen panel and C. difficile.  Assessment and Plan:  Dizziness and  near syncope: Admit to the telemetry unit and placed in observation.  Give IV fluid hydration.  Check orthostatic vital signs.  Has had persistent dizziness the last few days and was discharged home after fluids yesterday but dizziness persisted and she was near syncopal.  Checked troponins and they were not elevated.  Evaluated for PE and this was ruled out.  Head CT done yesterday and was unremarkable we will get an MRI of the brain for further evaluation and showed Minimal nonspecific subcortical white matter disease. Otherwise, normal brain.Mild left maxillary sinus disease.. -Recent echo was done earlier this month.  THC was positive on UDS.  Blood cultures x 2 POSITVE for GNR BACTEREMIA.  Checked TSH and was 2.151.  UA not really indicative of a urinary tract infection.  PT OT to further evaluate as well as a vestibular evaluation and are recommending HH and Vestibular Evaluation done and they feel like she had a hypofunction of the Vestibular if no medical issue was found for dizziness.  - Was a little dizzy this morning but in the setting of nausea.  GNR Bacteremia: + Cx w/ unidentified Bacteria.  Repeating cultures currently pending started Augmentin  but after discussion with infectious diseases we will escalate to IV Unasyn .  Patient started having some diarrhea so we will check a GI pathogen panel as well as C. difficile.  She was nauseous this a.m. given her symptoms we will also get a CT scan of the abdomen pelvis and showed No acute inflammatory  process identified within the abdomen or pelvis.. Repeat Blood Cx x2 in the AM. D/w ID given unidentified Bacteria on Biofire.  Continue supportive care continue with IV fluid hydration and antiemetics.  Maxillary Sinus Disease: Flonase . Augmentin  discontinued and she has been started on IV Unasyn  as above   Dyspnea: Improved . Had associated dyspnea the morning of Admission and was ruled out for PE even though that her D-dimer was elevated.  Continue  monitor and give IV fluid hydration. Repeat chest x-ray in this AM showed no acute cardiopulmonary disease. Flutter Valve, Incentive Spirometry, and Guaifenesin  600 mg intiated along with Xopenex  [id number]  Recent Shoulder Surgery: Ortho was contacted and DG Shoulder done and showed Reverse shoulder arthroplasty without complication. She is to have NO ROM at L Shoulder and NWB w/ Left arm and in Sling all time. She is ok of AROM at L elbow base, wrist and fingers, and recommending not bearing weight on operative arm if she can walk with a cane in R hand.  Orthopedic surgery recommending reinforcing dressings as needed and following up with orthopedic surgery within 1 week.   Hypokalemia: Potassium had been low at 2.9 but improved to 4.1 yesterday and now is 3.9.  Was not able to tolerate IV potassium.  Checked magnesium  level and was 1.8 so we will replete with IV mag sulfate 2 g.   Hyponatremia: Patient's Na+ went from 131 -> 132 -> 131 -> 128.  Continue IV fluid hydration as above and changed to normal saline at 75 mL/h.  Continue monitor and and trend and repeat CMP in a.m.   Elevated anion gap metabolic acidosis: Question if this is in the setting of her poor p.o. intake.  States that she vomited once and has been having some diarrhea but only lasted for a day.  If she continues to have persistent diarrhea we will check her stool for infection.  Continue IV fluid hydration as above and MA is improving.  Now has resolved and improved and CO2 is 22, anion gap is 9, chloride level is 97  History of A-fib: Currently in sinus rhythm.  Continue with Carvedilol  6.25 mg po BID.  Continue monitor blood pressures per protocol   Normocytic/Macrocytic anemia: Hemoglobin/hematocrit went from 10.3/29.1 -> 10.4/29.7 -> 10.9/31.7 -> 10.8/31.3.  Checked Anemia Panel and showed an iron level of 44, UIBC of 299, TIBC of 343, saturation ratios of 13%, ferritin level 144, folate level 34.7 and a vitamin B12 level 3160.SABRA   Continue monitor for signs and symptoms bleeding; no overt bleeding noted.  Repeat CBC in a.m.   Hypoalbuminemia: Patient's Albumin Trend: Recent Labs  Lab 09/10/23 2002 09/11/23 1146 09/12/23 0816 09/13/23 0407  ALBUMIN 3.0* 3.2* 3.4* 3.2*  -Continue to Monitor and Trend and repeat CMP in the AM   GERD/GI prophylaxis: Continue with PPI with Pantoprazole  40 mg p.o. daily

## 2023-09-12 NOTE — Progress Notes (Addendum)
 PT  Note  Patient Details Name: Michelle Johnston MRN: [id number] DOB: 1950/09/04   Cancelled Treatment:   Other (comment)  Contacted Dr. Bonner Lefevre office to get clarification on any weight bearing or motion restrictions pt needs to follow post L Reverse TSA done by Dr. Cristy on 09/02/2023; Left a message for on call provider;   8:30am:  Spoke with Ortho on-call provider, Army Daring, PA, who will contact Dr. Lefevre PA and plans to round on Ms. Scrivens today; per Army, I anticipate pt will be LUE NWB and in a sling; will need a unilateral assistive device for ambulation.  Silvano Currier, PT  Acute Rehabilitation Services Office [phone] Secure Chat welcomed    Silvano VEAR Currier 09/12/2023, 8:20 AM

## 2023-09-12 NOTE — Progress Notes (Signed)
 Received patient from previous nurse. Patient resting in bed. Patient not in acute distress. Safety measures in place. Patient able to ambulate to bathroom with one person assist. Will continue to follow plan of care.

## 2023-09-12 NOTE — Evaluation (Signed)
 Physical Therapy Evaluation Patient Details Name: Michelle Johnston MRN: [id number] DOB: Dec 07, 1950 Today's Date: 09/12/2023  History of Present Illness  Pt is a 73 y/o female presenting with dizziness and dyspnea. Found to have hypokalemia, hyponatremia and elevated anion gap metabolic acidosis. CT head negative, negative for PE. THC+. 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 Currently pt is Min A for bed mobility, sit to stand and short distance gait with HHA. 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 Pt spouse was present and supportive. Due to pt current functional status, home set up and available assistance at home recommending skilled physical therapy services 3x/week in order to address strength, balance and functional mobility to decrease risk for falls, injury and re-hospitalization.           If plan is discharge home, recommend the following: A little help with walking and/or transfers;Help with stairs or ramp for entrance;Assistance with cooking/housework;Assist for transportation     Equipment Recommendations None recommended by PT     Functional Status Assessment Patient has had a recent decline in their functional status and demonstrates the ability to make significant improvements in function in a reasonable and predictable amount of time.     Precautions / Restrictions Precautions Precautions: Fall;Shoulder Shoulder Interventions: Shoulder sling/immobilizer Precaution/Restrictions Comments: pt does not have sling with her at time of OT eval; plans to ask husband to bring her sling from home. Required Braces or Orthoses: Sling Restrictions Weight Bearing Restrictions Per Provider Order: Yes LUE Weight Bearing Per Provider Order:  Non weight bearing Other Position/Activity Restrictions: NWB per op note 7/3      Mobility  Bed Mobility Overal bed mobility: Needs Assistance Bed Mobility: Supine to Sit, Sit to Supine     Supine to sit: Min assist Sit to supine: Contact guard assist   General bed mobility comments: Min A to place LUE in lap to protect with bed mobility, handheld assist with RUE to lift trunk    Transfers Overall transfer level: Needs assistance Equipment used: 1 person hand held assist Transfers: Sit to/from Stand Sit to Stand: Contact guard assist                Ambulation/Gait Ambulation/Gait assistance: Contact guard assist Gait Distance (Feet): 80 Feet Assistive device: 1 person hand held assist Gait Pattern/deviations: Antalgic, Step-to pattern, Decreased stance time - right Gait velocity: decreased Gait velocity interpretation: <1.31 ft/sec, indicative of household ambulator   General Gait Details: antalgic gait with frontal sway, short step to gait pattern with decreased stance time on RLE. O2 sats remained 96% on room air during gait.     Balance Overall balance assessment: Needs assistance Sitting-balance support: Single extremity supported Sitting balance-Leahy Scale: Good     Standing balance support: Single extremity supported, During functional activity Standing balance-Leahy Scale: Poor Standing balance comment: reliant on UE support for balance.         Pertinent Vitals/Pain Pain Assessment Pain Assessment: Faces Faces Pain Scale: Hurts little more Pain Location: L shoulder Pain Descriptors / Indicators: Grimacing, Guarding Pain Intervention(s): Monitored during session, Limited activity within patient's tolerance    Home Living Family/patient expects to be discharged to:: Private residence Living Arrangements: Spouse/significant other Available Help at Discharge: Family;Available 24 hours/day Type of Home: House  Home Access: Stairs to enter    Entrance Stairs-Number of Steps: 2 Alternate Level Stairs-Number of Steps: flight Home Layout: Two level Home Equipment: Agricultural consultant (2 wheels);Cane - single point;Shower seat Additional Comments: pt reports she has been seeing an MD for tears in her R glute muscle as well as dead muscle tissue    Prior Function Prior Level of Function : History of Falls (last six months);Independent/Modified Independent             Mobility Comments: reports typically no AD prior to fall/surgery. Since surgery, pt reports using cane vs RW (OT inquired about potential restrictions s/p TSA) though pt reports not aware of restrictions for RW use ADLs Comments: Mod I for ADLs. husband walks behind pt up stairs to bedroom/bathroom to ensure safety. Previously managing IADLs but has not been much recently since sx.     Extremity/Trunk Assessment   Upper Extremity Assessment Upper Extremity Assessment: Defer to OT evaluation LUE Deficits / Details: reverse TSA 7/3. bruising throughout UE    Lower Extremity Assessment Lower Extremity Assessment: RLE deficits/detail RLE Deficits / Details: decreased WB on the RLE with antalgic gait and generalized weakness throughout    Cervical / Trunk Assessment Cervical / Trunk Assessment: Normal  Communication   Communication Communication: No apparent difficulties    Cognition Arousal: Alert Behavior During Therapy: WFL for tasks assessed/performed       Following commands: Intact       Cueing Cueing Techniques: Verbal cues     General Comments General comments (skin integrity, edema, etc.): Pt NWB on the LUE. Pt orthostatics negative.        Assessment/Plan    PT Assessment Patient needs continued PT services  PT Problem List Decreased strength;Decreased activity tolerance;Decreased balance;Decreased mobility;Pain       PT Treatment Interventions DME instruction;Balance training;Gait training;Stair training;Functional mobility  training;Therapeutic activities;Therapeutic exercise;Patient/family education    PT Goals (Current goals can be found in the Care Plan section)  Acute Rehab PT Goals Patient Stated Goal: to improve mobility and decrease pain PT Goal Formulation: With patient Time For Goal Achievement: 09/26/23 Potential to Achieve Goals: Good    Frequency Min 2X/week        AM-PAC PT 6 Clicks Mobility  Outcome Measure Help needed turning from your back to your side while in a flat bed without using bedrails?: A Little Help needed moving from lying on your back to sitting on the side of a flat bed without using bedrails?: A Little Help needed moving to and from a bed to a chair (including a wheelchair)?: A Little Help needed standing up from a chair using your arms (e.g., wheelchair or bedside chair)?: A Little Help needed to walk in hospital room?: A Little Help needed climbing 3-5 steps with a railing? : A Lot 6 Click Score: 17    End of Session Equipment Utilized During Treatment: Gait belt Activity Tolerance: Patient tolerated treatment well;Patient limited by pain;Patient limited by fatigue Patient left: in bed;with call bell/phone within reach;with family/visitor present;with bed alarm set Nurse Communication: Mobility status PT Visit Diagnosis: Unsteadiness on feet (R26.81);Other abnormalities of gait and mobility (R26.89);Muscle weakness (generalized) (M62.81);Pain Pain - Right/Left: Right Pain - part of body: Shoulder;Hip    Time: 8574-8540 PT Time Calculation (min) (ACUTE ONLY): 34 min   Charges:   PT Evaluation $PT Eval Low Complexity: 1 Low PT Treatments $Therapeutic Activity: 8-22 mins PT General Charges $$ ACUTE PT VISIT: 1 Visit  Dorothyann Maier, DPT, CLT  Acute Rehabilitation Services Office: [phone] (Secure chat preferred)   Dorothyann VEAR Maier 09/12/2023, 3:24 PM

## 2023-09-12 NOTE — Progress Notes (Addendum)
 Patient seen and examined. Overall doing well, had a fair amount of drainage on Aquacel dressing, so I have changed this today. She states she doesn't normally use an assistive device but has been feeling weaker through her legs recently so really hasn't been using the sling and has been fully weight bearing through her operative arm with a walker. She has hip and knee issues that she has seen Dr. Vernetta for in the past.   Discussed case with Dr. Raguel team. Patient should continue to be non weight bearing with left arm. In sling at all times. No ROM at left shoulder. Ok for AROM at left elbow, wrist, and fingers. We were not aware that patient used a walker at baseline at all and would prefer for her not to weight bear with operative arm if she can walk with a cane in right hand.   Army Daring, PA-C

## 2023-09-12 NOTE — Progress Notes (Signed)
 PHARMACY - PHYSICIAN COMMUNICATION CRITICAL VALUE ALERT - BLOOD CULTURE IDENTIFICATION (BCID)  Michelle ECHEVARRIA is an 73 y.o. female who presented to [facility] on 09/11/2023 with a chief complaint of dizziness and dyspnea.  Assessment: Bcx with GNR in 1/4 bottles and BCID shows no results.  Name of physician (or Provider) Contacted: Dr. Sherrill  Current antibiotics: Augmentin   Changes to prescribed antibiotics recommended:  Patient is on recommended antibiotics - No changes needed  Results for orders placed or performed during the hospital encounter of 09/11/23  Blood Culture ID Panel (Reflexed) (Collected: 09/11/2023  1:36 PM)  Result Value Ref Range   Enterococcus faecalis NOT DETECTED NOT DETECTED   Enterococcus Faecium NOT DETECTED NOT DETECTED   Listeria monocytogenes NOT DETECTED NOT DETECTED   Staphylococcus species NOT DETECTED NOT DETECTED   Staphylococcus aureus (BCID) NOT DETECTED NOT DETECTED   Staphylococcus epidermidis NOT DETECTED NOT DETECTED   Staphylococcus lugdunensis NOT DETECTED NOT DETECTED   Streptococcus species NOT DETECTED NOT DETECTED   Streptococcus agalactiae NOT DETECTED NOT DETECTED   Streptococcus pneumoniae NOT DETECTED NOT DETECTED   Streptococcus pyogenes NOT DETECTED NOT DETECTED   A.calcoaceticus-baumannii NOT DETECTED NOT DETECTED   Bacteroides fragilis NOT DETECTED NOT DETECTED   Enterobacterales NOT DETECTED NOT DETECTED   Enterobacter cloacae complex NOT DETECTED NOT DETECTED   Escherichia coli NOT DETECTED NOT DETECTED   Klebsiella aerogenes NOT DETECTED NOT DETECTED   Klebsiella oxytoca NOT DETECTED NOT DETECTED   Klebsiella pneumoniae NOT DETECTED NOT DETECTED   Proteus species NOT DETECTED NOT DETECTED   Salmonella species NOT DETECTED NOT DETECTED   Serratia marcescens NOT DETECTED NOT DETECTED   Haemophilus influenzae NOT DETECTED NOT DETECTED   Neisseria meningitidis NOT DETECTED NOT DETECTED   Pseudomonas aeruginosa NOT DETECTED NOT  DETECTED   Stenotrophomonas maltophilia NOT DETECTED NOT DETECTED   Candida albicans NOT DETECTED NOT DETECTED   Candida auris NOT DETECTED NOT DETECTED   Candida glabrata NOT DETECTED NOT DETECTED   Candida krusei NOT DETECTED NOT DETECTED   Candida parapsilosis NOT DETECTED NOT DETECTED   Candida tropicalis NOT DETECTED NOT DETECTED   Cryptococcus neoformans/gattii NOT DETECTED NOT DETECTED   Maurilio Patten, PharmD PGY1 Pharmacy Resident [facility]  09/12/2023 1:07 PM

## 2023-09-12 NOTE — Plan of Care (Signed)

## 2023-09-13 ENCOUNTER — Encounter

## 2023-09-13 ENCOUNTER — Inpatient Hospital Stay (HOSPITAL_COMMUNITY)

## 2023-09-13 DIAGNOSIS — I10 Essential (primary) hypertension: Secondary | ICD-10-CM | POA: Diagnosis present

## 2023-09-13 DIAGNOSIS — Z96652 Presence of left artificial knee joint: Secondary | ICD-10-CM | POA: Diagnosis present

## 2023-09-13 DIAGNOSIS — F419 Anxiety disorder, unspecified: Secondary | ICD-10-CM | POA: Diagnosis present

## 2023-09-13 DIAGNOSIS — K219 Gastro-esophageal reflux disease without esophagitis: Secondary | ICD-10-CM | POA: Diagnosis present

## 2023-09-13 DIAGNOSIS — Z96612 Presence of left artificial shoulder joint: Secondary | ICD-10-CM | POA: Diagnosis present

## 2023-09-13 DIAGNOSIS — G4730 Sleep apnea, unspecified: Secondary | ICD-10-CM | POA: Diagnosis present

## 2023-09-13 DIAGNOSIS — Z9181 History of falling: Secondary | ICD-10-CM | POA: Diagnosis not present

## 2023-09-13 DIAGNOSIS — E871 Hypo-osmolality and hyponatremia: Secondary | ICD-10-CM | POA: Diagnosis present

## 2023-09-13 DIAGNOSIS — R110 Nausea: Secondary | ICD-10-CM

## 2023-09-13 DIAGNOSIS — J439 Emphysema, unspecified: Secondary | ICD-10-CM | POA: Diagnosis present

## 2023-09-13 DIAGNOSIS — Z1152 Encounter for screening for COVID-19: Secondary | ICD-10-CM | POA: Diagnosis not present

## 2023-09-13 DIAGNOSIS — R55 Syncope and collapse: Secondary | ICD-10-CM | POA: Diagnosis present

## 2023-09-13 DIAGNOSIS — E876 Hypokalemia: Secondary | ICD-10-CM | POA: Diagnosis present

## 2023-09-13 DIAGNOSIS — E8809 Other disorders of plasma-protein metabolism, not elsewhere classified: Secondary | ICD-10-CM | POA: Diagnosis present

## 2023-09-13 DIAGNOSIS — H409 Unspecified glaucoma: Secondary | ICD-10-CM | POA: Diagnosis present

## 2023-09-13 DIAGNOSIS — R42 Dizziness and giddiness: Secondary | ICD-10-CM | POA: Diagnosis present

## 2023-09-13 DIAGNOSIS — D539 Nutritional anemia, unspecified: Secondary | ICD-10-CM | POA: Diagnosis present

## 2023-09-13 DIAGNOSIS — F32A Depression, unspecified: Secondary | ICD-10-CM | POA: Diagnosis present

## 2023-09-13 DIAGNOSIS — E860 Dehydration: Secondary | ICD-10-CM | POA: Diagnosis present

## 2023-09-13 DIAGNOSIS — Z471 Aftercare following joint replacement surgery: Secondary | ICD-10-CM | POA: Diagnosis not present

## 2023-09-13 DIAGNOSIS — I480 Paroxysmal atrial fibrillation: Secondary | ICD-10-CM | POA: Diagnosis present

## 2023-09-13 DIAGNOSIS — R7881 Bacteremia: Secondary | ICD-10-CM | POA: Diagnosis present

## 2023-09-13 DIAGNOSIS — R296 Repeated falls: Secondary | ICD-10-CM | POA: Diagnosis present

## 2023-09-13 DIAGNOSIS — J320 Chronic maxillary sinusitis: Secondary | ICD-10-CM | POA: Diagnosis present

## 2023-09-13 DIAGNOSIS — E872 Acidosis, unspecified: Secondary | ICD-10-CM | POA: Diagnosis present

## 2023-09-13 DIAGNOSIS — R197 Diarrhea, unspecified: Secondary | ICD-10-CM | POA: Diagnosis not present

## 2023-09-13 LAB — CBC WITH DIFFERENTIAL/PLATELET
Abs Immature Granulocytes: 0.09 K/uL — ABNORMAL HIGH (ref 0.00–0.07)
Basophils Absolute: 0 K/uL (ref 0.0–0.1)
Basophils Relative: 1 %
Eosinophils Absolute: 0.2 K/uL (ref 0.0–0.5)
Eosinophils Relative: 4 %
HCT: 31.3 % — ABNORMAL LOW (ref 36.0–46.0)
Hemoglobin: 10.8 g/dL — ABNORMAL LOW (ref 12.0–15.0)
Immature Granulocytes: 1 %
Lymphocytes Relative: 19 %
Lymphs Abs: 1.3 K/uL (ref 0.7–4.0)
MCH: 34.8 pg — ABNORMAL HIGH (ref 26.0–34.0)
MCHC: 34.5 g/dL (ref 30.0–36.0)
MCV: 101 fL — ABNORMAL HIGH (ref 80.0–100.0)
Monocytes Absolute: 0.5 K/uL (ref 0.1–1.0)
Monocytes Relative: 8 %
Neutro Abs: 4.4 K/uL (ref 1.7–7.7)
Neutrophils Relative %: 67 %
Platelets: 411 K/uL — ABNORMAL HIGH (ref 150–400)
RBC: 3.1 MIL/uL — ABNORMAL LOW (ref 3.87–5.11)
RDW: 12.8 % (ref 11.5–15.5)
WBC: 6.5 K/uL (ref 4.0–10.5)
nRBC: 0 % (ref 0.0–0.2)

## 2023-09-13 LAB — FERRITIN: Ferritin: 144 ng/mL (ref 11–307)

## 2023-09-13 LAB — HEMOGLOBIN A1C
Hgb A1c MFr Bld: 5 % (ref 4.8–5.6)
Mean Plasma Glucose: 97 mg/dL

## 2023-09-13 LAB — FOLATE: Folate: 34.7 ng/mL (ref >5.9)

## 2023-09-13 LAB — RETICULOCYTES
Immature Retic Fract: 13.9 % (ref 2.3–15.9)
RBC.: 3.1 MIL/uL — ABNORMAL LOW (ref 3.87–5.11)
Retic Count, Absolute: 111.6 K/uL (ref 19.0–186.0)
Retic Ct Pct: 3.6 % — ABNORMAL HIGH (ref 0.4–3.1)

## 2023-09-13 LAB — COMPREHENSIVE METABOLIC PANEL WITH GFR
ALT: 29 U/L (ref 0–44)
AST: 26 U/L (ref 15–41)
Albumin: 3.2 g/dL — ABNORMAL LOW (ref 3.5–5.0)
Alkaline Phosphatase: 89 U/L (ref 38–126)
Anion gap: 9 (ref 5–15)
BUN: 5 mg/dL — ABNORMAL LOW (ref 8–23)
CO2: 22 mmol/L (ref 22–32)
Calcium: 9.1 mg/dL (ref 8.9–10.3)
Chloride: 97 mmol/L — ABNORMAL LOW (ref 98–111)
Creatinine, Ser: 0.4 mg/dL — ABNORMAL LOW (ref 0.44–1.00)
GFR, Estimated: >60 mL/min (ref >60)
Glucose, Bld: 109 mg/dL — ABNORMAL HIGH (ref 70–99)
Potassium: 3.9 mmol/L (ref 3.5–5.1)
Sodium: 128 mmol/L — ABNORMAL LOW (ref 135–145)
Total Bilirubin: 0.8 mg/dL (ref 0.0–1.2)
Total Protein: 6.2 g/dL — ABNORMAL LOW (ref 6.5–8.1)

## 2023-09-13 LAB — IRON AND TIBC
Iron: 44 ug/dL (ref 28–170)
Saturation Ratios: 13 % (ref 10.4–31.8)
TIBC: 343 ug/dL (ref 250–450)
UIBC: 299 ug/dL

## 2023-09-13 LAB — PHOSPHORUS: Phosphorus: 3.3 mg/dL (ref 2.5–4.6)

## 2023-09-13 LAB — VITAMIN B12: Vitamin B-12: 3160 pg/mL — ABNORMAL HIGH (ref 180–914)

## 2023-09-13 LAB — MAGNESIUM: Magnesium: 1.8 mg/dL (ref 1.7–2.4)

## 2023-09-13 MED ORDER — SODIUM CHLORIDE 0.9 % IV SOLN: INTRAVENOUS | Status: DC

## 2023-09-13 MED ORDER — MAGNESIUM SULFATE 2 GM/50ML IV SOLN
2.0000 g | Freq: Once | INTRAVENOUS | Status: AC
  Administered 2023-09-13: 2 g via INTRAVENOUS
  Filled 2023-09-13: qty 50

## 2023-09-13 MED ORDER — PROCHLORPERAZINE EDISYLATE 10 MG/2ML IJ SOLN
10.0000 mg | Freq: Four times a day (QID) | INTRAMUSCULAR | Status: DC | PRN
  Administered 2023-09-13: 10 mg via INTRAVENOUS
  Filled 2023-09-13: qty 2

## 2023-09-13 MED ORDER — SODIUM CHLORIDE 0.9 % IV SOLN
3.0000 g | Freq: Three times a day (TID) | INTRAVENOUS | Status: DC
  Administered 2023-09-13 – 2023-09-15 (×6): 3 g via INTRAVENOUS
  Filled 2023-09-13 (×6): qty 8

## 2023-09-13 MED ORDER — IOHEXOL 9 MG/ML PO SOLN
500.0000 mL | ORAL | Status: AC
  Administered 2023-09-13 (×2): 500 mL via ORAL

## 2023-09-13 MED ORDER — IOHEXOL 350 MG/ML SOLN
75.0000 mL | Freq: Once | INTRAVENOUS | Status: AC | PRN
  Administered 2023-09-13: 75 mL via INTRAVENOUS

## 2023-09-13 NOTE — Plan of Care (Signed)

## 2023-09-13 NOTE — Plan of Care (Signed)
   Problem: Education: Goal: Knowledge of General Education information will improve Description Including pain rating scale, medication(s)/side effects and non-pharmacologic comfort measures Outcome: Progressing

## 2023-09-13 NOTE — Progress Notes (Addendum)
 Occupational Therapy Treatment Patient Details Name: Michelle Johnston MRN: [id number] DOB: 04/20/1950 Today's Date: 09/13/2023   History of present illness Pt is a 73 y/o female presenting with dizziness and dyspnea. Found to have hypokalemia, hyponatremia and elevated anion gap metabolic acidosis. CT head negative, negative for PE. THC+. 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 Spouse at bedside and engaged throughout session, confirmed his ability to provide assist with ADLs as needed. Handouts provided to maximize carryover. Pt politely deferred OOB ADL practice due to ongoing nausea today.       If plan is discharge home, recommend the following:  A little help with bathing/dressing/bathroom;Assistance with cooking/housework;Assist for transportation;Help with stairs or ramp for entrance   Equipment Recommendations  None recommended by OT    Recommendations for Other Services      Precautions / Restrictions Precautions Precautions: Fall;Shoulder Shoulder Interventions: Shoulder sling/immobilizer Precaution Booklet Issued: Yes (comment) Precaution/Restrictions Comments: no A/PROM of shoulder, ok for hand/wrist/elbow AROM Required Braces or Orthoses: Sling Restrictions Weight Bearing Restrictions Per Provider Order: Yes LUE Weight Bearing Per Provider Order: Non weight bearing Other Position/Activity Restrictions: NWB per op note 7/3       Mobility Bed Mobility               General bed mobility comments: pt deferred due to nausea    Transfers                   General transfer comment: pt deferred due to nausea     Balance                                           ADL either performed or assessed with clinical judgement   ADL Overall ADL's : Needs  assistance/impaired                                       General ADL Comments: Session focused on full shoulder protocol education after ortho PA clarification 7/13. Spouse present and engaged, reading handouts with OT and pt. Educated re: sling wear recommendations, allowed movements, positioning in bed, ADL strategies and fall prevention strategies    Extremity/Trunk Assessment Upper Extremity Assessment Upper Extremity Assessment: Right hand dominant;LUE deficits/detail LUE Deficits / Details: reverse TSA 7/3. bruising throughout UE. hand/wrist/elbow WFL   Lower Extremity Assessment Lower Extremity Assessment: Defer to PT evaluation        Vision   Vision Assessment?: No apparent visual deficits   Perception     Praxis     Communication Communication Communication: No apparent difficulties   Cognition Arousal: Alert Behavior During Therapy: WFL for tasks assessed/performed Cognition: No apparent impairments                               Following commands: Intact        Cueing   Cueing Techniques: Verbal cues  Exercises      Shoulder Instructions       General Comments Spouse present and engaged    Pertinent Vitals/ Pain       Pain Assessment Pain  Assessment: Faces Faces Pain Scale: Hurts a little bit Pain Location: L shoulder Pain Descriptors / Indicators: Grimacing, Guarding Pain Intervention(s): Monitored during session  Home Living                                          Prior Functioning/Environment              Frequency  Min 2X/week        Progress Toward Goals  OT Goals(current goals can now be found in the care plan section)  Progress towards OT goals: OT to reassess next treatment  Acute Rehab OT Goals Patient Stated Goal: get rid of this nausea, not feel so hot OT Goal Formulation: With patient Time For Goal Achievement: 09/26/23 Potential to Achieve Goals: Good ADL  Goals Pt Will Perform Upper Body Dressing: with modified independence;sitting Pt Will Perform Lower Body Dressing: with modified independence;sitting/lateral leans;sit to/from stand Additional ADL Goal #1: Pt to don L sling independently  Plan      Co-evaluation                 AM-PAC OT 6 Clicks Daily Activity     Outcome Measure   Help from another person eating meals?: None Help from another person taking care of personal grooming?: A Little Help from another person toileting, which includes using toliet, bedpan, or urinal?: A Little Help from another person bathing (including washing, rinsing, drying)?: A Little Help from another person to put on and taking off regular upper body clothing?: A Lot Help from another person to put on and taking off regular lower body clothing?: A Little 6 Click Score: 18    End of Session    OT Visit Diagnosis: Muscle weakness (generalized) (M62.81);Other abnormalities of gait and mobility (R26.89);History of falling (Z91.81)   Activity Tolerance Other (comment) (limited by nausea)   Patient Left in bed;with call bell/phone within reach;with nursing/sitter in room;with family/visitor present   Nurse Communication Mobility status        Time: 8788-8766 OT Time Calculation (min): 22 min  Charges: OT General Charges $OT Visit: 1 Visit OT Treatments $Self Care/Home Management : 8-22 mins  Mliss NOVAK, OTR/L Acute Rehab Services Office: [phone]   Mliss Fish 09/13/2023, 12:48 PM

## 2023-09-13 NOTE — Progress Notes (Signed)
 Transition of Care [facility]) - Inpatient Brief Assessment   Patient Details  Name: Michelle Johnston MRN: [id number] Date of Birth: 1950-11-28  [facility] CM/SW Contact:    Rosaline JONELLE Joe, RN Phone Number: 09/13/2023, 11:15 AM   Clinical Narrative: CM met with the patient/spouse at the bedside to discuss TOC needs.  The patient was offered Medicare choice regarding home health agency and patient did not have a preference.  [facility] was called and accepted home health services for PT.  HH orders placed to be co-signed by MD.  No other DME needed.  Patient with recent Left shoulder surgery by Dr. Cristy - arm remains in Left arm sling at this time with Ice pack in placement.  Patient admits to drinking 2 beers and 2 glasses of wine per day.  Patient declines need for OP counseling and states that she is never intoxicated and usually falls in the am unrelated to alcohol use.  Patient declined use of drugs nor smoking.  Patient  was transitioned to inpatient status per MD since blood cultures are pending - MD states ID to likely be consulted.  CM will continue to follow the patient for TOC needs.   Transition of Care Asessment: Insurance and Status: (P) Insurance coverage has been reviewed Patient has primary care physician: (P) Yes Home environment has been reviewed: (P) from home with spouse Prior level of function:: (P) RW, Hotel manager Home Services: (P) No current home services Social Drivers of Health Review: (P) SDOH reviewed needs interventions Readmission risk has been reviewed: (P) Yes Transition of care needs: (P) transition of care needs identified, TOC will continue to follow

## 2023-09-13 NOTE — Progress Notes (Signed)
   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 She feels like using a walker would help with her stability.   OBJECTIVE: PE: General: resting in hospital bed, NAD LUE: Dressing CDI - small amount of strike through noted on aquacel, and sling well fitting. + Motor in  AIN, PIN, Ulnar distributions. Axillary nerve sensation preserved and symmetric.  Sensation intact in medial, radial, and ulnar distributions. Well perfused digits.    Vitals:   09/13/23 0912 09/13/23 1243  BP: 92/64 (!) 176/65  Pulse: 73 71  Resp: 16 18  Temp: (!) 97.3 F (36.3 C) 98 F (36.7 C)  SpO2: 98% 99%   Stable post-op images.   ASSESSMENT: Michelle Johnston is a 73 y.o. female POD#9  PLAN: Weightbearing:  NWB LUE - okay to use left arm with walker when weightbearing - no ROM left shoulder - okay for passive/active ROM of left elbow, wrist, hand Insicional and dressing care: Reinforce dressings as needed Orthopedic device(s): Sling Showering: with assistance VTE prophylaxis: per primary team Pain control: PRN pain medications Follow - up plan: 1 week Contact information:  Weekdays 8-5 Dr. Bonner Hair, Aleck Stalling PA-C, After hours and holidays please check [email or web address] for group call information for [facility]  Aleck Stalling, PA-C 09/13/23

## 2023-09-13 NOTE — Progress Notes (Signed)
 PROGRESS NOTE    Michelle Johnston  FMW:[id number] DOB: 10/13/1950 DOA: 09/11/2023 PCP: Loreli Kins, MD   Brief Narrative:  Michelle Johnston is a 73 y.o. female with medical history significant of significant for but not limited to history of atrial fibrillation status post ablation and Watchman, depression, anxiety, GERD, essential hypertension, sleep apnea noncompliant with CPAP, history of bradycardia, glaucoma and other comorbidities who presented to the hospital with dizziness and dyspnea.  Patient states that she was dizzy a few days ago and presented to the ED yesterday and given IV fluids and discharged home.  Subsequently she woke up this morning and was persistently dizzy and felt as if she is going to pass out but did not actually pass out.  Had a fall a few weeks ago and had to have shoulder surgery last week.  Since her surgery her appetite has not been the best and symptoms have started worsening.  This morning when she woke up she felt dizzy but also had associated dyspnea.  States that since her shoulder surgery last week her appetite has not been the best but she has been eating.  States that she may have had 1 episode of vomiting but no real nausea.  States that she also had some diarrhea for about a day.  Denies any chest discomfort but did state that she feels dizzy still little bit as and had some dyspnea.  She denied any burning or discomfort in her urine or any other symptoms.  TRH was asked to admit this patient for further evaluation of her dizziness and presyncopal episode along with her associated dyspnea.   **Interim Hx: Syncope workup has been completed and she will be discharged home however prior to discharge her blood cultures turned positive for a gram-negative bacteremia.  She has been placed on oral Augmentin  and will need to await cultures and repeat blood cultures in a.m. Discussed with infectious diseases and they recommend escalating antibiotics and doing further workup with  CT scan of the abdomen pelvis as well as checking GI pathogen panel and C. difficile.  Assessment and Plan:  Dizziness and near syncope: Admit to the telemetry unit and placed in observation.  Give IV fluid hydration.  Check orthostatic vital signs.  Has had persistent dizziness the last few days and was discharged home after fluids yesterday but dizziness persisted and she was near syncopal.  Checked troponins and they were not elevated.  Evaluated for PE and this was ruled out.  Head CT done yesterday and was unremarkable we will get an MRI of the brain for further evaluation and showed Minimal nonspecific subcortical white matter disease. Otherwise, normal brain.Mild left maxillary sinus disease.. -Recent echo was done earlier this month.  THC was positive on UDS.  Blood cultures x 2 POSITVE for GNR BACTEREMIA.  Checked TSH and was 2.151.  UA not really indicative of a urinary tract infection.  PT OT to further evaluate as well as a vestibular evaluation and are recommending HH and Vestibular Evaluation done and they feel like she had a hypofunction of the Vestibular if no medical issue was found for dizziness.  - Was a little dizzy this morning but in the setting of nausea.  GNR Bacteremia: + Cx w/ unidentified Bacteria.  Repeating cultures currently pending started Augmentin  but after discussion with infectious diseases we will escalate to IV Unasyn .  Patient started having some diarrhea so we will check a GI pathogen panel as well as C. difficile.  She  was nauseous this a.m. given her symptoms we will also get a CT scan of the abdomen pelvis and showed No acute inflammatory process identified within the abdomen or pelvis.. Repeat Blood Cx x2 in the AM. D/w ID given unidentified Bacteria on Biofire.  Continue supportive care continue with IV fluid hydration and antiemetics.  Maxillary Sinus Disease: Flonase . Augmentin  discontinued and she has been started on IV Unasyn  as above   Dyspnea: Improved  . Had associated dyspnea the morning of Admission and was ruled out for PE even though that her D-dimer was elevated.  Continue monitor and give IV fluid hydration. Repeat chest x-ray in this AM showed no acute cardiopulmonary disease. Flutter Valve, Incentive Spirometry, and Guaifenesin  600 mg intiated along with Xopenex  [id number]  Recent Shoulder Surgery: Ortho was contacted and DG Shoulder done and showed Reverse shoulder arthroplasty without complication. She is to have NO ROM at L Shoulder and NWB w/ Left arm and in Sling all time. She is ok of AROM at L elbow base, wrist and fingers, and recommending not bearing weight on operative arm if she can walk with a cane in R hand.  Orthopedic surgery recommending reinforcing dressings as needed and following up with orthopedic surgery within 1 week.   Hypokalemia: Potassium had been low at 2.9 but improved to 4.1 yesterday and now is 3.9.  Was not able to tolerate IV potassium.  Checked magnesium  level and was 1.8 so we will replete with IV mag sulfate 2 g.   Hyponatremia: Patient's Na+ went from 131 -> 132 -> 131 -> 128.  Continue IV fluid hydration as above and changed to normal saline at 75 mL/h.  Continue monitor and and trend and repeat CMP in a.m.   Elevated anion gap metabolic acidosis: Question if this is in the setting of her poor p.o. intake.  States that she vomited once and has been having some diarrhea but only lasted for a day.  If she continues to have persistent diarrhea we will check her stool for infection.  Continue IV fluid hydration as above and MA is improving.  Now has resolved and improved and CO2 is 22, anion gap is 9, chloride level is 97  History of A-fib: Currently in sinus rhythm.  Continue with Carvedilol  6.25 mg po BID.  Continue monitor blood pressures per protocol   Normocytic/Macrocytic anemia: Hemoglobin/hematocrit went from 10.3/29.1 -> 10.4/29.7 -> 10.9/31.7 -> 10.8/31.3.  Checked Anemia Panel and showed an iron level of  44, UIBC of 299, TIBC of 343, saturation ratios of 13%, ferritin level 144, folate level 34.7 and a vitamin B12 level 3160.SABRA  Continue monitor for signs and symptoms bleeding; no overt bleeding noted.  Repeat CBC in a.m.   Hypoalbuminemia: Patient's Albumin Trend: Recent Labs  Lab 09/10/23 2002 09/11/23 1146 09/12/23 0816 09/13/23 0407  ALBUMIN 3.0* 3.2* 3.4* 3.2*  -Continue to Monitor and Trend and repeat CMP in the AM   GERD/GI prophylaxis: Continue with PPI with Pantoprazole  40 mg p.o. daily   DVT prophylaxis: enoxaparin  (LOVENOX ) injection 40 mg Start: 09/11/23 1830 SCDs Start: 09/11/23 1732    Code Status: Full Code Family Communication: Discussed with husband at bedside  Disposition Plan:  Level of care: Telemetry Medical Status is: Inpatient Remains inpatient appropriate because: Needs further clinical improvement and workup and clearance by the specialist.   Consultants:  Discussed the case with Infectious Diseases Orthopedic Surgery  Procedures:  As delineated as above  Antimicrobials:  Anti-infectives (From admission, onward)  Start     Dose/Rate Route Frequency Ordered Stop   09/13/23 1330  Ampicillin -Sulbactam (UNASYN ) 3 g in sodium chloride  0.9 % 100 mL IVPB        3 g 200 mL/hr over 30 Minutes Intravenous Every 8 hours 09/13/23 1236     09/12/23 1130  amoxicillin -clavulanate (AUGMENTIN ) 875-125 MG per tablet 1 tablet  Status:  Discontinued        1 tablet Oral Every 12 hours 09/12/23 1033 09/13/23 1159   09/12/23 0000  amoxicillin -clavulanate (AUGMENTIN ) 875-125 MG tablet        1 tablet Oral Every 12 hours 09/12/23 1037 09/17/23 2359       Subjective: Seen and examined at bedside and she was extremely nauseous this morning and appeared uncomfortable.  States that she had a terrible night and had some diarrhea overnight.  States that she feels that she is getting worse.  Was not dizzy.  Denies any other concerns or complaints at this  time.  Objective: Vitals:   09/13/23 0400 09/13/23 0912 09/13/23 1243 09/13/23 1945  BP: (!) 153/74 92/64 (!) 176/65 (!) 120/92  Pulse: 76 73 71 71  Resp: 18 16 18 17   Temp: 98.2 F (36.8 C) (!) 97.3 F (36.3 C) 98 F (36.7 C) 97.6 F (36.4 C)  TempSrc: Oral     SpO2: 98% 98% 99% 98%  Weight:      Height:        Intake/Output Summary (Last 24 hours) at 09/13/2023 2021 Last data filed at 09/13/2023 1636 Gross per 24 hour  Intake 475.88 ml  Output --  Net 475.88 ml   Filed Weights   09/11/23 2130  Weight: 67 kg   Examination: Physical Exam:  Constitutional: WN/WD, elderly Caucasian female who appears little anxious and a little uncomfortable Respiratory: Diminished to auscultation bilaterally, no wheezing, rales, rhonchi or crackles. Normal respiratory effort and patient is not tachypenic. No accessory muscle use.  Unlabored breathing Cardiovascular: RRR, no murmurs / rubs / gallops. S1 and S2 auscultated. No extremity edema.  Abdomen: Soft, non-tender, non-distended.  Bowel sounds positive.  GU: Deferred. Musculoskeletal: No clubbing / cyanosis of digits/nails. No joint deformity upper and lower extremities. Skin: No rashes, lesions, ulcers on limited skin evaluation. No induration; Warm and dry.  Neurologic: CN 2-12 grossly intact with no focal deficits. Sensation intact in all 4 Extremities, DTR normal. Strength 5/5 in all 4. Romberg sign cerebellar reflexes not assessed.  Psychiatric: Awake and alert and anxious  Data Reviewed: I have personally reviewed following labs and imaging studies  CBC: Recent Labs  Lab 09/10/23 2002 09/11/23 1121 09/12/23 0542 09/13/23 0407  WBC 6.1 6.5 6.8 6.5  NEUTROABS  --  5.3 4.8 4.4  HGB 10.3* 10.4* 10.9* 10.8*  HCT 29.1* 29.7* 31.7* 31.3*  MCV 98.3 99.7 101.6* 101.0*  PLT 354 373 366 411*   Basic Metabolic Panel: Recent Labs  Lab 09/10/23 2002 09/10/23 2035 09/11/23 1146 09/11/23 1626 09/12/23 0542 09/12/23 0816  09/13/23 0407  NA 131*  --  132*  --   --  131* 128*  K 2.9*  --  2.9*  --   --  4.1 3.9  CL 97*  --  98  --   --  103 97*  CO2 28  --  17*  --   --  19* 22  GLUCOSE 113*  --  116*  --   --  115* 109*  BUN 12  --  6*  --   --  <  5* 5*  CREATININE 0.66  --  0.46  --   --  0.32* 0.40*  CALCIUM  9.1  --  9.1  --   --  9.1 9.1  MG  --  1.7  --  1.8 2.1  --  1.8  PHOS  --   --   --  3.2 2.5  --  3.3   GFR: Estimated Creatinine Clearance: 54.7 mL/min (A) (by C-G formula based on SCr of 0.4 mg/dL (L)). Liver Function Tests: Recent Labs  Lab 09/10/23 2002 09/11/23 1146 09/12/23 0816 09/13/23 0407  AST 36 33 28 26  ALT 37 37 31 29  ALKPHOS 70 73 87 89  BILITOT 0.7 0.9 0.8 0.8  PROT 6.0* 6.3* 6.4* 6.2*  ALBUMIN 3.0* 3.2* 3.4* 3.2*   Recent Labs  Lab 09/10/23 2035  LIPASE 24   No results for input(s): AMMONIA in the last 168 hours. Coagulation Profile: Recent Labs  Lab 09/11/23 1121  INR 1.0   Cardiac Enzymes: No results for input(s): CKTOTAL, CKMB, CKMBINDEX, TROPONINI in the last 168 hours. BNP (last 3 results) No results for input(s): PROBNP in the last 8760 hours. HbA1C: Recent Labs    09/12/23 0542  HGBA1C 5.0   CBG: Recent Labs  Lab 09/10/23 1952  GLUCAP 110*   Lipid Profile: No results for input(s): CHOL, HDL, LDLCALC, TRIG, CHOLHDL, LDLDIRECT in the last 72 hours. Thyroid Function Tests: Recent Labs    09/12/23 0542  TSH 2.151   Anemia Panel: Recent Labs    09/13/23 0407  VITAMINB12 3,160*  FOLATE 34.7  FERRITIN 144  TIBC 343  IRON 44  RETICCTPCT 3.6*   Sepsis Labs: Recent Labs  Lab 09/11/23 1236  LATICACIDVEN 1.6   Recent Results (from the past 240 hours)  Blood Culture (routine x 2)     Status: None (Preliminary result)   Collection Time: 09/11/23 11:21 AM   Specimen: BLOOD  Result Value Ref Range Status   Specimen Description BLOOD SITE NOT SPECIFIED  Final   Special Requests   Final    BOTTLES DRAWN  AEROBIC AND ANAEROBIC Blood Culture adequate volume   Culture   Final    NO GROWTH 2 DAYS Performed at [facility], [address], [place], [place] [address]    Report Status PENDING  Incomplete  Resp panel by RT-PCR (RSV, Flu A&B, Covid) Anterior Nasal Swab     Status: None   Collection Time: 09/11/23 11:22 AM   Specimen: Anterior Nasal Swab  Result Value Ref Range Status   SARS Coronavirus 2 by RT PCR NEGATIVE NEGATIVE Final   Influenza A by PCR NEGATIVE NEGATIVE Final   Influenza B by PCR NEGATIVE NEGATIVE Final    Comment: (NOTE) The Xpert Xpress SARS-CoV-2/FLU/RSV plus assay is intended as an aid in the diagnosis of influenza from Nasopharyngeal swab specimens and should not be used as a sole basis for treatment. Nasal washings and aspirates are unacceptable for Xpert Xpress SARS-CoV-2/FLU/RSV testing.  Fact Sheet for Patients: BloggerCourse.com  Fact Sheet for Healthcare Providers: SeriousBroker.it  This test is not yet approved or cleared by the [place]  FDA and has been authorized for detection and/or diagnosis of SARS-CoV-2 by FDA under an Emergency Use Authorization (EUA). This EUA will remain in effect (meaning this test can be used) for the duration of the COVID-19 declaration under Section 564(b)(1) of the Act, 21 U.S.C. section 360bbb-3(b)(1), unless the authorization is terminated or revoked.     Resp Syncytial  Virus by PCR NEGATIVE NEGATIVE Final    Comment: (NOTE) Fact Sheet for Patients: BloggerCourse.com  Fact Sheet for Healthcare Providers: SeriousBroker.it  This test is not yet approved or cleared by the [place]  FDA and has been authorized for detection and/or diagnosis of SARS-CoV-2 by FDA under an Emergency Use Authorization (EUA). This EUA will remain in effect (meaning this test can be used) for the duration of the COVID-19  declaration under Section 564(b)(1) of the Act, 21 U.S.C. section 360bbb-3(b)(1), unless the authorization is terminated or revoked.  Performed at [facility], [address] [place], [place] [address]   Blood Culture (routine x 2)     Status: None (Preliminary result)   Collection Time: 09/11/23  1:36 PM   Specimen: BLOOD RIGHT ARM  Result Value Ref Range Status   Specimen Description BLOOD RIGHT ARM  Final   Special Requests   Final    BOTTLES DRAWN AEROBIC AND ANAEROBIC Blood Culture adequate volume   Culture  Setup Time   Final    GRAM NEGATIVE RODS ANAEROBIC BOTTLE ONLY PHARMD JESSICA M. [id number], ADC Performed at [facility], [address], [place], [place] [address]    Culture GRAM NEGATIVE RODS  Final   Report Status PENDING  Incomplete  Blood Culture ID Panel (Reflexed)     Status: None   Collection Time: 09/11/23  1:36 PM  Result Value Ref Range Status   Enterococcus faecalis NOT DETECTED NOT DETECTED Final   Enterococcus Faecium NOT DETECTED NOT DETECTED Final   Listeria monocytogenes NOT DETECTED NOT DETECTED Final   Staphylococcus species NOT DETECTED NOT DETECTED Final   Staphylococcus aureus (BCID) NOT DETECTED NOT DETECTED Final   Staphylococcus epidermidis NOT DETECTED NOT DETECTED Final   Staphylococcus lugdunensis NOT DETECTED NOT DETECTED Final   Streptococcus species NOT DETECTED NOT DETECTED Final   Streptococcus agalactiae NOT DETECTED NOT DETECTED Final   Streptococcus pneumoniae NOT DETECTED NOT DETECTED Final   Streptococcus pyogenes NOT DETECTED NOT DETECTED Final   A.calcoaceticus-baumannii NOT DETECTED NOT DETECTED Final   Bacteroides fragilis NOT DETECTED NOT DETECTED Final   Enterobacterales NOT DETECTED NOT DETECTED Final   Enterobacter cloacae complex NOT DETECTED NOT DETECTED Final   Escherichia coli NOT DETECTED NOT DETECTED Final   Klebsiella aerogenes NOT DETECTED NOT DETECTED Final   Klebsiella oxytoca NOT DETECTED NOT  DETECTED Final   Klebsiella pneumoniae NOT DETECTED NOT DETECTED Final   Proteus species NOT DETECTED NOT DETECTED Final   Salmonella species NOT DETECTED NOT DETECTED Final   Serratia marcescens NOT DETECTED NOT DETECTED Final   Haemophilus influenzae NOT DETECTED NOT DETECTED Final   Neisseria meningitidis NOT DETECTED NOT DETECTED Final   Pseudomonas aeruginosa NOT DETECTED NOT DETECTED Final   Stenotrophomonas maltophilia NOT DETECTED NOT DETECTED Final   Candida albicans NOT DETECTED NOT DETECTED Final   Candida auris NOT DETECTED NOT DETECTED Final   Candida glabrata NOT DETECTED NOT DETECTED Final   Candida krusei NOT DETECTED NOT DETECTED Final   Candida parapsilosis NOT DETECTED NOT DETECTED Final   Candida tropicalis NOT DETECTED NOT DETECTED Final   Cryptococcus neoformans/gattii NOT DETECTED NOT DETECTED Final    Comment: Performed at [facility], [address] [place], [place] [address]  Culture, blood (Routine X 2) w Reflex to ID Panel     Status: None (Preliminary result)   Collection Time: 09/13/23 12:01 AM   Specimen: BLOOD  Result Value Ref Range Status  Specimen Description BLOOD SITE NOT SPECIFIED  Final   Special Requests   Final    BOTTLES DRAWN AEROBIC ONLY Blood Culture results may not be optimal due to an inadequate volume of blood received in culture bottles   Culture   Final    NO GROWTH < 12 HOURS Performed at [facility], [address] [place], [place] [address]    Report Status PENDING  Incomplete    Radiology Studies: CT ABDOMEN PELVIS W CONTRAST Result Date: 09/13/2023 CLINICAL DATA:  Abdominal pain, acute, nonlocalized Nausea and Vomiting. EXAM: CT ABDOMEN AND PELVIS WITH CONTRAST TECHNIQUE: Multidetector CT imaging of the abdomen and pelvis was performed using the standard protocol following bolus administration of intravenous contrast. RADIATION DOSE REDUCTION: This exam was performed according to the departmental dose-optimization  program which includes automated exposure control, adjustment of the mA and/or kV according to patient size and/or use of iterative reconstruction technique. CONTRAST:  75mL OMNIPAQUE  IOHEXOL  350 MG/ML SOLN COMPARISON:  CT scan abdomen and pelvis from 04/21/2023. FINDINGS: Lower chest: There are dependent changes in the visualized lung bases. No overt consolidation. No pleural effusion. The heart is normal in size. No pericardial effusion. Hepatobiliary: The liver is normal in size. Non-cirrhotic configuration. No suspicious mass. A 6 mm calcified granuloma noted in the right hepatic lobe. No intrahepatic or extrahepatic bile duct dilation. No calcified gallstones. Normal gallbladder wall thickness. No pericholecystic inflammatory changes. Pancreas: Unremarkable. No pancreatic ductal dilatation or surrounding inflammatory changes. Spleen: Within normal limits. No focal lesion. Adrenals/Urinary Tract: Adrenal glands are unremarkable. No suspicious renal mass. No hydronephrosis. Bilateral extrarenal pelves noted. No renal or ureteric calculi. Mild bladder wall trabeculations noted, nonspecific with commonly seen as a sequela of urinary outflow obstruction. No focal mass or bladder calculi seen. Stomach/Bowel: No disproportionate dilation of the small or large bowel loops. No evidence of abnormal bowel wall thickening or inflammatory changes. The appendix is unremarkable. There are scattered diverticula mainly in the left hemi colon, without imaging signs of diverticulitis. Vascular/Lymphatic: No ascites or pneumoperitoneum. No abdominal or pelvic lymphadenopathy, by size criteria. No aneurysmal dilation of the major abdominal arteries. There are moderate peripheral atherosclerotic vascular calcifications of the aorta and its major branches. Reproductive: The uterus is surgically absent. No large adnexal mass. Other: There is a tiny fat containing umbilical hernia. The soft tissues and abdominal wall are otherwise  unremarkable. Musculoskeletal: No suspicious osseous lesions. There are moderate multilevel degenerative changes in the visualized spine. IMPRESSION: 1. No acute inflammatory process identified within the abdomen or pelvis. 2. Multiple other nonacute observations, as described above. Aortic Atherosclerosis (ICD10-I70.0). Electronically Signed   By: Ree Molt M.D.   On: 09/13/2023 16:50   DG Shoulder Left Port Result Date: 09/12/2023 CLINICAL DATA:  Status post reverse shoulder arthroplasty. EXAM: LEFT SHOULDER COMPARISON:  Radiograph 09/02/2023, CT 08/31/2023 FINDINGS: Reverse shoulder arthroplasty in expected alignment. Fracture fragment adjacent to the humeral known into is grossly unchanged from prior. No periprosthetic lucency. Stable acromioclavicular alignment. Resolving soft tissue edema with resolved soft tissue gas. IMPRESSION: Reverse shoulder arthroplasty without complication. Electronically Signed   By: Andrea Gasman M.D.   On: 09/12/2023 14:21   X-ray chest PA and lateral Result Date: 09/12/2023 CLINICAL DATA:  Dyspnea. EXAM: CHEST - 2 VIEW COMPARISON:  09/11/2023 FINDINGS: The lungs are clear without focal pneumonia, edema, pneumothorax or pleural effusion. The cardiopericardial silhouette is within normal limits for size. No acute bony abnormality. Status post left shoulder replacement. Telemetry leads overlie  the chest. IMPRESSION: No active cardiopulmonary disease. Electronically Signed   By: Camellia Candle M.D.   On: 09/12/2023 08:29   Scheduled Meds:  amLODipine   10 mg Oral QHS   ascorbic acid   500 mg Oral Daily   aspirin  EC  81 mg Oral Daily   B-complex with vitamin C   1 tablet Oral Daily   calcium  carbonate  1 tablet Oral Q breakfast   carvedilol   6.25 mg Oral BID WC   cholecalciferol   2,000 Units Oral Daily   cyanocobalamin   1,000 mcg Intramuscular Q Sun   enoxaparin  (LOVENOX ) injection  40 mg Subcutaneous Q24H   feeding supplement  237 mL Oral BID BM   FLUoxetine   40  mg Oral Daily   fluticasone   1 spray Each Nare Daily   gabapentin   300 mg Oral Daily   And   gabapentin   600 mg Oral QHS   guaiFENesin   600 mg Oral BID   latanoprost   1 drop Both Eyes [id number]   loratadine   10 mg Oral Daily   methylphenidate   5 mg Oral Daily   multivitamin with minerals  1 tablet Oral Daily   omega-3 acid ethyl esters  1 g Oral Daily   pantoprazole   40 mg Oral Daily   rosuvastatin   20 mg Oral Daily   sodium bicarbonate   650 mg Oral BID   Continuous Infusions:  sodium chloride  75 mL/hr at 09/13/23 1635   ampicillin -sulbactam (UNASYN ) IV Stopped (09/13/23 1356)    LOS: 0 days   Alejandro Marker, DO [facility] Available via Epic secure chat 7am-7pm After these hours, please refer to coverage provider listed on [email or web address] 09/13/2023, 8:21 PM

## 2023-09-13 NOTE — Progress Notes (Signed)
 Patient states she wants to miss one dose of antibiotics because she had 2 episodes of loose stool and thinks that she will feel better if she misses one dose. Patient educated on importance of taking prescribed medications on time. She state it will cause her more nausea as well.

## 2023-09-14 DIAGNOSIS — R110 Nausea: Secondary | ICD-10-CM | POA: Diagnosis not present

## 2023-09-14 DIAGNOSIS — R55 Syncope and collapse: Secondary | ICD-10-CM | POA: Diagnosis not present

## 2023-09-14 DIAGNOSIS — R7881 Bacteremia: Secondary | ICD-10-CM | POA: Diagnosis not present

## 2023-09-14 LAB — COMPREHENSIVE METABOLIC PANEL WITH GFR
ALT: 26 U/L (ref 0–44)
AST: 23 U/L (ref 15–41)
Albumin: 3 g/dL — ABNORMAL LOW (ref 3.5–5.0)
Alkaline Phosphatase: 92 U/L (ref 38–126)
Anion gap: 10 (ref 5–15)
BUN: <5 mg/dL — ABNORMAL LOW (ref 8–23)
CO2: 23 mmol/L (ref 22–32)
Calcium: 8.8 mg/dL — ABNORMAL LOW (ref 8.9–10.3)
Chloride: 98 mmol/L (ref 98–111)
Creatinine, Ser: 0.41 mg/dL — ABNORMAL LOW (ref 0.44–1.00)
GFR, Estimated: >60 mL/min (ref >60)
Glucose, Bld: 101 mg/dL — ABNORMAL HIGH (ref 70–99)
Potassium: 3.8 mmol/L (ref 3.5–5.1)
Sodium: 131 mmol/L — ABNORMAL LOW (ref 135–145)
Total Bilirubin: 0.7 mg/dL (ref 0.0–1.2)
Total Protein: 5.9 g/dL — ABNORMAL LOW (ref 6.5–8.1)

## 2023-09-14 LAB — CBC WITH DIFFERENTIAL/PLATELET
Abs Immature Granulocytes: 0.05 K/uL (ref 0.00–0.07)
Basophils Absolute: 0 K/uL (ref 0.0–0.1)
Basophils Relative: 1 %
Eosinophils Absolute: 0.2 K/uL (ref 0.0–0.5)
Eosinophils Relative: 4 %
HCT: 31.2 % — ABNORMAL LOW (ref 36.0–46.0)
Hemoglobin: 10.5 g/dL — ABNORMAL LOW (ref 12.0–15.0)
Immature Granulocytes: 1 %
Lymphocytes Relative: 16 %
Lymphs Abs: 1 K/uL (ref 0.7–4.0)
MCH: 34.2 pg — ABNORMAL HIGH (ref 26.0–34.0)
MCHC: 33.7 g/dL (ref 30.0–36.0)
MCV: 101.6 fL — ABNORMAL HIGH (ref 80.0–100.0)
Monocytes Absolute: 0.5 K/uL (ref 0.1–1.0)
Monocytes Relative: 8 %
Neutro Abs: 4.3 K/uL (ref 1.7–7.7)
Neutrophils Relative %: 70 %
Platelets: 423 K/uL — ABNORMAL HIGH (ref 150–400)
RBC: 3.07 MIL/uL — ABNORMAL LOW (ref 3.87–5.11)
RDW: 12.7 % (ref 11.5–15.5)
WBC: 6.1 K/uL (ref 4.0–10.5)
nRBC: 0 % (ref 0.0–0.2)

## 2023-09-14 LAB — PHOSPHORUS: Phosphorus: 3.8 mg/dL (ref 2.5–4.6)

## 2023-09-14 LAB — MAGNESIUM: Magnesium: 2 mg/dL (ref 1.7–2.4)

## 2023-09-14 NOTE — Progress Notes (Signed)
 PROGRESS NOTE    SHELISHA GAUTIER  FMW:[id number] DOB: 12-03-1950 DOA: 09/11/2023 PCP: Loreli Kins, MD   Brief Narrative:  Michelle Johnston is a 73 y.o. female with medical history significant of significant for but not limited to history of atrial fibrillation status post ablation and Watchman, depression, anxiety, GERD, essential hypertension, sleep apnea noncompliant with CPAP, history of bradycardia, glaucoma and other comorbidities who presented to the hospital with dizziness and dyspnea.    **Interim Hx: Syncope workup has been completed and she was to be discharged home however prior to discharge her blood cultures turned positive for a gram-negative bacteremia.  She has been placed on oral Augmentin  but after further discussion with ID and they recommend escalating antibiotics and doing further workup with CT scan of the abdomen pelvis as well as checking GI pathogen panel and C. difficile.  He has not had any more bowel movements since but we will need to be to monitor.  CT was not really revealing.  Cultures have been reincubated and after discussion again with infectious disease team they are recommending not discharging the patient just yet until cultures result to her direct antibiotic specific management.  Assessment and Plan:  Dizziness and near syncope: Admit to the telemetry unit and placed in observation given her bacteremia and was changed to inpatient.  She is status post give IV fluid hydration.  Check orthostatic vital signs.  Has had persistent dizziness the last few days prior to admission and actually came to the ED prior to this admission and discharged home after fluids but dizziness persisted and she was near syncopal.  Checked troponins and they were not elevated.  Evaluated for PE and this was ruled out.  Head CT done yesterday and was unremarkable; MRI of the brain for further evaluation and showed only nonspecific subcortical white matter disease that was minimal but an overall  otherwise normal brain and she was also noted to have mild maxillary sinus disease.   -Recent echo was done earlier this month so will not repeat.  THC was positive on UDS.  Blood cultures x 2 POSITVE for GNR BACTEREMIA.  Checked TSH and was 2.151.  UA not really indicative of a urinary tract infection.  PT/OT to further evaluate as well as a vestibular evaluation and are recommending H/H. On Vestibular Evaluation the therapist feels like the patient had a hypofunction of the Vestibular if no medical issue was found for dizziness.  -Dizziness seems to have improved and resolved.  GNR Bacteremia: + Cx w/ unidentified Bacteria so far so Cx reincubated for better growth.  Repeating cultures (1 only repeated for some reason) showing NGTD @ 1 day; Initially Started Augmentin  but after discussion with infectious diseases we will escalate to IV Unasyn .  Patient started having some diarrhea so checking GI pathogen panel as well as C. Difficile but since then has not had any more diarrheal episodes. She was nauseous severely nauseous yesterday a.m. so we obtained a CT scan of the abdomen pelvis and showed No acute inflammatory process identified within the abdomen or pelvis.. Repeat Blood Cx x2 in the AM and Per further discussion with ID Dr. Dennise, she recommended not discharging the patient home just yet until cultures are identified to help navigate antibiotic management and direction.  Maxillary Sinus Disease: Flonase . Augmentin  discontinued and she has been started on IV Unasyn  as above   Dyspnea: Improved . Had associated dyspnea the morning of Admission and was ruled out for PE even though  that her D-dimer was elevated.  Continue monitor and give IV fluid hydration. Repeat chest x-ray showed no acute cardiopulmonary disease. Flutter Valve, Incentive Spirometry, and Guaifenesin  600 mg intiated along with Xopenex  [id number]; Will need Ambulatory O2 Screen prior to D/C  Recent Shoulder Surgery: Ortho was contacted  and DG Shoulder done and showed Reverse shoulder arthroplasty without complication. She is to have NO ROM at L Shoulder and NWB w/ Left arm and in Sling all time. She is ok of AROM at L elbow base, wrist and fingers, and recommending not bearing weight on operative arm if she can walk with a cane in R hand.  Orthopedic surgery recommending reinforcing dressings as needed and following up with orthopedic surgery within 1 week.   Hypokalemia:  Improved. K+ now stable and last check was 3.8. CTM and replete as Necessary. Repeat CMP in the AM    Hyponatremia: Patient's Na+ went from 131 -> 132 -> 131 -> 128 -> 131.  Continue IV fluid hydration as above and changed to normal saline at 75 mL/h. CTM and trend; Repeat CMP in a.m.   Elevated anion gap metabolic acidosis: Question if this is in the setting of her poor p.o. intake.  States that she vomited once and has been having some diarrhea but only lasted for a day.  If she continues to have persistent diarrhea we will check her stool for infection.  Continue IV fluid hydration as above and MA is improving.  Now has resolved and improved and CO2 is 22, anion gap is 9, chloride level is 97  History of A-fib: Currently in sinus rhythm.  Continue with Carvedilol  6.25 mg po BID.  Continue monitor blood pressures per protocol   Normocytic/Macrocytic anemia: Hemoglobin/hematocrit relatively stable and last check was 10.5/31.2.  Checked Anemia Panel and showed an iron level of 44, UIBC of 299, TIBC of 343, saturation ratios of 13%, ferritin level 144, folate level 34.7 and a vitamin B12 level 3160.SABRA  Continue monitor for signs and symptoms bleeding; no overt bleeding noted.  Repeat CBC in a.m.   Hypoalbuminemia: Patient's Albumin Lvls ranging from 3.0-3.4. CTM and Trend and repeat CMP in the AM  GERD/GI prophylaxis: Continue with PPI with Pantoprazole  40 mg p.o. daily   DVT prophylaxis: enoxaparin  (LOVENOX ) injection 40 mg Start: 09/11/23 1830 SCDs Start:  09/11/23 1732    Code Status: Full Code Family Communication: D/w friend @ bedside   Disposition Plan:  Level of care: Telemetry Medical Status is: Inpatient Remains inpatient appropriate because: Dizziness improved but ID recommending observing patient until Organism is identified   Consultants:  D/w Dr. Dennise of ID  Procedures:  As delineated as above  CT Abd/Pelvis  Antimicrobials:  Anti-infectives (From admission, onward)    Start     Dose/Rate Route Frequency Ordered Stop   09/13/23 1330  Ampicillin -Sulbactam (UNASYN ) 3 g in sodium chloride  0.9 % 100 mL IVPB        3 g 200 mL/hr over 30 Minutes Intravenous Every 8 hours 09/13/23 1236     09/12/23 1130  amoxicillin -clavulanate (AUGMENTIN ) 875-125 MG per tablet 1 tablet  Status:  Discontinued        1 tablet Oral Every 12 hours 09/12/23 1033 09/13/23 1159   09/12/23 0000  amoxicillin -clavulanate (AUGMENTIN ) 875-125 MG tablet        1 tablet Oral Every 12 hours 09/12/23 1037 09/17/23 2359       Subjective: Seen and examined at bedside and states that she is doing  much better today compared to yesterday.  States her dizziness is resolved and she is not as nauseous.  Has not had any further bowel movements since her diarrheal episodes.  No other concerns or complaints at this time and hoping to go home today.  Objective: Vitals:   09/14/23 0400 09/14/23 0819 09/14/23 1309 09/14/23 1610  BP: (!) 164/86 98/84 (!) 149/54 (!) 157/72  Pulse: 74 64 68 71  Resp: 18 18 20    Temp: 98.2 F (36.8 C) 97.6 F (36.4 C)    TempSrc: Oral Oral    SpO2: 98% 98% 98% 100%  Weight:      Height:        Intake/Output Summary (Last 24 hours) at 09/14/2023 1939 Last data filed at 09/14/2023 9547 Gross per 24 hour  Intake 1048.88 ml  Output --  Net 1048.88 ml   Filed Weights   09/11/23 2130  Weight: 67 kg   Examination: Physical Exam:  Constitutional: Elderly Caucasian female who appears calm and comfortable today. Respiratory:  Diminished to auscultation bilaterally, no wheezing, rales, rhonchi or crackles. Normal respiratory effort and patient is not tachypenic. No accessory muscle use.  Unlabored breathing Cardiovascular: RRR, no murmurs / rubs / gallops. S1 and S2 auscultated. No extremity edema.  Abdomen: Soft, non-tender, non-distended.  Bowel sounds positive.  GU: Deferred. Musculoskeletal: No clubbing / cyanosis of digits/nails. No joint deformity upper and lower extremities. Skin: No rashes, lesions, ulcers on limited skin evaluation. No induration; Warm and dry.  Neurologic: CN 2-12 grossly intact with no focal deficits. Romberg sign and cerebellar reflexes not assessed.  Psychiatric: Normal judgment and insight.  Awake and alert  Data Reviewed: I have personally reviewed following labs and imaging studies  CBC: Recent Labs  Lab 09/10/23 2002 09/11/23 1121 09/12/23 0542 09/13/23 0407 09/14/23 0441  WBC 6.1 6.5 6.8 6.5 6.1  NEUTROABS  --  5.3 4.8 4.4 4.3  HGB 10.3* 10.4* 10.9* 10.8* 10.5*  HCT 29.1* 29.7* 31.7* 31.3* 31.2*  MCV 98.3 99.7 101.6* 101.0* 101.6*  PLT 354 373 366 411* 423*   Basic Metabolic Panel: Recent Labs  Lab 09/10/23 2002 09/10/23 2035 09/11/23 1146 09/11/23 1626 09/12/23 0542 09/12/23 0816 09/13/23 0407 09/14/23 0441  NA 131*  --  132*  --   --  131* 128* 131*  K 2.9*  --  2.9*  --   --  4.1 3.9 3.8  CL 97*  --  98  --   --  103 97* 98  CO2 28  --  17*  --   --  19* 22 23  GLUCOSE 113*  --  116*  --   --  115* 109* 101*  BUN 12  --  6*  --   --  <5* 5* <5*  CREATININE 0.66  --  0.46  --   --  0.32* 0.40* 0.41*  CALCIUM  9.1  --  9.1  --   --  9.1 9.1 8.8*  MG  --  1.7  --  1.8 2.1  --  1.8 2.0  PHOS  --   --   --  3.2 2.5  --  3.3 3.8   GFR: Estimated Creatinine Clearance: 54.7 mL/min (A) (by C-G formula based on SCr of 0.41 mg/dL (L)). Liver Function Tests: Recent Labs  Lab 09/10/23 2002 09/11/23 1146 09/12/23 0816 09/13/23 0407 09/14/23 0441  AST 36 33 28  26 23   ALT 37 37 31 29 26   ALKPHOS 70 73 87 89  92  BILITOT 0.7 0.9 0.8 0.8 0.7  PROT 6.0* 6.3* 6.4* 6.2* 5.9*  ALBUMIN 3.0* 3.2* 3.4* 3.2* 3.0*   Recent Labs  Lab 09/10/23 2035  LIPASE 24   No results for input(s): AMMONIA in the last 168 hours. Coagulation Profile: Recent Labs  Lab 09/11/23 1121  INR 1.0   Cardiac Enzymes: No results for input(s): CKTOTAL, CKMB, CKMBINDEX, TROPONINI in the last 168 hours. BNP (last 3 results) No results for input(s): PROBNP in the last 8760 hours. HbA1C: Recent Labs    09/12/23 0542  HGBA1C 5.0   CBG: Recent Labs  Lab 09/10/23 1952  GLUCAP 110*   Lipid Profile: No results for input(s): CHOL, HDL, LDLCALC, TRIG, CHOLHDL, LDLDIRECT in the last 72 hours. Thyroid Function Tests: Recent Labs    09/12/23 0542  TSH 2.151   Anemia Panel: Recent Labs    09/13/23 0407  VITAMINB12 3,160*  FOLATE 34.7  FERRITIN 144  TIBC 343  IRON 44  RETICCTPCT 3.6*   Sepsis Labs: Recent Labs  Lab 09/11/23 1236  LATICACIDVEN 1.6   Recent Results (from the past 240 hours)  Blood Culture (routine x 2)     Status: None (Preliminary result)   Collection Time: 09/11/23 11:21 AM   Specimen: BLOOD  Result Value Ref Range Status   Specimen Description BLOOD SITE NOT SPECIFIED  Final   Special Requests   Final    BOTTLES DRAWN AEROBIC AND ANAEROBIC Blood Culture adequate volume   Culture   Final    NO GROWTH 3 DAYS Performed at [facility], [address] [place], [place] [address]    Report Status PENDING  Incomplete  Resp panel by RT-PCR (RSV, Flu A&B, Covid) Anterior Nasal Swab     Status: None   Collection Time: 09/11/23 11:22 AM   Specimen: Anterior Nasal Swab  Result Value Ref Range Status   SARS Coronavirus 2 by RT PCR NEGATIVE NEGATIVE Final   Influenza A by PCR NEGATIVE NEGATIVE Final   Influenza B by PCR NEGATIVE NEGATIVE Final    Comment: (NOTE) The Xpert Xpress SARS-CoV-2/FLU/RSV plus assay is  intended as an aid in the diagnosis of influenza from Nasopharyngeal swab specimens and should not be used as a sole basis for treatment. Nasal washings and aspirates are unacceptable for Xpert Xpress SARS-CoV-2/FLU/RSV testing.  Fact Sheet for Patients: BloggerCourse.com  Fact Sheet for Healthcare Providers: SeriousBroker.it  This test is not yet approved or cleared by the [place]  FDA and has been authorized for detection and/or diagnosis of SARS-CoV-2 by FDA under an Emergency Use Authorization (EUA). This EUA will remain in effect (meaning this test can be used) for the duration of the COVID-19 declaration under Section 564(b)(1) of the Act, 21 U.S.C. section 360bbb-3(b)(1), unless the authorization is terminated or revoked.     Resp Syncytial Virus by PCR NEGATIVE NEGATIVE Final    Comment: (NOTE) Fact Sheet for Patients: BloggerCourse.com  Fact Sheet for Healthcare Providers: SeriousBroker.it  This test is not yet approved or cleared by the [place]  FDA and has been authorized for detection and/or diagnosis of SARS-CoV-2 by FDA under an Emergency Use Authorization (EUA). This EUA will remain in effect (meaning this test can be used) for the duration of the COVID-19 declaration under Section 564(b)(1) of the Act, 21 U.S.C. section 360bbb-3(b)(1), unless the authorization is terminated or revoked.  Performed at [facility], [address], [place], [place] [address]   Blood Culture (routine x 2)  Status: None (Preliminary result)   Collection Time: 09/11/23  1:36 PM   Specimen: BLOOD RIGHT ARM  Result Value Ref Range Status   Specimen Description BLOOD RIGHT ARM  Final   Special Requests   Final    BOTTLES DRAWN AEROBIC AND ANAEROBIC Blood Culture adequate volume   Culture  Setup Time   Final    GRAM NEGATIVE RODS ANAEROBIC BOTTLE ONLY PHARMD  JESSICA M. [id number], ADC    Culture   Final    GRAM NEGATIVE RODS CULTURE REINCUBATED FOR BETTER GROWTH Performed at [facility], [address], [place], [place] [address]    Report Status PENDING  Incomplete  Blood Culture ID Panel (Reflexed)     Status: None   Collection Time: 09/11/23  1:36 PM  Result Value Ref Range Status   Enterococcus faecalis NOT DETECTED NOT DETECTED Final   Enterococcus Faecium NOT DETECTED NOT DETECTED Final   Listeria monocytogenes NOT DETECTED NOT DETECTED Final   Staphylococcus species NOT DETECTED NOT DETECTED Final   Staphylococcus aureus (BCID) NOT DETECTED NOT DETECTED Final   Staphylococcus epidermidis NOT DETECTED NOT DETECTED Final   Staphylococcus lugdunensis NOT DETECTED NOT DETECTED Final   Streptococcus species NOT DETECTED NOT DETECTED Final   Streptococcus agalactiae NOT DETECTED NOT DETECTED Final   Streptococcus pneumoniae NOT DETECTED NOT DETECTED Final   Streptococcus pyogenes NOT DETECTED NOT DETECTED Final   A.calcoaceticus-baumannii NOT DETECTED NOT DETECTED Final   Bacteroides fragilis NOT DETECTED NOT DETECTED Final   Enterobacterales NOT DETECTED NOT DETECTED Final   Enterobacter cloacae complex NOT DETECTED NOT DETECTED Final   Escherichia coli NOT DETECTED NOT DETECTED Final   Klebsiella aerogenes NOT DETECTED NOT DETECTED Final   Klebsiella oxytoca NOT DETECTED NOT DETECTED Final   Klebsiella pneumoniae NOT DETECTED NOT DETECTED Final   Proteus species NOT DETECTED NOT DETECTED Final   Salmonella species NOT DETECTED NOT DETECTED Final   Serratia marcescens NOT DETECTED NOT DETECTED Final   Haemophilus influenzae NOT DETECTED NOT DETECTED Final   Neisseria meningitidis NOT DETECTED NOT DETECTED Final   Pseudomonas aeruginosa NOT DETECTED NOT DETECTED Final   Stenotrophomonas maltophilia NOT DETECTED NOT DETECTED Final   Candida albicans NOT DETECTED NOT DETECTED Final   Candida auris NOT DETECTED NOT DETECTED  Final   Candida glabrata NOT DETECTED NOT DETECTED Final   Candida krusei NOT DETECTED NOT DETECTED Final   Candida parapsilosis NOT DETECTED NOT DETECTED Final   Candida tropicalis NOT DETECTED NOT DETECTED Final   Cryptococcus neoformans/gattii NOT DETECTED NOT DETECTED Final    Comment: Performed at [facility], [address] [place], [place] [address]  Culture, blood (Routine X 2) w Reflex to ID Panel     Status: None (Preliminary result)   Collection Time: 09/13/23 12:01 AM   Specimen: BLOOD  Result Value Ref Range Status   Specimen Description BLOOD SITE NOT SPECIFIED  Final   Special Requests   Final    BOTTLES DRAWN AEROBIC ONLY Blood Culture results may not be optimal due to an inadequate volume of blood received in culture bottles   Culture   Final    NO GROWTH 1 DAY Performed at [facility], [address], [place], [place] [address]    Report Status PENDING  Incomplete    Radiology Studies: CT ABDOMEN PELVIS W CONTRAST Result Date: 09/13/2023 CLINICAL DATA:  Abdominal pain, acute, nonlocalized Nausea and Vomiting. EXAM: CT ABDOMEN AND PELVIS WITH CONTRAST TECHNIQUE:  Multidetector CT imaging of the abdomen and pelvis was performed using the standard protocol following bolus administration of intravenous contrast. RADIATION DOSE REDUCTION: This exam was performed according to the departmental dose-optimization program which includes automated exposure control, adjustment of the mA and/or kV according to patient size and/or use of iterative reconstruction technique. CONTRAST:  75mL OMNIPAQUE  IOHEXOL  350 MG/ML SOLN COMPARISON:  CT scan abdomen and pelvis from 04/21/2023. FINDINGS: Lower chest: There are dependent changes in the visualized lung bases. No overt consolidation. No pleural effusion. The heart is normal in size. No pericardial effusion. Hepatobiliary: The liver is normal in size. Non-cirrhotic configuration. No suspicious mass. A 6 mm calcified granuloma noted  in the right hepatic lobe. No intrahepatic or extrahepatic bile duct dilation. No calcified gallstones. Normal gallbladder wall thickness. No pericholecystic inflammatory changes. Pancreas: Unremarkable. No pancreatic ductal dilatation or surrounding inflammatory changes. Spleen: Within normal limits. No focal lesion. Adrenals/Urinary Tract: Adrenal glands are unremarkable. No suspicious renal mass. No hydronephrosis. Bilateral extrarenal pelves noted. No renal or ureteric calculi. Mild bladder wall trabeculations noted, nonspecific with commonly seen as a sequela of urinary outflow obstruction. No focal mass or bladder calculi seen. Stomach/Bowel: No disproportionate dilation of the small or large bowel loops. No evidence of abnormal bowel wall thickening or inflammatory changes. The appendix is unremarkable. There are scattered diverticula mainly in the left hemi colon, without imaging signs of diverticulitis. Vascular/Lymphatic: No ascites or pneumoperitoneum. No abdominal or pelvic lymphadenopathy, by size criteria. No aneurysmal dilation of the major abdominal arteries. There are moderate peripheral atherosclerotic vascular calcifications of the aorta and its major branches. Reproductive: The uterus is surgically absent. No large adnexal mass. Other: There is a tiny fat containing umbilical hernia. The soft tissues and abdominal wall are otherwise unremarkable. Musculoskeletal: No suspicious osseous lesions. There are moderate multilevel degenerative changes in the visualized spine. IMPRESSION: 1. No acute inflammatory process identified within the abdomen or pelvis. 2. Multiple other nonacute observations, as described above. Aortic Atherosclerosis (ICD10-I70.0). Electronically Signed   By: Ree Molt M.D.   On: 09/13/2023 16:50   Scheduled Meds:  amLODipine   10 mg Oral QHS   ascorbic acid   500 mg Oral Daily   aspirin  EC  81 mg Oral Daily   B-complex with vitamin C   1 tablet Oral Daily   calcium   carbonate  1 tablet Oral Q breakfast   carvedilol   6.25 mg Oral BID WC   cholecalciferol   2,000 Units Oral Daily   cyanocobalamin   1,000 mcg Intramuscular Q Sun   enoxaparin  (LOVENOX ) injection  40 mg Subcutaneous Q24H   feeding supplement  237 mL Oral BID BM   FLUoxetine   40 mg Oral Daily   fluticasone   1 spray Each Nare Daily   gabapentin   300 mg Oral Daily   And   gabapentin   600 mg Oral QHS   guaiFENesin   600 mg Oral BID   latanoprost   1 drop Both Eyes [id number]   loratadine   10 mg Oral Daily   methylphenidate   5 mg Oral Daily   multivitamin with minerals  1 tablet Oral Daily   omega-3 acid ethyl esters  1 g Oral Daily   pantoprazole   40 mg Oral Daily   rosuvastatin   20 mg Oral Daily   sodium bicarbonate   650 mg Oral BID   Continuous Infusions:  sodium chloride  75 mL/hr at 09/14/23 9277   ampicillin -sulbactam (UNASYN ) IV 3 g (09/14/23 1314)    LOS: 1 day   Alejandro Marker, DO  [facility] Available via Epic secure chat 7am-7pm After these hours, please refer to coverage provider listed on [email or web address] 09/14/2023, 7:39 PM

## 2023-09-14 NOTE — Plan of Care (Signed)

## 2023-09-14 NOTE — Progress Notes (Signed)
 Physical Therapy Treatment Patient Details Name: Michelle Johnston MRN: [id number] DOB: [birth year] Today's Date: 09/14/2023   History of Present Illness Pt is a 73 y/o female presenting with dizziness and dyspnea. Found to have hypokalemia, hyponatremia and elevated anion gap metabolic acidosis. CT head negative, negative for PE. THC+. 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 Pt with improved stability with ambulation when using RW. Pt aware and adherent to NWB of L LE other than use of RW for ambulation. Pt began wrist and elbow AROM, pt reports pain up to shoulder with wrist extension. Acute PT to cont to follow. Pt to benefit from HHPT to progress towards indep mobility.   If plan is discharge home, recommend the following: A little help with walking and/or transfers;Help with stairs or ramp for entrance;Assistance with cooking/housework;Assist for transportation   Can travel by private vehicle        Equipment Recommendations  None recommended by PT    Recommendations for Other Services       Precautions / Restrictions Precautions Precautions: Fall;Shoulder Shoulder Interventions: Shoulder sling/immobilizer Precaution Booklet Issued: Yes (comment) Precaution/Restrictions Comments: no A/PROM of shoulder, ok for hand/wrist/elbow AROM Required Braces or Orthoses: Sling Restrictions Weight Bearing Restrictions Per Provider Order: Yes LUE Weight Bearing Per Provider Order: Non weight bearing Other Position/Activity Restrictions: NWB per op note however per ortho note can use RW to improve safety with mobility     Mobility  Bed Mobility Overal bed mobility: Needs Assistance Bed Mobility: Supine to Sit     Supine to sit: Min assist     General bed mobility comments: minA for trunk elevation due to inability to use L UE    Transfers Overall  transfer level: Needs assistance Equipment used: 1 person hand held assist Transfers: Sit to/from Stand Sit to Stand: Contact guard assist           General transfer comment: verbal cues for safe hand placement    Ambulation/Gait Ambulation/Gait assistance: Contact guard assist, Min assist Gait Distance (Feet): 120 Feet (x1, 80x1) Assistive device: Rolling walker (2 wheels), Straight cane Gait Pattern/deviations: Antalgic, Step-to pattern, Decreased stance time - right Gait velocity: decreased Gait velocity interpretation: <1.31 ft/sec, indicative of household ambulator   General Gait Details: pt with good stability with use of RW. trialed SPC however pt with decreased step height, trendelenburg gait, antalgia   Stairs             Wheelchair Mobility     Tilt Bed    Modified Rankin (Stroke Patients Only)       Balance Overall balance assessment: Needs assistance Sitting-balance support: Single extremity supported Sitting balance-Leahy Scale: Good     Standing balance support: Single extremity supported, During functional activity Standing balance-Leahy Scale: Poor Standing balance comment: reliant on UE support for balance.                            Communication Communication Communication: No apparent difficulties  Cognition Arousal: Alert Behavior During Therapy: WFL for tasks assessed/performed   PT - Cognitive impairments: No apparent impairments                         Following commands: Intact      Cueing Cueing Techniques: Verbal cues  Exercises General Exercises -  Upper Extremity Elbow Flexion: AAROM, Both, 10 reps, Seated Wrist Extension: AROM, Left, 10 reps, Seated Digit Composite Flexion: AROM, Left, 10 reps, Seated    General Comments General comments (skin integrity, edema, etc.): L shld incision with dressing, bilat UEs wit bruising      Pertinent Vitals/Pain Pain Assessment Pain Assessment:  Faces Faces Pain Scale: Hurts little more Pain Location: L shoulder Pain Descriptors / Indicators: Grimacing, Guarding Pain Intervention(s): Monitored during session    Home Living                          Prior Function            PT Goals (current goals can now be found in the care plan section) Acute Rehab PT Goals Patient Stated Goal: to improve mobility and decrease pain PT Goal Formulation: With patient Time For Goal Achievement: 09/26/23 Potential to Achieve Goals: Good Progress towards PT goals: Progressing toward goals    Frequency    Min 2X/week      PT Plan      Co-evaluation              AM-PAC PT 6 Clicks Mobility   Outcome Measure  Help needed turning from your back to your side while in a flat bed without using bedrails?: A Little Help needed moving from lying on your back to sitting on the side of a flat bed without using bedrails?: A Little Help needed moving to and from a bed to a chair (including a wheelchair)?: A Little Help needed standing up from a chair using your arms (e.g., wheelchair or bedside chair)?: A Little Help needed to walk in hospital room?: A Little Help needed climbing 3-5 steps with a railing? : A Lot 6 Click Score: 17    End of Session Equipment Utilized During Treatment: Gait belt Activity Tolerance: Patient tolerated treatment well;Patient limited by pain;Patient limited by fatigue Patient left: in bed;with call bell/phone within reach;with family/visitor present (sitting EOB) Nurse Communication: Mobility status PT Visit Diagnosis: Unsteadiness on feet (R26.81);Other abnormalities of gait and mobility (R26.89);Muscle weakness (generalized) (M62.81);Pain Pain - Right/Left: Right Pain - part of body: Shoulder;Hip     Time: 9055-8998 PT Time Calculation (min) (ACUTE ONLY): 17 min  Charges:    $Gait Training: 8-22 mins PT General Charges $$ ACUTE PT VISIT: 1 Visit                     Norene Ames, PT, DPT Acute Rehabilitation Services Secure chat preferred Office #: [phone]    Norene CHRISTELLA Ames 09/14/2023, 11:58 AM

## 2023-09-14 NOTE — Progress Notes (Signed)
 Mobility Specialist: Progress Note   09/14/23 1629  Mobility  Activity Ambulated with assistance in hallway  Level of Assistance Contact guard assist, steadying assist  Assistive Device Front wheel walker  Distance Ambulated (ft) 90 ft  LUE Weight Bearing Per Provider Order NWB  Activity Response Tolerated well  Mobility Referral Yes  Mobility visit 1 Mobility  Mobility Specialist Start Time (ACUTE ONLY) 1415  Mobility Specialist Stop Time (ACUTE ONLY) 1430  Mobility Specialist Time Calculation (min) (ACUTE ONLY) 15 min    Pt received in bed, agreeable to mobility session. CGA throughout, pt only WB through LUE for RW use. No other complaints besides LUE pain. Returned to room without fault. Left in bed with all needs met, call bell in reach.   Ileana Lute Mobility Specialist Please contact via SecureChat or Rehab office at [phone]

## 2023-09-15 ENCOUNTER — Ambulatory Visit: Admitting: Physical Therapy

## 2023-09-15 DIAGNOSIS — E876 Hypokalemia: Secondary | ICD-10-CM | POA: Diagnosis not present

## 2023-09-15 DIAGNOSIS — E860 Dehydration: Secondary | ICD-10-CM

## 2023-09-15 DIAGNOSIS — R55 Syncope and collapse: Secondary | ICD-10-CM | POA: Diagnosis not present

## 2023-09-15 DIAGNOSIS — R42 Dizziness and giddiness: Secondary | ICD-10-CM | POA: Diagnosis not present

## 2023-09-15 LAB — CBC WITH DIFFERENTIAL/PLATELET
Abs Immature Granulocytes: 0.03 K/uL (ref 0.00–0.07)
Basophils Absolute: 0 K/uL (ref 0.0–0.1)
Basophils Relative: 1 %
Eosinophils Absolute: 0.2 K/uL (ref 0.0–0.5)
Eosinophils Relative: 3 %
HCT: 30.7 % — ABNORMAL LOW (ref 36.0–46.0)
Hemoglobin: 10.5 g/dL — ABNORMAL LOW (ref 12.0–15.0)
Immature Granulocytes: 1 %
Lymphocytes Relative: 17 %
Lymphs Abs: 1.1 K/uL (ref 0.7–4.0)
MCH: 34.3 pg — ABNORMAL HIGH (ref 26.0–34.0)
MCHC: 34.2 g/dL (ref 30.0–36.0)
MCV: 100.3 fL — ABNORMAL HIGH (ref 80.0–100.0)
Monocytes Absolute: 0.5 K/uL (ref 0.1–1.0)
Monocytes Relative: 8 %
Neutro Abs: 4.5 K/uL (ref 1.7–7.7)
Neutrophils Relative %: 70 %
Platelets: 418 K/uL — ABNORMAL HIGH (ref 150–400)
RBC: 3.06 MIL/uL — ABNORMAL LOW (ref 3.87–5.11)
RDW: 12.3 % (ref 11.5–15.5)
WBC: 6.4 K/uL (ref 4.0–10.5)
nRBC: 0 % (ref 0.0–0.2)

## 2023-09-15 LAB — COMPREHENSIVE METABOLIC PANEL WITH GFR
ALT: 24 U/L (ref 0–44)
AST: 20 U/L (ref 15–41)
Albumin: 2.9 g/dL — ABNORMAL LOW (ref 3.5–5.0)
Alkaline Phosphatase: 88 U/L (ref 38–126)
Anion gap: 11 (ref 5–15)
BUN: 6 mg/dL — ABNORMAL LOW (ref 8–23)
CO2: 23 mmol/L (ref 22–32)
Calcium: 8.8 mg/dL — ABNORMAL LOW (ref 8.9–10.3)
Chloride: 99 mmol/L (ref 98–111)
Creatinine, Ser: 0.33 mg/dL — ABNORMAL LOW (ref 0.44–1.00)
GFR, Estimated: >60 mL/min (ref >60)
Glucose, Bld: 99 mg/dL (ref 70–99)
Potassium: 3.6 mmol/L (ref 3.5–5.1)
Sodium: 133 mmol/L — ABNORMAL LOW (ref 135–145)
Total Bilirubin: 0.6 mg/dL (ref 0.0–1.2)
Total Protein: 5.7 g/dL — ABNORMAL LOW (ref 6.5–8.1)

## 2023-09-15 LAB — CULTURE, BLOOD (ROUTINE X 2): Special Requests: ADEQUATE

## 2023-09-15 LAB — MAGNESIUM: Magnesium: 1.8 mg/dL (ref 1.7–2.4)

## 2023-09-15 LAB — PHOSPHORUS: Phosphorus: 3.7 mg/dL (ref 2.5–4.6)

## 2023-09-15 MED ORDER — AMOXICILLIN-POT CLAVULANATE 875-125 MG PO TABS: 1.0000 | ORAL_TABLET | Freq: Two times a day (BID) | ORAL | 0 refills | Status: AC

## 2023-09-15 NOTE — TOC Transition Note (Signed)
 Transition of Care [facility]) - Discharge Note   Patient Details  Name: Michelle Johnston MRN: [id number] Date of Birth: [birth year]  [facility]) CM/SW Contact:  Rosaline JONELLE Joe, RN Phone Number: 09/15/2023, 12:04 PM   Clinical Narrative:    Patient is medically stable to discharge home today.  Patient is set up with [facility] agency notified that patient is discharging home.  No other DME needed.  Spouse is in the hospital room to provide transportation to home.  No other IP Care Management needs at this time.         Patient Goals and CMS Choice            Discharge Placement                       Discharge Plan and Services Additional resources added to the After Visit Summary for                                       Social Drivers of Health (SDOH) Interventions SDOH Screenings   Food Insecurity: No Food Insecurity (09/11/2023)  Housing: High Risk (09/11/2023)  Transportation Needs: No Transportation Needs (09/11/2023)  Utilities: Not At Risk (09/11/2023)  Social Connections: Socially Integrated (09/11/2023)  Tobacco Use: Medium Risk (09/11/2023)     Readmission Risk Interventions     No data to display

## 2023-09-15 NOTE — Progress Notes (Signed)
 Physical Therapy Treatment Patient Details Name: Michelle Johnston MRN: [id number] DOB: 1950/12/08 Today's Date: 09/15/2023   History of Present Illness Pt is a 73 y/o female presenting with dizziness and dyspnea. Found to have hypokalemia, hyponatremia and elevated anion gap metabolic acidosis. CT head negative, negative for PE. THC+. 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 Pt did practice stair negotiation however very difficult trying to go up side ways with L HR due to very weak R LE (LE in which she had to lead with) and dependence on R UE only to pull self up. Pt only with L HR on stairs going up to bedroom. Recommend pt stay on first floor until HHPT can come out to house and address the stairs in person. Pt did demo safe negotiation of 2 stairs to enter home as she has bilat HRs. Educated pt and spouse on ascending with L LE (stronger LE) and descending with R LE first (weaker LE). Both with return verbal confirmation. Acute PT to cont to follow.    If plan is discharge home, recommend the following: A little help with walking and/or transfers;Help with stairs or ramp for entrance;Assistance with cooking/housework;Assist for transportation   Can travel by private vehicle        Equipment Recommendations  None recommended by PT    Recommendations for Other Services       Precautions / Restrictions Precautions Precautions: Fall;Shoulder Shoulder Interventions: Shoulder sling/immobilizer Precaution Booklet Issued: Yes (comment) Precaution/Restrictions Comments: no A/PROM of shoulder, ok for hand/wrist/elbow AROM Required Braces or Orthoses: Sling Restrictions Weight Bearing Restrictions Per Provider Order: Yes LUE Weight Bearing Per Provider Order: Non weight bearing Other Position/Activity Restrictions: NWB per op note however per ortho note can use RW  to improve safety with mobility     Mobility  Bed Mobility Overal bed mobility: Needs Assistance Bed Mobility: Supine to Sit     Supine to sit: Min assist     General bed mobility comments: minA for trunk elevation due to inability to use L UE    Transfers Overall transfer level: Needs assistance Equipment used: Rolling walker (2 wheels) Transfers: Sit to/from Stand Sit to Stand: Contact guard assist, Min assist           General transfer comment: verbal cues for safe hand placement, minA to power up from commode as handrail on the L side and pt unable to use L UE    Ambulation/Gait Ambulation/Gait assistance: Contact guard assist, Min assist Gait Distance (Feet): 200 Feet Assistive device: Rolling walker (2 wheels), Straight cane Gait Pattern/deviations: Antalgic, Step-to pattern, Decreased stance time - right, Trendelenburg Gait velocity: decreased Gait velocity interpretation: <1.31 ft/sec, indicative of household ambulator   General Gait Details: pt with R hip/knee weakness resulting in trendelenburg gait, suggested pt amb leading with R LE first to allow for increased stability/support from UE on RW   Stairs Stairs: Yes Stairs assistance: Min assist Stair Management: One rail Right, Step to pattern, Forwards (2 steps with R HR to mimic home set up of 2 STE with bilat HR, did steps going down with HR on R) Number of Stairs: 2 General stair comments: attempted to ascend stairs sideways with L HR to mimic 11 stairs up to bedroom however pt having to pull/rely extensively on  R UE due to significant R LE weakness to pull self up  and pt unable to do more than 2. suggested pt stay on first floor until HHPT comes out to address stairs up to bedroom.   Wheelchair Mobility     Tilt Bed    Modified Rankin (Stroke Patients Only)       Balance Overall balance assessment: Needs assistance Sitting-balance support: Single extremity supported Sitting balance-Leahy  Scale: Good     Standing balance support: Single extremity supported, During functional activity Standing balance-Leahy Scale: Poor Standing balance comment: reliant on UE support for balance.                            Communication Communication Communication: No apparent difficulties  Cognition Arousal: Alert Behavior During Therapy: WFL for tasks assessed/performed   PT - Cognitive impairments: No apparent impairments                         Following commands: Intact      Cueing Cueing Techniques: Verbal cues  Exercises      General Comments General comments (skin integrity, edema, etc.): assist to commode, pt able to perform pericare in sitting, VSS      Pertinent Vitals/Pain Pain Assessment Pain Assessment: Faces Faces Pain Scale: Hurts little more Pain Location: L shoulder Pain Descriptors / Indicators: Grimacing, Guarding    Home Living                          Prior Function            PT Goals (current goals can now be found in the care plan section) Acute Rehab PT Goals Patient Stated Goal: to improve mobility and decrease pain PT Goal Formulation: With patient Time For Goal Achievement: 09/26/23 Potential to Achieve Goals: Good Progress towards PT goals: Progressing toward goals    Frequency    Min 2X/week      PT Plan      Co-evaluation              AM-PAC PT 6 Clicks Mobility   Outcome Measure  Help needed turning from your back to your side while in a flat bed without using bedrails?: A Little Help needed moving from lying on your back to sitting on the side of a flat bed without using bedrails?: A Little Help needed moving to and from a bed to a chair (including a wheelchair)?: A Little Help needed standing up from a chair using your arms (e.g., wheelchair or bedside chair)?: A Little Help needed to walk in hospital room?: A Little Help needed climbing 3-5 steps with a railing? : A Lot 6  Click Score: 17    End of Session Equipment Utilized During Treatment: Gait belt Activity Tolerance: Patient tolerated treatment well Patient left: in bed;with call bell/phone within reach;with family/visitor present (sitting EOB) Nurse Communication: Mobility status PT Visit Diagnosis: Unsteadiness on feet (R26.81);Other abnormalities of gait and mobility (R26.89);Muscle weakness (generalized) (M62.81);Pain Pain - Right/Left: Right Pain - part of body: Shoulder;Hip     Time: 8984-8958 PT Time Calculation (min) (ACUTE ONLY): 26 min  Charges:    $Gait Training: 23-37 mins PT General Charges $$ ACUTE PT VISIT: 1 Visit                     Norene Ames, PT, DPT Acute Rehabilitation Services Secure chat preferred Office #: [phone]    Norene CHRISTELLA Ames  09/15/2023, 11:10 AM

## 2023-09-15 NOTE — Progress Notes (Signed)
 Occupational Therapy Treatment Patient Details Name: Michelle Johnston MRN: [id number] DOB: [birth year] Today's Date: 09/15/2023   History of present illness Pt is a 73 y/o female presenting with dizziness and dyspnea. Found to have hypokalemia, hyponatremia and elevated anion gap metabolic acidosis. CT head negative, negative for PE. THC+. 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 Patient was min assist to get to EOB due to assistance needed with trunk. Patient was min/CGA for sit to stand from EOB and to ambulate to bathroom. Patient able to perform clothing management and hygiene seated and stood to manage clothing with CGA to min assist. Patient performed grooming tasks while standing at sink using RUE. Patient able to doff sling and performed elbow and hand ROM exercises and min assist to donn sling. Patient able to return to supine with CGA. Discharge recommendations continue to be appropriate. Acute OT to continue to follow to address established goals.       If plan is discharge home, recommend the following:  A little help with bathing/dressing/bathroom;Assistance with cooking/housework;Assist for transportation;Help with stairs or ramp for entrance   Equipment Recommendations  None recommended by OT    Recommendations for Other Services      Precautions / Restrictions Precautions Precautions: Fall;Shoulder Shoulder Interventions: Shoulder sling/immobilizer Precaution Booklet Issued: Yes (comment) Precaution/Restrictions Comments: no A/PROM of shoulder, ok for hand/wrist/elbow AROM Required Braces or Orthoses: Sling Restrictions Weight Bearing Restrictions Per Provider Order: Yes LUE Weight Bearing Per Provider Order: Non weight bearing Other Position/Activity Restrictions: NWB per op note however per ortho note can use RW to improve safety with mobility        Mobility Bed Mobility Overal bed mobility: Needs Assistance Bed Mobility: Supine to Sit, Sit to Supine     Supine to sit: Min assist Sit to supine: Contact guard assist   General bed mobility comments: min assist with trunk due to unable to use LUE, able to manage trunk and BLEs to return to supine    Transfers Overall transfer level: Needs assistance Equipment used: Rolling walker (2 wheels) Transfers: Sit to/from Stand Sit to Stand: Contact guard assist, Min assist           General transfer comment: CGA to min assist to power up and for stability     Balance Overall balance assessment: Needs assistance Sitting-balance support: Single extremity supported Sitting balance-Leahy Scale: Good     Standing balance support: Single extremity supported, During functional activity Standing balance-Leahy Scale: Poor Standing balance comment: reliant on UE support for balance.                           ADL either performed or assessed with clinical judgement   ADL Overall ADL's : Needs assistance/impaired     Grooming: Set up;Standing Grooming Details (indicate cue type and reason): at sink         Upper Body Dressing : Minimal assistance;Sitting Upper Body Dressing Details (indicate cue type and reason): to donn sling Lower Body Dressing: Minimal assistance;Sitting/lateral leans;Sit to/from stand Lower Body Dressing Details (indicate cue type and reason): assist for socks Toilet Transfer: Contact guard assist;Ambulation;Regular Toilet;Rolling walker (2 wheels) Toilet Transfer Details (indicate cue type and reason): cues for safety Toileting- Clothing Manipulation and Hygiene: Supervision/safety;Sit to/from stand;Sitting/lateral lean Toileting - Clothing Manipulation Details (indicate cue type and reason): patient able  to perform toilet hygiene seated  and clothing management while standing using RUE only            Extremity/Trunk Assessment  Upper Extremity Assessment Upper Extremity Assessment: Right hand dominant LUE Deficits / Details: reverse TSA 7/3. bruising throughout UE. hand/wrist/elbow [facility]            Vision       Perception     Praxis     Communication Communication Communication: No apparent difficulties   Cognition Arousal: Alert Behavior During Therapy: WFL for tasks assessed/performed Cognition: No apparent impairments                               Following commands: Intact        Cueing   Cueing Techniques: Verbal cues  Exercises Exercises: General Upper Extremity General Exercises - Upper Extremity Elbow Flexion: AAROM, Both, 10 reps, Seated Wrist Extension: AROM, Left, 10 reps, Seated Digit Composite Flexion: AROM, Left, 10 reps, Seated    Shoulder Instructions       General Comments assist to commode, pt able to perform pericare in sitting, VSS    Pertinent Vitals/ Pain       Pain Assessment Pain Assessment: Faces Faces Pain Scale: Hurts little more Pain Location: L shoulder Pain Descriptors / Indicators: Grimacing, Guarding Pain Intervention(s): Limited activity within patient's tolerance, Monitored during session, Repositioned, Ice applied  Home Living                                          Prior Functioning/Environment              Frequency  Min 2X/week        Progress Toward Goals  OT Goals(current goals can now be found in the care plan section)  Progress towards OT goals: Progressing toward goals  Acute Rehab OT Goals Patient Stated Goal: to go home OT Goal Formulation: With patient Time For Goal Achievement: 09/26/23 Potential to Achieve Goals: Good ADL Goals Pt Will Perform Upper Body Dressing: with modified independence;sitting Pt Will Perform Lower Body Dressing: with modified independence;sitting/lateral leans;sit to/from stand Additional ADL Goal #1: Pt to don L sling independently  Plan       Co-evaluation                 AM-PAC OT 6 Clicks Daily Activity     Outcome Measure   Help from another person eating meals?: None Help from another person taking care of personal grooming?: A Little Help from another person toileting, which includes using toliet, bedpan, or urinal?: A Little Help from another person bathing (including washing, rinsing, drying)?: A Little Help from another person to put on and taking off regular upper body clothing?: A Lot Help from another person to put on and taking off regular lower body clothing?: A Little 6 Click Score: 18    End of Session Equipment Utilized During Treatment: Gait belt;Rolling walker (2 wheels)  OT Visit Diagnosis: Muscle weakness (generalized) (M62.81);Other abnormalities of gait and mobility (R26.89);History of falling (Z91.81)   Activity Tolerance Patient tolerated treatment well   Patient Left in bed;with call bell/phone within reach;with bed alarm set   Nurse Communication Mobility status        Time: 9255-9188 OT Time Calculation (min): 27 min  Charges: OT General Charges $OT  Visit: 1 Visit OT Treatments $Self Care/Home Management : 8-22 mins $Therapeutic Exercise: 8-22 mins  Dick Laine, OTA Acute Rehabilitation Services  Office [phone]   Jeb LITTIE Laine 09/15/2023, 12:34 PM

## 2023-09-15 NOTE — Discharge Summary (Signed)
 Physician Discharge Summary   Patient: Michelle Johnston MRN: [id number] DOB: Feb 07, 1951  Admit date:     09/11/2023  Discharge date: 09/15/23  Discharge Physician: Nydia Distance, MD    PCP: Loreli Kins, MD   Recommendations at discharge:   Placed on Augmentin  875-125 mg p.o. twice daily for 10 days  Discharge Diagnoses:    Near syncope Fusobacterium bacteremia  Maxillary sinus disease Dyspnea Recent shoulder surgery Hypokalemia Hyponatremia Atrial fibrillation Normocytic anemia GERD    Hospital Course:  Patient is a 73 y.o. female with A-fib s/p ablation and Watchman, depression, anxiety, GERD, essential hypertension, sleep apnea noncompliant with CPAP, history of bradycardia, glaucoma and other comorbidities who presented to the hospital with dizziness and dyspnea.  Patient states that she was dizzy a few days ago and presented to the ED yesterday and given IV fluids and discharged home.  She woke up on the morning of admission and felt persistently dizzy with some shortness of breath but did not pass out. Had a fall a few weeks ago and had to have shoulder surgery last week.  Since her surgery her appetite has not been the best and symptoms have started worsening.   States that since her shoulder surgery last week her appetite has not been the best but she has been eating.  States that she may have had 1 episode of vomiting but no real nausea.  States that she also had some diarrhea for about a day.  No chest pain, dysuria or palpitations.      Assessment and Plan:  Near syncope - Patient was placed on IV fluids hydration, presented with persistent dizziness and near syncope. - Troponins negative, CT head unremarkable, MRI brain showed mild left maxillary sinus disease, no CVA. - CTA chest showed no pulmonary embolism.  Mildly enlarged ascending thoracic aorta measuring up to 4.2 cm, emphysema. - Ambulating with PT, recommended home health PT - TSH 2.1, UA negative for  UTI.  Fusobacterium bacteremia Initial blood cultures showed gram-negative rods, MRI brain had showed maxillary sinus disease, patient was placed on Augmentin  however transition to IV Unasyn  due to bacteremia. -CT abdomen pelvis showed no acute inflammatory process. - Repeat blood cultures have been negative so far - Blood cultures finally showed Fusobacterium nucleatum.  Discussed with infectious ease, Dr. Dennise, recommended Augmentin  for 10 days.  Dyspnea - Improved, CTA chest showed no PE.  Chest x-ray showed no pneumonia.  Recent shoulder surgery - Ortho was consulted.  Shoulder x-ray showed reverse shoulder arthroplasty without complication. She is to have NO ROM at L Shoulder and NWB w/ Left arm and in Sling all time. She is ok of AROM at L elbow base, wrist and fingers, and recommending not bearing weight on operative arm if she can walk with a cane in R hand.  - Orthopedic surgery recommended reinforcing dressings as needed and following up with orthopedic surgery within 1 week.   Hyponatremia Likely due to poor p.o. intake, received IV fluids, sodium improving, tolerating diet  NAG metabolic acidosis on admission - Likely due to dehydration, resolved  History of paroxysmal A-fib - Currently NSR, continue Coreg    Anemia - H&H currently at baseline Anemia Panel and showed an iron level of 44, UIBC of 299, TIBC of 343, saturation ratios of 13%, ferritin level 144, folate level 34.7 and a vitamin B12 level 3160   GERD - Continue PPI     Pain control - [place]  Controlled Substance Reporting System database was reviewed. and  patient was instructed, not to drive, operate heavy machinery, perform activities at heights, swimming or participation in water  activities or provide baby-sitting services while on Pain, Sleep and Anxiety Medications; until their outpatient Physician has advised to do so again. Also recommended to not to take more than prescribed Pain, Sleep and  Anxiety Medications.  Consultants: Infectious disease Procedures performed: None Disposition: Home Diet recommendation: Regular diet  DISCHARGE MEDICATION: Allergies as of 09/15/2023       Reactions   Dexlansoprazole Other (See Comments)   ineffective   Flecainide  Other (See Comments)   Pt had stroke Possible stroke   Nasal Spray Other (See Comments)   Nasal Steroids, Head Congestion   Zetia  [ezetimibe ] Nausea Only   Cefaclor Hives, Rash   Ciprofibrate Rash   (see OV 12/24/2011)   Ciprofloxacin  Hives   Metronidazole  Hives   Other reaction(s): rash ?? (see OV 12/24/2011)        Medication List     STOP taking these medications    celecoxib 200 MG capsule Commonly known as: CELEBREX       TAKE these medications    acetaminophen  500 MG tablet Commonly known as: TYLENOL  Take 1,000 mg by mouth 3 (three) times daily as needed for moderate pain (pain score 4-6) or mild pain (pain score 1-3).   amLODipine  10 MG tablet Commonly known as: NORVASC  Take 10 mg by mouth at bedtime.   amoxicillin -clavulanate 875-125 MG tablet Commonly known as: AUGMENTIN  Take 1 tablet by mouth 2 (two) times daily for 10 days.   ascorbic acid  500 MG tablet Commonly known as: VITAMIN C  Take 500 mg by mouth daily.   aspirin  EC 81 MG tablet Take 1 tablet (81 mg total) by mouth daily. Swallow whole. What changed: when to take this   b complex vitamins tablet Take 1 tablet by mouth daily.   bisacodyl  10 MG suppository Commonly known as: DULCOLAX Place 1 suppository (10 mg total) rectally daily as needed for moderate constipation.   calcium  carbonate 1250 (500 Ca) MG tablet Commonly known as: OS-CAL - dosed in mg of elemental calcium  Take 1 tablet by mouth daily with breakfast.   carvedilol  6.25 MG tablet Commonly known as: COREG  TAKE 1 TABLET BY MOUTH TWICE DAILY WITH A MEAL   cetirizine 10 MG tablet Commonly known as: ZYRTEC Take 10 mg by mouth at bedtime.   cyanocobalamin   1000 MCG/ML injection Commonly known as: VITAMIN B12 Inject 1,000 mcg into the muscle once a week.   feeding supplement Liqd Take 237 mLs by mouth 2 (two) times daily between meals.   Fish Oil 1000 MG Caps Take 1,000 mg by mouth daily.   FLUoxetine  40 MG capsule Commonly known as: PROZAC  Take 40 mg by mouth daily.   fluticasone  50 MCG/ACT nasal spray Commonly known as: FLONASE  Place 1 spray into both nostrils daily.   gabapentin  300 MG capsule Commonly known as: NEURONTIN  Take 300-600 mg by mouth See admin instructions. Take 300 mg in the morning and 600 mg at night   guaiFENesin  600 MG 12 hr tablet Commonly known as: MUCINEX  Take 1 tablet (600 mg total) by mouth 2 (two) times daily for 5 days.   ipratropium 0.06 % nasal spray Commonly known as: ATROVENT Place 1 spray into both nostrils daily as needed for rhinitis.   latanoprost  0.005 % ophthalmic solution Commonly known as: XALATAN  Place 1 drop into both eyes every morning.   meloxicam  15 MG tablet Commonly known as: MOBIC  Take 15 mg  by mouth daily as needed for pain.   methylphenidate  10 MG tablet Commonly known as: RITALIN  Take 5 mg by mouth daily.   montelukast  10 MG tablet Commonly known as: SINGULAIR  Take 10 mg by mouth daily as needed (allergies).   multivitamin with minerals tablet Take 1 tablet by mouth daily.   nitroGLYCERIN  0.4 MG SL tablet Commonly known as: NITROSTAT  Place 0.4 mg under the tongue every 5 (five) minutes as needed for chest pain.   omeprazole 20 MG capsule Commonly known as: PRILOSEC Take 20 mg by mouth daily.   ondansetron  4 MG tablet Commonly known as: ZOFRAN  Take 1 tablet (4 mg total) by mouth every 6 (six) hours as needed for nausea.   oxycodone  5 MG capsule Commonly known as: OXY-IR Take 5 mg by mouth every 6 (six) hours as needed for pain.   polyethylene glycol 17 g packet Commonly known as: MIRALAX  / GLYCOLAX  Take 17 g by mouth daily as needed for mild  constipation.   rosuvastatin  20 MG tablet Commonly known as: CRESTOR  Take 1 tablet by mouth once daily   sodium bicarbonate  650 MG tablet Take 1 tablet (650 mg total) by mouth 2 (two) times daily for 3 days.   traMADol 50 MG tablet Commonly known as: ULTRAM Take 50 mg by mouth every 6 (six) hours as needed.   Vitamin D  50 MCG (2000 UT) tablet Take 2,000 Units by mouth daily.               Discharge Care Instructions  (From admission, onward)           Start     Ordered   09/15/23 0000  If the dressing is still on your incision site when you go home, remove it on the third day after your surgery date. Remove dressing if it begins to fall off, or if it is dirty or damaged before the third day.        09/15/23 1151            Follow-up Information     [facility], [facility] Follow up.   Specialty: Home Health Services Why: [facility] to provide home health services for PT. Contact information: [address] [place] [address] [phone]         Loreli Kins, MD. Schedule an appointment as soon as possible for a visit in 2 week(s).   Specialty: Family Medicine Why: for hospital follow-up Contact information: [address] [place] [address] [phone]                Discharge Exam: Fredricka Weights   09/11/23 2130  Weight: 67 kg   S: Feeling better, husband and friend at the bedside.  Hoping to go home today.  No fever chills, chills chest pain or shortness of breath.  No diarrhea.   BP (!) 160/49 (BP Location: Right Leg)   Pulse 64   Temp 97.9 F (36.6 C)   Resp 17   Ht 5' 5 (1.651 m)   Wt 67 kg   SpO2 98%   BMI 24.58 kg/m   Physical Exam General: Alert and oriented x 3, NAD Cardiovascular: S1 S2 clear, RRR.  Respiratory: CTAB, no wheezing Gastrointestinal: Soft, nontender, nondistended, NBS Ext: no pedal edema bilaterally Neuro: no new deficits Psych: Normal affect,  pleasant   Condition at discharge: fair  The results of significant diagnostics from this hospitalization (including imaging, microbiology, ancillary and laboratory) are listed  below for reference.   Imaging Studies: CT ABDOMEN PELVIS W CONTRAST Result Date: 09/13/2023 CLINICAL DATA:  Abdominal pain, acute, nonlocalized Nausea and Vomiting. EXAM: CT ABDOMEN AND PELVIS WITH CONTRAST TECHNIQUE: Multidetector CT imaging of the abdomen and pelvis was performed using the standard protocol following bolus administration of intravenous contrast. RADIATION DOSE REDUCTION: This exam was performed according to the departmental dose-optimization program which includes automated exposure control, adjustment of the mA and/or kV according to patient size and/or use of iterative reconstruction technique. CONTRAST:  75mL OMNIPAQUE  IOHEXOL  350 MG/ML SOLN COMPARISON:  CT scan abdomen and pelvis from 04/21/2023. FINDINGS: Lower chest: There are dependent changes in the visualized lung bases. No overt consolidation. No pleural effusion. The heart is normal in size. No pericardial effusion. Hepatobiliary: The liver is normal in size. Non-cirrhotic configuration. No suspicious mass. A 6 mm calcified granuloma noted in the right hepatic lobe. No intrahepatic or extrahepatic bile duct dilation. No calcified gallstones. Normal gallbladder wall thickness. No pericholecystic inflammatory changes. Pancreas: Unremarkable. No pancreatic ductal dilatation or surrounding inflammatory changes. Spleen: Within normal limits. No focal lesion. Adrenals/Urinary Tract: Adrenal glands are unremarkable. No suspicious renal mass. No hydronephrosis. Bilateral extrarenal pelves noted. No renal or ureteric calculi. Mild bladder wall trabeculations noted, nonspecific with commonly seen as a sequela of urinary outflow obstruction. No focal mass or bladder calculi seen. Stomach/Bowel: No disproportionate dilation of the small or large bowel loops. No  evidence of abnormal bowel wall thickening or inflammatory changes. The appendix is unremarkable. There are scattered diverticula mainly in the left hemi colon, without imaging signs of diverticulitis. Vascular/Lymphatic: No ascites or pneumoperitoneum. No abdominal or pelvic lymphadenopathy, by size criteria. No aneurysmal dilation of the major abdominal arteries. There are moderate peripheral atherosclerotic vascular calcifications of the aorta and its major branches. Reproductive: The uterus is surgically absent. No large adnexal mass. Other: There is a tiny fat containing umbilical hernia. The soft tissues and abdominal wall are otherwise unremarkable. Musculoskeletal: No suspicious osseous lesions. There are moderate multilevel degenerative changes in the visualized spine. IMPRESSION: 1. No acute inflammatory process identified within the abdomen or pelvis. 2. Multiple other nonacute observations, as described above. Aortic Atherosclerosis (ICD10-I70.0). Electronically Signed   By: Ree Molt M.D.   On: 09/13/2023 16:50   DG Shoulder Left Port Result Date: 09/12/2023 CLINICAL DATA:  Status post reverse shoulder arthroplasty. EXAM: LEFT SHOULDER COMPARISON:  Radiograph 09/02/2023, CT 08/31/2023 FINDINGS: Reverse shoulder arthroplasty in expected alignment. Fracture fragment adjacent to the humeral known into is grossly unchanged from prior. No periprosthetic lucency. Stable acromioclavicular alignment. Resolving soft tissue edema with resolved soft tissue gas. IMPRESSION: Reverse shoulder arthroplasty without complication. Electronically Signed   By: Andrea Gasman M.D.   On: 09/12/2023 14:21   X-ray chest PA and lateral Result Date: 09/12/2023 CLINICAL DATA:  Dyspnea. EXAM: CHEST - 2 VIEW COMPARISON:  09/11/2023 FINDINGS: The lungs are clear without focal pneumonia, edema, pneumothorax or pleural effusion. The cardiopericardial silhouette is within normal limits for size. No acute bony abnormality.  Status post left shoulder replacement. Telemetry leads overlie the chest. IMPRESSION: No active cardiopulmonary disease. Electronically Signed   By: Camellia Candle M.D.   On: 09/12/2023 08:29   MR BRAIN WO CONTRAST Result Date: 09/11/2023 CLINICAL DATA:  Neuro deficit, acute, stroke suspected EXAM: MRI HEAD WITHOUT CONTRAST TECHNIQUE: Multiplanar, multiecho pulse sequences of the brain and surrounding structures were obtained without intravenous contrast. COMPARISON:  CT of the head dated September 10, 2023. FINDINGS: Brain: There is  no restricted diffusion to indicate acute or recent infarction. There is minimal subcortical white matter disease present. No evidence of hemorrhage, mass or hydrocephalus. Vascular: Normal flow voids. Skull and upper cervical spine: Normal marrow signal. Sinuses/Orbits: Mild mucosal disease within the left maxillary sinus. Status post bilateral lens replacement. Other: None. IMPRESSION: 1. Minimal nonspecific subcortical white matter disease. Otherwise, normal brain. 2. Mild left maxillary sinus disease. Electronically Signed   By: Evalene Coho M.D.   On: 09/11/2023 18:36   CT Angio Chest PE W and/or Wo Contrast Result Date: 09/11/2023 CLINICAL DATA:  Dizziness, pulmonary embolism suspected, positive D-dimer EXAM: CT ANGIOGRAPHY CHEST WITH CONTRAST TECHNIQUE: Multidetector CT imaging of the chest was performed using the standard protocol during bolus administration of intravenous contrast. Multiplanar CT image reconstructions and MIPs were obtained to evaluate the vascular anatomy. RADIATION DOSE REDUCTION: This exam was performed according to the departmental dose-optimization program which includes automated exposure control, adjustment of the mA and/or kV according to patient size and/or use of iterative reconstruction technique. CONTRAST:  75mL OMNIPAQUE  IOHEXOL  350 MG/ML SOLN COMPARISON:  Same day chest radiograph and prior studies FINDINGS: Cardiovascular: Satisfactory  opacification of the pulmonary arteries to the segmental level. No evidence of pulmonary embolism. Unchanged cardiomegaly. Mildly enlarged ascending thoracic aorta, measuring up to 4.2 cm. Left atrial appendage occlusion device. No pericardial effusion. Mediastinum/Nodes: No lymphadenopathy. Lungs/Pleura: Emphysema. Stable tiny bilateral pulmonary nodules measuring up to 0.2 cm. No focal consolidation. Accessory azygous lobe with azygous vein traversing through the azygous fissure. No pleural effusion or pneumothorax. Upper Abdomen: No acute abnormality. Musculoskeletal: No acute osseous findings. Multilevel intervertebral changes with anterolisthesis of C7 on T1. Review of the MIP images confirms the above findings. IMPRESSION: 1. No pulmonary embolism identified. 2. Mildly enlarged ascending thoracic aorta measuring up to 4.2 cm. Emphysema (ICD10-J43.9). Electronically Signed   By: Michaeline Blanch M.D.   On: 09/11/2023 14:47   DG Chest Port 1 View Result Date: 09/11/2023 CLINICAL DATA:  Questionable sepsis - evaluate for abnormality EXAM: PORTABLE CHEST 1 VIEW COMPARISON:  August 07, 2021 FINDINGS: The cardiomediastinal silhouette is unchanged in contour.Watchman device. No pleural effusion. No pneumothorax. Azygous fissure. No acute pleuroparenchymal abnormality. Status post LEFT shoulder arthroplasty IMPRESSION: No acute cardiopulmonary abnormality. Electronically Signed   By: Corean Salter M.D.   On: 09/11/2023 12:32   CT Head Wo Contrast Result Date: 09/10/2023 CLINICAL DATA:  Headache, dizziness, altered mental status. EXAM: CT HEAD WITHOUT CONTRAST TECHNIQUE: Contiguous axial images were obtained from the base of the skull through the vertex without intravenous contrast. RADIATION DOSE REDUCTION: This exam was performed according to the departmental dose-optimization program which includes automated exposure control, adjustment of the mA and/or kV according to patient size and/or use of iterative  reconstruction technique. COMPARISON:  07/01/2023 FINDINGS: Brain: The brainstem, cerebellum, cerebral peduncles, thalami, basal ganglia, basilar cisterns, and ventricular system appear within normal limits. No intracranial hemorrhage, mass lesion, or acute CVA. Vascular: There is atherosclerotic calcification of the cavernous carotid arteries bilaterally. 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 Other: Stable subcutaneous lesion in the left posterior parietal scalp on image 29 series 2, probably a dense a basis cyst or similar benign lesion. No change from prior. IMPRESSION: 1. No acute intracranial findings. 2. Mild chronic right posterior ethmoid sinusitis. 3. Prior left maxillary antrostomy. 4. Focal calcification along the right optic disc compatible with small optic  drusen. 5. Stable subcutaneous lesion in the left posterior parietal scalp, probably a dense sebaceous cyst or similar benign lesion. Electronically Signed   By: Ryan Salvage M.D.   On: 09/10/2023 21:13   DG Shoulder Left Port Result Date: 09/02/2023 CLINICAL DATA:  Postoperative check left shoulder. EXAM: LEFT SHOULDER COMPARISON:  CT left shoulder 08/31/2023 FINDINGS: Interval reverse total left shoulder arthroplasty. There is a bone fragment measuring up to 3 cm in length there may represent the displaced greater tuberosity fracture component seen on prior CT. No perihardware lucency is seen to indicate hardware failure or loosening on limited frontal view. Expected postoperative mild soft tissue air superior to the shoulder joint. Normal alignment of the acromioclavicular joint with mild peripheral osteophytes. Surgical changes are seen within the left heart, likely a atrial appendage occlusion device. IMPRESSION: Interval reverse total left shoulder arthroplasty. No evidence of  hardware failure or loosening. Electronically Signed   By: Tanda Lyons M.D.   On: 09/02/2023 13:02   CT SHOULDER LEFT WO CONTRAST Result Date: 08/31/2023 CLINICAL DATA:  Shoulder pain after falling yesterday. Proximal left humeral fracture. EXAM: CT OF THE UPPER LEFT EXTREMITY WITHOUT CONTRAST TECHNIQUE: Multidetector CT imaging of the left shoulder was performed according to the standard protocol. RADIATION DOSE REDUCTION: This exam was performed according to the departmental dose-optimization program which includes automated exposure control, adjustment of the mA and/or kV according to patient size and/or use of iterative reconstruction technique. COMPARISON:  None Available. FINDINGS: Bones/Joint/Cartilage Acute fracture of the left humeral neck is associated with up to 12 mm of posteromedial displacement. Fracture stones into the greater tuberosity. There is no significant involvement of the humeral head articular surface. The humeral head remains located. There is a small shoulder joint effusion with small fracture fragments in the joint. No significant underlying glenohumeral arthropathy. Mild-to-moderate acromioclavicular degenerative changes. The left clavicle and scapula appear intact. Ligaments Suboptimally assessed by CT. Muscles and Tendons There is some edema anteriorly within the deltoid muscle. The rotator cuff musculature appears unremarkable. Soft tissues Anterior and lateral subcutaneous edema without focal fluid collection, foreign body or soft tissue emphysema. No acute findings are identified within the visualized left chest. There are postsurgical changes from left atrial appendage occluder placement. Aortic atherosclerosis and scattered air cysts within the left lung are noted. IMPRESSION: 1. Acute displaced fracture of the left humeral neck with extension into the greater tuberosity. No significant involvement of the humeral head articular surface. 2. Small shoulder joint effusion with  small fracture fragments in the joint. 3. Anterior and lateral subcutaneous edema without focal fluid collection, foreign body or soft tissue emphysema. 4.  Aortic Atherosclerosis (ICD10-I70.0). Electronically Signed   By: Elsie Perone M.D.   On: 08/31/2023 13:49   ECHOCARDIOGRAM COMPLETE Result Date: 08/19/2023    ECHOCARDIOGRAM REPORT   Patient Name:   DOMINIGUE GELLNER   Date of Exam: 08/19/2023 Medical Rec #:  [id number]     Height:       65.0 in Accession #:    [id number]    Weight:       150.0 lb Date of Birth:  August 06, 1950     BSA:          1.750 m Patient Age:    73 years      BP:           122/71 mmHg Patient Gender: F             HR:  63 bpm. Exam Location:  [address] Procedure: 2D Echo, 3D Echo, Cardiac Doppler and Color Doppler (Both Spectral            and Color Flow Doppler were utilized during procedure). Indications:    I77.819 Aortic Dilation  History:        Patient has prior history of Echocardiogram examinations, most                 recent 11/09/2022. Abnormal ECG, Arrythmias:Bradycardia and Atrial                 Fibrillation, Signs/Symptoms:Dyspnea; Risk Factors:Hypertension,                 Family History of Coronary Artery Disease, Sleep Apnea and                 Former Smoker. Atrial Fibrillation status post Ablation (x2,                 2022 and 2024) with Watchman FLX 27mm Left Atrial Apendage                 Closure Device (2023).  Sonographer:    Heather Hawks RDCS Referring Phys: ONEIL JAYSON PARCHMENT IMPRESSIONS  1. Left ventricular ejection fraction, by estimation, is 60 to 65%. Left ventricular ejection fraction by 3D volume is 60 %. The left ventricle has normal function. The left ventricle has no regional wall motion abnormalities. Left ventricular diastolic  parameters are consistent with Grade II diastolic dysfunction (pseudonormalization).  2. Right ventricular systolic function is normal. The right ventricular size is normal.  3. Left atrial size was severely dilated.  4.  Right atrial size was moderately dilated.  5. The mitral valve is normal in structure. Mild mitral valve regurgitation. No evidence of mitral stenosis.  6. Tricuspid valve regurgitation is moderate.  7. The aortic valve is tricuspid. Aortic valve regurgitation is severe. No aortic stenosis is present. Aortic regurgitation PHT measures 490 msec.  8. Aortic dilatation noted. There is mild dilatation of the ascending aorta, measuring 41 mm.  9. The inferior vena cava is normal in size with greater than 50% respiratory variability, suggesting right atrial pressure of 3 mmHg. FINDINGS  Left Ventricle: Left ventricular ejection fraction, by estimation, is 60 to 65%. Left ventricular ejection fraction by 3D volume is 60 %. The left ventricle has normal function. The left ventricle has no regional wall motion abnormalities. The left ventricular internal cavity size was normal in size. There is no left ventricular hypertrophy. Left ventricular diastolic parameters are consistent with Grade II diastolic dysfunction (pseudonormalization). Right Ventricle: The right ventricular size is normal. No increase in right ventricular wall thickness. Right ventricular systolic function is normal. Left Atrium: Left atrial size was severely dilated. Right Atrium: Right atrial size was moderately dilated. Pericardium: There is no evidence of pericardial effusion. Mitral Valve: The mitral valve is normal in structure. Mild mitral valve regurgitation. No evidence of mitral valve stenosis. Tricuspid Valve: The tricuspid valve is normal in structure. Tricuspid valve regurgitation is moderate . No evidence of tricuspid stenosis. Aortic Valve: The aortic valve is tricuspid. Aortic valve regurgitation is severe. Aortic regurgitation PHT measures 490 msec. No aortic stenosis is present. Pulmonic Valve: The pulmonic valve was normal in structure. Pulmonic valve regurgitation is mild. No evidence of pulmonic stenosis. Aorta: Aortic dilatation noted.  There is mild dilatation of the ascending aorta, measuring 41 mm. Venous: The inferior vena cava is normal in size with  greater than 50% respiratory variability, suggesting right atrial pressure of 3 mmHg. IAS/Shunts: No atrial level shunt detected by color flow Doppler. Additional Comments: 3D was performed not requiring image post processing on an independent workstation and was normal.  LEFT VENTRICLE PLAX 2D LVIDd:         4.70 cm         Diastology LVIDs:         2.60 cm         LV e' medial:    6.41 cm/s LV PW:         1.20 cm         LV E/e' medial:  13.4 LV IVS:        1.00 cm         LV e' lateral:   10.20 cm/s LVOT diam:     2.30 cm         LV E/e' lateral: 8.4 LV SV:         114 LV SV Index:   65 LVOT Area:     4.15 cm        3D Volume EF                                LV 3D EF:    Left                                             ventricul                                             ar                                             ejection                                             fraction                                             by 3D                                             volume is                                             60 %.                                 3D Volume EF:  3D EF:        60 %                                LV EDV:       155 ml                                LV ESV:       62 ml                                LV SV:        93 ml RIGHT VENTRICLE RV Basal diam:  4.40 cm RV Mid diam:    3.30 cm RV S prime:     16.50 cm/s TAPSE (M-mode): 2.4 cm RVSP:           37.1 mmHg LEFT ATRIUM              Index        RIGHT ATRIUM           Index LA diam:        4.00 cm  2.29 cm/m   RA Pressure: 3.00 mmHg LA Vol (A2C):   69.4 ml  39.65 ml/m  RA Area:     22.60 cm LA Vol (A4C):   138.0 ml 78.84 ml/m  RA Volume:   78.50 ml  44.84 ml/m LA Biplane Vol: 105.0 ml 59.98 ml/m  AORTIC VALVE LVOT Vmax:   122.00 cm/s LVOT Vmean:  72.900 cm/s LVOT VTI:    0.275 m AI  PHT:      490 msec  AORTA Ao Root diam: 3.40 cm Ao Asc diam:  4.10 cm MITRAL VALVE                  TRICUSPID VALVE MV Area (PHT): cm            TR Peak grad:   34.1 mmHg MV Decel Time: 203 msec       TR Vmax:        292.00 cm/s MR Peak grad:    129.3 mmHg   Estimated RAP:  3.00 mmHg MR Mean grad:    82.5 mmHg    RVSP:           37.1 mmHg MR Vmax:         568.50 cm/s MR Vmean:        428.0 cm/s   SHUNTS MR PISA:         1.79 cm     Systemic VTI:  0.28 m MR PISA Eff ROA: 10 mm       Systemic Diam: 2.30 cm MR PISA Radius:  0.53 cm MV E velocity: 85.73 cm/s MV A velocity: 44.37 cm/s MV E/A ratio:  1.93 Toribio Fuel MD Electronically signed by Toribio Fuel MD Signature Date/Time: 08/19/2023/10:18:33 PM    Final     Microbiology: Results for orders placed or performed during the hospital encounter of 09/11/23  Blood Culture (routine x 2)     Status: None (Preliminary result)   Collection Time: 09/11/23 11:21 AM   Specimen: BLOOD  Result Value Ref Range Status   Specimen Description BLOOD SITE NOT SPECIFIED  Final   Special Requests   Final    BOTTLES DRAWN AEROBIC AND ANAEROBIC  Blood Culture adequate volume   Culture   Final    NO GROWTH 4 DAYS Performed at [facility], [address] [place], [place] [address]    Report Status PENDING  Incomplete  Resp panel by RT-PCR (RSV, Flu A&B, Covid) Anterior Nasal Swab     Status: None   Collection Time: 09/11/23 11:22 AM   Specimen: Anterior Nasal Swab  Result Value Ref Range Status   SARS Coronavirus 2 by RT PCR NEGATIVE NEGATIVE Final   Influenza A by PCR NEGATIVE NEGATIVE Final   Influenza B by PCR NEGATIVE NEGATIVE Final    Comment: (NOTE) The Xpert Xpress SARS-CoV-2/FLU/RSV plus assay is intended as an aid in the diagnosis of influenza from Nasopharyngeal swab specimens and should not be used as a sole basis for treatment. Nasal washings and aspirates are unacceptable for Xpert Xpress SARS-CoV-2/FLU/RSV testing.  Fact Sheet  for Patients: BloggerCourse.com  Fact Sheet for Healthcare Providers: SeriousBroker.it  This test is not yet approved or cleared by the [place]  FDA and has been authorized for detection and/or diagnosis of SARS-CoV-2 by FDA under an Emergency Use Authorization (EUA). This EUA will remain in effect (meaning this test can be used) for the duration of the COVID-19 declaration under Section 564(b)(1) of the Act, 21 U.S.C. section 360bbb-3(b)(1), unless the authorization is terminated or revoked.     Resp Syncytial Virus by PCR NEGATIVE NEGATIVE Final    Comment: (NOTE) Fact Sheet for Patients: BloggerCourse.com  Fact Sheet for Healthcare Providers: SeriousBroker.it  This test is not yet approved or cleared by the [place]  FDA and has been authorized for detection and/or diagnosis of SARS-CoV-2 by FDA under an Emergency Use Authorization (EUA). This EUA will remain in effect (meaning this test can be used) for the duration of the COVID-19 declaration under Section 564(b)(1) of the Act, 21 U.S.C. section 360bbb-3(b)(1), unless the authorization is terminated or revoked.  Performed at [facility], [address] [place], [place] [address]   Blood Culture (routine x 2)     Status: Abnormal   Collection Time: 09/11/23  1:36 PM   Specimen: BLOOD RIGHT ARM  Result Value Ref Range Status   Specimen Description BLOOD RIGHT ARM  Final   Special Requests   Final    BOTTLES DRAWN AEROBIC AND ANAEROBIC Blood Culture adequate volume   Culture  Setup Time   Final    GRAM NEGATIVE RODS ANAEROBIC BOTTLE ONLY PHARMD JESSICA M. [id number], ADC    Culture (A)  Final    FUSOBACTERIUM NUCLEATUM BETA LACTAMASE POSITIVE Performed at [facility], [address], [place], [place] [address]    Report Status 09/15/2023 FINAL  Final  Blood Culture ID Panel (Reflexed)      Status: None   Collection Time: 09/11/23  1:36 PM  Result Value Ref Range Status   Enterococcus faecalis NOT DETECTED NOT DETECTED Final   Enterococcus Faecium NOT DETECTED NOT DETECTED Final   Listeria monocytogenes NOT DETECTED NOT DETECTED Final   Staphylococcus species NOT DETECTED NOT DETECTED Final   Staphylococcus aureus (BCID) NOT DETECTED NOT DETECTED Final   Staphylococcus epidermidis NOT DETECTED NOT DETECTED Final   Staphylococcus lugdunensis NOT DETECTED NOT DETECTED Final   Streptococcus species NOT DETECTED NOT DETECTED Final   Streptococcus agalactiae NOT DETECTED NOT DETECTED Final   Streptococcus pneumoniae NOT DETECTED NOT DETECTED Final   Streptococcus pyogenes NOT DETECTED NOT DETECTED Final   A.calcoaceticus-baumannii NOT DETECTED NOT DETECTED  Final   Bacteroides fragilis NOT DETECTED NOT DETECTED Final   Enterobacterales NOT DETECTED NOT DETECTED Final   Enterobacter cloacae complex NOT DETECTED NOT DETECTED Final   Escherichia coli NOT DETECTED NOT DETECTED Final   Klebsiella aerogenes NOT DETECTED NOT DETECTED Final   Klebsiella oxytoca NOT DETECTED NOT DETECTED Final   Klebsiella pneumoniae NOT DETECTED NOT DETECTED Final   Proteus species NOT DETECTED NOT DETECTED Final   Salmonella species NOT DETECTED NOT DETECTED Final   Serratia marcescens NOT DETECTED NOT DETECTED Final   Haemophilus influenzae NOT DETECTED NOT DETECTED Final   Neisseria meningitidis NOT DETECTED NOT DETECTED Final   Pseudomonas aeruginosa NOT DETECTED NOT DETECTED Final   Stenotrophomonas maltophilia NOT DETECTED NOT DETECTED Final   Candida albicans NOT DETECTED NOT DETECTED Final   Candida auris NOT DETECTED NOT DETECTED Final   Candida glabrata NOT DETECTED NOT DETECTED Final   Candida krusei NOT DETECTED NOT DETECTED Final   Candida parapsilosis NOT DETECTED NOT DETECTED Final   Candida tropicalis NOT DETECTED NOT DETECTED Final   Cryptococcus neoformans/gattii NOT DETECTED NOT  DETECTED Final    Comment: Performed at [facility], [address], [place], [place] [address]  Culture, blood (Routine X 2) w Reflex to ID Panel     Status: None (Preliminary result)   Collection Time: 09/13/23 12:01 AM   Specimen: BLOOD  Result Value Ref Range Status   Specimen Description BLOOD SITE NOT SPECIFIED  Final   Special Requests   Final    BOTTLES DRAWN AEROBIC ONLY Blood Culture results may not be optimal due to an inadequate volume of blood received in culture bottles   Culture   Final    NO GROWTH 2 DAYS Performed at [facility], [address], [place], [place] [address]    Report Status PENDING  Incomplete  Culture, blood (Routine X 2) w Reflex to ID Panel     Status: None (Preliminary result)   Collection Time: 09/15/23 12:23 AM   Specimen: BLOOD RIGHT HAND  Result Value Ref Range Status   Specimen Description BLOOD RIGHT HAND  Final   Special Requests   Final    BOTTLES DRAWN AEROBIC AND ANAEROBIC Blood Culture results may not be optimal due to an inadequate volume of blood received in culture bottles   Culture   Final    NO GROWTH < 12 HOURS Performed at [facility], [address] [place], [place] [address]    Report Status PENDING  Incomplete  Culture, blood (Routine X 2) w Reflex to ID Panel     Status: None (Preliminary result)   Collection Time: 09/15/23 12:24 AM   Specimen: BLOOD RIGHT HAND  Result Value Ref Range Status   Specimen Description BLOOD RIGHT HAND  Final   Special Requests   Final    BOTTLES DRAWN AEROBIC AND ANAEROBIC Blood Culture adequate volume   Culture   Final    NO GROWTH < 12 HOURS Performed at [facility], [address] [place], [place] [address]    Report Status PENDING  Incomplete    Labs: CBC: Recent Labs  Lab 09/11/23 1121 09/12/23 0542 09/13/23 0407 09/14/23 0441 09/15/23 0245  WBC 6.5 6.8 6.5 6.1 6.4  NEUTROABS 5.3 4.8 4.4 4.3 4.5  HGB 10.4* 10.9* 10.8* 10.5* 10.5*  HCT 29.7*  31.7* 31.3* 31.2* 30.7*  MCV 99.7 101.6* 101.0* 101.6* 100.3*  PLT 373 366 411* 423* 418*  Basic Metabolic Panel: Recent Labs  Lab 09/11/23 1146 09/11/23 1626 09/12/23 0542 09/12/23 0816 09/13/23 0407 09/14/23 0441 09/15/23 0245  NA 132*  --   --  131* 128* 131* 133*  K 2.9*  --   --  4.1 3.9 3.8 3.6  CL 98  --   --  103 97* 98 99  CO2 17*  --   --  19* 22 23 23   GLUCOSE 116*  --   --  115* 109* 101* 99  BUN 6*  --   --  <5* 5* <5* 6*  CREATININE 0.46  --   --  0.32* 0.40* 0.41* 0.33*  CALCIUM  9.1  --   --  9.1 9.1 8.8* 8.8*  MG  --  1.8 2.1  --  1.8 2.0 1.8  PHOS  --  3.2 2.5  --  3.3 3.8 3.7   Liver Function Tests: Recent Labs  Lab 09/11/23 1146 09/12/23 0816 09/13/23 0407 09/14/23 0441 09/15/23 0245  AST 33 28 26 23 20   ALT 37 31 29 26 24   ALKPHOS 73 87 89 92 88  BILITOT 0.9 0.8 0.8 0.7 0.6  PROT 6.3* 6.4* 6.2* 5.9* 5.7*  ALBUMIN 3.2* 3.4* 3.2* 3.0* 2.9*   CBG: Recent Labs  Lab 09/10/23 1952  GLUCAP 110*    Discharge time spent: greater than 30 minutes.  Signed: Nydia Distance, MD [facility] 09/15/2023

## 2023-09-15 NOTE — Plan of Care (Signed)

## 2023-09-16 LAB — CULTURE, BLOOD (ROUTINE X 2)
Culture: NO GROWTH
Special Requests: ADEQUATE

## 2023-09-18 DIAGNOSIS — B9689 Other specified bacterial agents as the cause of diseases classified elsewhere: Secondary | ICD-10-CM | POA: Diagnosis not present

## 2023-09-18 DIAGNOSIS — S42401D Unspecified fracture of lower end of right humerus, subsequent encounter for fracture with routine healing: Secondary | ICD-10-CM | POA: Diagnosis not present

## 2023-09-18 DIAGNOSIS — S42252D Displaced fracture of greater tuberosity of left humerus, subsequent encounter for fracture with routine healing: Secondary | ICD-10-CM | POA: Diagnosis not present

## 2023-09-18 DIAGNOSIS — F32A Depression, unspecified: Secondary | ICD-10-CM | POA: Diagnosis not present

## 2023-09-18 DIAGNOSIS — J320 Chronic maxillary sinusitis: Secondary | ICD-10-CM | POA: Diagnosis not present

## 2023-09-18 DIAGNOSIS — I119 Hypertensive heart disease without heart failure: Secondary | ICD-10-CM | POA: Diagnosis not present

## 2023-09-18 DIAGNOSIS — I480 Paroxysmal atrial fibrillation: Secondary | ICD-10-CM | POA: Diagnosis not present

## 2023-09-18 DIAGNOSIS — Z96612 Presence of left artificial shoulder joint: Secondary | ICD-10-CM | POA: Diagnosis not present

## 2023-09-18 DIAGNOSIS — R7881 Bacteremia: Secondary | ICD-10-CM | POA: Diagnosis not present

## 2023-09-18 LAB — CULTURE, BLOOD (ROUTINE X 2): Culture: NO GROWTH

## 2023-09-20 ENCOUNTER — Encounter

## 2023-09-20 DIAGNOSIS — Z96612 Presence of left artificial shoulder joint: Secondary | ICD-10-CM | POA: Diagnosis not present

## 2023-09-20 DIAGNOSIS — S42401D Unspecified fracture of lower end of right humerus, subsequent encounter for fracture with routine healing: Secondary | ICD-10-CM | POA: Diagnosis not present

## 2023-09-20 DIAGNOSIS — B9689 Other specified bacterial agents as the cause of diseases classified elsewhere: Secondary | ICD-10-CM | POA: Diagnosis not present

## 2023-09-20 DIAGNOSIS — F32A Depression, unspecified: Secondary | ICD-10-CM | POA: Diagnosis not present

## 2023-09-20 DIAGNOSIS — J320 Chronic maxillary sinusitis: Secondary | ICD-10-CM | POA: Diagnosis not present

## 2023-09-20 DIAGNOSIS — I119 Hypertensive heart disease without heart failure: Secondary | ICD-10-CM | POA: Diagnosis not present

## 2023-09-20 DIAGNOSIS — I480 Paroxysmal atrial fibrillation: Secondary | ICD-10-CM | POA: Diagnosis not present

## 2023-09-20 DIAGNOSIS — R7881 Bacteremia: Secondary | ICD-10-CM | POA: Diagnosis not present

## 2023-09-20 DIAGNOSIS — S42252D Displaced fracture of greater tuberosity of left humerus, subsequent encounter for fracture with routine healing: Secondary | ICD-10-CM | POA: Diagnosis not present

## 2023-09-20 LAB — CULTURE, BLOOD (ROUTINE X 2)
Culture: NO GROWTH
Culture: NO GROWTH
Special Requests: ADEQUATE

## 2023-09-21 DIAGNOSIS — S42252D Displaced fracture of greater tuberosity of left humerus, subsequent encounter for fracture with routine healing: Secondary | ICD-10-CM | POA: Diagnosis not present

## 2023-09-21 DIAGNOSIS — M25521 Pain in right elbow: Secondary | ICD-10-CM | POA: Diagnosis not present

## 2023-09-22 ENCOUNTER — Ambulatory Visit: Admitting: Physical Therapy

## 2023-09-23 DIAGNOSIS — R7881 Bacteremia: Secondary | ICD-10-CM | POA: Diagnosis not present

## 2023-09-23 DIAGNOSIS — I119 Hypertensive heart disease without heart failure: Secondary | ICD-10-CM | POA: Diagnosis not present

## 2023-09-23 DIAGNOSIS — S42252D Displaced fracture of greater tuberosity of left humerus, subsequent encounter for fracture with routine healing: Secondary | ICD-10-CM | POA: Diagnosis not present

## 2023-09-23 DIAGNOSIS — I480 Paroxysmal atrial fibrillation: Secondary | ICD-10-CM | POA: Diagnosis not present

## 2023-09-23 DIAGNOSIS — F32A Depression, unspecified: Secondary | ICD-10-CM | POA: Diagnosis not present

## 2023-09-23 DIAGNOSIS — S42401D Unspecified fracture of lower end of right humerus, subsequent encounter for fracture with routine healing: Secondary | ICD-10-CM | POA: Diagnosis not present

## 2023-09-23 DIAGNOSIS — J320 Chronic maxillary sinusitis: Secondary | ICD-10-CM | POA: Diagnosis not present

## 2023-09-23 DIAGNOSIS — Z96612 Presence of left artificial shoulder joint: Secondary | ICD-10-CM | POA: Diagnosis not present

## 2023-09-23 DIAGNOSIS — B9689 Other specified bacterial agents as the cause of diseases classified elsewhere: Secondary | ICD-10-CM | POA: Diagnosis not present

## 2023-09-24 DIAGNOSIS — J42 Unspecified chronic bronchitis: Secondary | ICD-10-CM | POA: Diagnosis not present

## 2023-09-24 DIAGNOSIS — I712 Thoracic aortic aneurysm, without rupture, unspecified: Secondary | ICD-10-CM | POA: Diagnosis not present

## 2023-09-24 DIAGNOSIS — M25519 Pain in unspecified shoulder: Secondary | ICD-10-CM | POA: Diagnosis not present

## 2023-09-24 DIAGNOSIS — Z95818 Presence of other cardiac implants and grafts: Secondary | ICD-10-CM | POA: Diagnosis not present

## 2023-09-24 DIAGNOSIS — A498 Other bacterial infections of unspecified site: Secondary | ICD-10-CM | POA: Diagnosis not present

## 2023-09-24 DIAGNOSIS — I4891 Unspecified atrial fibrillation: Secondary | ICD-10-CM | POA: Diagnosis not present

## 2023-09-27 ENCOUNTER — Encounter

## 2023-09-29 ENCOUNTER — Ambulatory Visit: Admitting: Physical Therapy

## 2023-09-29 ENCOUNTER — Other Ambulatory Visit: Payer: Medicare HMO

## 2023-09-29 DIAGNOSIS — S42401D Unspecified fracture of lower end of right humerus, subsequent encounter for fracture with routine healing: Secondary | ICD-10-CM | POA: Diagnosis not present

## 2023-09-29 DIAGNOSIS — R7881 Bacteremia: Secondary | ICD-10-CM | POA: Diagnosis not present

## 2023-09-29 DIAGNOSIS — F32A Depression, unspecified: Secondary | ICD-10-CM | POA: Diagnosis not present

## 2023-09-29 DIAGNOSIS — I119 Hypertensive heart disease without heart failure: Secondary | ICD-10-CM | POA: Diagnosis not present

## 2023-09-29 DIAGNOSIS — J320 Chronic maxillary sinusitis: Secondary | ICD-10-CM | POA: Diagnosis not present

## 2023-09-29 DIAGNOSIS — S42252D Displaced fracture of greater tuberosity of left humerus, subsequent encounter for fracture with routine healing: Secondary | ICD-10-CM | POA: Diagnosis not present

## 2023-09-29 DIAGNOSIS — Z96612 Presence of left artificial shoulder joint: Secondary | ICD-10-CM | POA: Diagnosis not present

## 2023-09-29 DIAGNOSIS — B9689 Other specified bacterial agents as the cause of diseases classified elsewhere: Secondary | ICD-10-CM | POA: Diagnosis not present

## 2023-09-29 DIAGNOSIS — I480 Paroxysmal atrial fibrillation: Secondary | ICD-10-CM | POA: Diagnosis not present

## 2023-10-01 DIAGNOSIS — S42401D Unspecified fracture of lower end of right humerus, subsequent encounter for fracture with routine healing: Secondary | ICD-10-CM | POA: Diagnosis not present

## 2023-10-01 DIAGNOSIS — S42252D Displaced fracture of greater tuberosity of left humerus, subsequent encounter for fracture with routine healing: Secondary | ICD-10-CM | POA: Diagnosis not present

## 2023-10-01 DIAGNOSIS — J320 Chronic maxillary sinusitis: Secondary | ICD-10-CM | POA: Diagnosis not present

## 2023-10-01 DIAGNOSIS — I480 Paroxysmal atrial fibrillation: Secondary | ICD-10-CM | POA: Diagnosis not present

## 2023-10-01 DIAGNOSIS — F32A Depression, unspecified: Secondary | ICD-10-CM | POA: Diagnosis not present

## 2023-10-01 DIAGNOSIS — R7881 Bacteremia: Secondary | ICD-10-CM | POA: Diagnosis not present

## 2023-10-01 DIAGNOSIS — I119 Hypertensive heart disease without heart failure: Secondary | ICD-10-CM | POA: Diagnosis not present

## 2023-10-01 DIAGNOSIS — B9689 Other specified bacterial agents as the cause of diseases classified elsewhere: Secondary | ICD-10-CM | POA: Diagnosis not present

## 2023-10-01 DIAGNOSIS — Z96612 Presence of left artificial shoulder joint: Secondary | ICD-10-CM | POA: Diagnosis not present

## 2023-10-04 DIAGNOSIS — S42401D Unspecified fracture of lower end of right humerus, subsequent encounter for fracture with routine healing: Secondary | ICD-10-CM | POA: Diagnosis not present

## 2023-10-04 DIAGNOSIS — R7881 Bacteremia: Secondary | ICD-10-CM | POA: Diagnosis not present

## 2023-10-04 DIAGNOSIS — S42252D Displaced fracture of greater tuberosity of left humerus, subsequent encounter for fracture with routine healing: Secondary | ICD-10-CM | POA: Diagnosis not present

## 2023-10-04 DIAGNOSIS — I480 Paroxysmal atrial fibrillation: Secondary | ICD-10-CM | POA: Diagnosis not present

## 2023-10-04 DIAGNOSIS — J320 Chronic maxillary sinusitis: Secondary | ICD-10-CM | POA: Diagnosis not present

## 2023-10-04 DIAGNOSIS — F32A Depression, unspecified: Secondary | ICD-10-CM | POA: Diagnosis not present

## 2023-10-04 DIAGNOSIS — Z96612 Presence of left artificial shoulder joint: Secondary | ICD-10-CM | POA: Diagnosis not present

## 2023-10-04 DIAGNOSIS — I119 Hypertensive heart disease without heart failure: Secondary | ICD-10-CM | POA: Diagnosis not present

## 2023-10-04 DIAGNOSIS — B9689 Other specified bacterial agents as the cause of diseases classified elsewhere: Secondary | ICD-10-CM | POA: Diagnosis not present

## 2023-10-05 ENCOUNTER — Encounter: Admitting: Physical Therapy

## 2023-10-05 DIAGNOSIS — S42252D Displaced fracture of greater tuberosity of left humerus, subsequent encounter for fracture with routine healing: Secondary | ICD-10-CM | POA: Diagnosis not present

## 2023-10-05 DIAGNOSIS — M25561 Pain in right knee: Secondary | ICD-10-CM | POA: Diagnosis not present

## 2023-10-06 DIAGNOSIS — R7881 Bacteremia: Secondary | ICD-10-CM | POA: Diagnosis not present

## 2023-10-06 DIAGNOSIS — B9689 Other specified bacterial agents as the cause of diseases classified elsewhere: Secondary | ICD-10-CM | POA: Diagnosis not present

## 2023-10-06 DIAGNOSIS — F32A Depression, unspecified: Secondary | ICD-10-CM | POA: Diagnosis not present

## 2023-10-06 DIAGNOSIS — J320 Chronic maxillary sinusitis: Secondary | ICD-10-CM | POA: Diagnosis not present

## 2023-10-06 DIAGNOSIS — Z96612 Presence of left artificial shoulder joint: Secondary | ICD-10-CM | POA: Diagnosis not present

## 2023-10-06 DIAGNOSIS — S42252D Displaced fracture of greater tuberosity of left humerus, subsequent encounter for fracture with routine healing: Secondary | ICD-10-CM | POA: Diagnosis not present

## 2023-10-06 DIAGNOSIS — I119 Hypertensive heart disease without heart failure: Secondary | ICD-10-CM | POA: Diagnosis not present

## 2023-10-06 DIAGNOSIS — I480 Paroxysmal atrial fibrillation: Secondary | ICD-10-CM | POA: Diagnosis not present

## 2023-10-06 DIAGNOSIS — S42401D Unspecified fracture of lower end of right humerus, subsequent encounter for fracture with routine healing: Secondary | ICD-10-CM | POA: Diagnosis not present

## 2023-10-12 DIAGNOSIS — S42401D Unspecified fracture of lower end of right humerus, subsequent encounter for fracture with routine healing: Secondary | ICD-10-CM | POA: Diagnosis not present

## 2023-10-12 DIAGNOSIS — Z96612 Presence of left artificial shoulder joint: Secondary | ICD-10-CM | POA: Diagnosis not present

## 2023-10-12 DIAGNOSIS — I480 Paroxysmal atrial fibrillation: Secondary | ICD-10-CM | POA: Diagnosis not present

## 2023-10-12 DIAGNOSIS — F32A Depression, unspecified: Secondary | ICD-10-CM | POA: Diagnosis not present

## 2023-10-12 DIAGNOSIS — R7881 Bacteremia: Secondary | ICD-10-CM | POA: Diagnosis not present

## 2023-10-12 DIAGNOSIS — S42252D Displaced fracture of greater tuberosity of left humerus, subsequent encounter for fracture with routine healing: Secondary | ICD-10-CM | POA: Diagnosis not present

## 2023-10-12 DIAGNOSIS — B9689 Other specified bacterial agents as the cause of diseases classified elsewhere: Secondary | ICD-10-CM | POA: Diagnosis not present

## 2023-10-12 DIAGNOSIS — I119 Hypertensive heart disease without heart failure: Secondary | ICD-10-CM | POA: Diagnosis not present

## 2023-10-12 DIAGNOSIS — J320 Chronic maxillary sinusitis: Secondary | ICD-10-CM | POA: Diagnosis not present

## 2023-10-18 ENCOUNTER — Encounter (HOSPITAL_COMMUNITY): Payer: Self-pay

## 2023-10-19 DIAGNOSIS — I10 Essential (primary) hypertension: Secondary | ICD-10-CM | POA: Diagnosis not present

## 2023-10-19 DIAGNOSIS — B9689 Other specified bacterial agents as the cause of diseases classified elsewhere: Secondary | ICD-10-CM | POA: Diagnosis not present

## 2023-10-19 DIAGNOSIS — M8580 Other specified disorders of bone density and structure, unspecified site: Secondary | ICD-10-CM | POA: Diagnosis not present

## 2023-10-19 DIAGNOSIS — J320 Chronic maxillary sinusitis: Secondary | ICD-10-CM | POA: Diagnosis not present

## 2023-10-19 DIAGNOSIS — I119 Hypertensive heart disease without heart failure: Secondary | ICD-10-CM | POA: Diagnosis not present

## 2023-10-19 DIAGNOSIS — I480 Paroxysmal atrial fibrillation: Secondary | ICD-10-CM | POA: Diagnosis not present

## 2023-10-19 DIAGNOSIS — F32A Depression, unspecified: Secondary | ICD-10-CM | POA: Diagnosis not present

## 2023-10-19 DIAGNOSIS — S42252D Displaced fracture of greater tuberosity of left humerus, subsequent encounter for fracture with routine healing: Secondary | ICD-10-CM | POA: Diagnosis not present

## 2023-10-19 DIAGNOSIS — R7881 Bacteremia: Secondary | ICD-10-CM | POA: Diagnosis not present

## 2023-10-19 DIAGNOSIS — D649 Anemia, unspecified: Secondary | ICD-10-CM | POA: Diagnosis not present

## 2023-10-19 DIAGNOSIS — S42401D Unspecified fracture of lower end of right humerus, subsequent encounter for fracture with routine healing: Secondary | ICD-10-CM | POA: Diagnosis not present

## 2023-10-19 DIAGNOSIS — Z96612 Presence of left artificial shoulder joint: Secondary | ICD-10-CM | POA: Diagnosis not present

## 2023-10-20 ENCOUNTER — Other Ambulatory Visit: Payer: Self-pay | Admitting: Cardiology

## 2023-10-20 ENCOUNTER — Ambulatory Visit (HOSPITAL_COMMUNITY)
Admission: RE | Admit: 2023-10-20 | Discharge: 2023-10-20 | Disposition: A | Discharge status: Home / Self Care | Source: Ambulatory Visit | Attending: Cardiology | Admitting: Cardiology

## 2023-10-20 DIAGNOSIS — I351 Nonrheumatic aortic (valve) insufficiency: Secondary | ICD-10-CM | POA: Diagnosis not present

## 2023-10-20 DIAGNOSIS — Z8679 Personal history of other diseases of the circulatory system: Secondary | ICD-10-CM | POA: Diagnosis not present

## 2023-10-20 DIAGNOSIS — I77819 Aortic ectasia, unspecified site: Secondary | ICD-10-CM | POA: Insufficient documentation

## 2023-10-20 DIAGNOSIS — I4819 Other persistent atrial fibrillation: Secondary | ICD-10-CM | POA: Diagnosis not present

## 2023-10-20 DIAGNOSIS — I10 Essential (primary) hypertension: Secondary | ICD-10-CM | POA: Diagnosis not present

## 2023-10-22 NOTE — Therapy (Signed)
 OUTPATIENT PHYSICAL THERAPY UPPER EXTREMITY EVALUATION   Patient Name: Michelle Johnston MRN: [id number] DOB:06/22/1950, 73 y.o., female Today's Date: 10/25/2023  END OF SESSION:  PT End of Session - 10/25/23 1511     Visit Number 1    Date for PT Re-Evaluation 12/20/23    Authorization Type [employer] M[employer] (auth requested)    Progress Note Due on Visit 10    PT Start Time 1404    PT Stop Time 1449    PT Time Calculation (min) 45 min    Activity Tolerance Patient tolerated treatment well    Behavior During Therapy [facility] for tasks assessed/performed          Past Medical History:  Diagnosis Date   Anxiety    Arthritis    Atrial fibrillation (HCC)    Bradycardia    Bronchitis    Depression    Dyspnea    GERD (gastroesophageal reflux disease)    Glaucoma    Hypertension    Presence of Watchman left atrial appendage closure device 08/07/2021   Watchman FLX 27mm with Dr. Cindie   Seasonal allergies    Sleep apnea    does not use CPAP   Wears glasses    Past Surgical History:  Procedure Laterality Date   ABDOMINAL HYSTERECTOMY  1990   ATRIAL FIBRILLATION ABLATION N/A 08/12/2020   Procedure: ATRIAL FIBRILLATION ABLATION;  Surgeon: Cindie Ole DASEN, MD;  Location: [facility];  Service: Cardiovascular;  Laterality: N/A;   ATRIAL FIBRILLATION ABLATION N/A 03/26/2023   Procedure: ATRIAL FIBRILLATION ABLATION;  Surgeon: Cindie Ole DASEN, MD;  Location: [facility];  Service: Cardiovascular;  Laterality: N/A;   BREAST BIOPSY Right    benign   CARDIOVERSION N/A 08/15/2019   Procedure: CARDIOVERSION;  Surgeon: Elmira Newman PARAS, MD;  Location: [facility];  Service: Cardiovascular;  Laterality: N/A;   COLONOSCOPY     LEFT ATRIAL APPENDAGE OCCLUSION N/A 08/07/2021   Procedure: LEFT ATRIAL APPENDAGE OCCLUSION;  Surgeon: Cindie Ole DASEN, MD;  Location: [facility];  Service: Cardiovascular;  Laterality: N/A;   meniscus tear knee     left knee   NASAL SINUS  SURGERY  1975   ORIF ANKLE FRACTURE  2009   left   ORIF HUMERUS FRACTURE Right 07/15/2023   Procedure: OPEN REDUCTION INTERNAL FIXATION (ORIF) DISTAL HUMERUS FRACTURE;  Surgeon: Cristy Bonner DASEN, MD;  Location: [facility];  Service: Orthopedics;  Laterality: Right;   REVERSE SHOULDER ARTHROPLASTY Left 09/02/2023   Procedure: ARTHROPLASTY, SHOULDER, TOTAL, REVERSE;  Surgeon: Cristy Bonner DASEN, MD;  Location: [facility];  Service: Orthopedics;  Laterality: Left;   TEE WITHOUT CARDIOVERSION N/A 08/07/2021   Procedure: TRANSESOPHAGEAL ECHOCARDIOGRAM (TEE);  Surgeon: Cindie Ole DASEN, MD;  Location: [facility];  Service: Cardiovascular;  Laterality: N/A;   TOTAL KNEE ARTHROPLASTY Left 02/03/2019   Procedure: LEFT TOTAL KNEE ARTHROPLASTY;  Surgeon: Vernetta Lonni GRADE, MD;  Location: [facility];  Service: Orthopedics;  Laterality: Left;   TURBINATE REDUCTION Bilateral 06/04/2014   Procedure: BILATERAL TURBINATE REDUCTION;  Surgeon: Daniel Moccasin, MD;  Location: [facility];  Service: ENT;  Laterality: Bilateral;   TYMPANOSTOMY TUBE PLACEMENT     left   Patient Active Problem List   Diagnosis Date Noted   Near syncope 09/11/2023   Impacted cerumen of left ear 08/15/2023   Other specified disorders of eustachian tube, left ear 02/06/2023   Central perforation of tympanic membrane of left ear 02/06/2023  Presence of Watchman left atrial appendage closure device 08/07/2021   Atrial fibrillation (HCC) 08/07/2021   OSA (obstructive sleep apnea) 10/29/2020   Persistent atrial fibrillation (HCC) 03/27/2020   Hypercoagulable state due to persistent atrial fibrillation (HCC) 03/27/2020   PAD (peripheral artery disease) (HCC) 12/27/2019   Exertional chest pain 12/27/2019   Primary osteoarthritis of right knee 07/12/2019   Paroxysmal atrial fibrillation (HCC) 06/16/2019   Nonrheumatic aortic valve insufficiency 06/16/2019   Nonrheumatic mitral valve regurgitation 06/16/2019   Tachycardia  02/20/2019   Abnormal EKG 02/20/2019   Essential hypertension 02/20/2019   Status post total left knee replacement 02/03/2019   Unilateral primary osteoarthritis, left knee 11/30/2016   Chronic pain of left knee 11/30/2016    PCP:   Loreli Kins, MD    REFERRING PROVIDER: Cristy Bonner DASEN, MD  REFERRING DIAG: left total shoulder arthoplasty ORIF tuberosities  THERAPY DIAG:  Chronic left shoulder pain  Stiffness of left shoulder, not elsewhere classified  Muscle weakness (generalized)  Abnormal posture  Unsteadiness on feet  Rationale for Evaluation and Treatment: Rehabilitation  ONSET DATE: Surgery 09/30/2023  SUBJECTIVE:                                                                                                                                                                                      SUBJECTIVE STATEMENT: Patient presents status post a reverse left shoulder replacement on 7/31. She was in the hospital for 5 days after the surgery due to an electrolyte imbalance. She had a few weeks on in home therapy. She reports she did more stretching, wrist ROM, and she started the pulley last week. She was a previous patient at the clinic due to a right elbow injury. Challenging activities: folding clothes, washing dishes, sweeping Hand dominance: Right  PERTINENT HISTORY: Anxiety; OA; Depression; HTN; Hx left TKA  PAIN:  Are you having pain? Yes: NPRS scale: 0(currently) 3(worst)/ 10  Pain location: left shoulder Pain description: dull;achy Aggravating factors: folding clothes Relieving factors: Ice; Tylenol    PRECAUTIONS: Shoulder and Fall see protocol in media  RED FLAGS:  None   WEIGHT BEARING RESTRICTIONS: No  FALLS:  Has patient fallen in last 6 months? Yes. Number of falls 2 fall. Patient had two surgeries two weeks from each other due to falling. She fell while she was at the grocery store. She was turning and her husband said her feet did not move  with her. PT to address balance   LIVING ENVIRONMENT: Lives with: lives with their spouse Lives in: House/apartment Stairs: Yes: Internal: 12 steps; on left going up Has following equipment at home: Single point cane and Walker - 2 wheeled  OCCUPATION: Retired  PLOF: Independent, Independent with basic ADLs, Independent with household mobility without device, Independent with community mobility without device, Independent with gait, and Independent with transfers  PATIENT GOALS: use of my arm again. To get back to water  aerobics and drive  NEXT MD VISIT: September 30th   OBJECTIVE:  Note: Objective measures were completed at Evaluation unless otherwise noted.   PATIENT SURVEYS :  QuickDASH : 52.3/100 52.3%  COGNITION: Overall cognitive status: Within functional limits for tasks assessed     SENSATION: WFL  POSTURE: Rounded shoulder & forward head Standing posture/ gait: right pelvic shift and left trunk lean  UPPER EXTREMITY ROM:   Passive ROM Right eval Left eval  Shoulder flexion  90  Shoulder extension  NT due to restrictions  Shoulder abduction  75  Shoulder adduction    Shoulder internal rotation  NT due to restrictions  Shoulder external rotation  NT due to restrictions  Elbow flexion    Elbow extension    Wrist flexion    Wrist extension    Wrist ulnar deviation    Wrist radial deviation    Wrist pronation    Wrist supination    (Blank rows = not tested)  UPPER EXTREMITY MMT: NT due to weight restrictions of 1lb   FUNCTIONAL TESTING 5STS: 17.47 sec no UE support TUG:14.32 sec  JOINT MOBILITY TESTING:  See PROM measurement above Open end feel                                                                                                                              TREATMENT DATE:  10/25/2023 Initial Evaluation & HEP created Patient education on safe icing technique; review of restrictions  PATIENT EDUCATION: Education details: PT eval  findings, anticipated POC, progress with PT, and initial HEP Person educated: Patient Education method: Explanation, Demonstration, and Handouts Education comprehension: verbalized understanding, returned demonstration, and needs further education  HOME EXERCISE PROGRAM: Access Code: [email or web address] URL: [email or web address] Date: 10/25/2023 Prepared by: Kristeen Sar  Exercises - Supine Shoulder Flexion with Dowel  - 1 x daily - 7 x weekly - 1-2 sets - 10 reps - Seated Scapular Retraction  - 1 x daily - 7 x weekly - 1 sets - 10 reps - Isometric Shoulder Flexion  - 1 x daily - 7 x weekly - 2 sets - 5 reps - 5 hold - Standing Isometric Shoulder Abduction with Doorway - Arm Bent  - 1 x daily - 7 x weekly - 2 sets - 5 reps - 5 hold - Seated Shoulder Flexion AAROM with Pulley Behind  - 1 x daily - 7 x weekly - 2 sets - 10 reps - Seated Shoulder Scaption AAROM with Pulley at Side  - 1 x daily - 7 x weekly - 2 sets - 10 reps  ASSESSMENT:  CLINICAL IMPRESSION: Patient is a 73 y.o. female who was seen today for physical therapy evaluation and treatment for left  shoulder pain after a reverse total shoulder. Shiri presents to therapy status post left reverse shoulder replacement on 7/31. Patient had to stay in the hospital 5 days after surgery due to an electrolye imbalance. Patient reporting having two falls within the last 6 months and both falls resulted in her having surgery two week from another. PT will address balance. Reviewed shoulder precautions based on protocol and educated patient on safe icing techniques. Based on evaluation noted decreased shoulder ROM, decreased strength, poor gait mechanics, and standing posture. Currently she is unable to fold clothes, sweep, and curl her hair due to limited ROM. Patient is motivated and wants to return to water  aerobics. Patient will benefit from skilled PT to address the below impairments and improve overall function.     OBJECTIVE  IMPAIRMENTS: Abnormal gait, decreased balance, decreased ROM, decreased strength, hypomobility, increased muscle spasms, impaired flexibility, impaired UE functional use, postural dysfunction, and pain.   ACTIVITY LIMITATIONS: carrying, lifting, bending, squatting, stairs, bathing, dressing, reach over head, and hygiene/grooming  PARTICIPATION LIMITATIONS: meal prep, cleaning, laundry, interpersonal relationship, driving, shopping, and community activity  PERSONAL FACTORS: Age and 3+ comorbidities: falls risk HTN; anxiety; depression are also affecting patient's functional outcome.   REHAB POTENTIAL: Good  CLINICAL DECISION MAKING: Evolving/moderate complexity  EVALUATION COMPLEXITY: Moderate  GOALS: Goals reviewed with patient? Yes  SHORT TERM GOALS: Target date: 11/22/2023   Patient will be independent with initial HEP. Baseline:  Goal status: INITIAL  2.  Patient will report > or = to 40% use of Lt UE with light home and self care tasks due to improved ROM and strength. Baseline:  Goal status: INITIAL  3.  Patient will demonstrate correct seated and standing posture to reduce mechanical strain. Baseline:  Goal status: INITIAL  4.  .  Patient will demonstrate > or = to 100 degrees in Lt  shoulder P/ROM for improved reaching. Baseline:  Goal status: INITIAL   LONG TERM GOALS: Target date: 12/20/2023  Patient will demonstrate independence in advanced HEP. Baseline:  Goal status: INITIAL  2.  .  Patient will report > or = to 70% of normal use of Rt UE for ADLs due to improved functional strength and A/ROM. Baseline:  Goal status: INITIAL  3.  Patient will be able to return to return water  aerobics program with no difficulty to establish normal exercise routine Baseline:  Goal status: INITIAL   4.  Patient will be able to wash dishes and fold clothes with < or = to 2/10 left shoulder pain to for improved functional use of extremity. Baseline:  Goal status:  INITIAL  5.  Patient will demonstrate > or = to 115 degrees in Rt shoulder A/ROM for improved reaching overhead. Baseline:  Goal status: INITIAL  6.  Patient's Annitta will be 64/100 for improved functional use of Lt UE and decreased disability.  Baseline: 52.3/100 Goal status: INITIAL  PLAN: PT FREQUENCY: 1-2x/week  PT DURATION: 8 weeks  PLANNED INTERVENTIONS: 97164- PT Re-evaluation, 97110-Therapeutic exercises, 97530- Therapeutic activity, 97112- Neuromuscular re-education, 97535- Self Care, [id number]- Manual therapy, [phone]- Gait training, [id number]- Canalith repositioning, [id number]- Aquatic Therapy, [phone]- Electrical stimulation (unattended), [phone]- Electrical stimulation (manual), [id number]- Vasopneumatic device, [id number]- Ultrasound, [id number]- Ionotophoresis 4mg /ml Dexamethasone , 79439 (1-2 muscles), [id number] (3+ muscles)- Dry Needling, Patient/Family education, Balance training, Stair training, Taping, Joint mobilization, Joint manipulation, Spinal manipulation, Spinal mobilization, Scar mobilization, Vestibular training, Cryotherapy, and Moist heat  PLAN FOR NEXT SESSION: Review HEP; pulley; finger ladder; follow protocol (in media); assess balance  Kristeen Sar, PT 10/25/23 3:12 PM [facility] [address] [place], [place] [address] Phone # [phone] Fax [phone]

## 2023-10-24 ENCOUNTER — Ambulatory Visit: Payer: Self-pay | Admitting: Cardiology

## 2023-10-25 ENCOUNTER — Encounter: Payer: Self-pay | Admitting: Physical Therapy

## 2023-10-25 ENCOUNTER — Ambulatory Visit: Attending: Orthopaedic Surgery | Admitting: Physical Therapy

## 2023-10-25 ENCOUNTER — Other Ambulatory Visit: Payer: Self-pay

## 2023-10-25 DIAGNOSIS — R293 Abnormal posture: Secondary | ICD-10-CM | POA: Diagnosis not present

## 2023-10-25 DIAGNOSIS — R2681 Unsteadiness on feet: Secondary | ICD-10-CM | POA: Diagnosis not present

## 2023-10-25 DIAGNOSIS — M25512 Pain in left shoulder: Secondary | ICD-10-CM | POA: Insufficient documentation

## 2023-10-25 DIAGNOSIS — M25612 Stiffness of left shoulder, not elsewhere classified: Secondary | ICD-10-CM | POA: Insufficient documentation

## 2023-10-25 DIAGNOSIS — G8929 Other chronic pain: Secondary | ICD-10-CM | POA: Insufficient documentation

## 2023-10-25 DIAGNOSIS — M6281 Muscle weakness (generalized): Secondary | ICD-10-CM | POA: Insufficient documentation

## 2023-10-27 ENCOUNTER — Encounter: Payer: Self-pay | Admitting: Physical Therapy

## 2023-10-27 ENCOUNTER — Ambulatory Visit: Admitting: Physical Therapy

## 2023-10-27 DIAGNOSIS — R293 Abnormal posture: Secondary | ICD-10-CM | POA: Diagnosis not present

## 2023-10-27 DIAGNOSIS — M25612 Stiffness of left shoulder, not elsewhere classified: Secondary | ICD-10-CM

## 2023-10-27 DIAGNOSIS — M6281 Muscle weakness (generalized): Secondary | ICD-10-CM

## 2023-10-27 DIAGNOSIS — G8929 Other chronic pain: Secondary | ICD-10-CM

## 2023-10-27 DIAGNOSIS — M25512 Pain in left shoulder: Secondary | ICD-10-CM | POA: Diagnosis not present

## 2023-10-27 DIAGNOSIS — R2681 Unsteadiness on feet: Secondary | ICD-10-CM | POA: Diagnosis not present

## 2023-10-27 NOTE — Therapy (Signed)
 OUTPATIENT PHYSICAL THERAPY UPPER EXTREMITY TREATMENT NOTE   Patient Name: Michelle Johnston MRN: [id number] DOB:Nov 28, 1950, 73 y.o., female Today's Date: 10/27/2023  END OF SESSION:  PT End of Session - 10/27/23 1023     Visit Number 2    Number of Visits 16    Date for PT Re-Evaluation 12/20/23    Authorization Type 16 visits 10/25/23 to 12/20/23    Authorization - Visit Number 2    Authorization - Number of Visits 16    Progress Note Due on Visit 10    PT Start Time 1019    PT Stop Time 1106    PT Time Calculation (min) 47 min    Activity Tolerance Patient tolerated treatment well    Behavior During Therapy [facility] for tasks assessed/performed           Past Medical History:  Diagnosis Date   Anxiety    Arthritis    Atrial fibrillation (HCC)    Bradycardia    Bronchitis    Depression    Dyspnea    GERD (gastroesophageal reflux disease)    Glaucoma    Hypertension    Presence of Watchman left atrial appendage closure device 08/07/2021   Watchman FLX 27mm with Dr. Cindie   Seasonal allergies    Sleep apnea    does not use CPAP   Wears glasses    Past Surgical History:  Procedure Laterality Date   ABDOMINAL HYSTERECTOMY  1990   ATRIAL FIBRILLATION ABLATION N/A 08/12/2020   Procedure: ATRIAL FIBRILLATION ABLATION;  Surgeon: Cindie Ole DASEN, MD;  Location: [facility];  Service: Cardiovascular;  Laterality: N/A;   ATRIAL FIBRILLATION ABLATION N/A 03/26/2023   Procedure: ATRIAL FIBRILLATION ABLATION;  Surgeon: Cindie Ole DASEN, MD;  Location: [facility];  Service: Cardiovascular;  Laterality: N/A;   BREAST BIOPSY Right    benign   CARDIOVERSION N/A 08/15/2019   Procedure: CARDIOVERSION;  Surgeon: Elmira Newman PARAS, MD;  Location: [facility];  Service: Cardiovascular;  Laterality: N/A;   COLONOSCOPY     LEFT ATRIAL APPENDAGE OCCLUSION N/A 08/07/2021   Procedure: LEFT ATRIAL APPENDAGE OCCLUSION;  Surgeon: Cindie Ole DASEN, MD;  Location: [facility];  Service: Cardiovascular;  Laterality: N/A;   meniscus tear knee     left knee   NASAL SINUS SURGERY  1975   ORIF ANKLE FRACTURE  2009   left   ORIF HUMERUS FRACTURE Right 07/15/2023   Procedure: OPEN REDUCTION INTERNAL FIXATION (ORIF) DISTAL HUMERUS FRACTURE;  Surgeon: Cristy Bonner DASEN, MD;  Location: [facility];  Service: Orthopedics;  Laterality: Right;   REVERSE SHOULDER ARTHROPLASTY Left 09/02/2023   Procedure: ARTHROPLASTY, SHOULDER, TOTAL, REVERSE;  Surgeon: Cristy Bonner DASEN, MD;  Location: [facility];  Service: Orthopedics;  Laterality: Left;   TEE WITHOUT CARDIOVERSION N/A 08/07/2021   Procedure: TRANSESOPHAGEAL ECHOCARDIOGRAM (TEE);  Surgeon: Cindie Ole DASEN, MD;  Location: [facility] CV [facility];  Service: Cardiovascular;  Laterality: N/A;   TOTAL KNEE ARTHROPLASTY Left 02/03/2019   Procedure: LEFT TOTAL KNEE ARTHROPLASTY;  Surgeon: Vernetta Lonni GRADE, MD;  Location: [facility];  Service: Orthopedics;  Laterality: Left;   TURBINATE REDUCTION Bilateral 06/04/2014   Procedure: BILATERAL TURBINATE REDUCTION;  Surgeon: Daniel Moccasin, MD;  Location: [facility];  Service: ENT;  Laterality: Bilateral;   TYMPANOSTOMY TUBE PLACEMENT     left   Patient Active Problem List   Diagnosis Date Noted   Near syncope 09/11/2023   Impacted cerumen  of left ear 08/15/2023   Other specified disorders of eustachian tube, left ear 02/06/2023   Central perforation of tympanic membrane of left ear 02/06/2023   Presence of Watchman left atrial appendage closure device 08/07/2021   Atrial fibrillation (HCC) 08/07/2021   OSA (obstructive sleep apnea) 10/29/2020   Persistent atrial fibrillation (HCC) 03/27/2020   Hypercoagulable state due to persistent atrial fibrillation (HCC) 03/27/2020   PAD (peripheral artery disease) (HCC) 12/27/2019   Exertional chest pain 12/27/2019   Primary osteoarthritis of right knee 07/12/2019   Paroxysmal atrial fibrillation (HCC) 06/16/2019   Nonrheumatic  aortic valve insufficiency 06/16/2019   Nonrheumatic mitral valve regurgitation 06/16/2019   Tachycardia 02/20/2019   Abnormal EKG 02/20/2019   Essential hypertension 02/20/2019   Status post total left knee replacement 02/03/2019   Unilateral primary osteoarthritis, left knee 11/30/2016   Chronic pain of left knee 11/30/2016    PCP:   Loreli Kins, MD    REFERRING PROVIDER: Cristy Bonner DASEN, MD  REFERRING DIAG: left total shoulder arthoplasty ORIF tuberosities  THERAPY DIAG:  Chronic left shoulder pain  Stiffness of left shoulder, not elsewhere classified  Muscle weakness (generalized)  Abnormal posture  Rationale for Evaluation and Treatment: Rehabilitation  ONSET DATE: Surgery 09/30/2023  SUBJECTIVE:                                                                                                                                                                                      SUBJECTIVE STATEMENT: I must have slept a little funny on the shoulder b/c it hurts a little this morning but usually doesn't.  Not bad though, maybe a 2-3/10 Hand dominance: Right  PERTINENT HISTORY: Anxiety; OA; Depression; HTN; Hx left TKA  PAIN:  Are you having pain? Yes: NPRS scale: 2-3(currently) 3(worst)/ 10  Pain location: left shoulder Pain description: dull;achy Aggravating factors: folding clothes Relieving factors: Ice; Tylenol    PRECAUTIONS: Shoulder and Fall see protocol in media  RED FLAGS:  None   WEIGHT BEARING RESTRICTIONS: No  FALLS:  Has patient fallen in last 6 months? Yes. Number of falls 2 fall. Patient had two surgeries two weeks from each other due to falling. She fell while she was at the grocery store. She was turning and her husband said her feet did not move with her. PT to address balance   LIVING ENVIRONMENT: Lives with: lives with their spouse Lives in: House/apartment Stairs: Yes: Internal: 12 steps; on left going up Has following equipment at  home: Single point cane and Walker - 2 wheeled  OCCUPATION: Retired  PLOF: Independent, Independent with basic ADLs, Independent with household mobility without device, Independent with community mobility without  device, Independent with gait, and Independent with transfers  PATIENT GOALS: use of my arm again. To get back to water  aerobics and drive  NEXT MD VISIT: September 30th   OBJECTIVE:  Note: Objective measures were completed at Evaluation unless otherwise noted.   PATIENT SURVEYS :  QuickDASH : 52.3/100 52.3%  COGNITION: Overall cognitive status: Within functional limits for tasks assessed     SENSATION: WFL  POSTURE: Rounded shoulder & forward head Standing posture/ gait: right pelvic shift and left trunk lean  UPPER EXTREMITY ROM:   Passive ROM Right eval Left eval  Shoulder flexion  90  Shoulder extension  NT due to restrictions  Shoulder abduction  75  Shoulder adduction    Shoulder internal rotation  NT due to restrictions  Shoulder external rotation  NT due to restrictions  Elbow flexion    Elbow extension    Wrist flexion    Wrist extension    Wrist ulnar deviation    Wrist radial deviation    Wrist pronation    Wrist supination    (Blank rows = not tested)  UPPER EXTREMITY MMT: NT due to weight restrictions of 1lb   FUNCTIONAL TESTING 5STS: 17.47 sec no UE support TUG:14.32 sec  JOINT MOBILITY TESTING:  See PROM measurement above Open end feel                                                                                                                              TREATMENT DATE:  10/27/23: Shoulder pulleys seated 2' each: flexion and scaption to 90 deg Standing Lt shoulder isometric 5x5 flexion and abd - needed demo and technique cueing for proper form and to not overdo effort (was having pain) Seated scapular retraction 10x5 Supine AA/ROM dowel flexion to 90 deg Supine P/ROM Lt shoulder: flexion to 100, scaption to 100, ER to 20  deg - all painfree  Scar massage for improved mobility (anterior shoulder) Finger ladder: 5 rounds to number 25 Ice Lt shoulder x 8'   10/25/2023  Initial Evaluation & HEP created Patient education on safe icing technique; review of restrictions  PATIENT EDUCATION: Education details: PT eval findings, anticipated POC, progress with PT, and initial HEP Person educated: Patient Education method: Explanation, Demonstration, and Handouts Education comprehension: verbalized understanding, returned demonstration, and needs further education  HOME EXERCISE PROGRAM: Access Code: [email or web address] URL: [email or web address] Date: 10/25/2023 Prepared by: Kristeen Sar  Exercises - Supine Shoulder Flexion with Dowel  - 1 x daily - 7 x weekly - 1-2 sets - 10 reps - Seated Scapular Retraction  - 1 x daily - 7 x weekly - 1 sets - 10 reps - Isometric Shoulder Flexion  - 1 x daily - 7 x weekly - 2 sets - 5 reps - 5 hold - Standing Isometric Shoulder Abduction with Doorway - Arm Bent  - 1 x daily - 7 x weekly - 2 sets - 5 reps - 5 hold - Seated  Shoulder Flexion AAROM with Pulley Behind  - 1 x daily - 7 x weekly - 2 sets - 10 reps - Seated Shoulder Scaption AAROM with Pulley at Side  - 1 x daily - 7 x weekly - 2 sets - 10 reps  ASSESSMENT:  CLINICAL IMPRESSION: Pt is nearing 4 weeks post-op.  She has achieved first phase of ROM and has low grade pain.  She needed cueing on shoulder isometrics for technique as she was over-doing effort and having pain.  Scar is slightly tacked down so added scar mobs to manual P/ROM today.  Pt understands that next week she will move to next phase of protocol and advance ROM.  She has been compliant with icing at home.     OBJECTIVE IMPAIRMENTS: Abnormal gait, decreased balance, decreased ROM, decreased strength, hypomobility, increased muscle spasms, impaired flexibility, impaired UE functional use, postural dysfunction, and pain.   ACTIVITY LIMITATIONS:  carrying, lifting, bending, squatting, stairs, bathing, dressing, reach over head, and hygiene/grooming  PARTICIPATION LIMITATIONS: meal prep, cleaning, laundry, interpersonal relationship, driving, shopping, and community activity  PERSONAL FACTORS: Age and 3+ comorbidities: falls risk HTN; anxiety; depression are also affecting patient's functional outcome.   REHAB POTENTIAL: Good  CLINICAL DECISION MAKING: Evolving/moderate complexity  EVALUATION COMPLEXITY: Moderate  GOALS: Goals reviewed with patient? Yes  SHORT TERM GOALS: Target date: 11/22/2023   Patient will be independent with initial HEP. Baseline:  Goal status: INITIAL  2.  Patient will report > or = to 40% use of Lt UE with light home and self care tasks due to improved ROM and strength. Baseline:  Goal status: INITIAL  3.  Patient will demonstrate correct seated and standing posture to reduce mechanical strain. Baseline:  Goal status: INITIAL  4.  .  Patient will demonstrate > or = to 100 degrees in Lt  shoulder P/ROM for improved reaching. Baseline:  Goal status: INITIAL   LONG TERM GOALS: Target date: 12/20/2023  Patient will demonstrate independence in advanced HEP. Baseline:  Goal status: INITIAL  2.  .  Patient will report > or = to 70% of normal use of Rt UE for ADLs due to improved functional strength and A/ROM. Baseline:  Goal status: INITIAL  3.  Patient will be able to return to return water  aerobics program with no difficulty to establish normal exercise routine Baseline:  Goal status: INITIAL   4.  Patient will be able to wash dishes and fold clothes with < or = to 2/10 left shoulder pain to for improved functional use of extremity. Baseline:  Goal status: INITIAL  5.  Patient will demonstrate > or = to 115 degrees in Rt shoulder A/ROM for improved reaching overhead. Baseline:  Goal status: INITIAL  6.  Patient's Annitta will be 64/100 for improved functional use of Lt UE and  decreased disability.  Baseline: 52.3/100 Goal status: INITIAL  PLAN: PT FREQUENCY: 1-2x/week  PT DURATION: 8 weeks  PLANNED INTERVENTIONS: [id number]- PT Re-evaluation, 97110-Therapeutic exercises, 97530- Therapeutic activity, 97112- Neuromuscular re-education, 97535- Self Care, [id number]- Manual therapy, [phone]- Gait training, [phone]- Canalith repositioning, [id number]- Aquatic Therapy, [phone]- Electrical stimulation (unattended), [phone]- Electrical stimulation (manual), [id number]- Vasopneumatic device, [id number]- Ultrasound, [id number]- Ionotophoresis 4mg /ml Dexamethasone , [id number] (1-2 muscles), [id number] (3+ muscles)- Dry Needling, Patient/Family education, Balance training, Stair training, Taping, Joint mobilization, Joint manipulation, Spinal manipulation, Spinal mobilization, Scar mobilization, Vestibular training, Cryotherapy, and Moist heat  PLAN FOR NEXT SESSION: Review HEP; pulley; finger ladder; follow protocol (in media); assess balance   Orvil Fester, PT  10/27/23 11:03 AM  [facility] [address], [address] [place], [place] [address] Phone # [phone] Fax [phone]

## 2023-11-03 ENCOUNTER — Ambulatory Visit: Attending: Orthopaedic Surgery

## 2023-11-03 DIAGNOSIS — M25512 Pain in left shoulder: Secondary | ICD-10-CM | POA: Diagnosis not present

## 2023-11-03 DIAGNOSIS — R293 Abnormal posture: Secondary | ICD-10-CM | POA: Diagnosis not present

## 2023-11-03 DIAGNOSIS — M25612 Stiffness of left shoulder, not elsewhere classified: Secondary | ICD-10-CM | POA: Diagnosis not present

## 2023-11-03 DIAGNOSIS — G8929 Other chronic pain: Secondary | ICD-10-CM | POA: Insufficient documentation

## 2023-11-03 DIAGNOSIS — R2681 Unsteadiness on feet: Secondary | ICD-10-CM | POA: Diagnosis not present

## 2023-11-03 DIAGNOSIS — M6281 Muscle weakness (generalized): Secondary | ICD-10-CM | POA: Diagnosis not present

## 2023-11-03 NOTE — Therapy (Signed)
 OUTPATIENT PHYSICAL THERAPY UPPER EXTREMITY TREATMENT NOTE   Patient Name: Michelle Johnston MRN: [id number] DOB:[birth year], 73 y.o., female Today's Date: 11/03/2023  END OF SESSION:  PT End of Session - 11/03/23 1231     Visit Number 3    Number of Visits 16    Date for PT Re-Evaluation 12/20/23    Authorization Type 16 visits 10/25/23 to 12/20/23    Authorization - Visit Number 3    Authorization - Number of Visits 16    Progress Note Due on Visit 10    PT Start Time 1231    PT Stop Time 1309    PT Time Calculation (min) 38 min    Activity Tolerance Patient tolerated treatment well    Behavior During Therapy [facility] for tasks assessed/performed           Past Medical History:  Diagnosis Date   Anxiety    Arthritis    Atrial fibrillation (HCC)    Bradycardia    Bronchitis    Depression    Dyspnea    GERD (gastroesophageal reflux disease)    Glaucoma    Hypertension    Presence of Watchman left atrial appendage closure device 08/07/2021   Watchman FLX 27mm with Dr. Cindie   Seasonal allergies    Sleep apnea    does not use CPAP   Wears glasses    Past Surgical History:  Procedure Laterality Date   ABDOMINAL HYSTERECTOMY  1990   ATRIAL FIBRILLATION ABLATION N/A 08/12/2020   Procedure: ATRIAL FIBRILLATION ABLATION;  Surgeon: Cindie Ole DASEN, MD;  Location: [facility];  Service: Cardiovascular;  Laterality: N/A;   ATRIAL FIBRILLATION ABLATION N/A 03/26/2023   Procedure: ATRIAL FIBRILLATION ABLATION;  Surgeon: Cindie Ole DASEN, MD;  Location: [facility] CV [facility];  Service: Cardiovascular;  Laterality: N/A;   BREAST BIOPSY Right    benign   CARDIOVERSION N/A 08/15/2019   Procedure: CARDIOVERSION;  Surgeon: Elmira Newman PARAS, MD;  Location: [facility] ENDOS[facility];  Service: Cardiovascular;  Laterality: N/A;   COLONOSCOPY     LEFT ATRIAL APPENDAGE OCCLUSION N/A 08/07/2021   Procedure: LEFT ATRIAL APPENDAGE OCCLUSION;  Surgeon: Cindie Ole DASEN, MD;  Location: [facility];  Service: Cardiovascular;  Laterality: N/A;   meniscus tear knee     left knee   NASAL SINUS SURGERY  1975   ORIF ANKLE FRACTURE  2009   left   ORIF HUMERUS FRACTURE Right 07/15/2023   Procedure: OPEN REDUCTION INTERNAL FIXATION (ORIF) DISTAL HUMERUS FRACTURE;  Surgeon: Cristy Bonner DASEN, MD;  Location: [facility];  Service: Orthopedics;  Laterality: Right;   REVERSE SHOULDER ARTHROPLASTY Left 09/02/2023   Procedure: ARTHROPLASTY, SHOULDER, TOTAL, REVERSE;  Surgeon: Cristy Bonner DASEN, MD;  Location: [facility] OR;  Service: Orthopedics;  Laterality: Left;   TEE WITHOUT CARDIOVERSION N/A 08/07/2021   Procedure: TRANSESOPHAGEAL ECHOCARDIOGRAM (TEE);  Surgeon: Cindie Ole DASEN, MD;  Location: [facility];  Service: Cardiovascular;  Laterality: N/A;   TOTAL KNEE ARTHROPLASTY Left 02/03/2019   Procedure: LEFT TOTAL KNEE ARTHROPLASTY;  Surgeon: Vernetta Lonni GRADE, MD;  Location: [facility] ORS;  Service: Orthopedics;  Laterality: Left;   TURBINATE REDUCTION Bilateral 06/04/2014   Procedure: BILATERAL TURBINATE REDUCTION;  Surgeon: Daniel Moccasin, MD;  Location:  SURGERY CENTER;  Service: ENT;  Laterality: Bilateral;   TYMPANOSTOMY TUBE PLACEMENT     left   Patient Active Problem List   Diagnosis Date Noted   Near syncope 09/11/2023   Impacted cerumen  of left ear 08/15/2023   Other specified disorders of eustachian tube, left ear 02/06/2023   Central perforation of tympanic membrane of left ear 02/06/2023   Presence of Watchman left atrial appendage closure device 08/07/2021   Atrial fibrillation (HCC) 08/07/2021   OSA (obstructive sleep apnea) 10/29/2020   Persistent atrial fibrillation (HCC) 03/27/2020   Hypercoagulable state due to persistent atrial fibrillation (HCC) 03/27/2020   PAD (peripheral artery disease) (HCC) 12/27/2019   Exertional chest pain 12/27/2019   Primary osteoarthritis of right knee 07/12/2019   Paroxysmal atrial fibrillation (HCC) 06/16/2019   Nonrheumatic  aortic valve insufficiency 06/16/2019   Nonrheumatic mitral valve regurgitation 06/16/2019   Tachycardia 02/20/2019   Abnormal EKG 02/20/2019   Essential hypertension 02/20/2019   Status post total left knee replacement 02/03/2019   Unilateral primary osteoarthritis, left knee 11/30/2016   Chronic pain of left knee 11/30/2016    PCP:   Loreli Kins, MD    REFERRING PROVIDER: Cristy Bonner DASEN, MD  REFERRING DIAG: left total shoulder arthoplasty ORIF tuberosities  THERAPY DIAG:  Chronic left shoulder pain  Stiffness of left shoulder, not elsewhere classified  Muscle weakness (generalized)  Abnormal posture  Rationale for Evaluation and Treatment: Rehabilitation  ONSET DATE: Surgery 09/30/2023  SUBJECTIVE:                                                                                                                                                                                      SUBJECTIVE STATEMENT: Patient reports her pain at 2-3/10.  Nothing bad  I'm itching to get back to water  aerobics Hand dominance: Right  PERTINENT HISTORY: Anxiety; OA; Depression; HTN; Hx left TKA  PAIN:  11/03/23 Are you having pain? Yes: NPRS scale: 2-3/ 10  Pain location: left shoulder Pain description: dull;achy Aggravating factors: folding clothes Relieving factors: Ice; Tylenol    PRECAUTIONS: Shoulder and Fall see protocol in media  RED FLAGS:  None   WEIGHT BEARING RESTRICTIONS: No  FALLS:  Has patient fallen in last 6 months? Yes. Number of falls 2 fall. Patient had two surgeries two weeks from each other due to falling. She fell while she was at the grocery store. She was turning and her husband said her feet did not move with her. PT to address balance   LIVING ENVIRONMENT: Lives with: lives with their spouse Lives in: House/apartment Stairs: Yes: Internal: 12 steps; on left going up Has following equipment at home: Single point cane and Walker - 2  wheeled  OCCUPATION: Retired  PLOF: Independent, Independent with basic ADLs, Independent with household mobility without device, Independent with community mobility without device, Independent with gait, and Independent with transfers  PATIENT GOALS: use of my arm again. To get back to water  aerobics and drive  NEXT MD VISIT: September 30th   OBJECTIVE:  Note: Objective measures were completed at Evaluation unless otherwise noted.   PATIENT SURVEYS :  QuickDASH : 52.3/100 52.3%  COGNITION: Overall cognitive status: Within functional limits for tasks assessed     SENSATION: WFL  POSTURE: Rounded shoulder & forward head Standing posture/ gait: right pelvic shift and left trunk lean  UPPER EXTREMITY ROM:   Passive ROM Right eval Left eval  Shoulder flexion  90  Shoulder extension  NT due to restrictions  Shoulder abduction  75  Shoulder adduction    Shoulder internal rotation  NT due to restrictions  Shoulder external rotation  NT due to restrictions  Elbow flexion    Elbow extension    Wrist flexion    Wrist extension    Wrist ulnar deviation    Wrist radial deviation    Wrist pronation    Wrist supination    (Blank rows = not tested)  UPPER EXTREMITY MMT: NT due to weight restrictions of 1lb   FUNCTIONAL TESTING 5STS: 17.47 sec no UE support TUG:14.32 sec  JOINT MOBILITY TESTING:  See PROM measurement above Open end feel                                                                                                                              TREATMENT DATE:  11/03/23: UBE x 4 min (3 fwd and 1 min backward) 3 way scapular stabilization on both sides but no band when doing left Seated shoulder ER with yellow loop 2 x 10 (very little to no activation on left) Seated shoulder horizontal abduction (low range) 2 x 10 Standing shoulder extension and rows with yellow tband (PT anchoring) (standing to work on shoulder strength and balance) Standing in front  of mirror for visual feedback bilateral shoulder flexion and scaption 2 x 10 each with manual feed back to avoid compensation: patient only able to do approx 45 degrees of flexion without compensation Finger ladder x 5 Supine shoulder flexion 2 x 10 bilateral Supine shoulder alphabet A-Z Supine shoulder circles cw and ccw x 20 each Side lying on right side: shoulder abduction 2 x 10 Seated with arm propped on bed at approx 75 degrees abduction, ER 2 x 10 with heavy verbal and tactile cues for correct angle   10/27/23: Shoulder pulleys seated 2' each: flexion and scaption to 90 deg Standing Lt shoulder isometric 5x5 flexion and abd - needed demo and technique cueing for proper form and to not overdo effort (was having pain) Seated scapular retraction 10x5 Supine AA/ROM dowel flexion to 90 deg Supine P/ROM Lt shoulder: flexion to 100, scaption to 100, ER to 20 deg - all painfree  Scar massage for improved mobility (anterior shoulder) Finger ladder: 5 rounds to number 25 Ice Lt shoulder x 8'   10/25/2023  Initial Evaluation & HEP  created Patient education on safe icing technique; review of restrictions  PATIENT EDUCATION: Education details: PT eval findings, anticipated POC, progress with PT, and initial HEP Person educated: Patient Education method: Explanation, Demonstration, and Handouts Education comprehension: verbalized understanding, returned demonstration, and needs further education  HOME EXERCISE PROGRAM: Access Code: [email or web address] URL: [email or web address] Date: 10/25/2023 Prepared by: Kristeen Sar  Exercises - Supine Shoulder Flexion with Dowel  - 1 x daily - 7 x weekly - 1-2 sets - 10 reps - Seated Scapular Retraction  - 1 x daily - 7 x weekly - 1 sets - 10 reps - Isometric Shoulder Flexion  - 1 x daily - 7 x weekly - 2 sets - 5 reps - 5 hold - Standing Isometric Shoulder Abduction with Doorway - Arm Bent  - 1 x daily - 7 x weekly - 2 sets - 5 reps - 5  hold - Seated Shoulder Flexion AAROM with Pulley Behind  - 1 x daily - 7 x weekly - 2 sets - 10 reps - Seated Shoulder Scaption AAROM with Pulley at Side  - 1 x daily - 7 x weekly - 2 sets - 10 reps  ASSESSMENT:  CLINICAL IMPRESSION: Pt is progressing appropriately.  We initiated next phase of protocol.  She is compensating heavily on fwd flexion and scaption against gravity but does well with gravity eliminated activity.  She should continue to improve.  She continues to be compliant with icing at home.     OBJECTIVE IMPAIRMENTS: Abnormal gait, decreased balance, decreased ROM, decreased strength, hypomobility, increased muscle spasms, impaired flexibility, impaired UE functional use, postural dysfunction, and pain.   ACTIVITY LIMITATIONS: carrying, lifting, bending, squatting, stairs, bathing, dressing, reach over head, and hygiene/grooming  PARTICIPATION LIMITATIONS: meal prep, cleaning, laundry, interpersonal relationship, driving, shopping, and community activity  PERSONAL FACTORS: Age and 3+ comorbidities: falls risk HTN; anxiety; depression are also affecting patient's functional outcome.   REHAB POTENTIAL: Good  CLINICAL DECISION MAKING: Evolving/moderate complexity  EVALUATION COMPLEXITY: Moderate  GOALS: Goals reviewed with patient? Yes  SHORT TERM GOALS: Target date: 11/22/2023   Patient will be independent with initial HEP. Baseline:  Goal status: In Progress  2.  Patient will report > or = to 40% use of Lt UE with light home and self care tasks due to improved ROM and strength. Baseline:  Goal status: In Progress  3.  Patient will demonstrate correct seated and standing posture to reduce mechanical strain. Baseline:  Goal status: INITIAL  4.  .  Patient will demonstrate > or = to 100 degrees in Lt  shoulder P/ROM for improved reaching. Baseline:  Goal status: INITIAL   LONG TERM GOALS: Target date: 12/20/2023  Patient will demonstrate independence in  advanced HEP. Baseline:  Goal status: INITIAL  2.  .  Patient will report > or = to 70% of normal use of Rt UE for ADLs due to improved functional strength and A/ROM. Baseline:  Goal status: INITIAL  3.  Patient will be able to return to return water  aerobics program with no difficulty to establish normal exercise routine Baseline:  Goal status: INITIAL   4.  Patient will be able to wash dishes and fold clothes with < or = to 2/10 left shoulder pain to for improved functional use of extremity. Baseline:  Goal status: INITIAL  5.  Patient will demonstrate > or = to 115 degrees in Rt shoulder A/ROM for improved reaching overhead. Baseline:  Goal status: INITIAL  6.  Patient's Annitta will  be 64/100 for improved functional use of Lt UE and decreased disability.  Baseline: 52.3/100 Goal status: INITIAL  PLAN: PT FREQUENCY: 1-2x/week  PT DURATION: 8 weeks  PLANNED INTERVENTIONS: 97164- PT Re-evaluation, 97110-Therapeutic exercises, 97530- Therapeutic activity, 97112- Neuromuscular re-education, 97535- Self Care, [id number]- Manual therapy, [phone]- Gait training, [phone]- Canalith repositioning, [id number]- Aquatic Therapy, [phone]- Electrical stimulation (unattended), [phone]- Electrical stimulation (manual), [id number]- Vasopneumatic device, [id number]- Ultrasound, [id number]- Ionotophoresis 4mg /ml Dexamethasone , [id number] (1-2 muscles), [id number] (3+ muscles)- Dry Needling, Patient/Family education, Balance training, Stair training, Taping, Joint mobilization, Joint manipulation, Spinal manipulation, Spinal mobilization, Scar mobilization, Vestibular training, Cryotherapy, and Moist heat  PLAN FOR NEXT SESSION: Progress HEP; pulley; finger ladder; follow protocol (in media); assess balance   Elijha Dedman B. Kaliah Haddaway, PT 11/03/23 1:15 PM [facility] [address] [place], [place] [address] Phone # [phone] Fax [phone]

## 2023-11-08 ENCOUNTER — Encounter: Payer: Self-pay | Admitting: Physical Therapy

## 2023-11-08 ENCOUNTER — Ambulatory Visit: Admitting: Physical Therapy

## 2023-11-08 DIAGNOSIS — M25512 Pain in left shoulder: Secondary | ICD-10-CM | POA: Diagnosis not present

## 2023-11-08 DIAGNOSIS — R293 Abnormal posture: Secondary | ICD-10-CM | POA: Diagnosis not present

## 2023-11-08 DIAGNOSIS — R2681 Unsteadiness on feet: Secondary | ICD-10-CM | POA: Diagnosis not present

## 2023-11-08 DIAGNOSIS — M25612 Stiffness of left shoulder, not elsewhere classified: Secondary | ICD-10-CM

## 2023-11-08 DIAGNOSIS — M6281 Muscle weakness (generalized): Secondary | ICD-10-CM

## 2023-11-08 DIAGNOSIS — G8929 Other chronic pain: Secondary | ICD-10-CM

## 2023-11-08 NOTE — Therapy (Signed)
 OUTPATIENT PHYSICAL THERAPY UPPER EXTREMITY TREATMENT NOTE   Patient Name: Michelle Johnston MRN: [id number] DOB:1950/08/11, 73 y.o., female Today's Date: 11/08/2023  END OF SESSION:  PT End of Session - 11/08/23 1104     Visit Number 4    Number of Visits 16    Date for PT Re-Evaluation 12/20/23    Authorization Type 16 visits 10/25/23 to 12/20/23    Authorization - Visit Number 4    Authorization - Number of Visits 16    Progress Note Due on Visit 10    PT Start Time 1102    PT Stop Time 1155    PT Time Calculation (min) 53 min    Activity Tolerance Patient tolerated treatment well    Behavior During Therapy [facility] for tasks assessed/performed            Past Medical History:  Diagnosis Date   Anxiety    Arthritis    Atrial fibrillation (HCC)    Bradycardia    Bronchitis    Depression    Dyspnea    GERD (gastroesophageal reflux disease)    Glaucoma    Hypertension    Presence of Watchman left atrial appendage closure device 08/07/2021   Watchman FLX 27mm with Dr. Cindie   Seasonal allergies    Sleep apnea    does not use CPAP   Wears glasses    Past Surgical History:  Procedure Laterality Date   ABDOMINAL HYSTERECTOMY  1990   ATRIAL FIBRILLATION ABLATION N/A 08/12/2020   Procedure: ATRIAL FIBRILLATION ABLATION;  Surgeon: Cindie Ole DASEN, MD;  Location: [facility];  Service: Cardiovascular;  Laterality: N/A;   ATRIAL FIBRILLATION ABLATION N/A 03/26/2023   Procedure: ATRIAL FIBRILLATION ABLATION;  Surgeon: Cindie Ole DASEN, MD;  Location: [facility];  Service: Cardiovascular;  Laterality: N/A;   BREAST BIOPSY Right    benign   CARDIOVERSION N/A 08/15/2019   Procedure: CARDIOVERSION;  Surgeon: Elmira Newman PARAS, MD;  Location: [facility];  Service: Cardiovascular;  Laterality: N/A;   COLONOSCOPY     LEFT ATRIAL APPENDAGE OCCLUSION N/A 08/07/2021   Procedure: LEFT ATRIAL APPENDAGE OCCLUSION;  Surgeon: Cindie Ole DASEN, MD;  Location: [facility];  Service: Cardiovascular;  Laterality: N/A;   meniscus tear knee     left knee   NASAL SINUS SURGERY  1975   ORIF ANKLE FRACTURE  2009   left   ORIF HUMERUS FRACTURE Right 07/15/2023   Procedure: OPEN REDUCTION INTERNAL FIXATION (ORIF) DISTAL HUMERUS FRACTURE;  Surgeon: Cristy Bonner DASEN, MD;  Location: [facility];  Service: Orthopedics;  Laterality: Right;   REVERSE SHOULDER ARTHROPLASTY Left 09/02/2023   Procedure: ARTHROPLASTY, SHOULDER, TOTAL, REVERSE;  Surgeon: Cristy Bonner DASEN, MD;  Location: [facility] OR;  Service: Orthopedics;  Laterality: Left;   TEE WITHOUT CARDIOVERSION N/A 08/07/2021   Procedure: TRANSESOPHAGEAL ECHOCARDIOGRAM (TEE);  Surgeon: Cindie Ole DASEN, MD;  Location: [facility] IN[facility] CV LAB;  Service: Cardiovascular;  Laterality: N/A;   TOTAL KNEE ARTHROPLASTY Left 02/03/2019   Procedure: LEFT TOTAL KNEE ARTHROPLASTY;  Surgeon: Vernetta Lonni GRADE, MD;  Location: [facility];  Service: Orthopedics;  Laterality: Left;   TURBINATE REDUCTION Bilateral 06/04/2014   Procedure: BILATERAL TURBINATE REDUCTION;  Surgeon: Daniel Moccasin, MD;  Location: [facility];  Service: ENT;  Laterality: Bilateral;   TYMPANOSTOMY TUBE PLACEMENT     left   Patient Active Problem List   Diagnosis Date Noted   Near syncope 09/11/2023   Impacted  cerumen of left ear 08/15/2023   Other specified disorders of eustachian tube, left ear 02/06/2023   Central perforation of tympanic membrane of left ear 02/06/2023   Presence of Watchman left atrial appendage closure device 08/07/2021   Atrial fibrillation (HCC) 08/07/2021   OSA (obstructive sleep apnea) 10/29/2020   Persistent atrial fibrillation (HCC) 03/27/2020   Hypercoagulable state due to persistent atrial fibrillation (HCC) 03/27/2020   PAD (peripheral artery disease) (HCC) 12/27/2019   Exertional chest pain 12/27/2019   Primary osteoarthritis of right knee 07/12/2019   Paroxysmal atrial fibrillation (HCC) 06/16/2019   Nonrheumatic  aortic valve insufficiency 06/16/2019   Nonrheumatic mitral valve regurgitation 06/16/2019   Tachycardia 02/20/2019   Abnormal EKG 02/20/2019   Essential hypertension 02/20/2019   Status post total left knee replacement 02/03/2019   Unilateral primary osteoarthritis, left knee 11/30/2016   Chronic pain of left knee 11/30/2016    PCP:   Loreli Kins, MD    REFERRING PROVIDER: Cristy Bonner DASEN, MD  REFERRING DIAG: left total shoulder arthoplasty ORIF tuberosities  THERAPY DIAG:  Chronic left shoulder pain  Stiffness of left shoulder, not elsewhere classified  Muscle weakness (generalized)  Abnormal posture  Rationale for Evaluation and Treatment: Rehabilitation  ONSET DATE: Surgery 09/30/2023  SUBJECTIVE:                                                                                                                                                                                      SUBJECTIVE STATEMENT: Pt was slightly sore but expected that after last session.  She is back to driving.  I am going to get better! Hand dominance: Right  PERTINENT HISTORY: Anxiety; OA; Depression; HTN; Hx left TKA  PAIN:  11/03/23 Are you having pain? Yes: NPRS scale: 2/ 10  Pain location: left shoulder Pain description: dull;achy Aggravating factors: folding clothes Relieving factors: Ice; Tylenol    PRECAUTIONS: Shoulder and Fall see protocol in media  RED FLAGS:  None   WEIGHT BEARING RESTRICTIONS: No  FALLS:  Has patient fallen in last 6 months? Yes. Number of falls 2 fall. Patient had two surgeries two weeks from each other due to falling. She fell while she was at the grocery store. She was turning and her husband said her feet did not move with her. PT to address balance   LIVING ENVIRONMENT: Lives with: lives with their spouse Lives in: House/apartment Stairs: Yes: Internal: 12 steps; on left going up Has following equipment at home: Single point cane and Walker - 2  wheeled  OCCUPATION: Retired  PLOF: Independent, Independent with basic ADLs, Independent with household mobility without device, Independent with community mobility without device, Independent with  gait, and Independent with transfers  PATIENT GOALS: use of my arm again. To get back to water  aerobics and drive  NEXT MD VISIT: September 30th   OBJECTIVE:  Note: Objective measures were completed at Evaluation unless otherwise noted.   PATIENT SURVEYS :  QuickDASH : 52.3/100 52.3%  COGNITION: Overall cognitive status: Within functional limits for tasks assessed     SENSATION: WFL  POSTURE: Rounded shoulder & forward head Standing posture/ gait: right pelvic shift and left trunk lean  UPPER EXTREMITY ROM:   Passive ROM Right eval Left eval  Shoulder flexion  90  Shoulder extension  NT due to restrictions  Shoulder abduction  75  Shoulder adduction    Shoulder internal rotation  NT due to restrictions  Shoulder external rotation  NT due to restrictions  Elbow flexion    Elbow extension    Wrist flexion    Wrist extension    Wrist ulnar deviation    Wrist radial deviation    Wrist pronation    Wrist supination    (Blank rows = not tested)  UPPER EXTREMITY MMT: NT due to weight restrictions of 1lb   FUNCTIONAL TESTING 5STS: 17.47 sec no UE support TUG:14.32 sec  JOINT MOBILITY TESTING:  See PROM measurement above Open end feel                                                                                                                              TREATMENT DATE:  11/08/23: UBE L1 3' fwd, 2' bwd PT present to discuss status Seated bil shoulder row and ext 2x15 PT holding band as anchor Finger ladder x5, then x5 with step back and lower to side Supine dowel flexion and scaption with 2lb ankle weight on dowel x20 each Supine shoulder alphabet A-Z Supine shoulder circles cw and ccw x 20 each Lt SL scapular clocks 3:00/9:00, 11:00/5:00 - passive, AA/ROM  with PT, then resisted against manual resistance by PT Lt shoulder flexion to 90 in Rt SL x10, Lt shoulder abd in Rt SL x 10 Scar mobs and gentle STM to posterior and anterior shoulder end of session  11/03/23: UBE x 4 min (3 fwd and 1 min backward) 3 way scapular stabilization on both sides but no band when doing left Seated shoulder ER with yellow loop 2 x 10 (very little to no activation on left) Seated shoulder horizontal abduction (low range) 2 x 10 Standing shoulder extension and rows with yellow tband (PT anchoring) (standing to work on shoulder strength and balance) Standing in front of mirror for visual feedback bilateral shoulder flexion and scaption 2 x 10 each with manual feed back to avoid compensation: patient only able to do approx 45 degrees of flexion without compensation Finger ladder x 5 Supine shoulder flexion 2 x 10 bilateral Supine shoulder alphabet A-Z Supine shoulder circles cw and ccw x 20 each Side lying on right side: shoulder abduction 2 x 10 Seated with  arm propped on bed at approx 75 degrees abduction, ER 2 x 10 with heavy verbal and tactile cues for correct angle   10/27/23: Shoulder pulleys seated 2' each: flexion and scaption to 90 deg Standing Lt shoulder isometric 5x5 flexion and abd - needed demo and technique cueing for proper form and to not overdo effort (was having pain) Seated scapular retraction 10x5 Supine AA/ROM dowel flexion to 90 deg Supine P/ROM Lt shoulder: flexion to 100, scaption to 100, ER to 20 deg - all painfree  Scar massage for improved mobility (anterior shoulder) Finger ladder: 5 rounds to number 25 Ice Lt shoulder x 8'   PATIENT EDUCATION: Education details: PT eval findings, anticipated POC, progress with PT, and initial HEP Person educated: Patient Education method: Explanation, Demonstration, and Handouts Education comprehension: verbalized understanding, returned demonstration, and needs further education  HOME EXERCISE  PROGRAM: Access Code: [email or web address] URL: [email or web address] Date: 10/25/2023 Prepared by: Kristeen Sar  Exercises - Supine Shoulder Flexion with Dowel  - 1 x daily - 7 x weekly - 1-2 sets - 10 reps - Seated Scapular Retraction  - 1 x daily - 7 x weekly - 1 sets - 10 reps - Isometric Shoulder Flexion  - 1 x daily - 7 x weekly - 2 sets - 5 reps - 5 hold - Standing Isometric Shoulder Abduction with Doorway - Arm Bent  - 1 x daily - 7 x weekly - 2 sets - 5 reps - 5 hold - Seated Shoulder Flexion AAROM with Pulley Behind  - 1 x daily - 7 x weekly - 2 sets - 10 reps - Seated Shoulder Scaption AAROM with Pulley at Side  - 1 x daily - 7 x weekly - 2 sets - 10 reps  ASSESSMENT:  CLINICAL IMPRESSION: Pt is progressing appropriately.  We initiated next phase of protocol.  She is compensating heavily on fwd flexion and scaption against gravity but does well with gravity eliminated activity.  Added scapular clocks today which reduced compensation in gravity eliminated position as she became more familiar with proper mechanics of shoulder girdle.  She should continue to improve.  She continues to be compliant with icing at home.     OBJECTIVE IMPAIRMENTS: Abnormal gait, decreased balance, decreased ROM, decreased strength, hypomobility, increased muscle spasms, impaired flexibility, impaired UE functional use, postural dysfunction, and pain.   ACTIVITY LIMITATIONS: carrying, lifting, bending, squatting, stairs, bathing, dressing, reach over head, and hygiene/grooming  PARTICIPATION LIMITATIONS: meal prep, cleaning, laundry, interpersonal relationship, driving, shopping, and community activity  PERSONAL FACTORS: Age and 3+ comorbidities: falls risk HTN; anxiety; depression are also affecting patient's functional outcome.   REHAB POTENTIAL: Good  CLINICAL DECISION MAKING: Evolving/moderate complexity  EVALUATION COMPLEXITY: Moderate  GOALS: Goals reviewed with patient? Yes  SHORT  TERM GOALS: Target date: 11/22/2023   Patient will be independent with initial HEP. Baseline:  Goal status: In Progress  2.  Patient will report > or = to 40% use of Lt UE with light home and self care tasks due to improved ROM and strength. Baseline:  Goal status: In Progress  3.  Patient will demonstrate correct seated and standing posture to reduce mechanical strain. Baseline:  Goal status: INITIAL  4.  .  Patient will demonstrate > or = to 100 degrees in Lt  shoulder P/ROM for improved reaching. Baseline:  Goal status: INITIAL   LONG TERM GOALS: Target date: 12/20/2023  Patient will demonstrate independence in advanced HEP. Baseline:  Goal status: INITIAL  2.  SABRA  Patient will report > or = to 70% of normal use of Rt UE for ADLs due to improved functional strength and A/ROM. Baseline:  Goal status: INITIAL  3.  Patient will be able to return to return water  aerobics program with no difficulty to establish normal exercise routine Baseline:  Goal status: INITIAL   4.  Patient will be able to wash dishes and fold clothes with < or = to 2/10 left shoulder pain to for improved functional use of extremity. Baseline:  Goal status: INITIAL  5.  Patient will demonstrate > or = to 115 degrees in Rt shoulder A/ROM for improved reaching overhead. Baseline:  Goal status: INITIAL  6.  Patient's Annitta will be 64/100 for improved functional use of Lt UE and decreased disability.  Baseline: 52.3/100 Goal status: INITIAL  PLAN: PT FREQUENCY: 1-2x/week  PT DURATION: 8 weeks  PLANNED INTERVENTIONS: [id number]- PT Re-evaluation, 97110-Therapeutic exercises, 97530- Therapeutic activity, 97112- Neuromuscular re-education, 97535- Self Care, [id number]- Manual therapy, [phone]- Gait training, [phone]- Canalith repositioning, [id number]- Aquatic Therapy, [phone]- Electrical stimulation (unattended), [phone]- Electrical stimulation (manual), [id number]- Vasopneumatic device, [id number]- Ultrasound, [id number]-  Ionotophoresis 4mg /ml Dexamethasone , [id number] (1-2 muscles), [id number] (3+ muscles)- Dry Needling, Patient/Family education, Balance training, Stair training, Taping, Joint mobilization, Joint manipulation, Spinal manipulation, Spinal mobilization, Scar mobilization, Vestibular training, Cryotherapy, and Moist heat  PLAN FOR NEXT SESSION: Progress HEP; pulley; finger ladder; follow protocol (in media); assess balance   Orvil Fester, PT 11/08/23 1:33 PM   [facility] [address], [address] [place], [place] [address] Phone # [phone] Fax [phone]

## 2023-11-09 NOTE — Therapy (Signed)
 OUTPATIENT PHYSICAL THERAPY UPPER EXTREMITY TREATMENT NOTE   Patient Name: Michelle Johnston MRN: [id number] DOB:07-16-1950, 73 y.o., female Today's Date: 11/09/2023  END OF SESSION:      Past Medical History:  Diagnosis Date   Anxiety    Arthritis    Atrial fibrillation (HCC)    Bradycardia    Bronchitis    Depression    Dyspnea    GERD (gastroesophageal reflux disease)    Glaucoma    Hypertension    Presence of Watchman left atrial appendage closure device 08/07/2021   Watchman FLX 27mm with Dr. Cindie   Seasonal allergies    Sleep apnea    does not use CPAP   Wears glasses    Past Surgical History:  Procedure Laterality Date   ABDOMINAL HYSTERECTOMY  1990   ATRIAL FIBRILLATION ABLATION N/A 08/12/2020   Procedure: ATRIAL FIBRILLATION ABLATION;  Surgeon: Cindie Ole DASEN, MD;  Location: [facility];  Service: Cardiovascular;  Laterality: N/A;   ATRIAL FIBRILLATION ABLATION N/A 03/26/2023   Procedure: ATRIAL FIBRILLATION ABLATION;  Surgeon: Cindie Ole DASEN, MD;  Location: [facility];  Service: Cardiovascular;  Laterality: N/A;   BREAST BIOPSY Right    benign   CARDIOVERSION N/A 08/15/2019   Procedure: CARDIOVERSION;  Surgeon: Elmira Newman PARAS, MD;  Location: [facility];  Service: Cardiovascular;  Laterality: N/A;   COLONOSCOPY     LEFT ATRIAL APPENDAGE OCCLUSION N/A 08/07/2021   Procedure: LEFT ATRIAL APPENDAGE OCCLUSION;  Surgeon: Cindie Ole DASEN, MD;  Location: [facility];  Service: Cardiovascular;  Laterality: N/A;   meniscus tear knee     left knee   NASAL SINUS SURGERY  1975   ORIF ANKLE FRACTURE  2009   left   ORIF HUMERUS FRACTURE Right 07/15/2023   Procedure: OPEN REDUCTION INTERNAL FIXATION (ORIF) DISTAL HUMERUS FRACTURE;  Surgeon: Cristy Bonner DASEN, MD;  Location: [facility];  Service: Orthopedics;  Laterality: Right;   REVERSE SHOULDER ARTHROPLASTY Left 09/02/2023   Procedure: ARTHROPLASTY, SHOULDER, TOTAL, REVERSE;   Surgeon: Cristy Bonner DASEN, MD;  Location: [facility];  Service: Orthopedics;  Laterality: Left;   TEE WITHOUT CARDIOVERSION N/A 08/07/2021   Procedure: TRANSESOPHAGEAL ECHOCARDIOGRAM (TEE);  Surgeon: Cindie Ole DASEN, MD;  Location: [facility];  Service: Cardiovascular;  Laterality: N/A;   TOTAL KNEE ARTHROPLASTY Left 02/03/2019   Procedure: LEFT TOTAL KNEE ARTHROPLASTY;  Surgeon: Vernetta Lonni GRADE, MD;  Location: [facility];  Service: Orthopedics;  Laterality: Left;   TURBINATE REDUCTION Bilateral 06/04/2014   Procedure: BILATERAL TURBINATE REDUCTION;  Surgeon: Daniel Moccasin, MD;  Location: [facility];  Service: ENT;  Laterality: Bilateral;   TYMPANOSTOMY TUBE PLACEMENT     left   Patient Active Problem List   Diagnosis Date Noted   Near syncope 09/11/2023   Impacted cerumen of left ear 08/15/2023   Other specified disorders of eustachian tube, left ear 02/06/2023   Central perforation of tympanic membrane of left ear 02/06/2023   Presence of Watchman left atrial appendage closure device 08/07/2021   Atrial fibrillation (HCC) 08/07/2021   OSA (obstructive sleep apnea) 10/29/2020   Persistent atrial fibrillation (HCC) 03/27/2020   Hypercoagulable state due to persistent atrial fibrillation (HCC) 03/27/2020   PAD (peripheral artery disease) (HCC) 12/27/2019   Exertional chest pain 12/27/2019   Primary osteoarthritis of right knee 07/12/2019   Paroxysmal atrial fibrillation (HCC) 06/16/2019   Nonrheumatic aortic valve insufficiency 06/16/2019   Nonrheumatic mitral valve regurgitation 06/16/2019  Tachycardia 02/20/2019   Abnormal EKG 02/20/2019   Essential hypertension 02/20/2019   Status post total left knee replacement 02/03/2019   Unilateral primary osteoarthritis, left knee 11/30/2016   Chronic pain of left knee 11/30/2016    PCP:   Loreli Kins, MD    REFERRING PROVIDER: Cristy Bonner DASEN, MD  REFERRING DIAG: left total shoulder arthoplasty ORIF  tuberosities  THERAPY DIAG:  No diagnosis found.  Rationale for Evaluation and Treatment: Rehabilitation  ONSET DATE: Surgery 09/30/2023  SUBJECTIVE:                                                                                                                                                                                      SUBJECTIVE STATEMENT: *** Hand dominance: Right  PERTINENT HISTORY: Anxiety; OA; Depression; HTN; Hx left TKA  PAIN:  11/03/23 Are you having pain? Yes: NPRS scale: 2/ 10  Pain location: left shoulder Pain description: dull;achy Aggravating factors: folding clothes Relieving factors: Ice; Tylenol    PRECAUTIONS: Shoulder and Fall see protocol in media  RED FLAGS:  None   WEIGHT BEARING RESTRICTIONS: No  FALLS:  Has patient fallen in last 6 months? Yes. Number of falls 2 fall. Patient had two surgeries two weeks from each other due to falling. She fell while she was at the grocery store. She was turning and her husband said her feet did not move with her. PT to address balance   LIVING ENVIRONMENT: Lives with: lives with their spouse Lives in: House/apartment Stairs: Yes: Internal: 12 steps; on left going up Has following equipment at home: Single point cane and Walker - 2 wheeled  OCCUPATION: Retired  PLOF: Independent, Independent with basic ADLs, Independent with household mobility without device, Independent with community mobility without device, Independent with gait, and Independent with transfers  PATIENT GOALS: use of my arm again. To get back to water  aerobics and drive  NEXT MD VISIT: September 30th   OBJECTIVE:  Note: Objective measures were completed at Evaluation unless otherwise noted.   PATIENT SURVEYS :  QuickDASH : 52.3/100 52.3%  COGNITION: Overall cognitive status: Within functional limits for tasks assessed     SENSATION: WFL  POSTURE: Rounded shoulder & forward head Standing posture/ gait: right pelvic shift  and left trunk lean  UPPER EXTREMITY ROM:   Passive ROM Right eval Left eval  Shoulder flexion  90  Shoulder extension  NT due to restrictions  Shoulder abduction  75  Shoulder adduction    Shoulder internal rotation  NT due to restrictions  Shoulder external rotation  NT due to restrictions  Elbow flexion    Elbow extension    Wrist flexion  Wrist extension    Wrist ulnar deviation    Wrist radial deviation    Wrist pronation    Wrist supination    (Blank rows = not tested)  UPPER EXTREMITY MMT: NT due to weight restrictions of 1lb   FUNCTIONAL TESTING 5STS: 17.47 sec no UE support TUG:14.32 sec  JOINT MOBILITY TESTING:  See PROM measurement above Open end feel                                                                                                                              TREATMENT DATE:  11/10/23: UBE L1 3' fwd, 2' bwd PT present to discuss status Seated bil shoulder row and ext 2x15 PT holding band as anchor Finger ladder x5, then x5 with step back and lower to side Supine dowel flexion and scaption with 2lb ankle weight on dowel x20 each Supine shoulder alphabet A-Z Supine shoulder circles cw and ccw x 20 each Lt SL scapular clocks 3:00/9:00, 11:00/5:00 - passive, AA/ROM with PT, then resisted against manual resistance by PT Lt shoulder flexion to 90 in Rt SL x10, Lt shoulder abd in Rt SL x 10 Scar mobs and gentle STM to posterior and anterior shoulder end of session   11/08/23: UBE L1 3' fwd, 2' bwd PT present to discuss status Seated bil shoulder row and ext 2x15 PT holding band as anchor Finger ladder x5, then x5 with step back and lower to side Supine dowel flexion and scaption with 2lb ankle weight on dowel x20 each Supine shoulder alphabet A-Z Supine shoulder circles cw and ccw x 20 each Lt SL scapular clocks 3:00/9:00, 11:00/5:00 - passive, AA/ROM with PT, then resisted against manual resistance by PT Lt shoulder flexion to 90 in Rt SL  x10, Lt shoulder abd in Rt SL x 10 Scar mobs and gentle STM to posterior and anterior shoulder end of session  11/03/23: UBE x 4 min (3 fwd and 1 min backward) 3 way scapular stabilization on both sides but no band when doing left Seated shoulder ER with yellow loop 2 x 10 (very little to no activation on left) Seated shoulder horizontal abduction (low range) 2 x 10 Standing shoulder extension and rows with yellow tband (PT anchoring) (standing to work on shoulder strength and balance) Standing in front of mirror for visual feedback bilateral shoulder flexion and scaption 2 x 10 each with manual feed back to avoid compensation: patient only able to do approx 45 degrees of flexion without compensation Finger ladder x 5 Supine shoulder flexion 2 x 10 bilateral Supine shoulder alphabet A-Z Supine shoulder circles cw and ccw x 20 each Side lying on right side: shoulder abduction 2 x 10 Seated with arm propped on bed at approx 75 degrees abduction, ER 2 x 10 with heavy verbal and tactile cues for correct angle   10/27/23: Shoulder pulleys seated 2' each: flexion and scaption to 90 deg Standing Lt shoulder isometric 5x5 flexion  and abd - needed demo and technique cueing for proper form and to not overdo effort (was having pain) Seated scapular retraction 10x5 Supine AA/ROM dowel flexion to 90 deg Supine P/ROM Lt shoulder: flexion to 100, scaption to 100, ER to 20 deg - all painfree  Scar massage for improved mobility (anterior shoulder) Finger ladder: 5 rounds to number 25 Ice Lt shoulder x 8'   PATIENT EDUCATION: Education details: PT eval findings, anticipated POC, progress with PT, and initial HEP Person educated: Patient Education method: Explanation, Demonstration, and Handouts Education comprehension: verbalized understanding, returned demonstration, and needs further education  HOME EXERCISE PROGRAM: Access Code: [email or web address] URL: [email or web address] Date:  10/25/2023 Prepared by: Kristeen Sar  Exercises - Supine Shoulder Flexion with Dowel  - 1 x daily - 7 x weekly - 1-2 sets - 10 reps - Seated Scapular Retraction  - 1 x daily - 7 x weekly - 1 sets - 10 reps - Isometric Shoulder Flexion  - 1 x daily - 7 x weekly - 2 sets - 5 reps - 5 hold - Standing Isometric Shoulder Abduction with Doorway - Arm Bent  - 1 x daily - 7 x weekly - 2 sets - 5 reps - 5 hold - Seated Shoulder Flexion AAROM with Pulley Behind  - 1 x daily - 7 x weekly - 2 sets - 10 reps - Seated Shoulder Scaption AAROM with Pulley at Side  - 1 x daily - 7 x weekly - 2 sets - 10 reps  ASSESSMENT:  CLINICAL IMPRESSION: ***     OBJECTIVE IMPAIRMENTS: Abnormal gait, decreased balance, decreased ROM, decreased strength, hypomobility, increased muscle spasms, impaired flexibility, impaired UE functional use, postural dysfunction, and pain.   ACTIVITY LIMITATIONS: carrying, lifting, bending, squatting, stairs, bathing, dressing, reach over head, and hygiene/grooming  PARTICIPATION LIMITATIONS: meal prep, cleaning, laundry, interpersonal relationship, driving, shopping, and community activity  PERSONAL FACTORS: Age and 3+ comorbidities: falls risk HTN; anxiety; depression are also affecting patient's functional outcome.   REHAB POTENTIAL: Good  CLINICAL DECISION MAKING: Evolving/moderate complexity  EVALUATION COMPLEXITY: Moderate  GOALS: Goals reviewed with patient? Yes  SHORT TERM GOALS: Target date: 11/22/2023   Patient will be independent with initial HEP. Baseline:  Goal status: In Progress  2.  Patient will report > or = to 40% use of Lt UE with light home and self care tasks due to improved ROM and strength. Baseline:  Goal status: In Progress  3.  Patient will demonstrate correct seated and standing posture to reduce mechanical strain. Baseline:  Goal status: INITIAL  4.  .  Patient will demonstrate > or = to 100 degrees in Lt  shoulder P/ROM for improved  reaching. Baseline:  Goal status: INITIAL   LONG TERM GOALS: Target date: 12/20/2023  Patient will demonstrate independence in advanced HEP. Baseline:  Goal status: INITIAL  2.  .  Patient will report > or = to 70% of normal use of Rt UE for ADLs due to improved functional strength and A/ROM. Baseline:  Goal status: INITIAL  3.  Patient will be able to return to return water  aerobics program with no difficulty to establish normal exercise routine Baseline:  Goal status: INITIAL   4.  Patient will be able to wash dishes and fold clothes with < or = to 2/10 left shoulder pain to for improved functional use of extremity. Baseline:  Goal status: INITIAL  5.  Patient will demonstrate > or = to 115 degrees in Rt shoulder A/ROM for improved  reaching overhead. Baseline:  Goal status: INITIAL  6.  Patient's Annitta will be 64/100 for improved functional use of Lt UE and decreased disability.  Baseline: 52.3/100 Goal status: INITIAL  PLAN: PT FREQUENCY: 1-2x/week  PT DURATION: 8 weeks  PLANNED INTERVENTIONS: [id number]- PT Re-evaluation, 97110-Therapeutic exercises, 97530- Therapeutic activity, 97112- Neuromuscular re-education, 97535- Self Care, [id number]- Manual therapy, [phone]- Gait training, [phone]- Canalith repositioning, [id number]- Aquatic Therapy, [phone]- Electrical stimulation (unattended), [phone]- Electrical stimulation (manual), [id number]- Vasopneumatic device, [id number]- Ultrasound, [id number]- Ionotophoresis 4mg /ml Dexamethasone , 79439 (1-2 muscles), [id number] (3+ muscles)- Dry Needling, Patient/Family education, Balance training, Stair training, Taping, Joint mobilization, Joint manipulation, Spinal manipulation, Spinal mobilization, Scar mobilization, Vestibular training, Cryotherapy, and Moist heat  PLAN FOR NEXT SESSION: Progress HEP; pulley; finger ladder; follow protocol (in media); assess balance   Mliss Cummins, PT  11/09/23 8:31 PM   [facility] [address]  [address] [place], [place] [address] Phone # [phone] Fax [phone]

## 2023-11-10 ENCOUNTER — Encounter: Payer: Self-pay | Admitting: Physical Therapy

## 2023-11-10 ENCOUNTER — Ambulatory Visit: Admitting: Physical Therapy

## 2023-11-10 DIAGNOSIS — M6281 Muscle weakness (generalized): Secondary | ICD-10-CM | POA: Diagnosis not present

## 2023-11-10 DIAGNOSIS — R293 Abnormal posture: Secondary | ICD-10-CM | POA: Diagnosis not present

## 2023-11-10 DIAGNOSIS — M25512 Pain in left shoulder: Secondary | ICD-10-CM | POA: Diagnosis not present

## 2023-11-10 DIAGNOSIS — G8929 Other chronic pain: Secondary | ICD-10-CM | POA: Diagnosis not present

## 2023-11-10 DIAGNOSIS — M25612 Stiffness of left shoulder, not elsewhere classified: Secondary | ICD-10-CM | POA: Diagnosis not present

## 2023-11-10 DIAGNOSIS — R2681 Unsteadiness on feet: Secondary | ICD-10-CM | POA: Diagnosis not present

## 2023-11-12 DIAGNOSIS — M8589 Other specified disorders of bone density and structure, multiple sites: Secondary | ICD-10-CM | POA: Diagnosis not present

## 2023-11-12 DIAGNOSIS — Z780 Asymptomatic menopausal state: Secondary | ICD-10-CM | POA: Diagnosis not present

## 2023-11-15 ENCOUNTER — Ambulatory Visit: Admitting: Physical Therapy

## 2023-11-15 ENCOUNTER — Encounter: Payer: Self-pay | Admitting: Physical Therapy

## 2023-11-15 DIAGNOSIS — M6281 Muscle weakness (generalized): Secondary | ICD-10-CM | POA: Diagnosis not present

## 2023-11-15 DIAGNOSIS — M25612 Stiffness of left shoulder, not elsewhere classified: Secondary | ICD-10-CM | POA: Diagnosis not present

## 2023-11-15 DIAGNOSIS — M25512 Pain in left shoulder: Secondary | ICD-10-CM | POA: Diagnosis not present

## 2023-11-15 DIAGNOSIS — R2681 Unsteadiness on feet: Secondary | ICD-10-CM | POA: Diagnosis not present

## 2023-11-15 DIAGNOSIS — G8929 Other chronic pain: Secondary | ICD-10-CM | POA: Diagnosis not present

## 2023-11-15 DIAGNOSIS — R293 Abnormal posture: Secondary | ICD-10-CM

## 2023-11-15 NOTE — Therapy (Signed)
 OUTPATIENT PHYSICAL THERAPY UPPER EXTREMITY TREATMENT NOTE   Patient Name: Michelle Johnston MRN: [id number] DOB:1950-05-10, 73 y.o., female Today's Date: 11/15/2023  END OF SESSION:  PT End of Session - 11/15/23 1104     Visit Number 6    Number of Visits 16    Date for PT Re-Evaluation 12/20/23    Authorization Type 16 visits 10/25/23 to 12/20/23    Authorization - Number of Visits 16    Progress Note Due on Visit 10    PT Start Time 1101    Activity Tolerance Patient tolerated treatment well    Behavior During Therapy [facility] for tasks assessed/performed              Past Medical History:  Diagnosis Date   Anxiety    Arthritis    Atrial fibrillation (HCC)    Bradycardia    Bronchitis    Depression    Dyspnea    GERD (gastroesophageal reflux disease)    Glaucoma    Hypertension    Presence of Watchman left atrial appendage closure device 08/07/2021   Watchman FLX 27mm with Dr. Cindie   Seasonal allergies    Sleep apnea    does not use CPAP   Wears glasses    Past Surgical History:  Procedure Laterality Date   ABDOMINAL HYSTERECTOMY  1990   ATRIAL FIBRILLATION ABLATION N/A 08/12/2020   Procedure: ATRIAL FIBRILLATION ABLATION;  Surgeon: Cindie Ole DASEN, MD;  Location: [facility];  Service: Cardiovascular;  Laterality: N/A;   ATRIAL FIBRILLATION ABLATION N/A 03/26/2023   Procedure: ATRIAL FIBRILLATION ABLATION;  Surgeon: Cindie Ole DASEN, MD;  Location: [facility];  Service: Cardiovascular;  Laterality: N/A;   BREAST BIOPSY Right    benign   CARDIOVERSION N/A 08/15/2019   Procedure: CARDIOVERSION;  Surgeon: Elmira Newman PARAS, MD;  Location: [facility];  Service: Cardiovascular;  Laterality: N/A;   COLONOSCOPY     LEFT ATRIAL APPENDAGE OCCLUSION N/A 08/07/2021   Procedure: LEFT ATRIAL APPENDAGE OCCLUSION;  Surgeon: Cindie Ole DASEN, MD;  Location: [facility];  Service: Cardiovascular;  Laterality: N/A;   meniscus tear knee     left knee    NASAL SINUS SURGERY  1975   ORIF ANKLE FRACTURE  2009   left   ORIF HUMERUS FRACTURE Right 07/15/2023   Procedure: OPEN REDUCTION INTERNAL FIXATION (ORIF) DISTAL HUMERUS FRACTURE;  Surgeon: Cristy Bonner DASEN, MD;  Location: [facility];  Service: Orthopedics;  Laterality: Right;   REVERSE SHOULDER ARTHROPLASTY Left 09/02/2023   Procedure: ARTHROPLASTY, SHOULDER, TOTAL, REVERSE;  Surgeon: Cristy Bonner DASEN, MD;  Location: [facility];  Service: Orthopedics;  Laterality: Left;   TEE WITHOUT CARDIOVERSION N/A 08/07/2021   Procedure: TRANSESOPHAGEAL ECHOCARDIOGRAM (TEE);  Surgeon: Cindie Ole DASEN, MD;  Location: [facility];  Service: Cardiovascular;  Laterality: N/A;   TOTAL KNEE ARTHROPLASTY Left 02/03/2019   Procedure: LEFT TOTAL KNEE ARTHROPLASTY;  Surgeon: Vernetta Lonni GRADE, MD;  Location: [facility];  Service: Orthopedics;  Laterality: Left;   TURBINATE REDUCTION Bilateral 06/04/2014   Procedure: BILATERAL TURBINATE REDUCTION;  Surgeon: Daniel Moccasin, MD;  Location: [facility];  Service: ENT;  Laterality: Bilateral;   TYMPANOSTOMY TUBE PLACEMENT     left   Patient Active Problem List   Diagnosis Date Noted   Near syncope 09/11/2023   Impacted cerumen of left ear 08/15/2023   Other specified disorders of eustachian tube, left ear 02/06/2023   Central perforation of tympanic  membrane of left ear 02/06/2023   Presence of Watchman left atrial appendage closure device 08/07/2021   Atrial fibrillation (HCC) 08/07/2021   OSA (obstructive sleep apnea) 10/29/2020   Persistent atrial fibrillation (HCC) 03/27/2020   Hypercoagulable state due to persistent atrial fibrillation (HCC) 03/27/2020   PAD (peripheral artery disease) (HCC) 12/27/2019   Exertional chest pain 12/27/2019   Primary osteoarthritis of right knee 07/12/2019   Paroxysmal atrial fibrillation (HCC) 06/16/2019   Nonrheumatic aortic valve insufficiency 06/16/2019   Nonrheumatic mitral valve regurgitation 06/16/2019    Tachycardia 02/20/2019   Abnormal EKG 02/20/2019   Essential hypertension 02/20/2019   Status post total left knee replacement 02/03/2019   Unilateral primary osteoarthritis, left knee 11/30/2016   Chronic pain of left knee 11/30/2016    PCP:   Loreli Kins, MD    REFERRING PROVIDER: Cristy Bonner DASEN, MD  REFERRING DIAG: left total shoulder arthoplasty ORIF tuberosities  THERAPY DIAG:  Chronic left shoulder pain  Stiffness of left shoulder, not elsewhere classified  Muscle weakness (generalized)  Abnormal posture  Rationale for Evaluation and Treatment: Rehabilitation  ONSET DATE: Surgery 09/30/2023  SUBJECTIVE:                                                                                                                                                                                      SUBJECTIVE STATEMENT: I've been sore since my last visit.  Hand dominance: Right  PERTINENT HISTORY: Anxiety; OA; Depression; HTN; Hx left TKA  PAIN:  11/03/23 Are you having pain? Yes: NPRS scale: 3/ 10  Pain location: left shoulder Pain description: dull;achy Aggravating factors: folding clothes Relieving factors: Ice; Tylenol    PRECAUTIONS: Shoulder and Fall see protocol in media  RED FLAGS:  None   WEIGHT BEARING RESTRICTIONS: No  FALLS:  Has patient fallen in last 6 months? Yes. Number of falls 2 fall. Patient had two surgeries two weeks from each other due to falling. She fell while she was at the grocery store. She was turning and her husband said her feet did not move with her. PT to address balance   LIVING ENVIRONMENT: Lives with: lives with their spouse Lives in: House/apartment Stairs: Yes: Internal: 12 steps; on left going up Has following equipment at home: Single point cane and Walker - 2 wheeled  OCCUPATION: Retired  PLOF: Independent, Independent with basic ADLs, Independent with household mobility without device, Independent with community mobility  without device, Independent with gait, and Independent with transfers  PATIENT GOALS: use of my arm again. To get back to water  aerobics and drive  NEXT MD VISIT: September 30th   OBJECTIVE:  Note: Objective measures were completed at  Evaluation unless otherwise noted.   PATIENT SURVEYS :  QuickDASH : 52.3/100 52.3%  COGNITION: Overall cognitive status: Within functional limits for tasks assessed     SENSATION: WFL  POSTURE: Rounded shoulder & forward head Standing posture/ gait: right pelvic shift and left trunk lean  UPPER EXTREMITY ROM:   Passive ROM Right eval Left eval  Shoulder flexion  90  Shoulder extension  NT due to restrictions  Shoulder abduction  75  Shoulder adduction    Shoulder internal rotation  NT due to restrictions  Shoulder external rotation  NT due to restrictions  Elbow flexion    Elbow extension    Wrist flexion    Wrist extension    Wrist ulnar deviation    Wrist radial deviation    Wrist pronation    Wrist supination    (Blank rows = not tested)  UPPER EXTREMITY MMT: NT due to weight restrictions of 1lb   FUNCTIONAL TESTING 5STS: 17.47 sec no UE support TUG:14.32 sec  JOINT MOBILITY TESTING:  See PROM measurement above Open end feel                                                                                                                              TREATMENT DATE:  11/15/23: GRACEANN L2 3'x3' fwd/bwd PT present to discuss status and tolerance of last week's progression of therex Standing L at counter for bil shoulder flexion x8 Supine dowel flexion bil shoulders, then shoulder flexion and scaption with dowel x2lb x10 each 2lb Lt UE 90 deg circles CW and CCW in supine x10 each Lt UE ranger on wall flexion and scaption x 8 reps, min A Finger ladder to 31 x 8 Ball on the wall 2x5 each: pulsed flexion at 90 deg, circles CW and CCW, Lt UE SL Lt shoulder abd x10 A/ROM Lt scapular clocks x10 each: 3:00-9:00, 11:00-5:00 Supine ice  to Lt shoulder x8' end of session  11/10/23: UBE L1 3' fwd, 2' bwd PT present to discuss status Seated bil shoulder row and ext 2x15 PT holding band as anchor Step out flex isometric with yellow band x 10, attempted ER but too much resistance  Seated submax ER isometrics in sitting - difficult for pt Seated AA ER with cane  Supine dowel flexion and scaption with 2lb ankle weight on dowel x20 each Supine shoulder alphabet A-Z with 1# wt Supine shoulder circles cw and ccw x 20 each with 1# Lt SL scapular clocks 3:00/9:00, 11:00/5:00 - passive, AA/ROM with PT, then resisted against manual resistance by PT Lt shoulder flexion to 90 in Rt SL x10, Lt shoulder abd in Rt SL x 10     11/08/23: UBE L1 3' fwd, 2' bwd PT present to discuss status Seated bil shoulder row and ext 2x15 PT holding band as anchor Finger ladder x5, then x5 with step back and lower to side Supine dowel flexion and scaption with 2lb ankle weight on dowel  x20 each Supine shoulder alphabet A-Z Supine shoulder circles cw and ccw x 20 each Lt SL scapular clocks 3:00/9:00, 11:00/5:00 - passive, AA/ROM with PT, then resisted against manual resistance by PT Lt shoulder flexion to 90 in Rt SL x10, Lt shoulder abd in Rt SL x 10 Scar mobs and gentle STM to posterior and anterior shoulder end of session  11/03/23: UBE x 4 min (3 fwd and 1 min backward) 3 way scapular stabilization on both sides but no band when doing left Seated shoulder ER with yellow loop 2 x 10 (very little to no activation on left) Seated shoulder horizontal abduction (low range) 2 x 10 Standing shoulder extension and rows with yellow tband (PT anchoring) (standing to work on shoulder strength and balance) Standing in front of mirror for visual feedback bilateral shoulder flexion and scaption 2 x 10 each with manual feed back to avoid compensation: patient only able to do approx 45 degrees of flexion without compensation Finger ladder x 5 Supine shoulder flexion  2 x 10 bilateral Supine shoulder alphabet A-Z Supine shoulder circles cw and ccw x 20 each Side lying on right side: shoulder abduction 2 x 10 Seated with arm propped on bed at approx 75 degrees abduction, ER 2 x 10 with heavy verbal and tactile cues for correct angle   10/27/23: Shoulder pulleys seated 2' each: flexion and scaption to 90 deg Standing Lt shoulder isometric 5x5 flexion and abd - needed demo and technique cueing for proper form and to not overdo effort (was having pain) Seated scapular retraction 10x5 Supine AA/ROM dowel flexion to 90 deg Supine P/ROM Lt shoulder: flexion to 100, scaption to 100, ER to 20 deg - all painfree  Scar massage for improved mobility (anterior shoulder) Finger ladder: 5 rounds to number 25 Ice Lt shoulder x 8'   PATIENT EDUCATION: Education details: PT eval findings, anticipated POC, progress with PT, and initial HEP Person educated: Patient Education method: Explanation, Demonstration, and Handouts Education comprehension: verbalized understanding, returned demonstration, and needs further education  HOME EXERCISE PROGRAM: Access Code: [email or web address] URL: [email or web address] Date: 10/25/2023 Prepared by: Kristeen Sar  Exercises - Supine Shoulder Flexion with Dowel  - 1 x daily - 7 x weekly - 1-2 sets - 10 reps - Seated Scapular Retraction  - 1 x daily - 7 x weekly - 1 sets - 10 reps - Isometric Shoulder Flexion  - 1 x daily - 7 x weekly - 2 sets - 5 reps - 5 hold - Standing Isometric Shoulder Abduction with Doorway - Arm Bent  - 1 x daily - 7 x weekly - 2 sets - 5 reps - 5 hold - Seated Shoulder Flexion AAROM with Pulley Behind  - 1 x daily - 7 x weekly - 2 sets - 10 reps - Seated Shoulder Scaption AAROM with Pulley at Side  - 1 x daily - 7 x weekly - 2 sets - 10 reps  ASSESSMENT:  CLINICAL IMPRESSION:  Patient continues to feel more sore since we advanced her therex.  She has more available range passively with Lt shoulder  flexion than actively so we are advancing her therex challenges to build more flexion strength.  She is nearly unable to activate ER and abd on Lt shoulder.  Good demo of scapular movements today with cues and good control of scapula in SL with cues.     OBJECTIVE IMPAIRMENTS: Abnormal gait, decreased balance, decreased ROM, decreased strength, hypomobility, increased muscle spasms, impaired flexibility, impaired UE functional  use, postural dysfunction, and pain.   ACTIVITY LIMITATIONS: carrying, lifting, bending, squatting, stairs, bathing, dressing, reach over head, and hygiene/grooming  PARTICIPATION LIMITATIONS: meal prep, cleaning, laundry, interpersonal relationship, driving, shopping, and community activity  PERSONAL FACTORS: Age and 3+ comorbidities: falls risk HTN; anxiety; depression are also affecting patient's functional outcome.   REHAB POTENTIAL: Good  CLINICAL DECISION MAKING: Evolving/moderate complexity  EVALUATION COMPLEXITY: Moderate  GOALS: Goals reviewed with patient? Yes  SHORT TERM GOALS: Target date: 11/22/2023   Patient will be independent with initial HEP. Baseline:  Goal status: In Progress  2.  Patient will report > or = to 40% use of Lt UE with light home and self care tasks due to improved ROM and strength. Baseline:  Goal status: In Progress  3.  Patient will demonstrate correct seated and standing posture to reduce mechanical strain. Baseline:  Goal status: INITIAL  4.  .  Patient will demonstrate > or = to 100 degrees in Lt  shoulder P/ROM for improved reaching. Baseline:  Goal status: INITIAL   LONG TERM GOALS: Target date: 12/20/2023  Patient will demonstrate independence in advanced HEP. Baseline:  Goal status: INITIAL  2.  .  Patient will report > or = to 70% of normal use of Rt UE for ADLs due to improved functional strength and A/ROM. Baseline:  Goal status: INITIAL  3.  Patient will be able to return to return water  aerobics  program with no difficulty to establish normal exercise routine Baseline:  Goal status: INITIAL   4.  Patient will be able to wash dishes and fold clothes with < or = to 2/10 left shoulder pain to for improved functional use of extremity. Baseline:  Goal status: INITIAL  5.  Patient will demonstrate > or = to 115 degrees in Rt shoulder A/ROM for improved reaching overhead. Baseline:  Goal status: INITIAL  6.  Patient's Annitta will be 64/100 for improved functional use of Lt UE and decreased disability.  Baseline: 52.3/100 Goal status: INITIAL  PLAN: PT FREQUENCY: 1-2x/week  PT DURATION: 8 weeks  PLANNED INTERVENTIONS: [id number]- PT Re-evaluation, 97110-Therapeutic exercises, 97530- Therapeutic activity, 97112- Neuromuscular re-education, 97535- Self Care, [id number]- Manual therapy, [phone]- Gait training, [phone]- Canalith repositioning, [id number]- Aquatic Therapy, [phone]- Electrical stimulation (unattended), [phone]- Electrical stimulation (manual), [id number]- Vasopneumatic device, [id number]- Ultrasound, [id number]- Ionotophoresis 4mg /ml Dexamethasone , 79439 (1-2 muscles), [id number] (3+ muscles)- Dry Needling, Patient/Family education, Balance training, Stair training, Taping, Joint mobilization, Joint manipulation, Spinal manipulation, Spinal mobilization, Scar mobilization, Vestibular training, Cryotherapy, and Moist heat  PLAN FOR NEXT SESSION: Progress HEP; pulley; finger ladder; follow protocol (in media); assess balance   Orvil Fester, PT 11/15/23 12:45 PM    [facility] [address] [place], [place] [address] Phone # [phone] Fax [phone]

## 2023-11-17 ENCOUNTER — Ambulatory Visit: Admitting: Physical Therapy

## 2023-11-17 ENCOUNTER — Encounter: Payer: Self-pay | Admitting: Physical Therapy

## 2023-11-17 DIAGNOSIS — M25612 Stiffness of left shoulder, not elsewhere classified: Secondary | ICD-10-CM | POA: Diagnosis not present

## 2023-11-17 DIAGNOSIS — R2681 Unsteadiness on feet: Secondary | ICD-10-CM | POA: Diagnosis not present

## 2023-11-17 DIAGNOSIS — G8929 Other chronic pain: Secondary | ICD-10-CM | POA: Diagnosis not present

## 2023-11-17 DIAGNOSIS — M6281 Muscle weakness (generalized): Secondary | ICD-10-CM

## 2023-11-17 DIAGNOSIS — M25512 Pain in left shoulder: Secondary | ICD-10-CM | POA: Diagnosis not present

## 2023-11-17 DIAGNOSIS — R293 Abnormal posture: Secondary | ICD-10-CM

## 2023-11-17 NOTE — Therapy (Signed)
 OUTPATIENT PHYSICAL THERAPY UPPER EXTREMITY TREATMENT NOTE   Patient Name: Michelle Johnston MRN: [id number] DOB:1950/05/21, 73 y.o., female Today's Date: 11/17/2023  END OF SESSION:  PT End of Session - 11/17/23 1230     Visit Number 7    Number of Visits 16    Date for PT Re-Evaluation 12/20/23    Authorization Type 16 visits 10/25/23 to 12/20/23    Authorization - Visit Number 6    Authorization - Number of Visits 16    Progress Note Due on Visit 10    PT Start Time 1228    PT Stop Time 1313    PT Time Calculation (min) 45 min    Activity Tolerance Patient tolerated treatment well    Behavior During Therapy [facility] for tasks assessed/performed               Past Medical History:  Diagnosis Date   Anxiety    Arthritis    Atrial fibrillation (HCC)    Bradycardia    Bronchitis    Depression    Dyspnea    GERD (gastroesophageal reflux disease)    Glaucoma    Hypertension    Presence of Watchman left atrial appendage closure device 08/07/2021   Watchman FLX 27mm with Dr. Cindie   Seasonal allergies    Sleep apnea    does not use CPAP   Wears glasses    Past Surgical History:  Procedure Laterality Date   ABDOMINAL HYSTERECTOMY  1990   ATRIAL FIBRILLATION ABLATION N/A 08/12/2020   Procedure: ATRIAL FIBRILLATION ABLATION;  Surgeon: Cindie Ole DASEN, MD;  Location: [facility];  Service: Cardiovascular;  Laterality: N/A;   ATRIAL FIBRILLATION ABLATION N/A 03/26/2023   Procedure: ATRIAL FIBRILLATION ABLATION;  Surgeon: Cindie Ole DASEN, MD;  Location: [facility];  Service: Cardiovascular;  Laterality: N/A;   BREAST BIOPSY Right    benign   CARDIOVERSION N/A 08/15/2019   Procedure: CARDIOVERSION;  Surgeon: Elmira Newman PARAS, MD;  Location: [facility]OS[facility];  Service: Cardiovascular;  Laterality: N/A;   COLONOSCOPY     LEFT ATRIAL APPENDAGE OCCLUSION N/A 08/07/2021   Procedure: LEFT ATRIAL APPENDAGE OCCLUSION;  Surgeon: Cindie Ole DASEN, MD;  Location: [facility]  INV[facility] CV [facility];  Service: Cardiovascular;  Laterality: N/A;   meniscus tear knee     left knee   NASAL SINUS SURGERY  1975   ORIF ANKLE FRACTURE  2009   left   ORIF HUMERUS FRACTURE Right 07/15/2023   Procedure: OPEN REDUCTION INTERNAL FIXATION (ORIF) DISTAL HUMERUS FRACTURE;  Surgeon: Cristy Bonner DASEN, MD;  Location: [facility];  Service: Orthopedics;  Laterality: Right;   REVERSE SHOULDER ARTHROPLASTY Left 09/02/2023   Procedure: ARTHROPLASTY, SHOULDER, TOTAL, REVERSE;  Surgeon: Cristy Bonner DASEN, MD;  Location: [facility] OR;  Service: Orthopedics;  Laterality: Left;   TEE WITHOUT CARDIOVERSION N/A 08/07/2021   Procedure: TRANSESOPHAGEAL ECHOCARDIOGRAM (TEE);  Surgeon: Cindie Ole DASEN, MD;  Location: [facility] INVASIVE CV LAB;  Service: Cardiovascular;  Laterality: N/A;   TOTAL KNEE ARTHROPLASTY Left 02/03/2019   Procedure: LEFT TOTAL KNEE ARTHROPLASTY;  Surgeon: Vernetta Lonni GRADE, MD;  Location: [facility] ORS;  Service: Orthopedics;  Laterality: Left;   TURBINATE REDUCTION Bilateral 06/04/2014   Procedure: BILATERAL TURBINATE REDUCTION;  Surgeon: Daniel Moccasin, MD;  Location:  SURGERY CENTER;  Service: ENT;  Laterality: Bilateral;   TYMPANOSTOMY TUBE PLACEMENT     left   Patient Active Problem List   Diagnosis Date Noted   Near syncope 09/11/2023  Impacted cerumen of left ear 08/15/2023   Other specified disorders of eustachian tube, left ear 02/06/2023   Central perforation of tympanic membrane of left ear 02/06/2023   Presence of Watchman left atrial appendage closure device 08/07/2021   Atrial fibrillation (HCC) 08/07/2021   OSA (obstructive sleep apnea) 10/29/2020   Persistent atrial fibrillation (HCC) 03/27/2020   Hypercoagulable state due to persistent atrial fibrillation (HCC) 03/27/2020   PAD (peripheral artery disease) (HCC) 12/27/2019   Exertional chest pain 12/27/2019   Primary osteoarthritis of right knee 07/12/2019   Paroxysmal atrial fibrillation (HCC) 06/16/2019    Nonrheumatic aortic valve insufficiency 06/16/2019   Nonrheumatic mitral valve regurgitation 06/16/2019   Tachycardia 02/20/2019   Abnormal EKG 02/20/2019   Essential hypertension 02/20/2019   Status post total left knee replacement 02/03/2019   Unilateral primary osteoarthritis, left knee 11/30/2016   Chronic pain of left knee 11/30/2016    PCP:   Loreli Kins, MD    REFERRING PROVIDER: Cristy Bonner DASEN, MD  REFERRING DIAG: left total shoulder arthoplasty ORIF tuberosities  THERAPY DIAG:  Chronic left shoulder pain  Stiffness of left shoulder, not elsewhere classified  Muscle weakness (generalized)  Abnormal posture  Unsteadiness on feet  Rationale for Evaluation and Treatment: Rehabilitation  ONSET DATE: Surgery 09/30/2023  SUBJECTIVE:                                                                                                                                                                                      SUBJECTIVE STATEMENT: I went to my water  aerobics class for the 2nd time. Mostly walking. Hand dominance: Right  PERTINENT HISTORY: Anxiety; OA; Depression; HTN; Hx left TKA  PAIN:  11/03/23 Are you having pain? Yes: NPRS scale: 3/ 10  Pain location: left shoulder Pain description: dull;achy Aggravating factors: folding clothes Relieving factors: Ice; Tylenol    PRECAUTIONS: Shoulder and Fall see protocol in media  RED FLAGS:  None   WEIGHT BEARING RESTRICTIONS: No  FALLS:  Has patient fallen in last 6 months? Yes. Number of falls 2 fall. Patient had two surgeries two weeks from each other due to falling. She fell while she was at the grocery store. She was turning and her husband said her feet did not move with her. PT to address balance   LIVING ENVIRONMENT: Lives with: lives with their spouse Lives in: House/apartment Stairs: Yes: Internal: 12 steps; on left going up Has following equipment at home: Single point cane and Walker - 2  wheeled  OCCUPATION: Retired  PLOF: Independent, Independent with basic ADLs, Independent with household mobility without device, Independent with community mobility without device, Independent with gait, and Independent with transfers  PATIENT GOALS: use of my arm again. To get back to water  aerobics and drive  NEXT MD VISIT: September 30th   OBJECTIVE:  Note: Objective measures were completed at Evaluation unless otherwise noted.   PATIENT SURVEYS :  QuickDASH : 52.3/100 52.3%  COGNITION: Overall cognitive status: Within functional limits for tasks assessed     SENSATION: WFL  POSTURE: Rounded shoulder & forward head Standing posture/ gait: right pelvic shift and left trunk lean  UPPER EXTREMITY ROM:   Passive ROM Right eval Left eval Left 11/17/23  Shoulder flexion  90 150  Shoulder extension  NT due to restrictions   Shoulder abduction  75 85  Shoulder adduction     Shoulder internal rotation  NT due to restrictions   Shoulder external rotation  NT due to restrictions 22  Elbow flexion     Elbow extension     Wrist flexion     Wrist extension     Wrist ulnar deviation     Wrist radial deviation     Wrist pronation     Wrist supination     (Blank rows = not tested)  11/17/23 seated scaption  5 deg, flex 68 deg  UPPER EXTREMITY MMT: NT due to weight restrictions of 1lb   FUNCTIONAL TESTING 5STS: 17.47 sec no UE support TUG:14.32 sec  JOINT MOBILITY TESTING:  See PROM measurement above Open end feel                                                                                                                              TREATMENT DATE:  11/17/23: UBE L2 3'x2' fwd/bwd PT present to discuss status  Finger ladder to 31 x 8 Lt UE ranger on wall flexion and scaption x 8 reps, min A Ball on the wall 2x5 each: pulsed flexion at 90 deg, scaption roll out, circles CW and CCW, Lt UE Standing L at counter for bil shoulder flexion x8 Supine dowel flexion  bil shoulders, then shoulder flexion and scaption with dowel x2lb 2x10 each 2lb Lt UE 90 deg circles CW and CCW in supine x15 each SL Lt shoulder abd x10 A/ROM Lt scapular clocks 1# x10 each: 3:00-9:00, 11:00-5:00 Supine ER B 2x10 Prone L shoulder ext and rows to neutral 2x10 ROM measurements   11/15/23: UBE L2 3'x3' fwd/bwd PT present to discuss status and tolerance of last week's progression of therex Standing L at counter for bil shoulder flexion x8 Supine dowel flexion bil shoulders, then shoulder flexion and scaption with dowel x2lb x10 each 2lb Lt UE 90 deg circles CW and CCW in supine x10 each Lt UE ranger on wall flexion and scaption x 8 reps, min A Finger ladder to 31 x 8 Ball on the wall 2x5 each: pulsed flexion at 90 deg, circles CW and CCW, Lt UE SL Lt shoulder abd x10 A/ROM Lt scapular clocks x10 each: 3:00-9:00, 11:00-5:00 Supine ice to Lt shoulder x8' end of  session  11/10/23: UBE L1 3' fwd, 2' bwd PT present to discuss status Seated bil shoulder row and ext 2x15 PT holding band as anchor Step out flex isometric with yellow band x 10, attempted ER but too much resistance  Seated submax ER isometrics in sitting - difficult for pt Seated AA ER with cane  Supine dowel flexion and scaption with 2lb ankle weight on dowel x20 each Supine shoulder alphabet A-Z with 1# wt Supine shoulder circles cw and ccw x 20 each with 1# Lt SL scapular clocks 3:00/9:00, 11:00/5:00 - passive, AA/ROM with PT, then resisted against manual resistance by PT Lt shoulder flexion to 90 in Rt SL x10, Lt shoulder abd in Rt SL x 10  11/08/23: UBE L1 3' fwd, 2' bwd PT present to discuss status Seated bil shoulder row and ext 2x15 PT holding band as anchor Finger ladder x5, then x5 with step back and lower to side Supine dowel flexion and scaption with 2lb ankle weight on dowel x20 each Supine shoulder alphabet A-Z Supine shoulder circles cw and ccw x 20 each Lt SL scapular clocks 3:00/9:00,  11:00/5:00 - passive, AA/ROM with PT, then resisted against manual resistance by PT Lt shoulder flexion to 90 in Rt SL x10, Lt shoulder abd in Rt SL x 10 Scar mobs and gentle STM to posterior and anterior shoulder end of session   PATIENT EDUCATION: Education details: PT eval findings, anticipated POC, progress with PT, and initial HEP Person educated: Patient Education method: Explanation, Demonstration, and Handouts Education comprehension: verbalized understanding, returned demonstration, and needs further education  HOME EXERCISE PROGRAM: Access Code: [email or web address] URL: [email or web address] Date: 10/25/2023 Prepared by: Kristeen Sar  Exercises - Supine Shoulder Flexion with Dowel  - 1 x daily - 7 x weekly - 1-2 sets - 10 reps - Seated Scapular Retraction  - 1 x daily - 7 x weekly - 1 sets - 10 reps - Isometric Shoulder Flexion  - 1 x daily - 7 x weekly - 2 sets - 5 reps - 5 hold - Standing Isometric Shoulder Abduction with Doorway - Arm Bent  - 1 x daily - 7 x weekly - 2 sets - 5 reps - 5 hold - Seated Shoulder Flexion AAROM with Pulley Behind  - 1 x daily - 7 x weekly - 2 sets - 10 reps - Seated Shoulder Scaption AAROM with Pulley at Side  - 1 x daily - 7 x weekly - 2 sets - 10 reps  ASSESSMENT:  CLINICAL IMPRESSION:  Patient is progressing with L Shoulder ROM and pain control. Scaption is difficult. She was able to progress strengthening this weeks with no complaints. Added active ER and prone exercises today with no complaints. She continues to demonstrate potential for improvement and would benefit from continued skilled therapy to address impairments.     OBJECTIVE IMPAIRMENTS: Abnormal gait, decreased balance, decreased ROM, decreased strength, hypomobility, increased muscle spasms, impaired flexibility, impaired UE functional use, postural dysfunction, and pain.   ACTIVITY LIMITATIONS: carrying, lifting, bending, squatting, stairs, bathing, dressing, reach over  head, and hygiene/grooming  PARTICIPATION LIMITATIONS: meal prep, cleaning, laundry, interpersonal relationship, driving, shopping, and community activity  PERSONAL FACTORS: Age and 3+ comorbidities: falls risk HTN; anxiety; depression are also affecting patient's functional outcome.   REHAB POTENTIAL: Good  CLINICAL DECISION MAKING: Evolving/moderate complexity  EVALUATION COMPLEXITY: Moderate  GOALS: Goals reviewed with patient? Yes  SHORT TERM GOALS: Target date: 11/22/2023   Patient will be independent with initial HEP. Baseline:  Goal status: In Progress  2.  Patient will report > or = to 40% use of Lt UE with light home and self care tasks due to improved ROM and strength. Baseline:  Goal status: In Progress  3.  Patient will demonstrate correct seated and standing posture to reduce mechanical strain. Baseline:  Goal status: INITIAL  4.  .  Patient will demonstrate > or = to 100 degrees in Lt  shoulder P/ROM for improved reaching. Baseline:  Goal status: MET 150 deg 11/17/23   LONG TERM GOALS: Target date: 12/20/2023  Patient will demonstrate independence in advanced HEP. Baseline:  Goal status: INITIAL  2.  .  Patient will report > or = to 70% of normal use of Rt UE for ADLs due to improved functional strength and A/ROM. Baseline:  Goal status: INITIAL  3.  Patient will be able to return to return water  aerobics program with no difficulty to establish normal exercise routine Baseline:  Goal status: INITIAL   4.  Patient will be able to wash dishes and fold clothes with < or = to 2/10 left shoulder pain to for improved functional use of extremity. Baseline:  Goal status: INITIAL  5.  Patient will demonstrate > or = to 115 degrees in Rt shoulder A/ROM for improved reaching overhead. Baseline:  Goal status: INITIAL  6.  Patient's Annitta will be 64/100 for improved functional use of Lt UE and decreased disability.  Baseline: 52.3/100 Goal status:  INITIAL  PLAN: PT FREQUENCY: 1-2x/week  PT DURATION: 8 weeks  PLANNED INTERVENTIONS: [id number]- PT Re-evaluation, 97110-Therapeutic exercises, 97530- Therapeutic activity, 97112- Neuromuscular re-education, 97535- Self Care, [id number]- Manual therapy, [phone]- Gait training, [phone]- Canalith repositioning, [id number]- Aquatic Therapy, [phone]- Electrical stimulation (unattended), [phone]- Electrical stimulation (manual), [id number]- Vasopneumatic device, [id number]- Ultrasound, [id number]- Ionotophoresis 4mg /ml Dexamethasone , 79439 (1-2 muscles), [id number] (3+ muscles)- Dry Needling, Patient/Family education, Balance training, Stair training, Taping, Joint mobilization, Joint manipulation, Spinal manipulation, Spinal mobilization, Scar mobilization, Vestibular training, Cryotherapy, and Moist heat  PLAN FOR NEXT SESSION: Progress per protocol (in media); assess balance   Mliss Cummins, PT  11/17/23 1:17 PM  [facility] [address] [place], [place] [address] Phone # [phone] Fax [phone]

## 2023-11-22 ENCOUNTER — Encounter: Admitting: Physical Therapy

## 2023-11-22 NOTE — Therapy (Incomplete)
 OUTPATIENT PHYSICAL THERAPY UPPER EXTREMITY TREATMENT NOTE   Patient Name: Michelle Johnston MRN: [id number] DOB:04-16-1950, 73 y.o., female Today's Date: 11/22/2023  END OF SESSION:         Past Medical History:  Diagnosis Date   Anxiety    Arthritis    Atrial fibrillation (HCC)    Bradycardia    Bronchitis    Depression    Dyspnea    GERD (gastroesophageal reflux disease)    Glaucoma    Hypertension    Presence of Watchman left atrial appendage closure device 08/07/2021   Watchman FLX 27mm with Dr. Cindie   Seasonal allergies    Sleep apnea    does not use CPAP   Wears glasses    Past Surgical History:  Procedure Laterality Date   ABDOMINAL HYSTERECTOMY  1990   ATRIAL FIBRILLATION ABLATION N/A 08/12/2020   Procedure: ATRIAL FIBRILLATION ABLATION;  Surgeon: Cindie Ole DASEN, MD;  Location: [facility];  Service: Cardiovascular;  Laterality: N/A;   ATRIAL FIBRILLATION ABLATION N/A 03/26/2023   Procedure: ATRIAL FIBRILLATION ABLATION;  Surgeon: Cindie Ole DASEN, MD;  Location: [facility];  Service: Cardiovascular;  Laterality: N/A;   BREAST BIOPSY Right    benign   CARDIOVERSION N/A 08/15/2019   Procedure: CARDIOVERSION;  Surgeon: Elmira Newman PARAS, MD;  Location: [facility];  Service: Cardiovascular;  Laterality: N/A;   COLONOSCOPY     LEFT ATRIAL APPENDAGE OCCLUSION N/A 08/07/2021   Procedure: LEFT ATRIAL APPENDAGE OCCLUSION;  Surgeon: Cindie Ole DASEN, MD;  Location: [facility];  Service: Cardiovascular;  Laterality: N/A;   meniscus tear knee     left knee   NASAL SINUS SURGERY  1975   ORIF ANKLE FRACTURE  2009   left   ORIF HUMERUS FRACTURE Right 07/15/2023   Procedure: OPEN REDUCTION INTERNAL FIXATION (ORIF) DISTAL HUMERUS FRACTURE;  Surgeon: Cristy Bonner DASEN, MD;  Location: [facility];  Service: Orthopedics;  Laterality: Right;   REVERSE SHOULDER ARTHROPLASTY Left 09/02/2023   Procedure: ARTHROPLASTY, SHOULDER, TOTAL,  REVERSE;  Surgeon: Cristy Bonner DASEN, MD;  Location: [facility];  Service: Orthopedics;  Laterality: Left;   TEE WITHOUT CARDIOVERSION N/A 08/07/2021   Procedure: TRANSESOPHAGEAL ECHOCARDIOGRAM (TEE);  Surgeon: Cindie Ole DASEN, MD;  [facility]: [facility];  Service: Cardiovascular;  Laterality: N/A;   TOTAL KNEE ARTHROPLASTY Left 02/03/2019   Procedure: LEFT TOTAL KNEE ARTHROPLASTY;  Surgeon: Vernetta Lonni GRADE, MD;  Location: [facility];  Service: Orthopedics;  Laterality: Left;   TURBINATE REDUCTION Bilateral 06/04/2014   Procedure: BILATERAL TURBINATE REDUCTION;  Surgeon: Daniel Moccasin, MD;  Location: [facility];  Service: ENT;  Laterality: Bilateral;   TYMPANOSTOMY TUBE PLACEMENT     left   Patient Active Problem List   Diagnosis Date Noted   Near syncope 09/11/2023   Impacted cerumen of left ear 08/15/2023   Other specified disorders of eustachian tube, left ear 02/06/2023   Central perforation of tympanic membrane of left ear 02/06/2023   Presence of Watchman left atrial appendage closure device 08/07/2021   Atrial fibrillation (HCC) 08/07/2021   OSA (obstructive sleep apnea) 10/29/2020   Persistent atrial fibrillation (HCC) 03/27/2020   Hypercoagulable state due to persistent atrial fibrillation (HCC) 03/27/2020   PAD (peripheral artery disease) (HCC) 12/27/2019   Exertional chest pain 12/27/2019   Primary osteoarthritis of right knee 07/12/2019   Paroxysmal atrial fibrillation (HCC) 06/16/2019   Nonrheumatic aortic valve insufficiency 06/16/2019   Nonrheumatic mitral valve regurgitation  06/16/2019   Tachycardia 02/20/2019   Abnormal EKG 02/20/2019   Essential hypertension 02/20/2019   Status post total left knee replacement 02/03/2019   Unilateral primary osteoarthritis, left knee 11/30/2016   Chronic pain of left knee 11/30/2016    PCP:   Loreli Kins, MD    REFERRING PROVIDER: Cristy Bonner DASEN, MD  REFERRING DIAG: left total shoulder arthoplasty ORIF  tuberosities  THERAPY DIAG:  No diagnosis found.  Rationale for Evaluation and Treatment: Rehabilitation  ONSET DATE: Surgery 09/30/2023  SUBJECTIVE:                                                                                                                                                                                      SUBJECTIVE STATEMENT: I went to my water  aerobics class for the 2nd time. Mostly walking. Hand dominance: Right  PERTINENT HISTORY: Anxiety; OA; Depression; HTN; Hx left TKA  PAIN:  11/03/23 Are you having pain? Yes: NPRS scale: 3/ 10  Pain location: left shoulder Pain description: dull;achy Aggravating factors: folding clothes Relieving factors: Ice; Tylenol    PRECAUTIONS: Shoulder and Fall see protocol in media  RED FLAGS:  None   WEIGHT BEARING RESTRICTIONS: No  FALLS:  Has patient fallen in last 6 months? Yes. Number of falls 2 fall. Patient had two surgeries two weeks from each other due to falling. She fell while she was at the grocery store. She was turning and her husband said her feet did not move with her. PT to address balance   LIVING ENVIRONMENT: Lives with: lives with their spouse Lives in: House/apartment Stairs: Yes: Internal: 12 steps; on left going up Has following equipment at home: Single point cane and Walker - 2 wheeled  OCCUPATION: Retired  PLOF: Independent, Independent with basic ADLs, Independent with household mobility without device, Independent with community mobility without device, Independent with gait, and Independent with transfers  PATIENT GOALS: use of my arm again. To get back to water  aerobics and drive  NEXT MD VISIT: September 30th   OBJECTIVE:  Note: Objective measures were completed at Evaluation unless otherwise noted.   PATIENT SURVEYS :  QuickDASH : 52.3/100 52.3%  COGNITION: Overall cognitive status: Within functional limits for tasks assessed     SENSATION: WFL  POSTURE: Rounded  shoulder & forward head Standing posture/ gait: right pelvic shift and left trunk lean  UPPER EXTREMITY ROM:   Passive ROM Right eval Left eval Left 11/17/23  Shoulder flexion  90 150  Shoulder extension  NT due to restrictions   Shoulder abduction  75 85  Shoulder adduction     Shoulder internal rotation  NT due to restrictions  Shoulder external rotation  NT due to restrictions 22  Elbow flexion     Elbow extension     Wrist flexion     Wrist extension     Wrist ulnar deviation     Wrist radial deviation     Wrist pronation     Wrist supination     (Blank rows = not tested)  11/17/23 seated scaption  5 deg, flex 68 deg  UPPER EXTREMITY MMT: NT due to weight restrictions of 1lb   FUNCTIONAL TESTING 5STS: 17.47 sec no UE support TUG:14.32 sec  JOINT MOBILITY TESTING:  See PROM measurement above Open end feel                                                                                                                              TREATMENT DATE:  11/22/23:   11/17/23: GRACEANN L2 3'x2' fwd/bwd PT present to discuss status  Finger ladder to 31 x 8 Lt UE ranger on wall flexion and scaption x 8 reps, min A Ball on the wall 2x5 each: pulsed flexion at 90 deg, scaption roll out, circles CW and CCW, Lt UE Standing L at counter for bil shoulder flexion x8 Supine dowel flexion bil shoulders, then shoulder flexion and scaption with dowel x2lb 2x10 each 2lb Lt UE 90 deg circles CW and CCW in supine x15 each SL Lt shoulder abd x10 A/ROM Lt scapular clocks 1# x10 each: 3:00-9:00, 11:00-5:00 Supine ER B 2x10 Prone L shoulder ext and rows to neutral 2x10 ROM measurements   11/15/23: UBE L2 3'x3' fwd/bwd PT present to discuss status and tolerance of last week's progression of therex Standing L at counter for bil shoulder flexion x8 Supine dowel flexion bil shoulders, then shoulder flexion and scaption with dowel x2lb x10 each 2lb Lt UE 90 deg circles CW and CCW in supine x10  each Lt UE ranger on wall flexion and scaption x 8 reps, min A Finger ladder to 31 x 8 Ball on the wall 2x5 each: pulsed flexion at 90 deg, circles CW and CCW, Lt UE SL Lt shoulder abd x10 A/ROM Lt scapular clocks x10 each: 3:00-9:00, 11:00-5:00 Supine ice to Lt shoulder x8' end of session  PATIENT EDUCATION: Education details: PT eval findings, anticipated POC, progress with PT, and initial HEP Person educated: Patient Education method: Explanation, Demonstration, and Handouts Education comprehension: verbalized understanding, returned demonstration, and needs further education  HOME EXERCISE PROGRAM: Access Code: [email or web address] URL: [email or web address] Date: 10/25/2023 Prepared by: Kristeen Sar  Exercises - Supine Shoulder Flexion with Dowel  - 1 x daily - 7 x weekly - 1-2 sets - 10 reps - Seated Scapular Retraction  - 1 x daily - 7 x weekly - 1 sets - 10 reps - Isometric Shoulder Flexion  - 1 x daily - 7 x weekly - 2 sets - 5 reps - 5 hold - Standing Isometric Shoulder Abduction with Doorway - Arm Bent  - 1  x daily - 7 x weekly - 2 sets - 5 reps - 5 hold - Seated Shoulder Flexion AAROM with Pulley Behind  - 1 x daily - 7 x weekly - 2 sets - 10 reps - Seated Shoulder Scaption AAROM with Pulley at Side  - 1 x daily - 7 x weekly - 2 sets - 10 reps  ASSESSMENT:  CLINICAL IMPRESSION:  Patient is progressing with L Shoulder ROM and pain control. Scaption is difficult. She was able to progress strengthening this weeks with no complaints. Added active ER and prone exercises today with no complaints. She continues to demonstrate potential for improvement and would benefit from continued skilled therapy to address impairments.     OBJECTIVE IMPAIRMENTS: Abnormal gait, decreased balance, decreased ROM, decreased strength, hypomobility, increased muscle spasms, impaired flexibility, impaired UE functional use, postural dysfunction, and pain.   ACTIVITY LIMITATIONS: carrying,  lifting, bending, squatting, stairs, bathing, dressing, reach over head, and hygiene/grooming  PARTICIPATION LIMITATIONS: meal prep, cleaning, laundry, interpersonal relationship, driving, shopping, and community activity  PERSONAL FACTORS: Age and 3+ comorbidities: falls risk HTN; anxiety; depression are also affecting patient's functional outcome.   REHAB POTENTIAL: Good  CLINICAL DECISION MAKING: Evolving/moderate complexity  EVALUATION COMPLEXITY: Moderate  GOALS: Goals reviewed with patient? Yes  SHORT TERM GOALS: Target date: 11/22/2023   Patient will be independent with initial HEP. Baseline:  Goal status: In Progress  2.  Patient will report > or = to 40% use of Lt UE with light home and self care tasks due to improved ROM and strength. Baseline:  Goal status: In Progress  3.  Patient will demonstrate correct seated and standing posture to reduce mechanical strain. Baseline:  Goal status: INITIAL  4.  .  Patient will demonstrate > or = to 100 degrees in Lt  shoulder P/ROM for improved reaching. Baseline:  Goal status: MET 150 deg 11/17/23   LONG TERM GOALS: Target date: 12/20/2023  Patient will demonstrate independence in advanced HEP. Baseline:  Goal status: INITIAL  2.  .  Patient will report > or = to 70% of normal use of Rt UE for ADLs due to improved functional strength and A/ROM. Baseline:  Goal status: INITIAL  3.  Patient will be able to return to return water  aerobics program with no difficulty to establish normal exercise routine Baseline:  Goal status: INITIAL   4.  Patient will be able to wash dishes and fold clothes with < or = to 2/10 left shoulder pain to for improved functional use of extremity. Baseline:  Goal status: INITIAL  5.  Patient will demonstrate > or = to 115 degrees in Rt shoulder A/ROM for improved reaching overhead. Baseline:  Goal status: INITIAL  6.  Patient's Annitta will be 64/100 for improved functional use of Lt UE  and decreased disability.  Baseline: 52.3/100 Goal status: INITIAL  PLAN: PT FREQUENCY: 1-2x/week  PT DURATION: 8 weeks  PLANNED INTERVENTIONS: [id number]- PT Re-evaluation, 97110-Therapeutic exercises, 97530- Therapeutic activity, 97112- Neuromuscular re-education, 97535- Self Care, [id number]- Manual therapy, [phone]- Gait training, [phone]- Canalith repositioning, [id number]- Aquatic Therapy, [phone]- Electrical stimulation (unattended), [phone]- Electrical stimulation (manual), [id number]- Vasopneumatic device, [id number]- Ultrasound, [id number]- Ionotophoresis 4mg /ml Dexamethasone , 79439 (1-2 muscles), [id number] (3+ muscles)- Dry Needling, Patient/Family education, Balance training, Stair training, Taping, Joint mobilization, Joint manipulation, Spinal manipulation, Spinal mobilization, Scar mobilization, Vestibular training, Cryotherapy, and Moist heat  PLAN FOR NEXT SESSION: Progress per protocol (in media); assess balance   Mliss Cummins, PT  11/22/23 7:49 AM  [facility] [address], [address] [place], [place] [address] Phone # [phone] Fax [phone]

## 2023-11-24 ENCOUNTER — Encounter: Payer: Self-pay | Admitting: Physical Therapy

## 2023-11-24 ENCOUNTER — Ambulatory Visit: Admitting: Physical Therapy

## 2023-11-24 DIAGNOSIS — M25512 Pain in left shoulder: Secondary | ICD-10-CM | POA: Diagnosis not present

## 2023-11-24 DIAGNOSIS — G8929 Other chronic pain: Secondary | ICD-10-CM | POA: Diagnosis not present

## 2023-11-24 DIAGNOSIS — M6281 Muscle weakness (generalized): Secondary | ICD-10-CM

## 2023-11-24 DIAGNOSIS — R293 Abnormal posture: Secondary | ICD-10-CM

## 2023-11-24 DIAGNOSIS — M25612 Stiffness of left shoulder, not elsewhere classified: Secondary | ICD-10-CM | POA: Diagnosis not present

## 2023-11-24 DIAGNOSIS — R2681 Unsteadiness on feet: Secondary | ICD-10-CM | POA: Diagnosis not present

## 2023-11-24 NOTE — Therapy (Signed)
 OUTPATIENT PHYSICAL THERAPY UPPER EXTREMITY TREATMENT NOTE   Patient Name: Michelle Johnston MRN: [id number] DOB:1950-10-26, 73 y.o., female Today's Date: 11/24/2023  END OF SESSION:  PT End of Session - 11/24/23 1151     Visit Number 8    Number of Visits 16    Date for Recertification  12/20/23    Authorization Type 16 visits 10/25/23 to 12/20/23    Authorization - Visit Number 7    Authorization - Number of Visits 16    Progress Note Due on Visit 10    PT Start Time 1150    PT Stop Time 1245    PT Time Calculation (min) 55 min    Activity Tolerance Patient tolerated treatment well    Behavior During Therapy [facility] for tasks assessed/performed                Past Medical History:  Diagnosis Date   Anxiety    Arthritis    Atrial fibrillation (HCC)    Bradycardia    Bronchitis    Depression    Dyspnea    GERD (gastroesophageal reflux disease)    Glaucoma    Hypertension    Presence of Watchman left atrial appendage closure device 08/07/2021   Watchman FLX 27mm with Dr. Cindie   Seasonal allergies    Sleep apnea    does not use CPAP   Wears glasses    Past Surgical History:  Procedure Laterality Date   ABDOMINAL HYSTERECTOMY  1990   ATRIAL FIBRILLATION ABLATION N/A 08/12/2020   Procedure: ATRIAL FIBRILLATION ABLATION;  Surgeon: Cindie Ole DASEN, MD;  Location: [facility];  Service: Cardiovascular;  Laterality: N/A;   ATRIAL FIBRILLATION ABLATION N/A 03/26/2023   Procedure: ATRIAL FIBRILLATION ABLATION;  Surgeon: Cindie Ole DASEN, MD;  Location: [facility];  Service: Cardiovascular;  Laterality: N/A;   BREAST BIOPSY Right    benign   CARDIOVERSION N/A 08/15/2019   Procedure: CARDIOVERSION;  Surgeon: Elmira Newman PARAS, MD;  Location: [facility];  Service: Cardiovascular;  Laterality: N/A;   COLONOSCOPY     LEFT ATRIAL APPENDAGE OCCLUSION N/A 08/07/2021   Procedure: LEFT ATRIAL APPENDAGE OCCLUSION;  Surgeon: Cindie Ole DASEN, MD;  Location: [facility];  Service: Cardiovascular;  Laterality: N/A;   meniscus tear knee     left knee   NASAL SINUS SURGERY  1975   ORIF ANKLE FRACTURE  2009   left   ORIF HUMERUS FRACTURE Right 07/15/2023   Procedure: OPEN REDUCTION INTERNAL FIXATION (ORIF) DISTAL HUMERUS FRACTURE;  Surgeon: Cristy Bonner DASEN, MD;  Location: [facility];  Service: Orthopedics;  Laterality: Right;   REVERSE SHOULDER ARTHROPLASTY Left 09/02/2023   Procedure: ARTHROPLASTY, SHOULDER, TOTAL, REVERSE;  Surgeon: Cristy Bonner DASEN, MD;  Location: [facility] OR;  Service: Orthopedics;  Laterality: Left;   TEE WITHOUT CARDIOVERSION N/A 08/07/2021   Procedure: TRANSESOPHAGEAL ECHOCARDIOGRAM (TEE);  Surgeon: Cindie Ole DASEN, MD;  [facility]: [facility] INV[facility]IVE CV [facility];  Service: Cardiovascular;  Laterality: N/A;   TOTAL KNEE ARTHROPLASTY Left 02/03/2019   Procedure: LEFT TOTAL KNEE ARTHROPLASTY;  Surgeon: Vernetta Lonni GRADE, MD;  Location: [facility]S;  Service: Orthopedics;  Laterality: Left;   TURBINATE REDUCTION Bilateral 06/04/2014   Procedure: BILATERAL TURBINATE REDUCTION;  Surgeon: Daniel Moccasin, MD;  Location:  SURGERY CENTER;  Service: ENT;  Laterality: Bilateral;   TYMPANOSTOMY TUBE PLACEMENT     left   Patient Active Problem List   Diagnosis Date Noted   Near syncope  09/11/2023   Impacted cerumen of left ear 08/15/2023   Other specified disorders of eustachian tube, left ear 02/06/2023   Central perforation of tympanic membrane of left ear 02/06/2023   Presence of Watchman left atrial appendage closure device 08/07/2021   Atrial fibrillation (HCC) 08/07/2021   OSA (obstructive sleep apnea) 10/29/2020   Persistent atrial fibrillation (HCC) 03/27/2020   Hypercoagulable state due to persistent atrial fibrillation (HCC) 03/27/2020   PAD (peripheral artery disease) 12/27/2019   Exertional chest pain 12/27/2019   Primary osteoarthritis of right knee 07/12/2019   Paroxysmal atrial fibrillation (HCC) 06/16/2019   Nonrheumatic  aortic valve insufficiency 06/16/2019   Nonrheumatic mitral valve regurgitation 06/16/2019   Tachycardia 02/20/2019   Abnormal EKG 02/20/2019   Essential hypertension 02/20/2019   Status post total left knee replacement 02/03/2019   Unilateral primary osteoarthritis, left knee 11/30/2016   Chronic pain of left knee 11/30/2016    PCP:   Loreli Kins, MD    REFERRING PROVIDER: Cristy Bonner DASEN, MD  REFERRING DIAG: left total shoulder arthoplasty ORIF tuberosities  THERAPY DIAG:  Chronic left shoulder pain  Stiffness of left shoulder, not elsewhere classified  Muscle weakness (generalized)  Abnormal posture  Rationale for Evaluation and Treatment: Rehabilitation  ONSET DATE: Surgery 09/02/2023  SUBJECTIVE:                                                                                                                                                                                      SUBJECTIVE STATEMENT: It keeps getting better. Patien is12 weeks post op on 9/25.  Hand dominance: Right  PERTINENT HISTORY: Anxiety; OA; Depression; HTN; Hx left TKA  PAIN:  11/03/23 Are you having pain? Yes: NPRS scale: 3/ 10  Pain location: left shoulder Pain description: dull;achy Aggravating factors: folding clothes Relieving factors: Ice; Tylenol    PRECAUTIONS: Shoulder and Fall see protocol in media  RED FLAGS:  None   WEIGHT BEARING RESTRICTIONS: No  FALLS:  Has patient fallen in last 6 months? Yes. Number of falls 2 fall. Patient had two surgeries two weeks from each other due to falling. She fell while she was at the grocery store. She was turning and her husband said her feet did not move with her. PT to address balance   LIVING ENVIRONMENT: Lives with: lives with their spouse Lives in: House/apartment Stairs: Yes: Internal: 12 steps; on left going up Has following equipment at home: Single point cane and Walker - 2 wheeled  OCCUPATION: Retired  PLOF: Independent,  Independent with basic ADLs, Independent with household mobility without device, Independent with community mobility without device, Independent with gait, and Independent with transfers  PATIENT GOALS:  use of my arm again. To get back to water  aerobics and drive  NEXT MD VISIT: September 30th   OBJECTIVE:  Note: Objective measures were completed at Evaluation unless otherwise noted.   PATIENT SURVEYS :  QuickDASH : 52.3/100 52.3%  COGNITION: Overall cognitive status: Within functional limits for tasks assessed     SENSATION: WFL  POSTURE: Rounded shoulder & forward head Standing posture/ gait: right pelvic shift and left trunk lean  UPPER EXTREMITY ROM:   Passive ROM Right eval Left eval Left 11/17/23  Shoulder flexion  90 150  Shoulder extension  NT due to restrictions   Shoulder abduction  75 85  Shoulder adduction     Shoulder internal rotation  NT due to restrictions   Shoulder external rotation  NT due to restrictions 22  Elbow flexion     Elbow extension     Wrist flexion     Wrist extension     Wrist ulnar deviation     Wrist radial deviation     Wrist pronation     Wrist supination     (Blank rows = not tested)  11/17/23 seated scaption  5 deg, flex 68 deg  UPPER EXTREMITY MMT: NT due to weight restrictions of 1lb   FUNCTIONAL TESTING 5STS: 17.47 sec no UE support TUG:14.32 sec  JOINT MOBILITY TESTING:  See PROM measurement above Open end feel                                                                                                                              TREATMENT DATE:  11/24/23: UBE L2 3'x3' fwd/bwd PT present to discuss status  Ball on the wall 2x5 each: scaption roll out, circles CW and CCW, Lt UE; also did flexion and scaption roll outs on red plyoball on elevated mat Worked on scapular strengthening in standing for decreased levator activation with flex and scaption; standing at counter with B hands on counter, elbows straight  working on scapular retraction and low trap engagement; cues for chest up; thoracic spine mobility Standing active flexion and scaption with mirror  Standing and seated flexion with hands clasped x 10 ea Seated ER AROM x 10 Supine dowel flexion bil shoulders, then shoulder flexion and scaption with dowel x2lb 2x10 each 2lb Lt UE 90 deg circles CW and CCW in supine x15 each S/L Lt shoulder abd x15 A/ROM Lt scapular clocks 1# x10 each: 3:00-9:00, 11:00-5:00 cues for correct form Supine ER B 2x10 cue for correct form Prone L shoulder ext and rows 1# to neutral 2x10 Ice to L shoulder at end of session x 10 min  11/17/23: UBE L2 3'x2' fwd/bwd PT present to discuss status  Finger ladder to 31 x 8 Lt UE ranger on wall flexion and scaption x 8 reps, min A Ball on the wall 2x5 each: pulsed flexion at 90 deg, scaption roll out, circles CW and CCW, Lt UE Standing L at counter for  bil shoulder flexion x8 Supine dowel flexion bil shoulders, then shoulder flexion and scaption with dowel x2lb 2x10 each 2lb Lt UE 90 deg circles CW and CCW in supine x15 each SL Lt shoulder abd x10 A/ROM Lt scapular clocks 1# x10 each: 3:00-9:00, 11:00-5:00 Supine ER B 2x10 Prone L shoulder ext and rows to neutral 2x10 ROM measurements   11/15/23: UBE L2 3'x3' fwd/bwd PT present to discuss status and tolerance of last week's progression of therex Standing L at counter for bil shoulder flexion x8 Supine dowel flexion bil shoulders, then shoulder flexion and scaption with dowel x2lb x10 each 2lb Lt UE 90 deg circles CW and CCW in supine x10 each Lt UE ranger on wall flexion and scaption x 8 reps, min A Finger ladder to 31 x 8 Ball on the wall 2x5 each: pulsed flexion at 90 deg, circles CW and CCW, Lt UE SL Lt shoulder abd x10 A/ROM Lt scapular clocks x10 each: 3:00-9:00, 11:00-5:00 Supine ice to Lt shoulder x8' end of session  PATIENT EDUCATION: Education details: PT eval findings, anticipated POC, progress with  PT, and initial HEP Person educated: Patient Education method: Explanation, Demonstration, and Handouts Education comprehension: verbalized understanding, returned demonstration, and needs further education  HOME EXERCISE PROGRAM: Access Code: [email or web address] URL: [email or web address] Date: 10/25/2023 Prepared by: Kristeen Sar  Exercises - Supine Shoulder Flexion with Dowel  - 1 x daily - 7 x weekly - 1-2 sets - 10 reps - Seated Scapular Retraction  - 1 x daily - 7 x weekly - 1 sets - 10 reps - Isometric Shoulder Flexion  - 1 x daily - 7 x weekly - 2 sets - 5 reps - 5 hold - Standing Isometric Shoulder Abduction with Doorway - Arm Bent  - 1 x daily - 7 x weekly - 2 sets - 5 reps - 5 hold - Seated Shoulder Flexion AAROM with Pulley Behind  - 1 x daily - 7 x weekly - 2 sets - 10 reps - Seated Shoulder Scaption AAROM with Pulley at Side  - 1 x daily - 7 x weekly - 2 sets - 10 reps  ASSESSMENT:  CLINICAL IMPRESSION: Flaming continues to demonstrate L shoulder elevation with flexion and scaption, so we focused on scapular strengthening. She had difficulty isolating the scapula at first, but eventually demonstrated could retraction and depression. This carried over well with active flexion, but not with scaption. We used the mirror for visual feedback as well. She still needs cues for many of the exercises.   OBJECTIVE IMPAIRMENTS: Abnormal gait, decreased balance, decreased ROM, decreased strength, hypomobility, increased muscle spasms, impaired flexibility, impaired UE functional use, postural dysfunction, and pain.   ACTIVITY LIMITATIONS: carrying, lifting, bending, squatting, stairs, bathing, dressing, reach over head, and hygiene/grooming  PARTICIPATION LIMITATIONS: meal prep, cleaning, laundry, interpersonal relationship, driving, shopping, and community activity  PERSONAL FACTORS: Age and 3+ comorbidities: falls risk HTN; anxiety; depression are also affecting patient's functional  outcome.   REHAB POTENTIAL: Good  CLINICAL DECISION MAKING: Evolving/moderate complexity  EVALUATION COMPLEXITY: Moderate  GOALS: Goals reviewed with patient? Yes  SHORT TERM GOALS: Target date: 11/22/2023   Patient will be independent with initial HEP. Baseline:  Goal status: In Progress  2.  Patient will report > or = to 40% use of Lt UE with light home and self care tasks due to improved ROM and strength. Baseline:  Goal status: In Progress  3.  Patient will demonstrate correct seated and standing posture to reduce mechanical strain.  Baseline:  Goal status: INITIAL  4.  .  Patient will demonstrate > or = to 100 degrees in Lt  shoulder P/ROM for improved reaching. Baseline:  Goal status: MET 150 deg 11/17/23   LONG TERM GOALS: Target date: 12/20/2023  Patient will demonstrate independence in advanced HEP. Baseline:  Goal status: INITIAL  2.  .  Patient will report > or = to 70% of normal use of Rt UE for ADLs due to improved functional strength and A/ROM. Baseline:  Goal status: INITIAL  3.  Patient will be able to return to return water  aerobics program with no difficulty to establish normal exercise routine Baseline:  Goal status: INITIAL   4.  Patient will be able to wash dishes and fold clothes with < or = to 2/10 left shoulder pain to for improved functional use of extremity. Baseline:  Goal status: INITIAL  5.  Patient will demonstrate > or = to 115 degrees in Rt shoulder A/ROM for improved reaching overhead. Baseline:  Goal status: INITIAL  6.  Patient's Annitta will be 64/100 for improved functional use of Lt UE and decreased disability.  Baseline: 52.3/100 Goal status: INITIAL  PLAN: PT FREQUENCY: 1-2x/week  PT DURATION: 8 weeks  PLANNED INTERVENTIONS: 97164- PT Re-evaluation, 97110-Therapeutic exercises, 97530- Therapeutic activity, 97112- Neuromuscular re-education, 97535- Self Care, 02859- Manual therapy, [phone]- Gait training, [phone]-  Canalith repositioning, [id number]- Aquatic Therapy, [phone]- Electrical stimulation (unattended), [phone]- Electrical stimulation (manual), [id number]- Vasopneumatic device, [id number]- Ultrasound, [id number]- Ionotophoresis 4mg /ml Dexamethasone , 79439 (1-2 muscles), [id number] (3+ muscles)- Dry Needling, Patient/Family education, Balance training, Stair training, Taping, Joint mobilization, Joint manipulation, Spinal manipulation, Spinal mobilization, Scar mobilization, Vestibular training, Cryotherapy, and Moist heat  PLAN FOR NEXT SESSION: Progress per protocol (in media); assess balance   Mliss Cummins, PT  11/24/23 1:37 PM  [facility] [address] [place], [place] [address] Phone # [phone] Fax [phone]

## 2023-11-25 DIAGNOSIS — M5416 Radiculopathy, lumbar region: Secondary | ICD-10-CM | POA: Diagnosis not present

## 2023-11-29 ENCOUNTER — Ambulatory Visit: Admitting: Physical Therapy

## 2023-11-29 ENCOUNTER — Encounter: Payer: Self-pay | Admitting: Physical Therapy

## 2023-11-29 DIAGNOSIS — M25512 Pain in left shoulder: Secondary | ICD-10-CM | POA: Diagnosis not present

## 2023-11-29 DIAGNOSIS — G8929 Other chronic pain: Secondary | ICD-10-CM | POA: Diagnosis not present

## 2023-11-29 DIAGNOSIS — M6281 Muscle weakness (generalized): Secondary | ICD-10-CM | POA: Diagnosis not present

## 2023-11-29 DIAGNOSIS — M25612 Stiffness of left shoulder, not elsewhere classified: Secondary | ICD-10-CM

## 2023-11-29 DIAGNOSIS — R293 Abnormal posture: Secondary | ICD-10-CM

## 2023-11-29 DIAGNOSIS — R2681 Unsteadiness on feet: Secondary | ICD-10-CM | POA: Diagnosis not present

## 2023-11-29 NOTE — Therapy (Signed)
 OUTPATIENT PHYSICAL THERAPY UPPER EXTREMITY TREATMENT NOTE   Patient Name: Michelle Johnston MRN: [id number] DOB:[birth year], 73 y.o., female Today's Date: 11/29/2023  END OF SESSION:  PT End of Session - 11/29/23 1402     Visit Number 9    Number of Visits 16    Date for Recertification  12/20/23    Authorization Type 16 visits 10/25/23 to 12/20/23    Authorization - Visit Number 8    Authorization - Number of Visits 16    Progress Note Due on Visit 10    PT Start Time 1400    PT Stop Time 1440    PT Time Calculation (min) 40 min    Activity Tolerance Patient tolerated treatment well    Behavior During Therapy [facility] for tasks assessed/performed                 Past Medical History:  Diagnosis Date   Anxiety    Arthritis    Atrial fibrillation (HCC)    Bradycardia    Bronchitis    Depression    Dyspnea    GERD (gastroesophageal reflux disease)    Glaucoma    Hypertension    Presence of Watchman left atrial appendage closure device 08/07/2021   Watchman FLX 27mm with Dr. Cindie   Seasonal allergies    Sleep apnea    does not use CPAP   Wears glasses    Past Surgical History:  Procedure Laterality Date   ABDOMINAL HYSTERECTOMY  1990   ATRIAL FIBRILLATION ABLATION N/A 08/12/2020   Procedure: ATRIAL FIBRILLATION ABLATION;  Surgeon: Cindie Ole DASEN, MD;  Location: [facility];  Service: Cardiovascular;  Laterality: N/A;   ATRIAL FIBRILLATION ABLATION N/A 03/26/2023   Procedure: ATRIAL FIBRILLATION ABLATION;  Surgeon: Cindie Ole DASEN, MD;  Location: [facility];  Service: Cardiovascular;  Laterality: N/A;   BREAST BIOPSY Right    benign   CARDIOVERSION N/A 08/15/2019   Procedure: CARDIOVERSION;  Surgeon: Elmira Newman PARAS, MD;  Location: [facility];  Service: Cardiovascular;  Laterality: N/A;   COLONOSCOPY     LEFT ATRIAL APPENDAGE OCCLUSION N/A 08/07/2021   Procedure: LEFT ATRIAL APPENDAGE OCCLUSION;  Surgeon: Cindie Ole DASEN, MD;  Location:  [facility];  Service: Cardiovascular;  Laterality: N/A;   meniscus tear knee     left knee   NASAL SINUS SURGERY  1975   ORIF ANKLE FRACTURE  2009   left   ORIF HUMERUS FRACTURE Right 07/15/2023   Procedure: OPEN REDUCTION INTERNAL FIXATION (ORIF) DISTAL HUMERUS FRACTURE;  Surgeon: Cristy Bonner DASEN, MD;  Location: [facility];  Service: Orthopedics;  Laterality: Right;   REVERSE SHOULDER ARTHROPLASTY Left 09/02/2023   Procedure: ARTHROPLASTY, SHOULDER, TOTAL, REVERSE;  Surgeon: Cristy Bonner DASEN, MD;  Location: [facility];  Service: Orthopedics;  Laterality: Left;   TEE WITHOUT CARDIOVERSION N/A 08/07/2021   Procedure: TRANSESOPHAGEAL ECHOCARDIOGRAM (TEE);  Surgeon: Cindie Ole DASEN, MD;  Location: [facility];  Service: Cardiovascular;  Laterality: N/A;   TOTAL KNEE ARTHROPLASTY Left 02/03/2019   Procedure: LEFT TOTAL KNEE ARTHROPLASTY;  Surgeon: Vernetta Lonni GRADE, MD;  Location: [facility];  Service: Orthopedics;  Laterality: Left;   TURBINATE REDUCTION Bilateral 06/04/2014   Procedure: BILATERAL TURBINATE REDUCTION;  Surgeon: Daniel Moccasin, MD;  Location: [facility];  Service: ENT;  Laterality: Bilateral;   TYMPANOSTOMY TUBE PLACEMENT     left   Patient Active Problem List   Diagnosis Date Noted   Near  syncope 09/11/2023   Impacted cerumen of left ear 08/15/2023   Other specified disorders of eustachian tube, left ear 02/06/2023   Central perforation of tympanic membrane of left ear 02/06/2023   Presence of Watchman left atrial appendage closure device 08/07/2021   Atrial fibrillation (HCC) 08/07/2021   OSA (obstructive sleep apnea) 10/29/2020   Persistent atrial fibrillation (HCC) 03/27/2020   Hypercoagulable state due to persistent atrial fibrillation (HCC) 03/27/2020   PAD (peripheral artery disease) 12/27/2019   Exertional chest pain 12/27/2019   Primary osteoarthritis of right knee 07/12/2019   Paroxysmal atrial fibrillation (HCC) 06/16/2019    Nonrheumatic aortic valve insufficiency 06/16/2019   Nonrheumatic mitral valve regurgitation 06/16/2019   Tachycardia 02/20/2019   Abnormal EKG 02/20/2019   Essential hypertension 02/20/2019   Status post total left knee replacement 02/03/2019   Unilateral primary osteoarthritis, left knee 11/30/2016   Chronic pain of left knee 11/30/2016    PCP:   Loreli Kins, MD    REFERRING PROVIDER: Cristy Bonner DASEN, MD  REFERRING DIAG: left total shoulder arthoplasty ORIF tuberosities  THERAPY DIAG:  Chronic left shoulder pain  Stiffness of left shoulder, not elsewhere classified  Muscle weakness (generalized)  Abnormal posture  Rationale for Evaluation and Treatment: Rehabilitation  ONSET DATE: Surgery 09/02/2023  SUBJECTIVE:                                                                                                                                                                                      SUBJECTIVE STATEMENT: I am using my Lt arm more and more as my shoulder feels better.  Hand dominance: Right  PERTINENT HISTORY: Anxiety; OA; Depression; HTN; Hx left TKA  PAIN:   Are you having pain? Yes: NPRS scale: 2/ 10  Pain location: left shoulder Pain description: dull;achy Aggravating factors: folding clothes Relieving factors: Ice; Tylenol    PRECAUTIONS: Shoulder and Fall see protocol in media  RED FLAGS:  None   WEIGHT BEARING RESTRICTIONS: No  FALLS:  Has patient fallen in last 6 months? Yes. Number of falls 2 fall. Patient had two surgeries two weeks from each other due to falling. She fell while she was at the grocery store. She was turning and her husband said her feet did not move with her. PT to address balance   LIVING ENVIRONMENT: Lives with: lives with their spouse Lives in: House/apartment Stairs: Yes: Internal: 12 steps; on left going up Has following equipment at home: Single point cane and Walker - 2 wheeled  OCCUPATION: Retired  PLOF:  Independent, Independent with basic ADLs, Independent with household mobility without device, Independent with community mobility without device, Independent with gait, and Independent with  transfers  PATIENT GOALS: use of my arm again. To get back to water  aerobics and drive  NEXT MD VISIT: September 30th   OBJECTIVE:  Note: Objective measures were completed at Evaluation unless otherwise noted.   PATIENT SURVEYS :  QuickDASH : 52.3/100 52.3%  COGNITION: Overall cognitive status: Within functional limits for tasks assessed     SENSATION: WFL  POSTURE: Rounded shoulder & forward head Standing posture/ gait: right pelvic shift and left trunk lean  UPPER EXTREMITY ROM:   Passive ROM Right eval Left eval Left 11/17/23  Shoulder flexion  90 150  Shoulder extension  NT due to restrictions   Shoulder abduction  75 85  Shoulder adduction     Shoulder internal rotation  NT due to restrictions   Shoulder external rotation  NT due to restrictions 22  Elbow flexion     Elbow extension     Wrist flexion     Wrist extension     Wrist ulnar deviation     Wrist radial deviation     Wrist pronation     Wrist supination     (Blank rows = not tested)  11/17/23 seated scaption  5 deg, flex 68 deg  UPPER EXTREMITY MMT: NT due to weight restrictions of 1lb   FUNCTIONAL TESTING 5STS: 17.47 sec no UE support TUG:14.32 sec  JOINT MOBILITY TESTING:  See PROM measurement above Open end feel                                                                                                                              TREATMENT DATE:  11/29/23: UBE L2 3x3 fwd/bwd PT present to discuss status Standing L at counter for shoulder flexion stretch Standing Lt AROM flexion to shoulder height shelf focused on scapular mechanics Ranger flexion Lt UE full range 2x10 Finger ladder with focus on scapular mechanics Supine flexion 1x10 1lb, 1x10 no weight Supine circles Lt UE CW and CCW x10 each  way 1lb SL Lt shoulder abd 2x10, second set added rhythmic stabilization at 90 deg abd Standing row green tband x20 Lt UE wall slides to 100 deg, step back and lower (very challenging) Ball on the wall standing x10 each way  11/24/23: UBE L2 3'x3' fwd/bwd PT present to discuss status  Ball on the wall 2x5 each: scaption roll out, circles CW and CCW, Lt UE; also did flexion and scaption roll outs on red plyoball on elevated mat Worked on scapular strengthening in standing for decreased levator activation with flex and scaption; standing at counter with B hands on counter, elbows straight working on scapular retraction and low trap engagement; cues for chest up; thoracic spine mobility Standing active flexion and scaption with mirror  Standing and seated flexion with hands clasped x 10 ea Seated ER AROM x 10 Supine dowel flexion bil shoulders, then shoulder flexion and scaption with dowel x2lb 2x10 each 2lb Lt UE 90 deg circles CW and CCW  in supine x15 each S/L Lt shoulder abd x15 A/ROM Lt scapular clocks 1# x10 each: 3:00-9:00, 11:00-5:00 cues for correct form Supine ER B 2x10 cue for correct form Prone L shoulder ext and rows 1# to neutral 2x10 Ice to L shoulder at end of session x 10 min  11/17/23: UBE L2 3'x2' fwd/bwd PT present to discuss status  Finger ladder to 31 x 8 Lt UE ranger on wall flexion and scaption x 8 reps, min A Ball on the wall 2x5 each: pulsed flexion at 90 deg, scaption roll out, circles CW and CCW, Lt UE Standing L at counter for bil shoulder flexion x8 Supine dowel flexion bil shoulders, then shoulder flexion and scaption with dowel x2lb 2x10 each 2lb Lt UE 90 deg circles CW and CCW in supine x15 each SL Lt shoulder abd x10 A/ROM Lt scapular clocks 1# x10 each: 3:00-9:00, 11:00-5:00 Supine ER B 2x10 Prone L shoulder ext and rows to neutral 2x10 ROM measurements  PATIENT EDUCATION: Education details: PT eval findings, anticipated POC, progress with PT, and  initial HEP Person educated: Patient Education method: Explanation, Demonstration, and Handouts Education comprehension: verbalized understanding, returned demonstration, and needs further education  HOME EXERCISE PROGRAM: Access Code: [email or web address] URL: [email or web address] Date: 10/25/2023 Prepared by: Kristeen Sar  Exercises - Supine Shoulder Flexion with Dowel  - 1 x daily - 7 x weekly - 1-2 sets - 10 reps - Seated Scapular Retraction  - 1 x daily - 7 x weekly - 1 sets - 10 reps - Isometric Shoulder Flexion  - 1 x daily - 7 x weekly - 2 sets - 5 reps - 5 hold - Standing Isometric Shoulder Abduction with Doorway - Arm Bent  - 1 x daily - 7 x weekly - 2 sets - 5 reps - 5 hold - Seated Shoulder Flexion AAROM with Pulley Behind  - 1 x daily - 7 x weekly - 2 sets - 10 reps - Seated Shoulder Scaption AAROM with Pulley at Side  - 1 x daily - 7 x weekly - 2 sets - 10 reps  ASSESSMENT:  CLINICAL IMPRESSION: Lyly continues to demonstrate L shoulder elevation with flexion and scaption, so we continue to focus on scapular strengthening. She has very little activation of shoulder abd and ER due to history of tears so she has imbalance with her efforts to stabilize GH joint.  She reports she is doing her water  therapy classes and works the arm there and is using the Lt UE more and more as the pain reduces.     OBJECTIVE IMPAIRMENTS: Abnormal gait, decreased balance, decreased ROM, decreased strength, hypomobility, increased muscle spasms, impaired flexibility, impaired UE functional use, postural dysfunction, and pain.   ACTIVITY LIMITATIONS: carrying, lifting, bending, squatting, stairs, bathing, dressing, reach over head, and hygiene/grooming  PARTICIPATION LIMITATIONS: meal prep, cleaning, laundry, interpersonal relationship, driving, shopping, and community activity  PERSONAL FACTORS: Age and 3+ comorbidities: falls risk HTN; anxiety; depression are also affecting patient's  functional outcome.   REHAB POTENTIAL: Good  CLINICAL DECISION MAKING: Evolving/moderate complexity  EVALUATION COMPLEXITY: Moderate  GOALS: Goals reviewed with patient? Yes  SHORT TERM GOALS: Target date: 11/22/2023   Patient will be independent with initial HEP. Baseline:  Goal status: In Progress  2.  Patient will report > or = to 40% use of Lt UE with light home and self care tasks due to improved ROM and strength. Baseline:  Goal status: In Progress  3.  Patient will demonstrate correct seated and  standing posture to reduce mechanical strain. Baseline:  Goal status: INITIAL  4.  .  Patient will demonstrate > or = to 100 degrees in Lt  shoulder P/ROM for improved reaching. Baseline:  Goal status: MET 150 deg 11/17/23   LONG TERM GOALS: Target date: 12/20/2023  Patient will demonstrate independence in advanced HEP. Baseline:  Goal status: INITIAL  2.  .  Patient will report > or = to 70% of normal use of Rt UE for ADLs due to improved functional strength and A/ROM. Baseline:  Goal status: INITIAL  3.  Patient will be able to return to return water  aerobics program with no difficulty to establish normal exercise routine Baseline:  Goal status: INITIAL   4.  Patient will be able to wash dishes and fold clothes with < or = to 2/10 left shoulder pain to for improved functional use of extremity. Baseline:  Goal status: INITIAL  5.  Patient will demonstrate > or = to 115 degrees in Rt shoulder A/ROM for improved reaching overhead. Baseline:  Goal status: INITIAL  6.  Patient's Annitta will be 64/100 for improved functional use of Lt UE and decreased disability.  Baseline: 52.3/100 Goal status: INITIAL  PLAN: PT FREQUENCY: 1-2x/week  PT DURATION: 8 weeks  PLANNED INTERVENTIONS: [id number]- PT Re-evaluation, 97110-Therapeutic exercises, 97530- Therapeutic activity, 97112- Neuromuscular re-education, 97535- Self Care, [id number]- Manual therapy, [phone]- Gait training,  [phone]- Canalith repositioning, [id number]- Aquatic Therapy, [phone]- Electrical stimulation (unattended), [phone]- Electrical stimulation (manual), [id number]- Vasopneumatic device, [id number]- Ultrasound, [id number]- Ionotophoresis 4mg /ml Dexamethasone , 79439 (1-2 muscles), [id number] (3+ muscles)- Dry Needling, Patient/Family education, Balance training, Stair training, Taping, Joint mobilization, Joint manipulation, Spinal manipulation, Spinal mobilization, Scar mobilization, Vestibular training, Cryotherapy, and Moist heat  PLAN FOR NEXT SESSION: 10th visit PN next time, Progress per protocol (in media); assess balance   Orvil Fester, PT 11/29/23 2:42 PM   [facility] [address] [place], [place] [address] Phone # [phone] Fax [phone]

## 2023-11-30 DIAGNOSIS — M25512 Pain in left shoulder: Secondary | ICD-10-CM | POA: Diagnosis not present

## 2023-12-01 ENCOUNTER — Encounter

## 2023-12-01 ENCOUNTER — Ambulatory Visit: Attending: Orthopaedic Surgery

## 2023-12-01 DIAGNOSIS — R252 Cramp and spasm: Secondary | ICD-10-CM | POA: Insufficient documentation

## 2023-12-01 DIAGNOSIS — G8929 Other chronic pain: Secondary | ICD-10-CM | POA: Diagnosis not present

## 2023-12-01 DIAGNOSIS — R2681 Unsteadiness on feet: Secondary | ICD-10-CM | POA: Diagnosis present

## 2023-12-01 DIAGNOSIS — M25512 Pain in left shoulder: Secondary | ICD-10-CM | POA: Diagnosis not present

## 2023-12-01 DIAGNOSIS — M25611 Stiffness of right shoulder, not elsewhere classified: Secondary | ICD-10-CM | POA: Diagnosis not present

## 2023-12-01 DIAGNOSIS — R262 Difficulty in walking, not elsewhere classified: Secondary | ICD-10-CM | POA: Diagnosis present

## 2023-12-01 DIAGNOSIS — M25521 Pain in right elbow: Secondary | ICD-10-CM | POA: Diagnosis not present

## 2023-12-01 DIAGNOSIS — M6281 Muscle weakness (generalized): Secondary | ICD-10-CM | POA: Diagnosis not present

## 2023-12-01 DIAGNOSIS — M25621 Stiffness of right elbow, not elsewhere classified: Secondary | ICD-10-CM | POA: Diagnosis not present

## 2023-12-01 DIAGNOSIS — M25612 Stiffness of left shoulder, not elsewhere classified: Secondary | ICD-10-CM | POA: Insufficient documentation

## 2023-12-01 DIAGNOSIS — R293 Abnormal posture: Secondary | ICD-10-CM | POA: Diagnosis not present

## 2023-12-01 DIAGNOSIS — M47816 Spondylosis without myelopathy or radiculopathy, lumbar region: Secondary | ICD-10-CM | POA: Insufficient documentation

## 2023-12-01 DIAGNOSIS — M5459 Other low back pain: Secondary | ICD-10-CM | POA: Insufficient documentation

## 2023-12-01 NOTE — Therapy (Signed)
 OUTPATIENT PHYSICAL THERAPY UPPER EXTREMITY TREATMENT NOTE Initial eval for Lumbar spine   Patient Name: Michelle Johnston MRN: [id number] DOB:09/03/1950, 73 y.o., female Today's Date: 12/01/2023  END OF SESSION:  PT End of Session - 12/01/23 1451     Visit Number 10    Number of Visits 16    Date for Recertification  12/20/23    Authorization Type 16 visits 10/25/23 to 12/20/23    Authorization - Visit Number 10    Authorization - Number of Visits 16    Progress Note Due on Visit 20    PT Start Time 1451    PT Stop Time 1538    PT Time Calculation (min) 47 min    Activity Tolerance Patient tolerated treatment well    Behavior During Therapy [facility] for tasks assessed/performed                 Past Medical History:  Diagnosis Date   Anxiety    Arthritis    Atrial fibrillation (HCC)    Bradycardia    Bronchitis    Depression    Dyspnea    GERD (gastroesophageal reflux disease)    Glaucoma    Hypertension    Presence of Watchman left atrial appendage closure device 08/07/2021   Watchman FLX 27mm with Dr. Cindie   Seasonal allergies    Sleep apnea    does not use CPAP   Wears glasses    Past Surgical History:  Procedure Laterality Date   ABDOMINAL HYSTERECTOMY  1990   ATRIAL FIBRILLATION ABLATION N/A 08/12/2020   Procedure: ATRIAL FIBRILLATION ABLATION;  Surgeon: Cindie Ole DASEN, MD;  Location: [facility];  Service: Cardiovascular;  Laterality: N/A;   ATRIAL FIBRILLATION ABLATION N/A 03/26/2023   Procedure: ATRIAL FIBRILLATION ABLATION;  Surgeon: Cindie Ole DASEN, MD;  Location: [facility];  Service: Cardiovascular;  Laterality: N/A;   BREAST BIOPSY Right    benign   CARDIOVERSION N/A 08/15/2019   Procedure: CARDIOVERSION;  Surgeon: Elmira Newman PARAS, MD;  Location: [facility];  Service: Cardiovascular;  Laterality: N/A;   COLONOSCOPY     LEFT ATRIAL APPENDAGE OCCLUSION N/A 08/07/2021   Procedure: LEFT ATRIAL APPENDAGE OCCLUSION;  Surgeon:  Cindie Ole DASEN, MD;  Location: [facility];  Service: Cardiovascular;  Laterality: N/A;   meniscus tear knee     left knee   NASAL SINUS SURGERY  1975   ORIF ANKLE FRACTURE  2009   left   ORIF HUMERUS FRACTURE Right 07/15/2023   Procedure: OPEN REDUCTION INTERNAL FIXATION (ORIF) DISTAL HUMERUS FRACTURE;  Surgeon: Cristy Bonner DASEN, MD;  Location: [facility];  Service: Orthopedics;  Laterality: Right;   REVERSE SHOULDER ARTHROPLASTY Left 09/02/2023   Procedure: ARTHROPLASTY, SHOULDER, TOTAL, REVERSE;  Surgeon: Cristy Bonner DASEN, MD;  Location: [facility];  Service: Orthopedics;  Laterality: Left;   TEE WITHOUT CARDIOVERSION N/A 08/07/2021   Procedure: TRANSESOPHAGEAL ECHOCARDIOGRAM (TEE);  Surgeon: Cindie Ole DASEN, MD;  Location: [facility] INVASIVE CV LAB;  Service: Cardiovascular;  Laterality: N/A;   TOTAL KNEE ARTHROPLASTY Left 02/03/2019   Procedure: LEFT TOTAL KNEE ARTHROPLASTY;  Surgeon: Vernetta Lonni GRADE, MD;  Location: [facility];  Service: Orthopedics;  Laterality: Left;   TURBINATE REDUCTION Bilateral 06/04/2014   Procedure: BILATERAL TURBINATE REDUCTION;  Surgeon: Daniel Moccasin, MD;  Location: [facility];  Service: ENT;  Laterality: Bilateral;   TYMPANOSTOMY TUBE PLACEMENT     left   Patient Active Problem List   Diagnosis  Date Noted   Near syncope 09/11/2023   Impacted cerumen of left ear 08/15/2023   Other specified disorders of eustachian tube, left ear 02/06/2023   Central perforation of tympanic membrane of left ear 02/06/2023   Presence of Watchman left atrial appendage closure device 08/07/2021   Atrial fibrillation (HCC) 08/07/2021   OSA (obstructive sleep apnea) 10/29/2020   Persistent atrial fibrillation (HCC) 03/27/2020   Hypercoagulable state due to persistent atrial fibrillation (HCC) 03/27/2020   PAD (peripheral artery disease) 12/27/2019   Exertional chest pain 12/27/2019   Primary osteoarthritis of right knee 07/12/2019   Paroxysmal atrial  fibrillation (HCC) 06/16/2019   Nonrheumatic aortic valve insufficiency 06/16/2019   Nonrheumatic mitral valve regurgitation 06/16/2019   Tachycardia 02/20/2019   Abnormal EKG 02/20/2019   Essential hypertension 02/20/2019   Status post total left knee replacement 02/03/2019   Unilateral primary osteoarthritis, left knee 11/30/2016   Chronic pain of left knee 11/30/2016    PCP:   Loreli Kins, MD    REFERRING PROVIDER: Cristy Bonner DASEN, MD/ Margeret Kotyk, MD  REFERRING DIAG: left total shoulder arthoplasty ORIF tuberosities/ lumbar stenosis  THERAPY DIAG:  Stiffness of right shoulder, not elsewhere classified - Plan: PT plan of care cert/re-cert  Stiffness of right elbow, not elsewhere classified - Plan: PT plan of care cert/re-cert  Other low back pain - Plan: PT plan of care cert/re-cert  Pain in right elbow - Plan: PT plan of care cert/re-cert  Chronic left shoulder pain - Plan: PT plan of care cert/re-cert  Stiffness of left shoulder, not elsewhere classified - Plan: PT plan of care cert/re-cert  Muscle weakness (generalized) - Plan: PT plan of care cert/re-cert  Abnormal posture - Plan: PT plan of care cert/re-cert  Cramp and spasm - Plan: PT plan of care cert/re-cert  Lumbar spondylosis - Plan: PT plan of care cert/re-cert  Rationale for Evaluation and Treatment: Rehabilitation  ONSET DATE: Surgery 09/02/2023  SUBJECTIVE:                                                                                                                                                                                      SUBJECTIVE STATEMENT: Patient arrives with new referral for lumbar spondylosis.  She reports pain is minimal to moderate.  I'm just getting to where I can't walk or stand upright  Hand dominance: Right  PERTINENT HISTORY: Anxiety; OA; Depression; HTN; Hx left TKA  PAIN:  12/01/23 Are you having pain? Yes: NPRS scale: 1-2/ 10 shoulder, 0/10 lumbar  Pain  location: left shoulder Pain description: dull;achy Aggravating factors: folding clothes Relieving factors: Ice; Tylenol    PRECAUTIONS: Shoulder and Fall see protocol in  media  RED FLAGS:  None   WEIGHT BEARING RESTRICTIONS: No  FALLS:  Has patient fallen in last 6 months? Yes. Number of falls 2 fall. Patient had two surgeries two weeks from each other due to falling. She fell while she was at the grocery store. She was turning and her husband said her feet did not move with her. PT to address balance   LIVING ENVIRONMENT: Lives with: lives with their spouse Lives in: House/apartment Stairs: Yes: Internal: 12 steps; on left going up Has following equipment at home: Single point cane and Walker - 2 wheeled  OCCUPATION: Retired  PLOF: Independent, Independent with basic ADLs, Independent with household mobility without device, Independent with community mobility without device, Independent with gait, and Independent with transfers  PATIENT GOALS: use of my arm again. To get back to water  aerobics and drive  NEXT MD VISIT: September 30th   OBJECTIVE:  Note: Objective measures were completed at Evaluation unless otherwise noted.   PATIENT SURVEYS :  QuickDASH : 52.3/100 52.3%  12/01/23 ODI: 20/50= 40%  COGNITION: Overall cognitive status: Within functional limits for tasks assessed     SENSATION: WFL  POSTURE: Rounded shoulder & forward head Standing posture/ gait: right pelvic shift and left trunk lean  UPPER EXTREMITY ROM:   Passive ROM Right eval Left eval Left 11/17/23  Shoulder flexion  90 150  Shoulder extension  NT due to restrictions   Shoulder abduction  75 85  Shoulder adduction     Shoulder internal rotation  NT due to restrictions   Shoulder external rotation  NT due to restrictions 22  Elbow flexion     Elbow extension     Wrist flexion     Wrist extension     Wrist ulnar deviation     Wrist radial deviation     Wrist pronation     Wrist  supination     (Blank rows = not tested)  11/17/23 seated scaption  5 deg, flex 68 deg  UPPER EXTREMITY MMT: NT due to weight restrictions of 1lb  LUMBAR ROM: All WFL  LOWER EXTREMITY MMT: significant proximal hip weakness, quad 4-/5,  DF 4-/5   LOWER EXTREMITY ROM:  FUNCTIONAL TESTING Initial eval: 5STS: 17.47 sec no UE support TUG:14.32 sec  12/01/23: 5STS: 19.78 sec no UE support TUG:12.06 sec SLS: severe trendelenburg but able to hold approx 2-3 sec Tandem stance: needs help to get into position but able to hold 2-3 sec   JOINT MOBILITY TESTING:  See PROM measurement above Open end feel                                                                                                                              TREATMENT DATE:  12/01/23: Initial eval for lumbar spine Educated patient on severe postural issues, need to walk on a regular basis, would suggest rollator walker for safety (educated on why and how being more sedentary due  to unsafe gait would lead to further decline), home safety education  11/29/23: UBE L2 3x3 fwd/bwd PT present to discuss status Standing L at counter for shoulder flexion stretch Standing Lt AROM flexion to shoulder height shelf focused on scapular mechanics Ranger flexion Lt UE full range 2x10 Finger ladder with focus on scapular mechanics Supine flexion 1x10 1lb, 1x10 no weight Supine circles Lt UE CW and CCW x10 each way 1lb SL Lt shoulder abd 2x10, second set added rhythmic stabilization at 90 deg abd Standing row green tband x20 Lt UE wall slides to 100 deg, step back and lower (very challenging) Ball on the wall standing x10 each way  11/24/23: UBE L2 3'x3' fwd/bwd PT present to discuss status  Ball on the wall 2x5 each: scaption roll out, circles CW and CCW, Lt UE; also did flexion and scaption roll outs on red plyoball on elevated mat Worked on scapular strengthening in standing for decreased levator activation with flex and  scaption; standing at counter with B hands on counter, elbows straight working on scapular retraction and low trap engagement; cues for chest up; thoracic spine mobility Standing active flexion and scaption with mirror  Standing and seated flexion with hands clasped x 10 ea Seated ER AROM x 10 Supine dowel flexion bil shoulders, then shoulder flexion and scaption with dowel x2lb 2x10 each 2lb Lt UE 90 deg circles CW and CCW in supine x15 each S/L Lt shoulder abd x15 A/ROM Lt scapular clocks 1# x10 each: 3:00-9:00, 11:00-5:00 cues for correct form Supine ER B 2x10 cue for correct form Prone L shoulder ext and rows 1# to neutral 2x10 Ice to L shoulder at end of session x 10 min  PATIENT EDUCATION: Education details: PT eval findings, anticipated POC, progress with PT, and initial HEP Person educated: Patient Education method: Explanation, Demonstration, and Handouts Education comprehension: verbalized understanding, returned demonstration, and needs further education  HOME EXERCISE PROGRAM: Access Code: [email or web address] URL: [email or web address] Date: 10/25/2023 Prepared by: Kristeen Sar  Exercises - Supine Shoulder Flexion with Dowel  - 1 x daily - 7 x weekly - 1-2 sets - 10 reps - Seated Scapular Retraction  - 1 x daily - 7 x weekly - 1 sets - 10 reps - Isometric Shoulder Flexion  - 1 x daily - 7 x weekly - 2 sets - 5 reps - 5 hold - Standing Isometric Shoulder Abduction with Doorway - Arm Bent  - 1 x daily - 7 x weekly - 2 sets - 5 reps - 5 hold - Seated Shoulder Flexion AAROM with Pulley Behind  - 1 x daily - 7 x weekly - 2 sets - 10 reps - Seated Shoulder Scaption AAROM with Pulley at Side  - 1 x daily - 7 x weekly - 2 sets - 10 reps  ASSESSMENT:  CLINICAL IMPRESSION: Michelle Johnston was assess for her lumbar issues.  She presents with severe trendelenburg gait and severe right knee OA with valgus deformity which likely contributes to her back issues.  Lumbar ROM is WNL but patient  has tight hamstrings, hip flexors and hip rotators.  Proximal hip strength is poor, especially hip abduction, hip extension and glut med.   She is currently being treated for shoulder replacement.  We will continue to work on this POC and will add in POC for lumbar spine.  She would benefit from continuing skilled PT for proximal hip strengthening, LE flexibility, core strengthening and gait training.     OBJECTIVE IMPAIRMENTS: Abnormal gait, decreased  balance, decreased ROM, decreased strength, hypomobility, increased muscle spasms, impaired flexibility, impaired UE functional use, postural dysfunction, and pain.   ACTIVITY LIMITATIONS: carrying, lifting, bending, squatting, stairs, bathing, dressing, reach over head, and hygiene/grooming  PARTICIPATION LIMITATIONS: meal prep, cleaning, laundry, interpersonal relationship, driving, shopping, and community activity  PERSONAL FACTORS: Age and 3+ comorbidities: falls risk HTN; anxiety; depression are also affecting patient's functional outcome.   REHAB POTENTIAL: Good  CLINICAL DECISION MAKING: Evolving/moderate complexity  EVALUATION COMPLEXITY: Moderate  GOALS: Goals reviewed with patient? Yes  SHORT TERM GOALS: Target date: 11/22/2023   Patient will be independent with initial HEP. Baseline:  Goal status: In Progress  2.  Patient will report > or = to 40% use of Lt UE with light home and self care tasks due to improved ROM and strength. Baseline:  Goal status: In Progress  3.  Patient will demonstrate correct seated and standing posture to reduce mechanical strain. Baseline:  Goal status: INITIAL  4.  .  Patient will demonstrate > or = to 100 degrees in Lt  shoulder P/ROM for improved reaching. Baseline:  Goal status: MET 150 deg 11/17/23  5.  Patient to report ability to ambulate for at least 5 min without need to sit.  Baseline:  Goal status: Initial   6. Patient to obtain proper assistive device to reduce fall  risk  Baseline: not using anything  Goals status: Initial    LONG TERM GOALS: Target date: 12/20/2023  Patient will demonstrate independence in advanced HEP. Baseline:  Goal status: INITIAL  2.  .  Patient will report > or = to 70% of normal use of Rt UE for ADLs due to improved functional strength and A/ROM. Baseline:  Goal status: INITIAL  3.  Patient will be able to return to return water  aerobics program with no difficulty to establish normal exercise routine Baseline:  Goal status: INITIAL   4.  Patient will be able to wash dishes and fold clothes with < or = to 2/10 left shoulder pain to for improved functional use of extremity. Baseline:  Goal status: INITIAL  5.  Patient will demonstrate > or = to 115 degrees in Rt shoulder A/ROM for improved reaching overhead. Baseline:  Goal status: INITIAL  6.  Patient's Annitta will be 64/100 for improved functional use of Lt UE and decreased disability.  Baseline: 52.3/100 Goal status: INITIAL  7. ODI to improve to 16/50  Baseline: 20/50  Goal status: Initial  8. Patient to be able to stand or walk for at least 15 min without rest  Baseline:   Goal status: Initial  PLAN: PT FREQUENCY: 1-2x/week  PT DURATION: 8 weeks  PLANNED INTERVENTIONS: 97164- PT Re-evaluation, 97110-Therapeutic exercises, 97530- Therapeutic activity, 97112- Neuromuscular re-education, 97535- Self Care, [id number]- Manual therapy, [phone]- Gait training, [phone]- Canalith repositioning, [id number]- Aquatic Therapy, [phone]- Electrical stimulation (unattended), [phone]- Electrical stimulation (manual), [id number]- Vasopneumatic device, [id number]- Ultrasound, [id number]- Ionotophoresis 4mg /ml Dexamethasone , [id number] (1-2 muscles), [id number] (3+ muscles)- Dry Needling, Patient/Family education, Balance training, Stair training, Taping, Joint mobilization, Joint manipulation, Spinal manipulation, Spinal mobilization, Scar mobilization, Vestibular training, Cryotherapy, and Moist heat  PLAN FOR  NEXT SESSION: Begin proximal hip strengthening, core strengthening, LE flexibility and continue shoulder rehab   Keyonna Comunale B. Aristidis Talerico, PT 12/01/23 8:11 PM [facility] [address] [place], [place] [address] Phone # [phone] Fax [phone]

## 2023-12-05 NOTE — Therapy (Signed)
 OUTPATIENT PHYSICAL THERAPY UPPER EXTREMITY TREATMENT NOTE Initial eval for Lumbar spine   Patient Name: Michelle Johnston MRN: [id number] DOB:15-Mar-1950, 73 y.o., female Today's Date: 12/06/2023  END OF SESSION:  PT End of Session - 12/06/23 1150     Visit Number 11    Number of Visits 16    Date for Recertification  12/20/23    Authorization Type 16 visits 10/25/23 to 12/20/23    Authorization - Visit Number 11    Authorization - Number of Visits 16    Progress Note Due on Visit 20    PT Start Time 1148    PT Stop Time 1230    PT Time Calculation (min) 42 min    Activity Tolerance Patient tolerated treatment well    Behavior During Therapy [facility] for tasks assessed/performed                  Past Medical History:  Diagnosis Date   Anxiety    Arthritis    Atrial fibrillation (HCC)    Bradycardia    Bronchitis    Depression    Dyspnea    GERD (gastroesophageal reflux disease)    Glaucoma    Hypertension    Presence of Watchman left atrial appendage closure device 08/07/2021   Watchman FLX 27mm with Dr. Cindie   Seasonal allergies    Sleep apnea    does not use CPAP   Wears glasses    Past Surgical History:  Procedure Laterality Date   ABDOMINAL HYSTERECTOMY  1990   ATRIAL FIBRILLATION ABLATION N/A 08/12/2020   Procedure: ATRIAL FIBRILLATION ABLATION;  Surgeon: Cindie Ole DASEN, MD;  Location: [facility];  Service: Cardiovascular;  Laterality: N/A;   ATRIAL FIBRILLATION ABLATION N/A 03/26/2023   Procedure: ATRIAL FIBRILLATION ABLATION;  Surgeon: Cindie Ole DASEN, MD;  Location: [facility];  Service: Cardiovascular;  Laterality: N/A;   BREAST BIOPSY Right    benign   CARDIOVERSION N/A 08/15/2019   Procedure: CARDIOVERSION;  Surgeon: Elmira Newman PARAS, MD;  Location: [facility];  Service: Cardiovascular;  Laterality: N/A;   COLONOSCOPY     LEFT ATRIAL APPENDAGE OCCLUSION N/A 08/07/2021   Procedure: LEFT ATRIAL APPENDAGE OCCLUSION;  Surgeon:  Cindie Ole DASEN, MD;  Location: [facility];  Service: Cardiovascular;  Laterality: N/A;   meniscus tear knee     left knee   NASAL SINUS SURGERY  1975   ORIF ANKLE FRACTURE  2009   left   ORIF HUMERUS FRACTURE Right 07/15/2023   Procedure: OPEN REDUCTION INTERNAL FIXATION (ORIF) DISTAL HUMERUS FRACTURE;  Surgeon: Cristy Bonner DASEN, MD;  Location: [facility];  Service: Orthopedics;  Laterality: Right;   REVERSE SHOULDER ARTHROPLASTY Left 09/02/2023   Procedure: ARTHROPLASTY, SHOULDER, TOTAL, REVERSE;  Surgeon: Cristy Bonner DASEN, MD;  Location: [facility] OR;  Service: Orthopedics;  Laterality: Left;   TEE WITHOUT CARDIOVERSION N/A 08/07/2021   Procedure: TRANSESOPHAGEAL ECHOCARDIOGRAM (TEE);  Surgeon: Cindie Ole DASEN, MD;  Location: [facility];  Service: Cardiovascular;  Laterality: N/A;   TOTAL KNEE ARTHROPLASTY Left 02/03/2019   Procedure: LEFT TOTAL KNEE ARTHROPLASTY;  Surgeon: Vernetta Lonni GRADE, MD;  Location: [facility];  Service: Orthopedics;  Laterality: Left;   TURBINATE REDUCTION Bilateral 06/04/2014   Procedure: BILATERAL TURBINATE REDUCTION;  Surgeon: Daniel Moccasin, MD;  Location:  SURGERY CENTER;  Service: ENT;  Laterality: Bilateral;   TYMPANOSTOMY TUBE PLACEMENT     left   Patient Active Problem List  Diagnosis Date Noted   Near syncope 09/11/2023   Impacted cerumen of left ear 08/15/2023   Other specified disorders of eustachian tube, left ear 02/06/2023   Central perforation of tympanic membrane of left ear 02/06/2023   Presence of Watchman left atrial appendage closure device 08/07/2021   Atrial fibrillation (HCC) 08/07/2021   OSA (obstructive sleep apnea) 10/29/2020   Persistent atrial fibrillation (HCC) 03/27/2020   Hypercoagulable state due to persistent atrial fibrillation (HCC) 03/27/2020   PAD (peripheral artery disease) 12/27/2019   Exertional chest pain 12/27/2019   Primary osteoarthritis of right knee 07/12/2019   Paroxysmal atrial  fibrillation (HCC) 06/16/2019   Nonrheumatic aortic valve insufficiency 06/16/2019   Nonrheumatic mitral valve regurgitation 06/16/2019   Tachycardia 02/20/2019   Abnormal EKG 02/20/2019   Essential hypertension 02/20/2019   Status post total left knee replacement 02/03/2019   Unilateral primary osteoarthritis, left knee 11/30/2016   Chronic pain of left knee 11/30/2016    PCP:   Loreli Kins, MD    REFERRING PROVIDER: Cristy Bonner DASEN, MD/ Margeret Kotyk, MD  REFERRING DIAG: left total shoulder arthoplasty ORIF tuberosities/ lumbar stenosis  THERAPY DIAG:  Stiffness of right shoulder, not elsewhere classified  Stiffness of right elbow, not elsewhere classified  Other low back pain  Rationale for Evaluation and Treatment: Rehabilitation  ONSET DATE: Surgery 09/02/2023  SUBJECTIVE:                                                                                                                                                                                      SUBJECTIVE STATEMENT: Very little pain today. I go see my hip specialist tomorrow.  Hand dominance: Right  PERTINENT HISTORY: Anxiety; OA; Depression; HTN; Hx left TKA  PAIN:  12/06/23 Are you having pain? Yes: NPRS scale: 2/ 10 shoulder, 5/10 lumbar  Pain location: left shoulder Pain description: dull;achy Aggravating factors: folding clothes Relieving factors: Ice; Tylenol    PRECAUTIONS: Shoulder and Fall see protocol in media  RED FLAGS:  None   WEIGHT BEARING RESTRICTIONS: No  FALLS:  Has patient fallen in last 6 months? Yes. Number of falls 2 fall. Patient had two surgeries two weeks from each other due to falling. She fell while she was at the grocery store. She was turning and her husband said her feet did not move with her. PT to address balance   LIVING ENVIRONMENT: Lives with: lives with their spouse Lives in: House/apartment Stairs: Yes: Internal: 12 steps; on left going up Has following  equipment at home: Single point cane and Walker - 2 wheeled  OCCUPATION: Retired  PLOF: Independent, Independent with basic ADLs, Independent with household mobility without device,  Independent with community mobility without device, Independent with gait, and Independent with transfers  PATIENT GOALS: use of my arm again. To get back to water  aerobics and drive  NEXT MD VISIT: September 30th   OBJECTIVE:  Note: Objective measures were completed at Evaluation unless otherwise noted.   PATIENT SURVEYS :  QuickDASH : 52.3/100 52.3%  12/01/23 ODI: 20/50= 40%  COGNITION: Overall cognitive status: Within functional limits for tasks assessed     SENSATION: WFL  POSTURE: Rounded shoulder & forward head Standing posture/ gait: right pelvic shift and left trunk lean  UPPER EXTREMITY ROM:   Passive ROM Right eval Left eval Left 11/17/23  Shoulder flexion  90 150  Shoulder extension  NT due to restrictions   Shoulder abduction  75 85  Shoulder adduction     Shoulder internal rotation  NT due to restrictions   Shoulder external rotation  NT due to restrictions 22  Elbow flexion     Elbow extension     Wrist flexion     Wrist extension     Wrist ulnar deviation     Wrist radial deviation     Wrist pronation     Wrist supination     (Blank rows = not tested)  11/17/23 seated scaption  5 deg, flex 68 deg  UPPER EXTREMITY MMT: NT due to weight restrictions of 1lb  LUMBAR ROM: All WFL  LOWER EXTREMITY MMT: significant proximal hip weakness, quad 4-/5,  DF 4-/5   LOWER EXTREMITY ROM:  FUNCTIONAL TESTING Initial eval: 5STS: 17.47 sec no UE support TUG:14.32 sec  12/01/23: 5STS: 19.78 sec no UE support TUG:12.06 sec SLS: severe trendelenburg but able to hold approx 2-3 sec Tandem stance: needs help to get into position but able to hold 2-3 sec   JOINT MOBILITY TESTING:  See PROM measurement above Open end feel                                                                                                                               TREATMENT DATE:  12/06/23 Nustep L1 x 2 min L 3 x 3 min Standing hip ABD 2 x 10 cues for toe at wall (R) and no hip drop R when standing on R. Standing hip ext with bent knee 2x10 - cue for no hip drop Standing march unable to maintain even pelvis Step ups fwd 4 inch too difficult Seated clam red loop x 20 - no difficulty S/L clam red loop 2x10 B  Bridge with red loop 2x 10 SL Lt shoulder abd x10, then with 2# x 10 Attempted S/L ER - unable Supine ER/IR with 1# x 20 watch for elbow extension Supine biceps 1# x 10   12/01/23: Initial eval for lumbar spine Educated patient on severe postural issues, need to walk on a regular basis, would suggest rollator walker for safety (educated on why and how being more sedentary due to unsafe gait would lead  to further decline), home safety education  11/29/23: UBE L2 3x3 fwd/bwd PT present to discuss status Standing L at counter for shoulder flexion stretch Standing Lt AROM flexion to shoulder height shelf focused on scapular mechanics Ranger flexion Lt UE full range 2x10 Finger ladder with focus on scapular mechanics Supine flexion 1x10 1lb, 1x10 no weight Supine circles Lt UE CW and CCW x10 each way 1lb SL Lt shoulder abd 2x10, second set added rhythmic stabilization at 90 deg abd Standing row green tband x20 Lt UE wall slides to 100 deg, step back and lower (very challenging) Ball on the wall standing x10 each way  11/24/23: UBE L2 3'x3' fwd/bwd PT present to discuss status  Ball on the wall 2x5 each: scaption roll out, circles CW and CCW, Lt UE; also did flexion and scaption roll outs on red plyoball on elevated mat Worked on scapular strengthening in standing for decreased levator activation with flex and scaption; standing at counter with B hands on counter, elbows straight working on scapular retraction and low trap engagement; cues for chest up; thoracic spine  mobility Standing active flexion and scaption with mirror  Standing and seated flexion with hands clasped x 10 ea Seated ER AROM x 10 Supine dowel flexion bil shoulders, then shoulder flexion and scaption with dowel x2lb 2x10 each 2lb Lt UE 90 deg circles CW and CCW in supine x15 each S/L Lt shoulder abd x15 A/ROM Lt scapular clocks 1# x10 each: 3:00-9:00, 11:00-5:00 cues for correct form Supine ER B 2x10 cue for correct form Prone L shoulder ext and rows 1# to neutral 2x10 Ice to L shoulder at end of session x 10 min  PATIENT EDUCATION: Education details: PT eval findings, anticipated POC, progress with PT, and initial HEP Person educated: Patient Education method: Explanation, Demonstration, and Handouts Education comprehension: verbalized understanding, returned demonstration, and needs further education  HOME EXERCISE PROGRAM: Access Code: [email or web address] URL: [email or web address] Date: 12/06/2023 Prepared by: Mliss  Exercises - Supine Shoulder Flexion with Dowel  - 1 x daily - 7 x weekly - 1-2 sets - 10 reps - Seated Scapular Retraction  - 1 x daily - 7 x weekly - 1 sets - 10 reps - Isometric Shoulder Flexion  - 1 x daily - 7 x weekly - 2 sets - 5 reps - 5 hold - Standing Isometric Shoulder Abduction with Doorway - Arm Bent  - 1 x daily - 7 x weekly - 2 sets - 5 reps - 5 hold - Seated Shoulder Flexion AAROM with Pulley Behind  - 1 x daily - 7 x weekly - 2 sets - 10 reps - Seated Shoulder Scaption AAROM with Pulley at Side  - 1 x daily - 7 x weekly - 2 sets - 10 reps - Standing Hip Abduction with Counter Support  - 1 x daily - 7 x weekly - 2 sets - 10 reps - Standing Hip Extension with Leg Bent and Support  - 1 x daily - 7 x weekly - 2 sets - 10 reps  ASSESSMENT:  CLINICAL IMPRESSION: Focused on initiating glue med strengthening today which is very challenging for pt. Advanced HEP with hip ABD and ext as she could maintain a level pelvis until fatigue set in. Marching  was too difficult. She is 14 weeks post o[ on her shoulder and is able to progress with strengthening and stretching. We did a few exercises today for this, but plan to progress HEP next visits.    OBJECTIVE IMPAIRMENTS:  Abnormal gait, decreased balance, decreased ROM, decreased strength, hypomobility, increased muscle spasms, impaired flexibility, impaired UE functional use, postural dysfunction, and pain.   ACTIVITY LIMITATIONS: carrying, lifting, bending, squatting, stairs, bathing, dressing, reach over head, and hygiene/grooming  PARTICIPATION LIMITATIONS: meal prep, cleaning, laundry, interpersonal relationship, driving, shopping, and community activity  PERSONAL FACTORS: Age and 3+ comorbidities: falls risk HTN; anxiety; depression are also affecting patient's functional outcome.   REHAB POTENTIAL: Good  CLINICAL DECISION MAKING: Evolving/moderate complexity  EVALUATION COMPLEXITY: Moderate  GOALS: Goals reviewed with patient? Yes  SHORT TERM GOALS: Target date: 11/22/2023   Patient will be independent with initial HEP. Baseline:  Goal status: In Progress  2.  Patient will report > or = to 40% use of Lt UE with light home and self care tasks due to improved ROM and strength. Baseline:  Goal status: In Progress  3.  Patient will demonstrate correct seated and standing posture to reduce mechanical strain. Baseline:  Goal status: INITIAL  4.  .  Patient will demonstrate > or = to 100 degrees in Lt  shoulder P/ROM for improved reaching. Baseline:  Goal status: MET 150 deg 11/17/23  5.  Patient to report ability to ambulate for at least 5 min without need to sit.  Baseline:  Goal status: Initial   6. Patient to obtain proper assistive device to reduce fall risk  Baseline: not using anything  Goals status: Initial    LONG TERM GOALS: Target date: 12/20/2023  Patient will demonstrate independence in advanced HEP. Baseline:  Goal status: INITIAL  2.  .  Patient will  report > or = to 70% of normal use of Rt UE for ADLs due to improved functional strength and A/ROM. Baseline:  Goal status: INITIAL  3.  Patient will be able to return to return water  aerobics program with no difficulty to establish normal exercise routine Baseline:  Goal status: INITIAL   4.  Patient will be able to wash dishes and fold clothes with < or = to 2/10 left shoulder pain to for improved functional use of extremity. Baseline:  Goal status: INITIAL  5.  Patient will demonstrate > or = to 115 degrees in Rt shoulder A/ROM for improved reaching overhead. Baseline:  Goal status: INITIAL  6.  Patient's Annitta will be 64/100 for improved functional use of Lt UE and decreased disability.  Baseline: 52.3/100 Goal status: INITIAL  7. ODI to improve to 16/50  Baseline: 20/50  Goal status: Initial  8. Patient to be able to stand or walk for at least 15 min without rest  Baseline:   Goal status: Initial  PLAN: PT FREQUENCY: 1-2x/week  PT DURATION: 8 weeks  PLANNED INTERVENTIONS: 97164- PT Re-evaluation, 97110-Therapeutic exercises, 97530- Therapeutic activity, 97112- Neuromuscular re-education, 97535- Self Care, [id number]- Manual therapy, [phone]- Gait training, [phone]- Canalith repositioning, [id number]- Aquatic Therapy, [phone]- Electrical stimulation (unattended), [phone]- Electrical stimulation (manual), [id number]- Vasopneumatic device, [id number]- Ultrasound, [id number]- Ionotophoresis 4mg /ml Dexamethasone , 20560 (1-2 muscles), 20561 (3+ muscles)- Dry Needling, Patient/Family education, Balance training, Stair training, Taping, Joint mobilization, Joint manipulation, Spinal manipulation, Spinal mobilization, Scar mobilization, Vestibular training, Cryotherapy, and Moist heat  PLAN FOR NEXT SESSION: Begin proximal hip strengthening, core strengthening, LE flexibility and continue shoulder rehab   Mliss Cummins, PT  12/06/23 12:34 PM [facility] [address] [place], [place] [address] Phone # [phone] Fax [phone]

## 2023-12-06 ENCOUNTER — Ambulatory Visit: Admitting: Physical Therapy

## 2023-12-06 ENCOUNTER — Encounter: Payer: Self-pay | Admitting: Physical Therapy

## 2023-12-06 DIAGNOSIS — G8929 Other chronic pain: Secondary | ICD-10-CM | POA: Diagnosis not present

## 2023-12-06 DIAGNOSIS — M25611 Stiffness of right shoulder, not elsewhere classified: Secondary | ICD-10-CM | POA: Diagnosis not present

## 2023-12-06 DIAGNOSIS — M5459 Other low back pain: Secondary | ICD-10-CM | POA: Diagnosis not present

## 2023-12-06 DIAGNOSIS — R293 Abnormal posture: Secondary | ICD-10-CM | POA: Diagnosis not present

## 2023-12-06 DIAGNOSIS — M25621 Stiffness of right elbow, not elsewhere classified: Secondary | ICD-10-CM | POA: Diagnosis not present

## 2023-12-06 DIAGNOSIS — M25512 Pain in left shoulder: Secondary | ICD-10-CM | POA: Diagnosis not present

## 2023-12-06 DIAGNOSIS — M6281 Muscle weakness (generalized): Secondary | ICD-10-CM | POA: Diagnosis not present

## 2023-12-06 DIAGNOSIS — M25521 Pain in right elbow: Secondary | ICD-10-CM | POA: Diagnosis not present

## 2023-12-06 DIAGNOSIS — M25612 Stiffness of left shoulder, not elsewhere classified: Secondary | ICD-10-CM | POA: Diagnosis not present

## 2023-12-07 DIAGNOSIS — M1711 Unilateral primary osteoarthritis, right knee: Secondary | ICD-10-CM | POA: Diagnosis not present

## 2023-12-07 DIAGNOSIS — M76891 Other specified enthesopathies of right lower limb, excluding foot: Secondary | ICD-10-CM | POA: Diagnosis not present

## 2023-12-07 DIAGNOSIS — M25561 Pain in right knee: Secondary | ICD-10-CM | POA: Diagnosis not present

## 2023-12-07 DIAGNOSIS — M25551 Pain in right hip: Secondary | ICD-10-CM | POA: Diagnosis not present

## 2023-12-08 ENCOUNTER — Encounter: Admitting: Physical Therapy

## 2023-12-13 ENCOUNTER — Ambulatory Visit: Admitting: Physical Therapy

## 2023-12-13 ENCOUNTER — Encounter: Payer: Self-pay | Admitting: Physical Therapy

## 2023-12-13 DIAGNOSIS — R293 Abnormal posture: Secondary | ICD-10-CM | POA: Diagnosis not present

## 2023-12-13 DIAGNOSIS — G8929 Other chronic pain: Secondary | ICD-10-CM | POA: Diagnosis not present

## 2023-12-13 DIAGNOSIS — M25521 Pain in right elbow: Secondary | ICD-10-CM

## 2023-12-13 DIAGNOSIS — M25621 Stiffness of right elbow, not elsewhere classified: Secondary | ICD-10-CM | POA: Diagnosis not present

## 2023-12-13 DIAGNOSIS — M5459 Other low back pain: Secondary | ICD-10-CM | POA: Diagnosis not present

## 2023-12-13 DIAGNOSIS — M25611 Stiffness of right shoulder, not elsewhere classified: Secondary | ICD-10-CM | POA: Diagnosis not present

## 2023-12-13 DIAGNOSIS — M25612 Stiffness of left shoulder, not elsewhere classified: Secondary | ICD-10-CM | POA: Diagnosis not present

## 2023-12-13 DIAGNOSIS — R252 Cramp and spasm: Secondary | ICD-10-CM

## 2023-12-13 DIAGNOSIS — M6281 Muscle weakness (generalized): Secondary | ICD-10-CM

## 2023-12-13 DIAGNOSIS — M47816 Spondylosis without myelopathy or radiculopathy, lumbar region: Secondary | ICD-10-CM

## 2023-12-13 DIAGNOSIS — M25512 Pain in left shoulder: Secondary | ICD-10-CM | POA: Diagnosis not present

## 2023-12-13 NOTE — Therapy (Signed)
 OUTPATIENT PHYSICAL THERAPY UPPER EXTREMITY and LUMBAR TREATMENT NOTE    Patient Name: Michelle Johnston MRN: [id number] DOB:10/07/1950, 73 y.o., female Today's Date: 12/13/2023  END OF SESSION:  PT End of Session - 12/13/23 0850     Visit Number 12    Number of Visits 16    Date for Recertification  12/20/23    Authorization Type 16 visits 10/25/23 to 12/20/23    Authorization - Visit Number 12    Authorization - Number of Visits 16    Progress Note Due on Visit 20    PT Start Time 0845    PT Stop Time 0930    PT Time Calculation (min) 45 min    Activity Tolerance Patient tolerated treatment well    Behavior During Therapy [facility] for tasks assessed/performed                   Past Medical History:  Diagnosis Date   Anxiety    Arthritis    Atrial fibrillation (HCC)    Bradycardia    Bronchitis    Depression    Dyspnea    GERD (gastroesophageal reflux disease)    Glaucoma    Hypertension    Presence of Watchman left atrial appendage closure device 08/07/2021   Watchman FLX 27mm with Dr. Cindie   Seasonal allergies    Sleep apnea    does not use CPAP   Wears glasses    Past Surgical History:  Procedure Laterality Date   ABDOMINAL HYSTERECTOMY  1990   ATRIAL FIBRILLATION ABLATION N/A 08/12/2020   Procedure: ATRIAL FIBRILLATION ABLATION;  Surgeon: Cindie Ole DASEN, MD;  Location: [facility];  Service: Cardiovascular;  Laterality: N/A;   ATRIAL FIBRILLATION ABLATION N/A 03/26/2023   Procedure: ATRIAL FIBRILLATION ABLATION;  Surgeon: Cindie Ole DASEN, MD;  Location: [facility];  Service: Cardiovascular;  Laterality: N/A;   BREAST BIOPSY Right    benign   CARDIOVERSION N/A 08/15/2019   Procedure: CARDIOVERSION;  Surgeon: Elmira Newman PARAS, MD;  Location: [facility];  Service: Cardiovascular;  Laterality: N/A;   COLONOSCOPY     LEFT ATRIAL APPENDAGE OCCLUSION N/A 08/07/2021   Procedure: LEFT ATRIAL APPENDAGE OCCLUSION;  Surgeon: Cindie Ole DASEN, MD;  Location: [facility];  Service: Cardiovascular;  Laterality: N/A;   meniscus tear knee     left knee   NASAL SINUS SURGERY  1975   ORIF ANKLE FRACTURE  2009   left   ORIF HUMERUS FRACTURE Right 07/15/2023   Procedure: OPEN REDUCTION INTERNAL FIXATION (ORIF) DISTAL HUMERUS FRACTURE;  Surgeon: Cristy Bonner DASEN, MD;  Location: [facility];  Service: Orthopedics;  Laterality: Right;   REVERSE SHOULDER ARTHROPLASTY Left 09/02/2023   Procedure: ARTHROPLASTY, SHOULDER, TOTAL, REVERSE;  Surgeon: Cristy Bonner DASEN, MD;  Location: [facility];  Service: Orthopedics;  Laterality: Left;   TEE WITHOUT CARDIOVERSION N/A 08/07/2021   Procedure: TRANSESOPHAGEAL ECHOCARDIOGRAM (TEE);  Surgeon: Cindie Ole DASEN, MD;  Location: [facility];  Service: Cardiovascular;  Laterality: N/A;   TOTAL KNEE ARTHROPLASTY Left 02/03/2019   Procedure: LEFT TOTAL KNEE ARTHROPLASTY;  Surgeon: Vernetta Lonni GRADE, MD;  Location: [facility];  Service: Orthopedics;  Laterality: Left;   TURBINATE REDUCTION Bilateral 06/04/2014   Procedure: BILATERAL TURBINATE REDUCTION;  Surgeon: Daniel Moccasin, MD;  Location: [facility];  Service: ENT;  Laterality: Bilateral;   TYMPANOSTOMY TUBE PLACEMENT     left   Patient Active Problem List   Diagnosis  Date Noted   Near syncope 09/11/2023   Impacted cerumen of left ear 08/15/2023   Other specified disorders of eustachian tube, left ear 02/06/2023   Central perforation of tympanic membrane of left ear 02/06/2023   Presence of Watchman left atrial appendage closure device 08/07/2021   Atrial fibrillation (HCC) 08/07/2021   OSA (obstructive sleep apnea) 10/29/2020   Persistent atrial fibrillation (HCC) 03/27/2020   Hypercoagulable state due to persistent atrial fibrillation (HCC) 03/27/2020   PAD (peripheral artery disease) 12/27/2019   Exertional chest pain 12/27/2019   Primary osteoarthritis of right knee 07/12/2019   Paroxysmal atrial fibrillation  (HCC) 06/16/2019   Nonrheumatic aortic valve insufficiency 06/16/2019   Nonrheumatic mitral valve regurgitation 06/16/2019   Tachycardia 02/20/2019   Abnormal EKG 02/20/2019   Essential hypertension 02/20/2019   Status post total left knee replacement 02/03/2019   Unilateral primary osteoarthritis, left knee 11/30/2016   Chronic pain of left knee 11/30/2016    PCP:   Loreli Kins, MD    REFERRING PROVIDER: Cristy Bonner DASEN, MD/ Margeret Kotyk, MD  REFERRING DIAG: left total shoulder arthoplasty ORIF tuberosities/ lumbar stenosis  THERAPY DIAG:  Stiffness of right shoulder, not elsewhere classified  Other low back pain  Pain in right elbow  Chronic left shoulder pain  Muscle weakness (generalized)  Lumbar spondylosis  Abnormal posture  Cramp and spasm  Rationale for Evaluation and Treatment: Rehabilitation  ONSET DATE: Surgery 09/02/2023  SUBJECTIVE:                                                                                                                                                                                      SUBJECTIVE STATEMENT: I got two injections into my lumbar spine on Friday which has already helped my pain.  I've had them before but it's been a long time.  The right hip and knee hurt more than the back right now. My shoulder is coming along well.  I still can't reach up to curl my hair.    Hand dominance: Right  PERTINENT HISTORY: Anxiety; OA; Depression; HTN; Hx left TKA  PAIN:  12/06/23 Are you having pain? Yes: NPRS scale: 1/ 10 shoulder, 1/10 lumbar  Pain location: left shoulder Pain description: dull;achy Aggravating factors: folding clothes Relieving factors: Ice; Tylenol    PRECAUTIONS: Shoulder and Fall see protocol in media  RED FLAGS:  None   WEIGHT BEARING RESTRICTIONS: No  FALLS:  Has patient fallen in last 6 months? Yes. Number of falls 2 fall. Patient had two surgeries two weeks from each other due to falling. She  fell while she was at the grocery store. She was turning and her husband said  her feet did not move with her. PT to address balance   LIVING ENVIRONMENT: Lives with: lives with their spouse Lives in: House/apartment Stairs: Yes: Internal: 12 steps; on left going up Has following equipment at home: Single point cane and Walker - 2 wheeled  OCCUPATION: Retired  PLOF: Independent, Independent with basic ADLs, Independent with household mobility without device, Independent with community mobility without device, Independent with gait, and Independent with transfers  PATIENT GOALS: use of my arm again. To get back to water  aerobics and drive  NEXT MD VISIT: September 30th   OBJECTIVE:  Note: Objective measures were completed at Evaluation unless otherwise noted.   PATIENT SURVEYS :  QuickDASH : 52.3/100 52.3%  12/01/23 ODI: 20/50= 40%  COGNITION: Overall cognitive status: Within functional limits for tasks assessed     SENSATION: WFL  POSTURE: Rounded shoulder & forward head Standing posture/ gait: right pelvic shift and left trunk lean  UPPER EXTREMITY ROM:   Passive ROM Right eval Left eval Left 11/17/23  Shoulder flexion  90 150  Shoulder extension  NT due to restrictions   Shoulder abduction  75 85  Shoulder adduction     Shoulder internal rotation  NT due to restrictions   Shoulder external rotation  NT due to restrictions 22  Elbow flexion     Elbow extension     Wrist flexion     Wrist extension     Wrist ulnar deviation     Wrist radial deviation     Wrist pronation     Wrist supination     (Blank rows = not tested)  11/17/23 seated scaption  5 deg, flex 68 deg  UPPER EXTREMITY MMT: NT due to weight restrictions of 1lb  LUMBAR ROM: All WFL  LOWER EXTREMITY MMT: significant proximal hip weakness, quad 4-/5,  DF 4-/5   LOWER EXTREMITY ROM:  FUNCTIONAL TESTING Initial eval: 5STS: 17.47 sec no UE support TUG:14.32 sec  12/01/23: 5STS: 19.78 sec  no UE support TUG:12.06 sec SLS: severe trendelenburg but able to hold approx 2-3 sec Tandem stance: needs help to get into position but able to hold 2-3 sec   JOINT MOBILITY TESTING:  See PROM measurement above Open end feel                                                                                                                              TREATMENT DATE:  12/13/23 UBE L2 3x3 fwd/bwd PT present to discuss status Mechanics for Lt shoulder reaching to curl hair - new strategy to try is more shoulder scaption/flexion with bent elbow vs abd with bent elbow Standing L to stretch Lt shoulder for flexion and lumbar spine x6 Rt lateral step up 2 x8 reps with 5 sec hold - some Rt knee pain with this but report of good feeling of work in glut med Seated clam red loop x 20 Sit to stand with red loop chair + pad  x8 - some Rt knee pain Seated march red loop at thighs x20 - PT cued maintain Rt knee and ankle in sagittal plane with hip Seated knee flexion red loop at ankles x10 each Standing at barre: hip abd x10 each cues for toe at wall (R) and no hip drop R when standing on R Ranger flexion and scaption x15 each Seated ADL training to coordinate positioning for curling hair  12/06/23 Nustep L1 x 2 min L 3 x 3 min Standing hip ABD 2 x 10 cues for toe at wall (R) and no hip drop R when standing on R. Standing hip ext with bent knee 2x10 - cue for no hip drop Standing march unable to maintain even pelvis Step ups fwd 4 inch too difficult Seated clam red loop x 20 - no difficulty S/L clam red loop 2x10 B  Bridge with red loop 2x 10 SL Lt shoulder abd x10, then with 2# x 10 Attempted S/L ER - unable Supine ER/IR with 1# x 20 watch for elbow extension Supine biceps 1# x 10   12/01/23: Initial eval for lumbar spine Educated patient on severe postural issues, need to walk on a regular basis, would suggest rollator walker for safety (educated on why and how being more sedentary due to  unsafe gait would lead to further decline), home safety education   PATIENT EDUCATION: Education details: PT eval findings, anticipated POC, progress with PT, and initial HEP Person educated: Patient Education method: Explanation, Demonstration, and Handouts Education comprehension: verbalized understanding, returned demonstration, and needs further education  HOME EXERCISE PROGRAM: Access Code: [email or web address] URL: [email or web address] Date: 12/06/2023 Prepared by: Mliss  Exercises - Supine Shoulder Flexion with Dowel  - 1 x daily - 7 x weekly - 1-2 sets - 10 reps - Seated Scapular Retraction  - 1 x daily - 7 x weekly - 1 sets - 10 reps - Isometric Shoulder Flexion  - 1 x daily - 7 x weekly - 2 sets - 5 reps - 5 hold - Standing Isometric Shoulder Abduction with Doorway - Arm Bent  - 1 x daily - 7 x weekly - 2 sets - 5 reps - 5 hold - Seated Shoulder Flexion AAROM with Pulley Behind  - 1 x daily - 7 x weekly - 2 sets - 10 reps - Seated Shoulder Scaption AAROM with Pulley at Side  - 1 x daily - 7 x weekly - 2 sets - 10 reps - Standing Hip Abduction with Counter Support  - 1 x daily - 7 x weekly - 2 sets - 10 reps - Standing Hip Extension with Leg Bent and Support  - 1 x daily - 7 x weekly - 2 sets - 10 reps  ASSESSMENT:  CLINICAL IMPRESSION: Pt does well with core and LE strength in sitting.  She is quick to need seated break with standing therex targeting weakness in Rt hip and thigh, and will get increased pain. She may benefit from several aquatic PT visits to learn standing strength program and focus on more seated program for land-based strength.  She continues to have stiffness and weakness in getting Lt UE elevated to enable her to curl her hair.  We worked on coming more into the scaption plane for this with assistance as needed from Rt UE to position arm today.  She will benefit from ongoing PT to address pain and functional goals.  OBJECTIVE IMPAIRMENTS: Abnormal gait,  decreased balance, decreased ROM, decreased strength, hypomobility, increased muscle spasms, impaired  flexibility, impaired UE functional use, postural dysfunction, and pain.   ACTIVITY LIMITATIONS: carrying, lifting, bending, squatting, stairs, bathing, dressing, reach over head, and hygiene/grooming  PARTICIPATION LIMITATIONS: meal prep, cleaning, laundry, interpersonal relationship, driving, shopping, and community activity  PERSONAL FACTORS: Age and 3+ comorbidities: falls risk HTN; anxiety; depression are also affecting patient's functional outcome.   REHAB POTENTIAL: Good  CLINICAL DECISION MAKING: Evolving/moderate complexity  EVALUATION COMPLEXITY: Moderate  GOALS: Goals reviewed with patient? Yes  SHORT TERM GOALS: Target date: 11/22/2023   Patient will be independent with initial HEP. Baseline:  Goal status: In Progress  2.  Patient will report > or = to 40% use of Lt UE with light home and self care tasks due to improved ROM and strength. Baseline:  Goal status: In Progress  3.  Patient will demonstrate correct seated and standing posture to reduce mechanical strain. Baseline:  Goal status: INITIAL  4.  .  Patient will demonstrate > or = to 100 degrees in Lt  shoulder P/ROM for improved reaching. Baseline:  Goal status: MET 150 deg 11/17/23  5.  Patient to report ability to ambulate for at least 5 min without need to sit.  Baseline:  Goal status: Initial   6. Patient to obtain proper assistive device to reduce fall risk  Baseline: not using anything  Goals status: Initial    LONG TERM GOALS: Target date: 12/20/2023  Patient will demonstrate independence in advanced HEP. Baseline:  Goal status: INITIAL  2.  .  Patient will report > or = to 70% of normal use of Rt UE for ADLs due to improved functional strength and A/ROM. Baseline:  Goal status: INITIAL  3.  Patient will be able to return to return water  aerobics program with no difficulty to establish  normal exercise routine Baseline:  Goal status: INITIAL   4.  Patient will be able to wash dishes and fold clothes with < or = to 2/10 left shoulder pain to for improved functional use of extremity. Baseline:  Goal status: INITIAL  5.  Patient will demonstrate > or = to 115 degrees in Rt shoulder A/ROM for improved reaching overhead. Baseline:  Goal status: INITIAL  6.  Patient's Annitta will be 64/100 for improved functional use of Lt UE and decreased disability.  Baseline: 52.3/100 Goal status: INITIAL  7. ODI to improve to 16/50  Baseline: 20/50  Goal status: Initial  8. Patient to be able to stand or walk for at least 15 min without rest  Baseline:   Goal status: Initial  PLAN: PT FREQUENCY: 1-2x/week  PT DURATION: 8 weeks  PLANNED INTERVENTIONS: 97164- PT Re-evaluation, 97110-Therapeutic exercises, 97530- Therapeutic activity, 97112- Neuromuscular re-education, 97535- Self Care, [id number]- Manual therapy, [phone]- Gait training, [phone]- Canalith repositioning, [phone]- Aquatic Therapy, [phone]- Electrical stimulation (unattended), [phone]- Electrical stimulation (manual), [id number]- Vasopneumatic device, [id number]- Ultrasound, [id number]- Ionotophoresis 4mg /ml Dexamethasone , 79439 (1-2 muscles), [id number] (3+ muscles)- Dry Needling, Patient/Family education, Balance training, Stair training, Taping, Joint mobilization, Joint manipulation, Spinal manipulation, Spinal mobilization, Scar mobilization, Vestibular training, Cryotherapy, and Moist heat  PLAN FOR NEXT SESSION: needs new auth in next visit or two, would benefit from 2-3 aquatic PT visits to learn core/LE strength given signif knee and hip pain with standing WB exercise, will do well with seated land based HEP for core/trunk and LE strength, work on Lt shoulder reaching to top of head to enable her to curl hair, Begin proximal hip strengthening, core strengthening, LE flexibility and continue shoulder rehab   [place]  Garvin Ellena, PT 12/13/23 12:07  PM  [facility] [address] [place], [place] [address] Phone # [phone] Fax [phone]

## 2023-12-15 ENCOUNTER — Ambulatory Visit

## 2023-12-15 DIAGNOSIS — R293 Abnormal posture: Secondary | ICD-10-CM

## 2023-12-15 DIAGNOSIS — M25612 Stiffness of left shoulder, not elsewhere classified: Secondary | ICD-10-CM | POA: Diagnosis not present

## 2023-12-15 DIAGNOSIS — M5459 Other low back pain: Secondary | ICD-10-CM

## 2023-12-15 DIAGNOSIS — M25621 Stiffness of right elbow, not elsewhere classified: Secondary | ICD-10-CM | POA: Diagnosis not present

## 2023-12-15 DIAGNOSIS — G8929 Other chronic pain: Secondary | ICD-10-CM

## 2023-12-15 DIAGNOSIS — R2681 Unsteadiness on feet: Secondary | ICD-10-CM

## 2023-12-15 DIAGNOSIS — M47816 Spondylosis without myelopathy or radiculopathy, lumbar region: Secondary | ICD-10-CM

## 2023-12-15 DIAGNOSIS — M6281 Muscle weakness (generalized): Secondary | ICD-10-CM

## 2023-12-15 DIAGNOSIS — R262 Difficulty in walking, not elsewhere classified: Secondary | ICD-10-CM

## 2023-12-15 DIAGNOSIS — R252 Cramp and spasm: Secondary | ICD-10-CM

## 2023-12-15 DIAGNOSIS — M25521 Pain in right elbow: Secondary | ICD-10-CM | POA: Diagnosis not present

## 2023-12-15 DIAGNOSIS — M25611 Stiffness of right shoulder, not elsewhere classified: Secondary | ICD-10-CM | POA: Diagnosis not present

## 2023-12-15 DIAGNOSIS — M25512 Pain in left shoulder: Secondary | ICD-10-CM | POA: Diagnosis not present

## 2023-12-15 NOTE — Therapy (Addendum)
 OUTPATIENT PHYSICAL THERAPY UPPER EXTREMITY and LUMBAR TREATMENT NOTE    Patient Name: Michelle Johnston MRN: [id number] DOB:01/11/1951, 73 y.o., female Today's Date: 12/15/2023  END OF SESSION:  PT End of Session - 12/15/23 1104     Visit Number 13    Number of Visits 16    Date for Recertification  02/09/24    Authorization Type 16 visits 10/25/23 to 12/20/23    Authorization - Visit Number 13    Authorization - Number of Visits 16    Progress Note Due on Visit 20    PT Start Time 1105    PT Stop Time 1150    PT Time Calculation (min) 45 min    Activity Tolerance Patient tolerated treatment well    Behavior During Therapy [facility] for tasks assessed/performed                   Past Medical History:  Diagnosis Date   Anxiety    Arthritis    Atrial fibrillation (HCC)    Bradycardia    Bronchitis    Depression    Dyspnea    GERD (gastroesophageal reflux disease)    Glaucoma    Hypertension    Presence of Watchman left atrial appendage closure device 08/07/2021   Watchman FLX 27mm with Dr. Cindie   Seasonal allergies    Sleep apnea    does not use CPAP   Wears glasses    Past Surgical History:  Procedure Laterality Date   ABDOMINAL HYSTERECTOMY  1990   ATRIAL FIBRILLATION ABLATION N/A 08/12/2020   Procedure: ATRIAL FIBRILLATION ABLATION;  Surgeon: Cindie Ole DASEN, MD;  Location: [facility];  Service: Cardiovascular;  Laterality: N/A;   ATRIAL FIBRILLATION ABLATION N/A 03/26/2023   Procedure: ATRIAL FIBRILLATION ABLATION;  Surgeon: Cindie Ole DASEN, MD;  Location: [facility];  Service: Cardiovascular;  Laterality: N/A;   BREAST BIOPSY Right    benign   CARDIOVERSION N/A 08/15/2019   Procedure: CARDIOVERSION;  Surgeon: Elmira Newman PARAS, MD;  Location: [facility];  Service: Cardiovascular;  Laterality: N/A;   COLONOSCOPY     LEFT ATRIAL APPENDAGE OCCLUSION N/A 08/07/2021   Procedure: LEFT ATRIAL APPENDAGE OCCLUSION;  Surgeon: Cindie Ole DASEN, MD;  Location: [facility];  Service: Cardiovascular;  Laterality: N/A;   meniscus tear knee     left knee   NASAL SINUS SURGERY  1975   ORIF ANKLE FRACTURE  2009   left   ORIF HUMERUS FRACTURE Right 07/15/2023   Procedure: OPEN REDUCTION INTERNAL FIXATION (ORIF) DISTAL HUMERUS FRACTURE;  Surgeon: Cristy Bonner DASEN, MD;  Location: [facility];  Service: Orthopedics;  Laterality: Right;   REVERSE SHOULDER ARTHROPLASTY Left 09/02/2023   Procedure: ARTHROPLASTY, SHOULDER, TOTAL, REVERSE;  Surgeon: Cristy Bonner DASEN, MD;  Location: [facility];  Service: Orthopedics;  Laterality: Left;   TEE WITHOUT CARDIOVERSION N/A 08/07/2021   Procedure: TRANSESOPHAGEAL ECHOCARDIOGRAM (TEE);  Surgeon: Cindie Ole DASEN, MD;  Location: [facility];  Service: Cardiovascular;  Laterality: N/A;   TOTAL KNEE ARTHROPLASTY Left 02/03/2019   Procedure: LEFT TOTAL KNEE ARTHROPLASTY;  Surgeon: Vernetta Lonni GRADE, MD;  Location: [facility];  Service: Orthopedics;  Laterality: Left;   TURBINATE REDUCTION Bilateral 06/04/2014   Procedure: BILATERAL TURBINATE REDUCTION;  Surgeon: Daniel Moccasin, MD;  Location: [facility];  Service: ENT;  Laterality: Bilateral;   TYMPANOSTOMY TUBE PLACEMENT     left   Patient Active Problem List   Diagnosis  Date Noted   Near syncope 09/11/2023   Impacted cerumen of left ear 08/15/2023   Other specified disorders of eustachian tube, left ear 02/06/2023   Central perforation of tympanic membrane of left ear 02/06/2023   Presence of Watchman left atrial appendage closure device 08/07/2021   Atrial fibrillation (HCC) 08/07/2021   OSA (obstructive sleep apnea) 10/29/2020   Persistent atrial fibrillation (HCC) 03/27/2020   Hypercoagulable state due to persistent atrial fibrillation (HCC) 03/27/2020   PAD (peripheral artery disease) 12/27/2019   Exertional chest pain 12/27/2019   Primary osteoarthritis of right knee 07/12/2019   Paroxysmal atrial fibrillation  (HCC) 06/16/2019   Nonrheumatic aortic valve insufficiency 06/16/2019   Nonrheumatic mitral valve regurgitation 06/16/2019   Tachycardia 02/20/2019   Abnormal EKG 02/20/2019   Essential hypertension 02/20/2019   Status post total left knee replacement 02/03/2019   Unilateral primary osteoarthritis, left knee 11/30/2016   Chronic pain of left knee 11/30/2016    PCP:   Loreli Kins, MD    REFERRING PROVIDER: Cristy Bonner DASEN, MD/ Margeret Kotyk, MD  REFERRING DIAG: left total shoulder arthoplasty ORIF tuberosities/ lumbar stenosis  THERAPY DIAG:  Other low back pain - Plan: PT plan of care cert/re-cert  Chronic left shoulder pain - Plan: PT plan of care cert/re-cert  Lumbar spondylosis - Plan: PT plan of care cert/re-cert  Muscle weakness (generalized) - Plan: PT plan of care cert/re-cert  Abnormal posture - Plan: PT plan of care cert/re-cert  Stiffness of left shoulder, not elsewhere classified - Plan: PT plan of care cert/re-cert  Cramp and spasm - Plan: PT plan of care cert/re-cert  Difficulty in walking, not elsewhere classified - Plan: PT plan of care cert/re-cert  Unsteadiness on feet - Plan: PT plan of care cert/re-cert  Rationale for Evaluation and Treatment: Rehabilitation  ONSET DATE: Surgery 09/02/2023  SUBJECTIVE:                                                                                                                                                                                      SUBJECTIVE STATEMENT: Patient reports she is doing fairly well with the left shoulder, pain 2/10 but her low back and hip remain painful.  She had ESI last week and this helped with the back pain.  Her hip and knee are still pretty painful at around 5/10.   Hand dominance: Right  PERTINENT HISTORY: Anxiety; OA; Depression; HTN; Hx left TKA  PAIN:  12/15/23 Are you having pain? Yes: NPRS scale: 2/ 10 shoulder, 5/10 lumbar  Pain location: left shoulder Pain  description: dull;achy Aggravating factors: folding clothes Relieving factors: Ice; Tylenol    PRECAUTIONS: Shoulder and Fall see  protocol in media  RED FLAGS:  None   WEIGHT BEARING RESTRICTIONS: No  FALLS:  Has patient fallen in last 6 months? Yes. Number of falls 2 fall. Patient had two surgeries two weeks from each other due to falling. She fell while she was at the grocery store. She was turning and her husband said her feet did not move with her. PT to address balance   LIVING ENVIRONMENT: Lives with: lives with their spouse Lives in: House/apartment Stairs: Yes: Internal: 12 steps; on left going up Has following equipment at home: Single point cane and Walker - 2 wheeled  OCCUPATION: Retired  PLOF: Independent, Independent with basic ADLs, Independent with household mobility without device, Independent with community mobility without device, Independent with gait, and Independent with transfers  PATIENT GOALS: use of my arm again. To get back to water  aerobics and drive  NEXT MD VISIT: September 30th   OBJECTIVE:  Note: Objective measures were completed at Evaluation unless otherwise noted.   PATIENT SURVEYS :  Initial eval QuickDASH : 52.3/100 52.3%  12/15/23: QuickDASH : 34.1/100 34.2%   12/01/23 ODI: 20/50= 40%  12/15/23 ODI: 21/50= 42%  COGNITION: Overall cognitive status: Within functional limits for tasks assessed     SENSATION: WFL  POSTURE: Rounded shoulder & forward head Standing posture/ gait: right pelvic shift and left trunk lean  UPPER EXTREMITY ROM:   Passive ROM Right eval Left eval Left 11/17/23 Left 1015/25  Shoulder flexion  90 150 160  Shoulder extension  NT due to restrictions    Shoulder abduction  75 85 120  Shoulder adduction      Shoulder internal rotation  NT due to restrictions  70  Shoulder external rotation  NT due to restrictions 22 60  Elbow flexion      Elbow extension      Wrist flexion      Wrist extension       Wrist ulnar deviation      Wrist radial deviation      Wrist pronation      Wrist supination      (Blank rows = not tested)  11/17/23 seated scaption  5 deg, flex 68 deg  UPPER EXTREMITY MMT: NT due to weight restrictions of 1lb  LUMBAR ROM: All WFL  LOWER EXTREMITY MMT: significant proximal hip weakness, quad 4-/5,  DF 4-/5    LOWER EXTREMITY ROM:  FUNCTIONAL TESTING Initial eval: 5STS: 17.47 sec no UE support TUG:14.32 sec  12/01/23: 5STS: 19.78 sec no UE support TUG:12.06 sec SLS: severe trendelenburg but able to hold approx 2-3 sec Tandem stance: needs help to get into position but able to hold 2-3 sec   12/15/23 5STS: 21.96 sec no UE support TUG:16.40 sec with cane  JOINT MOBILITY TESTING:  See PROM measurement above Open end feel  TREATMENT DATE:  12/15/23 Re-assessment visit for shoulder and low back Reviewed HEP and instructed in 2 new exercises for hip strength  12/13/23 UBE L2 3x3 fwd/bwd PT present to discuss status Mechanics for Lt shoulder reaching to curl hair - new strategy to try is more shoulder scaption/flexion with bent elbow vs abd with bent elbow Standing L to stretch Lt shoulder for flexion and lumbar spine x6 Rt lateral step up 2 x8 reps with 5 sec hold - some Rt knee pain with this but report of good feeling of work in glut med Seated clam red loop x 20 Sit to stand with red loop chair + pad x8 - some Rt knee pain Seated march red loop at thighs x20 - PT cued maintain Rt knee and ankle in sagittal plane with hip Seated knee flexion red loop at ankles x10 each Standing at barre: hip abd x10 each cues for toe at wall (R) and no hip drop R when standing on R Ranger flexion and scaption x15 each Seated ADL training to coordinate positioning for curling hair  12/06/23 Nustep L1 x 2 min L 3 x 3 min Standing hip ABD 2  x 10 cues for toe at wall (R) and no hip drop R when standing on R. Standing hip ext with bent knee 2x10 - cue for no hip drop Standing march unable to maintain even pelvis Step ups fwd 4 inch too difficult Seated clam red loop x 20 - no difficulty S/L clam red loop 2x10 B  Bridge with red loop 2x 10 SL Lt shoulder abd x10, then with 2# x 10 Attempted S/L ER - unable Supine ER/IR with 1# x 20 watch for elbow extension Supine biceps 1# x 10   12/01/23: Initial eval for lumbar spine Educated patient on severe postural issues, need to walk on a regular basis, would suggest rollator walker for safety (educated on why and how being more sedentary due to unsafe gait would lead to further decline), home safety education   PATIENT EDUCATION: Education details: PT eval findings, anticipated POC, progress with PT, and initial HEP Person educated: Patient Education method: Explanation, Demonstration, and Handouts Education comprehension: verbalized understanding, returned demonstration, and needs further education  HOME EXERCISE PROGRAM: Access Code: [email or web address] URL: [email or web address] Date: 12/15/2023 Prepared by: Delon Haddock  Exercises - Supine Shoulder Flexion with Dowel  - 1 x daily - 7 x weekly - 1-2 sets - 10 reps - Seated Scapular Retraction  - 1 x daily - 7 x weekly - 1 sets - 10 reps - Isometric Shoulder Flexion  - 1 x daily - 7 x weekly - 2 sets - 5 reps - 5 hold - Standing Isometric Shoulder Abduction with Doorway - Arm Bent  - 1 x daily - 7 x weekly - 2 sets - 5 reps - 5 hold - Seated Shoulder Flexion AAROM with Pulley Behind  - 1 x daily - 7 x weekly - 2 sets - 10 reps - Seated Shoulder Scaption AAROM with Pulley at Side  - 1 x daily - 7 x weekly - 2 sets - 10 reps - Standing Hip Abduction with Counter Support  - 1 x daily - 7 x weekly - 2 sets - 10 reps - Standing Hip Extension with Leg Bent and Support  - 1 x daily - 7 x weekly - 2 sets - 10 reps - Clam  -  1 x daily - 7 x weekly - 2 sets - 10 reps -  Sidelying Hip Abduction  - 1 x daily - 7 x weekly - 2 sets - 10 reps ASSESSMENT:  CLINICAL IMPRESSION: Jania shows significant improvement in objective findings for her shoulder.  She is using her left UE for reaching first shelf in her cabinets.  She is sleeping better. She is now using a cane and gait is much more symmetrical which should reduce her back pain and reduce fall risk.  She continues to have back pain but this is slightly improved.  She is well motivated and compliant with her HEP.   She will benefit from ongoing PT to address pain and functional goals.  OBJECTIVE IMPAIRMENTS: Abnormal gait, decreased balance, decreased ROM, decreased strength, hypomobility, increased muscle spasms, impaired flexibility, impaired UE functional use, postural dysfunction, and pain.   ACTIVITY LIMITATIONS: carrying, lifting, bending, squatting, stairs, bathing, dressing, reach over head, and hygiene/grooming  PARTICIPATION LIMITATIONS: meal prep, cleaning, laundry, interpersonal relationship, driving, shopping, and community activity  PERSONAL FACTORS: Age and 3+ comorbidities: falls risk HTN; anxiety; depression are also affecting patient's functional outcome.   REHAB POTENTIAL: Good  CLINICAL DECISION MAKING: Evolving/moderate complexity  EVALUATION COMPLEXITY: Moderate  GOALS: Goals reviewed with patient? Yes  SHORT TERM GOALS: Target date: 11/22/2023   Patient will be independent with initial HEP. Baseline:  Goal status: In Progress  2.  Patient will report > or = to 40% use of Lt UE with light home and self care tasks due to improved ROM and strength. Baseline:  Goal status: In Progress  3.  Patient will demonstrate correct seated and standing posture to reduce mechanical strain. Baseline:  Goal status: MET 12/15/23  4.  .  Patient will demonstrate > or = to 100 degrees in Lt  shoulder P/ROM for improved reaching. Baseline:  Goal  status: MET 150 deg 11/17/23  5.  Patient to report ability to ambulate for at least 5 min without need to sit.  Baseline:  Goal status: MET 12/15/23  6. Patient to obtain proper assistive device to reduce fall risk  Baseline: not using anything  Goals status: MET 12/15/23    LONG TERM GOALS: Target date: 02/09/2024  Patient will demonstrate independence in advanced HEP. Baseline:  Goal status: INITIAL  2.  .  Patient will report > or = to 70% of normal use of Rt UE for ADLs due to improved functional strength and A/ROM. Baseline:  Goal status: INITIAL  3.  Patient will be able to return to return water  aerobics program with no difficulty to establish normal exercise routine Baseline:  Goal status: INITIAL   4.  Patient will be able to wash dishes and fold clothes with < or = to 2/10 left shoulder pain to for improved functional use of extremity. Baseline:  Goal status: INITIAL  5.  Patient will demonstrate > or = to 115 degrees in Rt shoulder A/ROM for improved reaching overhead. Baseline:  Goal status: INITIAL  6.  Patient's Annitta will be 64/100 for improved functional use of Lt UE and decreased disability.  Baseline: 52.3/100 Goal status: INITIAL  7. ODI to improve to 16/50  Baseline: 20/50  Goal status: Initial  8. Patient to be able to stand or walk for at least 15 min without rest  Baseline:   Goal status: Initial  PLAN: PT FREQUENCY: 1-2x/week  PT DURATION: 8 weeks  PLANNED INTERVENTIONS: 97164- PT Re-evaluation, 97110-Therapeutic exercises, 97530- Therapeutic activity, [id number]- Neuromuscular re-education, 97535- Self Care, 02859- Manual therapy, [id number]- Gait training, [id number]- Canalith repositioning, [id number]-  Aquatic Therapy, [phone]- Electrical stimulation (unattended), [phone]- Electrical stimulation (manual), 02983- Vasopneumatic device, [id number]- Ultrasound, [id number]- Ionotophoresis 4mg /ml Dexamethasone , 20560 (1-2 muscles), 20561 (3+ muscles)- Dry Needling,  Patient/Family education, Balance training, Stair training, Taping, Joint mobilization, Joint manipulation, Spinal manipulation, Spinal mobilization, Scar mobilization, Vestibular training, Cryotherapy, and Moist heat  PLAN FOR NEXT SESSION: Patient needs to schedule and  would benefit from 2-3 aquatic PT visits to learn core/LE strength given signif knee and hip pain with standing WB exercise, will do well with seated land based HEP for core/trunk and LE strength, work on Lt shoulder reaching to top of head to enable her to curl hair, progress proximal hip strengthening, core strengthening, LE flexibility and continue shoulder rehab   Delainy Mcelhiney B. Icie Kuznicki, PT 12/15/23 1:06 PM [facility] [address] [place], [place] [address] Phone # [phone] Fax [phone]

## 2023-12-15 NOTE — Addendum Note (Signed)
 Addended by: HARVEY NEST B on: 12/15/2023 01:06 PM   Modules accepted: Orders

## 2023-12-20 ENCOUNTER — Encounter: Payer: Self-pay | Admitting: Physical Therapy

## 2023-12-20 ENCOUNTER — Ambulatory Visit: Admitting: Physical Therapy

## 2023-12-20 DIAGNOSIS — R262 Difficulty in walking, not elsewhere classified: Secondary | ICD-10-CM

## 2023-12-20 DIAGNOSIS — M6281 Muscle weakness (generalized): Secondary | ICD-10-CM | POA: Diagnosis not present

## 2023-12-20 DIAGNOSIS — M25512 Pain in left shoulder: Secondary | ICD-10-CM | POA: Diagnosis not present

## 2023-12-20 DIAGNOSIS — G8929 Other chronic pain: Secondary | ICD-10-CM | POA: Diagnosis not present

## 2023-12-20 DIAGNOSIS — M5459 Other low back pain: Secondary | ICD-10-CM | POA: Diagnosis not present

## 2023-12-20 DIAGNOSIS — R293 Abnormal posture: Secondary | ICD-10-CM | POA: Diagnosis not present

## 2023-12-20 DIAGNOSIS — R2681 Unsteadiness on feet: Secondary | ICD-10-CM

## 2023-12-20 DIAGNOSIS — M25612 Stiffness of left shoulder, not elsewhere classified: Secondary | ICD-10-CM | POA: Diagnosis not present

## 2023-12-20 DIAGNOSIS — R252 Cramp and spasm: Secondary | ICD-10-CM

## 2023-12-20 DIAGNOSIS — M25521 Pain in right elbow: Secondary | ICD-10-CM | POA: Diagnosis not present

## 2023-12-20 DIAGNOSIS — M25621 Stiffness of right elbow, not elsewhere classified: Secondary | ICD-10-CM | POA: Diagnosis not present

## 2023-12-20 DIAGNOSIS — M47816 Spondylosis without myelopathy or radiculopathy, lumbar region: Secondary | ICD-10-CM

## 2023-12-20 DIAGNOSIS — M25611 Stiffness of right shoulder, not elsewhere classified: Secondary | ICD-10-CM | POA: Diagnosis not present

## 2023-12-20 NOTE — Therapy (Signed)
 OUTPATIENT PHYSICAL THERAPY UPPER EXTREMITY and LUMBAR TREATMENT NOTE    Patient Name: Michelle Johnston MRN: [id number] DOB:1950/08/24, 73 y.o., female Today's Date: 12/20/2023  END OF SESSION:  PT End of Session - 12/20/23 1105     Visit Number 14    Number of Visits 16    Authorization Type 16 visits 10/25/23 to 12/20/23 -  new auth starts next time: [employer] approved 12 visits 12/21/23-03/20/24    Authorization - Visit Number 14    Authorization - Number of Visits 16    Progress Note Due on Visit 20    PT Start Time 1100    PT Stop Time 1145    PT Time Calculation (min) 45 min    Activity Tolerance Patient tolerated treatment well    Behavior During Therapy [facility] for tasks assessed/performed                    Past Medical History:  Diagnosis Date   Anxiety    Arthritis    Atrial fibrillation (HCC)    Bradycardia    Bronchitis    Depression    Dyspnea    GERD (gastroesophageal reflux disease)    Glaucoma    Hypertension    Presence of Watchman left atrial appendage closure device 08/07/2021   Watchman FLX 27mm with Dr. Cindie   Seasonal allergies    Sleep apnea    does not use CPAP   Wears glasses    Past Surgical History:  Procedure Laterality Date   ABDOMINAL HYSTERECTOMY  1990   ATRIAL FIBRILLATION ABLATION N/A 08/12/2020   Procedure: ATRIAL FIBRILLATION ABLATION;  Surgeon: Cindie Ole DASEN, MD;  Location: [facility];  Service: Cardiovascular;  Laterality: N/A;   ATRIAL FIBRILLATION ABLATION N/A 03/26/2023   Procedure: ATRIAL FIBRILLATION ABLATION;  Surgeon: Cindie Ole DASEN, MD;  Location: [facility];  Service: Cardiovascular;  Laterality: N/A;   BREAST BIOPSY Right    benign   CARDIOVERSION N/A 08/15/2019   Procedure: CARDIOVERSION;  Surgeon: Elmira Newman PARAS, MD;  Location: [facility];  Service: Cardiovascular;  Laterality: N/A;   COLONOSCOPY     LEFT ATRIAL APPENDAGE OCCLUSION N/A 08/07/2021   Procedure: LEFT ATRIAL APPENDAGE  OCCLUSION;  Surgeon: Cindie Ole DASEN, MD;  Location: [facility];  Service: Cardiovascular;  Laterality: N/A;   meniscus tear knee     left knee   NASAL SINUS SURGERY  1975   ORIF ANKLE FRACTURE  2009   left   ORIF HUMERUS FRACTURE Right 07/15/2023   Procedure: OPEN REDUCTION INTERNAL FIXATION (ORIF) DISTAL HUMERUS FRACTURE;  Surgeon: Cristy Bonner DASEN, MD;  Location: [facility];  Service: Orthopedics;  Laterality: Right;   REVERSE SHOULDER ARTHROPLASTY Left 09/02/2023   Procedure: ARTHROPLASTY, SHOULDER, TOTAL, REVERSE;  Surgeon: Cristy Bonner DASEN, MD;  Location: [facility];  Service: Orthopedics;  Laterality: Left;   TEE WITHOUT CARDIOVERSION N/A 08/07/2021   Procedure: TRANSESOPHAGEAL ECHOCARDIOGRAM (TEE);  Surgeon: Cindie Ole DASEN, MD;  Location: [facility] INV[facility]IVE CV [facility];  Service: Cardiovascular;  Laterality: N/A;   TOTAL KNEE ARTHROPLASTY Left 02/03/2019   Procedure: LEFT TOTAL KNEE ARTHROPLASTY;  Surgeon: Vernetta Lonni GRADE, MD;  Location: [facility];  Service: Orthopedics;  Laterality: Left;   TURBINATE REDUCTION Bilateral 06/04/2014   Procedure: BILATERAL TURBINATE REDUCTION;  Surgeon: Daniel Moccasin, MD;  Location: [facility];  Service: ENT;  Laterality: Bilateral;   TYMPANOSTOMY TUBE PLACEMENT     left   Patient Active  Problem List   Diagnosis Date Noted   Near syncope 09/11/2023   Impacted cerumen of left ear 08/15/2023   Other specified disorders of eustachian tube, left ear 02/06/2023   Central perforation of tympanic membrane of left ear 02/06/2023   Presence of Watchman left atrial appendage closure device 08/07/2021   Atrial fibrillation (HCC) 08/07/2021   OSA (obstructive sleep apnea) 10/29/2020   Persistent atrial fibrillation (HCC) 03/27/2020   Hypercoagulable state due to persistent atrial fibrillation (HCC) 03/27/2020   PAD (peripheral artery disease) 12/27/2019   Exertional chest pain 12/27/2019   Primary osteoarthritis of right knee 07/12/2019    Paroxysmal atrial fibrillation (HCC) 06/16/2019   Nonrheumatic aortic valve insufficiency 06/16/2019   Nonrheumatic mitral valve regurgitation 06/16/2019   Tachycardia 02/20/2019   Abnormal EKG 02/20/2019   Essential hypertension 02/20/2019   Status post total left knee replacement 02/03/2019   Unilateral primary osteoarthritis, left knee 11/30/2016   Chronic pain of left knee 11/30/2016    PCP:   Loreli Kins, MD    REFERRING PROVIDER: Cristy Bonner DASEN, MD/ Margeret Kotyk, MD  REFERRING DIAG: left total shoulder arthoplasty ORIF tuberosities/ lumbar stenosis  THERAPY DIAG:  Other low back pain  Chronic left shoulder pain  Lumbar spondylosis  Muscle weakness (generalized)  Abnormal posture  Stiffness of left shoulder, not elsewhere classified  Cramp and spasm  Difficulty in walking, not elsewhere classified  Unsteadiness on feet  Rationale for Evaluation and Treatment: Rehabilitation  ONSET DATE: Surgery 09/02/2023  SUBJECTIVE:                                                                                                                                                                                      SUBJECTIVE STATEMENT: I continue to struggle with getting the Lt arm up to coordinate curling my hair.  I am going to get a hot brush to try instead.  Hand dominance: Right  PERTINENT HISTORY: Anxiety; OA; Depression; HTN; Hx left TKA  PAIN:  12/15/23 Are you having pain? Yes: NPRS scale: 2/ 10 shoulder, 5/10 lumbar  Pain location: left shoulder Pain description: dull;achy Aggravating factors: folding clothes Relieving factors: Ice; Tylenol    PRECAUTIONS: Shoulder and Fall see protocol in media  RED FLAGS:  None   WEIGHT BEARING RESTRICTIONS: No  FALLS:  Has patient fallen in last 6 months? Yes. Number of falls 2 fall. Patient had two surgeries two weeks from each other due to falling. She fell while she was at the grocery store. She was turning and  her husband said her feet did not move with her. PT to address balance   LIVING ENVIRONMENT: Lives with: lives with their  spouse Lives in: House/apartment Stairs: Yes: Internal: 12 steps; on left going up Has following equipment at home: Single point cane and Walker - 2 wheeled  OCCUPATION: Retired  PLOF: Independent, Independent with basic ADLs, Independent with household mobility without device, Independent with community mobility without device, Independent with gait, and Independent with transfers  PATIENT GOALS: use of my arm again. To get back to water  aerobics and drive  NEXT MD VISIT: September 30th   OBJECTIVE:  Note: Objective measures were completed at Evaluation unless otherwise noted.   PATIENT SURVEYS :  Initial eval QuickDASH : 52.3/100 52.3%  12/15/23: QuickDASH : 34.1/100 34.2%   12/01/23 ODI: 20/50= 40%  12/15/23 ODI: 21/50= 42%  COGNITION: Overall cognitive status: Within functional limits for tasks assessed     SENSATION: WFL  POSTURE: Rounded shoulder & forward head Standing posture/ gait: right pelvic shift and left trunk lean  UPPER EXTREMITY ROM:   Passive ROM Right eval Left eval Left 11/17/23 Left 1015/25  Shoulder flexion  90 150 160  Shoulder extension  NT due to restrictions    Shoulder abduction  75 85 120  Shoulder adduction      Shoulder internal rotation  NT due to restrictions  70  Shoulder external rotation  NT due to restrictions 22 60  Elbow flexion      Elbow extension      Wrist flexion      Wrist extension      Wrist ulnar deviation      Wrist radial deviation      Wrist pronation      Wrist supination      (Blank rows = not tested)  11/17/23 seated scaption  5 deg, flex 68 deg  UPPER EXTREMITY MMT: NT due to weight restrictions of 1lb  LUMBAR ROM: All WFL  LOWER EXTREMITY MMT: significant proximal hip weakness, quad 4-/5,  DF 4-/5    LOWER EXTREMITY ROM:  FUNCTIONAL TESTING Initial eval: 5STS:  17.47 sec no UE support TUG:14.32 sec  12/01/23: 5STS: 19.78 sec no UE support TUG:12.06 sec SLS: severe trendelenburg but able to hold approx 2-3 sec Tandem stance: needs help to get into position but able to hold 2-3 sec   12/15/23 5STS: 21.96 sec no UE support TUG:16.40 sec with cane  JOINT MOBILITY TESTING:  See PROM measurement above Open end feel                                                                                                                              TREATMENT DATE:  12/20/23 UBE 3x3 L2 Supine 2lb Lt shoulder flexion with bent elbow x10 Supine TA with exhale bent knee fall out x5 each Supine 2lb chest press with serratus punch Supine 90/90 heel touches x10 slow and x10 more rapid Supine 2lb Lt UE squares at 90 deg CW and CCW x 10 each Supine bridge 10x5 Supine Lt 2lb UE tricep press at 90 deg flexion  Chair sit up holding 2lb ball with chest press each rep x12 Seated clam with red loop 2x10 slow, then 2x10 quick pulses outer range Seated hamstring curl red loop at ankles 2x10 Seated red pallof x10 each way Sit to stand x10 chair + pad with hands on thighs  12/15/23 Re-assessment visit for shoulder and low back Reviewed HEP and instructed in 2 new exercises for hip strength  12/13/23 UBE L2 3x3 fwd/bwd PT present to discuss status Mechanics for Lt shoulder reaching to curl hair - new strategy to try is more shoulder scaption/flexion with bent elbow vs abd with bent elbow Standing L to stretch Lt shoulder for flexion and lumbar spine x6 Rt lateral step up 2 x8 reps with 5 sec hold - some Rt knee pain with this but report of good feeling of work in glut med Seated clam red loop x 20 Sit to stand with red loop chair + pad x8 - some Rt knee pain Seated march red loop at thighs x20 - PT cued maintain Rt knee and ankle in sagittal plane with hip Seated knee flexion red loop at ankles x10 each Standing at barre: hip abd x10 each cues for toe at wall  (R) and no hip drop R when standing on R Ranger flexion and scaption x15 each Seated ADL training to coordinate positioning for curling hair  12/06/23 Nustep L1 x 2 min L 3 x 3 min Standing hip ABD 2 x 10 cues for toe at wall (R) and no hip drop R when standing on R. Standing hip ext with bent knee 2x10 - cue for no hip drop Standing march unable to maintain even pelvis Step ups fwd 4 inch too difficult Seated clam red loop x 20 - no difficulty S/L clam red loop 2x10 B  Bridge with red loop 2x 10 SL Lt shoulder abd x10, then with 2# x 10 Attempted S/L ER - unable Supine ER/IR with 1# x 20 watch for elbow extension Supine biceps 1# x 10   12/01/23: Initial eval for lumbar spine Educated patient on severe postural issues, need to walk on a regular basis, would suggest rollator walker for safety (educated on why and how being more sedentary due to unsafe gait would lead to further decline), home safety education   PATIENT EDUCATION: Education details: PT eval findings, anticipated POC, progress with PT, and initial HEP Person educated: Patient Education method: Explanation, Demonstration, and Handouts Education comprehension: verbalized understanding, returned demonstration, and needs further education  HOME EXERCISE PROGRAM: Access Code: [email or web address] URL: [email or web address] Date: 12/15/2023 Prepared by: Delon Haddock  Exercises - Supine Shoulder Flexion with Dowel  - 1 x daily - 7 x weekly - 1-2 sets - 10 reps - Seated Scapular Retraction  - 1 x daily - 7 x weekly - 1 sets - 10 reps - Isometric Shoulder Flexion  - 1 x daily - 7 x weekly - 2 sets - 5 reps - 5 hold - Standing Isometric Shoulder Abduction with Doorway - Arm Bent  - 1 x daily - 7 x weekly - 2 sets - 5 reps - 5 hold - Seated Shoulder Flexion AAROM with Pulley Behind  - 1 x daily - 7 x weekly - 2 sets - 10 reps - Seated Shoulder Scaption AAROM with Pulley at Side  - 1 x daily - 7 x weekly - 2 sets - 10  reps - Standing Hip Abduction with Counter Support  - 1 x daily - 7 x weekly -  2 sets - 10 reps - Standing Hip Extension with Leg Bent and Support  - 1 x daily - 7 x weekly - 2 sets - 10 reps - Clam  - 1 x daily - 7 x weekly - 2 sets - 10 reps - Sidelying Hip Abduction  - 1 x daily - 7 x weekly - 2 sets - 10 reps ASSESSMENT:  CLINICAL IMPRESSION: Session focused on loading Lt shoulder in supine to work on strength for positioning arm for overhead ADLs such as curling her hair. She is also working on trunk, core and hip stabilization which she did very well with today as she was cued to overlay LE movement on TA indraw.  She needed cues to use cane simultaneously with Rt closed chain stance phase to control Trendelenburg more effectively. She is possibly interested in DN for lumbar and hip pain which is worst at the end of the day.  OBJECTIVE IMPAIRMENTS: Abnormal gait, decreased balance, decreased ROM, decreased strength, hypomobility, increased muscle spasms, impaired flexibility, impaired UE functional use, postural dysfunction, and pain.   ACTIVITY LIMITATIONS: carrying, lifting, bending, squatting, stairs, bathing, dressing, reach over head, and hygiene/grooming  PARTICIPATION LIMITATIONS: meal prep, cleaning, laundry, interpersonal relationship, driving, shopping, and community activity  PERSONAL FACTORS: Age and 3+ comorbidities: falls risk HTN; anxiety; depression are also affecting patient's functional outcome.   REHAB POTENTIAL: Good  CLINICAL DECISION MAKING: Evolving/moderate complexity  EVALUATION COMPLEXITY: Moderate  GOALS: Goals reviewed with patient? Yes  SHORT TERM GOALS: Target date: 11/22/2023   Patient will be independent with initial HEP. Baseline:  Goal status: In Progress  2.  Patient will report > or = to 40% use of Lt UE with light home and self care tasks due to improved ROM and strength. Baseline:  Goal status: In Progress  3.  Patient will demonstrate  correct seated and standing posture to reduce mechanical strain. Baseline:  Goal status: MET 12/15/23  4.  .  Patient will demonstrate > or = to 100 degrees in Lt  shoulder P/ROM for improved reaching. Baseline:  Goal status: MET 150 deg 11/17/23  5.  Patient to report ability to ambulate for at least 5 min without need to sit.  Baseline:  Goal status: MET 12/15/23  6. Patient to obtain proper assistive device to reduce fall risk  Baseline: not using anything  Goals status: MET 12/15/23    LONG TERM GOALS: Target date: 02/09/2024  Patient will demonstrate independence in advanced HEP. Baseline:  Goal status: INITIAL  2.  .  Patient will report > or = to 70% of normal use of Rt UE for ADLs due to improved functional strength and A/ROM. Baseline:  Goal status: INITIAL  3.  Patient will be able to return to return water  aerobics program with no difficulty to establish normal exercise routine Baseline:  Goal status: INITIAL   4.  Patient will be able to wash dishes and fold clothes with < or = to 2/10 left shoulder pain to for improved functional use of extremity. Baseline:  Goal status: INITIAL  5.  Patient will demonstrate > or = to 115 degrees in Rt shoulder A/ROM for improved reaching overhead. Baseline:  Goal status: INITIAL  6.  Patient's Annitta will be 64/100 for improved functional use of Lt UE and decreased disability.  Baseline: 52.3/100 Goal status: INITIAL  7. ODI to improve to 16/50  Baseline: 20/50  Goal status: Initial  8. Patient to be able to stand or walk for  at least 15 min without rest  Baseline:   Goal status: Initial  PLAN: PT FREQUENCY: 1-2x/week  PT DURATION: 8 weeks  PLANNED INTERVENTIONS: [id number]- PT Re-evaluation, 97110-Therapeutic exercises, 97530- Therapeutic activity, 97112- Neuromuscular re-education, 97535- Self Care, [id number]- Manual therapy, [phone]- Gait training, [phone]- Canalith repositioning, [id number]- Aquatic Therapy, [phone]-  Electrical stimulation (unattended), [phone]- Electrical stimulation (manual), [id number]- Vasopneumatic device, [id number]- Ultrasound, [id number]- Ionotophoresis 4mg /ml Dexamethasone , 79439 (1-2 muscles), [id number] (3+ muscles)- Dry Needling, Patient/Family education, Balance training, Stair training, Taping, Joint mobilization, Joint manipulation, Spinal manipulation, Spinal mobilization, Scar mobilization, Vestibular training, Cryotherapy, and Moist heat  PLAN FOR NEXT SESSION: aquatic PT 2-3x to learn trunk/hip/core strength routine, land based: Lt shoulder ROM/strength, trunk/hip/core strength in supine, SL and seated   Orvil Fester, PT 12/20/23 11:51 AM  [facility] [address], [address] [place], [place] [address] Phone # [phone] Fax [phone]

## 2023-12-22 ENCOUNTER — Ambulatory Visit: Admitting: Physical Therapy

## 2023-12-30 DIAGNOSIS — M48061 Spinal stenosis, lumbar region without neurogenic claudication: Secondary | ICD-10-CM | POA: Diagnosis not present

## 2023-12-30 DIAGNOSIS — M7071 Other bursitis of hip, right hip: Secondary | ICD-10-CM | POA: Diagnosis not present

## 2023-12-31 ENCOUNTER — Ambulatory Visit (HOSPITAL_BASED_OUTPATIENT_CLINIC_OR_DEPARTMENT_OTHER): Attending: Orthopaedic Surgery | Admitting: Physical Therapy

## 2023-12-31 DIAGNOSIS — M6281 Muscle weakness (generalized): Secondary | ICD-10-CM | POA: Diagnosis not present

## 2023-12-31 DIAGNOSIS — M5459 Other low back pain: Secondary | ICD-10-CM | POA: Insufficient documentation

## 2023-12-31 DIAGNOSIS — G8929 Other chronic pain: Secondary | ICD-10-CM | POA: Diagnosis not present

## 2023-12-31 DIAGNOSIS — M47816 Spondylosis without myelopathy or radiculopathy, lumbar region: Secondary | ICD-10-CM | POA: Insufficient documentation

## 2023-12-31 DIAGNOSIS — M25512 Pain in left shoulder: Secondary | ICD-10-CM | POA: Insufficient documentation

## 2023-12-31 NOTE — Therapy (Signed)
 OUTPATIENT PHYSICAL THERAPY UPPER EXTREMITY and LUMBAR TREATMENT NOTE    Patient Name: Michelle Johnston MRN: [id number] DOB:09-Oct-1950, 73 y.o., female Today's Date: 12/31/2023  END OF SESSION:  PT End of Session - 12/31/23 1227     Visit Number 15    Date for Recertification  02/09/24    Authorization Type [employer] approved 12 visits 12/21/23-03/20/24    Authorization - Visit Number 1    Authorization - Number of Visits 12    Progress Note Due on Visit --    PT Start Time 1150    PT Stop Time 1230    PT Time Calculation (min) 40 min    Activity Tolerance Patient tolerated treatment well    Behavior During Therapy [facility] for tasks assessed/performed          Past Medical History:  Diagnosis Date   Anxiety    Arthritis    Atrial fibrillation (HCC)    Bradycardia    Bronchitis    Depression    Dyspnea    GERD (gastroesophageal reflux disease)    Glaucoma    Hypertension    Presence of Watchman left atrial appendage closure device 08/07/2021   Watchman FLX 27mm with Dr. Cindie   Seasonal allergies    Sleep apnea    does not use CPAP   Wears glasses    Past Surgical History:  Procedure Laterality Date   ABDOMINAL HYSTERECTOMY  1990   ATRIAL FIBRILLATION ABLATION N/A 08/12/2020   Procedure: ATRIAL FIBRILLATION ABLATION;  Surgeon: Cindie Ole DASEN, MD;  Location: [facility];  Service: Cardiovascular;  Laterality: N/A;   ATRIAL FIBRILLATION ABLATION N/A 03/26/2023   Procedure: ATRIAL FIBRILLATION ABLATION;  Surgeon: Cindie Ole DASEN, MD;  Location: [facility];  Service: Cardiovascular;  Laterality: N/A;   BREAST BIOPSY Right    benign   CARDIOVERSION N/A 08/15/2019   Procedure: CARDIOVERSION;  Surgeon: Elmira Newman PARAS, MD;  Location: [facility];  Service: Cardiovascular;  Laterality: N/A;   COLONOSCOPY     LEFT ATRIAL APPENDAGE OCCLUSION N/A 08/07/2021   Procedure: LEFT ATRIAL APPENDAGE OCCLUSION;  Surgeon: Cindie Ole DASEN, MD;  Location: [facility]  INVAS[facility];  Service: Cardiovascular;  Laterality: N/A;   meniscus tear knee     left knee   NASAL SINUS SURGERY  1975   ORIF ANKLE FRACTURE  2009   left   ORIF HUMERUS FRACTURE Right 07/15/2023   Procedure: OPEN REDUCTION INTERNAL FIXATION (ORIF) DISTAL HUMERUS FRACTURE;  Surgeon: Cristy Bonner DASEN, MD;  Location: [facility];  Service: Orthopedics;  Laterality: Right;   REVERSE SHOULDER ARTHROPLASTY Left 09/02/2023   Procedure: ARTHROPLASTY, SHOULDER, TOTAL, REVERSE;  Surgeon: Cristy Bonner DASEN, MD;  Location: [facility] OR;  Service: Orthopedics;  Laterality: Left;   TEE WITHOUT CARDIOVERSION N/A 08/07/2021   Procedure: TRANSESOPHAGEAL ECHOCARDIOGRAM (TEE);  Surgeon: Cindie Ole DASEN, MD;  [facility] CV [facility];  Service: Cardiovascular;  Laterality: N/A;   TOTAL KNEE ARTHROPLASTY Left 02/03/2019   Procedure: LEFT TOTAL KNEE ARTHROPLASTY;  Surgeon: Vernetta Lonni GRADE, MD;  Location: [facility] ORS;  Service: Orthopedics;  Laterality: Left;   TURBINATE REDUCTION Bilateral 06/04/2014   Procedure: BILATERAL TURBINATE REDUCTION;  Surgeon: Daniel Moccasin, MD;  Location: [facility];  Service: ENT;  Laterality: Bilateral;   TYMPANOSTOMY TUBE PLACEMENT     left   Patient Active Problem List   Diagnosis Date Noted   Near syncope 09/11/2023   Impacted cerumen of left ear 08/15/2023  Other specified disorders of eustachian tube, left ear 02/06/2023   Central perforation of tympanic membrane of left ear 02/06/2023   Presence of Watchman left atrial appendage closure device 08/07/2021   Atrial fibrillation (HCC) 08/07/2021   OSA (obstructive sleep apnea) 10/29/2020   Persistent atrial fibrillation (HCC) 03/27/2020   Hypercoagulable state due to persistent atrial fibrillation (HCC) 03/27/2020   PAD (peripheral artery disease) 12/27/2019   Exertional chest pain 12/27/2019   Primary osteoarthritis of right knee 07/12/2019   Paroxysmal atrial fibrillation (HCC) 06/16/2019   Nonrheumatic  aortic valve insufficiency 06/16/2019   Nonrheumatic mitral valve regurgitation 06/16/2019   Tachycardia 02/20/2019   Abnormal EKG 02/20/2019   Essential hypertension 02/20/2019   Status post total left knee replacement 02/03/2019   Unilateral primary osteoarthritis, left knee 11/30/2016   Chronic pain of left knee 11/30/2016    PCP:   Loreli Kins, MD    REFERRING PROVIDER: Cristy Bonner DASEN, MD/ Ibazebo, [place], MD  REFERRING DIAG: left total shoulder arthoplasty ORIF tuberosities/ lumbar stenosis  THERAPY DIAG:  Other low back pain  Chronic left shoulder pain  Lumbar spondylosis  Muscle weakness (generalized)  Rationale for Evaluation and Treatment: Rehabilitation  ONSET DATE: Surgery 09/02/2023  SUBJECTIVE:                                                                                                                                                                                      SUBJECTIVE STATEMENT: Pt reports she has done aquatic therapy in the past.  Would like to build strength in hip.  No falls since April and May.  Pt getting epidural injection on 11/13.   POOL ACCESS: GAC - goes to water  aerobics 1-2x/wk  Hand dominance: Right  PERTINENT HISTORY: Anxiety; OA; Depression; HTN; Hx left TKA  PAIN:   Are you having pain? Yes: NPRS scale: 3/10 R hip and back  Pain location: see above Pain description: dull;achy Aggravating factors: folding clothes Relieving factors: Ice; Tylenol    PRECAUTIONS: Shoulder and Fall see protocol in media  RED FLAGS:  None   WEIGHT BEARING RESTRICTIONS: No  FALLS:  Has patient fallen in last 6 months? Yes. Number of falls 2 fall. Patient had two surgeries two weeks from each other due to falling. She fell while she was at the grocery store. She was turning and her husband said her feet did not move with her. PT to address balance   LIVING ENVIRONMENT: Lives with: lives with their spouse Lives in:  House/apartment Stairs: Yes: Internal: 12 steps; on left going up Has following equipment at home: Single point cane and Walker - 2 wheeled  OCCUPATION: Retired  PLOF: Independent,  Independent with basic ADLs, Independent with household mobility without device, Independent with community mobility without device, Independent with gait, and Independent with transfers  PATIENT GOALS: use of my arm again. To get back to water  aerobics and drive  NEXT MD VISIT:   OBJECTIVE:  Note: Objective measures were completed at Evaluation unless otherwise noted.   PATIENT SURVEYS :  Initial eval QuickDASH : 52.3/100 52.3%  12/15/23: QuickDASH : 34.1/100 34.2%   12/01/23 ODI: 20/50= 40%  12/15/23 ODI: 21/50= 42%  COGNITION: Overall cognitive status: Within functional limits for tasks assessed     SENSATION: WFL  POSTURE: Rounded shoulder & forward head Standing posture/ gait: right pelvic shift and left trunk lean  UPPER EXTREMITY ROM:   Passive ROM Right eval Left eval Left 11/17/23 Left 1015/25  Shoulder flexion  90 150 160  Shoulder extension  NT due to restrictions    Shoulder abduction  75 85 120  Shoulder adduction      Shoulder internal rotation  NT due to restrictions  70  Shoulder external rotation  NT due to restrictions 22 60  Elbow flexion      Elbow extension      Wrist flexion      Wrist extension      Wrist ulnar deviation      Wrist radial deviation      Wrist pronation      Wrist supination      (Blank rows = not tested)  11/17/23 seated scaption  5 deg, flex 68 deg  UPPER EXTREMITY MMT: NT due to weight restrictions of 1lb  LUMBAR ROM: All WFL  LOWER EXTREMITY MMT: significant proximal hip weakness, quad 4-/5,  DF 4-/5    LOWER EXTREMITY ROM:  FUNCTIONAL TESTING Initial eval: 5STS: 17.47 sec no UE support TUG:14.32 sec  12/01/23: 5STS: 19.78 sec no UE support TUG:12.06 sec SLS: severe trendelenburg but able to hold approx 2-3  sec Tandem stance: needs help to get into position but able to hold 2-3 sec   12/15/23 5STS: 21.96 sec no UE support TUG:16.40 sec with cane  JOINT MOBILITY TESTING:  See PROM measurement above Open end feel                                                                                                                              TREATMENT DATE:  [facility] Adult PT Treatment:                                             Date: 12/31/23 Pt seen for aquatic therapy today.  Treatment took place in water  3.5-4.75 ft in depth at the [facility] pool. Temp of water  was 91.  Pt entered/exited the pool via stairs independently with rail.  - Intro to aquatic therapy principles - unsupported walking forward/ backward - unsupported side  stepping  - UE on wall:  toe/heel raises x 10; hip add/abd x10 ; hip flexion /extension x10;  - SLS with opp arm holding hollow noodle under water  (small circles)  - UE on rainbow hand floats: tandem gait forward/ backward (backward is challenge); box step moving in square x 5 each direction; side stepping with arm add/abdct with floats 1 lap; forward step with single arm row and return to neutral starting position  Pt requires the buoyancy and hydrostatic pressure of water  for support, and to offload joints by unweighting joint load by at least 50 % in navel deep water  and by at least 75-80% in chest to neck deep water .  Viscosity of the water  is needed for resistance of strengthening. Water  current perturbations provides challenge to standing balance requiring increased core activation.     12/20/23 UBE 3x3 L2 Supine 2lb Lt shoulder flexion with bent elbow x10 Supine TA with exhale bent knee fall out x5 each Supine 2lb chest press with serratus punch Supine 90/90 heel touches x10 slow and x10 more rapid Supine 2lb Lt UE squares at 90 deg CW and CCW x 10 each Supine bridge 10x5 Supine Lt 2lb UE tricep press at 90 deg flexion Chair sit up holding 2lb  ball with chest press each rep x12 Seated clam with red loop 2x10 slow, then 2x10 quick pulses outer range Seated hamstring curl red loop at ankles 2x10 Seated red pallof x10 each way Sit to stand x10 chair + pad with hands on thighs  12/15/23 Re-assessment visit for shoulder and low back Reviewed HEP and instructed in 2 new exercises for hip strength  12/13/23 UBE L2 3x3 fwd/bwd PT present to discuss status Mechanics for Lt shoulder reaching to curl hair - new strategy to try is more shoulder scaption/flexion with bent elbow vs abd with bent elbow Standing L to stretch Lt shoulder for flexion and lumbar spine x6 Rt lateral step up 2 x8 reps with 5 sec hold - some Rt knee pain with this but 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 Standing hip ext with bent knee 2x10 - cue for no hip drop Standing march unable to maintain even pelvis Step ups fwd 4 inch too difficult Seated clam red loop x 20 - no difficulty S/L clam red loop 2x10 B  Bridge with red loop 2x 10 SL Lt shoulder abd x10, then with 2# x 10 Attempted S/L ER - unable Supine ER/IR with 1# x 20 watch for elbow extension Supine biceps 1# x 10   12/01/23: Initial eval for lumbar spine Educated patient on severe postural issues, need to walk on a regular basis, would suggest rollator walker for safety (educated on why and how being more sedentary due to unsafe gait would  lead to further  decline), home safety education   PATIENT EDUCATION: Education details: PT eval findings, anticipated POC, progress with PT, and initial HEP Person educated: Patient Education method: Explanation, Demonstration, and Handouts Education comprehension: verbalized understanding, returned demonstration, and needs further education  HOME EXERCISE PROGRAM: Access Code: [email or web address] URL: [email or web address] Date: 12/15/2023 Prepared by: Delon Haddock  Exercises - Supine Shoulder Flexion with Dowel  - 1 x daily - 7 x weekly - 1-2 sets - 10 reps - Seated Scapular Retraction  - 1 x daily - 7 x weekly - 1 sets - 10 reps - Isometric Shoulder Flexion  - 1 x daily - 7 x weekly - 2 sets - 5 reps - 5 hold - Standing Isometric Shoulder Abduction with Doorway - Arm Bent  - 1 x daily - 7 x weekly - 2 sets - 5 reps - 5 hold - Seated Shoulder Flexion AAROM with Pulley Behind  - 1 x daily - 7 x weekly - 2 sets - 10 reps - Seated Shoulder Scaption AAROM with Pulley at Side  - 1 x daily - 7 x weekly - 2 sets - 10 reps - Standing Hip Abduction with Counter Support  - 1 x daily - 7 x weekly - 2 sets - 10 reps - Standing Hip Extension with Leg Bent and Support  - 1 x daily - 7 x weekly - 2 sets - 10 reps - Clam  - 1 x daily - 7 x weekly - 2 sets - 10 reps - Sidelying Hip Abduction  - 1 x daily - 7 x weekly - 2 sets - 10 reps ASSESSMENT:  CLINICAL IMPRESSION: Pt demonstrates safety and independence in aquatic setting with therapist instructing from deck. Pt is confident in setting, moving throughout all depths easily.  Pt is directed through various movement patterns and trials in standing positions.   Pt is provided VC and demonstration throughout session for execution of exercises and while monitoring toleration. She tolerated session well without increase in pain in back/hip/shoulder.  She demonstrates increased difficulty maintaining Rt SLS in pool.  Will plan to begin creating  aquatic HEP to issue for use at community pool.   Goals are ongoing.    OBJECTIVE IMPAIRMENTS: Abnormal gait, decreased balance, decreased ROM, decreased strength, hypomobility, increased muscle spasms, impaired flexibility, impaired UE functional use, postural dysfunction, and pain.   ACTIVITY LIMITATIONS: carrying, lifting, bending, squatting, stairs, bathing, dressing, reach over head, and hygiene/grooming  PARTICIPATION LIMITATIONS: meal prep, cleaning, laundry, interpersonal relationship, driving, shopping, and community activity  PERSONAL FACTORS: Age and 3+ comorbidities: falls risk HTN; anxiety; depression are also affecting patient's functional outcome.   REHAB POTENTIAL: Good  CLINICAL DECISION MAKING: Evolving/moderate complexity  EVALUATION COMPLEXITY: Moderate  GOALS: Goals reviewed with patient? Yes  SHORT TERM GOALS: Target date: 11/22/2023   Patient will be independent with initial HEP. Baseline:  Goal status: In Progress  2.  Patient will report > or = to 40% use of Lt UE with light home and self care tasks due to improved ROM and strength. Baseline:  Goal status: In Progress  3.  Patient will demonstrate correct seated and standing posture to reduce mechanical strain. Baseline:  Goal status: MET 12/15/23  4.  .  Patient will demonstrate > or = to 100 degrees in Lt  shoulder P/ROM for improved reaching. Baseline:  Goal status: MET 150 deg 11/17/23  5.  Patient to report ability to ambulate for at least 5 min without need to sit.  Baseline:  Goal status: MET 12/15/23  6. Patient to obtain proper assistive device to reduce fall risk  Baseline: not using anything  Goals status: MET 12/15/23    LONG TERM GOALS: Target date: 02/09/2024  Patient will demonstrate independence in advanced HEP. Baseline:  Goal status: INITIAL  2.  .  Patient will report > or = to 70% of normal use of Rt UE for ADLs due to improved functional strength and A/ROM. Baseline:   Goal status: INITIAL  3.  Patient will be able to return to return water  aerobics program with no difficulty to establish normal exercise routine Baseline:  Goal status: INITIAL   4.  Patient will be able to wash dishes and fold clothes with < or = to 2/10 left shoulder pain to for improved functional use of extremity. Baseline:  Goal status: INITIAL  5.  Patient will demonstrate > or = to 115 degrees in Rt shoulder A/ROM for improved reaching overhead. Baseline:  Goal status: INITIAL  6.  Patient's Annitta will be 64/100 for improved functional use of Lt UE and decreased disability.  Baseline: 52.3/100 Goal status: INITIAL  7. ODI to improve to 16/50  Baseline: 20/50  Goal status: Initial  8. Patient to be able to stand or walk for at least 15 min without rest  Baseline:   Goal status: Initial  PLAN: PT FREQUENCY: 1-2x/week  PT DURATION: 8 weeks  PLANNED INTERVENTIONS: 97164- PT Re-evaluation, 97110-Therapeutic exercises, 97530- Therapeutic activity, 97112- Neuromuscular re-education, 97535- Self Care, [id number]- Manual therapy, [phone]- Gait training, [phone]- Canalith repositioning, [id number]- Aquatic Therapy, [phone]- Electrical stimulation (unattended), [phone]- Electrical stimulation (manual), [id number]- Vasopneumatic device, [id number]- Ultrasound, [id number]- Ionotophoresis 4mg /ml Dexamethasone , 20560 (1-2 muscles), 20561 (3+ muscles)- Dry Needling, Patient/Family education, Balance training, Stair training, Taping, Joint mobilization, Joint manipulation, Spinal manipulation, Spinal mobilization, Scar mobilization, Vestibular training, Cryotherapy, and Moist heat  PLAN FOR NEXT SESSION: aquatic PT 2-3x to learn trunk/hip/core strength routine, land based: Lt shoulder ROM/strength, trunk/hip/core strength in supine, SL and seated  Delon Aquas, PTA 12/31/23 2:18 PM [facility] [address] [place], [place], [address] Phone:  [phone]   Fax:  [phone]

## 2024-01-03 ENCOUNTER — Encounter: Payer: Self-pay | Admitting: Radiology

## 2024-01-03 DIAGNOSIS — I10 Essential (primary) hypertension: Secondary | ICD-10-CM | POA: Diagnosis not present

## 2024-01-03 DIAGNOSIS — I4891 Unspecified atrial fibrillation: Secondary | ICD-10-CM | POA: Diagnosis not present

## 2024-01-03 DIAGNOSIS — K219 Gastro-esophageal reflux disease without esophagitis: Secondary | ICD-10-CM | POA: Diagnosis not present

## 2024-01-03 DIAGNOSIS — J988 Other specified respiratory disorders: Secondary | ICD-10-CM | POA: Diagnosis not present

## 2024-01-03 DIAGNOSIS — J019 Acute sinusitis, unspecified: Secondary | ICD-10-CM | POA: Diagnosis not present

## 2024-01-05 ENCOUNTER — Encounter: Payer: Self-pay | Admitting: Physical Therapy

## 2024-01-05 ENCOUNTER — Ambulatory Visit: Attending: Orthopaedic Surgery | Admitting: Physical Therapy

## 2024-01-05 DIAGNOSIS — G8929 Other chronic pain: Secondary | ICD-10-CM | POA: Insufficient documentation

## 2024-01-05 DIAGNOSIS — M6281 Muscle weakness (generalized): Secondary | ICD-10-CM | POA: Diagnosis not present

## 2024-01-05 DIAGNOSIS — R252 Cramp and spasm: Secondary | ICD-10-CM | POA: Insufficient documentation

## 2024-01-05 DIAGNOSIS — R262 Difficulty in walking, not elsewhere classified: Secondary | ICD-10-CM | POA: Insufficient documentation

## 2024-01-05 DIAGNOSIS — M5459 Other low back pain: Secondary | ICD-10-CM | POA: Insufficient documentation

## 2024-01-05 DIAGNOSIS — M47816 Spondylosis without myelopathy or radiculopathy, lumbar region: Secondary | ICD-10-CM | POA: Insufficient documentation

## 2024-01-05 DIAGNOSIS — M25612 Stiffness of left shoulder, not elsewhere classified: Secondary | ICD-10-CM | POA: Diagnosis not present

## 2024-01-05 DIAGNOSIS — M25512 Pain in left shoulder: Secondary | ICD-10-CM | POA: Diagnosis not present

## 2024-01-05 DIAGNOSIS — R293 Abnormal posture: Secondary | ICD-10-CM | POA: Diagnosis not present

## 2024-01-05 NOTE — Therapy (Signed)
 OUTPATIENT PHYSICAL THERAPY UPPER EXTREMITY and LUMBAR TREATMENT NOTE    Patient Name: Michelle Johnston MRN: [id number] DOB:February 13, 1951, 73 y.o., female Today's Date: 01/05/2024  END OF SESSION:  PT End of Session - 01/05/24 0803     Visit Number 16    Number of Visits 16    Date for Recertification  02/09/24    Authorization Type [employer] approved 12 visits 12/21/23-03/20/24    Authorization - Visit Number 2    Authorization - Number of Visits 12    Progress Note Due on Visit 20    PT Start Time 0803    PT Stop Time 0842    PT Time Calculation (min) 39 min    Activity Tolerance Patient tolerated treatment well    Behavior During Therapy [facility] for tasks assessed/performed           Past Medical History:  Diagnosis Date   Anxiety    Arthritis    Atrial fibrillation (HCC)    Bradycardia    Bronchitis    Depression    Dyspnea    GERD (gastroesophageal reflux disease)    Glaucoma    Hypertension    Presence of Watchman left atrial appendage closure device 08/07/2021   Watchman FLX 27mm with Dr. Cindie   Seasonal allergies    Sleep apnea    does not use CPAP   Wears glasses    Past Surgical History:  Procedure Laterality Date   ABDOMINAL HYSTERECTOMY  1990   ATRIAL FIBRILLATION ABLATION N/A 08/12/2020   Procedure: ATRIAL FIBRILLATION ABLATION;  Surgeon: Cindie Ole DASEN, MD;  Location: [facility];  Service: Cardiovascular;  Laterality: N/A;   ATRIAL FIBRILLATION ABLATION N/A 03/26/2023   Procedure: ATRIAL FIBRILLATION ABLATION;  Surgeon: Cindie Ole DASEN, MD;  Location: [facility];  Service: Cardiovascular;  Laterality: N/A;   BREAST BIOPSY Right    benign   CARDIOVERSION N/A 08/15/2019   Procedure: CARDIOVERSION;  Surgeon: Elmira Newman PARAS, MD;  Location: [facility];  Service: Cardiovascular;  Laterality: N/A;   COLONOSCOPY     LEFT ATRIAL APPENDAGE OCCLUSION N/A 08/07/2021   Procedure: LEFT ATRIAL APPENDAGE OCCLUSION;  Surgeon: Cindie Ole DASEN,  MD;  Location: [facility];  Service: Cardiovascular;  Laterality: N/A;   meniscus tear knee     left knee   NASAL SINUS SURGERY  1975   ORIF ANKLE FRACTURE  2009   left   ORIF HUMERUS FRACTURE Right 07/15/2023   Procedure: OPEN REDUCTION INTERNAL FIXATION (ORIF) DISTAL HUMERUS FRACTURE;  Surgeon: Cristy Bonner DASEN, MD;  Location: [facility];  Service: Orthopedics;  Laterality: Right;   REVERSE SHOULDER ARTHROPLASTY Left 09/02/2023   Procedure: ARTHROPLASTY, SHOULDER, TOTAL, REVERSE;  Surgeon: Cristy Bonner DASEN, MD;  Location: [facility];  Service: Orthopedics;  Laterality: Left;   TEE WITHOUT CARDIOVERSION N/A 08/07/2021   Procedure: TRANSESOPHAGEAL ECHOCARDIOGRAM (TEE);  Surgeon: Cindie Ole DASEN, MD;  Location: [facility];  Service: Cardiovascular;  Laterality: N/A;   TOTAL KNEE ARTHROPLASTY Left 02/03/2019   Procedure: LEFT TOTAL KNEE ARTHROPLASTY;  Surgeon: Vernetta Lonni GRADE, MD;  Location: [facility];  Service: Orthopedics;  Laterality: Left;   TURBINATE REDUCTION Bilateral 06/04/2014   Procedure: BILATERAL TURBINATE REDUCTION;  Surgeon: Daniel Moccasin, MD;  Location: [facility];  Service: ENT;  Laterality: Bilateral;   TYMPANOSTOMY TUBE PLACEMENT     left   Patient Active Problem List   Diagnosis Date Noted   Near syncope 09/11/2023  Impacted cerumen of left ear 08/15/2023   Other specified disorders of eustachian tube, left ear 02/06/2023   Central perforation of tympanic membrane of left ear 02/06/2023   Presence of Watchman left atrial appendage closure device 08/07/2021   Atrial fibrillation (HCC) 08/07/2021   OSA (obstructive sleep apnea) 10/29/2020   Persistent atrial fibrillation (HCC) 03/27/2020   Hypercoagulable state due to persistent atrial fibrillation (HCC) 03/27/2020   PAD (peripheral artery disease) 12/27/2019   Exertional chest pain 12/27/2019   Primary osteoarthritis of right knee 07/12/2019   Paroxysmal atrial fibrillation (HCC)  06/16/2019   Nonrheumatic aortic valve insufficiency 06/16/2019   Nonrheumatic mitral valve regurgitation 06/16/2019   Tachycardia 02/20/2019   Abnormal EKG 02/20/2019   Essential hypertension 02/20/2019   Status post total left knee replacement 02/03/2019   Unilateral primary osteoarthritis, left knee 11/30/2016   Chronic pain of left knee 11/30/2016    PCP:   Loreli Kins, MD    REFERRING PROVIDER: Cristy Bonner DASEN, MD/ Margeret Kotyk, MD  REFERRING DIAG: left total shoulder arthoplasty ORIF tuberosities/ lumbar stenosis  THERAPY DIAG:  Other low back pain  Chronic left shoulder pain  Lumbar spondylosis  Muscle weakness (generalized)  Abnormal posture  Stiffness of left shoulder, not elsewhere classified  Cramp and spasm  Difficulty in walking, not elsewhere classified  Rationale for Evaluation and Treatment: Rehabilitation  ONSET DATE: Surgery 09/02/2023  SUBJECTIVE:                                                                                                                                                                                      SUBJECTIVE STATEMENT:Feeling good this AM  POOL ACCESS: GAC - goes to water  aerobics 1-2x/wk  Hand dominance: Right  PERTINENT HISTORY: Anxiety; OA; Depression; HTN; Hx left TKA  PAIN:   Are you having pain? no  PRECAUTIONS: Shoulder and Fall see protocol in media  RED FLAGS:  None   WEIGHT BEARING RESTRICTIONS: No  FALLS:  Has patient fallen in last 6 months? Yes. Number of falls 2 fall. Patient had two surgeries two weeks from each other due to falling. She fell while she was at the grocery store. She was turning and her husband said her feet did not move with her. PT to address balance   LIVING ENVIRONMENT: Lives with: lives with their spouse Lives in: House/apartment Stairs: Yes: Internal: 12 steps; on left going up Has following equipment at home: Single point cane and Walker - 2  wheeled  OCCUPATION: Retired  PLOF: Independent, Independent with basic ADLs, Independent with household mobility without device, Independent with community mobility without device, Independent with gait, and Independent with transfers  PATIENT GOALS: use of my arm again. To get back to water  aerobics and drive  NEXT MD VISIT:   OBJECTIVE:  Note: Objective measures were completed at Evaluation unless otherwise noted.   PATIENT SURVEYS :  Initial eval QuickDASH : 52.3/100 52.3%  12/15/23: QuickDASH : 34.1/100 34.2%   12/01/23 ODI: 20/50= 40%  12/15/23 ODI: 21/50= 42%  COGNITION: Overall cognitive status: Within functional limits for tasks assessed     SENSATION: WFL  POSTURE: Rounded shoulder & forward head Standing posture/ gait: right pelvic shift and left trunk lean  UPPER EXTREMITY ROM:   Passive ROM Right eval Left eval Left 11/17/23 Left 1015/25  Shoulder flexion  90 150 160  Shoulder extension  NT due to restrictions    Shoulder abduction  75 85 120  Shoulder adduction      Shoulder internal rotation  NT due to restrictions  70  Shoulder external rotation  NT due to restrictions 22 60  Elbow flexion      Elbow extension      Wrist flexion      Wrist extension      Wrist ulnar deviation      Wrist radial deviation      Wrist pronation      Wrist supination      (Blank rows = not tested)  11/17/23 seated scaption  5 deg, flex 68 deg  UPPER EXTREMITY MMT: NT due to weight restrictions of 1lb  LUMBAR ROM: All WFL  LOWER EXTREMITY MMT: significant proximal hip weakness, quad 4-/5,  DF 4-/5    LOWER EXTREMITY ROM:  FUNCTIONAL TESTING Initial eval: 5STS: 17.47 sec no UE support TUG:14.32 sec  12/01/23: 5STS: 19.78 sec no UE support TUG:12.06 sec SLS: severe trendelenburg but able to hold approx 2-3 sec Tandem stance: needs help to get into position but able to hold 2-3 sec   12/15/23 5STS: 21.96 sec no UE support TUG:16.40 sec with  cane  JOINT MOBILITY TESTING:  See PROM measurement above Open end feel                                                                                                                              TREATMENT DATE:  [facility] Adult PT Treatment:                                               01/05/24:Pt seen for aquatic therapy today.  Treatment took place in water  3.5-4.75 ft in depth at the [facility] pool. Temp of water  was 91.  Pt entered/exited the pool via stairs independently with rail. - walking forward/ backward with UE punches rainbow floats used 6 lengths - side stepping 6 lengths with rainbow float horiz abd/add - UE on wall:  toe/heel raises x 15; hip add/abd x15 ; hip flexion /extension x15;  -  SLS with opp arm holding hollow noodle under water  (small circles) RTLE 3x 20 sec, VC to stand tall in her leg -Side steps on first stair 2x 6 holding rails. - Pt requires the buoyancy and hydrostatic pressure of water  for support, and to offload joints by unweighting joint load by at least 50 % in navel deep water  and by at least 75-80% in chest to neck deep water .  Viscosity of the water  is needed for resistance of strengthening. Water  current perturbations provides challenge to standing balance requiring increased core activation.     Date: 12/31/23 Pt seen for aquatic therapy today.  Treatment took place in water  3.5-4.75 ft in depth at the [facility] pool. Temp of water  was 91.  Pt entered/exited the pool via stairs independently with rail.  - Intro to aquatic therapy principles - unsupported walking forward/ backward - unsupported side stepping  - UE on wall:  toe/heel raises x 10; hip add/abd x10 ; hip flexion /extension x10;  - SLS with opp arm holding hollow noodle under water  (small circles)  - UE on rainbow hand floats: tandem gait forward/ backward (backward is challenge); box step moving in square x 5 each direction; side stepping with arm add/abdct with  floats 1 lap; forward step with single arm row and return to neutral starting position  Pt requires the buoyancy and hydrostatic pressure of water  for support, and to offload joints by unweighting joint load by at least 50 % in navel deep water  and by at least 75-80% in chest to neck deep water .  Viscosity of the water  is needed for resistance of strengthening. Water  current perturbations provides challenge to standing balance requiring increased core activation.     12/20/23 UBE 3x3 L2 Supine 2lb Lt shoulder flexion with bent elbow x10 Supine TA with exhale bent knee fall out x5 each Supine 2lb chest press with serratus punch Supine 90/90 heel touches x10 slow and x10 more rapid Supine 2lb Lt UE squares at 90 deg CW and CCW x 10 each Supine bridge 10x5 Supine Lt 2lb UE tricep press at 90 deg flexion Chair sit up holding 2lb ball with chest press each rep x12 Seated clam with red loop 2x10 slow, then 2x10 quick pulses outer range Seated hamstring curl red loop at ankles 2x10 Seated red pallof x10 each way Sit to stand x10 chair + pad with hands on thighs  PATIENT EDUCATION: Education details: PT eval findings, anticipated POC, progress with PT, and initial HEP Person educated: Patient Education method: Explanation, Demonstration, and Handouts Education comprehension: verbalized understanding, returned demonstration, and needs further education  HOME EXERCISE PROGRAM: Access Code: [email or web address] URL: [email or web address] Date: 12/15/2023 Prepared by: Delon Haddock  Exercises - Supine Shoulder Flexion with Dowel  - 1 x daily - 7 x weekly - 1-2 sets - 10 reps - Seated Scapular Retraction  - 1 x daily - 7 x weekly - 1 sets - 10 reps - Isometric Shoulder Flexion  - 1 x daily - 7 x weekly - 2 sets - 5 reps - 5 hold - Standing Isometric Shoulder Abduction with Doorway - Arm Bent  - 1 x daily - 7 x weekly - 2 sets - 5 reps - 5 hold - Seated Shoulder Flexion AAROM with Pulley  Behind  - 1 x daily - 7 x weekly - 2 sets - 10 reps - Seated Shoulder Scaption AAROM with Pulley at Side  - 1 x daily - 7 x weekly - 2 sets -  10 reps - Standing Hip Abduction with Counter Support  - 1 x daily - 7 x weekly - 2 sets - 10 reps - Standing Hip Extension with Leg Bent and Support  - 1 x daily - 7 x weekly - 2 sets - 10 reps - Clam  - 1 x daily - 7 x weekly - 2 sets - 10 reps - Sidelying Hip Abduction  - 1 x daily - 7 x weekly - 2 sets - 10 reps ASSESSMENT:  CLINICAL IMPRESSION:Pt tolerating her aquatic exercises very well as well as enjoying them; compliance likely high to continue on her own. She recognizes the improvement in her ability to generate a contraction in her RT glute with less and less pain.    OBJECTIVE IMPAIRMENTS: Abnormal gait, decreased balance, decreased ROM, decreased strength, hypomobility, increased muscle spasms, impaired flexibility, impaired UE functional use, postural dysfunction, and pain.   ACTIVITY LIMITATIONS: carrying, lifting, bending, squatting, stairs, bathing, dressing, reach over head, and hygiene/grooming  PARTICIPATION LIMITATIONS: meal prep, cleaning, laundry, interpersonal relationship, driving, shopping, and community activity  PERSONAL FACTORS: Age and 3+ comorbidities: falls risk HTN; anxiety; depression are also affecting patient's functional outcome.   REHAB POTENTIAL: Good  CLINICAL DECISION MAKING: Evolving/moderate complexity  EVALUATION COMPLEXITY: Moderate  GOALS: Goals reviewed with patient? Yes  SHORT TERM GOALS: Target date: 11/22/2023   Patient will be independent with initial HEP. Baseline:  Goal status: In Progress  2.  Patient will report > or = to 40% use of Lt UE with light home and self care tasks due to improved ROM and strength. Baseline:  Goal status: In Progress  3.  Patient will demonstrate correct seated and standing posture to reduce mechanical strain. Baseline:  Goal status: MET 12/15/23  4.  .   Patient will demonstrate > or = to 100 degrees in Lt  shoulder P/ROM for improved reaching. Baseline:  Goal status: MET 150 deg 11/17/23  5.  Patient to report ability to ambulate for at least 5 min without need to sit.  Baseline:  Goal status: MET 12/15/23  6. Patient to obtain proper assistive device to reduce fall risk  Baseline: not using anything  Goals status: MET 12/15/23    LONG TERM GOALS: Target date: 02/09/2024  Patient will demonstrate independence in advanced HEP. Baseline:  Goal status: INITIAL  2.  .  Patient will report > or = to 70% of normal use of Rt UE for ADLs due to improved functional strength and A/ROM. Baseline:  Goal status: INITIAL  3.  Patient will be able to return to return water  aerobics program with no difficulty to establish normal exercise routine Baseline:  Goal status: INITIAL   4.  Patient will be able to wash dishes and fold clothes with < or = to 2/10 left shoulder pain to for improved functional use of extremity. Baseline:  Goal status: INITIAL  5.  Patient will demonstrate > or = to 115 degrees in Rt shoulder A/ROM for improved reaching overhead. Baseline:  Goal status: INITIAL  6.  Patient's Annitta will be 64/100 for improved functional use of Lt UE and decreased disability.  Baseline: 52.3/100 Goal status: INITIAL  7. ODI to improve to 16/50  Baseline: 20/50  Goal status: Initial  8. Patient to be able to stand or walk for at least 15 min without rest  Baseline:   Goal status: Initial  PLAN: PT FREQUENCY: 1-2x/week  PT DURATION: 8 weeks  PLANNED INTERVENTIONS: 02835- PT Re-evaluation, 97110-Therapeutic  exercises, 97530- Therapeutic activity, [id number]- Neuromuscular re-education, [phone]- Self Care, 02859- Manual therapy, [phone]- Gait training, [phone]- Canalith repositioning, [phone]- Aquatic Therapy, [phone]- Electrical stimulation (unattended), [phone]- Electrical stimulation (manual), [id number]- Vasopneumatic device, [id number]-  Ultrasound, [id number]- Ionotophoresis 4mg /ml Dexamethasone , 79439 (1-2 muscles), [id number] (3+ muscles)- Dry Needling, Patient/Family education, Balance training, Stair training, Taping, Joint mobilization, Joint manipulation, Spinal manipulation, Spinal mobilization, Scar mobilization, Vestibular training, Cryotherapy, and Moist heat  PLAN FOR NEXT SESSION: aquatic PT 2-3x to learn trunk/hip/core strength routine, land based: Lt shoulder ROM/strength, trunk/hip/core strength in supine, SL and seated  Delon Darner, PTA 01/05/24 8:43 AM   [facility] [address] [place], [place], [address] Phone: [phone]   Fax:  [phone]

## 2024-01-11 ENCOUNTER — Encounter (HOSPITAL_BASED_OUTPATIENT_CLINIC_OR_DEPARTMENT_OTHER): Payer: Self-pay | Admitting: Physical Therapy

## 2024-01-11 ENCOUNTER — Ambulatory Visit (HOSPITAL_BASED_OUTPATIENT_CLINIC_OR_DEPARTMENT_OTHER): Attending: Orthopaedic Surgery | Admitting: Physical Therapy

## 2024-01-11 DIAGNOSIS — R293 Abnormal posture: Secondary | ICD-10-CM | POA: Insufficient documentation

## 2024-01-11 DIAGNOSIS — G8929 Other chronic pain: Secondary | ICD-10-CM | POA: Diagnosis not present

## 2024-01-11 DIAGNOSIS — M6281 Muscle weakness (generalized): Secondary | ICD-10-CM | POA: Insufficient documentation

## 2024-01-11 DIAGNOSIS — M5459 Other low back pain: Secondary | ICD-10-CM | POA: Insufficient documentation

## 2024-01-11 DIAGNOSIS — M47816 Spondylosis without myelopathy or radiculopathy, lumbar region: Secondary | ICD-10-CM | POA: Insufficient documentation

## 2024-01-11 DIAGNOSIS — M25512 Pain in left shoulder: Secondary | ICD-10-CM | POA: Insufficient documentation

## 2024-01-11 NOTE — Therapy (Signed)
 OUTPATIENT PHYSICAL THERAPY UPPER EXTREMITY and LUMBAR TREATMENT NOTE    Patient Name: Michelle Johnston MRN: [id number] DOB:1950/08/05, 73 y.o., female Today's Date: 01/11/2024  END OF SESSION:  PT End of Session - 01/11/24 0858     Visit Number 17    Date for Recertification  02/09/24    Authorization Type [employer] approved 12 visits 12/21/23-03/20/24    Authorization - Visit Number 3    Authorization - Number of Visits 12    Progress Note Due on Visit 20    PT Start Time 0847    PT Stop Time 0925    PT Time Calculation (min) 38 min    Activity Tolerance Patient tolerated treatment well           Past Medical History:  Diagnosis Date   Anxiety    Arthritis    Atrial fibrillation (HCC)    Bradycardia    Bronchitis    Depression    Dyspnea    GERD (gastroesophageal reflux disease)    Glaucoma    Hypertension    Presence of Watchman left atrial appendage closure device 08/07/2021   Watchman FLX 27mm with Dr. Cindie   Seasonal allergies    Sleep apnea    does not use CPAP   Wears glasses    Past Surgical History:  Procedure Laterality Date   ABDOMINAL HYSTERECTOMY  1990   ATRIAL FIBRILLATION ABLATION N/A 08/12/2020   Procedure: ATRIAL FIBRILLATION ABLATION;  Surgeon: Cindie Ole DASEN, MD;  Location: [facility];  Service: Cardiovascular;  Laterality: N/A;   ATRIAL FIBRILLATION ABLATION N/A 03/26/2023   Procedure: ATRIAL FIBRILLATION ABLATION;  Surgeon: Cindie Ole DASEN, MD;  Location: [facility];  Service: Cardiovascular;  Laterality: N/A;   BREAST BIOPSY Right    benign   CARDIOVERSION N/A 08/15/2019   Procedure: CARDIOVERSION;  Surgeon: Elmira Newman PARAS, MD;  Location: [facility];  Service: Cardiovascular;  Laterality: N/A;   COLONOSCOPY     LEFT ATRIAL APPENDAGE OCCLUSION N/A 08/07/2021   Procedure: LEFT ATRIAL APPENDAGE OCCLUSION;  Surgeon: Cindie Ole DASEN, MD;  Location: [facility];  Service: Cardiovascular;  Laterality: N/A;    meniscus tear knee     left knee   NASAL SINUS SURGERY  1975   ORIF ANKLE FRACTURE  2009   left   ORIF HUMERUS FRACTURE Right 07/15/2023   Procedure: OPEN REDUCTION INTERNAL FIXATION (ORIF) DISTAL HUMERUS FRACTURE;  Surgeon: Cristy Bonner DASEN, MD;  Location: [facility];  Service: Orthopedics;  Laterality: Right;   REVERSE SHOULDER ARTHROPLASTY Left 09/02/2023   Procedure: ARTHROPLASTY, SHOULDER, TOTAL, REVERSE;  Surgeon: Cristy Bonner DASEN, MD;  Location: [facility];  Service: Orthopedics;  Laterality: Left;   TEE WITHOUT CARDIOVERSION N/A 08/07/2021   Procedure: TRANSESOPHAGEAL ECHOCARDIOGRAM (TEE);  Surgeon: Cindie Ole DASEN, MD;  Location: [facility];  Service: Cardiovascular;  Laterality: N/A;   TOTAL KNEE ARTHROPLASTY Left 02/03/2019   Procedure: LEFT TOTAL KNEE ARTHROPLASTY;  Surgeon: Vernetta Lonni GRADE, MD;  Location: [facility];  Service: Orthopedics;  Laterality: Left;   TURBINATE REDUCTION Bilateral 06/04/2014   Procedure: BILATERAL TURBINATE REDUCTION;  Surgeon: Daniel Moccasin, MD;  Location: [facility];  Service: ENT;  Laterality: Bilateral;   TYMPANOSTOMY TUBE PLACEMENT     left   Patient Active Problem List   Diagnosis Date Noted   Near syncope 09/11/2023   Impacted cerumen of left ear 08/15/2023   Other specified disorders of eustachian tube, left ear  02/06/2023   Central perforation of tympanic membrane of left ear 02/06/2023   Presence of Watchman left atrial appendage closure device 08/07/2021   Atrial fibrillation (HCC) 08/07/2021   OSA (obstructive sleep apnea) 10/29/2020   Persistent atrial fibrillation (HCC) 03/27/2020   Hypercoagulable state due to persistent atrial fibrillation (HCC) 03/27/2020   PAD (peripheral artery disease) 12/27/2019   Exertional chest pain 12/27/2019   Primary osteoarthritis of right knee 07/12/2019   Paroxysmal atrial fibrillation (HCC) 06/16/2019   Nonrheumatic aortic valve insufficiency 06/16/2019   Nonrheumatic mitral  valve regurgitation 06/16/2019   Tachycardia 02/20/2019   Abnormal EKG 02/20/2019   Essential hypertension 02/20/2019   Status post total left knee replacement 02/03/2019   Unilateral primary osteoarthritis, left knee 11/30/2016   Chronic pain of left knee 11/30/2016    PCP:   Loreli Kins, MD    REFERRING PROVIDER: Cristy Bonner DASEN, MD/ Ibazebo, Alcan Border, MD  REFERRING DIAG: left total shoulder arthoplasty ORIF tuberosities/ lumbar stenosis  THERAPY DIAG:  Other low back pain  Chronic left shoulder pain  Lumbar spondylosis  Muscle weakness (generalized)  Abnormal posture  Rationale for Evaluation and Treatment: Rehabilitation  ONSET DATE: Surgery 09/02/2023  SUBJECTIVE:                                                                                                                                                                                      SUBJECTIVE STATEMENT: Pt reports that she had a fall this weekend at home. She fell onto L arm and bil knees, but nothing is broken.   Pt reports she went to [facility] 1x since last visit and did some walking and some exercises from last aquatic therapy session.   POOL ACCESS: GAC - goes to water  aerobics 1-2x/wk  Hand dominance: Right  PERTINENT HISTORY: Anxiety; OA; Depression; HTN; Hx left TKA  PAIN:   Are you having pain? no  PRECAUTIONS: Shoulder and Fall see protocol in media  RED FLAGS:  None   WEIGHT BEARING RESTRICTIONS: No  FALLS:  Has patient fallen in last 6 months? Yes. Number of falls 2 fall. Patient had two surgeries two weeks from each other due to falling. She fell while she was at the grocery store. She was turning and her husband said her feet did not move with her. PT to address balance   LIVING ENVIRONMENT: Lives with: lives with their spouse Lives in: House/apartment Stairs: Yes: Internal: 12 steps; on left going up Has following equipment at home: Single point cane and Walker - 2  wheeled  OCCUPATION: Retired  PLOF: Independent, Independent with basic ADLs, Independent with household mobility without device, Independent with community mobility  without device, Independent with gait, and Independent with transfers  PATIENT GOALS: use of my arm again. To get back to water  aerobics and drive  NEXT MD VISIT:   OBJECTIVE:  Note: Objective measures were completed at Evaluation unless otherwise noted.   PATIENT SURVEYS :  Initial eval QuickDASH : 52.3/100 52.3%  12/15/23: QuickDASH : 34.1/100 34.2%   12/01/23 ODI: 20/50= 40%  12/15/23 ODI: 21/50= 42%  COGNITION: Overall cognitive status: Within functional limits for tasks assessed     SENSATION: WFL  POSTURE: Rounded shoulder & forward head Standing posture/ gait: right pelvic shift and left trunk lean  UPPER EXTREMITY ROM:   Passive ROM Right eval Left eval Left 11/17/23 Left 1015/25  Shoulder flexion  90 150 160  Shoulder extension  NT due to restrictions    Shoulder abduction  75 85 120  Shoulder adduction      Shoulder internal rotation  NT due to restrictions  70  Shoulder external rotation  NT due to restrictions 22 60  Elbow flexion      Elbow extension      Wrist flexion      Wrist extension      Wrist ulnar deviation      Wrist radial deviation      Wrist pronation      Wrist supination      (Blank rows = not tested)  11/17/23 seated scaption  5 deg, flex 68 deg  UPPER EXTREMITY MMT: NT due to weight restrictions of 1lb  LUMBAR ROM: All WFL  LOWER EXTREMITY MMT: significant proximal hip weakness, quad 4-/5,  DF 4-/5    LOWER EXTREMITY ROM:  FUNCTIONAL TESTING Initial eval: 5STS: 17.47 sec no UE support TUG:14.32 sec  12/01/23: 5STS: 19.78 sec no UE support TUG:12.06 sec SLS: severe trendelenburg but able to hold approx 2-3 sec Tandem stance: needs help to get into position but able to hold 2-3 sec   12/15/23 5STS: 21.96 sec no UE support TUG:16.40 sec with  cane  JOINT MOBILITY TESTING:  See PROM measurement above Open end feel                                                                                                                              TREATMENT DATE:  [facility] Adult PT Treatment:                                              Date: 01/11/24 Pt seen for aquatic therapy today.  Treatment took place in water  3.5-4.75 ft in depth at the [facility] pool. Temp of water  was 91.  Pt entered/exited the pool via stairs independently with rail.  - unsupported walking forward/ backward, with reciprocal arm swing, multiple laps - unsupported side stepping -> with arm horz abdct/add with rainbow hand floats -> with arm  add/abdct with rainbow hand floats - marching with reciprocal row - backwards / forwards  - suitcase carry with bil rainbow / single yellow hand floats under water  at side, walking forward and backward - UE on yellow hand floats:  toe/heel raises x 20; hip add/abd x10 ; hip flexion /extension x10;  - noodle stomp with hollow noodle - 10 slow/ 10 quick each LE, no ue support - SLS with opp arm holding hollow noodle under water  (small circles)  - tandem gait forward/backward with UE On noodle -> then hands out of water  - no UE support: forward step up/retro step down x 10  - issued laminated HEP   01/05/24:Pt seen for aquatic therapy today.  Treatment took place in water  3.5-4.75 ft in depth at the [facility] pool. Temp of water  was 91.  Pt entered/exited the pool via stairs independently with rail. - walking forward/ backward with UE punches rainbow floats used 6 lengths - side stepping 6 lengths with rainbow float horiz abd/add - UE on wall:  toe/heel raises x 15; hip add/abd x15 ; hip flexion /extension x15;  - SLS with opp arm holding hollow noodle under water  (small circles) RTLE 3x 20 sec, VC to stand tall in her leg -Side steps on first stair 2x 6 holding rails. - Pt requires the buoyancy and  hydrostatic pressure of water  for support, and to offload joints by unweighting joint load by at least 50 % in navel deep water  and by at least 75-80% in chest to neck deep water .  Viscosity of the water  is needed for resistance of strengthening. Water  current perturbations provides challenge to standing balance requiring increased core activation.     Date: 12/31/23 Pt seen for aquatic therapy today.  Treatment took place in water  3.5-4.75 ft in depth at the [facility] pool. Temp of water  was 91.  Pt entered/exited the pool via stairs independently with rail.  - Intro to aquatic therapy principles - unsupported walking forward/ backward - unsupported side stepping  - UE on wall:  toe/heel raises x 10; hip add/abd x10 ; hip flexion /extension x10;  - SLS with opp arm holding hollow noodle under water  (small circles)  - UE on rainbow hand floats: tandem gait forward/ backward (backward is challenge); box step moving in square x 5 each direction; side stepping with arm add/abdct with floats 1 lap; forward step with single arm row and return to neutral starting position  Pt requires the buoyancy and hydrostatic pressure of water  for support, and to offload joints by unweighting joint load by at least 50 % in navel deep water  and by at least 75-80% in chest to neck deep water .  Viscosity of the water  is needed for resistance of strengthening. Water  current perturbations provides challenge to standing balance requiring increased core activation.     12/20/23 UBE 3x3 L2 Supine 2lb Lt shoulder flexion with bent elbow x10 Supine TA with exhale bent knee fall out x5 each Supine 2lb chest press with serratus punch Supine 90/90 heel touches x10 slow and x10 more rapid Supine 2lb Lt UE squares at 90 deg CW and CCW x 10 each Supine bridge 10x5 Supine Lt 2lb UE tricep press at 90 deg flexion Chair sit up holding 2lb ball with chest press each rep x12 Seated clam with red loop 2x10 slow,  then 2x10 quick pulses outer range Seated hamstring curl red loop at ankles 2x10 Seated red pallof x10 each way Sit to stand x10 chair +  pad with hands on thighs  PATIENT EDUCATION: Education details: PT eval findings, anticipated POC, progress with PT, and initial HEP Person educated: Patient Education method: Explanation, Demonstration, and Handouts Education comprehension: verbalized understanding, returned demonstration, and needs further education  HOME EXERCISE PROGRAM: Access Code: [email or web address] URL: [email or web address] Date: 12/15/2023 Prepared by: Delon Haddock  Exercises - Supine Shoulder Flexion with Dowel  - 1 x daily - 7 x weekly - 1-2 sets - 10 reps - Seated Scapular Retraction  - 1 x daily - 7 x weekly - 1 sets - 10 reps - Isometric Shoulder Flexion  - 1 x daily - 7 x weekly - 2 sets - 5 reps - 5 hold - Standing Isometric Shoulder Abduction with Doorway - Arm Bent  - 1 x daily - 7 x weekly - 2 sets - 5 reps - 5 hold - Seated Shoulder Flexion AAROM with Pulley Behind  - 1 x daily - 7 x weekly - 2 sets - 10 reps - Seated Shoulder Scaption AAROM with Pulley at Side  - 1 x daily - 7 x weekly - 2 sets - 10 reps - Standing Hip Abduction with Counter Support  - 1 x daily - 7 x weekly - 2 sets - 10 reps - Standing Hip Extension with Leg Bent and Support  - 1 x daily - 7 x weekly - 2 sets - 10 reps - Clam  - 1 x daily - 7 x weekly - 2 sets - 10 reps - Sidelying Hip Abduction  - 1 x daily - 7 x weekly - 2 sets - 10 reps  AQUATIC Access Code: [email or web address] URL: [email or web address] Prepared by: [facility] [address] This aquatic home exercise program from MedBridge utilizes pictures from land based exercises, but has been adapted prior to lamination and issuance.   ASSESSMENT:  CLINICAL IMPRESSION: Pt tolerated all aquatic exercises well, with report of less pain. Pt was issued laminated aquatic HEP and given instruction on the  exercises.  Balance much improved compared to last aquatic visit.   Pt is approaching their max potential in aquatic setting and may benefit from fully transition onto land intervention to progress strengthening and towards remaining goals.   OBJECTIVE IMPAIRMENTS: Abnormal gait, decreased balance, decreased ROM, decreased strength, hypomobility, increased muscle spasms, impaired flexibility, impaired UE functional use, postural dysfunction, and pain.   ACTIVITY LIMITATIONS: carrying, lifting, bending, squatting, stairs, bathing, dressing, reach over head, and hygiene/grooming  PARTICIPATION LIMITATIONS: meal prep, cleaning, laundry, interpersonal relationship, driving, shopping, and community activity  PERSONAL FACTORS: Age and 3+ comorbidities: falls risk HTN; anxiety; depression are also affecting patient's functional outcome.   REHAB POTENTIAL: Good  CLINICAL DECISION MAKING: Evolving/moderate complexity  EVALUATION COMPLEXITY: Moderate  GOALS: Goals reviewed with patient? Yes  SHORT TERM GOALS: Target date: 11/22/2023   Patient will be independent with initial HEP. Baseline:  Goal status: In Progress  2.  Patient will report > or = to 40% use of Lt UE with light home and self care tasks due to improved ROM and strength. Baseline:  Goal status: In Progress  3.  Patient will demonstrate correct seated and standing posture to reduce mechanical strain. Baseline:  Goal status: MET 12/15/23  4.  .  Patient will demonstrate > or = to 100 degrees in Lt  shoulder P/ROM for improved reaching. Baseline:  Goal status: MET 150 deg 11/17/23  5.  Patient to report ability to ambulate for at least 5 min without need to  sit.  Baseline:  Goal status: MET 12/15/23  6. Patient to obtain proper assistive device to reduce fall risk  Baseline: not using anything  Goals status: MET 12/15/23    LONG TERM GOALS: Target date: 02/09/2024  Patient will demonstrate independence in advanced  HEP. Baseline:  Goal status: INITIAL  2.  .  Patient will report > or = to 70% of normal use of Rt UE for ADLs due to improved functional strength and A/ROM. Baseline:  Goal status: INITIAL  3.  Patient will be able to return to return water  aerobics program with no difficulty to establish normal exercise routine Baseline:  Goal status: INITIAL   4.  Patient will be able to wash dishes and fold clothes with < or = to 2/10 left shoulder pain to for improved functional use of extremity. Baseline: washing dishes much improved, folding clothes is painful Goal status:In progress - 01/11/24  5.  Patient will demonstrate > or = to 115 degrees in Rt shoulder A/ROM for improved reaching overhead. Baseline:  Goal status: INITIAL  6.  Patient's Annitta will be 64/100 for improved functional use of Lt UE and decreased disability.  Baseline: 52.3/100 Goal status: INITIAL  7. ODI to improve to 16/50  Baseline: 20/50  Goal status: Initial  8. Patient to be able to stand or walk for at least 15 min without rest  Baseline:   Goal status: Initial  PLAN: PT FREQUENCY: 1-2x/week  PT DURATION: 8 weeks  PLANNED INTERVENTIONS: 97164- PT Re-evaluation, 97110-Therapeutic exercises, 97530- Therapeutic activity, 97112- Neuromuscular re-education, 97535- Self Care, 02859- Manual therapy, [phone]- Gait training, [phone]- Canalith repositioning, [id number]- Aquatic Therapy, [phone]- Electrical stimulation (unattended), [phone]- Electrical stimulation (manual), [id number]- Vasopneumatic device, [id number]- Ultrasound, [id number]- Ionotophoresis 4mg /ml Dexamethasone , 79439 (1-2 muscles), [id number] (3+ muscles)- Dry Needling, Patient/Family education, Balance training, Stair training, Taping, Joint mobilization, Joint manipulation, Spinal manipulation, Spinal mobilization, Scar mobilization, Vestibular training, Cryotherapy, and Moist heat  PLAN FOR NEXT SESSION: aquatic PT 2-3x to learn trunk/hip/core strength routine, land based: Lt  shoulder ROM/strength, trunk/hip/core strength in supine, SL and seated  Delon Aquas, PTA 01/11/24 9:40 AM [facility] [address] [place], [place], [address] Phone: [phone]   Fax:  [phone]

## 2024-01-13 DIAGNOSIS — M7918 Myalgia, other site: Secondary | ICD-10-CM | POA: Diagnosis not present

## 2024-01-13 DIAGNOSIS — M7071 Other bursitis of hip, right hip: Secondary | ICD-10-CM | POA: Diagnosis not present

## 2024-01-17 ENCOUNTER — Ambulatory Visit: Admitting: Physical Therapy

## 2024-01-19 ENCOUNTER — Ambulatory Visit

## 2024-01-19 DIAGNOSIS — E7800 Pure hypercholesterolemia, unspecified: Secondary | ICD-10-CM | POA: Diagnosis not present

## 2024-01-19 DIAGNOSIS — Z131 Encounter for screening for diabetes mellitus: Secondary | ICD-10-CM | POA: Diagnosis not present

## 2024-01-19 DIAGNOSIS — I10 Essential (primary) hypertension: Secondary | ICD-10-CM | POA: Diagnosis not present

## 2024-01-19 LAB — LAB REPORT - SCANNED
A1c: 5.5
Albumin, Urine POC: 4.94
Creatinine, POC: 91 mg/dL
EGFR: 99
Microalb Creat Ratio: 54.1

## 2024-01-24 ENCOUNTER — Ambulatory Visit: Admitting: Physical Therapy

## 2024-01-31 ENCOUNTER — Ambulatory Visit: Attending: Orthopaedic Surgery

## 2024-01-31 DIAGNOSIS — M5459 Other low back pain: Secondary | ICD-10-CM | POA: Diagnosis present

## 2024-01-31 DIAGNOSIS — R293 Abnormal posture: Secondary | ICD-10-CM | POA: Diagnosis present

## 2024-01-31 DIAGNOSIS — M25612 Stiffness of left shoulder, not elsewhere classified: Secondary | ICD-10-CM | POA: Diagnosis present

## 2024-01-31 DIAGNOSIS — M25512 Pain in left shoulder: Secondary | ICD-10-CM | POA: Insufficient documentation

## 2024-01-31 DIAGNOSIS — R2681 Unsteadiness on feet: Secondary | ICD-10-CM | POA: Diagnosis present

## 2024-01-31 DIAGNOSIS — M6281 Muscle weakness (generalized): Secondary | ICD-10-CM | POA: Insufficient documentation

## 2024-01-31 DIAGNOSIS — R262 Difficulty in walking, not elsewhere classified: Secondary | ICD-10-CM | POA: Insufficient documentation

## 2024-01-31 DIAGNOSIS — M47816 Spondylosis without myelopathy or radiculopathy, lumbar region: Secondary | ICD-10-CM | POA: Insufficient documentation

## 2024-01-31 DIAGNOSIS — G8929 Other chronic pain: Secondary | ICD-10-CM | POA: Insufficient documentation

## 2024-01-31 DIAGNOSIS — M25521 Pain in right elbow: Secondary | ICD-10-CM | POA: Diagnosis present

## 2024-01-31 DIAGNOSIS — M25611 Stiffness of right shoulder, not elsewhere classified: Secondary | ICD-10-CM | POA: Diagnosis present

## 2024-01-31 DIAGNOSIS — R252 Cramp and spasm: Secondary | ICD-10-CM | POA: Diagnosis present

## 2024-01-31 NOTE — Therapy (Signed)
 OUTPATIENT PHYSICAL THERAPY UPPER EXTREMITY and LUMBAR TREATMENT NOTE    Patient Name: Michelle Johnston MRN: [id number] DOB:[birth year], 73 y.o., female Today's Date: 01/31/2024  END OF SESSION:  PT End of Session - 01/31/24 1022     Visit Number 18    Number of Visits 26    Date for Recertification  03/27/24    Authorization Type [employer] approved 12 visits 12/21/23-03/20/24    Authorization - Visit Number 4    Authorization - Number of Visits 12    Progress Note Due on Visit 20    PT Start Time 1015    PT Stop Time 1100    PT Time Calculation (min) 45 min    Activity Tolerance Patient tolerated treatment well    Behavior During Therapy [facility] for tasks assessed/performed           Past Medical History:  Diagnosis Date   Anxiety    Arthritis    Atrial fibrillation (HCC)    Bradycardia    Bronchitis    Depression    Dyspnea    GERD (gastroesophageal reflux disease)    Glaucoma    Hypertension    Presence of Watchman left atrial appendage closure device 08/07/2021   Watchman FLX 27mm with Dr. Cindie   Seasonal allergies    Sleep apnea    does not use CPAP   Wears glasses    Past Surgical History:  Procedure Laterality Date   ABDOMINAL HYSTERECTOMY  1990   ATRIAL FIBRILLATION ABLATION N/A 08/12/2020   Procedure: ATRIAL FIBRILLATION ABLATION;  Surgeon: Cindie Ole DASEN, MD;  Location: [facility];  Service: Cardiovascular;  Laterality: N/A;   ATRIAL FIBRILLATION ABLATION N/A 03/26/2023   Procedure: ATRIAL FIBRILLATION ABLATION;  Surgeon: Cindie Ole DASEN, MD;  Location: [facility];  Service: Cardiovascular;  Laterality: N/A;   BREAST BIOPSY Right    benign   CARDIOVERSION N/A 08/15/2019   Procedure: CARDIOVERSION;  Surgeon: Elmira Newman PARAS, MD;  Location: [facility];  Service: Cardiovascular;  Laterality: N/A;   COLONOSCOPY     LEFT ATRIAL APPENDAGE OCCLUSION N/A 08/07/2021   Procedure: LEFT ATRIAL APPENDAGE OCCLUSION;  Surgeon: Cindie Ole DASEN,  MD;  Location: [facility];  Service: Cardiovascular;  Laterality: N/A;   meniscus tear knee     left knee   NASAL SINUS SURGERY  1975   ORIF ANKLE FRACTURE  2009   left   ORIF HUMERUS FRACTURE Right 07/15/2023   Procedure: OPEN REDUCTION INTERNAL FIXATION (ORIF) DISTAL HUMERUS FRACTURE;  Surgeon: Cristy Bonner DASEN, MD;  Location: [facility];  Service: Orthopedics;  Laterality: Right;   REVERSE SHOULDER ARTHROPLASTY Left 09/02/2023   Procedure: ARTHROPLASTY, SHOULDER, TOTAL, REVERSE;  Surgeon: Cristy Bonner DASEN, MD;  Location: [facility];  Service: Orthopedics;  Laterality: Left;   TEE WITHOUT CARDIOVERSION N/A 08/07/2021   Procedure: TRANSESOPHAGEAL ECHOCARDIOGRAM (TEE);  Surgeon: Cindie Ole DASEN, MD;  Location: [facility] INVASIVE CV LAB;  Service: Cardiovascular;  Laterality: N/A;   TOTAL KNEE ARTHROPLASTY Left 02/03/2019   Procedure: LEFT TOTAL KNEE ARTHROPLASTY;  Surgeon: Vernetta Lonni GRADE, MD;  Location: [facility];  Service: Orthopedics;  Laterality: Left;   TURBINATE REDUCTION Bilateral 06/04/2014   Procedure: BILATERAL TURBINATE REDUCTION;  Surgeon: Daniel Moccasin, MD;  Location: [facility];  Service: ENT;  Laterality: Bilateral;   TYMPANOSTOMY TUBE PLACEMENT     left   Patient Active Problem List   Diagnosis Date Noted   Near syncope 09/11/2023  Impacted cerumen of left ear 08/15/2023   Other specified disorders of eustachian tube, left ear 02/06/2023   Central perforation of tympanic membrane of left ear 02/06/2023   Presence of Watchman left atrial appendage closure device 08/07/2021   Atrial fibrillation (HCC) 08/07/2021   OSA (obstructive sleep apnea) 10/29/2020   Persistent atrial fibrillation (HCC) 03/27/2020   Hypercoagulable state due to persistent atrial fibrillation (HCC) 03/27/2020   PAD (peripheral artery disease) 12/27/2019   Exertional chest pain 12/27/2019   Primary osteoarthritis of right knee 07/12/2019   Paroxysmal atrial fibrillation (HCC)  06/16/2019   Nonrheumatic aortic valve insufficiency 06/16/2019   Nonrheumatic mitral valve regurgitation 06/16/2019   Tachycardia 02/20/2019   Abnormal EKG 02/20/2019   Essential hypertension 02/20/2019   Status post total left knee replacement 02/03/2019   Unilateral primary osteoarthritis, left knee 11/30/2016   Chronic pain of left knee 11/30/2016    PCP:   Loreli Kins, MD    REFERRING PROVIDER: Cristy Bonner DASEN, MD/ Margeret Kotyk, MD  REFERRING DIAG: left total shoulder arthoplasty ORIF tuberosities/ lumbar stenosis  THERAPY DIAG:  Muscle weakness (generalized) - Plan: PT plan of care cert/re-cert  Chronic left shoulder pain - Plan: PT plan of care cert/re-cert  Difficulty in walking, not elsewhere classified - Plan: PT plan of care cert/re-cert  Unsteadiness on feet - Plan: PT plan of care cert/re-cert  Other low back pain - Plan: PT plan of care cert/re-cert  Cramp and spasm - Plan: PT plan of care cert/re-cert  Stiffness of right shoulder, not elsewhere classified - Plan: PT plan of care cert/re-cert  Pain in right elbow - Plan: PT plan of care cert/re-cert  Stiffness of left shoulder, not elsewhere classified - Plan: PT plan of care cert/re-cert  Abnormal posture - Plan: PT plan of care cert/re-cert  Lumbar spondylosis - Plan: PT plan of care cert/re-cert  Rationale for Evaluation and Treatment: Rehabilitation  ONSET DATE: Surgery 09/02/2023  SUBJECTIVE:                                                                                                                                                                                      SUBJECTIVE STATEMENT: Pt reports that she fell at home a few weeks ago. She fell onto L arm and bil knees, but nothing is broken.   Pt reports she has not been back to the [facility] or done any walking since she was in therapy last.   She has been sick with a virus that was respiratory in nature and it has lasted about 3 weeks.     POOL ACCESS: GAC - goes to water  aerobics 1-2x/wk  Hand dominance: Right  PERTINENT HISTORY: Anxiety; OA; Depression; HTN; Hx left TKA  PAIN:   Are you having pain? no  PRECAUTIONS: Shoulder and Fall see protocol in media  RED FLAGS:  None   WEIGHT BEARING RESTRICTIONS: No  FALLS:  Has patient fallen in last 6 months? Yes. Number of falls 2 fall. Patient had two surgeries two weeks from each other due to falling. She fell while she was at the grocery store. She was turning and her husband said her feet did not move with her. PT to address balance   LIVING ENVIRONMENT: Lives with: lives with their spouse Lives in: House/apartment Stairs: Yes: Internal: 12 steps; on left going up Has following equipment at home: Single point cane and Walker - 2 wheeled  OCCUPATION: Retired  PLOF: Independent, Independent with basic ADLs, Independent with household mobility without device, Independent with community mobility without device, Independent with gait, and Independent with transfers  PATIENT GOALS: use of my arm again. To get back to water  aerobics and drive  NEXT MD VISIT:   OBJECTIVE:  Note: Objective measures were completed at Evaluation unless otherwise noted.   PATIENT SURVEYS :  Initial eval QuickDASH : 52.3/100 52.3%  12/15/23: QuickDASH : 34.1/100 34.2%  01/31/24: QuickDASH : 43.2/100 43.2%   12/01/23 ODI: 20/50= 40%  12/15/23 ODI: 21/50= 42%  01/31/24: ODI: 21/50= 42%  COGNITION: Overall cognitive status: Within functional limits for tasks assessed     SENSATION: WFL  POSTURE: Rounded shoulder & forward head Standing posture/ gait: right pelvic shift and left trunk lean  UPPER EXTREMITY ROM:   Passive ROM Right eval Left eval Left 11/17/23 Left 1015/25 Left 01/31/24  Shoulder flexion  90 150 160 160  Shoulder extension  NT due to restrictions     Shoulder abduction  75 85 120 125  Shoulder adduction       Shoulder internal rotation   NT due to restrictions  70 WNL  Shoulder external rotation  NT due to restrictions 22 60 C7  Elbow flexion       Elbow extension       Wrist flexion       Wrist extension       Wrist ulnar deviation       Wrist radial deviation       Wrist pronation       Wrist supination       (Blank rows = not tested)  11/17/23 seated scaption  5 deg, flex 68 deg  UPPER EXTREMITY MMT: NT due to weight restrictions of 1lb  LUMBAR ROM: All WFL  LOWER EXTREMITY MMT: significant proximal hip weakness, quad 4-/5,  DF 4-/5      01/31/24: right hip flexor 4/5     LOWER EXTREMITY ROM:  FUNCTIONAL TESTING Initial eval: 5STS: 17.47 sec no UE support TUG:14.32 sec  12/01/23: 5STS: 19.78 sec no UE support TUG:12.06 sec SLS: severe trendelenburg but able to hold approx 2-3 sec Tandem stance: needs help to get into position but able to hold 2-3 sec   12/15/23 5STS: 21.96 sec no UE support TUG:16.40 sec with cane  01/31/24 5STS: 21.30 sec with UE support TUG:14.41 sec with cane  JOINT MOBILITY TESTING:  See PROM measurement above Open end feel  TREATMENT DATE:  [facility] Adult PT Treatment:                                              01/31/24 Re-assessment completed Reviewed HEP Instructed in appropriate activities for staying generally fit including a walking program: gave a specific walking schedule for in the home, fall prevention Went over upcoming appointments scheduled  Date: 01/11/24 Pt seen for aquatic therapy today.  Treatment took place in water  3.5-4.75 ft in depth at the [facility] pool. Temp of water  was 91.  Pt entered/exited the pool via stairs independently with rail.  - unsupported walking forward/ backward, with reciprocal arm swing, multiple laps - unsupported side stepping -> with arm horz abdct/add with rainbow hand floats -> with arm  add/abdct with rainbow hand floats - marching with reciprocal row - backwards / forwards  - suitcase carry with bil rainbow / single yellow hand floats under water  at side, walking forward and backward - UE on yellow hand floats:  toe/heel raises x 20; hip add/abd x10 ; hip flexion /extension x10;  - noodle stomp with hollow noodle - 10 slow/ 10 quick each LE, no ue support - SLS with opp arm holding hollow noodle under water  (small circles)  - tandem gait forward/backward with UE On noodle -> then hands out of water  - no UE support: forward step up/retro step down x 10  - issued laminated HEP   01/05/24:Pt seen for aquatic therapy today.  Treatment took place in water  3.5-4.75 ft in depth at the [facility] pool. Temp of water  was 91.  Pt entered/exited the pool via stairs independently with rail. - walking forward/ backward with UE punches rainbow floats used 6 lengths - side stepping 6 lengths with rainbow float horiz abd/add - UE on wall:  toe/heel raises x 15; hip add/abd x15 ; hip flexion /extension x15;  - SLS with opp arm holding hollow noodle under water  (small circles) RTLE 3x 20 sec, VC to stand tall in her leg -Side steps on first stair 2x 6 holding rails. - Pt requires the buoyancy and hydrostatic pressure of water  for support, and to offload joints by unweighting joint load by at least 50 % in navel deep water  and by at least 75-80% in chest to neck deep water .  Viscosity of the water  is needed for resistance of strengthening. Water  current perturbations provides challenge to standing balance requiring increased core activation.    PATIENT EDUCATION: Education details: PT eval findings, anticipated POC, progress with PT, and initial HEP Person educated: Patient Education method: Explanation, Demonstration, and Handouts Education comprehension: verbalized understanding, returned demonstration, and needs further education  HOME EXERCISE PROGRAM: Access Code:  [email or web address] URL: [email or web address] Date: 12/15/2023 Prepared by: Delon Haddock  Exercises - Supine Shoulder Flexion with Dowel  - 1 x daily - 7 x weekly - 1-2 sets - 10 reps - Seated Scapular Retraction  - 1 x daily - 7 x weekly - 1 sets - 10 reps - Isometric Shoulder Flexion  - 1 x daily - 7 x weekly - 2 sets - 5 reps - 5 hold - Standing Isometric Shoulder Abduction with Doorway - Arm Bent  - 1 x daily - 7 x weekly - 2 sets - 5 reps - 5 hold - Seated Shoulder Flexion AAROM with Pulley Behind  - 1 x daily - 7 x  weekly - 2 sets - 10 reps - Seated Shoulder Scaption AAROM with Pulley at Side  - 1 x daily - 7 x weekly - 2 sets - 10 reps - Standing Hip Abduction with Counter Support  - 1 x daily - 7 x weekly - 2 sets - 10 reps - Standing Hip Extension with Leg Bent and Support  - 1 x daily - 7 x weekly - 2 sets - 10 reps - Clam  - 1 x daily - 7 x weekly - 2 sets - 10 reps - Sidelying Hip Abduction  - 1 x daily - 7 x weekly - 2 sets - 10 reps  AQUATIC Access Code: [email or web address] URL: [email or web address] Prepared by: [facility] [address] This aquatic home exercise program from Med[employer] utilizes pictures from land based exercises, but has been adapted prior to lamination and issuance.   ASSESSMENT:  CLINICAL IMPRESSION: Samanatha has been out sick for several weeks.  She seems to have experienced some increased weakness but shoulder ROM is improved.  She is now using a cane consistently and verbalizes increased awareness of fall prevention.  She ambulates with improved symmetry when using cane.  She would benefit from continuing skilled PT to progress toward goals stated below.     OBJECTIVE IMPAIRMENTS: Abnormal gait, decreased balance, decreased ROM, decreased strength, hypomobility, increased muscle spasms, impaired flexibility, impaired UE functional use, postural dysfunction, and pain.   ACTIVITY LIMITATIONS: carrying, lifting, bending,  squatting, stairs, bathing, dressing, reach over head, and hygiene/grooming  PARTICIPATION LIMITATIONS: meal prep, cleaning, laundry, interpersonal relationship, driving, shopping, and community activity  PERSONAL FACTORS: Age and 3+ comorbidities: falls risk HTN; anxiety; depression are also affecting patient's functional outcome.   REHAB POTENTIAL: Good  CLINICAL DECISION MAKING: Evolving/moderate complexity  EVALUATION COMPLEXITY: Moderate  GOALS: Goals reviewed with patient? Yes  SHORT TERM GOALS: Target date: 11/22/2023   Patient will be independent with initial HEP. Baseline:  Goal status: In Progress  2.  Patient will report > or = to 40% use of Lt UE with light home and self care tasks due to improved ROM and strength. Baseline:  Goal status: In Progress  3.  Patient will demonstrate correct seated and standing posture to reduce mechanical strain. Baseline:  Goal status: MET 12/15/23  4.  .  Patient will demonstrate > or = to 100 degrees in Lt  shoulder P/ROM for improved reaching. Baseline:  Goal status: MET 150 deg 11/17/23  5.  Patient to report ability to ambulate for at least 5 min without need to sit.  Baseline:  Goal status: MET 12/15/23  6. Patient to obtain proper assistive device to reduce fall risk  Baseline: not using anything  Goals status: MET 12/15/23    LONG TERM GOALS: Target date: 02/09/2024  Patient will demonstrate independence in advanced HEP. Baseline:  Goal status: INITIAL  2.  .  Patient will report > or = to 70% of normal use of Rt UE for ADLs due to improved functional strength and A/ROM. Baseline:  Goal status: INITIAL  3.  Patient will be able to return to return water  aerobics program with no difficulty to establish normal exercise routine Baseline:  Goal status: INITIAL   4.  Patient will be able to wash dishes and fold clothes with < or = to 2/10 left shoulder pain to for improved functional use of extremity. Baseline:  washing dishes much improved, folding clothes is painful Goal status:In progress - 01/11/24  5.  Patient will demonstrate > or = to 115 degrees in Rt shoulder A/ROM for improved reaching overhead. Baseline:  Goal status: INITIAL  6.  Patient's Annitta will be 64/100 for improved functional use of Lt UE and decreased disability.  Baseline: 52.3/100 Goal status: INITIAL  7. ODI to improve to 16/50  Baseline: 20/50  Goal status: Initial  8. Patient to be able to stand or walk for at least 15 min without rest  Baseline:   Goal status: Initial  PLAN: PT FREQUENCY: 1-2x/week  PT DURATION: 8 weeks  PLANNED INTERVENTIONS: [id number]- PT Re-evaluation, 97110-Therapeutic exercises, 97530- Therapeutic activity, 97112- Neuromuscular re-education, 97535- Self Care, [id number]- Manual therapy, [id number]- Gait training, [phone]- Canalith repositioning, [id number]- Aquatic Therapy, [phone]- Electrical stimulation (unattended), [phone]- Electrical stimulation (manual), [id number]- Vasopneumatic device, [id number]- Ultrasound, [id number]- Ionotophoresis 4mg /ml Dexamethasone , 79439 (1-2 muscles), [id number] (3+ muscles)- Dry Needling, Patient/Family education, Balance training, Stair training, Taping, Joint mobilization, Joint manipulation, Spinal manipulation, Spinal mobilization, Scar mobilization, Vestibular training, Cryotherapy, and Moist heat  PLAN FOR NEXT SESSION: Extend/recert to progress toward final goals.  Recert completed.  Continue to work on strength, balance, fall prevention, left shoulder ROM and strength and pain control.   Delon B. Nili Honda, PT 01/31/24 5:17 PM [facility] [address] [place], [place] [address] Phone # [phone] Fax [phone]

## 2024-02-01 DIAGNOSIS — E871 Hypo-osmolality and hyponatremia: Secondary | ICD-10-CM | POA: Diagnosis not present

## 2024-02-03 ENCOUNTER — Ambulatory Visit: Admitting: Physical Therapy

## 2024-02-04 ENCOUNTER — Ambulatory Visit (INDEPENDENT_AMBULATORY_CARE_PROVIDER_SITE_OTHER): Admitting: Audiology

## 2024-02-04 ENCOUNTER — Ambulatory Visit (HOSPITAL_BASED_OUTPATIENT_CLINIC_OR_DEPARTMENT_OTHER): Attending: Orthopaedic Surgery | Admitting: Physical Therapy

## 2024-02-04 ENCOUNTER — Ambulatory Visit (INDEPENDENT_AMBULATORY_CARE_PROVIDER_SITE_OTHER): Admitting: Otolaryngology

## 2024-02-07 ENCOUNTER — Ambulatory Visit: Payer: Self-pay | Admitting: Cardiology

## 2024-02-07 ENCOUNTER — Ambulatory Visit: Admitting: Physical Therapy

## 2024-02-09 ENCOUNTER — Ambulatory Visit

## 2024-02-09 ENCOUNTER — Encounter: Payer: Self-pay | Admitting: Physical Therapy

## 2024-02-09 ENCOUNTER — Ambulatory Visit: Admitting: Physical Therapy

## 2024-02-09 DIAGNOSIS — R252 Cramp and spasm: Secondary | ICD-10-CM

## 2024-02-09 DIAGNOSIS — G8929 Other chronic pain: Secondary | ICD-10-CM

## 2024-02-09 DIAGNOSIS — R2681 Unsteadiness on feet: Secondary | ICD-10-CM

## 2024-02-09 DIAGNOSIS — E871 Hypo-osmolality and hyponatremia: Secondary | ICD-10-CM | POA: Diagnosis not present

## 2024-02-09 DIAGNOSIS — M6281 Muscle weakness (generalized): Secondary | ICD-10-CM | POA: Diagnosis not present

## 2024-02-09 DIAGNOSIS — M25611 Stiffness of right shoulder, not elsewhere classified: Secondary | ICD-10-CM

## 2024-02-09 DIAGNOSIS — M5459 Other low back pain: Secondary | ICD-10-CM

## 2024-02-09 DIAGNOSIS — R293 Abnormal posture: Secondary | ICD-10-CM

## 2024-02-09 DIAGNOSIS — M25521 Pain in right elbow: Secondary | ICD-10-CM

## 2024-02-09 DIAGNOSIS — M25612 Stiffness of left shoulder, not elsewhere classified: Secondary | ICD-10-CM

## 2024-02-09 DIAGNOSIS — R262 Difficulty in walking, not elsewhere classified: Secondary | ICD-10-CM

## 2024-02-09 NOTE — Therapy (Signed)
 OUTPATIENT PHYSICAL THERAPY UPPER EXTREMITY and LUMBAR TREATMENT NOTE    Patient Name: Michelle Johnston MRN: [id number] DOB:Feb 10, 1951, 73 y.o., female Today's Date: 02/09/2024  END OF SESSION:  PT End of Session - 02/09/24 0846     Visit Number 19    Number of Visits 26    Date for Recertification  03/27/24    Authorization Type [employer] approved 12 visits 12/21/23-03/20/24    Authorization - Visit Number 5    Authorization - Number of Visits 12    Progress Note Due on Visit 20    PT Start Time 0840    PT Stop Time 0930    PT Time Calculation (min) 50 min    Activity Tolerance Patient tolerated treatment well    Behavior During Therapy [facility] for tasks assessed/performed           Past Medical History:  Diagnosis Date   Anxiety    Arthritis    Atrial fibrillation (HCC)    Bradycardia    Bronchitis    Depression    Dyspnea    GERD (gastroesophageal reflux disease)    Glaucoma    Hypertension    Presence of Watchman left atrial appendage closure device 08/07/2021   Watchman FLX 27mm with Dr. Cindie   Seasonal allergies    Sleep apnea    does not use CPAP   Wears glasses    Past Surgical History:  Procedure Laterality Date   ABDOMINAL HYSTERECTOMY  1990   ATRIAL FIBRILLATION ABLATION N/A 08/12/2020   Procedure: ATRIAL FIBRILLATION ABLATION;  Surgeon: Cindie Ole DASEN, MD;  Location: [facility];  Service: Cardiovascular;  Laterality: N/A;   ATRIAL FIBRILLATION ABLATION N/A 03/26/2023   Procedure: ATRIAL FIBRILLATION ABLATION;  Surgeon: Cindie Ole DASEN, MD;  Location: [facility];  Service: Cardiovascular;  Laterality: N/A;   BREAST BIOPSY Right    benign   CARDIOVERSION N/A 08/15/2019   Procedure: CARDIOVERSION;  Surgeon: Elmira Newman PARAS, MD;  Location: [facility];  Service: Cardiovascular;  Laterality: N/A;   COLONOSCOPY     LEFT ATRIAL APPENDAGE OCCLUSION N/A 08/07/2021   Procedure: LEFT ATRIAL APPENDAGE OCCLUSION;  Surgeon: Cindie Ole DASEN,  MD;  Location: [facility];  Service: Cardiovascular;  Laterality: N/A;   meniscus tear knee     left knee   NASAL SINUS SURGERY  1975   ORIF ANKLE FRACTURE  2009   left   ORIF HUMERUS FRACTURE Right 07/15/2023   Procedure: OPEN REDUCTION INTERNAL FIXATION (ORIF) DISTAL HUMERUS FRACTURE;  Surgeon: Cristy Bonner DASEN, MD;  Location: [facility];  Service: Orthopedics;  Laterality: Right;   REVERSE SHOULDER ARTHROPLASTY Left 09/02/2023   Procedure: ARTHROPLASTY, SHOULDER, TOTAL, REVERSE;  Surgeon: Cristy Bonner DASEN, MD;  Location: [facility] OR;  Service: Orthopedics;  Laterality: Left;   TEE WITHOUT CARDIOVERSION N/A 08/07/2021   Procedure: TRANSESOPHAGEAL ECHOCARDIOGRAM (TEE);  Surgeon: Cindie Ole DASEN, MD;  Location: [facility] INVASIVE CV LAB;  Service: Cardiovascular;  Laterality: N/A;   TOTAL KNEE ARTHROPLASTY Left 02/03/2019   Procedure: LEFT TOTAL KNEE ARTHROPLASTY;  Surgeon: Vernetta Lonni GRADE, MD;  Location: [facility]S;  Service: Orthopedics;  Laterality: Left;   TURBINATE REDUCTION Bilateral 06/04/2014   Procedure: BILATERAL TURBINATE REDUCTION;  Surgeon: Daniel Moccasin, MD;  Location: [facility];  Service: ENT;  Laterality: Bilateral;   TYMPANOSTOMY TUBE PLACEMENT     left   Patient Active Problem List   Diagnosis Date Noted   Near syncope 09/11/2023  Impacted cerumen of left ear 08/15/2023   Other specified disorders of eustachian tube, left ear 02/06/2023   Central perforation of tympanic membrane of left ear 02/06/2023   Presence of Watchman left atrial appendage closure device 08/07/2021   Atrial fibrillation (HCC) 08/07/2021   OSA (obstructive sleep apnea) 10/29/2020   Persistent atrial fibrillation (HCC) 03/27/2020   Hypercoagulable state due to persistent atrial fibrillation (HCC) 03/27/2020   PAD (peripheral artery disease) 12/27/2019   Exertional chest pain 12/27/2019   Primary osteoarthritis of right knee 07/12/2019   Paroxysmal atrial fibrillation (HCC)  06/16/2019   Nonrheumatic aortic valve insufficiency 06/16/2019   Nonrheumatic mitral valve regurgitation 06/16/2019   Tachycardia 02/20/2019   Abnormal EKG 02/20/2019   Essential hypertension 02/20/2019   Status post total left knee replacement 02/03/2019   Unilateral primary osteoarthritis, left knee 11/30/2016   Chronic pain of left knee 11/30/2016    PCP:   Loreli Kins, MD    REFERRING PROVIDER: Cristy Bonner DASEN, MD/ Margeret Kotyk, MD  REFERRING DIAG: left total shoulder arthoplasty ORIF tuberosities/ lumbar stenosis  THERAPY DIAG:  Muscle weakness (generalized)  Difficulty in walking, not elsewhere classified  Chronic left shoulder pain  Unsteadiness on feet  Other low back pain  Cramp and spasm  Stiffness of right shoulder, not elsewhere classified  Pain in right elbow  Stiffness of left shoulder, not elsewhere classified  Abnormal posture  Rationale for Evaluation and Treatment: Rehabilitation  ONSET DATE: Surgery 09/02/2023  SUBJECTIVE:                                                                                                                                                                                      SUBJECTIVE STATEMENT: Doing pretty good today. RT elbow deep ache with tingling in my fingers. Otherwise no other complaints  POOL ACCESS: GAC - goes to water  aerobics 1-2x/wk  Hand dominance: Right  PERTINENT HISTORY: Anxiety; OA; Depression; HTN; Hx left TKA  PAIN:   Are you having pain? no  PRECAUTIONS: Shoulder and Fall see protocol in media  RED FLAGS:  None   WEIGHT BEARING RESTRICTIONS: No  FALLS:  Has patient fallen in last 6 months? Yes. Number of falls 2 fall. Patient had two surgeries two weeks from each other due to falling. She fell while she was at the grocery store. She was turning and her husband said her feet did not move with her. PT to address balance   LIVING ENVIRONMENT: Lives with: lives with their  spouse Lives in: House/apartment Stairs: Yes: Internal: 12 steps; on left going up Has following equipment at home: Single point cane and Walker - 2 wheeled  OCCUPATION:  Retired  PLOF: Independent, Independent with basic ADLs, Independent with household mobility without device, Independent with community mobility without device, Independent with gait, and Independent with transfers  PATIENT GOALS: use of my arm again. To get back to water  aerobics and drive  NEXT MD VISIT:   OBJECTIVE:  Note: Objective measures were completed at Evaluation unless otherwise noted.   PATIENT SURVEYS :  Initial eval QuickDASH : 52.3/100 52.3%  12/15/23: QuickDASH : 34.1/100 34.2%  01/31/24: QuickDASH : 43.2/100 43.2%   12/01/23 ODI: 20/50= 40%  12/15/23 ODI: 21/50= 42%  01/31/24: ODI: 21/50= 42%  COGNITION: Overall cognitive status: Within functional limits for tasks assessed     SENSATION: WFL  POSTURE: Rounded shoulder & forward head Standing posture/ gait: right pelvic shift and left trunk lean  UPPER EXTREMITY ROM:   Passive ROM Right eval Left eval Left 11/17/23 Left 1015/25 Left 01/31/24  Shoulder flexion  90 150 160 160  Shoulder extension  NT due to restrictions     Shoulder abduction  75 85 120 125  Shoulder adduction       Shoulder internal rotation  NT due to restrictions  70 WNL  Shoulder external rotation  NT due to restrictions 22 60 C7  Elbow flexion       Elbow extension       Wrist flexion       Wrist extension       Wrist ulnar deviation       Wrist radial deviation       Wrist pronation       Wrist supination       (Blank rows = not tested)  11/17/23 seated scaption  5 deg, flex 68 deg  UPPER EXTREMITY MMT: NT due to weight restrictions of 1lb  LUMBAR ROM: All WFL  LOWER EXTREMITY MMT: significant proximal hip weakness, quad 4-/5,  DF 4-/5      01/31/24: right hip flexor 4/5     LOWER EXTREMITY ROM:  FUNCTIONAL TESTING Initial  eval: 5STS: 17.47 sec no UE support TUG:14.32 sec  12/01/23: 5STS: 19.78 sec no UE support TUG:12.06 sec SLS: severe trendelenburg but able to hold approx 2-3 sec Tandem stance: needs help to get into position but able to hold 2-3 sec   12/15/23 5STS: 21.96 sec no UE support TUG:16.40 sec with cane  01/31/24 5STS: 21.30 sec with UE support TUG:14.41 sec with cane  JOINT MOBILITY TESTING:  See PROM measurement above Open end feel                                                                                                                              TREATMENT DATE:  [facility] Adult PT Treatment:  02/09/24:Pt seen for aquatic therapy today.  Treatment took place in water  3.5-4.75 ft in depth at the [facility] pool. Temp of water  was 91.  Pt entered/exited the pool via stairs independently with rail. Pt requires buoyancy of water  for support and to offload joints with strengthening exercises.   - 75% depth water  walking 10x in each direction with UE push/pull using rainbow wts. -high knee marching with rainbow floats for balance asst 4 lengths - suitcase carry single yellow hand floats under water  at side, walking forward 2x4 lengths on each side - 1/4 blue hollow noodles Bil hands for triceps extesion 2x8 - single arm holding green bell, shoulder circumductions 10x each direction Bil -standing with bil green bells forward back 2x10 --core/lat press standing against wall with rainbow floats 2x10 - 3 min underwater bicycle in corner with yellow noodle in horseback   01/31/24 Re-assessment completed Reviewed HEP Instructed in appropriate activities for staying generally fit including a walking program: gave a specific walking schedule for in the home, fall prevention Went over upcoming appointments scheduled  Date: 01/11/24 Pt seen for aquatic therapy today.  Treatment took place in water  3.5-4.75 ft in depth at the  [facility] pool. Temp of water  was 91.  Pt entered/exited the pool via stairs independently with rail.  - unsupported walking forward/ backward, with reciprocal arm swing, multiple laps - unsupported side stepping -> with arm horz abdct/add with rainbow hand floats -> with arm add/abdct with rainbow hand floats - marching with reciprocal row - backwards / forwards  - suitcase carry with bil rainbow / single yellow hand floats under water  at side, walking forward and backward - UE on yellow hand floats:  toe/heel raises x 20; hip add/abd x10 ; hip flexion /extension x10;  - noodle stomp with hollow noodle - 10 slow/ 10 quick each LE, no ue support - SLS with opp arm holding hollow noodle under water  (small circles)  - tandem gait forward/backward with UE On noodle -> then hands out of water  - no UE support: forward step up/retro step down x 10  - issued laminated HEP    PATIENT EDUCATION: Education details: PT eval findings, anticipated POC, progress with PT, and initial HEP Person educated: Patient Education method: Explanation, Demonstration, and Handouts Education comprehension: verbalized understanding, returned demonstration, and needs further education  HOME EXERCISE PROGRAM: Access Code: [email or web address] URL: [email or web address] Date: 12/15/2023 Prepared by: Delon Haddock  Exercises - Supine Shoulder Flexion with Dowel  - 1 x daily - 7 x weekly - 1-2 sets - 10 reps - Seated Scapular Retraction  - 1 x daily - 7 x weekly - 1 sets - 10 reps - Isometric Shoulder Flexion  - 1 x daily - 7 x weekly - 2 sets - 5 reps - 5 hold - Standing Isometric Shoulder Abduction with Doorway - Arm Bent  - 1 x daily - 7 x weekly - 2 sets - 5 reps - 5 hold - Seated Shoulder Flexion AAROM with Pulley Behind  - 1 x daily - 7 x weekly - 2 sets - 10 reps - Seated Shoulder Scaption AAROM with Pulley at Side  - 1 x daily - 7 x weekly - 2 sets - 10 reps - Standing Hip Abduction with  Counter Support  - 1 x daily - 7 x weekly - 2 sets - 10 reps - Standing Hip Extension with Leg Bent and Support  - 1 x daily - 7 x weekly - 2 sets -  10 reps - Clam  - 1 x daily - 7 x weekly - 2 sets - 10 reps - Sidelying Hip Abduction  - 1 x daily - 7 x weekly - 2 sets - 10 reps  AQUATIC Access Code: [email or web address] URL: [email or web address] Prepared by: [facility] [address] This aquatic home exercise program from Med[employer] utilizes pictures from land based exercises, but has been adapted prior to lamination and issuance.   ASSESSMENT:  CLINICAL IMPRESSION: Pt present to aquatic PT with RT elbow pain/soreness along with some finger tingling. Pt had difficulty fully extending her elbow during certain exercises but no increase in pain. All arm/shoulder movements were painfree. Balance exercises challenging demonstrating mild unsteadiness.  OBJECTIVE IMPAIRMENTS: Abnormal gait, decreased balance, decreased ROM, decreased strength, hypomobility, increased muscle spasms, impaired flexibility, impaired UE functional use, postural dysfunction, and pain.   ACTIVITY LIMITATIONS: carrying, lifting, bending, squatting, stairs, bathing, dressing, reach over head, and hygiene/grooming  PARTICIPATION LIMITATIONS: meal prep, cleaning, laundry, interpersonal relationship, driving, shopping, and community activity  PERSONAL FACTORS: Age and 3+ comorbidities: falls risk HTN; anxiety; depression are also affecting patient's functional outcome.   REHAB POTENTIAL: Good  CLINICAL DECISION MAKING: Evolving/moderate complexity  EVALUATION COMPLEXITY: Moderate  GOALS: Goals reviewed with patient? Yes  SHORT TERM GOALS: Target date: 11/22/2023   Patient will be independent with initial HEP. Baseline:  Goal status: In Progress  2.  Patient will report > or = to 40% use of Lt UE with light home and self care tasks due to improved ROM and strength. Baseline:  Goal status: In  Progress  3.  Patient will demonstrate correct seated and standing posture to reduce mechanical strain. Baseline:  Goal status: MET 12/15/23  4.  .  Patient will demonstrate > or = to 100 degrees in Lt  shoulder P/ROM for improved reaching. Baseline:  Goal status: MET 150 deg 11/17/23  5.  Patient to report ability to ambulate for at least 5 min without need to sit.  Baseline:  Goal status: MET 12/15/23  6. Patient to obtain proper assistive device to reduce fall risk  Baseline: not using anything  Goals status: MET 12/15/23    LONG TERM GOALS: Target date: 02/09/2024  Patient will demonstrate independence in advanced HEP. Baseline:  Goal status: INITIAL  2.  .  Patient will report > or = to 70% of normal use of Rt UE for ADLs due to improved functional strength and A/ROM. Baseline:  Goal status: INITIAL  3.  Patient will be able to return to return water  aerobics program with no difficulty to establish normal exercise routine Baseline:  Goal status: INITIAL   4.  Patient will be able to wash dishes and fold clothes with < or = to 2/10 left shoulder pain to for improved functional use of extremity. Baseline: washing dishes much improved, folding clothes is painful Goal status:In progress - 01/11/24  5.  Patient will demonstrate > or = to 115 degrees in Rt shoulder A/ROM for improved reaching overhead. Baseline:  Goal status: INITIAL  6.  Patient's Annitta will be 64/100 for improved functional use of Lt UE and decreased disability.  Baseline: 52.3/100 Goal status: INITIAL  7. ODI to improve to 16/50  Baseline: 20/50  Goal status: Initial  8. Patient to be able to stand or walk for at least 15 min without rest  Baseline:   Goal status: Initial  PLAN: PT FREQUENCY: 1-2x/week  PT DURATION: 8 weeks  PLANNED INTERVENTIONS: 02835- PT  Re-evaluation, 97110-Therapeutic exercises, 97530- Therapeutic activity, [id number]- Neuromuscular re-education, [phone]- Self Care,  [phone]- Manual therapy, [phone]- Gait training, 04007- Canalith repositioning, 02886- Aquatic Therapy, [phone]- Electrical stimulation (unattended), [phone]- Electrical stimulation (manual), [id number]- Vasopneumatic device, [id number]- Ultrasound, [id number]- Ionotophoresis 4mg /ml Dexamethasone , 79439 (1-2 muscles), 20561 (3+ muscles)- Dry Needling, Patient/Family education, Balance training, Stair training, Taping, Joint mobilization, Joint manipulation, Spinal manipulation, Spinal mobilization, Scar mobilization, Vestibular training, Cryotherapy, and Moist heat  PLAN FOR NEXT SESSION:  Continue to work on strength, balance, fall prevention, left shoulder ROM and strength and pain control.   Delon Darner, PTA 02/09/24 10:56 AM   [facility] [address] [place], [place] [address] Phone # [phone] Fax [phone]

## 2024-02-13 ENCOUNTER — Other Ambulatory Visit: Payer: Self-pay | Admitting: Cardiology

## 2024-02-14 ENCOUNTER — Ambulatory Visit: Admitting: Physical Therapy

## 2024-02-14 ENCOUNTER — Encounter: Payer: Self-pay | Admitting: Physical Therapy

## 2024-02-14 DIAGNOSIS — M6281 Muscle weakness (generalized): Secondary | ICD-10-CM | POA: Diagnosis not present

## 2024-02-14 DIAGNOSIS — R293 Abnormal posture: Secondary | ICD-10-CM

## 2024-02-14 DIAGNOSIS — G8929 Other chronic pain: Secondary | ICD-10-CM

## 2024-02-14 DIAGNOSIS — M47816 Spondylosis without myelopathy or radiculopathy, lumbar region: Secondary | ICD-10-CM

## 2024-02-14 DIAGNOSIS — R262 Difficulty in walking, not elsewhere classified: Secondary | ICD-10-CM

## 2024-02-14 DIAGNOSIS — M25521 Pain in right elbow: Secondary | ICD-10-CM

## 2024-02-14 DIAGNOSIS — R2681 Unsteadiness on feet: Secondary | ICD-10-CM

## 2024-02-14 DIAGNOSIS — M5459 Other low back pain: Secondary | ICD-10-CM

## 2024-02-14 NOTE — Therapy (Signed)
 OUTPATIENT PHYSICAL THERAPY UPPER EXTREMITY and LUMBAR TREATMENT NOTE   Progress Note Reporting Period 12/01/23 to 02/14/24  See note below for Objective Data and Assessment of Progress/Goals.      Patient Name: Michelle Johnston MRN: [id number] DOB:02-Apr-1950, 73 y.o., female Today's Date: 02/14/2024  END OF SESSION:  PT End of Session - 02/14/24 1016     Visit Number 20    Number of Visits 26    Date for Recertification  03/27/24    Authorization Type [employer] approved 12 visits 12/21/23-03/20/24    Authorization - Visit Number 6    Authorization - Number of Visits 12    Progress Note Due on Visit 30    PT Start Time 1016    PT Stop Time 1059    PT Time Calculation (min) 43 min    Activity Tolerance Patient tolerated treatment well    Behavior During Therapy [facility] for tasks assessed/performed            Past Medical History:  Diagnosis Date   Anxiety    Arthritis    Atrial fibrillation (HCC)    Bradycardia    Bronchitis    Depression    Dyspnea    GERD (gastroesophageal reflux disease)    Glaucoma    Hypertension    Presence of Watchman left atrial appendage closure device 08/07/2021   Watchman FLX 27mm with Dr. Cindie   Seasonal allergies    Sleep apnea    does not use CPAP   Wears glasses    Past Surgical History:  Procedure Laterality Date   ABDOMINAL HYSTERECTOMY  1990   ATRIAL FIBRILLATION ABLATION N/A 08/12/2020   Procedure: ATRIAL FIBRILLATION ABLATION;  Surgeon: Cindie Ole DASEN, MD;  Location: [facility];  Service: Cardiovascular;  Laterality: N/A;   ATRIAL FIBRILLATION ABLATION N/A 03/26/2023   Procedure: ATRIAL FIBRILLATION ABLATION;  Surgeon: Cindie Ole DASEN, MD;  Location: [facility];  Service: Cardiovascular;  Laterality: N/A;   BREAST BIOPSY Right    benign   CARDIOVERSION N/A 08/15/2019   Procedure: CARDIOVERSION;  Surgeon: Elmira Newman PARAS, MD;  Location: [facility];  Service: Cardiovascular;  Laterality: N/A;    COLONOSCOPY     LEFT ATRIAL APPENDAGE OCCLUSION N/A 08/07/2021   Procedure: LEFT ATRIAL APPENDAGE OCCLUSION;  Surgeon: Cindie Ole DASEN, MD;  Location: [facility];  Service: Cardiovascular;  Laterality: N/A;   meniscus tear knee     left knee   NASAL SINUS SURGERY  1975   ORIF ANKLE FRACTURE  2009   left   ORIF HUMERUS FRACTURE Right 07/15/2023   Procedure: OPEN REDUCTION INTERNAL FIXATION (ORIF) DISTAL HUMERUS FRACTURE;  Surgeon: Cristy Bonner DASEN, MD;  Location: [facility];  Service: Orthopedics;  Laterality: Right;   REVERSE SHOULDER ARTHROPLASTY Left 09/02/2023   Procedure: ARTHROPLASTY, SHOULDER, TOTAL, REVERSE;  Surgeon: Cristy Bonner DASEN, MD;  Location: [facility];  Service: Orthopedics;  Laterality: Left;   TEE WITHOUT CARDIOVERSION N/A 08/07/2021   Procedure: TRANSESOPHAGEAL ECHOCARDIOGRAM (TEE);  Surgeon: Cindie Ole DASEN, MD;  Location: [facility];  Service: Cardiovascular;  Laterality: N/A;   TOTAL KNEE ARTHROPLASTY Left 02/03/2019   Procedure: LEFT TOTAL KNEE ARTHROPLASTY;  Surgeon: Vernetta Lonni GRADE, MD;  Location: [facility];  Service: Orthopedics;  Laterality: Left;   TURBINATE REDUCTION Bilateral 06/04/2014   Procedure: BILATERAL TURBINATE REDUCTION;  Surgeon: Daniel Moccasin, MD;  Location: [facility];  Service: ENT;  Laterality: Bilateral;  TYMPANOSTOMY TUBE PLACEMENT     left   Patient Active Problem List   Diagnosis Date Noted   Near syncope 09/11/2023   Impacted cerumen of left ear 08/15/2023   Other specified disorders of eustachian tube, left ear 02/06/2023   Central perforation of tympanic membrane of left ear 02/06/2023   Presence of Watchman left atrial appendage closure device 08/07/2021   Atrial fibrillation (HCC) 08/07/2021   OSA (obstructive sleep apnea) 10/29/2020   Persistent atrial fibrillation (HCC) 03/27/2020   Hypercoagulable state due to persistent atrial fibrillation (HCC) 03/27/2020   PAD (peripheral artery disease)  12/27/2019   Exertional chest pain 12/27/2019   Primary osteoarthritis of right knee 07/12/2019   Paroxysmal atrial fibrillation (HCC) 06/16/2019   Nonrheumatic aortic valve insufficiency 06/16/2019   Nonrheumatic mitral valve regurgitation 06/16/2019   Tachycardia 02/20/2019   Abnormal EKG 02/20/2019   Essential hypertension 02/20/2019   Status post total left knee replacement 02/03/2019   Unilateral primary osteoarthritis, left knee 11/30/2016   Chronic pain of left knee 11/30/2016    PCP:   Loreli Kins, MD    REFERRING PROVIDER: Cristy Bonner DASEN, MD/ Margeret Kotyk, MD  REFERRING DIAG: left total shoulder arthoplasty ORIF tuberosities/ lumbar stenosis  THERAPY DIAG:  Muscle weakness (generalized)  Difficulty in walking, not elsewhere classified  Chronic left shoulder pain  Unsteadiness on feet  Other low back pain  Abnormal posture  Lumbar spondylosis  Pain in right elbow  Rationale for Evaluation and Treatment: Rehabilitation  ONSET DATE: Surgery 09/02/2023  SUBJECTIVE:                                                                                                                                                                                      SUBJECTIVE STATEMENT:  My little finger and ring finger are tingling on Lt side - that is new and constant.  I have Lt elbow pain -  exercise helps. I see the surgeon next week - the first Mon after Christmas. The pool therapy is really helping me tolerate a lot of exercise.  I still can't curl my hair due to it being too hard to hold my Lt arm overhead for very long.   POOL ACCESS: GAC - goes to water  aerobics 1-2x/wk  Hand dominance: Right  PERTINENT HISTORY: Anxiety; OA; Depression; HTN; Hx left TKA  PAIN:   Are you having pain? no  PRECAUTIONS: Shoulder and Fall see protocol in media  RED FLAGS:  None   WEIGHT BEARING RESTRICTIONS: No  FALLS:  Has patient fallen in last 6 months? Yes. Number of  falls 2 fall. Patient had two surgeries two weeks from each other  due to falling. She fell while she was at the grocery store. She was turning and her husband said her feet did not move with her. PT to address balance   LIVING ENVIRONMENT: Lives with: lives with their spouse Lives in: House/apartment Stairs: Yes: Internal: 12 steps; on left going up Has following equipment at home: Single point cane and Walker - 2 wheeled  OCCUPATION: Retired  PLOF: Independent, Independent with basic ADLs, Independent with household mobility without device, Independent with community mobility without device, Independent with gait, and Independent with transfers  PATIENT GOALS: use of my arm again. To get back to water  aerobics and drive  NEXT MD VISIT:   OBJECTIVE:  Note: Objective measures were completed at Evaluation unless otherwise noted.   PATIENT SURVEYS :  Initial eval QuickDASH : 52.3/100 52.3%  12/15/23: QuickDASH : 34.1/100 34.2%  01/31/24: QuickDASH : 43.2/100 43.2%   12/01/23 ODI: 20/50= 40%  12/15/23 ODI: 21/50= 42%  01/31/24: ODI: 21/50= 42%  02/14/24 ODI 21/50 = 42%  COGNITION: Overall cognitive status: Within functional limits for tasks assessed     SENSATION: WFL  POSTURE: Rounded shoulder & forward head Standing posture/ gait: right pelvic shift and left trunk lean  UPPER EXTREMITY ROM:   Passive ROM Right eval Left eval Left 11/17/23 Left 1015/25 Left 01/31/24 Left 12/15  Shoulder flexion  90 150 160 160 160  Shoulder extension  NT due to restrictions      Shoulder abduction  75 85 120 125 125  Shoulder adduction        Shoulder internal rotation  NT due to restrictions  70 WNL WNL  Shoulder external rotation  NT due to restrictions 22 60 C7 C7  Elbow flexion        Elbow extension        Wrist flexion        Wrist extension        Wrist ulnar deviation        Wrist radial deviation        Wrist pronation        Wrist supination         (Blank rows = not tested)  11/17/23 seated scaption  5 deg, flex 68 deg  UPPER EXTREMITY MMT:  02/14/24: Grip Rt: 5lb grip Lt: 15lb Lt shoulder: 4/5 all muscles except 0/5 ER  NT due to weight restrictions of 1lb  LUMBAR ROM: All WFL  LOWER EXTREMITY MMT: significant proximal hip weakness, quad 4-/5,  DF 4-/5      01/31/24: right hip flexor 4/5     LOWER EXTREMITY ROM:  FUNCTIONAL TESTING Initial eval: 5STS: 17.47 sec no UE support TUG:14.32 sec  12/01/23: 5STS: 19.78 sec no UE support TUG:12.06 sec SLS: severe trendelenburg but able to hold approx 2-3 sec Tandem stance: needs help to get into position but able to hold 2-3 sec   12/15/23 5STS: 21.96 sec no UE support TUG:16.40 sec with cane  01/31/24 5STS: 21.30 sec with UE support TUG:14.41 sec with cane  JOINT MOBILITY TESTING:  See PROM measurement above Open end feel  TREATMENT DATE:  [facility] Adult PT Treatment:                                              02/14/24 UBE 3x3 L2 PT present to review status Assessment of grip strength (weak on Rt), sensation of Rt hand (decreased on Rt 4th and 5th digit), +Tinels at Rt elbow at ulnar nerve Supine bent knee fall out x10 each side with TA indraw Supine hooklying march x20 with TA indraw Supine bridge x10 Supine from hooklying LE dead bug slow controlled legs only x8 Supine 2lb Lt shoulder flexion x6, then drop to 1lb w/o rest and did 15 more reps Supine hooklying bil serratus punch 2lb x20 Supine hooklying hip abd red loop band at knees - x20 Pt education: nerve flossing and technique/progression with ulnar nerve on Rt side Standing Lt green tband shoulder ext x10 and tricep pressdown x10 Ball on wall standing x10 circles each way Lt UE  02/09/24:Pt seen for aquatic therapy today.  Treatment took place in water  3.5-4.75 ft in depth at the  [facility] pool. Temp of water  was 91.  Pt entered/exited the pool via stairs independently with rail. Pt requires buoyancy of water  for support and to offload joints with strengthening exercises.   - 75% depth water  walking 10x in each direction with UE push/pull using rainbow wts. -high knee marching with rainbow floats for balance asst 4 lengths - suitcase carry single yellow hand floats under water  at side, walking forward 2x4 lengths on each side - 1/4 blue hollow noodles Bil hands for triceps extesion 2x8 - single arm holding green bell, shoulder circumductions 10x each direction Bil -standing with bil green bells forward back 2x10 --core/lat press standing against wall with rainbow floats 2x10 - 3 min underwater bicycle in corner with yellow noodle in horseback   01/31/24 Re-assessment completed Reviewed HEP Instructed in appropriate activities for staying generally fit including a walking program: gave a specific walking schedule for in the home, fall prevention Went over upcoming appointments scheduled  Date: 01/11/24 Pt seen for aquatic therapy today.  Treatment took place in water  3.5-4.75 ft in depth at the [facility] pool. Temp of water  was 91.  Pt entered/exited the pool via stairs independently with rail.  - unsupported walking forward/ backward, with reciprocal arm swing, multiple laps - unsupported side stepping -> with arm horz abdct/add with rainbow hand floats -> with arm add/abdct with rainbow hand floats - marching with reciprocal row - backwards / forwards  - suitcase carry with bil rainbow / single yellow hand floats under water  at side, walking forward and backward - UE on yellow hand floats:  toe/heel raises x 20; hip add/abd x10 ; hip flexion /extension x10;  - noodle stomp with hollow noodle - 10 slow/ 10 quick each LE, no ue support - SLS with opp arm holding hollow noodle under water  (small circles)  - tandem gait forward/backward with  UE On noodle -> then hands out of water  - no UE support: forward step up/retro step down x 10  - issued laminated HEP    PATIENT EDUCATION: Education details: PT eval findings, anticipated POC, progress with PT, and initial HEP Person educated: Patient Education method: Explanation, Demonstration, and Handouts Education comprehension: verbalized understanding, returned demonstration, and needs further education  HOME EXERCISE PROGRAM: Access Code: [email or web address] URL: [email or web address] Date: 12/15/2023  Prepared by: Delon Haddock  Exercises - Supine Shoulder Flexion with Dowel  - 1 x daily - 7 x weekly - 1-2 sets - 10 reps - Seated Scapular Retraction  - 1 x daily - 7 x weekly - 1 sets - 10 reps - Isometric Shoulder Flexion  - 1 x daily - 7 x weekly - 2 sets - 5 reps - 5 hold - Standing Isometric Shoulder Abduction with Doorway - Arm Bent  - 1 x daily - 7 x weekly - 2 sets - 5 reps - 5 hold - Seated Shoulder Flexion AAROM with Pulley Behind  - 1 x daily - 7 x weekly - 2 sets - 10 reps - Seated Shoulder Scaption AAROM with Pulley at Side  - 1 x daily - 7 x weekly - 2 sets - 10 reps - Standing Hip Abduction with Counter Support  - 1 x daily - 7 x weekly - 2 sets - 10 reps - Standing Hip Extension with Leg Bent and Support  - 1 x daily - 7 x weekly - 2 sets - 10 reps - Clam  - 1 x daily - 7 x weekly - 2 sets - 10 reps - Sidelying Hip Abduction  - 1 x daily - 7 x weekly - 2 sets - 10 reps  AQUATIC Access Code: [email or web address] URL: [email or web address] Prepared by: [facility] - [address] This aquatic home exercise program from Med[employer] utilizes pictures from land based exercises, but has been adapted prior to lamination and issuance.   ASSESSMENT:  CLINICAL IMPRESSION: Pt reports improving pain and strength results from aquatic PT.  She has 160 deg of Lt shoulder flexion although cannot maintain this to style hair due to weakness in overhead  positioning.  She has new onset of Rt grip weakness and numbness in Rt 4th and 5th digit with elbow pain and + Tinel's at elbow.  She sees Chief operating officer after the holidays and plans to ask about it.  PT instructed her in ulnar nerve mobilizations for now.  Pt appears to have improved frontal plane control with SPC with gait today.  OBJECTIVE IMPAIRMENTS: Abnormal gait, decreased balance, decreased ROM, decreased strength, hypomobility, increased muscle spasms, impaired flexibility, impaired UE functional use, postural dysfunction, and pain.   ACTIVITY LIMITATIONS: carrying, lifting, bending, squatting, stairs, bathing, dressing, reach over head, and hygiene/grooming  PARTICIPATION LIMITATIONS: meal prep, cleaning, laundry, interpersonal relationship, driving, shopping, and community activity  PERSONAL FACTORS: Age and 3+ comorbidities: falls risk HTN; anxiety; depression are also affecting patient's functional outcome.   REHAB POTENTIAL: Good  CLINICAL DECISION MAKING: Evolving/moderate complexity  EVALUATION COMPLEXITY: Moderate  GOALS: Goals reviewed with patient? Yes  SHORT TERM GOALS: Target date: 11/22/2023   Patient will be independent with initial HEP. Baseline:  Goal status: In Progress  2.  Patient will report > or = to 40% use of Lt UE with light home and self care tasks due to improved ROM and strength. Baseline:  Goal status: In Progress  3.  Patient will demonstrate correct seated and standing posture to reduce mechanical strain. Baseline:  Goal status: MET 12/15/23  4.  .  Patient will demonstrate > or = to 100 degrees in Lt  shoulder P/ROM for improved reaching. Baseline:  Goal status: MET 150 deg 11/17/23  5.  Patient to report ability to ambulate for at least 5 min without need to sit.  Baseline:  Goal status: MET 12/15/23  6. Patient to obtain proper assistive  device to reduce fall risk  Baseline: not using anything  Goals status: MET  12/15/23    LONG TERM GOALS: Target date: 02/09/2024  Patient will demonstrate independence in advanced HEP. Baseline:  Goal status: INITIAL  2.  .  Patient will report > or = to 70% of normal use of Rt UE for ADLs due to improved functional strength and A/ROM. Baseline:  Goal status: INITIAL  3.  Patient will be able to return to return water  aerobics program with no difficulty to establish normal exercise routine Baseline:  Goal status: INITIAL   4.  Patient will be able to wash dishes and fold clothes with < or = to 2/10 left shoulder pain to for improved functional use of extremity. Baseline: washing dishes much improved, folding clothes is painful Goal status:In progress - 01/11/24  5.  Patient will demonstrate > or = to 115 degrees in Rt shoulder A/ROM for improved reaching overhead. Baseline:  Goal status: INITIAL  6.  Patient's Annitta will be 64/100 for improved functional use of Lt UE and decreased disability.  Baseline: 52.3/100 Goal status: INITIAL  7. ODI to improve to 16/50  Baseline: 20/50  Goal status: Initial  8. Patient to be able to stand or walk for at least 15 min without rest  Baseline:   Goal status: Initial  PLAN: PT FREQUENCY: 1-2x/week  PT DURATION: 8 weeks  PLANNED INTERVENTIONS: [id number]- PT Re-evaluation, 97110-Therapeutic exercises, 97530- Therapeutic activity, 97112- Neuromuscular re-education, 97535- Self Care, [id number]- Manual therapy, [id number]- Gait training, [phone]- Canalith repositioning, [id number]- Aquatic Therapy, [phone]- Electrical stimulation (unattended), [phone]- Electrical stimulation (manual), [id number]- Vasopneumatic device, [id number]- Ultrasound, [id number]- Ionotophoresis 4mg /ml Dexamethasone , 79439 (1-2 muscles), [id number] (3+ muscles)- Dry Needling, Patient/Family education, Balance training, Stair training, Taping, Joint mobilization, Joint manipulation, Spinal manipulation, Spinal mobilization, Scar mobilization, Vestibular training, Cryotherapy, and  Moist heat  PLAN FOR NEXT SESSION:  Continue to work on strength, balance, fall prevention, left shoulder ROM and strength and pain control.   Orvil Fester, PT 02/14/2024 11:17 AM    [facility] [address] [place], [place] [address] Phone # [phone] Fax [phone]

## 2024-02-16 ENCOUNTER — Ambulatory Visit: Admitting: Physical Therapy

## 2024-02-16 ENCOUNTER — Encounter: Payer: Self-pay | Admitting: Physical Therapy

## 2024-02-16 ENCOUNTER — Ambulatory Visit

## 2024-02-16 DIAGNOSIS — M47816 Spondylosis without myelopathy or radiculopathy, lumbar region: Secondary | ICD-10-CM

## 2024-02-16 DIAGNOSIS — M25521 Pain in right elbow: Secondary | ICD-10-CM

## 2024-02-16 DIAGNOSIS — M6281 Muscle weakness (generalized): Secondary | ICD-10-CM

## 2024-02-16 DIAGNOSIS — G8929 Other chronic pain: Secondary | ICD-10-CM

## 2024-02-16 DIAGNOSIS — R2681 Unsteadiness on feet: Secondary | ICD-10-CM

## 2024-02-16 DIAGNOSIS — M5459 Other low back pain: Secondary | ICD-10-CM

## 2024-02-16 DIAGNOSIS — M25611 Stiffness of right shoulder, not elsewhere classified: Secondary | ICD-10-CM

## 2024-02-16 DIAGNOSIS — R252 Cramp and spasm: Secondary | ICD-10-CM

## 2024-02-16 DIAGNOSIS — R293 Abnormal posture: Secondary | ICD-10-CM

## 2024-02-16 DIAGNOSIS — M25612 Stiffness of left shoulder, not elsewhere classified: Secondary | ICD-10-CM

## 2024-02-16 DIAGNOSIS — R262 Difficulty in walking, not elsewhere classified: Secondary | ICD-10-CM

## 2024-02-16 NOTE — Therapy (Signed)
 OUTPATIENT PHYSICAL THERAPY UPPER EXTREMITY and LUMBAR TREATMENT NOTE    Patient Name: Michelle Johnston MRN: [id number] DOB:Sep 28, 1950, 73 y.o., female Today's Date: 02/16/2024  END OF SESSION:  PT End of Session - 02/16/24 0839     Visit Number 21    Number of Visits 26    Date for Recertification  03/27/24    Authorization Type [employer] approved 12 visits 12/21/23-03/20/24    Authorization - Visit Number 7    Authorization - Number of Visits 12    Progress Note Due on Visit 30    PT Start Time 0840    PT Stop Time 0920    PT Time Calculation (min) 40 min    Activity Tolerance Patient tolerated treatment well    Behavior During Therapy [facility] for tasks assessed/performed            Past Medical History:  Diagnosis Date   Anxiety    Arthritis    Atrial fibrillation (HCC)    Bradycardia    Bronchitis    Depression    Dyspnea    GERD (gastroesophageal reflux disease)    Glaucoma    Hypertension    Presence of Watchman left atrial appendage closure device 08/07/2021   Watchman FLX 27mm with Dr. Cindie   Seasonal allergies    Sleep apnea    does not use CPAP   Wears glasses    Past Surgical History:  Procedure Laterality Date   ABDOMINAL HYSTERECTOMY  1990   ATRIAL FIBRILLATION ABLATION N/A 08/12/2020   Procedure: ATRIAL FIBRILLATION ABLATION;  Surgeon: Cindie Ole DASEN, MD;  Location: [facility];  Service: Cardiovascular;  Laterality: N/A;   ATRIAL FIBRILLATION ABLATION N/A 03/26/2023   Procedure: ATRIAL FIBRILLATION ABLATION;  Surgeon: Cindie Ole DASEN, MD;  Location: [facility];  Service: Cardiovascular;  Laterality: N/A;   BREAST BIOPSY Right    benign   CARDIOVERSION N/A 08/15/2019   Procedure: CARDIOVERSION;  Surgeon: Elmira Newman PARAS, MD;  Location: [facility];  Service: Cardiovascular;  Laterality: N/A;   COLONOSCOPY     LEFT ATRIAL APPENDAGE OCCLUSION N/A 08/07/2021   Procedure: LEFT ATRIAL APPENDAGE OCCLUSION;  Surgeon: Cindie Ole DASEN, MD;  Location: [facility];  Service: Cardiovascular;  Laterality: N/A;   meniscus tear knee     left knee   NASAL SINUS SURGERY  1975   ORIF ANKLE FRACTURE  2009   left   ORIF HUMERUS FRACTURE Right 07/15/2023   Procedure: OPEN REDUCTION INTERNAL FIXATION (ORIF) DISTAL HUMERUS FRACTURE;  Surgeon: Cristy Bonner DASEN, MD;  Location: [facility];  Service: Orthopedics;  Laterality: Right;   REVERSE SHOULDER ARTHROPLASTY Left 09/02/2023   Procedure: ARTHROPLASTY, SHOULDER, TOTAL, REVERSE;  Surgeon: Cristy Bonner DASEN, MD;  Location: [facility];  Service: Orthopedics;  Laterality: Left;   TEE WITHOUT CARDIOVERSION N/A 08/07/2021   Procedure: TRANSESOPHAGEAL ECHOCARDIOGRAM (TEE);  Surgeon: Cindie Ole DASEN, MD;  Location: [facility];  Service: Cardiovascular;  Laterality: N/A;   TOTAL KNEE ARTHROPLASTY Left 02/03/2019   Procedure: LEFT TOTAL KNEE ARTHROPLASTY;  Surgeon: Vernetta Lonni GRADE, MD;  Location: [facility];  Service: Orthopedics;  Laterality: Left;   TURBINATE REDUCTION Bilateral 06/04/2014   Procedure: BILATERAL TURBINATE REDUCTION;  Surgeon: Daniel Moccasin, MD;  Location: [facility];  Service: ENT;  Laterality: Bilateral;   TYMPANOSTOMY TUBE PLACEMENT     left   Patient Active Problem List   Diagnosis Date Noted  Near syncope 09/11/2023   Impacted cerumen of left ear 08/15/2023   Other specified disorders of eustachian tube, left ear 02/06/2023   Central perforation of tympanic membrane of left ear 02/06/2023   Presence of Watchman left atrial appendage closure device 08/07/2021   Atrial fibrillation (HCC) 08/07/2021   OSA (obstructive sleep apnea) 10/29/2020   Persistent atrial fibrillation (HCC) 03/27/2020   Hypercoagulable state due to persistent atrial fibrillation (HCC) 03/27/2020   PAD (peripheral artery disease) 12/27/2019   Exertional chest pain 12/27/2019   Primary osteoarthritis of right knee 07/12/2019   Paroxysmal atrial fibrillation  (HCC) 06/16/2019   Nonrheumatic aortic valve insufficiency 06/16/2019   Nonrheumatic mitral valve regurgitation 06/16/2019   Tachycardia 02/20/2019   Abnormal EKG 02/20/2019   Essential hypertension 02/20/2019   Status post total left knee replacement 02/03/2019   Unilateral primary osteoarthritis, left knee 11/30/2016   Chronic pain of left knee 11/30/2016    PCP:   Loreli Kins, MD    REFERRING PROVIDER: Cristy Bonner DASEN, MD/ Margeret Kotyk, MD  REFERRING DIAG: left total shoulder arthoplasty ORIF tuberosities/ lumbar stenosis  THERAPY DIAG:  Muscle weakness (generalized)  Chronic left shoulder pain  Difficulty in walking, not elsewhere classified  Other low back pain  Unsteadiness on feet  Lumbar spondylosis  Abnormal posture  Pain in right elbow  Cramp and spasm  Stiffness of right shoulder, not elsewhere classified  Stiffness of left shoulder, not elsewhere classified  Rationale for Evaluation and Treatment: Rehabilitation  ONSET DATE: Surgery 09/02/2023  SUBJECTIVE:                                                                                                                                                                                      SUBJECTIVE STATEMENT: Elbow still bothersome. Exercises in the pool last time did not exacerbate. I went to the [facility] the other day, first time in over 2 months.   POOL ACCESS: [facility] - goes to water  aerobics 1-2x/wk  Hand dominance: Right  PERTINENT HISTORY: Anxiety; OA; Depression; HTN; Hx left TKA  PAIN:   Are you having pain? no  PRECAUTIONS: Shoulder and Fall see protocol in media  RED FLAGS:  None   WEIGHT BEARING RESTRICTIONS: No  FALLS:  Has patient fallen in last 6 months? Yes. Number of falls 2 fall. Patient had two surgeries two weeks from each other due to falling. She fell while she was at the grocery store. She was turning and her husband said her feet did not move with her. PT to address  balance   LIVING ENVIRONMENT: Lives with: lives with their spouse Lives in: House/apartment Stairs: Yes: Internal: 12 steps; on  left going up Has following equipment at home: Single point cane and Walker - 2 wheeled  OCCUPATION: Retired  PLOF: Independent, Independent with basic ADLs, Independent with household mobility without device, Independent with community mobility without device, Independent with gait, and Independent with transfers  PATIENT GOALS: use of my arm again. To get back to water  aerobics and drive  NEXT MD VISIT:   OBJECTIVE:  Note: Objective measures were completed at Evaluation unless otherwise noted.   PATIENT SURVEYS :  Initial eval QuickDASH : 52.3/100 52.3%  12/15/23: QuickDASH : 34.1/100 34.2%  01/31/24: QuickDASH : 43.2/100 43.2%   12/01/23 ODI: 20/50= 40%  12/15/23 ODI: 21/50= 42%  01/31/24: ODI: 21/50= 42%  02/14/24 ODI 21/50 = 42%  COGNITION: Overall cognitive status: Within functional limits for tasks assessed     SENSATION: WFL  POSTURE: Rounded shoulder & forward head Standing posture/ gait: right pelvic shift and left trunk lean  UPPER EXTREMITY ROM:   Passive ROM Right eval Left eval Left 11/17/23 Left 1015/25 Left 01/31/24 Left 12/15  Shoulder flexion  90 150 160 160 160  Shoulder extension  NT due to restrictions      Shoulder abduction  75 85 120 125 125  Shoulder adduction        Shoulder internal rotation  NT due to restrictions  70 WNL WNL  Shoulder external rotation  NT due to restrictions 22 60 C7 C7  Elbow flexion        Elbow extension        Wrist flexion        Wrist extension        Wrist ulnar deviation        Wrist radial deviation        Wrist pronation        Wrist supination        (Blank rows = not tested)  11/17/23 seated scaption  5 deg, flex 68 deg  UPPER EXTREMITY MMT:  02/14/24: Grip Rt: 5lb grip Lt: 15lb Lt shoulder: 4/5 all muscles except 0/5 ER  NT due to weight restrictions  of 1lb  LUMBAR ROM: All WFL  LOWER EXTREMITY MMT: significant proximal hip weakness, quad 4-/5,  DF 4-/5      01/31/24: right hip flexor 4/5     LOWER EXTREMITY ROM:  FUNCTIONAL TESTING Initial eval: 5STS: 17.47 sec no UE support TUG:14.32 sec  12/01/23: 5STS: 19.78 sec no UE support TUG:12.06 sec SLS: severe trendelenburg but able to hold approx 2-3 sec Tandem stance: needs help to get into position but able to hold 2-3 sec   12/15/23 5STS: 21.96 sec no UE support TUG:16.40 sec with cane  01/31/24 5STS: 21.30 sec with UE support TUG:14.41 sec with cane  JOINT MOBILITY TESTING:  See PROM measurement above Open end feel  TREATMENT DATE:  [facility] Adult PT Treatment:    02/16/24:Pt seen for aquatic therapy today.  Treatment took place in water  3.5-4.75 ft in depth at the [facility] pool. Temp of water  was 91.  Pt entered/exited the pool via stairs independently with rail. Pt requires buoyancy of water  for support and to offload joints with strengthening exercises.   - 75% depth water  walking 10x in each direction with UE push/pull using rainbow wts. -high knee marching with rainbow floats for balance asst 6 lengths - suitcase carry single yellow hand floats under water  at side, walking backward 2x4 lengths on each side - 1/4 blue hollow noodles Bil hands for triceps extesion 2x10 - single arm holding green bell, shoulder circumductions 10x each direction Bil, then forward/bk 20x -standing with bil green bells forward back 2x10 --core/lat press standing against wall with rainbow floats 2x10 - 5 min underwater bicycle in corner with yellow noodle in horseback    02/14/24 UBE 3x3 L2 PT present to review status Assessment of grip strength (weak on Rt), sensation of Rt hand (decreased on Rt 4th and 5th digit), +Tinels at Rt elbow at ulnar  nerve Supine bent knee fall out x10 each side with TA indraw Supine hooklying march x20 with TA indraw Supine bridge x10 Supine from hooklying LE dead bug slow controlled legs only x8 Supine 2lb Lt shoulder flexion x6, then drop to 1lb w/o rest and did 15 more reps Supine hooklying bil serratus punch 2lb x20 Supine hooklying hip abd red loop band at knees - x20 Pt education: nerve flossing and technique/progression with ulnar nerve on Rt side Standing Lt green tband shoulder ext x10 and tricep pressdown x10 Ball on wall standing x10 circles each way Lt UE  02/09/24:Pt seen for aquatic therapy today.  Treatment took place in water  3.5-4.75 ft in depth at the [facility] pool. Temp of water  was 91.  Pt entered/exited the pool via stairs independently with rail. Pt requires buoyancy of water  for support and to offload joints with strengthening exercises.   - 75% depth water  walking 10x in each direction with UE push/pull using rainbow wts. -high knee marching with rainbow floats for balance asst 4 lengths - suitcase carry single yellow hand floats under water  at side, walking forward 2x4 lengths on each side - 1/4 blue hollow noodles Bil hands for triceps extesion 2x8 - single arm holding green bell, shoulder circumductions 10x each direction Bil -standing with bil green bells forward back 2x10 --core/lat press standing against wall with rainbow floats 2x10 - 3 min underwater bicycle in corner with yellow noodle in horseback   PATIENT EDUCATION: Education details: PT eval findings, anticipated POC, progress with PT, and initial HEP Person educated: Patient Education method: Explanation, Demonstration, and Handouts Education comprehension: verbalized understanding, returned demonstration, and needs further education  HOME EXERCISE PROGRAM: Access Code: [email or web address] URL: [email or web address] Date: 12/15/2023 Prepared by: Delon Haddock  Exercises - Supine  Shoulder Flexion with Dowel  - 1 x daily - 7 x weekly - 1-2 sets - 10 reps - Seated Scapular Retraction  - 1 x daily - 7 x weekly - 1 sets - 10 reps - Isometric Shoulder Flexion  - 1 x daily - 7 x weekly - 2 sets - 5 reps - 5 hold - Standing Isometric Shoulder Abduction with Doorway - Arm Bent  - 1 x daily - 7 x weekly - 2 sets - 5 reps - 5 hold - Seated Shoulder Flexion AAROM with Pulley Behind  -  1 x daily - 7 x weekly - 2 sets - 10 reps - Seated Shoulder Scaption AAROM with Pulley at Side  - 1 x daily - 7 x weekly - 2 sets - 10 reps - Standing Hip Abduction with Counter Support  - 1 x daily - 7 x weekly - 2 sets - 10 reps - Standing Hip Extension with Leg Bent and Support  - 1 x daily - 7 x weekly - 2 sets - 10 reps - Clam  - 1 x daily - 7 x weekly - 2 sets - 10 reps - Sidelying Hip Abduction  - 1 x daily - 7 x weekly - 2 sets - 10 reps  AQUATIC Access Code: [email or web address] URL: [email or web address] Prepared by: [facility] [address] This aquatic home exercise program from MedBridge utilizes pictures from land based exercises, but has been adapted prior to lamination and issuance.   ASSESSMENT:  CLINICAL IMPRESSION: Pt arrives with mild elbow pain and just tired legs. We were able to increase her workload a bit without overdoing it. Pt was bale to return to the [facility] 1x over the weekend with success. That pool will be closed for next two weeks for maintenance. No increased pain in elbow or arm/shoulder during exercises today.   OBJECTIVE IMPAIRMENTS: Abnormal gait, decreased balance, decreased ROM, decreased strength, hypomobility, increased muscle spasms, impaired flexibility, impaired UE functional use, postural dysfunction, and pain.   ACTIVITY LIMITATIONS: carrying, lifting, bending, squatting, stairs, bathing, dressing, reach over head, and hygiene/grooming  PARTICIPATION LIMITATIONS: meal prep, cleaning, laundry, interpersonal relationship, driving,  shopping, and community activity  PERSONAL FACTORS: Age and 3+ comorbidities: falls risk HTN; anxiety; depression are also affecting patient's functional outcome.   REHAB POTENTIAL: Good  CLINICAL DECISION MAKING: Evolving/moderate complexity  EVALUATION COMPLEXITY: Moderate  GOALS: Goals reviewed with patient? Yes  SHORT TERM GOALS: Target date: 11/22/2023   Patient will be independent with initial HEP. Baseline:  Goal status: In Progress  2.  Patient will report > or = to 40% use of Lt UE with light home and self care tasks due to improved ROM and strength. Baseline:  Goal status: In Progress  3.  Patient will demonstrate correct seated and standing posture to reduce mechanical strain. Baseline:  Goal status: MET 12/15/23  4.  .  Patient will demonstrate > or = to 100 degrees in Lt  shoulder P/ROM for improved reaching. Baseline:  Goal status: MET 150 deg 11/17/23  5.  Patient to report ability to ambulate for at least 5 min without need to sit.  Baseline:  Goal status: MET 12/15/23  6. Patient to obtain proper assistive device to reduce fall risk  Baseline: not using anything  Goals status: MET 12/15/23    LONG TERM GOALS: Target date: 02/09/2024  Patient will demonstrate independence in advanced HEP. Baseline:  Goal status: INITIAL  2.  .  Patient will report > or = to 70% of normal use of Rt UE for ADLs due to improved functional strength and A/ROM. Baseline:  Goal status: INITIAL  3.  Patient will be able to return to return water  aerobics program with no difficulty to establish normal exercise routine Baseline:  Goal status: INITIAL   4.  Patient will be able to wash dishes and fold clothes with < or = to 2/10 left shoulder pain to for improved functional use of extremity. Baseline: washing dishes much improved, folding clothes is painful Goal status:In progress - 01/11/24  5.  Patient will demonstrate > or = to 115 degrees in Rt shoulder A/ROM for  improved reaching overhead. Baseline:  Goal status: INITIAL  6.  Patient's Annitta will be 64/100 for improved functional use of Lt UE and decreased disability.  Baseline: 52.3/100 Goal status: INITIAL  7. ODI to improve to 16/50  Baseline: 20/50  Goal status: Initial  8. Patient to be able to stand or walk for at least 15 min without rest  Baseline:   Goal status: Initial  PLAN: PT FREQUENCY: 1-2x/week  PT DURATION: 8 weeks  PLANNED INTERVENTIONS: 97164- PT Re-evaluation, 97110-Therapeutic exercises, 97530- Therapeutic activity, 97112- Neuromuscular re-education, 97535- Self Care, [id number]- Manual therapy, [id number]- Gait training, [phone]- Canalith repositioning, [id number]- Aquatic Therapy, [phone]- Electrical stimulation (unattended), [phone]- Electrical stimulation (manual), [id number]- Vasopneumatic device, [id number]- Ultrasound, [id number]- Ionotophoresis 4mg /ml Dexamethasone , 79439 (1-2 muscles), 20561 (3+ muscles)- Dry Needling, Patient/Family education, Balance training, Stair training, Taping, Joint mobilization, Joint manipulation, Spinal manipulation, Spinal mobilization, Scar mobilization, Vestibular training, Cryotherapy, and Moist heat  PLAN FOR NEXT SESSION:  Continue to work on strength, balance, fall prevention, left shoulder ROM and strength and pain control.   Delon Darner, PTA 02/16/2024 9:17 AM    [facility] [address], [address] [place], [place] [address] Phone # [phone] Fax [phone]

## 2024-02-21 NOTE — Telephone Encounter (Signed)
Called patient and unable to leave message

## 2024-02-22 ENCOUNTER — Ambulatory Visit

## 2024-02-29 ENCOUNTER — Ambulatory Visit

## 2024-03-01 ENCOUNTER — Ambulatory Visit: Admitting: Physical Therapy

## 2024-03-01 ENCOUNTER — Encounter: Payer: Self-pay | Admitting: Physical Therapy

## 2024-03-01 DIAGNOSIS — R293 Abnormal posture: Secondary | ICD-10-CM

## 2024-03-01 DIAGNOSIS — M47816 Spondylosis without myelopathy or radiculopathy, lumbar region: Secondary | ICD-10-CM

## 2024-03-01 DIAGNOSIS — G8929 Other chronic pain: Secondary | ICD-10-CM

## 2024-03-01 DIAGNOSIS — M25611 Stiffness of right shoulder, not elsewhere classified: Secondary | ICD-10-CM

## 2024-03-01 DIAGNOSIS — M6281 Muscle weakness (generalized): Secondary | ICD-10-CM

## 2024-03-01 DIAGNOSIS — M25612 Stiffness of left shoulder, not elsewhere classified: Secondary | ICD-10-CM

## 2024-03-01 DIAGNOSIS — M25521 Pain in right elbow: Secondary | ICD-10-CM

## 2024-03-01 DIAGNOSIS — R2681 Unsteadiness on feet: Secondary | ICD-10-CM

## 2024-03-01 DIAGNOSIS — M5459 Other low back pain: Secondary | ICD-10-CM

## 2024-03-01 DIAGNOSIS — R262 Difficulty in walking, not elsewhere classified: Secondary | ICD-10-CM

## 2024-03-01 DIAGNOSIS — R252 Cramp and spasm: Secondary | ICD-10-CM

## 2024-03-01 NOTE — Therapy (Signed)
 "     OUTPATIENT PHYSICAL THERAPY UPPER EXTREMITY and LUMBAR TREATMENT NOTE    Patient Name: Michelle Johnston MRN: [id number] DOB:[birth year], 73 y.o., female Today's Date: 03/01/2024  END OF SESSION:  PT End of Session - 03/01/24 1008     Visit Number 22    Number of Visits 26    Date for Recertification  03/27/24    Authorization Type [employer] approved 12 visits 12/21/23-03/20/24    Authorization - Visit Number 8    Authorization - Number of Visits 12    Progress Note Due on Visit 30    PT Start Time 1010    PT Stop Time 1058    PT Time Calculation (min) 48 min    Activity Tolerance Patient tolerated treatment well    Behavior During Therapy [facility] for tasks assessed/performed            Past Medical History:  Diagnosis Date   Anxiety    Arthritis    Atrial fibrillation (HCC)    Bradycardia    Bronchitis    Depression    Dyspnea    GERD (gastroesophageal reflux disease)    Glaucoma    Hypertension    Presence of Watchman left atrial appendage closure device 08/07/2021   Watchman FLX 27mm with Dr. Cindie   Seasonal allergies    Sleep apnea    does not use CPAP   Wears glasses    Past Surgical History:  Procedure Laterality Date   ABDOMINAL HYSTERECTOMY  1990   ATRIAL FIBRILLATION ABLATION N/A 08/12/2020   Procedure: ATRIAL FIBRILLATION ABLATION;  Surgeon: Cindie Ole DASEN, MD;  Location: [facility];  Service: Cardiovascular;  Laterality: N/A;   ATRIAL FIBRILLATION ABLATION N/A 03/26/2023   Procedure: ATRIAL FIBRILLATION ABLATION;  Surgeon: Cindie Ole DASEN, MD;  Location: [facility];  Service: Cardiovascular;  Laterality: N/A;   BREAST BIOPSY Right    benign   CARDIOVERSION N/A 08/15/2019   Procedure: CARDIOVERSION;  Surgeon: Elmira Newman PARAS, MD;  Location: [facility];  Service: Cardiovascular;  Laterality: N/A;   COLONOSCOPY     LEFT ATRIAL APPENDAGE OCCLUSION N/A 08/07/2021   Procedure: LEFT ATRIAL APPENDAGE OCCLUSION;  Surgeon: Cindie Ole DASEN, MD;  Location: [facility];  Service: Cardiovascular;  Laterality: N/A;   meniscus tear knee     left knee   NASAL SINUS SURGERY  1975   ORIF ANKLE FRACTURE  2009   left   ORIF HUMERUS FRACTURE Right 07/15/2023   Procedure: OPEN REDUCTION INTERNAL FIXATION (ORIF) DISTAL HUMERUS FRACTURE;  Surgeon: Cristy Bonner DASEN, MD;  Location: [facility];  Service: Orthopedics;  Laterality: Right;   REVERSE SHOULDER ARTHROPLASTY Left 09/02/2023   Procedure: ARTHROPLASTY, SHOULDER, TOTAL, REVERSE;  Surgeon: Cristy Bonner DASEN, MD;  Location: [facility];  Service: Orthopedics;  Laterality: Left;   TEE WITHOUT CARDIOVERSION N/A 08/07/2021   Procedure: TRANSESOPHAGEAL ECHOCARDIOGRAM (TEE);  Surgeon: Cindie Ole DASEN, MD;  Location: [facility] INV[facility] CV LAB;  Service: Cardiovascular;  Laterality: N/A;   TOTAL KNEE ARTHROPLASTY Left 02/03/2019   Procedure: LEFT TOTAL KNEE ARTHROPLASTY;  Surgeon: Vernetta Lonni GRADE, MD;  Location: [facility];  Service: Orthopedics;  Laterality: Left;   TURBINATE REDUCTION Bilateral 06/04/2014   Procedure: BILATERAL TURBINATE REDUCTION;  Surgeon: Daniel Moccasin, MD;  Location: [facility];  Service: ENT;  Laterality: Bilateral;   TYMPANOSTOMY TUBE PLACEMENT     left   Patient Active Problem List   Diagnosis Date Noted  Near syncope 09/11/2023   Impacted cerumen of left ear 08/15/2023   Other specified disorders of eustachian tube, left ear 02/06/2023   Central perforation of tympanic membrane of left ear 02/06/2023   Presence of Watchman left atrial appendage closure device 08/07/2021   Atrial fibrillation (HCC) 08/07/2021   OSA (obstructive sleep apnea) 10/29/2020   Persistent atrial fibrillation (HCC) 03/27/2020   Hypercoagulable state due to persistent atrial fibrillation (HCC) 03/27/2020   PAD (peripheral artery disease) 12/27/2019   Exertional chest pain 12/27/2019   Primary osteoarthritis of right knee 07/12/2019   Paroxysmal atrial fibrillation  (HCC) 06/16/2019   Nonrheumatic aortic valve insufficiency 06/16/2019   Nonrheumatic mitral valve regurgitation 06/16/2019   Tachycardia 02/20/2019   Abnormal EKG 02/20/2019   Essential hypertension 02/20/2019   Status post total left knee replacement 02/03/2019   Unilateral primary osteoarthritis, left knee 11/30/2016   Chronic pain of left knee 11/30/2016    PCP:   Loreli Kins, MD    REFERRING PROVIDER: Cristy Bonner DASEN, MD/ Margeret Kotyk, MD  REFERRING DIAG: left total shoulder arthoplasty ORIF tuberosities/ lumbar stenosis  THERAPY DIAG:  Muscle weakness (generalized)  Chronic left shoulder pain  Other low back pain  Unsteadiness on feet  Lumbar spondylosis  Abnormal posture  Pain in right elbow  Stiffness of right shoulder, not elsewhere classified  Cramp and spasm  Difficulty in walking, not elsewhere classified  Stiffness of left shoulder, not elsewhere classified  Rationale for Evaluation and Treatment: Rehabilitation  ONSET DATE: Surgery 09/02/2023  SUBJECTIVE:                                                                                                                                                                                      SUBJECTIVE STATEMENT: I have not been exercising like I should over the holiday break, but I am ready to get more consistent. I am on prednisone for my elbow. Currently I feel pretty good.  POOL ACCESS: GAC - goes to water  aerobics 1-2x/wk  Hand dominance: Right  PERTINENT HISTORY: Anxiety; OA; Depression; HTN; Hx left TKA  PAIN:   Are you having pain? no  PRECAUTIONS: Shoulder and Fall see protocol in media  RED FLAGS:  None   WEIGHT BEARING RESTRICTIONS: No  FALLS:  Has patient fallen in last 6 months? Yes. Number of falls 2 fall. Patient had two surgeries two weeks from each other due to falling. She fell while she was at the grocery store. She was turning and her husband said her feet did not move  with her. PT to address balance   LIVING ENVIRONMENT: Lives with: lives with their spouse Lives in: House/apartment Stairs:  Yes: Internal: 12 steps; on left going up Has following equipment at home: Single point cane and Walker - 2 wheeled  OCCUPATION: Retired  PLOF: Independent, Independent with basic ADLs, Independent with household mobility without device, Independent with community mobility without device, Independent with gait, and Independent with transfers  PATIENT GOALS: use of my arm again. To get back to water  aerobics and drive  NEXT MD VISIT:   OBJECTIVE:  Note: Objective measures were completed at Evaluation unless otherwise noted.   PATIENT SURVEYS :  Initial eval QuickDASH : 52.3/100 52.3%  12/15/23: QuickDASH : 34.1/100 34.2%  01/31/24: QuickDASH : 43.2/100 43.2%   12/01/23 ODI: 20/50= 40%  12/15/23 ODI: 21/50= 42%  01/31/24: ODI: 21/50= 42%  02/14/24 ODI 21/50 = 42%  COGNITION: Overall cognitive status: Within functional limits for tasks assessed     SENSATION: WFL  POSTURE: Rounded shoulder & forward head Standing posture/ gait: right pelvic shift and left trunk lean  UPPER EXTREMITY ROM:   Passive ROM Right eval Left eval Left 11/17/23 Left 1015/25 Left 01/31/24 Left 12/15  Shoulder flexion  90 150 160 160 160  Shoulder extension  NT due to restrictions      Shoulder abduction  75 85 120 125 125  Shoulder adduction        Shoulder internal rotation  NT due to restrictions  70 WNL WNL  Shoulder external rotation  NT due to restrictions 22 60 C7 C7  Elbow flexion        Elbow extension        Wrist flexion        Wrist extension        Wrist ulnar deviation        Wrist radial deviation        Wrist pronation        Wrist supination        (Blank rows = not tested)  11/17/23 seated scaption  5 deg, flex 68 deg  UPPER EXTREMITY MMT:  02/14/24: Grip Rt: 5lb grip Lt: 15lb Lt shoulder: 4/5 all muscles except 0/5 ER  NT  due to weight restrictions of 1lb  LUMBAR ROM: All WFL  LOWER EXTREMITY MMT: significant proximal hip weakness, quad 4-/5,  DF 4-/5      01/31/24: right hip flexor 4/5     LOWER EXTREMITY ROM:  FUNCTIONAL TESTING Initial eval: 5STS: 17.47 sec no UE support TUG:14.32 sec  12/01/23: 5STS: 19.78 sec no UE support TUG:12.06 sec SLS: severe trendelenburg but able to hold approx 2-3 sec Tandem stance: needs help to get into position but able to hold 2-3 sec   12/15/23 5STS: 21.96 sec no UE support TUG:16.40 sec with cane  01/31/24 5STS: 21.30 sec with UE support TUG:14.41 sec with cane  JOINT MOBILITY TESTING:  See PROM measurement above Open end feel  TREATMENT DATE:  [facility] Adult PT Treatment:    03/01/24: Pt seen for aquatic therapy today.  Treatment took place in water  3.5-4.75 ft in depth at the [facility] pool. Temp of water  was 91.  Pt entered/exited the pool via stairs independently with rail. Pt requires buoyancy of water  for support and to offload joints with strengthening exercises.  Seated water  bench with 75% submersion Pt performed seated LE AROM exercises 20x in all planes while discussing her status and pain. - 75% depth water  walking 10x in each direction, just natural arms by her side were better for elbow. -high knee marching arms by her side. 6 lengths - single arm holding green bell for first 5 reps then elbow had some pain so we completed with just water  resistane , no bell. 2x10 - 6 min underwater bicycle in corner with yellow noodle in horseback -Front and side step ups to second step 10x Bil holding onto railings  02/16/24:Pt seen for aquatic therapy today.  Treatment took place in water  3.5-4.75 ft in depth at the [facility] pool. Temp of water  was 91.  Pt entered/exited the pool via stairs independently  with rail. Pt requires buoyancy of water  for support and to offload joints with strengthening exercises.   - 75% depth water  walking 10x in each direction with UE push/pull using rainbow wts. -high knee marching with rainbow floats for balance asst 6 lengths - suitcase carry single yellow hand floats under water  at side, walking backward 2x4 lengths on each side - 1/4 blue hollow noodles Bil hands for triceps extesion 2x10 - single arm holding green bell, shoulder circumductions 10x each direction Bil, then forward/bk 20x -standing with bil green bells forward back 2x10 --core/lat press standing against wall with rainbow floats 2x10 - 5 min underwater bicycle in corner with yellow noodle in horseback    02/14/24 UBE 3x3 L2 PT present to review status Assessment of grip strength (weak on Rt), sensation of Rt hand (decreased on Rt 4th and 5th digit), +Tinels at Rt elbow at ulnar nerve Supine bent knee fall out x10 each side with TA indraw Supine hooklying march x20 with TA indraw Supine bridge x10 Supine from hooklying LE dead bug slow controlled legs only x8 Supine 2lb Lt shoulder flexion x6, then drop to 1lb w/o rest and did 15 more reps Supine hooklying bil serratus punch 2lb x20 Supine hooklying hip abd red loop band at knees - x20 Pt education: nerve flossing and technique/progression with ulnar nerve on Rt side Standing Lt green tband shoulder ext x10 and tricep pressdown x10 Ball on wall standing x10 circles each way Lt UE  PATIENT EDUCATION: Education details: PT eval findings, anticipated POC, progress with PT, and initial HEP Person educated: Patient Education method: Explanation, Demonstration, and Handouts Education comprehension: verbalized understanding, returned demonstration, and needs further education  HOME EXERCISE PROGRAM: Access Code: [email or web address] URL: [email or web address] Date: 12/15/2023 Prepared by: Delon Haddock  Exercises - Supine Shoulder  Flexion with Dowel  - 1 x daily - 7 x weekly - 1-2 sets - 10 reps - Seated Scapular Retraction  - 1 x daily - 7 x weekly - 1 sets - 10 reps - Isometric Shoulder Flexion  - 1 x daily - 7 x weekly - 2 sets - 5 reps - 5 hold - Standing Isometric Shoulder Abduction with Doorway - Arm Bent  - 1 x daily - 7 x weekly - 2 sets - 5 reps - 5 hold - Seated Shoulder Flexion  AAROM with Pulley Behind  - 1 x daily - 7 x weekly - 2 sets - 10 reps - Seated Shoulder Scaption AAROM with Pulley at Side  - 1 x daily - 7 x weekly - 2 sets - 10 reps - Standing Hip Abduction with Counter Support  - 1 x daily - 7 x weekly - 2 sets - 10 reps - Standing Hip Extension with Leg Bent and Support  - 1 x daily - 7 x weekly - 2 sets - 10 reps - Clam  - 1 x daily - 7 x weekly - 2 sets - 10 reps - Sidelying Hip Abduction  - 1 x daily - 7 x weekly - 2 sets - 10 reps  AQUATIC Access Code: [email or web address] URL: [email or web address] Prepared by: [facility] - [address] This aquatic home exercise program from [employer] utilizes pictures from land based exercises, but has been adapted prior to lamination and issuance.   ASSESSMENT:  CLINICAL IMPRESSION: Pt had her Rt elbow looked at. PA reports ulnar nerve neuritis  and degenerative cervical spine. PA has sent order to add to her POC. We avoided or modified any exercises that produced pain at her elbow. Pt understood what we changed so that she can also replicate at the [facility]. Pt is going to put more effort into activity/exercise balance post holiday. She had no pain exercising in the water  today.  OBJECTIVE IMPAIRMENTS: Abnormal gait, decreased balance, decreased ROM, decreased strength, hypomobility, increased muscle spasms, impaired flexibility, impaired UE functional use, postural dysfunction, and pain.   ACTIVITY LIMITATIONS: carrying, lifting, bending, squatting, stairs, bathing, dressing, reach over head, and hygiene/grooming  PARTICIPATION  LIMITATIONS: meal prep, cleaning, laundry, interpersonal relationship, driving, shopping, and community activity  PERSONAL FACTORS: Age and 3+ comorbidities: falls risk HTN; anxiety; depression are also affecting patient's functional outcome.   REHAB POTENTIAL: Good  CLINICAL DECISION MAKING: Evolving/moderate complexity  EVALUATION COMPLEXITY: Moderate  GOALS: Goals reviewed with patient? Yes  SHORT TERM GOALS: Target date: 11/22/2023   Patient will be independent with initial HEP. Baseline:  Goal status: In Progress  2.  Patient will report > or = to 40% use of Lt UE with light home and self care tasks due to improved ROM and strength. Baseline:  Goal status: In Progress  3.  Patient will demonstrate correct seated and standing posture to reduce mechanical strain. Baseline:  Goal status: MET 12/15/23  4.  .  Patient will demonstrate > or = to 100 degrees in Lt  shoulder P/ROM for improved reaching. Baseline:  Goal status: MET 150 deg 11/17/23  5.  Patient to report ability to ambulate for at least 5 min without need to sit.  Baseline:  Goal status: MET 12/15/23  6. Patient to obtain proper assistive device to reduce fall risk  Baseline: not using anything  Goals status: MET 12/15/23    LONG TERM GOALS: Target date: 02/09/2024  Patient will demonstrate independence in advanced HEP. Baseline:  Goal status: INITIAL  2.  .  Patient will report > or = to 70% of normal use of Rt UE for ADLs due to improved functional strength and A/ROM. Baseline:  Goal status: INITIAL  3.  Patient will be able to return to return water  aerobics program with no difficulty to establish normal exercise routine Baseline:  Goal status: INITIAL   4.  Patient will be able to wash dishes and fold clothes with < or = to 2/10 left shoulder pain to for improved  functional use of extremity. Baseline: washing dishes much improved, folding clothes is painful Goal status:In progress -  01/11/24  5.  Patient will demonstrate > or = to 115 degrees in Rt shoulder A/ROM for improved reaching overhead. Baseline:  Goal status: INITIAL  6.  Patient's Annitta will be 64/100 for improved functional use of Lt UE and decreased disability.  Baseline: 52.3/100 Goal status: INITIAL  7. ODI to improve to 16/50  Baseline: 20/50  Goal status: Initial  8. Patient to be able to stand or walk for at least 15 min without rest  Baseline:   Goal status: Initial  PLAN: PT FREQUENCY: 1-2x/week  PT DURATION: 8 weeks  PLANNED INTERVENTIONS: [id number]- PT Re-evaluation, 97110-Therapeutic exercises, 97530- Therapeutic activity, 97112- Neuromuscular re-education, 97535- Self Care, [id number]- Manual therapy, [id number]- Gait training, [phone]- Canalith repositioning, [id number]- Aquatic Therapy, [phone]- Electrical stimulation (unattended), [phone]- Electrical stimulation (manual), [id number]- Vasopneumatic device, [id number]- Ultrasound, [id number]- Ionotophoresis 4mg /ml Dexamethasone , [id number] (1-2 muscles), [id number] (3+ muscles)- Dry Needling, Patient/Family education, Balance training, Stair training, Taping, Joint mobilization, Joint manipulation, Spinal manipulation, Spinal mobilization, Scar mobilization, Vestibular training, Cryotherapy, and Moist heat  PLAN FOR NEXT SESSION:  Continue to work on strength, balance, fall prevention, left shoulder ROM and strength and pain control.   Delon Darner, PTA 03/01/2024 12:35 PM    [facility] [address], [address] [place], [place] [address] Phone # [phone] Fax [phone]    "

## 2024-03-03 ENCOUNTER — Encounter: Payer: Self-pay | Admitting: Physical Therapy

## 2024-03-03 ENCOUNTER — Ambulatory Visit: Attending: Orthopaedic Surgery | Admitting: Physical Therapy

## 2024-03-03 DIAGNOSIS — M5412 Radiculopathy, cervical region: Secondary | ICD-10-CM | POA: Diagnosis present

## 2024-03-03 DIAGNOSIS — M25512 Pain in left shoulder: Secondary | ICD-10-CM | POA: Insufficient documentation

## 2024-03-03 DIAGNOSIS — M6281 Muscle weakness (generalized): Secondary | ICD-10-CM | POA: Insufficient documentation

## 2024-03-03 DIAGNOSIS — R293 Abnormal posture: Secondary | ICD-10-CM | POA: Insufficient documentation

## 2024-03-03 DIAGNOSIS — M5459 Other low back pain: Secondary | ICD-10-CM | POA: Insufficient documentation

## 2024-03-03 DIAGNOSIS — R2681 Unsteadiness on feet: Secondary | ICD-10-CM | POA: Insufficient documentation

## 2024-03-03 DIAGNOSIS — G8929 Other chronic pain: Secondary | ICD-10-CM | POA: Diagnosis present

## 2024-03-03 DIAGNOSIS — M47816 Spondylosis without myelopathy or radiculopathy, lumbar region: Secondary | ICD-10-CM | POA: Diagnosis present

## 2024-03-03 DIAGNOSIS — M542 Cervicalgia: Secondary | ICD-10-CM | POA: Diagnosis present

## 2024-03-03 NOTE — Therapy (Signed)
 "     OUTPATIENT PHYSICAL THERAPY CERVICAL ASSESSMENT - NEW PER MD REFERRAL CONTINUED - UPPER EXTREMITY and LUMBAR    Patient Name: Michelle Johnston MRN: [id number] DOB:Jan 16, 1951, 74 y.o., female Today's Date: 03/03/2024  END OF SESSION:  PT End of Session - 03/03/24 1224     Visit Number 24    Number of Visits 26    Date for Recertification  05/02/24    Authorization Type [employer] approved 12 visits 12/21/23-03/20/24    Authorization Time Period submitting auth from 03/21/24  - 05/02/24    Authorization - Visit Number 9    Authorization - Number of Visits 12    Progress Note Due on Visit 30    PT Start Time 0930    PT Stop Time 1015    PT Time Calculation (min) 45 min    Activity Tolerance Patient limited by pain    Behavior During Therapy [facility] for tasks assessed/performed             Past Medical History:  Diagnosis Date   Anxiety    Arthritis    Atrial fibrillation (HCC)    Bradycardia    Bronchitis    Depression    Dyspnea    GERD (gastroesophageal reflux disease)    Glaucoma    Hypertension    Presence of Watchman left atrial appendage closure device 08/07/2021   Watchman FLX 27mm with Dr. Cindie   Seasonal allergies    Sleep apnea    does not use CPAP   Wears glasses    Past Surgical History:  Procedure Laterality Date   ABDOMINAL HYSTERECTOMY  1990   ATRIAL FIBRILLATION ABLATION N/A 08/12/2020   Procedure: ATRIAL FIBRILLATION ABLATION;  Surgeon: Cindie Ole DASEN, MD;  Location: [facility];  Service: Cardiovascular;  Laterality: N/A;   ATRIAL FIBRILLATION ABLATION N/A 03/26/2023   Procedure: ATRIAL FIBRILLATION ABLATION;  Surgeon: Cindie Ole DASEN, MD;  Location: [facility];  Service: Cardiovascular;  Laterality: N/A;   BREAST BIOPSY Right    benign   CARDIOVERSION N/A 08/15/2019   Procedure: CARDIOVERSION;  Surgeon: Elmira Newman PARAS, MD;  Location: [facility];  Service: Cardiovascular;  Laterality: N/A;   COLONOSCOPY     LEFT ATRIAL  APPENDAGE OCCLUSION N/A 08/07/2021   Procedure: LEFT ATRIAL APPENDAGE OCCLUSION;  Surgeon: Cindie Ole DASEN, MD;  Location: [facility];  Service: Cardiovascular;  Laterality: N/A;   meniscus tear knee     left knee   NASAL SINUS SURGERY  1975   ORIF ANKLE FRACTURE  2009   left   ORIF HUMERUS FRACTURE Right 07/15/2023   Procedure: OPEN REDUCTION INTERNAL FIXATION (ORIF) DISTAL HUMERUS FRACTURE;  Surgeon: Cristy Bonner DASEN, MD;  Location: [facility];  Service: Orthopedics;  Laterality: Right;   REVERSE SHOULDER ARTHROPLASTY Left 09/02/2023   Procedure: ARTHROPLASTY, SHOULDER, TOTAL, REVERSE;  Surgeon: Cristy Bonner DASEN, MD;  Location: [facility];  Service: Orthopedics;  Laterality: Left;   TEE WITHOUT CARDIOVERSION N/A 08/07/2021   Procedure: TRANSESOPHAGEAL ECHOCARDIOGRAM (TEE);  Surgeon: Cindie Ole DASEN, MD;  Location: [facility];  Service: Cardiovascular;  Laterality: N/A;   TOTAL KNEE ARTHROPLASTY Left 02/03/2019   Procedure: LEFT TOTAL KNEE ARTHROPLASTY;  Surgeon: Vernetta Lonni GRADE, MD;  Location: [facility];  Service: Orthopedics;  Laterality: Left;   TURBINATE REDUCTION Bilateral 06/04/2014   Procedure: BILATERAL TURBINATE REDUCTION;  Surgeon: Daniel Moccasin, MD;  Location: [facility];  Service: ENT;  Laterality: Bilateral;  TYMPANOSTOMY TUBE PLACEMENT     left   Patient Active Problem List   Diagnosis Date Noted   Near syncope 09/11/2023   Impacted cerumen of left ear 08/15/2023   Other specified disorders of eustachian tube, left ear 02/06/2023   Central perforation of tympanic membrane of left ear 02/06/2023   Presence of Watchman left atrial appendage closure device 08/07/2021   Atrial fibrillation (HCC) 08/07/2021   OSA (obstructive sleep apnea) 10/29/2020   Persistent atrial fibrillation (HCC) 03/27/2020   Hypercoagulable state due to persistent atrial fibrillation (HCC) 03/27/2020   PAD (peripheral artery disease) 12/27/2019   Exertional chest pain  12/27/2019   Primary osteoarthritis of right knee 07/12/2019   Paroxysmal atrial fibrillation (HCC) 06/16/2019   Nonrheumatic aortic valve insufficiency 06/16/2019   Nonrheumatic mitral valve regurgitation 06/16/2019   Tachycardia 02/20/2019   Abnormal EKG 02/20/2019   Essential hypertension 02/20/2019   Status post total left knee replacement 02/03/2019   Unilateral primary osteoarthritis, left knee 11/30/2016   Chronic pain of left knee 11/30/2016    PCP:   Loreli Kins, MD    REFERRING PROVIDER: Cristy Bonner DASEN, MD/ Margeret Kotyk, MD  REFERRING DIAG: left total shoulder arthoplasty ORIF tuberosities/ lumbar stenosis, cervical radiculopathy - added 03/03/24  THERAPY DIAG:  Radiculopathy, cervical region  Muscle weakness (generalized)  Cervicalgia  Abnormal posture  Chronic left shoulder pain  Other low back pain  Unsteadiness on feet  Lumbar spondylosis  Rationale for Evaluation and Treatment: Rehabilitation  ONSET DATE: Surgery 09/02/2023  SUBJECTIVE:                                                                                                                                                                                      SUBJECTIVE STATEMENT:  PA at the surgeon's office assessed my Rt elbow and neck and said they think my new pain, weakness and numbness in Rt elbow/arm/hand is coming from a pinched nerve.  They added PT for this and I see a surgeon in about 4 weeks.  POOL ACCESS: GAC - goes to water  aerobics 1-2x/wk  Hand dominance: Right  PERTINENT HISTORY: Anxiety; OA; Depression; HTN; Hx left TKA  PAIN:   PAIN:  Are you having pain? Yes NPRS scale: 8/10 Pain location: Rt elbow, medial forearm, digits 4, 5. Neck. Pain orientation: Right and Medial  PAIN TYPE: sharp and numb, weak Pain description: constant  Aggravating factors: fine motor tasks, sleeping, force through Rt UE like lifting, carrying, pushing Relieving factors:  unsure   PRECAUTIONS: Shoulder and Fall see protocol in media  RED FLAGS:  None   WEIGHT BEARING RESTRICTIONS: No  FALLS:  Has patient fallen  in last 6 months? Yes. Number of falls 2 fall. Patient had two surgeries two weeks from each other due to falling. She fell while she was at the grocery store. She was turning and her husband said her feet did not move with her. PT to address balance   LIVING ENVIRONMENT: Lives with: lives with their spouse Lives in: House/apartment Stairs: Yes: Internal: 12 steps; on left going up Has following equipment at home: Single point cane and Walker - 2 wheeled  OCCUPATION: Retired  PLOF: Independent, Independent with basic ADLs, Independent with household mobility without device, Independent with community mobility without device, Independent with gait, and Independent with transfers  PATIENT GOALS: use of my arm again. To get back to water  aerobics and drive  NEXT MD VISIT:   OBJECTIVE:  Note: Objective measures were completed at Evaluation unless otherwise noted.   PATIENT SURVEYS :  Initial eval QuickDASH Lt : 52.3/100 52.3%  12/15/23: QuickDASH Lt: 34.1/100 34.2%  01/31/24: QuickDASH Lt : 43.2/100 43.2%   12/01/23 ODI: 20/50= 40%  12/15/23 ODI: 21/50= 42%  01/31/24: ODI: 21/50= 42%  02/14/24 ODI 21/50 = 42%  03/03/24: NDI = 21/50, 42% UEFS = 30/80, 37.5%  COGNITION: Overall cognitive status: Within functional limits for tasks assessed     SENSATION: 03/03/24: Pt numb to light touch Rt UE digits 4, 5 and medial aspect of arm  WFL  POSTURE: 03/03/24: Rounded shoulders and forward head, increased cervical lordosis with very short posterior neck posture  Rounded shoulder & forward head Standing posture/ gait: right pelvic shift and left trunk lean  NECK ROM: 03/03/24 Flexion  45 with neck pain - central Extension 50 with neck pain - central Rt SB 20 with pain Lt SB 20 with pain Rt Rot 50 - Rt neck pain Lt Rot 40  - Rt neck pain   UPPER EXTREMITY ROM:   Passive ROM Right eval Left eval Left 11/17/23 Left 1015/25 Left 01/31/24 Left 12/15  Shoulder flexion  90 150 160 160 160  Shoulder extension  NT due to restrictions      Shoulder abduction  75 85 120 125 125  Shoulder adduction        Shoulder internal rotation  NT due to restrictions  70 WNL WNL  Shoulder external rotation  NT due to restrictions 22 60 C7 C7  Elbow flexion        Elbow extension        Wrist flexion        Wrist extension        Wrist ulnar deviation        Wrist radial deviation        Wrist pronation        Wrist supination        (Blank rows = not tested)  11/17/23 seated scaption  5 deg, flex 68 deg  NECK RESISTED ISOMETRICS - WEAK AND PAINFUL  UPPER EXTREMITY MMT:  03/03/24: Rt shoulder: abd 3+/5, ER 3+/5, grip very weak on Rt: Grip Rt: 5lb grip Lt: 15lb  02/14/24: Grip Rt: 5lb grip Lt: 15lb Lt shoulder: 4/5 all muscles except 0/5 ER  NT due to weight restrictions of 1lb  LUMBAR ROM: All WFL  LOWER EXTREMITY MMT: significant proximal hip weakness, quad 4-/5,  DF 4-/5      01/31/24: right hip flexor 4/5   LOWER EXTREMITY ROM:  FUNCTIONAL TESTING Initial eval: 5STS: 17.47 sec no UE support TUG:14.32 sec  12/01/23: 5STS: 19.78 sec no UE  support TUG:12.06 sec SLS: severe trendelenburg but able to hold approx 2-3 sec Tandem stance: needs help to get into position but able to hold 2-3 sec   12/15/23 5STS: 21.96 sec no UE support TUG:16.40 sec with cane  01/31/24 5STS: 21.30 sec with UE support TUG:14.41 sec with cane  JOINT MOBILITY TESTING:  03/03/24 Significant joint restriction throughout c-spine U-joint sideglides, upglides in mid-lower cervical, all glides upper thoracic with more stiffness on Rt than Lt  See PROM measurement above Open end feel  PALPATION: Significant tenderness throughout cervical spine facets and soft tissues, spasm present in Rt upper trap                                                                                                                               TREATMENT DATE:  [facility] Adult PT Treatment:   03/03/24 Assessment of neck and Rt UE (see above) NDI and UEFS (see above) Initiated HEP Seated and supine neck retraction 10x5 holds Seated and supine neck isometrics 5x5 holds Educated Pt about dry needling, traction and other plans for PT to address symptoms before meeting with surgeon   03/04/23 UBE 3x3 L2 PT present to review status Supine bent knee fall out x10 each side with TA indraw Supine hooklying march x20 with TA indraw Supine bridge x10 Supine from hooklying LE dead bug slow controlled legs only x8 Supine 2lb Lt shoulder flexion x6, then drop to 1lb w/o rest and did 15 more reps Supine hooklying bil serratus punch 2lb x20 Supine hooklying hip abd red loop band at knees - x20 Pt education: nerve flossing and technique/progression with ulnar nerve on Rt side Standing Lt green tband shoulder ext x10 and tricep pressdown x10 Ball on wall standing x10 circles each way Lt UE   03/01/24: Pt seen for aquatic therapy today.  Treatment took place in water  3.5-4.75 ft in depth at the [facility] pool. Temp of water  was 91.  Pt entered/exited the pool via stairs independently with rail. Pt requires buoyancy of water  for support and to offload joints with strengthening exercises.  Seated water  bench with 75% submersion Pt performed seated LE AROM exercises 20x in all planes while discussing her status and pain. - 75% depth water  walking 10x in each direction, just natural arms by her side were better for elbow. -high knee marching arms by her side. 6 lengths - single arm holding green bell for first 5 reps then elbow had some pain so we completed with just water  resistane , no bell. 2x10 - 6 min underwater bicycle in corner with yellow noodle in horseback -Front and side step ups to second step 10x Bil holding onto  railings  02/16/24: Pt seen for aquatic therapy today.  Treatment took place in water  3.5-4.75 ft in depth at the [facility] pool. Temp of water  was 91.  Pt entered/exited the pool via stairs independently with rail. Pt requires buoyancy of water  for support  and to offload joints with strengthening exercises.   - 75% depth water  walking 10x in each direction with UE push/pull using rainbow wts. -high knee marching with rainbow floats for balance asst 6 lengths - suitcase carry single yellow hand floats under water  at side, walking backward 2x4 lengths on each side - 1/4 blue hollow noodles Bil hands for triceps extesion 2x10 - single arm holding green bell, shoulder circumductions 10x each direction Bil, then forward/bk 20x -standing with bil green bells forward back 2x10 --core/lat press standing against wall with rainbow floats 2x10 - 5 min underwater bicycle in corner with yellow noodle in horseback  PATIENT EDUCATION: Education details: PT eval findings, anticipated POC, progress with PT, and initial HEP Person educated: Patient Education method: Explanation, Demonstration, and Handouts Education comprehension: verbalized understanding, returned demonstration, and needs further education  HOME EXERCISE PROGRAM: Access Code: [email or web address] URL: [email or web address] Date: 03/03/2024 Prepared by: Orvil Jhett Fretwell  Exercises - Supine Shoulder Flexion with Dowel  - 1 x daily - 7 x weekly - 1-2 sets - 10 reps - Seated Scapular Retraction  - 1 x daily - 7 x weekly - 1 sets - 10 reps - Isometric Shoulder Flexion  - 1 x daily - 7 x weekly - 2 sets - 5 reps - 5 hold - Standing Isometric Shoulder Abduction with Doorway - Arm Bent  - 1 x daily - 7 x weekly - 2 sets - 5 reps - 5 hold - Seated Shoulder Flexion AAROM with Pulley Behind  - 1 x daily - 7 x weekly - 2 sets - 10 reps - Seated Shoulder Scaption AAROM with Pulley at Side  - 1 x daily - 7 x weekly - 2 sets - 10  reps - Standing Hip Abduction with Counter Support  - 1 x daily - 7 x weekly - 2 sets - 10 reps - Standing Hip Extension with Leg Bent and Support  - 1 x daily - 7 x weekly - 2 sets - 10 reps - Clam  - 1 x daily - 7 x weekly - 2 sets - 10 reps - Sidelying Hip Abduction  - 1 x daily - 7 x weekly - 2 sets - 10 reps - Supine Cervical Retraction with Towel  - 1 x daily - 7 x weekly - 2 sets - 5 reps - 5 hold - Supine Isometric Neck Sidebend  - 1 x daily - 7 x weekly - 2 sets - 5 reps - 5 hold - Supine Isometric Neck Rotation  - 1 x daily - 7 x weekly - 2 sets - 5 reps - 5 hold  AQUATIC Access Code: [email or web address] URL: [email or web address] Prepared by: [facility] This aquatic home exercise program from MedBridge utilizes pictures from land based exercises, but has been adapted prior to lamination and issuance.   ASSESSMENT:  CLINICAL IMPRESSION:  Pt continues to benefit from HEP and pool exercise for Lt reverse total shoulder rehab and chonic LBP with weakness of trunk and bil LE.  She has new onset of Rt (dominant hand) weakness and numbness with pain along with stiff and painful neck.  MD performed X-rays and stated she has severe neck arthritis and wanted her to start PT immediately with follow up in 1 month.  We will plan to continue Lt shoulder and back rehab as needed with increased focus on neck and Rt UE treatment.  Pt presents with limited and painful neck ROM, weakness of neck stabilizers, faulty  posture of upper quadrants and neck, weakness of Rt shoulder, wrist and grip, and pain about Rt elbow extending into arm and hand.  She is completely numb in Rt UE digits 4 and 5. Her NDI score is 43% and UEFS score is 37.5%, demonstrating severe limitation of function in neck and dominant UE.  Pt will benefit from skilled PT to address findings and maximize return to prior level of function.   OBJECTIVE IMPAIRMENTS: Abnormal gait, decreased balance,  decreased ROM, decreased strength, hypomobility, increased muscle spasms, impaired flexibility, impaired UE functional use, postural dysfunction, and pain.   ACTIVITY LIMITATIONS: carrying, lifting, bending, squatting, stairs, bathing, dressing, reach over head, and hygiene/grooming, gripping  PARTICIPATION LIMITATIONS: meal prep, cleaning, laundry, interpersonal relationship, driving, shopping, and community activity  PERSONAL FACTORS: Age and 3+ comorbidities: falls risk HTN; anxiety; depression are also affecting patient's functional outcome.   REHAB POTENTIAL: Good  CLINICAL DECISION MAKING: Evolving/moderate complexity  EVALUATION COMPLEXITY: Moderate  GOALS: Goals reviewed with patient? Yes  SHORT TERM GOALS: Target date: 11/22/2023   Patient will be independent with initial HEP. Baseline:  Goal status: MET  2.  Patient will report > or = to 40% use of Lt UE with light home and self care tasks due to improved ROM and strength. Baseline:  Goal status: partially met  3.  Patient will demonstrate correct seated and standing posture to reduce mechanical strain. Baseline:  Goal status: MET 12/15/23  4.  .  Patient will demonstrate > or = to 100 degrees in Lt  shoulder P/ROM for improved reaching. Baseline:  Goal status: MET 150 deg 11/17/23  5.  Patient to report ability to ambulate for at least 5 min without need to sit.  Baseline:  Goal status: MET 12/15/23  6. Patient to obtain proper assistive device to reduce fall risk  Baseline: not using anything  Goals status: MET 12/15/23    LONG TERM GOALS: Target date: 05/01/24  Patient will demonstrate independence in advanced HEP. Baseline:  Goal status: INITIAL  2.  .  Patient will report > or = to 70% of normal use of Rt UE for ADLs due to improved functional strength and A/ROM. Baseline:  Goal status: INITIAL  3.  Patient will be able to return to return water  aerobics program with no difficulty to establish normal  exercise routine Baseline:  Goal status: ONGOING 03/03/24   4.  Patient will be able to wash dishes and fold clothes with < or = to 2/10 left shoulder pain to for improved functional use of extremity. Baseline: washing dishes much improved, folding clothes is painful Goal status:In progress - 03/03/24  5.  Patient will demonstrate > or = to 115 degrees in Rt shoulder A/ROM for improved reaching overhead. Baseline:  Goal status: INITIAL  6.  Patient's Annitta will be 64/100 for improved functional use of Lt UE and decreased disability.  Baseline: 52.3/100 Goal status: ONGOING 03/03/24  7. ODI to improve to 16/50  Baseline: 20/50  Goal status: ONGOING 03/03/24  8. Patient to be able to stand or walk for at least 15 min without rest  Baseline:   Goal status:ONGOING 03/03/24 9. Patient will achieve Rt grip strength to within 5lb of Lt grip strength  Baseline: 5lb Rt, 15lb Lt  Goal status: NEW 03/03/24 10. Pt will improve UEFS score to at least 60% to demo improved functional use of dominant UE (right)  Baseline: 37.5%  Goal status: new 03/03/24 11. Pt will demo improved neck ROM  by at least 10 deg in all planes of motion with min pain to improve reading, driving, cooking.  Baseline: see above on 03/03/24  Goal status: new 03/03/24  PLAN: PT FREQUENCY: 1-2x/week  PT DURATION: 8 weeks  PLANNED INTERVENTIONS: [id number]- PT Re-evaluation, 97110-Therapeutic exercises, 97530- Therapeutic activity, 97112- Neuromuscular re-education, 97535- Self Care, 02859- Manual therapy, [phone]- Gait training, [phone]- Canalith repositioning, [id number]- Aquatic Therapy, [phone]- Electrical stimulation (unattended), [phone]- Electrical stimulation (manual), [id number]- Vasopneumatic device, [id number]- Ultrasound, [id number]- Ionotophoresis 4mg /ml Dexamethasone , 79439 (1-2 muscles), [id number] (3+ muscles)- Dry Needling, Patient/Family education, Balance training, Stair training, Taping, Joint mobilization, Joint manipulation, Spinal manipulation, Spinal  mobilization, Scar mobilization, Vestibular training, Cryotherapy, and Moist heat  PLAN FOR NEXT SESSION:  new addition of cervical and Rt UE: try mechanical traction, DN neck, grip and shoulder strength, postural strength, neck stabilization. For Lt shoulder and trunk/hips: Continue to work on strength, balance, fall prevention, left shoulder ROM and strength and pain control.   Orvil Fester, PT 03/03/2024 12:26 PM    [facility] [address] [place], [place] [address] Phone # [phone] Fax [phone]    "

## 2024-03-07 ENCOUNTER — Ambulatory Visit

## 2024-03-08 ENCOUNTER — Ambulatory Visit: Admitting: Physical Therapy

## 2024-03-10 ENCOUNTER — Ambulatory Visit: Admitting: Physical Therapy

## 2024-03-10 NOTE — Therapy (Incomplete)
 "     OUTPATIENT PHYSICAL THERAPY CERVICAL, UPPER EXTREMITY and LUMBAR TREATMENT    Patient Name: Michelle Johnston MRN: [id number] DOB:[birth year], 74 y.o., female Today's Date: 03/10/2024  END OF SESSION:       Past Medical History:  Diagnosis Date   Anxiety    Arthritis    Atrial fibrillation (HCC)    Bradycardia    Bronchitis    Depression    Dyspnea    GERD (gastroesophageal reflux disease)    Glaucoma    Hypertension    Presence of Watchman left atrial appendage closure device 08/07/2021   Watchman FLX 27mm with Dr. Cindie   Seasonal allergies    Sleep apnea    does not use CPAP   Wears glasses    Past Surgical History:  Procedure Laterality Date   ABDOMINAL HYSTERECTOMY  1990   ATRIAL FIBRILLATION ABLATION N/A 08/12/2020   Procedure: ATRIAL FIBRILLATION ABLATION;  Surgeon: Cindie Ole DASEN, MD;  Location: [facility];  Service: Cardiovascular;  Laterality: N/A;   ATRIAL FIBRILLATION ABLATION N/A 03/26/2023   Procedure: ATRIAL FIBRILLATION ABLATION;  Surgeon: Cindie Ole DASEN, MD;  Location: [facility];  Service: Cardiovascular;  Laterality: N/A;   BREAST BIOPSY Right    benign   CARDIOVERSION N/A 08/15/2019   Procedure: CARDIOVERSION;  Surgeon: Elmira Newman PARAS, MD;  Location: [facility];  Service: Cardiovascular;  Laterality: N/A;   COLONOSCOPY     LEFT ATRIAL APPENDAGE OCCLUSION N/A 08/07/2021   Procedure: LEFT ATRIAL APPENDAGE OCCLUSION;  Surgeon: Cindie Ole DASEN, MD;  Location: [facility];  Service: Cardiovascular;  Laterality: N/A;   meniscus tear knee     left knee   NASAL SINUS SURGERY  1975   ORIF ANKLE FRACTURE  2009   left   ORIF HUMERUS FRACTURE Right 07/15/2023   Procedure: OPEN REDUCTION INTERNAL FIXATION (ORIF) DISTAL HUMERUS FRACTURE;  Surgeon: Cristy Bonner DASEN, MD;  Location: [facility];  Service: Orthopedics;  Laterality: Right;   REVERSE SHOULDER ARTHROPLASTY Left 09/02/2023   Procedure: ARTHROPLASTY,  SHOULDER, TOTAL, REVERSE;  Surgeon: Cristy Bonner DASEN, MD;  Location: [facility];  Service: Orthopedics;  Laterality: Left;   TEE WITHOUT CARDIOVERSION N/A 08/07/2021   Procedure: TRANSESOPHAGEAL ECHOCARDIOGRAM (TEE);  Surgeon: Cindie Ole DASEN, MD;  Location: [facility];  Service: Cardiovascular;  Laterality: N/A;   TOTAL KNEE ARTHROPLASTY Left 02/03/2019   Procedure: LEFT TOTAL KNEE ARTHROPLASTY;  Surgeon: Vernetta Lonni GRADE, MD;  Location: [facility];  Service: Orthopedics;  Laterality: Left;   TURBINATE REDUCTION Bilateral 06/04/2014   Procedure: BILATERAL TURBINATE REDUCTION;  Surgeon: Daniel Moccasin, MD;  Location: [facility];  Service: ENT;  Laterality: Bilateral;   TYMPANOSTOMY TUBE PLACEMENT     left   Patient Active Problem List   Diagnosis Date Noted   Near syncope 09/11/2023   Impacted cerumen of left ear 08/15/2023   Other specified disorders of eustachian tube, left ear 02/06/2023   Central perforation of tympanic membrane of left ear 02/06/2023   Presence of Watchman left atrial appendage closure device 08/07/2021   Atrial fibrillation (HCC) 08/07/2021   OSA (obstructive sleep apnea) 10/29/2020   Persistent atrial fibrillation (HCC) 03/27/2020   Hypercoagulable state due to persistent atrial fibrillation (HCC) 03/27/2020   PAD (peripheral artery disease) 12/27/2019   Exertional chest pain 12/27/2019   Primary osteoarthritis of right knee 07/12/2019   Paroxysmal atrial fibrillation (HCC) 06/16/2019   Nonrheumatic aortic valve insufficiency 06/16/2019  Nonrheumatic mitral valve regurgitation 06/16/2019   Tachycardia 02/20/2019   Abnormal EKG 02/20/2019   Essential hypertension 02/20/2019   Status post total left knee replacement 02/03/2019   Unilateral primary osteoarthritis, left knee 11/30/2016   Chronic pain of left knee 11/30/2016    PCP:   Loreli Kins, MD    REFERRING PROVIDER: Cristy Bonner DASEN, MD/ Margeret Kotyk, MD  REFERRING DIAG: left total  shoulder arthoplasty ORIF tuberosities/ lumbar stenosis, cervical radiculopathy - added 03/03/24  THERAPY DIAG:  No diagnosis found.  Rationale for Evaluation and Treatment: Rehabilitation  ONSET DATE: Surgery 09/02/2023  SUBJECTIVE:                                                                                                                                                                                      SUBJECTIVE STATEMENT:  PA at the surgeon's office assessed my Rt elbow and neck and said they think my new pain, weakness and numbness in Rt elbow/arm/hand is coming from a pinched nerve.  They added PT for this and I see a surgeon in about 4 weeks.  POOL ACCESS: GAC - goes to water  aerobics 1-2x/wk  Hand dominance: Right  PERTINENT HISTORY: Anxiety; OA; Depression; HTN; Hx left TKA  PAIN:   PAIN:  Are you having pain? Yes NPRS scale: 8/10 Pain location: Rt elbow, medial forearm, digits 4, 5. Neck. Pain orientation: Right and Medial  PAIN TYPE: sharp and numb, weak Pain description: constant  Aggravating factors: fine motor tasks, sleeping, force through Rt UE like lifting, carrying, pushing Relieving factors: unsure   PRECAUTIONS: Shoulder and Fall see protocol in media  RED FLAGS:  None   WEIGHT BEARING RESTRICTIONS: No  FALLS:  Has patient fallen in last 6 months? Yes. Number of falls 2 fall. Patient had two surgeries two weeks from each other due to falling. She fell while she was at the grocery store. She was turning and her husband said her feet did not move with her. PT to address balance   LIVING ENVIRONMENT: Lives with: lives with their spouse Lives in: House/apartment Stairs: Yes: Internal: 12 steps; on left going up Has following equipment at home: Single point cane and Walker - 2 wheeled  OCCUPATION: Retired  PLOF: Independent, Independent with basic ADLs, Independent with household mobility without device, Independent with community mobility  without device, Independent with gait, and Independent with transfers  PATIENT GOALS: use of my arm again. To get back to water  aerobics and drive  NEXT MD VISIT:   OBJECTIVE:  Note: Objective measures were completed at Evaluation unless otherwise noted.   PATIENT SURVEYS :  Initial eval QuickDASH Lt : 52.3/100  52.3%  12/15/23: QuickDASH Lt: 34.1/100 34.2%  01/31/24: QuickDASH Lt : 43.2/100 43.2%   12/01/23 ODI: 20/50= 40%  12/15/23 ODI: 21/50= 42%  01/31/24: ODI: 21/50= 42%  02/14/24 ODI 21/50 = 42%  03/03/24: NDI = 21/50, 42% UEFS = 30/80, 37.5%  COGNITION: Overall cognitive status: Within functional limits for tasks assessed     SENSATION: 03/03/24: Pt numb to light touch Rt UE digits 4, 5 and medial aspect of arm  WFL  POSTURE: 03/03/24: Rounded shoulders and forward head, increased cervical lordosis with very short posterior neck posture  Rounded shoulder & forward head Standing posture/ gait: right pelvic shift and left trunk lean  NECK ROM: 03/03/24 Flexion  45 with neck pain - central Extension 50 with neck pain - central Rt SB 20 with pain Lt SB 20 with pain Rt Rot 50 - Rt neck pain Lt Rot 40 - Rt neck pain   UPPER EXTREMITY ROM:   Passive ROM Right eval Left eval Left 11/17/23 Left 1015/25 Left 01/31/24 Left 12/15  Shoulder flexion  90 150 160 160 160  Shoulder extension  NT due to restrictions      Shoulder abduction  75 85 120 125 125  Shoulder adduction        Shoulder internal rotation  NT due to restrictions  70 WNL WNL  Shoulder external rotation  NT due to restrictions 22 60 C7 C7  Elbow flexion        Elbow extension        Wrist flexion        Wrist extension        Wrist ulnar deviation        Wrist radial deviation        Wrist pronation        Wrist supination        (Blank rows = not tested)  11/17/23 seated scaption  5 deg, flex 68 deg  NECK RESISTED ISOMETRICS - WEAK AND PAINFUL  UPPER EXTREMITY MMT:   03/03/24: Rt shoulder: abd 3+/5, ER 3+/5, grip very weak on Rt: Grip Rt: 5lb grip Lt: 15lb  02/14/24: Grip Rt: 5lb grip Lt: 15lb Lt shoulder: 4/5 all muscles except 0/5 ER  NT due to weight restrictions of 1lb  LUMBAR ROM: All WFL  LOWER EXTREMITY MMT: significant proximal hip weakness, quad 4-/5,  DF 4-/5      01/31/24: right hip flexor 4/5   LOWER EXTREMITY ROM:  FUNCTIONAL TESTING Initial eval: 5STS: 17.47 sec no UE support TUG:14.32 sec  12/01/23: 5STS: 19.78 sec no UE support TUG:12.06 sec SLS: severe trendelenburg but able to hold approx 2-3 sec Tandem stance: needs help to get into position but able to hold 2-3 sec   12/15/23 5STS: 21.96 sec no UE support TUG:16.40 sec with cane  01/31/24 5STS: 21.30 sec with UE support TUG:14.41 sec with cane  JOINT MOBILITY TESTING:  03/03/24 Significant joint restriction throughout c-spine U-joint sideglides, upglides in mid-lower cervical, all glides upper thoracic with more stiffness on Rt than Lt  See PROM measurement above Open end feel  PALPATION: Significant tenderness throughout cervical spine facets and soft tissues, spasm present in Rt upper trap  TREATMENT DATE:  [facility] Adult PT Treatment:   03/10/24   03/03/24 Assessment of neck and Rt UE (see above) NDI and UEFS (see above) Initiated HEP Seated and supine neck retraction 10x5 holds Seated and supine neck isometrics 5x5 holds Educated Pt about dry needling, traction and other plans for PT to address symptoms before meeting with surgeon   03/04/23 UBE 3x3 L2 PT present to review status Supine bent knee fall out x10 each side with TA indraw Supine hooklying march x20 with TA indraw Supine bridge x10 Supine from hooklying LE dead bug slow controlled legs only x8 Supine 2lb Lt shoulder flexion x6, then drop to 1lb w/o rest and did 15  more reps Supine hooklying bil serratus punch 2lb x20 Supine hooklying hip abd red loop band at knees - x20 Pt education: nerve flossing and technique/progression with ulnar nerve on Rt side Standing Lt green tband shoulder ext x10 and tricep pressdown x10 Ball on wall standing x10 circles each way Lt UE   03/01/24: Pt seen for aquatic therapy today.  Treatment took place in water  3.5-4.75 ft in depth at the [facility] pool. Temp of water  was 91.  Pt entered/exited the pool via stairs independently with rail. Pt requires buoyancy of water  for support and to offload joints with strengthening exercises.  Seated water  bench with 75% submersion Pt performed seated LE AROM exercises 20x in all planes while discussing her status and pain. - 75% depth water  walking 10x in each direction, just natural arms by her side were better for elbow. -high knee marching arms by her side. 6 lengths - single arm holding green bell for first 5 reps then elbow had some pain so we completed with just water  resistane , no bell. 2x10 - 6 min underwater bicycle in corner with yellow noodle in horseback -Front and side step ups to second step 10x Bil holding onto railings  02/16/24: Pt seen for aquatic therapy today.  Treatment took place in water  3.5-4.75 ft in depth at the [facility] pool. Temp of water  was 91.  Pt entered/exited the pool via stairs independently with rail. Pt requires buoyancy of water  for support and to offload joints with strengthening exercises.   - 75% depth water  walking 10x in each direction with UE push/pull using rainbow wts. -high knee marching with rainbow floats for balance asst 6 lengths - suitcase carry single yellow hand floats under water  at side, walking backward 2x4 lengths on each side - 1/4 blue hollow noodles Bil hands for triceps extesion 2x10 - single arm holding green bell, shoulder circumductions 10x each direction Bil, then forward/bk 20x -standing  with bil green bells forward back 2x10 --core/lat press standing against wall with rainbow floats 2x10 - 5 min underwater bicycle in corner with yellow noodle in horseback  PATIENT EDUCATION: Education details: PT eval findings, anticipated POC, progress with PT, and initial HEP Person educated: Patient Education method: Explanation, Demonstration, and Handouts Education comprehension: verbalized understanding, returned demonstration, and needs further education  HOME EXERCISE PROGRAM: Access Code: [email or web address] URL: [email or web address] Date: 03/03/2024 Prepared by: Orvil Wynne Rozak  Exercises - Supine Shoulder Flexion with Dowel  - 1 x daily - 7 x weekly - 1-2 sets - 10 reps - Seated Scapular Retraction  - 1 x daily - 7 x weekly - 1 sets - 10 reps - Isometric Shoulder Flexion  - 1 x daily - 7 x weekly - 2 sets - 5 reps - 5 hold - Standing Isometric Shoulder Abduction  with Doorway - Arm Bent  - 1 x daily - 7 x weekly - 2 sets - 5 reps - 5 hold - Seated Shoulder Flexion AAROM with Pulley Behind  - 1 x daily - 7 x weekly - 2 sets - 10 reps - Seated Shoulder Scaption AAROM with Pulley at Side  - 1 x daily - 7 x weekly - 2 sets - 10 reps - Standing Hip Abduction with Counter Support  - 1 x daily - 7 x weekly - 2 sets - 10 reps - Standing Hip Extension with Leg Bent and Support  - 1 x daily - 7 x weekly - 2 sets - 10 reps - Clam  - 1 x daily - 7 x weekly - 2 sets - 10 reps - Sidelying Hip Abduction  - 1 x daily - 7 x weekly - 2 sets - 10 reps - Supine Cervical Retraction with Towel  - 1 x daily - 7 x weekly - 2 sets - 5 reps - 5 hold - Supine Isometric Neck Sidebend  - 1 x daily - 7 x weekly - 2 sets - 5 reps - 5 hold - Supine Isometric Neck Rotation  - 1 x daily - 7 x weekly - 2 sets - 5 reps - 5 hold  AQUATIC Access Code: [email or web address] URL: [email or web address] Prepared by: [facility] [address] This aquatic home exercise program from MedBridge  utilizes pictures from land based exercises, but has been adapted prior to lamination and issuance.   ASSESSMENT:  CLINICAL IMPRESSION:  Pt continues to benefit from HEP and pool exercise for Lt reverse total shoulder rehab and chonic LBP with weakness of trunk and bil LE.  She has new onset of Rt (dominant hand) weakness and numbness with pain along with stiff and painful neck.  MD performed X-rays and stated she has severe neck arthritis and wanted her to start PT immediately with follow up in 1 month.  We will plan to continue Lt shoulder and back rehab as needed with increased focus on neck and Rt UE treatment.  Pt presents with limited and painful neck ROM, weakness of neck stabilizers, faulty posture of upper quadrants and neck, weakness of Rt shoulder, wrist and grip, and pain about Rt elbow extending into arm and hand.  She is completely numb in Rt UE digits 4 and 5. Her NDI score is 43% and UEFS score is 37.5%, demonstrating severe limitation of function in neck and dominant UE.  Pt will benefit from skilled PT to address findings and maximize return to prior level of function.   OBJECTIVE IMPAIRMENTS: Abnormal gait, decreased balance, decreased ROM, decreased strength, hypomobility, increased muscle spasms, impaired flexibility, impaired UE functional use, postural dysfunction, and pain.   ACTIVITY LIMITATIONS: carrying, lifting, bending, squatting, stairs, bathing, dressing, reach over head, and hygiene/grooming, gripping  PARTICIPATION LIMITATIONS: meal prep, cleaning, laundry, interpersonal relationship, driving, shopping, and community activity  PERSONAL FACTORS: Age and 3+ comorbidities: falls risk HTN; anxiety; depression are also affecting patient's functional outcome.   REHAB POTENTIAL: Good  CLINICAL DECISION MAKING: Evolving/moderate complexity  EVALUATION COMPLEXITY: Moderate  GOALS: Goals reviewed with patient? Yes  SHORT TERM GOALS: Target date: 11/22/2023   Patient  will be independent with initial HEP. Baseline:  Goal status: MET  2.  Patient will report > or = to 40% use of Lt UE with light home and self care tasks due to improved ROM and strength. Baseline:  Goal status: partially met  3.  Patient will demonstrate correct seated and standing posture to reduce mechanical strain. Baseline:  Goal status: MET 12/15/23  4.  .  Patient will demonstrate > or = to 100 degrees in Lt  shoulder P/ROM for improved reaching. Baseline:  Goal status: MET 150 deg 11/17/23  5.  Patient to report ability to ambulate for at least 5 min without need to sit.  Baseline:  Goal status: MET 12/15/23  6. Patient to obtain proper assistive device to reduce fall risk  Baseline: not using anything  Goals status: MET 12/15/23    LONG TERM GOALS: Target date: 05/01/24  Patient will demonstrate independence in advanced HEP. Baseline:  Goal status: INITIAL  2.  .  Patient will report > or = to 70% of normal use of Rt UE for ADLs due to improved functional strength and A/ROM. Baseline:  Goal status: INITIAL  3.  Patient will be able to return to return water  aerobics program with no difficulty to establish normal exercise routine Baseline:  Goal status: ONGOING 03/03/24   4.  Patient will be able to wash dishes and fold clothes with < or = to 2/10 left shoulder pain to for improved functional use of extremity. Baseline: washing dishes much improved, folding clothes is painful Goal status:In progress - 03/03/24  5.  Patient will demonstrate > or = to 115 degrees in Rt shoulder A/ROM for improved reaching overhead. Baseline:  Goal status: INITIAL  6.  Patient's Annitta will be 64/100 for improved functional use of Lt UE and decreased disability.  Baseline: 52.3/100 Goal status: ONGOING 03/03/24  7. ODI to improve to 16/50  Baseline: 20/50  Goal status: ONGOING 03/03/24  8. Patient to be able to stand or walk for at least 15 min without rest  Baseline:   Goal  status:ONGOING 03/03/24 9. Patient will achieve Rt grip strength to within 5lb of Lt grip strength  Baseline: 5lb Rt, 15lb Lt  Goal status: NEW 03/03/24 10. Pt will improve UEFS score to at least 60% to demo improved functional use of dominant UE (right)  Baseline: 37.5%  Goal status: new 03/03/24 11. Pt will demo improved neck ROM by at least 10 deg in all planes of motion with min pain to improve reading, driving, cooking.  Baseline: see above on 03/03/24  Goal status: new 03/03/24  PLAN: PT FREQUENCY: 1-2x/week  PT DURATION: 8 weeks  PLANNED INTERVENTIONS: [id number]- PT Re-evaluation, 97110-Therapeutic exercises, 97530- Therapeutic activity, 97112- Neuromuscular re-education, 97535- Self Care, [id number]- Manual therapy, [phone]- Gait training, [phone]- Canalith repositioning, [id number]- Aquatic Therapy, [phone]- Electrical stimulation (unattended), [phone]- Electrical stimulation (manual), [id number]- Vasopneumatic device, [id number]- Ultrasound, [id number]- Ionotophoresis 4mg /ml Dexamethasone , 79439 (1-2 muscles), [id number] (3+ muscles)- Dry Needling, Patient/Family education, Balance training, Stair training, Taping, Joint mobilization, Joint manipulation, Spinal manipulation, Spinal mobilization, Scar mobilization, Vestibular training, Cryotherapy, and Moist heat  PLAN FOR NEXT SESSION:  new addition of cervical and Rt UE: try mechanical traction, DN neck, grip and shoulder strength, postural strength, neck stabilization. For Lt shoulder and trunk/hips: Continue to work on strength, balance, fall prevention, left shoulder ROM and strength and pain control.   Orvil Fester, PT 03/10/2024 7:51 AM    [facility] [address] [place], [place] [address] Phone # [phone] Fax [phone]    "

## 2024-03-12 NOTE — Progress Notes (Unsigned)
 " Cardiology Office Note:    Date:  03/14/2024   ID:  CAMORA TREMAIN, DOB October 01, 1950, MRN [id number]  PCP:  Loreli Kins, MD  Cardiologist:  Lonni LITTIE Nanas, MD  Electrophysiologist:  OLE ONEIDA HOLTS, MD (Inactive)   Referring MD: Loreli Kins, MD   Chief Complaint  Patient presents with   Atrial Fibrillation    History of Present Illness:    ELISHIA KACZOROWSKI is a 74 y.o. female with a hx of paroxysmal atrial fibrillation, MR, AI, tobacco use, OSA who presents for follow-up.  She was referred by Dr. Loreli for evaluation of atrial fibrillation, initially seen 03/08/2020.  Previously followed with Dr. Elmira.  She was started on flecainide  for A. fib on 12/8, subsequently developed dizziness and blurry vision along with left upper arm numbness.  She was sent to the ED for concern for TIA/CVA.  MRI negative for stroke.  Neurology was consulted, thought that symptoms likely are due to adverse reaction to flecainide  as opposed to TIA.  She had successful cardioversion on 08/15/2019 but did not sustain sinus rhythm, was back in A. fib at follow-up visit on 08/24/2019.  Echocardiogram on 03/12/2019 showed EF 55 to 60%, mild LVH, grade 1 diastolic dysfunction, moderate AI, moderate MR.  Lexiscan  Myoview on 08/07/2019 showed normal perfusion, EF 43%.  ABIs on 01/02/2020 were mildly reduced in RLE (0.92) and normal in LLE (1.0). repeat echocardiogram 02/06/2020 showed EF 50 to 55%, moderate AI, moderate MR, moderate TR, severe left atrial dilatation, mild right atrial dilatation.  Echocardiogram 06/24/2020 showed EF 55 to 60%, normal RV function, moderate left atrial dilatation, severe right atrial dilatation, mild MR, mild AI, mild dilatation of ascending aorta measuring 39 mm.  Underwent Afib ablation with Dr. Holts on 08/12/2020.  She was tolerating Xarelto , but having significant issues with arthritis and unable to take NSAIDs due to Xarelto .  She was referred to Dr. Holts and underwent Watchman  left atrial appendage occlusion on 08/07/2021.  Xarelto  was discontinued.  Cardiac MRI on 07/17/2021 showed normal biventricular function, no LGE, mild to moderate AI (regurgitant fraction 14%), mild mitral annular disjunction with trivial MR, dilated ascending aorta measuring 41 mm, dilated main pulmonary artery measuring 31 mm.  Zio patch x 7 days 08/2022 showed 100% A-fib burden with average rate 94 bpm.  Echocardiogram 11/2022 showed EF 55 to 60%, normal RV function, severe left atrial enlargement, moderate to severe AI.  Coronary CTA 01/2023 showed nonobstructive CAD, less than 25% stenosis in proximal RCA and proximal ramus, calcium  score 1 (29th percentile), dilated ascending aorta measuring 40 mm.  Underwent redo ablation 03/2023; noted to have multiple atrial flutter circuits with large scar burden in left atrium, recommended medical therapy for current arrhythmia.  Cardiac MRI 10/2023 showed moderate aortic regurgitation (regurgitant fraction 18%), LVEF 56%, RVEF 54%.  Since last clinic visit, she reports she is doing okay.  Had a difficult year.  She tripped over extension cord in April and suffered fracture in right arm and left shoulder.  Underwent shoulder replacement surgery and was admitted with near syncope in July.  Thought to be dehydrated.  Since that time she reports she is doing better.  Does report some heaviness in her chest that has improved recently.  Reports dyspnea with walking up stairs.  Having lower extremity edema.  Denies any palpitations.  She exercises by doing physical therapy and going to water  aerobics.   BP Readings from Last 3 Encounters:  03/14/24 (!) 140/64  09/15/23 ROLLEN)  160/49  09/11/23 138/74     Past Medical History:  Diagnosis Date   Anxiety    Arthritis    Atrial fibrillation (HCC)    Bradycardia    Bronchitis    Depression    Dyspnea    GERD (gastroesophageal reflux disease)    Glaucoma    Hypertension    Presence of Watchman left atrial appendage  closure device 08/07/2021   Watchman FLX 27mm with Dr. Cindie   Seasonal allergies    Sleep apnea    does not use CPAP   Wears glasses     Past Surgical History:  Procedure Laterality Date   ABDOMINAL HYSTERECTOMY  1990   ATRIAL FIBRILLATION ABLATION N/A 08/12/2020   Procedure: ATRIAL FIBRILLATION ABLATION;  Surgeon: Cindie Ole DASEN, MD;  Location: [facility];  Service: Cardiovascular;  Laterality: N/A;   ATRIAL FIBRILLATION ABLATION N/A 03/26/2023   Procedure: ATRIAL FIBRILLATION ABLATION;  Surgeon: Cindie Ole DASEN, MD;  Location: [facility];  Service: Cardiovascular;  Laterality: N/A;   BREAST BIOPSY Right    benign   CARDIOVERSION N/A 08/15/2019   Procedure: CARDIOVERSION;  Surgeon: Elmira Newman PARAS, MD;  Location: [facility];  Service: Cardiovascular;  Laterality: N/A;   COLONOSCOPY     LEFT ATRIAL APPENDAGE OCCLUSION N/A 08/07/2021   Procedure: LEFT ATRIAL APPENDAGE OCCLUSION;  Surgeon: Cindie Ole DASEN, MD;  Location: [facility];  Service: Cardiovascular;  Laterality: N/A;   meniscus tear knee     left knee   NASAL SINUS SURGERY  1975   ORIF ANKLE FRACTURE  2009   left   ORIF HUMERUS FRACTURE Right 07/15/2023   Procedure: OPEN REDUCTION INTERNAL FIXATION (ORIF) DISTAL HUMERUS FRACTURE;  Surgeon: Cristy Bonner DASEN, MD;  Location: [facility];  Service: Orthopedics;  Laterality: Right;   REVERSE SHOULDER ARTHROPLASTY Left 09/02/2023   Procedure: ARTHROPLASTY, SHOULDER, TOTAL, REVERSE;  Surgeon: Cristy Bonner DASEN, MD;  Location: [facility];  Service: Orthopedics;  Laterality: Left;   TEE WITHOUT CARDIOVERSION N/A 08/07/2021   Procedure: TRANSESOPHAGEAL ECHOCARDIOGRAM (TEE);  Surgeon: Cindie Ole DASEN, MD;  [facility];  Service: Cardiovascular;  Laterality: N/A;   TOTAL KNEE ARTHROPLASTY Left 02/03/2019   Procedure: LEFT TOTAL KNEE ARTHROPLASTY;  Surgeon: Vernetta Lonni GRADE, MD;  Location: [facility];  Service: Orthopedics;   Laterality: Left;   TURBINATE REDUCTION Bilateral 06/04/2014   Procedure: BILATERAL TURBINATE REDUCTION;  Surgeon: Daniel Moccasin, MD;  Location: [facility];  Service: ENT;  Laterality: Bilateral;   TYMPANOSTOMY TUBE PLACEMENT     left    Current Medications: Current Meds  Medication Sig   acetaminophen  (TYLENOL ) 500 MG tablet Take 1,000 mg by mouth 3 (three) times daily as needed for moderate pain (pain score 4-6) or mild pain (pain score 1-3).   amLODipine  (NORVASC ) 10 MG tablet Take 10 mg by mouth at bedtime.   aspirin  EC 81 MG tablet Take 1 tablet (81 mg total) by mouth daily. Swallow whole. (Patient taking differently: Take 81 mg by mouth at bedtime. Swallow whole.)   b complex vitamins tablet Take 1 tablet by mouth daily.   bisacodyl  (DULCOLAX) 10 MG suppository Place 1 suppository (10 mg total) rectally daily as needed for moderate constipation.   calcium  carbonate (OS-CAL - DOSED IN MG OF ELEMENTAL CALCIUM ) 1250 (500 Ca) MG tablet Take 1 tablet by mouth daily with breakfast.   carvedilol  (COREG ) 6.25 MG tablet TAKE 1 TABLET BY MOUTH TWICE DAILY  WITH A MEAL   cetirizine (ZYRTEC) 10 MG tablet Take 10 mg by mouth at bedtime.   Cholecalciferol  (VITAMIN D ) 50 MCG (2000 UT) tablet Take 2,000 Units by mouth daily.   cyanocobalamin  (VITAMIN B12) 1000 MCG/ML injection Inject 1,000 mcg into the muscle once a week.   feeding supplement (ENSURE PLUS HIGH PROTEIN) LIQD Take 237 mLs by mouth 2 (two) times daily between meals.   FLUoxetine  (PROZAC ) 40 MG capsule Take 40 mg by mouth daily.   fluticasone  (FLONASE ) 50 MCG/ACT nasal spray Place 1 spray into both nostrils daily.   gabapentin  (NEURONTIN ) 300 MG capsule Take 300-600 mg by mouth See admin instructions. Take 300 mg in the morning and 600 mg at night   ipratropium (ATROVENT) 0.06 % nasal spray Place 1 spray into both nostrils daily as needed for rhinitis.   latanoprost  (XALATAN ) 0.005 % ophthalmic solution Place 1 drop into both eyes  every morning.    meloxicam  (MOBIC ) 15 MG tablet Take 15 mg by mouth daily as needed for pain.   methylphenidate  (RITALIN ) 10 MG tablet Take 5 mg by mouth daily.   montelukast  (SINGULAIR ) 10 MG tablet Take 10 mg by mouth daily as needed (allergies).   Multiple Vitamins-Minerals (MULTIVITAMIN WITH MINERALS) tablet Take 1 tablet by mouth daily.   nitroGLYCERIN  (NITROSTAT ) 0.4 MG SL tablet Place 0.4 mg under the tongue every 5 (five) minutes as needed for chest pain.   Omega-3 Fatty Acids (FISH OIL) 1000 MG CAPS Take 1,000 mg by mouth daily.   omeprazole (PRILOSEC) 20 MG capsule Take 20 mg by mouth daily.   ondansetron  (ZOFRAN ) 4 MG tablet Take 1 tablet (4 mg total) by mouth every 6 (six) hours as needed for nausea.   oxycodone  (OXY-IR) 5 MG capsule Take 5 mg by mouth every 6 (six) hours as needed for pain.   polyethylene glycol (MIRALAX  / GLYCOLAX ) 17 g packet Take 17 g by mouth daily as needed for mild constipation.   rosuvastatin  (CRESTOR ) 20 MG tablet Take 1 tablet by mouth once daily   traMADol (ULTRAM) 50 MG tablet Take 50 mg by mouth every 6 (six) hours as needed.   vitamin C  (ASCORBIC ACID ) 500 MG tablet Take 500 mg by mouth daily.     Allergies:   Dexlansoprazole, Flecainide , Nasal spray, Zetia  [ezetimibe ], Cefaclor, Ciprofibrate, Ciprofloxacin , and Metronidazole    Social History   Socioeconomic History   Marital status: Married    Spouse name: Not on file   Number of children: 0   Years of education: Not on file   Highest education level: Not on file  Occupational History   Not on file  Tobacco Use   Smoking status: Former    Current packs/day: 0.00    Average packs/day: 2.0 packs/day for 30.0 years (60.0 ttl pk-yrs)    Types: Cigarettes    Start date: 05/27/1968    Quit date: 05/28/1998    Years since quitting: 25.8   Smokeless tobacco: Never  Vaping Use   Vaping status: Never Used  Substance and Sexual Activity   Alcohol use: Yes    Alcohol/week: 3.0 - 4.0 standard  drinks of alcohol    Types: 2 Glasses of wine, 1 - 2 Cans of beer per week    Comment: daily-wine daily-1 glass   Drug use: No   Sexual activity: Not Currently    Comment: hyst  Other Topics Concern   Not on file  Social History Narrative   Not on file   Social  Drivers of Health   Tobacco Use: Medium Risk (03/03/2024)   Patient History    Smoking Tobacco Use: Former    Smokeless Tobacco Use: Never    Passive Exposure: Not on Actuary Strain: Not on file  Food Insecurity: No Food Insecurity (09/11/2023)   Epic    Worried About Programme Researcher, Broadcasting/film/video in the Last Year: Never true    Ran Out of Food in the Last Year: Never true  Transportation Needs: No Transportation Needs (09/11/2023)   Epic    Lack of Transportation (Medical): No    Lack of Transportation (Non-Medical): No  Physical Activity: Not on file  Stress: Not on file  Social Connections: Socially Integrated (09/11/2023)   Social Connection and Isolation Panel    Frequency of Communication with Friends and Family: More than three times a week    Frequency of Social Gatherings with Friends and Family: Twice a week    Attends Religious Services: 1 to 4 times per year    Active Member of [employer] or Organizations: Yes    Attends Engineer, Structural: More than 4 times per year    Marital Status: Married  Depression (EYV7-0): Not on file  Alcohol Screen: Not on file  Housing: High Risk (09/11/2023)   Epic    Unable to Pay for Housing in the Last Year: Yes    Number of Times Moved in the Last Year: 0    Homeless in the Last Year: No  Utilities: Not At Risk (09/11/2023)   Epic    Threatened with loss of utilities: No  Health Literacy: Not on file     Family History: The patient's family history includes Hyperlipidemia in her mother; Hypertension in her mother; Lung cancer in her father; Lymphoma in her mother.  ROS:   Please see the history of present illness.     All other systems reviewed and are  negative.  EKGs/Labs/Other Studies Reviewed:    The following studies were reviewed today:  Echo 04/22: IMPRESSIONS    1. Left ventricular ejection fraction, by estimation, is 55 to 60%. The  left ventricle has normal function. The left ventricle has no regional  wall motion abnormalities. There is mild left ventricular hypertrophy.  Left ventricular diastolic parameters  are indeterminate.   2. Right ventricular systolic function is normal. The right ventricular  size is normal. There is normal pulmonary artery systolic pressure. The  estimated right ventricular systolic pressure is 27.6 mmHg.   3. Left atrial size was moderately dilated.   4. Right atrial size was severely dilated.   5. The mitral valve is normal in structure. Mild mitral valve  regurgitation.   6. The aortic valve is tricuspid. Aortic valve regurgitation is mild. No  aortic stenosis is present.   7. Aortic dilatation noted. There is mild dilatation of the ascending  aorta, measuring 39 mm.   8. The inferior vena cava is normal in size with greater than 50%  respiratory variability, suggesting right atrial pressure of 3 mmHg.   EKG:   03/14/2024: Sinus rhythm, first-degree AV block, rate 65, incomplete right bundle branch block, poor R wave progression 07/21/22: Accelerated junctional rhythm, rate 94, incomplete right bundle branch block, poor R wave progression 01/08/22: sinus bradycardia, rate 55, first degree AV block, LAD, iRBBB, poor R wave progression 07/01/21: NSR, rate 55 01/22: atrial fibrillation, rate 85, incomplete right bundle branch block  Recent Labs: 09/11/2023: B Natriuretic Peptide 262.3 09/12/2023: TSH 2.151 09/15/2023:  ALT 24; BUN 6; Creatinine, Ser 0.33; Hemoglobin 10.5; Magnesium  1.8; Platelets 418; Potassium 3.6; Sodium 133  Recent Lipid Panel    Component Value Date/Time   CHOL 179 01/08/2022 1514   TRIG 63 01/08/2022 1514   HDL 90 01/08/2022 1514   CHOLHDL 2.0 01/08/2022 1514   LDLCALC  77 01/08/2022 1514    Physical Exam:    VS:  BP (!) 140/64 (BP Location: Right Arm, Patient Position: Sitting, Cuff Size: Normal)   Pulse 63   Ht 5' 5 (1.651 m)   Wt 147 lb (66.7 kg)   SpO2 98%   BMI 24.46 kg/m     Wt Readings from Last 3 Encounters:  03/14/24 147 lb (66.7 kg)  09/11/23 147 lb 11.3 oz (67 kg)  09/10/23 149 lb (67.6 kg)     GEN:  in no acute distress HEENT: Normal NECK: No JVD; No carotid bruits LYMPHATICS: No lymphadenopathy CARDIAC: Normal rate, RRR, no murmurs RESPIRATORY:  Clear to auscultation without rales, wheezing or rhonchi  ABDOMEN: Soft, non-tender, non-distended MUSCULOSKELETAL:  No edema; No deformity  SKIN: Warm and dry NEUROLOGIC:  Alert and oriented x 3 PSYCHIATRIC:  Normal affect  ASSESSMENT:    1. Aortic valve insufficiency, etiology of cardiac valve disease unspecified   2. Aortic dilatation   3. Essential hypertension   4. Persistent atrial fibrillation (HCC)   5. Preop examination   6. PAD (peripheral artery disease)   7. Hyperlipidemia, unspecified hyperlipidemia type   8. OSA (obstructive sleep apnea)      PLAN:    Preop evaluation: Prior to knee replacement.  She does report occasional chest pain/dyspnea on exertion but coronary CTA 01/2023 showed nonobstructive CAD.  No further cardiac workup recommended prior to surgery.  CAD: Reported atypical chest pain.  Coronary CTA 01/2023 showed nonobstructive CAD, less than 25% stenosis in proximal RCA and proximal ramus, calcium  score 1 (29th percentile), dilated ascending aorta measuring 40 mm.   - Continue rosuvastatin   Atrial fibrillation: Persistent. CHA2DS2-VASc score 3 (hypertension, age, female).  Successful cardioversion in June 2021 but went back into A. fib.  Was tried on flecainide  but had adverse reaction.  Underwent ablation with Dr. Cindie on 08/12/2020.  Appears to be maintaining sinus rhythm.  She was tolerating Xarelto , but having significant issues with arthritis  and unable to take NSAIDs due to Xarelto .  She was referred to Dr. Cindie and underwent Watchman left atrial appendage occlusion on 08/07/2021.  Xarelto  was discontinued.  Underwent redo ablation 03/2023; noted to have multiple atrial flutter circuits with large scar burden in left atrium, recommended medical therapy for current arrhythmia. -Continue carvedilol  6.25 mg twice daily  Mitral regurgitation: Moderate by report on echo 02/06/2020, repeat echo on 06/24/2020 showed mild MR.  Cardiac MRI on 07/17/2021 showed mild mitral annular disjunction with trivial MR  Aortic regurgitation: Moderate by report on echo 02/06/2020 repeat echo on 06/24/2020 showed mild AI.  Echo 06/2021 concerning for moderate to severe AI.  Moderate on TEE 07/2021.  Cardiac MRI on 07/17/2021 showed mild to moderate AI (regurgitant fraction 14%).  Cardiac MRI 10/2023 showed moderate aortic regurgitation (regurgitant fraction 18%), LVEF 56%, RVEF 54%. - Plan echocardiogram in 6 months to monitor  Hypertension: Continue amlodipine  10 mg daily and carvedilol  6.25 mg twice daily.   PAD: Mild PAD RLE (ABI 0.92), normal in LLE (ABI 1.0) 01/2020.  ABIs 01/2022 were normal -Continue statin, ASA.  She is reporting exertional leg pain, will update ABIs  Hyperlipidemia: On rosuvastatin   10 mg daily, LDL 98 on 12/27/2020.  Goal LDL less than 70 given her PAD, increased rosuvastatin  to 20mg  daily but did not toelrate due to myalgais.  LDL 77 on 01/08/2022.  Zetia  10 mg added but unable to tolerate.  Mild nonobstructive CAD on coronary CTA 01/2023 as above,  OSA: Started on CPAP but has been unable to tolerate mask.  BMI 26.  Referred to Dr. Carlie in ENT to evaluate for Inspire, but was told she was not a candidate due to OSA not being severe enough.   - Referred to sleep medicine  Aortic dilatation: Measured 40 mm on CT chest 09/2021.  Measured 40 mm on coronary CTA 01/2023.  Measured 41mm on echo 08/2023.  Will monitor with repeat echocardiogram in 1  year  RTC in 6 months   Medication Adjustments/Labs and Tests Ordered: Current medicines are reviewed at length with the patient today.  Concerns regarding medicines are outlined above.  Orders Placed This Encounter  Procedures   Basic metabolic panel with GFR   Magnesium    Ambulatory referral to Pulmonology   EKG 12-Lead   ECHOCARDIOGRAM COMPLETE   VAS US  ABI WITH/WO TBI    No orders of the defined types were placed in this encounter.    Patient Instructions  Medication Instructions:  Your physician recommends that you continue on your current medications as directed. Please refer to the Current Medication list given to you today.   *If you need a refill on your cardiac medications before your next appointment, please call your pharmacy*  Lab Work: BMET/MAGNESIUM  TODAY   If you have labs (blood work) drawn today and your tests are completely normal, you will receive your results only by: MyChart Message (if you have MyChart) OR A paper copy in the mail If you have any lab test that is abnormal or we need to change your treatment, we will call you to review the results.  Testing/Procedures: Your physician has requested that you have an ankle brachial index (ABI). During this test an ultrasound and blood pressure cuff are used to evaluate the arteries that supply the arms and legs with blood. Allow thirty minutes for this exam. There are no restrictions or special instructions.  Please note: We ask at that you not bring children with you during ultrasound (echo/ vascular) testing. Due to room size and safety concerns, children are not allowed in the ultrasound rooms during exams. Our front office staff cannot provide observation of children in our lobby area while testing is being conducted. An adult accompanying a patient to their appointment will only be allowed in the ultrasound room at the discretion of the ultrasound technician under special circumstances. We apologize for  any inconvenience.  Your physician has requested that you have an echocardiogram. Echocardiography is a painless test that uses sound waves to create images of your heart. It provides your doctor with information about the size and shape of your heart and how well your hearts chambers and valves are working. This procedure takes approximately one hour. There are no restrictions for this procedure. Please do NOT wear cologne, perfume, aftershave, or lotions (deodorant is allowed). Please arrive 15 minutes prior to your appointment time.  Please note: We ask at that you not bring children with you during ultrasound (echo/ vascular) testing. Due to room size and safety concerns, children are not allowed in the ultrasound rooms during exams. Our front office staff cannot provide observation of children in our lobby area while testing is  being conducted. An adult accompanying a patient to their appointment will only be allowed in the ultrasound room at the discretion of the ultrasound technician under special circumstances. We apologize for any inconvenience. TO BE DONE IN 6 MONTHS   Follow-Up: At [facility], you and your health needs are our priority.  As part of our continuing mission to provide you with exceptional heart care, our providers are all part of one team.  This team includes your primary Cardiologist (physician) and Advanced Practice Providers or APPs (Physician Assistants and Nurse Practitioners) who all work together to provide you with the care you need, when you need it.  Your next appointment:   AFTER ECHO IN 6 MONTHS WITH EITHER DR [facility] OR APP   We recommend signing up for the patient portal called MyChart.  Sign up information is provided on this After Visit Summary.  MyChart is used to connect with patients for Virtual Visits (Telemedicine).  Patients are able to view lab/test results, encounter notes, upcoming appointments, etc.  Non-urgent messages can be sent to  your provider as well.   To learn more about what you can do with MyChart, go to [email or web address].   Other Instructions You have been referred to [facility] PULMONARY  IF YOU DO NOT HEAR FROM THEM IN 2 WEEKS YOU CAN CALL THEM DIRECTLY AT HIGHLIGHTED NUMBER               Signed, Lonni LITTIE Nanas, MD  03/14/2024 10:47 AM     [facility] "

## 2024-03-14 ENCOUNTER — Ambulatory Visit: Attending: Cardiology | Admitting: Cardiology

## 2024-03-14 VITALS — BP 140/64 | HR 63 | Ht 65.0 in | Wt 147.0 lb

## 2024-03-14 DIAGNOSIS — I4819 Other persistent atrial fibrillation: Secondary | ICD-10-CM

## 2024-03-14 DIAGNOSIS — Z01818 Encounter for other preprocedural examination: Secondary | ICD-10-CM | POA: Diagnosis not present

## 2024-03-14 DIAGNOSIS — I77819 Aortic ectasia, unspecified site: Secondary | ICD-10-CM

## 2024-03-14 DIAGNOSIS — I351 Nonrheumatic aortic (valve) insufficiency: Secondary | ICD-10-CM | POA: Diagnosis not present

## 2024-03-14 DIAGNOSIS — I10 Essential (primary) hypertension: Secondary | ICD-10-CM | POA: Diagnosis not present

## 2024-03-14 DIAGNOSIS — E785 Hyperlipidemia, unspecified: Secondary | ICD-10-CM | POA: Diagnosis not present

## 2024-03-14 DIAGNOSIS — G4733 Obstructive sleep apnea (adult) (pediatric): Secondary | ICD-10-CM

## 2024-03-14 DIAGNOSIS — I739 Peripheral vascular disease, unspecified: Secondary | ICD-10-CM | POA: Diagnosis not present

## 2024-03-14 LAB — BASIC METABOLIC PANEL WITH GFR
BUN/Creatinine Ratio: 28 (ref 12–28)
BUN: 17 mg/dL (ref 8–27)
CO2: 22 mmol/L (ref 20–29)
Calcium: 9.3 mg/dL (ref 8.7–10.3)
Chloride: 95 mmol/L — ABNORMAL LOW (ref 96–106)
Creatinine, Ser: 0.6 mg/dL (ref 0.57–1.00)
Glucose: 99 mg/dL (ref 70–99)
Potassium: 4.6 mmol/L (ref 3.5–5.2)
Sodium: 131 mmol/L — ABNORMAL LOW (ref 134–144)
eGFR: 94 mL/min/1.73

## 2024-03-14 LAB — MAGNESIUM: Magnesium: 1.9 mg/dL (ref 1.6–2.3)

## 2024-03-14 NOTE — Patient Instructions (Addendum)
 Medication Instructions:  Your physician recommends that you continue on your current medications as directed. Please refer to the Current Medication list given to you today.   *If you need a refill on your cardiac medications before your next appointment, please call your pharmacy*  Lab Work: BMET/MAGNESIUM  TODAY   If you have labs (blood work) drawn today and your tests are completely normal, you will receive your results only by: MyChart Message (if you have MyChart) OR A paper copy in the mail If you have any lab test that is abnormal or we need to change your treatment, we will call you to review the results.  Testing/Procedures: Your physician has requested that you have an ankle brachial index (ABI). During this test an ultrasound and blood pressure cuff are used to evaluate the arteries that supply the arms and legs with blood. Allow thirty minutes for this exam. There are no restrictions or special instructions.  Please note: We ask at that you not bring children with you during ultrasound (echo/ vascular) testing. Due to room size and safety concerns, children are not allowed in the ultrasound rooms during exams. Our front office staff cannot provide observation of children in our lobby area while testing is being conducted. An adult accompanying a patient to their appointment will only be allowed in the ultrasound room at the discretion of the ultrasound technician under special circumstances. We apologize for any inconvenience.  Your physician has requested that you have an echocardiogram. Echocardiography is a painless test that uses sound waves to create images of your heart. It provides your doctor with information about the size and shape of your heart and how well your hearts chambers and valves are working. This procedure takes approximately one hour. There are no restrictions for this procedure. Please do NOT wear cologne, perfume, aftershave, or lotions (deodorant is  allowed). Please arrive 15 minutes prior to your appointment time.  Please note: We ask at that you not bring children with you during ultrasound (echo/ vascular) testing. Due to room size and safety concerns, children are not allowed in the ultrasound rooms during exams. Our front office staff cannot provide observation of children in our lobby area while testing is being conducted. An adult accompanying a patient to their appointment will only be allowed in the ultrasound room at the discretion of the ultrasound technician under special circumstances. We apologize for any inconvenience. TO BE DONE IN 6 MONTHS   Follow-Up: At [facility], you and your health needs are our priority.  As part of our continuing mission to provide you with exceptional heart care, our providers are all part of one team.  This team includes your primary Cardiologist (physician) and Advanced Practice Providers or APPs (Physician Assistants and Nurse Practitioners) who all work together to provide you with the care you need, when you need it.  Your next appointment:   AFTER ECHO IN 6 MONTHS WITH EITHER DR [facility] OR APP   We recommend signing up for the patient portal called MyChart.  Sign up information is provided on this After Visit Summary.  MyChart is used to connect with patients for Virtual Visits (Telemedicine).  Patients are able to view lab/test results, encounter notes, upcoming appointments, etc.  Non-urgent messages can be sent to your provider as well.   To learn more about what you can do with MyChart, go to [email or web address].   Other Instructions You have been referred to [facility]  IF YOU DO NOT HEAR FROM THEM  IN 2 WEEKS YOU CAN CALL THEM DIRECTLY AT HIGHLIGHTED NUMBER

## 2024-03-15 ENCOUNTER — Encounter: Payer: Self-pay | Admitting: Physical Therapy

## 2024-03-15 ENCOUNTER — Ambulatory Visit: Payer: Self-pay | Admitting: Cardiology

## 2024-03-15 ENCOUNTER — Ambulatory Visit: Admitting: Physical Therapy

## 2024-03-15 DIAGNOSIS — R2681 Unsteadiness on feet: Secondary | ICD-10-CM

## 2024-03-15 DIAGNOSIS — M47816 Spondylosis without myelopathy or radiculopathy, lumbar region: Secondary | ICD-10-CM

## 2024-03-15 DIAGNOSIS — M5412 Radiculopathy, cervical region: Secondary | ICD-10-CM

## 2024-03-15 DIAGNOSIS — R293 Abnormal posture: Secondary | ICD-10-CM

## 2024-03-15 DIAGNOSIS — M5459 Other low back pain: Secondary | ICD-10-CM

## 2024-03-15 DIAGNOSIS — M6281 Muscle weakness (generalized): Secondary | ICD-10-CM

## 2024-03-15 DIAGNOSIS — M542 Cervicalgia: Secondary | ICD-10-CM

## 2024-03-15 DIAGNOSIS — G8929 Other chronic pain: Secondary | ICD-10-CM

## 2024-03-15 NOTE — Therapy (Signed)
 "     OUTPATIENT PHYSICAL THERAPY CERVICAL ASSESSMENT UPPER EXTREMITY and LUMBAR    Patient Name: Michelle Johnston MRN: [id number] DOB:03-Sep-1950, 74 y.o., female Today's Date: 03/15/2024  END OF SESSION:  PT End of Session - 03/15/24 0946     Visit Number 25    Date for Recertification  05/02/24    Authorization Type [employer] approved 12 visits 12/21/23-03/20/24    Authorization Time Period submitting auth from 03/21/24  - 05/02/24    Authorization - Visit Number 10    Authorization - Number of Visits 12    Progress Note Due on Visit 30    PT Start Time 0853    PT Stop Time 0932    PT Time Calculation (min) 39 min    Activity Tolerance Patient tolerated treatment well    Behavior During Therapy [facility] for tasks assessed/performed              Past Medical History:  Diagnosis Date   Anxiety    Arthritis    Atrial fibrillation (HCC)    Bradycardia    Bronchitis    Depression    Dyspnea    GERD (gastroesophageal reflux disease)    Glaucoma    Hypertension    Presence of Watchman left atrial appendage closure device 08/07/2021   Watchman FLX 27mm with Dr. Cindie   Seasonal allergies    Sleep apnea    does not use CPAP   Wears glasses    Past Surgical History:  Procedure Laterality Date   ABDOMINAL HYSTERECTOMY  1990   ATRIAL FIBRILLATION ABLATION N/A 08/12/2020   Procedure: ATRIAL FIBRILLATION ABLATION;  Surgeon: Cindie Ole DASEN, MD;  Location: [facility] INV[facility];  Service: Cardiovascular;  Laterality: N/A;   ATRIAL FIBRILLATION ABLATION N/A 03/26/2023   Procedure: ATRIAL FIBRILLATION ABLATION;  Surgeon: Cindie Ole DASEN, MD;  Location: [facility] INV[facility] CV [facility];  Service: Cardiovascular;  Laterality: N/A;   BREAST BIOPSY Right    benign   CARDIOVERSION N/A 08/15/2019   Procedure: CARDIOVERSION;  Surgeon: Elmira Newman PARAS, MD;  Location: [facility] ENDOSCOPY;  Service: Cardiovascular;  Laterality: N/A;   COLONOSCOPY     LEFT ATRIAL APPENDAGE OCCLUSION N/A 08/07/2021    Procedure: LEFT ATRIAL APPENDAGE OCCLUSION;  Surgeon: Cindie Ole DASEN, MD;  Location: [facility];  Service: Cardiovascular;  Laterality: N/A;   meniscus tear knee     left knee   NASAL SINUS SURGERY  1975   ORIF ANKLE FRACTURE  2009   left   ORIF HUMERUS FRACTURE Right 07/15/2023   Procedure: OPEN REDUCTION INTERNAL FIXATION (ORIF) DISTAL HUMERUS FRACTURE;  Surgeon: Cristy Bonner DASEN, MD;  Location: [facility];  Service: Orthopedics;  Laterality: Right;   REVERSE SHOULDER ARTHROPLASTY Left 09/02/2023   Procedure: ARTHROPLASTY, SHOULDER, TOTAL, REVERSE;  Surgeon: Cristy Bonner DASEN, MD;  Location: [facility] OR;  Service: Orthopedics;  Laterality: Left;   TEE WITHOUT CARDIOVERSION N/A 08/07/2021   Procedure: TRANSESOPHAGEAL ECHOCARDIOGRAM (TEE);  Surgeon: Cindie Ole DASEN, MD;  [facility]: [facility] INVASIVE CV LAB;  Service: Cardiovascular;  Laterality: N/A;   TOTAL KNEE ARTHROPLASTY Left 02/03/2019   Procedure: LEFT TOTAL KNEE ARTHROPLASTY;  Surgeon: Vernetta Lonni GRADE, MD;  Location: [facility] ORS;  Service: Orthopedics;  Laterality: Left;   TURBINATE REDUCTION Bilateral 06/04/2014   Procedure: BILATERAL TURBINATE REDUCTION;  Surgeon: Daniel Moccasin, MD;  Location: [facility];  Service: ENT;  Laterality: Bilateral;   TYMPANOSTOMY TUBE PLACEMENT     left   Patient  Active Problem List   Diagnosis Date Noted   Near syncope 09/11/2023   Impacted cerumen of left ear 08/15/2023   Other specified disorders of eustachian tube, left ear 02/06/2023   Central perforation of tympanic membrane of left ear 02/06/2023   Presence of Watchman left atrial appendage closure device 08/07/2021   Atrial fibrillation (HCC) 08/07/2021   OSA (obstructive sleep apnea) 10/29/2020   Persistent atrial fibrillation (HCC) 03/27/2020   Hypercoagulable state due to persistent atrial fibrillation (HCC) 03/27/2020   PAD (peripheral artery disease) 12/27/2019   Exertional chest pain 12/27/2019   Primary osteoarthritis  of right knee 07/12/2019   Paroxysmal atrial fibrillation (HCC) 06/16/2019   Nonrheumatic aortic valve insufficiency 06/16/2019   Nonrheumatic mitral valve regurgitation 06/16/2019   Tachycardia 02/20/2019   Abnormal EKG 02/20/2019   Essential hypertension 02/20/2019   Status post total left knee replacement 02/03/2019   Unilateral primary osteoarthritis, left knee 11/30/2016   Chronic pain of left knee 11/30/2016    PCP:   Loreli Kins, MD    REFERRING PROVIDER: Cristy Bonner DASEN, MD/ Margeret Kotyk, MD  REFERRING DIAG: left total shoulder arthoplasty ORIF tuberosities/ lumbar stenosis, cervical radiculopathy - added 03/03/24  THERAPY DIAG:  Radiculopathy, cervical region  Muscle weakness (generalized)  Cervicalgia  Abnormal posture  Chronic left shoulder pain  Other low back pain  Unsteadiness on feet  Lumbar spondylosis  Rationale for Evaluation and Treatment: Rehabilitation  ONSET DATE: Surgery 09/02/2023  SUBJECTIVE:                                                                                                                                                                                      SUBJECTIVE STATEMENT:  Patient reports she is doing okay today. Pain is 5-6/10.  POOL ACCESS: GAC - goes to water  aerobics 1-2x/wk  Hand dominance: Right  PERTINENT HISTORY: Anxiety; OA; Depression; HTN; Hx left TKA  PAIN:   PAIN:  Are you having pain? Yes NPRS scale: 8/10 Pain location: Rt elbow, medial forearm, digits 4, 5. Neck. Pain orientation: Right and Medial  PAIN TYPE: sharp and numb, weak Pain description: constant  Aggravating factors: fine motor tasks, sleeping, force through Rt UE like lifting, carrying, pushing Relieving factors: unsure   PRECAUTIONS: Shoulder and Fall see protocol in media  RED FLAGS:  None   WEIGHT BEARING RESTRICTIONS: No  FALLS:  Has patient fallen in last 6 months? Yes. Number of falls 2 fall. Patient had two  surgeries two weeks from each other due to falling. She fell while she was at the grocery store. She was turning and her husband said her feet did not move with her. PT  to address balance   LIVING ENVIRONMENT: Lives with: lives with their spouse Lives in: House/apartment Stairs: Yes: Internal: 12 steps; on left going up Has following equipment at home: Single point cane and Walker - 2 wheeled  OCCUPATION: Retired  PLOF: Independent, Independent with basic ADLs, Independent with household mobility without device, Independent with community mobility without device, Independent with gait, and Independent with transfers  PATIENT GOALS: use of my arm again. To get back to water  aerobics and drive  NEXT MD VISIT:   OBJECTIVE:  Note: Objective measures were completed at Evaluation unless otherwise noted.   PATIENT SURVEYS :  Initial eval QuickDASH Lt : 52.3/100 52.3%  12/15/23: QuickDASH Lt: 34.1/100 34.2%  01/31/24: QuickDASH Lt : 43.2/100 43.2%   12/01/23 ODI: 20/50= 40%  12/15/23 ODI: 21/50= 42%  01/31/24: ODI: 21/50= 42%  02/14/24 ODI 21/50 = 42%  03/03/24: NDI = 21/50, 42% UEFS = 30/80, 37.5%  COGNITION: Overall cognitive status: Within functional limits for tasks assessed     SENSATION: 03/03/24: Pt numb to light touch Rt UE digits 4, 5 and medial aspect of arm  WFL  POSTURE: 03/03/24: Rounded shoulders and forward head, increased cervical lordosis with very short posterior neck posture  Rounded shoulder & forward head Standing posture/ gait: right pelvic shift and left trunk lean  NECK ROM: 03/03/24 Flexion  45 with neck pain - central Extension 50 with neck pain - central Rt SB 20 with pain Lt SB 20 with pain Rt Rot 50 - Rt neck pain Lt Rot 40 - Rt neck pain   UPPER EXTREMITY ROM:   Passive ROM Right eval Left eval Left 11/17/23 Left 1015/25 Left 01/31/24 Left 12/15  Shoulder flexion  90 150 160 160 160  Shoulder extension  NT due to  restrictions      Shoulder abduction  75 85 120 125 125  Shoulder adduction        Shoulder internal rotation  NT due to restrictions  70 WNL WNL  Shoulder external rotation  NT due to restrictions 22 60 C7 C7  Elbow flexion        Elbow extension        Wrist flexion        Wrist extension        Wrist ulnar deviation        Wrist radial deviation        Wrist pronation        Wrist supination        (Blank rows = not tested)  11/17/23 seated scaption  5 deg, flex 68 deg  NECK RESISTED ISOMETRICS - WEAK AND PAINFUL  UPPER EXTREMITY MMT:  03/03/24: Rt shoulder: abd 3+/5, ER 3+/5, grip very weak on Rt: Grip Rt: 5lb grip Lt: 15lb  02/14/24: Grip Rt: 5lb grip Lt: 15lb Lt shoulder: 4/5 all muscles except 0/5 ER  NT due to weight restrictions of 1lb  LUMBAR ROM: All WFL  LOWER EXTREMITY MMT: significant proximal hip weakness, quad 4-/5,  DF 4-/5      01/31/24: right hip flexor 4/5   LOWER EXTREMITY ROM:  FUNCTIONAL TESTING Initial eval: 5STS: 17.47 sec no UE support TUG:14.32 sec  12/01/23: 5STS: 19.78 sec no UE support TUG:12.06 sec SLS: severe trendelenburg but able to hold approx 2-3 sec Tandem stance: needs help to get into position but able to hold 2-3 sec   12/15/23 5STS: 21.96 sec no UE support TUG:16.40 sec with cane  01/31/24 5STS: 21.30 sec with  UE support TUG:14.41 sec with cane  JOINT MOBILITY TESTING:  03/03/24 Significant joint restriction throughout c-spine U-joint sideglides, upglides in mid-lower cervical, all glides upper thoracic with more stiffness on Rt than Lt  See PROM measurement above Open end feel  PALPATION: Significant tenderness throughout cervical spine facets and soft tissues, spasm present in Rt upper trap                                                                                                                              TREATMENT DATE:  [facility] Adult PT Treatment:   03/15/2024 NuStep Level 5 5 mins- PT present to discuss  status Supine Chin tuck into pillow x10 5 sec hold Supine cervical rotation isometric x10 5 sec hold Supine cervical sidebend isometric x10 5 sec hold Cervical Melt Method (flexion/extension & rotation) x 15 each direction (somewhat uncomfortable)  Supine hooklying bil serratus punch 2lb x20  Manual: cervical distraction (light) and STM to suboccipitals and upper traps  03/03/24 Assessment of neck and Rt UE (see above) NDI and UEFS (see above) Initiated HEP Seated and supine neck retraction 10x5 holds Seated and supine neck isometrics 5x5 holds Educated Pt about dry needling, traction and other plans for PT to address symptoms before meeting with surgeon   03/04/23 UBE 3x3 L2 PT present to review status Supine bent knee fall out x10 each side with TA indraw Supine hooklying march x20 with TA indraw Supine bridge x10 Supine from hooklying LE dead bug slow controlled legs only x8 Supine 2lb Lt shoulder flexion x6, then drop to 1lb w/o rest and did 15 more reps Supine hooklying bil serratus punch 2lb x20 Supine hooklying hip abd red loop band at knees - x20 Pt education: nerve flossing and technique/progression with ulnar nerve on Rt side Standing Lt green tband shoulder ext x10 and tricep pressdown x10 Ball on wall standing x10 circles each way Lt UE   03/01/24: Pt seen for aquatic therapy today.  Treatment took place in water  3.5-4.75 ft in depth at the [facility] pool. Temp of water  was 91.  Pt entered/exited the pool via stairs independently with rail. Pt requires buoyancy of water  for support and to offload joints with strengthening exercises.  Seated water  bench with 75% submersion Pt performed seated LE AROM exercises 20x in all planes while discussing her status and pain. - 75% depth water  walking 10x in each direction, just natural arms by her side were better for elbow. -high knee marching arms by her side. 6 lengths - single arm holding green bell for first 5 reps  then elbow had some pain so we completed with just water  resistane , no bell. 2x10 - 6 min underwater bicycle in corner with yellow noodle in horseback -Front and side step ups to second step 10x Bil holding onto railings    PATIENT EDUCATION: Education details: PT eval findings, anticipated POC, progress with PT, and initial HEP Person educated: Patient Education  method: Explanation, Demonstration, and Handouts Education comprehension: verbalized understanding, returned demonstration, and needs further education  HOME EXERCISE PROGRAM: Access Code: [email or web address] URL: [email or web address] Date: 03/03/2024 Prepared by: Orvil Beuhring  Exercises - Supine Shoulder Flexion with Dowel  - 1 x daily - 7 x weekly - 1-2 sets - 10 reps - Seated Scapular Retraction  - 1 x daily - 7 x weekly - 1 sets - 10 reps - Isometric Shoulder Flexion  - 1 x daily - 7 x weekly - 2 sets - 5 reps - 5 hold - Standing Isometric Shoulder Abduction with Doorway - Arm Bent  - 1 x daily - 7 x weekly - 2 sets - 5 reps - 5 hold - Seated Shoulder Flexion AAROM with Pulley Behind  - 1 x daily - 7 x weekly - 2 sets - 10 reps - Seated Shoulder Scaption AAROM with Pulley at Side  - 1 x daily - 7 x weekly - 2 sets - 10 reps - Standing Hip Abduction with Counter Support  - 1 x daily - 7 x weekly - 2 sets - 10 reps - Standing Hip Extension with Leg Bent and Support  - 1 x daily - 7 x weekly - 2 sets - 10 reps - Clam  - 1 x daily - 7 x weekly - 2 sets - 10 reps - Sidelying Hip Abduction  - 1 x daily - 7 x weekly - 2 sets - 10 reps - Supine Cervical Retraction with Towel  - 1 x daily - 7 x weekly - 2 sets - 5 reps - 5 hold - Supine Isometric Neck Sidebend  - 1 x daily - 7 x weekly - 2 sets - 5 reps - 5 hold - Supine Isometric Neck Rotation  - 1 x daily - 7 x weekly - 2 sets - 5 reps - 5 hold  AQUATIC Access Code: [email or web address] URL: [email or web address] Prepared by: [facility]  [address] This aquatic home exercise program from MedBridge utilizes pictures from land based exercises, but has been adapted prior to lamination and issuance.   ASSESSMENT:  CLINICAL IMPRESSION:  Patient presents with moderate pain today. She has been compliant with updated HEP. Reviewed these exercises and patient required cues for proper performance of chin tuck exercises. Overall, she tolerated treatment session well. She responded well to manual techniques with light pressure. Educated patient on the benefit of trialing mechanical traction on a light setting. We will do this next session. Patient will benefit from skilled PT to address the below impairments and improve overall function.    OBJECTIVE IMPAIRMENTS: Abnormal gait, decreased balance, decreased ROM, decreased strength, hypomobility, increased muscle spasms, impaired flexibility, impaired UE functional use, postural dysfunction, and pain.   ACTIVITY LIMITATIONS: carrying, lifting, bending, squatting, stairs, bathing, dressing, reach over head, and hygiene/grooming, gripping  PARTICIPATION LIMITATIONS: meal prep, cleaning, laundry, interpersonal relationship, driving, shopping, and community activity  PERSONAL FACTORS: Age and 3+ comorbidities: falls risk HTN; anxiety; depression are also affecting patient's functional outcome.   REHAB POTENTIAL: Good  CLINICAL DECISION MAKING: Evolving/moderate complexity  EVALUATION COMPLEXITY: Moderate  GOALS: Goals reviewed with patient? Yes  SHORT TERM GOALS: Target date: 11/22/2023   Patient will be independent with initial HEP. Baseline:  Goal status: MET  2.  Patient will report > or = to 40% use of Lt UE with light home and self care tasks due to improved ROM and strength. Baseline:  Goal status: partially met  3.  Patient  will demonstrate correct seated and standing posture to reduce mechanical strain. Baseline:  Goal status: MET 12/15/23  4.  .  Patient will demonstrate  > or = to 100 degrees in Lt  shoulder P/ROM for improved reaching. Baseline:  Goal status: MET 150 deg 11/17/23  5.  Patient to report ability to ambulate for at least 5 min without need to sit.  Baseline:  Goal status: MET 12/15/23  6. Patient to obtain proper assistive device to reduce fall risk  Baseline: not using anything  Goals status: MET 12/15/23    LONG TERM GOALS: Target date: 05/01/24  Patient will demonstrate independence in advanced HEP. Baseline:  Goal status: INITIAL  2.  .  Patient will report > or = to 70% of normal use of Rt UE for ADLs due to improved functional strength and A/ROM. Baseline:  Goal status: INITIAL  3.  Patient will be able to return to return water  aerobics program with no difficulty to establish normal exercise routine Baseline:  Goal status: ONGOING 03/03/24   4.  Patient will be able to wash dishes and fold clothes with < or = to 2/10 left shoulder pain to for improved functional use of extremity. Baseline: washing dishes much improved, folding clothes is painful Goal status:In progress - 03/03/24  5.  Patient will demonstrate > or = to 115 degrees in Rt shoulder A/ROM for improved reaching overhead. Baseline:  Goal status: INITIAL  6.  Patient's Annitta will be 64/100 for improved functional use of Lt UE and decreased disability.  Baseline: 52.3/100 Goal status: ONGOING 03/03/24  7. ODI to improve to 16/50  Baseline: 20/50  Goal status: ONGOING 03/03/24  8. Patient to be able to stand or walk for at least 15 min without rest  Baseline:   Goal status:ONGOING 03/03/24 9. Patient will achieve Rt grip strength to within 5lb of Lt grip strength  Baseline: 5lb Rt, 15lb Lt  Goal status: NEW 03/03/24 10. Pt will improve UEFS score to at least 60% to demo improved functional use of dominant UE (right)  Baseline: 37.5%  Goal status: new 03/03/24 11. Pt will demo improved neck ROM by at least 10 deg in all planes of motion with min pain to improve  reading, driving, cooking.  Baseline: see above on 03/03/24  Goal status: new 03/03/24  PLAN: PT FREQUENCY: 1-2x/week  PT DURATION: 8 weeks  PLANNED INTERVENTIONS: 97164- PT Re-evaluation, 97110-Therapeutic exercises, 97530- Therapeutic activity, 97112- Neuromuscular re-education, 97535- Self Care, 02859- Manual therapy, [phone]- Gait training, [phone]- Canalith repositioning, [id number]- Aquatic Therapy, [phone]- Electrical stimulation (unattended), [phone]- Electrical stimulation (manual), [id number]- Vasopneumatic device, [id number]- Ultrasound, [id number]- Ionotophoresis 4mg /ml Dexamethasone , 79439 (1-2 muscles), [id number] (3+ muscles)- Dry Needling, Patient/Family education, Balance training, Stair training, Taping, Joint mobilization, Joint manipulation, Spinal manipulation, Spinal mobilization, Scar mobilization, Vestibular training, Cryotherapy, and Moist heat  PLAN FOR NEXT SESSION:  try mechanical traction (wasn't available last session), DN neck, grip and shoulder strength, postural strength, neck stabilization. For Lt shoulder and trunk/hips: Continue to work on strength, balance, fall prevention, left shoulder ROM and strength and pain control.      Kristeen Sar, PT, DPT 03/15/2024 9:48 AM [facility] [address] [place], [place] [address] Phone # [phone] Fax [phone]    "

## 2024-03-17 ENCOUNTER — Ambulatory Visit: Admitting: Physical Therapy

## 2024-03-17 ENCOUNTER — Encounter: Payer: Self-pay | Admitting: Physical Therapy

## 2024-03-17 DIAGNOSIS — M6281 Muscle weakness (generalized): Secondary | ICD-10-CM

## 2024-03-17 DIAGNOSIS — R293 Abnormal posture: Secondary | ICD-10-CM

## 2024-03-17 DIAGNOSIS — G8929 Other chronic pain: Secondary | ICD-10-CM

## 2024-03-17 DIAGNOSIS — M542 Cervicalgia: Secondary | ICD-10-CM

## 2024-03-17 DIAGNOSIS — M5412 Radiculopathy, cervical region: Secondary | ICD-10-CM | POA: Diagnosis not present

## 2024-03-17 NOTE — Therapy (Signed)
 "     OUTPATIENT PHYSICAL THERAPY CERVICAL UPPER EXTREMITY and LUMBAR TREATMENT    Patient Name: Michelle Johnston MRN: [id number] DOB:January 15, 1951, 74 y.o., female Today's Date: 03/17/2024  END OF SESSION:  PT End of Session - 03/17/24 0853     Visit Number 26    Number of Visits 26    Date for Recertification  05/02/24    Authorization Type [employer] approved 12 visits 12/21/23-03/20/24    Authorization Time Period submitting auth from 03/21/24  - 05/02/24    Authorization - Visit Number 11    Authorization - Number of Visits 12    Progress Note Due on Visit 30    PT Start Time 0850    PT Stop Time 0930    PT Time Calculation (min) 40 min    Activity Tolerance Patient tolerated treatment well    Behavior During Therapy [facility] for tasks assessed/performed               Past Medical History:  Diagnosis Date   Anxiety    Arthritis    Atrial fibrillation (HCC)    Bradycardia    Bronchitis    Depression    Dyspnea    GERD (gastroesophageal reflux disease)    Glaucoma    Hypertension    Presence of Watchman left atrial appendage closure device 08/07/2021   Watchman FLX 27mm with Dr. Cindie   Seasonal allergies    Sleep apnea    does not use CPAP   Wears glasses    Past Surgical History:  Procedure Laterality Date   ABDOMINAL HYSTERECTOMY  1990   ATRIAL FIBRILLATION ABLATION N/A 08/12/2020   Procedure: ATRIAL FIBRILLATION ABLATION;  Surgeon: Cindie Ole DASEN, MD;  Location: [facility];  Service: Cardiovascular;  Laterality: N/A;   ATRIAL FIBRILLATION ABLATION N/A 03/26/2023   Procedure: ATRIAL FIBRILLATION ABLATION;  Surgeon: Cindie Ole DASEN, MD;  Location: [facility];  Service: Cardiovascular;  Laterality: N/A;   BREAST BIOPSY Right    benign   CARDIOVERSION N/A 08/15/2019   Procedure: CARDIOVERSION;  Surgeon: Elmira Newman PARAS, MD;  Location: [facility];  Service: Cardiovascular;  Laterality: N/A;   COLONOSCOPY     LEFT ATRIAL APPENDAGE OCCLUSION  N/A 08/07/2021   Procedure: LEFT ATRIAL APPENDAGE OCCLUSION;  Surgeon: Cindie Ole DASEN, MD;  Location: [facility];  Service: Cardiovascular;  Laterality: N/A;   meniscus tear knee     left knee   NASAL SINUS SURGERY  1975   ORIF ANKLE FRACTURE  2009   left   ORIF HUMERUS FRACTURE Right 07/15/2023   Procedure: OPEN REDUCTION INTERNAL FIXATION (ORIF) DISTAL HUMERUS FRACTURE;  Surgeon: Cristy Bonner DASEN, MD;  Location: [facility];  Service: Orthopedics;  Laterality: Right;   REVERSE SHOULDER ARTHROPLASTY Left 09/02/2023   Procedure: ARTHROPLASTY, SHOULDER, TOTAL, REVERSE;  Surgeon: Cristy Bonner DASEN, MD;  Location: [facility] OR;  Service: Orthopedics;  Laterality: Left;   TEE WITHOUT CARDIOVERSION N/A 08/07/2021   Procedure: TRANSESOPHAGEAL ECHOCARDIOGRAM (TEE);  Surgeon: Cindie Ole DASEN, MD;  Location: [facility] INV[facility] CV LAB;  Service: Cardiovascular;  Laterality: N/A;   TOTAL KNEE ARTHROPLASTY Left 02/03/2019   Procedure: LEFT TOTAL KNEE ARTHROPLASTY;  Surgeon: Vernetta Lonni GRADE, MD;  Location: [facility];  Service: Orthopedics;  Laterality: Left;   TURBINATE REDUCTION Bilateral 06/04/2014   Procedure: BILATERAL TURBINATE REDUCTION;  Surgeon: Daniel Moccasin, MD;  Location: [facility];  Service: ENT;  Laterality: Bilateral;   TYMPANOSTOMY TUBE PLACEMENT  left   Patient Active Problem List   Diagnosis Date Noted   Near syncope 09/11/2023   Impacted cerumen of left ear 08/15/2023   Other specified disorders of eustachian tube, left ear 02/06/2023   Central perforation of tympanic membrane of left ear 02/06/2023   Presence of Watchman left atrial appendage closure device 08/07/2021   Atrial fibrillation (HCC) 08/07/2021   OSA (obstructive sleep apnea) 10/29/2020   Persistent atrial fibrillation (HCC) 03/27/2020   Hypercoagulable state due to persistent atrial fibrillation (HCC) 03/27/2020   PAD (peripheral artery disease) 12/27/2019   Exertional chest pain 12/27/2019    Primary osteoarthritis of right knee 07/12/2019   Paroxysmal atrial fibrillation (HCC) 06/16/2019   Nonrheumatic aortic valve insufficiency 06/16/2019   Nonrheumatic mitral valve regurgitation 06/16/2019   Tachycardia 02/20/2019   Abnormal EKG 02/20/2019   Essential hypertension 02/20/2019   Status post total left knee replacement 02/03/2019   Unilateral primary osteoarthritis, left knee 11/30/2016   Chronic pain of left knee 11/30/2016    PCP:   Loreli Kins, MD    REFERRING PROVIDER: Cristy Bonner DASEN, MD/ Ibazebo, [place], MD  REFERRING DIAG: left total shoulder arthoplasty ORIF tuberosities/ lumbar stenosis, cervical radiculopathy - added 03/03/24  THERAPY DIAG:  Radiculopathy, cervical region  Muscle weakness (generalized)  Cervicalgia  Abnormal posture  Chronic left shoulder pain  Rationale for Evaluation and Treatment: Rehabilitation  ONSET DATE: Surgery 09/02/2023  SUBJECTIVE:                                                                                                                                                                                      SUBJECTIVE STATEMENT:  I was surprised that I had more ease of motion in the neck after last time. I am getting a shot in my back next week.  POOL ACCESS: GAC - goes to water  aerobics 1-2x/wk  Hand dominance: Right  PERTINENT HISTORY: Anxiety; OA; Depression; HTN; Hx left TKA  PAIN:   PAIN:  Are you having pain? Yes NPRS scale: 3/10 Pain location: Rt elbow, medial forearm, digits 4, 5. Neck. Pain orientation: Right and Medial  PAIN TYPE: sharp and numb, weak Pain description: constant  Aggravating factors: fine motor tasks, sleeping, force through Rt UE like lifting, carrying, pushing Relieving factors: unsure   PRECAUTIONS: Shoulder and Fall see protocol in media  RED FLAGS:  None   WEIGHT BEARING RESTRICTIONS: No  FALLS:  Has patient fallen in last 6 months? Yes. Number of falls 2 fall. Patient  had two surgeries two weeks from each other due to falling. She fell while she was at the grocery store. She was turning and her husband said  her feet did not move with her. PT to address balance   LIVING ENVIRONMENT: Lives with: lives with their spouse Lives in: House/apartment Stairs: Yes: Internal: 12 steps; on left going up Has following equipment at home: Single point cane and Walker - 2 wheeled  OCCUPATION: Retired  PLOF: Independent, Independent with basic ADLs, Independent with household mobility without device, Independent with community mobility without device, Independent with gait, and Independent with transfers  PATIENT GOALS: use of my arm again. To get back to water  aerobics and drive  NEXT MD VISIT:   OBJECTIVE:  Note: Objective measures were completed at Evaluation unless otherwise noted.   PATIENT SURVEYS :  Initial eval QuickDASH Lt : 52.3/100 52.3%  12/15/23: QuickDASH Lt: 34.1/100 34.2%  01/31/24: QuickDASH Lt : 43.2/100 43.2%   12/01/23 ODI: 20/50= 40%  12/15/23 ODI: 21/50= 42%  01/31/24: ODI: 21/50= 42%  02/14/24 ODI 21/50 = 42%  03/03/24: NDI = 21/50, 42% UEFS = 30/80, 37.5%  COGNITION: Overall cognitive status: Within functional limits for tasks assessed     SENSATION: 03/03/24: Pt numb to light touch Rt UE digits 4, 5 and medial aspect of arm  WFL  POSTURE: 03/03/24: Rounded shoulders and forward head, increased cervical lordosis with very short posterior neck posture  Rounded shoulder & forward head Standing posture/ gait: right pelvic shift and left trunk lean  NECK ROM: 03/03/24 Flexion  45 with neck pain - central Extension 50 with neck pain - central Rt SB 20 with pain Lt SB 20 with pain Rt Rot 50 - Rt neck pain Lt Rot 40 - Rt neck pain   UPPER EXTREMITY ROM:   Passive ROM Right eval Left eval Left 11/17/23 Left 1015/25 Left 01/31/24 Left 12/15  Shoulder flexion  90 150 160 160 160  Shoulder extension  NT due to  restrictions      Shoulder abduction  75 85 120 125 125  Shoulder adduction        Shoulder internal rotation  NT due to restrictions  70 WNL WNL  Shoulder external rotation  NT due to restrictions 22 60 C7 C7  Elbow flexion        Elbow extension        Wrist flexion        Wrist extension        Wrist ulnar deviation        Wrist radial deviation        Wrist pronation        Wrist supination        (Blank rows = not tested)  11/17/23 seated scaption  5 deg, flex 68 deg  NECK RESISTED ISOMETRICS - WEAK AND PAINFUL  UPPER EXTREMITY MMT:  03/03/24: Rt shoulder: abd 3+/5, ER 3+/5, grip very weak on Rt: Grip Rt: 5lb grip Lt: 15lb  02/14/24: Grip Rt: 5lb grip Lt: 15lb Lt shoulder: 4/5 all muscles except 0/5 ER  NT due to weight restrictions of 1lb  LUMBAR ROM: All WFL  LOWER EXTREMITY MMT: significant proximal hip weakness, quad 4-/5,  DF 4-/5      01/31/24: right hip flexor 4/5   LOWER EXTREMITY ROM:  FUNCTIONAL TESTING Initial eval: 5STS: 17.47 sec no UE support TUG:14.32 sec  12/01/23: 5STS: 19.78 sec no UE support TUG:12.06 sec SLS: severe trendelenburg but able to hold approx 2-3 sec Tandem stance: needs help to get into position but able to hold 2-3 sec   12/15/23 5STS: 21.96 sec no UE support TUG:16.40 sec  with cane  01/31/24 5STS: 21.30 sec with UE support TUG:14.41 sec with cane  JOINT MOBILITY TESTING:  03/03/24 Significant joint restriction throughout c-spine U-joint sideglides, upglides in mid-lower cervical, all glides upper thoracic with more stiffness on Rt than Lt  See PROM measurement above Open end feel  PALPATION: Significant tenderness throughout cervical spine facets and soft tissues, spasm present in Rt upper trap                                                                                                                              TREATMENT DATE:  [facility] Adult PT Treatment:   03/17/24 UBE L3 2x2 - discussed DN and Pt wants to hold  off for now due to financial reasons Supine Chin tuck into pillow x10 5 sec hold Supine cervical rotation isometric x10 5 sec hold Supine cervical sidebend isometric x10 5 sec hold Gentle manual elongation traction along Rt neck in supine Gentle manual neck flexion with chin nod and extension isometric for posterior neck elongation  Supine pelvic tilts x10, add alt LE march, maintain chin nod SNAG seated assisted neck rotation and ext with towel  03/15/2024 NuStep Level 5 5 mins- PT present to discuss status Supine Chin tuck into pillow x10 5 sec hold Supine cervical rotation isometric x10 5 sec hold Supine cervical sidebend isometric x10 5 sec hold Cervical Melt Method (flexion/extension & rotation) x 15 each direction (somewhat uncomfortable)  Supine hooklying bil serratus punch 2lb x20  Manual: cervical distraction (light) and STM to suboccipitals and upper traps  03/03/24 Assessment of neck and Rt UE (see above) NDI and UEFS (see above) Initiated HEP Seated and supine neck retraction 10x5 holds Seated and supine neck isometrics 5x5 holds Educated Pt about dry needling, traction and other plans for PT to address symptoms before meeting with surgeon   03/04/23 UBE 3x3 L2 PT present to review status Supine bent knee fall out x10 each side with TA indraw Supine hooklying march x20 with TA indraw Supine bridge x10 Supine from hooklying LE dead bug slow controlled legs only x8 Supine 2lb Lt shoulder flexion x6, then drop to 1lb w/o rest and did 15 more reps Supine hooklying bil serratus punch 2lb x20 Supine hooklying hip abd red loop band at knees - x20 Pt education: nerve flossing and technique/progression with ulnar nerve on Rt side Standing Lt green tband shoulder ext x10 and tricep pressdown x10 Ball on wall standing x10 circles each way Lt UE   PATIENT EDUCATION: Education details: PT eval findings, anticipated POC, progress with PT, and initial HEP Person educated:  Patient Education method: Explanation, Demonstration, and Handouts Education comprehension: verbalized understanding, returned demonstration, and needs further education  HOME EXERCISE PROGRAM: Access Code: [email or web address] URL: [email or web address] Date: 03/03/2024 Prepared by: Orvil Leanette Eutsler  Exercises - Supine Shoulder Flexion with Dowel  - 1 x daily - 7 x weekly - 1-2 sets - 10 reps - Seated Scapular Retraction  -  1 x daily - 7 x weekly - 1 sets - 10 reps - Isometric Shoulder Flexion  - 1 x daily - 7 x weekly - 2 sets - 5 reps - 5 hold - Standing Isometric Shoulder Abduction with Doorway - Arm Bent  - 1 x daily - 7 x weekly - 2 sets - 5 reps - 5 hold - Seated Shoulder Flexion AAROM with Pulley Behind  - 1 x daily - 7 x weekly - 2 sets - 10 reps - Seated Shoulder Scaption AAROM with Pulley at Side  - 1 x daily - 7 x weekly - 2 sets - 10 reps - Standing Hip Abduction with Counter Support  - 1 x daily - 7 x weekly - 2 sets - 10 reps - Standing Hip Extension with Leg Bent and Support  - 1 x daily - 7 x weekly - 2 sets - 10 reps - Clam  - 1 x daily - 7 x weekly - 2 sets - 10 reps - Sidelying Hip Abduction  - 1 x daily - 7 x weekly - 2 sets - 10 reps - Supine Cervical Retraction with Towel  - 1 x daily - 7 x weekly - 2 sets - 5 reps - 5 hold - Supine Isometric Neck Sidebend  - 1 x daily - 7 x weekly - 2 sets - 5 reps - 5 hold - Supine Isometric Neck Rotation  - 1 x daily - 7 x weekly - 2 sets - 5 reps - 5 hold  AQUATIC Access Code: [email or web address] URL: [email or web address] Prepared by: [facility] [address] This aquatic home exercise program from MedBridge utilizes pictures from land based exercises, but has been adapted prior to lamination and issuance.   ASSESSMENT:  CLINICAL IMPRESSION:  Pt reported some improved ROM ease after last session.  She has been using heat for soreness. Her Rt UE pain and numbness continue intermittently but maybe not  as intensely.  Pt declined DN due to financial reasons. PT worked on neck stabilization and postural elongation of posterior neck using passive and active techniques.  Pt with good demo end of session of improved postural alignment understanding.  She unfortunately does not feel any relief of Rt UE symptoms with PT so far. She gets a lumbar injection next week and sees neck surgeon in early Feb.    OBJECTIVE IMPAIRMENTS: Abnormal gait, decreased balance, decreased ROM, decreased strength, hypomobility, increased muscle spasms, impaired flexibility, impaired UE functional use, postural dysfunction, and pain.   ACTIVITY LIMITATIONS: carrying, lifting, bending, squatting, stairs, bathing, dressing, reach over head, and hygiene/grooming, gripping  PARTICIPATION LIMITATIONS: meal prep, cleaning, laundry, interpersonal relationship, driving, shopping, and community activity  PERSONAL FACTORS: Age and 3+ comorbidities: falls risk HTN; anxiety; depression are also affecting patient's functional outcome.   REHAB POTENTIAL: Good  CLINICAL DECISION MAKING: Evolving/moderate complexity  EVALUATION COMPLEXITY: Moderate  GOALS: Goals reviewed with patient? Yes  SHORT TERM GOALS: Target date: 11/22/2023   Patient will be independent with initial HEP. Baseline:  Goal status: MET  2.  Patient will report > or = to 40% use of Lt UE with light home and self care tasks due to improved ROM and strength. Baseline:  Goal status: partially met  3.  Patient will demonstrate correct seated and standing posture to reduce mechanical strain. Baseline:  Goal status: MET 12/15/23  4.  .  Patient will demonstrate > or = to 100 degrees in Lt  shoulder P/ROM for improved  reaching. Baseline:  Goal status: MET 150 deg 11/17/23  5.  Patient to report ability to ambulate for at least 5 min without need to sit.  Baseline:  Goal status: MET 12/15/23  6. Patient to obtain proper assistive device to reduce fall  risk  Baseline: not using anything  Goals status: MET 12/15/23    LONG TERM GOALS: Target date: 05/01/24  Patient will demonstrate independence in advanced HEP. Baseline:  Goal status: INITIAL  2.  .  Patient will report > or = to 70% of normal use of Rt UE for ADLs due to improved functional strength and A/ROM. Baseline:  Goal status: INITIAL  3.  Patient will be able to return to return water  aerobics program with no difficulty to establish normal exercise routine Baseline:  Goal status: ONGOING 03/03/24   4.  Patient will be able to wash dishes and fold clothes with < or = to 2/10 left shoulder pain to for improved functional use of extremity. Baseline: washing dishes much improved, folding clothes is painful Goal status:In progress - 03/03/24  5.  Patient will demonstrate > or = to 115 degrees in Rt shoulder A/ROM for improved reaching overhead. Baseline:  Goal status: INITIAL  6.  Patient's Annitta will be 64/100 for improved functional use of Lt UE and decreased disability.  Baseline: 52.3/100 Goal status: ONGOING 03/03/24  7. ODI to improve to 16/50  Baseline: 20/50  Goal status: ONGOING 03/03/24  8. Patient to be able to stand or walk for at least 15 min without rest  Baseline:   Goal status:ONGOING 03/03/24 9. Patient will achieve Rt grip strength to within 5lb of Lt grip strength  Baseline: 5lb Rt, 15lb Lt  Goal status: NEW 03/03/24 10. Pt will improve UEFS score to at least 60% to demo improved functional use of dominant UE (right)  Baseline: 37.5%  Goal status: new 03/03/24 11. Pt will demo improved neck ROM by at least 10 deg in all planes of motion with min pain to improve reading, driving, cooking.  Baseline: see above on 03/03/24  Goal status: new 03/03/24  PLAN: PT FREQUENCY: 1-2x/week  PT DURATION: 8 weeks  PLANNED INTERVENTIONS: 97164- PT Re-evaluation, 97110-Therapeutic exercises, 97530- Therapeutic activity, 97112- Neuromuscular re-education, 97535- Self  Care, 02859- Manual therapy, [phone]- Gait training, [phone]- Canalith repositioning, [phone]- Aquatic Therapy, [phone]- Electrical stimulation (unattended), [phone]- Electrical stimulation (manual), [id number]- Vasopneumatic device, [id number]- Ultrasound, [id number]- Ionotophoresis 4mg /ml Dexamethasone , 79439 (1-2 muscles), [id number] (3+ muscles)- Dry Needling, Patient/Family education, Balance training, Stair training, Taping, Joint mobilization, Joint manipulation, Spinal manipulation, Spinal mobilization, Scar mobilization, Vestibular training, Cryotherapy, and Moist heat  PLAN FOR NEXT SESSION:  hold mechanical traction for now due to needing gentle techniques with ongoing assessment, hold DN for now due to financial reasons, grip and shoulder strength, postural strength, neck stabilization. For Lt shoulder and trunk/hips: Continue to work on strength, balance, fall prevention, left shoulder ROM and strength and pain control.      Orvil Fester, PT 03/17/24 9:34 AM  [facility] [address], [address] [place], [place] [address] Phone # [phone] Fax [phone]    "

## 2024-03-22 ENCOUNTER — Encounter: Payer: Self-pay | Admitting: Physical Therapy

## 2024-03-22 ENCOUNTER — Other Ambulatory Visit (INDEPENDENT_AMBULATORY_CARE_PROVIDER_SITE_OTHER): Payer: Self-pay | Admitting: Otolaryngology

## 2024-03-22 ENCOUNTER — Ambulatory Visit: Admitting: Physical Therapy

## 2024-03-22 DIAGNOSIS — M542 Cervicalgia: Secondary | ICD-10-CM

## 2024-03-22 DIAGNOSIS — M5412 Radiculopathy, cervical region: Secondary | ICD-10-CM

## 2024-03-22 DIAGNOSIS — M6281 Muscle weakness (generalized): Secondary | ICD-10-CM

## 2024-03-22 DIAGNOSIS — G8929 Other chronic pain: Secondary | ICD-10-CM

## 2024-03-22 DIAGNOSIS — R293 Abnormal posture: Secondary | ICD-10-CM

## 2024-03-22 DIAGNOSIS — H903 Sensorineural hearing loss, bilateral: Secondary | ICD-10-CM

## 2024-03-22 NOTE — Therapy (Signed)
 "     OUTPATIENT PHYSICAL THERAPY CERVICAL UPPER EXTREMITY and LUMBAR TREATMENT    Patient Name: Michelle Johnston MRN: [id number] DOB:[birth year], 74 y.o., female Today's Date: 03/22/2024  END OF SESSION:  PT End of Session - 03/22/24 0957     Visit Number 27    Number of Visits 26    Date for Recertification  05/02/24    Authorization Type [employer] Approved 12vl 03/21/24-4/202/2026    Authorization Time Period 03/21/24-4/202/2026    Authorization - Visit Number 1    Authorization - Number of Visits 12    Progress Note Due on Visit 30    PT Start Time 0846    PT Stop Time 0929    PT Time Calculation (min) 43 min    Activity Tolerance Patient tolerated treatment well    Behavior During Therapy [facility] for tasks assessed/performed                Past Medical History:  Diagnosis Date   Anxiety    Arthritis    Atrial fibrillation (HCC)    Bradycardia    Bronchitis    Depression    Dyspnea    GERD (gastroesophageal reflux disease)    Glaucoma    Hypertension    Presence of Watchman left atrial appendage closure device 08/07/2021   Watchman FLX 27mm with Dr. Cindie   Seasonal allergies    Sleep apnea    does not use CPAP   Wears glasses    Past Surgical History:  Procedure Laterality Date   ABDOMINAL HYSTERECTOMY  1990   ATRIAL FIBRILLATION ABLATION N/A 08/12/2020   Procedure: ATRIAL FIBRILLATION ABLATION;  Surgeon: Cindie Ole DASEN, MD;  Location: [facility];  Service: Cardiovascular;  Laterality: N/A;   ATRIAL FIBRILLATION ABLATION N/A 03/26/2023   Procedure: ATRIAL FIBRILLATION ABLATION;  Surgeon: Cindie Ole DASEN, MD;  Location: [facility];  Service: Cardiovascular;  Laterality: N/A;   BREAST BIOPSY Right    benign   CARDIOVERSION N/A 08/15/2019   Procedure: CARDIOVERSION;  Surgeon: Elmira Newman PARAS, MD;  Location: [facility];  Service: Cardiovascular;  Laterality: N/A;   COLONOSCOPY     LEFT ATRIAL APPENDAGE OCCLUSION N/A 08/07/2021    Procedure: LEFT ATRIAL APPENDAGE OCCLUSION;  Surgeon: Cindie Ole DASEN, MD;  Location: [facility];  Service: Cardiovascular;  Laterality: N/A;   meniscus tear knee     left knee   NASAL SINUS SURGERY  1975   ORIF ANKLE FRACTURE  2009   left   ORIF HUMERUS FRACTURE Right 07/15/2023   Procedure: OPEN REDUCTION INTERNAL FIXATION (ORIF) DISTAL HUMERUS FRACTURE;  Surgeon: Cristy Bonner DASEN, MD;  Location: [facility];  Service: Orthopedics;  Laterality: Right;   REVERSE SHOULDER ARTHROPLASTY Left 09/02/2023   Procedure: ARTHROPLASTY, SHOULDER, TOTAL, REVERSE;  Surgeon: Cristy Bonner DASEN, MD;  Location: [facility];  Service: Orthopedics;  Laterality: Left;   TEE WITHOUT CARDIOVERSION N/A 08/07/2021   Procedure: TRANSESOPHAGEAL ECHOCARDIOGRAM (TEE);  Surgeon: Cindie Ole DASEN, MD;  Location: [facility] INVASIVE CV [facility];  Service: Cardiovascular;  Laterality: N/A;   TOTAL KNEE ARTHROPLASTY Left 02/03/2019   Procedure: LEFT TOTAL KNEE ARTHROPLASTY;  Surgeon: Vernetta Lonni GRADE, MD;  Location: [facility];  Service: Orthopedics;  Laterality: Left;   TURBINATE REDUCTION Bilateral 06/04/2014   Procedure: BILATERAL TURBINATE REDUCTION;  Surgeon: Daniel Moccasin, MD;  Location: [facility];  Service: ENT;  Laterality: Bilateral;   TYMPANOSTOMY TUBE PLACEMENT     left  Patient Active Problem List   Diagnosis Date Noted   Near syncope 09/11/2023   Impacted cerumen of left ear 08/15/2023   Other specified disorders of eustachian tube, left ear 02/06/2023   Central perforation of tympanic membrane of left ear 02/06/2023   Presence of Watchman left atrial appendage closure device 08/07/2021   Atrial fibrillation (HCC) 08/07/2021   OSA (obstructive sleep apnea) 10/29/2020   Persistent atrial fibrillation (HCC) 03/27/2020   Hypercoagulable state due to persistent atrial fibrillation (HCC) 03/27/2020   PAD (peripheral artery disease) 12/27/2019   Exertional chest pain 12/27/2019   Primary osteoarthritis  of right knee 07/12/2019   Paroxysmal atrial fibrillation (HCC) 06/16/2019   Nonrheumatic aortic valve insufficiency 06/16/2019   Nonrheumatic mitral valve regurgitation 06/16/2019   Tachycardia 02/20/2019   Abnormal EKG 02/20/2019   Essential hypertension 02/20/2019   Status post total left knee replacement 02/03/2019   Unilateral primary osteoarthritis, left knee 11/30/2016   Chronic pain of left knee 11/30/2016    PCP:   Loreli Kins, MD    REFERRING PROVIDER: Cristy Bonner DASEN, MD/ [place]baz[place]o, [place], MD  REFERRING DIAG: left total shoulder arthoplasty ORIF tuberosities/ lumbar stenosis, cervical radiculopathy - added 03/03/24  THERAPY DIAG:  Radiculopathy, cervical region  Muscle weakness (generalized)  Cervicalgia  Abnormal posture  Chronic left shoulder pain  Rationale for Evaluation and Treatment: Rehabilitation  ONSET DATE: Surgery 09/02/2023  SUBJECTIVE:                                                                                                                                                                                      SUBJECTIVE STATEMENT:  Patient reports she is doing good today. Her neck is improving. She is not currently having any pain.  POOL ACCESS: GAC - goes to water  aerobics 1-2x/wk  Hand dominance: Right  PERTINENT HISTORY: Anxiety; OA; Depression; HTN; Hx left TKA  PAIN:   PAIN:  Are you having pain? Yes NPRS scale: 3/10 Pain location: Rt elbow, medial forearm, digits 4, 5. Neck. Pain orientation: Right and Medial  PAIN TYPE: sharp and numb, weak Pain description: constant  Aggravating factors: fine motor tasks, sleeping, force through Rt UE like lifting, carrying, pushing Relieving factors: unsure   PRECAUTIONS: Shoulder and Fall see protocol in media  RED FLAGS:  None   WEIGHT BEARING RESTRICTIONS: No  FALLS:  Has patient fallen in last 6 months? Yes. Number of falls 2 fall. Patient had two surgeries two weeks from each  other due to falling. She fell while she was at the grocery store. She was turning and her husband said her feet did not move with her. PT to address balance  LIVING ENVIRONMENT: Lives with: lives with their spouse Lives in: House/apartment Stairs: Yes: Internal: 12 steps; on left going up Has following equipment at home: Single point cane and Walker - 2 wheeled  OCCUPATION: Retired  PLOF: Independent, Independent with basic ADLs, Independent with household mobility without device, Independent with community mobility without device, Independent with gait, and Independent with transfers  PATIENT GOALS: use of my arm again. To get back to water  aerobics and drive  NEXT MD VISIT:   OBJECTIVE:  Note: Objective measures were completed at Evaluation unless otherwise noted.   PATIENT SURVEYS :  Initial eval QuickDASH Lt : 52.3/100 52.3%  12/15/23: QuickDASH Lt: 34.1/100 34.2%  01/31/24: QuickDASH Lt : 43.2/100 43.2%   12/01/23 ODI: 20/50= 40%  12/15/23 ODI: 21/50= 42%  01/31/24: ODI: 21/50= 42%  02/14/24 ODI 21/50 = 42%  03/03/24: NDI = 21/50, 42% UEFS = 30/80, 37.5%  COGNITION: Overall cognitive status: Within functional limits for tasks assessed     SENSATION: 03/03/24: Pt numb to light touch Rt UE digits 4, 5 and medial aspect of arm  WFL  POSTURE: 03/03/24: Rounded shoulders and forward head, increased cervical lordosis with very short posterior neck posture  Rounded shoulder & forward head Standing posture/ gait: right pelvic shift and left trunk lean  NECK ROM: 03/03/24 Flexion  45 with neck pain - central Extension 50 with neck pain - central Rt SB 20 with pain Lt SB 20 with pain Rt Rot 50 - Rt neck pain Lt Rot 40 - Rt neck pain   UPPER EXTREMITY ROM:   Passive ROM Right eval Left eval Left 11/17/23 Left 1015/25 Left 01/31/24 Left 12/15  Shoulder flexion  90 150 160 160 160  Shoulder extension  NT due to restrictions      Shoulder abduction   75 85 120 125 125  Shoulder adduction        Shoulder internal rotation  NT due to restrictions  70 WNL WNL  Shoulder external rotation  NT due to restrictions 22 60 C7 C7  Elbow flexion        Elbow extension        Wrist flexion        Wrist extension        Wrist ulnar deviation        Wrist radial deviation        Wrist pronation        Wrist supination        (Blank rows = not tested)  11/17/23 seated scaption  5 deg, flex 68 deg  NECK RESISTED ISOMETRICS - WEAK AND PAINFUL  UPPER EXTREMITY MMT:  03/03/24: Rt shoulder: abd 3+/5, ER 3+/5, grip very weak on Rt: Grip Rt: 5lb grip Lt: 15lb  02/14/24: Grip Rt: 5lb grip Lt: 15lb Lt shoulder: 4/5 all muscles except 0/5 ER  NT due to weight restrictions of 1lb  LUMBAR ROM: All WFL  LOWER EXTREMITY MMT: significant proximal hip weakness, quad 4-/5,  DF 4-/5      01/31/24: right hip flexor 4/5   LOWER EXTREMITY ROM:  FUNCTIONAL TESTING Initial eval: 5STS: 17.47 sec no UE support TUG:14.32 sec  12/01/23: 5STS: 19.78 sec no UE support TUG:12.06 sec SLS: severe trendelenburg but able to hold approx 2-3 sec Tandem stance: needs help to get into position but able to hold 2-3 sec   12/15/23 5STS: 21.96 sec no UE support TUG:16.40 sec with cane  01/31/24 5STS: 21.30 sec with UE support TUG:14.41 sec with  cane  JOINT MOBILITY TESTING:  03/03/24 Significant joint restriction throughout c-spine U-joint sideglides, upglides in mid-lower cervical, all glides upper thoracic with more stiffness on Rt than Lt  See PROM measurement above Open end feel  PALPATION: Significant tenderness throughout cervical spine facets and soft tissues, spasm present in Rt upper trap                                                                                                                              TREATMENT DATE:  [facility] Adult PT Treatment:   03/22/24 NuStep Level 4 7 mins- PT present to discuss status Seated pronation/supination 1#  DB x 10 Lt  Seated radial & ulnar deviation 1# DB x 10 Lt  Seated shoulder flexion & scaption 1# DB x 10 each direction (very challenging on left, very fatigued) Seated triceps extension with yellow TB 2 x 10 bilateral  Supine pelvic tilts x10, add alt LE march, maintain chin nod Supine Bridge x 10  Supine cervical rotation x 10 each direction 5 sec hold Supine shoulder flexion with dowel x 10  Gentle manual neck flexion with chin nod and extension isometric for posterior neck elongation    03/17/24 UBE L3 2x2 - discussed DN and Pt wants to hold off for now due to financial reasons Supine Chin tuck into pillow x10 5 sec hold Supine cervical rotation isometric x10 5 sec hold Supine cervical sidebend isometric x10 5 sec hold Gentle manual elongation traction along Rt neck in supine Gentle manual neck flexion with chin nod and extension isometric for posterior neck elongation  Supine pelvic tilts x10, add alt LE march, maintain chin nod SNAG seated assisted neck rotation and ext with towel  03/15/2024 NuStep Level 5 5 mins- PT present to discuss status Supine Chin tuck into pillow x10 5 sec hold Supine cervical rotation isometric x10 5 sec hold Supine cervical sidebend isometric x10 5 sec hold Cervical Melt Method (flexion/extension & rotation) x 15 each direction (somewhat uncomfortable)  Supine hooklying bil serratus punch 2lb x20  Manual: cervical distraction (light) and STM to suboccipitals and upper traps  03/03/24 Assessment of neck and Rt UE (see above) NDI and UEFS (see above) Initiated HEP Seated and supine neck retraction 10x5 holds Seated and supine neck isometrics 5x5 holds Educated Pt about dry needling, traction and other plans for PT to address symptoms before meeting with surgeon   03/04/23 UBE 3x3 L2 PT present to review status Supine bent knee fall out x10 each side with TA indraw Supine hooklying march x20 with TA indraw Supine bridge x10 Supine from hooklying  LE dead bug slow controlled legs only x8 Supine 2lb Lt shoulder flexion x6, then drop to 1lb w/o rest and did 15 more reps Supine hooklying bil serratus punch 2lb x20 Supine hooklying hip abd red loop band at knees - x20 Pt education: nerve flossing and technique/progression with ulnar nerve on Rt side Standing Lt green tband shoulder  ext x10 and tricep pressdown x10 Ball on wall standing x10 circles each way Lt UE   PATIENT EDUCATION: Education details: PT eval findings, anticipated POC, progress with PT, and initial HEP Person educated: Patient Education method: Explanation, Demonstration, and Handouts Education comprehension: verbalized understanding, returned demonstration, and needs further education  HOME EXERCISE PROGRAM: Access Code: [email or web address] URL: [email or web address] Date: 03/03/2024 Prepared by: Orvil Beuhring  Exercises - Supine Shoulder Flexion with Dowel  - 1 x daily - 7 x weekly - 1-2 sets - 10 reps - Seated Scapular Retraction  - 1 x daily - 7 x weekly - 1 sets - 10 reps - Isometric Shoulder Flexion  - 1 x daily - 7 x weekly - 2 sets - 5 reps - 5 hold - Standing Isometric Shoulder Abduction with Doorway - Arm Bent  - 1 x daily - 7 x weekly - 2 sets - 5 reps - 5 hold - Seated Shoulder Flexion AAROM with Pulley Behind  - 1 x daily - 7 x weekly - 2 sets - 10 reps - Seated Shoulder Scaption AAROM with Pulley at Side  - 1 x daily - 7 x weekly - 2 sets - 10 reps - Standing Hip Abduction with Counter Support  - 1 x daily - 7 x weekly - 2 sets - 10 reps - Standing Hip Extension with Leg Bent and Support  - 1 x daily - 7 x weekly - 2 sets - 10 reps - Clam  - 1 x daily - 7 x weekly - 2 sets - 10 reps - Sidelying Hip Abduction  - 1 x daily - 7 x weekly - 2 sets - 10 reps - Supine Cervical Retraction with Towel  - 1 x daily - 7 x weekly - 2 sets - 5 reps - 5 hold - Supine Isometric Neck Sidebend  - 1 x daily - 7 x weekly - 2 sets - 5 reps - 5 hold - Supine  Isometric Neck Rotation  - 1 x daily - 7 x weekly - 2 sets - 5 reps - 5 hold  AQUATIC Access Code: [email or web address] URL: [email or web address] Prepared by: [facility] - [address] This aquatic home exercise program from MedBridge utilizes pictures from land based exercises, but has been adapted prior to lamination and issuance.   ASSESSMENT:  CLINICAL IMPRESSION:  Staci continues to reports improvements in neck ROM and decreased pain. She is independent with current HEP. She still demonstrates limited ROM and strength in left UE, so incorporated exercises today targeting that. Educated patient on the exercise she can perform at home to improve left UE function. She did well with all exercises today and did not verbalized any increased pain or discomfort. She responds well to gentle manual techniques targeting posterior neck musculature.  Patient demonstrates good rehab potential to achieve stated goals through skilled therapy intervention.       OBJECTIVE IMPAIRMENTS: Abnormal gait, decreased balance, decreased ROM, decreased strength, hypomobility, increased muscle spasms, impaired flexibility, impaired UE functional use, postural dysfunction, and pain.   ACTIVITY LIMITATIONS: carrying, lifting, bending, squatting, stairs, bathing, dressing, reach over head, and hygiene/grooming, gripping  PARTICIPATION LIMITATIONS: meal prep, cleaning, laundry, interpersonal relationship, driving, shopping, and community activity  PERSONAL FACTORS: Age and 3+ comorbidities: falls risk HTN; anxiety; depression are also affecting patient's functional outcome.   REHAB POTENTIAL: Good  CLINICAL DECISION MAKING: Evolving/moderate complexity  EVALUATION COMPLEXITY: Moderate  GOALS: Goals reviewed with patient? Yes  SHORT TERM GOALS: Target date:  11/22/2023   Patient will be independent with initial HEP. Baseline:  Goal status: MET  2.  Patient will report > or = to 40% use of  Lt UE with light home and self care tasks due to improved ROM and strength. Baseline:  Goal status: partially met  3.  Patient will demonstrate correct seated and standing posture to reduce mechanical strain. Baseline:  Goal status: MET 12/15/23  4.  .  Patient will demonstrate > or = to 100 degrees in Lt  shoulder P/ROM for improved reaching. Baseline:  Goal status: MET 150 deg 11/17/23  5.  Patient to report ability to ambulate for at least 5 min without need to sit.  Baseline:  Goal status: MET 12/15/23  6. Patient to obtain proper assistive device to reduce fall risk  Baseline: not using anything  Goals status: MET 12/15/23    LONG TERM GOALS: Target date: 05/01/24  Patient will demonstrate independence in advanced HEP. Baseline:  Goal status: INITIAL  2.  .  Patient will report > or = to 70% of normal use of Rt UE for ADLs due to improved functional strength and A/ROM. Baseline:  Goal status: INITIAL  3.  Patient will be able to return to return water  aerobics program with no difficulty to establish normal exercise routine Baseline:  Goal status: ONGOING 03/03/24   4.  Patient will be able to wash dishes and fold clothes with < or = to 2/10 left shoulder pain to for improved functional use of extremity. Baseline: washing dishes much improved, folding clothes is painful Goal status:In progress - 03/03/24  5.  Patient will demonstrate > or = to 115 degrees in Rt shoulder A/ROM for improved reaching overhead. Baseline:  Goal status: INITIAL  6.  Patient's Annitta will be 64/100 for improved functional use of Lt UE and decreased disability.  Baseline: 52.3/100 Goal status: ONGOING 03/03/24  7. ODI to improve to 16/50  Baseline: 20/50  Goal status: ONGOING 03/03/24  8. Patient to be able to stand or walk for at least 15 min without rest  Baseline:   Goal status:ONGOING 03/03/24 9. Patient will achieve Rt grip strength to within 5lb of Lt grip strength  Baseline: 5lb Rt,  15lb Lt  Goal status: NEW 03/03/24 10. Pt will improve UEFS score to at least 60% to demo improved functional use of dominant UE (right)  Baseline: 37.5%  Goal status: new 03/03/24 11. Pt will demo improved neck ROM by at least 10 deg in all planes of motion with min pain to improve reading, driving, cooking.  Baseline: see above on 03/03/24  Goal status: new 03/03/24  PLAN: PT FREQUENCY: 1-2x/week  PT DURATION: 8 weeks  PLANNED INTERVENTIONS: 97164- PT Re-evaluation, 97110-Therapeutic exercises, 97530- Therapeutic activity, 97112- Neuromuscular re-education, 97535- Self Care, 02859- Manual therapy, [phone]- Gait training, [phone]- Canalith repositioning, [phone]- Aquatic Therapy, [phone]- Electrical stimulation (unattended), [phone]- Electrical stimulation (manual), [id number]- Vasopneumatic device, [id number]- Ultrasound, [id number]- Ionotophoresis 4mg /ml Dexamethasone , 79439 (1-2 muscles), 20561 (3+ muscles)- Dry Needling, Patient/Family education, Balance training, Stair training, Taping, Joint mobilization, Joint manipulation, Spinal manipulation, Spinal mobilization, Scar mobilization, Vestibular training, Cryotherapy, and Moist heat  PLAN FOR NEXT SESSION:  hold mechanical traction for now due to needing gentle techniques with ongoing assessment, hold DN for now due to financial reasons, grip and shoulder strength, postural strength, neck stabilization. For Lt shoulder and trunk/hips: Continue to work on strength, balance, fall prevention, left shoulder ROM and strength and pain control.  Kristeen Sar, PT, DPT 03/22/24 9:59 AM [facility] [address] [place], [place] [address] Phone # [phone] Fax [phone]    "

## 2024-03-23 ENCOUNTER — Ambulatory Visit (INDEPENDENT_AMBULATORY_CARE_PROVIDER_SITE_OTHER): Admitting: Otolaryngology

## 2024-03-23 ENCOUNTER — Encounter (INDEPENDENT_AMBULATORY_CARE_PROVIDER_SITE_OTHER): Payer: Self-pay | Admitting: Otolaryngology

## 2024-03-23 ENCOUNTER — Ambulatory Visit (INDEPENDENT_AMBULATORY_CARE_PROVIDER_SITE_OTHER): Admitting: Audiology

## 2024-03-23 VITALS — BP 150/72 | HR 65

## 2024-03-23 DIAGNOSIS — H90A32 Mixed conductive and sensorineural hearing loss, unilateral, left ear with restricted hearing on the contralateral side: Secondary | ICD-10-CM | POA: Diagnosis not present

## 2024-03-23 DIAGNOSIS — H90A11 Conductive hearing loss, unilateral, right ear with restricted hearing on the contralateral side: Secondary | ICD-10-CM

## 2024-03-23 DIAGNOSIS — H900 Conductive hearing loss, bilateral: Secondary | ICD-10-CM

## 2024-03-23 DIAGNOSIS — Z9629 Presence of other otological and audiological implants: Secondary | ICD-10-CM

## 2024-03-23 DIAGNOSIS — Z8669 Personal history of other diseases of the nervous system and sense organs: Secondary | ICD-10-CM | POA: Diagnosis not present

## 2024-03-23 DIAGNOSIS — H6982 Other specified disorders of Eustachian tube, left ear: Secondary | ICD-10-CM | POA: Diagnosis not present

## 2024-03-23 DIAGNOSIS — H7202 Central perforation of tympanic membrane, left ear: Secondary | ICD-10-CM

## 2024-03-23 NOTE — Progress Notes (Signed)
" °  [address], [address] [place], [place] [address] [phone]  Audiological Evaluation    Name: Michelle Johnston     DOB:   1950/08/17      MRN:   [id number]                                                                                     Service Date: 03/23/2024     Accompanied by: self   Patient comes today after Dr. Karis, ENT sent a referral for a hearing evaluation due to concerns with hearing loss.   Symptoms Yes Details  Hearing loss  [x]  Previous audiograms at [facility]. Says she struggles hearing.  Tinnitus  []    Ear pain/ infections/pressure  []    Balance problems  []    Noise exposure history  []    Previous ear surgeries  [x]  Left ear tube maybe was placed around 1 year ago  Family history of hearing loss  []    Amplification  []    Other  []      Otoscopy: Right ear: Abnormal eardrum appearance. Left ear:  Wax observed in the ear canal and pressure equalization tube was visualized.  Tympanometry: Right ear: Type A - Normal external ear canal volume with normal middle ear pressure and normal tympanic membrane compliance. Findings are consistent with normal middle ear function. Left ear: Type B - Normal external ear canal volume with no middle ear pressure peak or tympanic membrane compliance.  Findings are consistent with abnormal middle ear function.    Hearing Evaluation The hearing test results were completed under headphones and results are deemed to be of good to fair reliability. Test technique:  conventional    Pure tone Audiometry: Right ear-  Borderline hearing level 250 Hz to 2000 Hz (some air-bone gaps noted), then mild to moderately severe conductive hearing loss from 4000 Hz - 8000 Hz. 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  Speech Audiometry: Right ear- Speech Reception Threshold (SRT) was obtained at 30 dBHL. Left ear-Speech Reception Threshold (SRT) was obtained at 35 dBHL.   Word Recognition Score Tested  using NU-6 (recorded) Right ear: 100% was obtained at a presentation level of 75 dBHL with contralateral masking which is deemed as  excellent. Left ear: 100% was obtained at a presentation level of 75 dBHL with contralateral masking which is deemed as  excellent.   Impression: There is a difference in pure-tones, worse in the left ear.  Recommendations: Follow up with ENT as scheduled. Return for a hearing evaluation if concerns with hearing changes arise or per MD recommendation. Consider a communication needs assessment for amplification after medical clearance is obtained.   Michelle Johnston, AUD  "

## 2024-03-24 ENCOUNTER — Ambulatory Visit: Admitting: Physical Therapy

## 2024-03-24 ENCOUNTER — Encounter: Payer: Self-pay | Admitting: Physical Therapy

## 2024-03-24 DIAGNOSIS — G8929 Other chronic pain: Secondary | ICD-10-CM

## 2024-03-24 DIAGNOSIS — M6281 Muscle weakness (generalized): Secondary | ICD-10-CM

## 2024-03-24 DIAGNOSIS — M5412 Radiculopathy, cervical region: Secondary | ICD-10-CM | POA: Diagnosis not present

## 2024-03-24 DIAGNOSIS — M542 Cervicalgia: Secondary | ICD-10-CM

## 2024-03-24 DIAGNOSIS — H900 Conductive hearing loss, bilateral: Secondary | ICD-10-CM | POA: Insufficient documentation

## 2024-03-24 DIAGNOSIS — R293 Abnormal posture: Secondary | ICD-10-CM

## 2024-03-24 NOTE — Progress Notes (Signed)
 Patient ID: Michelle Johnston, female   DOB: 1950-03-04, 74 y.o.   MRN: [id number]  Follow-up: Left ear eustachian tube dysfunction, left ear ventilating tube, bilateral hearing loss  History of Present Illness Michelle Johnston is a 74 year old female with bilateral conductive hearing loss and a left tympanostomy tube who presents for routine otolaryngology follow-up.  The patient has a history of left ear eustachian tube dysfunction.  Over the past six months, she experienced a single episode of acute sinusitis, which was treated with antibiotics. During this episode, she noted otorrhea from the left ear, which resolved with antibiotic therapy. She did not experience otalgia or other symptoms of otitis media during this period.  She reports no change in hearing since her last evaluation two years ago. She currently denies otalgia and otorrhea.  Exam: General: Communicates without difficulty, well nourished, no acute distress. Head: Normocephalic, no evidence injury, no tenderness, facial buttresses intact without stepoff. Face/sinus: No tenderness to palpation and percussion. Facial movement is normal and symmetric. Eyes: PERRL, EOMI. No scleral icterus, conjunctivae clear. Neuro: CN II exam reveals vision grossly intact.  No nystagmus at any point of gaze. Ears: Auricles well formed without lesions.  Ear canals are intact without mass or lesion.  No erythema or edema is appreciated.  The left ventilating tube is in place and patent.  Nose: External evaluation reveals normal support and skin without lesions.  Dorsum is intact.  Anterior rhinoscopy reveals congested mucosa over anterior aspect of inferior turbinates and intact septum.  No purulence noted. Oral:  Oral cavity and oropharynx are intact, symmetric, without erythema or edema.  Mucosa is moist without lesions. Neck: Full range of motion without pain.  There is no significant lymphadenopathy.  No masses palpable.  Thyroid bed within normal limits to  palpation.  Parotid glands and submandibular glands equal bilaterally without mass.  Trachea is midline. Neuro:  CN 2-12 grossly intact.    Her hearing test shows bilateral conductive hearing loss, slightly worse on the left side. Assessment & Plan Bilateral conductive hearing loss Chronic bilateral conductive hearing loss, worse on the left, with stable audiometric findings compared to two years ago. No new symptoms or progression. - Reviewed audiogram and compared to prior study. - Provided reassurance regarding stable hearing. - Recommended follow-up in six months or sooner if new symptoms arise.  Left eustachian tube dysfunction and left ear tympanostomy tube management Left tympanostomy tube remains well-positioned and functional. Recent episode of otorrhea associated with sinus infection resolved after antibiotics. No current evidence of infection or drainage on examination. - Confirmed tympanostomy tube placement and function on examination. - Assessed for infection or drainage; none present. - Advised to contact office if ear or sinus symptoms develop. - Scheduled follow-up in six months.

## 2024-03-24 NOTE — Therapy (Signed)
 "     OUTPATIENT PHYSICAL THERAPY CERVICAL UPPER EXTREMITY and LUMBAR TREATMENT    Patient Name: Michelle Johnston MRN: [id number] DOB:June 12, 1950, 74 y.o., female Today's Date: 03/24/2024  END OF SESSION:  PT End of Session - 03/24/24 0952     Visit Number 28    Number of Visits 26    Date for Recertification  05/02/24    Authorization Type [employer] Approved 12vl 03/21/24-4/202/2026    Authorization Time Period 03/21/24-4/202/2026    Authorization - Visit Number 2    Authorization - Number of Visits 12    Progress Note Due on Visit 30    PT Start Time 0844    PT Stop Time 0928    PT Time Calculation (min) 44 min    Activity Tolerance Patient tolerated treatment well    Behavior During Therapy [facility] for tasks assessed/performed                 Past Medical History:  Diagnosis Date   Anxiety    Arthritis    Atrial fibrillation (HCC)    Bradycardia    Bronchitis    Depression    Dyspnea    GERD (gastroesophageal reflux disease)    Glaucoma    Hypertension    Presence of Watchman left atrial appendage closure device 08/07/2021   Watchman FLX 27mm with Dr. Cindie   Seasonal allergies    Sleep apnea    does not use CPAP   Wears glasses    Past Surgical History:  Procedure Laterality Date   ABDOMINAL HYSTERECTOMY  1990   ATRIAL FIBRILLATION ABLATION N/A 08/12/2020   Procedure: ATRIAL FIBRILLATION ABLATION;  Surgeon: Cindie Ole DASEN, MD;  Location: [facility];  Service: Cardiovascular;  Laterality: N/A;   ATRIAL FIBRILLATION ABLATION N/A 03/26/2023   Procedure: ATRIAL FIBRILLATION ABLATION;  Surgeon: Cindie Ole DASEN, MD;  Location: [facility];  Service: Cardiovascular;  Laterality: N/A;   BREAST BIOPSY Right    benign   CARDIOVERSION N/A 08/15/2019   Procedure: CARDIOVERSION;  Surgeon: Elmira Newman PARAS, MD;  Location: [facility];  Service: Cardiovascular;  Laterality: N/A;   COLONOSCOPY     LEFT ATRIAL APPENDAGE OCCLUSION N/A 08/07/2021    Procedure: LEFT ATRIAL APPENDAGE OCCLUSION;  Surgeon: Cindie Ole DASEN, MD;  Location: [facility];  Service: Cardiovascular;  Laterality: N/A;   meniscus tear knee     left knee   NASAL SINUS SURGERY  1975   ORIF ANKLE FRACTURE  2009   left   ORIF HUMERUS FRACTURE Right 07/15/2023   Procedure: OPEN REDUCTION INTERNAL FIXATION (ORIF) DISTAL HUMERUS FRACTURE;  Surgeon: Cristy Bonner DASEN, MD;  Location: [facility];  Service: Orthopedics;  Laterality: Right;   REVERSE SHOULDER ARTHROPLASTY Left 09/02/2023   Procedure: ARTHROPLASTY, SHOULDER, TOTAL, REVERSE;  Surgeon: Cristy Bonner DASEN, MD;  Location: [facility];  Service: Orthopedics;  Laterality: Left;   TEE WITHOUT CARDIOVERSION N/A 08/07/2021   Procedure: TRANSESOPHAGEAL ECHOCARDIOGRAM (TEE);  Surgeon: Cindie Ole DASEN, MD;  [facility] INV[facility]IVE CV LAB;  Service: Cardiovascular;  Laterality: N/A;   TOTAL KNEE ARTHROPLASTY Left 02/03/2019   Procedure: LEFT TOTAL KNEE ARTHROPLASTY;  Surgeon: Vernetta Lonni GRADE, MD;  Location: [facility];  Service: Orthopedics;  Laterality: Left;   TURBINATE REDUCTION Bilateral 06/04/2014   Procedure: BILATERAL TURBINATE REDUCTION;  Surgeon: Daniel Moccasin, MD;  Location: [facility];  Service: ENT;  Laterality: Bilateral;   TYMPANOSTOMY TUBE PLACEMENT     left  Patient Active Problem List   Diagnosis Date Noted   Near syncope 09/11/2023   Impacted cerumen of left ear 08/15/2023   Other specified disorders of eustachian tube, left ear 02/06/2023   Central perforation of tympanic membrane of left ear 02/06/2023   Presence of Watchman left atrial appendage closure device 08/07/2021   Atrial fibrillation (HCC) 08/07/2021   OSA (obstructive sleep apnea) 10/29/2020   Persistent atrial fibrillation (HCC) 03/27/2020   Hypercoagulable state due to persistent atrial fibrillation (HCC) 03/27/2020   PAD (peripheral artery disease) 12/27/2019   Exertional chest pain 12/27/2019   Primary osteoarthritis  of right knee 07/12/2019   Paroxysmal atrial fibrillation (HCC) 06/16/2019   Nonrheumatic aortic valve insufficiency 06/16/2019   Nonrheumatic mitral valve regurgitation 06/16/2019   Tachycardia 02/20/2019   Abnormal EKG 02/20/2019   Essential hypertension 02/20/2019   Status post total left knee replacement 02/03/2019   Unilateral primary osteoarthritis, left knee 11/30/2016   Chronic pain of left knee 11/30/2016    PCP:   Loreli Kins, MD    REFERRING PROVIDER: Cristy Bonner DASEN, MD/ Ibazebo, Reliance, MD  REFERRING DIAG: left total shoulder arthoplasty ORIF tuberosities/ lumbar stenosis, cervical radiculopathy - added 03/03/24  THERAPY DIAG:  Radiculopathy, cervical region  Muscle weakness (generalized)  Cervicalgia  Abnormal posture  Chronic left shoulder pain  Rationale for Evaluation and Treatment: Rehabilitation  ONSET DATE: Surgery 09/02/2023  SUBJECTIVE:                                                                                                                                                                                      SUBJECTIVE STATEMENT:  Patient reports she felt good after last session. She had increased neck pain after waiting in a doctor's office yesterday. She just feels some tightness in her neck currently.  POOL ACCESS: GAC - goes to water  aerobics 1-2x/wk  Hand dominance: Right  PERTINENT HISTORY: Anxiety; OA; Depression; HTN; Hx left TKA  PAIN:   PAIN:  Are you having pain? Yes NPRS scale: 3/10 Pain location: Rt elbow, medial forearm, digits 4, 5. Neck. Pain orientation: Right and Medial  PAIN TYPE: sharp and numb, weak Pain description: constant  Aggravating factors: fine motor tasks, sleeping, force through Rt UE like lifting, carrying, pushing Relieving factors: unsure   PRECAUTIONS: Shoulder and Fall see protocol in media  RED FLAGS:  None   WEIGHT BEARING RESTRICTIONS: No  FALLS:  Has patient fallen in last 6 months?  Yes. Number of falls 2 fall. Patient had two surgeries two weeks from each other due to falling. She fell while she was at the grocery store. She was turning and her husband said  her feet did not move with her. PT to address balance   LIVING ENVIRONMENT: Lives with: lives with their spouse Lives in: House/apartment Stairs: Yes: Internal: 12 steps; on left going up Has following equipment at home: Single point cane and Walker - 2 wheeled  OCCUPATION: Retired  PLOF: Independent, Independent with basic ADLs, Independent with household mobility without device, Independent with community mobility without device, Independent with gait, and Independent with transfers  PATIENT GOALS: use of my arm again. To get back to water  aerobics and drive  NEXT MD VISIT:   OBJECTIVE:  Note: Objective measures were completed at Evaluation unless otherwise noted.   PATIENT SURVEYS :  Initial eval QuickDASH Lt : 52.3/100 52.3%  12/15/23: QuickDASH Lt: 34.1/100 34.2%  01/31/24: QuickDASH Lt : 43.2/100 43.2%   12/01/23 ODI: 20/50= 40%  12/15/23 ODI: 21/50= 42%  01/31/24: ODI: 21/50= 42%  02/14/24 ODI 21/50 = 42%  03/03/24: NDI = 21/50, 42% UEFS = 30/80, 37.5%  COGNITION: Overall cognitive status: Within functional limits for tasks assessed     SENSATION: 03/03/24: Pt numb to light touch Rt UE digits 4, 5 and medial aspect of arm  WFL  POSTURE: 03/03/24: Rounded shoulders and forward head, increased cervical lordosis with very short posterior neck posture  Rounded shoulder & forward head Standing posture/ gait: right pelvic shift and left trunk lean  NECK ROM: 03/03/24 Flexion  45 with neck pain - central Extension 50 with neck pain - central Rt SB 20 with pain Lt SB 20 with pain Rt Rot 50 - Rt neck pain Lt Rot 40 - Rt neck pain   UPPER EXTREMITY ROM:   Passive ROM Right eval Left eval Left 11/17/23 Left 1015/25 Left 01/31/24 Left 12/15  Shoulder flexion  90 150 160 160  160  Shoulder extension  NT due to restrictions      Shoulder abduction  75 85 120 125 125  Shoulder adduction        Shoulder internal rotation  NT due to restrictions  70 WNL WNL  Shoulder external rotation  NT due to restrictions 22 60 C7 C7  Elbow flexion        Elbow extension        Wrist flexion        Wrist extension        Wrist ulnar deviation        Wrist radial deviation        Wrist pronation        Wrist supination        (Blank rows = not tested)  11/17/23 seated scaption  5 deg, flex 68 deg  NECK RESISTED ISOMETRICS - WEAK AND PAINFUL  UPPER EXTREMITY MMT:  03/03/24: Rt shoulder: abd 3+/5, ER 3+/5, grip very weak on Rt: Grip Rt: 5lb grip Lt: 15lb  02/14/24: Grip Rt: 5lb grip Lt: 15lb Lt shoulder: 4/5 all muscles except 0/5 ER  NT due to weight restrictions of 1lb  LUMBAR ROM: All WFL  LOWER EXTREMITY MMT: significant proximal hip weakness, quad 4-/5,  DF 4-/5      01/31/24: right hip flexor 4/5   LOWER EXTREMITY ROM:  FUNCTIONAL TESTING Initial eval: 5STS: 17.47 sec no UE support TUG:14.32 sec  12/01/23: 5STS: 19.78 sec no UE support TUG:12.06 sec SLS: severe trendelenburg but able to hold approx 2-3 sec Tandem stance: needs help to get into position but able to hold 2-3 sec   12/15/23 5STS: 21.96 sec no UE support TUG:16.40 sec  with cane  01/31/24 5STS: 21.30 sec with UE support TUG:14.41 sec with cane  JOINT MOBILITY TESTING:  03/03/24 Significant joint restriction throughout c-spine U-joint sideglides, upglides in mid-lower cervical, all glides upper thoracic with more stiffness on Rt than Lt  See PROM measurement above Open end feel  PALPATION: Significant tenderness throughout cervical spine facets and soft tissues, spasm present in Rt upper trap                                                                                                                              TREATMENT DATE:  [facility] Adult PT Treatment:   03/24/24 NuStep  Level 4 5 mins- PT present to discuss status Seated pronation/supination 1# DB x 10 Lt  Seated radial & ulnar deviation 1# DB x 10 Lt  Seated wrist extension stretch x 20 sec bilateral (patient enjoyed these)  Seated shoulder flexion & scaption 1# DB x 10 each direction (Left shoulder was very fatigued with scaption) Seated triceps extension with yellow TB 2 x 10 bilateral  Hooklying alt hand and knee press with purple ball x 10 bilateral  Hooklying chin tuck + scap squeeze x 10 Hooklying chin tuck + serratus punch with 1# DB x 10 bilateral  Supine Bridge x 10  Supine cervical rotation x 10 each direction 5 sec hold Gentle manual neck flexion with chin nod and extension isometric for posterior neck elongation    03/22/24 NuStep Level 4 7 mins- PT present to discuss status Seated pronation/supination 1# DB x 10 Lt  Seated radial & ulnar deviation 1# DB x 10 Lt  Seated shoulder flexion & scaption 1# DB x 10 each direction (very challenging on left, very fatigued) Seated triceps extension with yellow TB 2 x 10 bilateral  Supine pelvic tilts x10, add alt LE march, maintain chin nod Supine Bridge x 10  Supine cervical rotation x 10 each direction 5 sec hold Supine shoulder flexion with dowel x 10  Gentle manual neck flexion with chin nod and extension isometric for posterior neck elongation    03/17/24 UBE L3 2x2 - discussed DN and Pt wants to hold off for now due to financial reasons Supine Chin tuck into pillow x10 5 sec hold Supine cervical rotation isometric x10 5 sec hold Supine cervical sidebend isometric x10 5 sec hold Gentle manual elongation traction along Rt neck in supine Gentle manual neck flexion with chin nod and extension isometric for posterior neck elongation  Supine pelvic tilts x10, add alt LE march, maintain chin nod SNAG seated assisted neck rotation and ext with towel  03/15/2024 NuStep Level 5 5 mins- PT present to discuss status Supine Chin tuck into pillow x10  5 sec hold Supine cervical rotation isometric x10 5 sec hold Supine cervical sidebend isometric x10 5 sec hold Cervical Melt Method (flexion/extension & rotation) x 15 each direction (somewhat uncomfortable)  Supine hooklying bil serratus punch 2lb x20  Manual: cervical distraction (light) and  STM to suboccipitals and upper traps  03/03/24 Assessment of neck and Rt UE (see above) NDI and UEFS (see above) Initiated HEP Seated and supine neck retraction 10x5 holds Seated and supine neck isometrics 5x5 holds Educated Pt about dry needling, traction and other plans for PT to address symptoms before meeting with surgeon     PATIENT EDUCATION: Education details: PT eval findings, anticipated POC, progress with PT, and initial HEP Person educated: Patient Education method: Explanation, Demonstration, and Handouts Education comprehension: verbalized understanding, returned demonstration, and needs further education  HOME EXERCISE PROGRAM: Access Code: [email or web address] URL: [email or web address] Date: 03/03/2024 Prepared by: Orvil Beuhring  Exercises - Supine Shoulder Flexion with Dowel  - 1 x daily - 7 x weekly - 1-2 sets - 10 reps - Seated Scapular Retraction  - 1 x daily - 7 x weekly - 1 sets - 10 reps - Isometric Shoulder Flexion  - 1 x daily - 7 x weekly - 2 sets - 5 reps - 5 hold - Standing Isometric Shoulder Abduction with Doorway - Arm Bent  - 1 x daily - 7 x weekly - 2 sets - 5 reps - 5 hold - Seated Shoulder Flexion AAROM with Pulley Behind  - 1 x daily - 7 x weekly - 2 sets - 10 reps - Seated Shoulder Scaption AAROM with Pulley at Side  - 1 x daily - 7 x weekly - 2 sets - 10 reps - Standing Hip Abduction with Counter Support  - 1 x daily - 7 x weekly - 2 sets - 10 reps - Standing Hip Extension with Leg Bent and Support  - 1 x daily - 7 x weekly - 2 sets - 10 reps - Clam  - 1 x daily - 7 x weekly - 2 sets - 10 reps - Sidelying Hip Abduction  - 1 x daily - 7 x weekly -  2 sets - 10 reps - Supine Cervical Retraction with Towel  - 1 x daily - 7 x weekly - 2 sets - 5 reps - 5 hold - Supine Isometric Neck Sidebend  - 1 x daily - 7 x weekly - 2 sets - 5 reps - 5 hold - Supine Isometric Neck Rotation  - 1 x daily - 7 x weekly - 2 sets - 5 reps - 5 hold  AQUATIC Access Code: [email or web address] URL: [email or web address] Prepared by: [facility] - [facility] - [address] This aquatic home exercise program from MedBridge utilizes pictures from land based exercises, but has been adapted prior to lamination and issuance.   ASSESSMENT:  CLINICAL IMPRESSION:  Ambermarie presents with some neck tightness today after sitting at the doctor's office for a prolonged period. She responded well to manual techniques and demonstrated improved ROM. Educated patient on why left shoulder ROM is not above 100 degrees at times. Patient understood why not having rotator cuff muscles intact impacts shoulder function and mechanics. Provided different exercises patient could perform at home for postural strengthening. She really enjoying forearm stretches. PT monitored patient response throughout and provided cues as needed. Patient will benefit from skilled PT to address the below impairments and improve overall function.      OBJECTIVE IMPAIRMENTS: Abnormal gait, decreased balance, decreased ROM, decreased strength, hypomobility, increased muscle spasms, impaired flexibility, impaired UE functional use, postural dysfunction, and pain.   ACTIVITY LIMITATIONS: carrying, lifting, bending, squatting, stairs, bathing, dressing, reach over head, and hygiene/grooming, gripping  PARTICIPATION LIMITATIONS: meal prep, cleaning, laundry, interpersonal relationship, driving, shopping, and community  activity  PERSONAL FACTORS: Age and 3+ comorbidities: falls risk HTN; anxiety; depression are also affecting patient's functional outcome.   REHAB POTENTIAL: Good  CLINICAL DECISION MAKING:  Evolving/moderate complexity  EVALUATION COMPLEXITY: Moderate  GOALS: Goals reviewed with patient? Yes  SHORT TERM GOALS: Target date: 11/22/2023   Patient will be independent with initial HEP. Baseline:  Goal status: MET  2.  Patient will report > or = to 40% use of Lt UE with light home and self care tasks due to improved ROM and strength. Baseline:  Goal status: partially met  3.  Patient will demonstrate correct seated and standing posture to reduce mechanical strain. Baseline:  Goal status: MET 12/15/23  4.  .  Patient will demonstrate > or = to 100 degrees in Lt  shoulder P/ROM for improved reaching. Baseline:  Goal status: MET 150 deg 11/17/23  5.  Patient to report ability to ambulate for at least 5 min without need to sit.  Baseline:  Goal status: MET 12/15/23  6. Patient to obtain proper assistive device to reduce fall risk  Baseline: not using anything  Goals status: MET 12/15/23    LONG TERM GOALS: Target date: 05/01/24  Patient will demonstrate independence in advanced HEP. Baseline:  Goal status: INITIAL  2.  .  Patient will report > or = to 70% of normal use of Rt UE for ADLs due to improved functional strength and A/ROM. Baseline:  Goal status: INITIAL  3.  Patient will be able to return to return water  aerobics program with no difficulty to establish normal exercise routine Baseline:  Goal status: ONGOING 03/03/24   4.  Patient will be able to wash dishes and fold clothes with < or = to 2/10 left shoulder pain to for improved functional use of extremity. Baseline: washing dishes much improved, folding clothes is painful Goal status:In progress - 03/03/24  5.  Patient will demonstrate > or = to 115 degrees in Rt shoulder A/ROM for improved reaching overhead. Baseline:  Goal status: INITIAL  6.  Patient's Annitta will be 64/100 for improved functional use of Lt UE and decreased disability.  Baseline: 52.3/100 Goal status: ONGOING 03/03/24  7. ODI  to improve to 16/50  Baseline: 20/50  Goal status: ONGOING 03/03/24  8. Patient to be able to stand or walk for at least 15 min without rest  Baseline:   Goal status:ONGOING 03/03/24 9. Patient will achieve Rt grip strength to within 5lb of Lt grip strength  Baseline: 5lb Rt, 15lb Lt  Goal status: NEW 03/03/24 10. Pt will improve UEFS score to at least 60% to demo improved functional use of dominant UE (right)  Baseline: 37.5%  Goal status: new 03/03/24 11. Pt will demo improved neck ROM by at least 10 deg in all planes of motion with min pain to improve reading, driving, cooking.  Baseline: see above on 03/03/24  Goal status: new 03/03/24  PLAN: PT FREQUENCY: 1-2x/week  PT DURATION: 8 weeks  PLANNED INTERVENTIONS: [id number]- PT Re-evaluation, 97110-Therapeutic exercises, 97530- Therapeutic activity, 97112- Neuromuscular re-education, 97535- Self Care, 02859- Manual therapy, [phone]- Gait training, [phone]- Canalith repositioning, [id number]- Aquatic Therapy, [phone]- Electrical stimulation (unattended), [phone]- Electrical stimulation (manual), [id number]- Vasopneumatic device, [id number]- Ultrasound, [id number]- Ionotophoresis 4mg /ml Dexamethasone , 79439 (1-2 muscles), [id number] (3+ muscles)- Dry Needling, Patient/Family education, Balance training, Stair training, Taping, Joint mobilization, Joint manipulation, Spinal manipulation, Spinal mobilization, Scar mobilization, Vestibular training, Cryotherapy, and Moist heat  PLAN FOR NEXT SESSION:  hold DN for now due to financial  reasons, grip and shoulder strength, postural strength, neck stabilization. For Lt shoulder and trunk/hips: Continue to work on strength, balance, fall prevention, left shoulder ROM and strength and pain control.       Kristeen Sar, PT, DPT 03/24/24 9:53 AM [facility] [address] [place], [place] [address] Phone # [phone] Fax [phone]    "

## 2024-03-29 ENCOUNTER — Encounter: Payer: Self-pay | Admitting: Physical Therapy

## 2024-03-29 ENCOUNTER — Ambulatory Visit: Admitting: Physical Therapy

## 2024-03-29 DIAGNOSIS — M5412 Radiculopathy, cervical region: Secondary | ICD-10-CM

## 2024-03-29 DIAGNOSIS — M542 Cervicalgia: Secondary | ICD-10-CM

## 2024-03-29 DIAGNOSIS — R293 Abnormal posture: Secondary | ICD-10-CM

## 2024-03-29 DIAGNOSIS — G8929 Other chronic pain: Secondary | ICD-10-CM

## 2024-03-29 DIAGNOSIS — M6281 Muscle weakness (generalized): Secondary | ICD-10-CM

## 2024-03-29 NOTE — Therapy (Signed)
 "     OUTPATIENT PHYSICAL THERAPY CERVICAL UPPER EXTREMITY and LUMBAR TREATMENT    Patient Name: Michelle Johnston MRN: [id number] DOB:1950-05-14, 74 y.o., female Today's Date: 03/29/2024  END OF SESSION:  PT End of Session - 03/29/24 1001     Visit Number 29    Date for Recertification  05/02/24    Authorization Type [employer] Approved 12vl 03/21/24-4/202/2026    Authorization Time Period 03/21/24-4/202/2026    Authorization - Visit Number 3    Authorization - Number of Visits 12    Progress Note Due on Visit 30    PT Start Time 0847    PT Stop Time 0931    PT Time Calculation (min) 44 min    Activity Tolerance Patient tolerated treatment well    Behavior During Therapy [facility] for tasks assessed/performed                  Past Medical History:  Diagnosis Date   Anxiety    Arthritis    Atrial fibrillation (HCC)    Bradycardia    Bronchitis    Depression    Dyspnea    GERD (gastroesophageal reflux disease)    Glaucoma    Hypertension    Presence of Watchman left atrial appendage closure device 08/07/2021   Watchman FLX 27mm with Dr. Cindie   Seasonal allergies    Sleep apnea    does not use CPAP   Wears glasses    Past Surgical History:  Procedure Laterality Date   ABDOMINAL HYSTERECTOMY  1990   ATRIAL FIBRILLATION ABLATION N/A 08/12/2020   Procedure: ATRIAL FIBRILLATION ABLATION;  Surgeon: Cindie Ole DASEN, MD;  Location: [facility];  Service: Cardiovascular;  Laterality: N/A;   ATRIAL FIBRILLATION ABLATION N/A 03/26/2023   Procedure: ATRIAL FIBRILLATION ABLATION;  Surgeon: Cindie Ole DASEN, MD;  Location: [facility];  Service: Cardiovascular;  Laterality: N/A;   BREAST BIOPSY Right    benign   CARDIOVERSION N/A 08/15/2019   Procedure: CARDIOVERSION;  Surgeon: Elmira Newman PARAS, MD;  Location: [facility];  Service: Cardiovascular;  Laterality: N/A;   COLONOSCOPY     LEFT ATRIAL APPENDAGE OCCLUSION N/A 08/07/2021   Procedure: LEFT ATRIAL  APPENDAGE OCCLUSION;  Surgeon: Cindie Ole DASEN, MD;  Location: [facility];  Service: Cardiovascular;  Laterality: N/A;   meniscus tear knee     left knee   NASAL SINUS SURGERY  1975   ORIF ANKLE FRACTURE  2009   left   ORIF HUMERUS FRACTURE Right 07/15/2023   Procedure: OPEN REDUCTION INTERNAL FIXATION (ORIF) DISTAL HUMERUS FRACTURE;  Surgeon: Cristy Bonner DASEN, MD;  Location: [facility];  Service: Orthopedics;  Laterality: Right;   REVERSE SHOULDER ARTHROPLASTY Left 09/02/2023   Procedure: ARTHROPLASTY, SHOULDER, TOTAL, REVERSE;  Surgeon: Cristy Bonner DASEN, MD;  Location: [facility];  Service: Orthopedics;  Laterality: Left;   TEE WITHOUT CARDIOVERSION N/A 08/07/2021   Procedure: TRANSESOPHAGEAL ECHOCARDIOGRAM (TEE);  Surgeon: Cindie Ole DASEN, MD;  Location: [facility];  Service: Cardiovascular;  Laterality: N/A;   TOTAL KNEE ARTHROPLASTY Left 02/03/2019   Procedure: LEFT TOTAL KNEE ARTHROPLASTY;  Surgeon: Vernetta Lonni GRADE, MD;  Location: [facility];  Service: Orthopedics;  Laterality: Left;   TURBINATE REDUCTION Bilateral 06/04/2014   Procedure: BILATERAL TURBINATE REDUCTION;  Surgeon: Daniel Moccasin, MD;  Location: [facility];  Service: ENT;  Laterality: Bilateral;   TYMPANOSTOMY TUBE PLACEMENT     left   Patient Active Problem List  Diagnosis Date Noted   Conductive hearing loss, bilateral 03/24/2024   Near syncope 09/11/2023   Impacted cerumen of left ear 08/15/2023   Other specified disorders of eustachian tube, left ear 02/06/2023   Central perforation of tympanic membrane of left ear 02/06/2023   Presence of Watchman left atrial appendage closure device 08/07/2021   Atrial fibrillation (HCC) 08/07/2021   OSA (obstructive sleep apnea) 10/29/2020   Persistent atrial fibrillation (HCC) 03/27/2020   Hypercoagulable state due to persistent atrial fibrillation (HCC) 03/27/2020   PAD (peripheral artery disease) 12/27/2019   Exertional chest pain 12/27/2019    Primary osteoarthritis of right knee 07/12/2019   Paroxysmal atrial fibrillation (HCC) 06/16/2019   Nonrheumatic aortic valve insufficiency 06/16/2019   Nonrheumatic mitral valve regurgitation 06/16/2019   Tachycardia 02/20/2019   Abnormal EKG 02/20/2019   Essential hypertension 02/20/2019   Status post total left knee replacement 02/03/2019   Unilateral primary osteoarthritis, left knee 11/30/2016   Chronic pain of left knee 11/30/2016    PCP:   Loreli Kins, MD    REFERRING PROVIDER: Cristy Bonner DASEN, MD/ [place]baz[place], [place], MD  REFERRING DIAG: left total shoulder arthoplasty ORIF tuberosities/ lumbar stenosis, cervical radiculopathy - added 03/03/24  THERAPY DIAG:  Radiculopathy, cervical region  Muscle weakness (generalized)  Cervicalgia  Abnormal posture  Chronic left shoulder pain  Rationale for Evaluation and Treatment: Rehabilitation  ONSET DATE: Surgery 09/02/2023  SUBJECTIVE:                                                                                                                                                                                      SUBJECTIVE STATEMENT:  Patient reports she is doing okay today. She is having a little pain in her elbow currently.  POOL ACCESS: GAC - goes to water  aerobics 1-2x/wk  Hand dominance: Right  PERTINENT HISTORY: Anxiety; OA; Depression; HTN; Hx left TKA  PAIN:   PAIN:  Are you having pain? Yes NPRS scale: 3/10 Pain location: Rt elbow, medial forearm, digits 4, 5. Neck. Pain orientation: Right and Medial  PAIN TYPE: sharp and numb, weak Pain description: constant  Aggravating factors: fine motor tasks, sleeping, force through Rt UE like lifting, carrying, pushing Relieving factors: unsure   PRECAUTIONS: Shoulder and Fall see protocol in media  RED FLAGS:  None   WEIGHT BEARING RESTRICTIONS: No  FALLS:  Has patient fallen in last 6 months? Yes. Number of falls 2 fall. Patient had two surgeries two  weeks from each other due to falling. She fell while she was at the grocery store. She was turning and her husband said her feet did not move with her. PT to address balance  LIVING ENVIRONMENT: Lives with: lives with their spouse Lives in: House/apartment Stairs: Yes: Internal: 12 steps; on left going up Has following equipment at home: Single point cane and Walker - 2 wheeled  OCCUPATION: Retired  PLOF: Independent, Independent with basic ADLs, Independent with household mobility without device, Independent with community mobility without device, Independent with gait, and Independent with transfers  PATIENT GOALS: use of my arm again. To get back to water  aerobics and drive  NEXT MD VISIT:   OBJECTIVE:  Note: Objective measures were completed at Evaluation unless otherwise noted.   PATIENT SURVEYS :  Initial eval QuickDASH Lt : 52.3/100 52.3%  12/15/23: QuickDASH Lt: 34.1/100 34.2%  01/31/24: QuickDASH Lt : 43.2/100 43.2%   12/01/23 ODI: 20/50= 40%  12/15/23 ODI: 21/50= 42%  01/31/24: ODI: 21/50= 42%  02/14/24 ODI 21/50 = 42%  03/03/24: NDI = 21/50, 42% UEFS = 30/80, 37.5%  COGNITION: Overall cognitive status: Within functional limits for tasks assessed     SENSATION: 03/03/24: Pt numb to light touch Rt UE digits 4, 5 and medial aspect of arm  WFL  POSTURE: 03/03/24: Rounded shoulders and forward head, increased cervical lordosis with very short posterior neck posture  Rounded shoulder & forward head Standing posture/ gait: right pelvic shift and left trunk lean  NECK ROM: 03/03/24 Flexion  45 with neck pain - central Extension 50 with neck pain - central Rt SB 20 with pain Lt SB 20 with pain Rt Rot 50 - Rt neck pain Lt Rot 40 - Rt neck pain   UPPER EXTREMITY ROM:   Passive ROM Right eval Left eval Left 11/17/23 Left 1015/25 Left 01/31/24 Left 12/15  Shoulder flexion  90 150 160 160 160  Shoulder extension  NT due to restrictions       Shoulder abduction  75 85 120 125 125  Shoulder adduction        Shoulder internal rotation  NT due to restrictions  70 WNL WNL  Shoulder external rotation  NT due to restrictions 22 60 C7 C7  Elbow flexion        Elbow extension        Wrist flexion        Wrist extension        Wrist ulnar deviation        Wrist radial deviation        Wrist pronation        Wrist supination        (Blank rows = not tested)  11/17/23 seated scaption  5 deg, flex 68 deg  NECK RESISTED ISOMETRICS - WEAK AND PAINFUL  UPPER EXTREMITY MMT:  03/03/24: Rt shoulder: abd 3+/5, ER 3+/5, grip very weak on Rt: Grip Rt: 5lb grip Lt: 15lb  02/14/24: Grip Rt: 5lb grip Lt: 15lb Lt shoulder: 4/5 all muscles except 0/5 ER  NT due to weight restrictions of 1lb  LUMBAR ROM: All WFL  LOWER EXTREMITY MMT: significant proximal hip weakness, quad 4-/5,  DF 4-/5      01/31/24: right hip flexor 4/5   LOWER EXTREMITY ROM:  FUNCTIONAL TESTING Initial eval: 5STS: 17.47 sec no UE support TUG:14.32 sec  12/01/23: 5STS: 19.78 sec no UE support TUG:12.06 sec SLS: severe trendelenburg but able to hold approx 2-3 sec Tandem stance: needs help to get into position but able to hold 2-3 sec   12/15/23 5STS: 21.96 sec no UE support TUG:16.40 sec with cane  01/31/24 5STS: 21.30 sec with UE support TUG:14.41 sec with  cane  JOINT MOBILITY TESTING:  03/03/24 Significant joint restriction throughout c-spine U-joint sideglides, upglides in mid-lower cervical, all glides upper thoracic with more stiffness on Rt than Lt  See PROM measurement above Open end feel  PALPATION: Significant tenderness throughout cervical spine facets and soft tissues, spasm present in Rt upper trap                                                                                                                              TREATMENT DATE:  [facility] Adult PT Treatment:   03/29/24 NuStep Level 4 4 mins- PT present to discuss status Seated  wrist extension stretch 3 x 15  sec bilateral  Seated praying hands stretch 3 x 15 sec Red bar twisting then U and upside down U)x 12  Seated shoulder flexion & scaption 1# DB x 10 each  Seated chin tuck + scap squeeze x 10 Sit to stand with 5# DB x 5 Seated hip abduction with red loop x 20 Supine chin tuck + shoulder flexion x 10 Supine cervical rotation x 10 5 sec hold Gentle manual neck flexion with chin nod and extension isometric for posterior neck elongation    03/24/24 NuStep Level 4 5 mins- PT present to discuss status Seated pronation/supination 1# DB x 10 Lt  Seated radial & ulnar deviation 1# DB x 10 Lt  Seated wrist extension stretch x 20 sec bilateral (patient enjoyed these)  Seated shoulder flexion & scaption 1# DB x 10 each direction (Left shoulder was very fatigued with scaption) Seated triceps extension with yellow TB 2 x 10 bilateral  Hooklying alt hand and knee press with purple ball x 10 bilateral  Hooklying chin tuck + scap squeeze x 10 Hooklying chin tuck + serratus punch with 1# DB x 10 bilateral  Supine Bridge x 10  Supine cervical rotation x 10 each direction 5 sec hold Gentle manual neck flexion with chin nod and extension isometric for posterior neck elongation    03/22/24 NuStep Level 4 7 mins- PT present to discuss status Seated pronation/supination 1# DB x 10 Lt  Seated radial & ulnar deviation 1# DB x 10 Lt  Seated shoulder flexion & scaption 1# DB x 10 each direction (very challenging on left, very fatigued) Seated triceps extension with yellow TB 2 x 10 bilateral  Supine pelvic tilts x10, add alt LE march, maintain chin nod Supine Bridge x 10  Supine cervical rotation x 10 each direction 5 sec hold Supine shoulder flexion with dowel x 10  Gentle manual neck flexion with chin nod and extension isometric for posterior neck elongation    03/17/24 UBE L3 2x2 - discussed DN and Pt wants to hold off for now due to financial reasons Supine Chin  tuck into pillow x10 5 sec hold Supine cervical rotation isometric x10 5 sec hold Supine cervical sidebend isometric x10 5 sec hold Gentle manual elongation traction along Rt neck in supine Gentle manual  neck flexion with chin nod and extension isometric for posterior neck elongation  Supine pelvic tilts x10, add alt LE march, maintain chin nod SNAG seated assisted neck rotation and ext with towel    PATIENT EDUCATION: Education details: PT eval findings, anticipated POC, progress with PT, and initial HEP Person educated: Patient Education method: Explanation, Demonstration, and Handouts Education comprehension: verbalized understanding, returned demonstration, and needs further education  HOME EXERCISE PROGRAM: Access Code: [email or web address] URL: [email or web address] Date: 03/29/2024 Prepared by: Kristeen Sar  Exercises - Supine Shoulder Flexion with Dowel  - 1 x daily - 7 x weekly - 1-2 sets - 10 reps - Seated Scapular Retraction  - 1 x daily - 7 x weekly - 1 sets - 10 reps - Isometric Shoulder Flexion  - 1 x daily - 7 x weekly - 2 sets - 5 reps - 5 hold - Standing Isometric Shoulder Abduction with Doorway - Arm Bent  - 1 x daily - 7 x weekly - 2 sets - 5 reps - 5 hold - Seated Shoulder Flexion AAROM with Pulley Behind  - 1 x daily - 7 x weekly - 2 sets - 10 reps - Seated Shoulder Scaption AAROM with Pulley at Side  - 1 x daily - 7 x weekly - 2 sets - 10 reps - Standing Hip Abduction with Counter Support  - 1 x daily - 7 x weekly - 2 sets - 10 reps - Standing Hip Extension with Leg Bent and Support  - 1 x daily - 7 x weekly - 2 sets - 10 reps - Clam  - 1 x daily - 7 x weekly - 2 sets - 10 reps - Sidelying Hip Abduction  - 1 x daily - 7 x weekly - 2 sets - 10 reps - Supine Cervical Retraction with Towel  - 1 x daily - 7 x weekly - 2 sets - 5 reps - 5 hold - Supine Isometric Neck Sidebend  - 1 x daily - 7 x weekly - 2 sets - 5 reps - 5 hold - Supine Isometric Neck Rotation  -  1 x daily - 7 x weekly - 2 sets - 5 reps - 5 hold - Seated Hip Abduction with Resistance  - 1 x daily - 7 x weekly - 2 sets - 10 reps - Seated March with Resistance  - 1 x daily - 7 x weekly - 1 sets - 10 reps AQUATIC Access Code: [email or web address] URL: [email or web address] Prepared by: [facility] [address] This aquatic home exercise program from MedBridge utilizes pictures from land based exercises, but has been adapted prior to lamination and issuance.   ASSESSMENT:  CLINICAL IMPRESSION:  Avenly presents with some elbow discomfort today. We incorporated some strengthening with the flex bar today. Patient enjoyed theses and verbalized feeling muscle working. Since starting therapy she reports improved grip strength and less tingling in her 4th digit. She still reports constant tinging in her  5th digit. Today we progressed functional strengthening. She was challenged with sit to stand with weight. Noted knee valgus when performing. Overall, patient is progressing well with skilled therapy. Patient will benefit from skilled PT to address the below impairments and improve overall function.     OBJECTIVE IMPAIRMENTS: Abnormal gait, decreased balance, decreased ROM, decreased strength, hypomobility, increased muscle spasms, impaired flexibility, impaired UE functional use, postural dysfunction, and pain.   ACTIVITY LIMITATIONS: carrying, lifting, bending, squatting, stairs, bathing, dressing, reach over head, and hygiene/grooming, gripping  PARTICIPATION  LIMITATIONS: meal prep, cleaning, laundry, interpersonal relationship, driving, shopping, and community activity  PERSONAL FACTORS: Age and 3+ comorbidities: falls risk HTN; anxiety; depression are also affecting patient's functional outcome.   REHAB POTENTIAL: Good  CLINICAL DECISION MAKING: Evolving/moderate complexity  EVALUATION COMPLEXITY: Moderate  GOALS: Goals reviewed with patient? Yes  SHORT TERM  GOALS: Target date: 11/22/2023   Patient will be independent with initial HEP. Baseline:  Goal status: MET  2.  Patient will report > or = to 40% use of Lt UE with light home and self care tasks due to improved ROM and strength. Baseline:  Goal status: partially met  3.  Patient will demonstrate correct seated and standing posture to reduce mechanical strain. Baseline:  Goal status: MET 12/15/23  4.  .  Patient will demonstrate > or = to 100 degrees in Lt  shoulder P/ROM for improved reaching. Baseline:  Goal status: MET 150 deg 11/17/23  5.  Patient to report ability to ambulate for at least 5 min without need to sit.  Baseline:  Goal status: MET 12/15/23  6. Patient to obtain proper assistive device to reduce fall risk  Baseline: not using anything  Goals status: MET 12/15/23    LONG TERM GOALS: Target date: 05/01/24  Patient will demonstrate independence in advanced HEP. Baseline:  Goal status: INITIAL  2.  .  Patient will report > or = to 70% of normal use of Rt UE for ADLs due to improved functional strength and A/ROM. Baseline:  Goal status: INITIAL  3.  Patient will be able to return to return water  aerobics program with no difficulty to establish normal exercise routine Baseline:  Goal status: ONGOING 03/03/24   4.  Patient will be able to wash dishes and fold clothes with < or = to 2/10 left shoulder pain to for improved functional use of extremity. Baseline: washing dishes much improved, folding clothes is painful Goal status:In progress - 03/03/24  5.  Patient will demonstrate > or = to 115 degrees in Rt shoulder A/ROM for improved reaching overhead. Baseline:  Goal status: INITIAL  6.  Patient's Annitta will be 64/100 for improved functional use of Lt UE and decreased disability.  Baseline: 52.3/100 Goal status: ONGOING 03/03/24  7. ODI to improve to 16/50  Baseline: 20/50  Goal status: ONGOING 03/03/24  8. Patient to be able to stand or walk for at least  15 min without rest  Baseline:   Goal status:ONGOING 03/03/24 9. Patient will achieve Rt grip strength to within 5lb of Lt grip strength  Baseline: 5lb Rt, 15lb Lt  Goal status: NEW 03/03/24 10. Pt will improve UEFS score to at least 60% to demo improved functional use of dominant UE (right)  Baseline: 37.5%  Goal status: new 03/03/24 11. Pt will demo improved neck ROM by at least 10 deg in all planes of motion with min pain to improve reading, driving, cooking.  Baseline: see above on 03/03/24  Goal status: new 03/03/24  PLAN: PT FREQUENCY: 1-2x/week  PT DURATION: 8 weeks  PLANNED INTERVENTIONS: 97164- PT Re-evaluation, 97110-Therapeutic exercises, 97530- Therapeutic activity, 97112- Neuromuscular re-education, 97535- Self Care, 02859- Manual therapy, [phone]- Gait training, [phone]- Canalith repositioning, [id number]- Aquatic Therapy, [phone]- Electrical stimulation (unattended), [phone]- Electrical stimulation (manual), [id number]- Vasopneumatic device, [id number]- Ultrasound, [id number]- Ionotophoresis 4mg /ml Dexamethasone , 79439 (1-2 muscles), 20561 (3+ muscles)- Dry Needling, Patient/Family education, Balance training, Stair training, Taping, Joint mobilization, Joint manipulation, Spinal manipulation, Spinal mobilization, Scar mobilization, Vestibular training, Cryotherapy, and Moist heat  PLAN  FOR NEXT SESSION: 30th visit PN; assess updated HEP; hold DN for now due to financial reasons, grip and shoulder strength, postural strength, neck stabilization. For Lt shoulder and trunk/hips: Continue to work on strength, balance, fall prevention, left shoulder ROM and strength and pain control.       Kristeen Sar, PT, DPT 03/29/24 10:05 AM [facility] [address] [place], [place] [address] Phone # [phone] Fax [phone]    "

## 2024-03-31 ENCOUNTER — Ambulatory Visit: Admitting: Physical Therapy

## 2024-03-31 ENCOUNTER — Ambulatory Visit (HOSPITAL_COMMUNITY)
Admission: RE | Admit: 2024-03-31 | Discharge: 2024-03-31 | Disposition: A | Source: Ambulatory Visit | Attending: Cardiology

## 2024-03-31 ENCOUNTER — Encounter: Payer: Self-pay | Admitting: Physical Therapy

## 2024-03-31 DIAGNOSIS — M5412 Radiculopathy, cervical region: Secondary | ICD-10-CM | POA: Diagnosis not present

## 2024-03-31 DIAGNOSIS — M6281 Muscle weakness (generalized): Secondary | ICD-10-CM

## 2024-03-31 DIAGNOSIS — I739 Peripheral vascular disease, unspecified: Secondary | ICD-10-CM | POA: Diagnosis present

## 2024-03-31 DIAGNOSIS — M542 Cervicalgia: Secondary | ICD-10-CM

## 2024-03-31 DIAGNOSIS — R293 Abnormal posture: Secondary | ICD-10-CM

## 2024-03-31 DIAGNOSIS — G8929 Other chronic pain: Secondary | ICD-10-CM

## 2024-03-31 LAB — VAS US ABI WITH/WO TBI
Left ABI: 1.03
Right ABI: 1.09

## 2024-04-05 ENCOUNTER — Ambulatory Visit

## 2024-04-05 DIAGNOSIS — G8929 Other chronic pain: Secondary | ICD-10-CM

## 2024-04-05 DIAGNOSIS — M6281 Muscle weakness (generalized): Secondary | ICD-10-CM

## 2024-04-05 DIAGNOSIS — R252 Cramp and spasm: Secondary | ICD-10-CM

## 2024-04-05 DIAGNOSIS — R293 Abnormal posture: Secondary | ICD-10-CM

## 2024-04-05 DIAGNOSIS — R2681 Unsteadiness on feet: Secondary | ICD-10-CM

## 2024-04-05 DIAGNOSIS — M47816 Spondylosis without myelopathy or radiculopathy, lumbar region: Secondary | ICD-10-CM

## 2024-04-05 DIAGNOSIS — M5459 Other low back pain: Secondary | ICD-10-CM

## 2024-04-05 DIAGNOSIS — M25521 Pain in right elbow: Secondary | ICD-10-CM

## 2024-04-05 DIAGNOSIS — M542 Cervicalgia: Secondary | ICD-10-CM

## 2024-04-05 DIAGNOSIS — M25611 Stiffness of right shoulder, not elsewhere classified: Secondary | ICD-10-CM

## 2024-04-05 DIAGNOSIS — M5412 Radiculopathy, cervical region: Secondary | ICD-10-CM

## 2024-04-05 NOTE — Therapy (Signed)
 "     OUTPATIENT PHYSICAL THERAPY CERVICAL UPPER EXTREMITY and LUMBAR TREATMENT/ PROGRESS NOTE     Patient Name: Michelle Johnston MRN: [id number] DOB:11-23-1950, 74 y.o., female Today's Date: 04/05/2024  END OF SESSION:  PT End of Session - 04/05/24 0941     Visit Number 31    Number of Visits 38    Date for Recertification  05/02/24    Authorization Type [employer] Approved 12vl 03/21/24-4/202/2026    Authorization Time Period 03/21/24-4/202/2026    Authorization - Visit Number 5    Authorization - Number of Visits 12    Progress Note Due on Visit 40    PT Start Time 0942    PT Stop Time 1015    PT Time Calculation (min) 33 min    Activity Tolerance Patient tolerated treatment well    Behavior During Therapy [facility] for tasks assessed/performed                   Past Medical History:  Diagnosis Date   Anxiety    Arthritis    Atrial fibrillation (HCC)    Bradycardia    Bronchitis    Depression    Dyspnea    GERD (gastroesophageal reflux disease)    Glaucoma    Hypertension    Presence of Watchman left atrial appendage closure device 08/07/2021   Watchman FLX 27mm with Dr. Cindie   Seasonal allergies    Sleep apnea    does not use CPAP   Wears glasses    Past Surgical History:  Procedure Laterality Date   ABDOMINAL HYSTERECTOMY  1990   ATRIAL FIBRILLATION ABLATION N/A 08/12/2020   Procedure: ATRIAL FIBRILLATION ABLATION;  Surgeon: Cindie Ole DASEN, MD;  Location: [facility];  Service: Cardiovascular;  Laterality: N/A;   ATRIAL FIBRILLATION ABLATION N/A 03/26/2023   Procedure: ATRIAL FIBRILLATION ABLATION;  Surgeon: Cindie Ole DASEN, MD;  Location: [facility];  Service: Cardiovascular;  Laterality: N/A;   BREAST BIOPSY Right    benign   CARDIOVERSION N/A 08/15/2019   Procedure: CARDIOVERSION;  Surgeon: Elmira Newman PARAS, MD;  Location: [facility];  Service: Cardiovascular;  Laterality: N/A;   COLONOSCOPY     LEFT ATRIAL APPENDAGE OCCLUSION  N/A 08/07/2021   Procedure: LEFT ATRIAL APPENDAGE OCCLUSION;  Surgeon: Cindie Ole DASEN, MD;  Location: [facility];  Service: Cardiovascular;  Laterality: N/A;   meniscus tear knee     left knee   NASAL SINUS SURGERY  1975   ORIF ANKLE FRACTURE  2009   left   ORIF HUMERUS FRACTURE Right 07/15/2023   Procedure: OPEN REDUCTION INTERNAL FIXATION (ORIF) DISTAL HUMERUS FRACTURE;  Surgeon: Cristy Bonner DASEN, MD;  Location: [facility];  Service: Orthopedics;  Laterality: Right;   REVERSE SHOULDER ARTHROPLASTY Left 09/02/2023   Procedure: ARTHROPLASTY, SHOULDER, TOTAL, REVERSE;  Surgeon: Cristy Bonner DASEN, MD;  Location: [facility];  Service: Orthopedics;  Laterality: Left;   TEE WITHOUT CARDIOVERSION N/A 08/07/2021   Procedure: TRANSESOPHAGEAL ECHOCARDIOGRAM (TEE);  Surgeon: Cindie Ole DASEN, MD;  Location: [facility];  Service: Cardiovascular;  Laterality: N/A;   TOTAL KNEE ARTHROPLASTY Left 02/03/2019   Procedure: LEFT TOTAL KNEE ARTHROPLASTY;  Surgeon: Vernetta Lonni GRADE, MD;  Location: [facility];  Service: Orthopedics;  Laterality: Left;   TURBINATE REDUCTION Bilateral 06/04/2014   Procedure: BILATERAL TURBINATE REDUCTION;  Surgeon: Daniel Moccasin, MD;  Location: [facility];  Service: ENT;  Laterality: Bilateral;   TYMPANOSTOMY TUBE PLACEMENT  left   Patient Active Problem List   Diagnosis Date Noted   Conductive hearing loss, bilateral 03/24/2024   Near syncope 09/11/2023   Impacted cerumen of left ear 08/15/2023   Other specified disorders of eustachian tube, left ear 02/06/2023   Central perforation of tympanic membrane of left ear 02/06/2023   Presence of Watchman left atrial appendage closure device 08/07/2021   Atrial fibrillation (HCC) 08/07/2021   OSA (obstructive sleep apnea) 10/29/2020   Persistent atrial fibrillation (HCC) 03/27/2020   Hypercoagulable state due to persistent atrial fibrillation (HCC) 03/27/2020   PAD (peripheral artery disease)  12/27/2019   Exertional chest pain 12/27/2019   Primary osteoarthritis of right knee 07/12/2019   Paroxysmal atrial fibrillation (HCC) 06/16/2019   Nonrheumatic aortic valve insufficiency 06/16/2019   Nonrheumatic mitral valve regurgitation 06/16/2019   Tachycardia 02/20/2019   Abnormal EKG 02/20/2019   Essential hypertension 02/20/2019   Status post total left knee replacement 02/03/2019   Unilateral primary osteoarthritis, left knee 11/30/2016   Chronic pain of left knee 11/30/2016    PCP:   Loreli Kins, MD    REFERRING PROVIDER: Cristy Bonner DASEN, MD/ Margeret Kotyk, MD  REFERRING DIAG: left total shoulder arthoplasty ORIF tuberosities/ lumbar stenosis, cervical radiculopathy - added 03/03/24  THERAPY DIAG:  Radiculopathy, cervical region  Muscle weakness (generalized)  Cervicalgia  Abnormal posture  Chronic left shoulder pain  Other low back pain  Unsteadiness on feet  Lumbar spondylosis  Pain in right elbow  Stiffness of right shoulder, not elsewhere classified  Cramp and spasm  Rationale for Evaluation and Treatment: Rehabilitation  ONSET DATE: Surgery 09/02/2023  SUBJECTIVE:                                                                                                                                                                                      SUBJECTIVE STATEMENT:  Patient reports she is doing ok today.   Pain reported at 3/10.  POOL ACCESS: GAC - goes to water  aerobics 1-2x/wk  Hand dominance: Right  PERTINENT HISTORY: Anxiety; OA; Depression; HTN; Hx left TKA  PAIN:   PAIN:  04/05/24 Are you having pain? Yes NPRS scale: 3/10 Pain location: Rt elbow, medial forearm, digits 4, 5. Neck. Pain orientation: Right and Medial  PAIN TYPE: sharp and numb, weak Pain description: constant  Aggravating factors: fine motor tasks, sleeping, force through Rt UE like lifting, carrying, pushing Relieving factors: unsure   PRECAUTIONS: Shoulder  and Fall see protocol in media  RED FLAGS:  None   WEIGHT BEARING RESTRICTIONS: No  FALLS:  Has patient fallen in last 6 months? Yes. Number of falls 2 fall. Patient had two surgeries  two weeks from each other due to falling. She fell while she was at the grocery store. She was turning and her husband said her feet did not move with her. PT to address balance   LIVING ENVIRONMENT: Lives with: lives with their spouse Lives in: House/apartment Stairs: Yes: Internal: 12 steps; on left going up Has following equipment at home: Single point cane and Walker - 2 wheeled  OCCUPATION: Retired  PLOF: Independent, Independent with basic ADLs, Independent with household mobility without device, Independent with community mobility without device, Independent with gait, and Independent with transfers  PATIENT GOALS: use of my arm again. To get back to water  aerobics and drive  NEXT MD VISIT:   OBJECTIVE:  Note: Objective measures were completed at Evaluation unless otherwise noted.   PATIENT SURVEYS :  Initial eval QuickDASH Lt : 52.3/100 52.3%  12/15/23: QuickDASH Lt: 34.1/100 34.2%  01/31/24: QuickDASH Lt : 43.2/100 43.2%   12/01/23 ODI: 20/50= 40%  12/15/23 ODI: 21/50= 42%  01/31/24: ODI: 21/50= 42%  02/14/24 ODI 21/50 = 42%  03/03/24: NDI = 21/50, 42% UEFS = 30/80, 37.5%  03/31/2024 ODI 17/50 34% NDI: 11/50 22%  COGNITION: Overall cognitive status: Within functional limits for tasks assessed     SENSATION: 03/03/24: Pt numb to light touch Rt UE digits 4, 5 and medial aspect of arm  WFL  POSTURE: 03/03/24: Rounded shoulders and forward head, increased cervical lordosis with very short posterior neck posture  Rounded shoulder & forward head Standing posture/ gait: right pelvic shift and left trunk lean  NECK ROM: 03/03/24 Flexion  45 with neck pain - central Extension 50 with neck pain - central Rt SB 20 with pain Lt SB 20 with pain Rt Rot 50 - Rt neck  pain Lt Rot 40 - Rt neck pain  03/31/2024 Flexion  35 no pain Extension 35 no pain Rt SB 15 no pain Lt SB 23 no pain Rt Rot   - 70 Lt Rot - 70  UPPER EXTREMITY ROM:   Passive ROM Right eval Left eval Left 11/17/23 Left 1015/25 Left 01/31/24 Left 12/15  Shoulder flexion  90 150 160 160 160  Shoulder extension  NT due to restrictions      Shoulder abduction  75 85 120 125 125  Shoulder adduction        Shoulder internal rotation  NT due to restrictions  70 WNL WNL  Shoulder external rotation  NT due to restrictions 22 60 C7 C7  Elbow flexion        Elbow extension        Wrist flexion        Wrist extension        Wrist ulnar deviation        Wrist radial deviation        Wrist pronation        Wrist supination        (Blank rows = not tested)  11/17/23 seated scaption  5 deg, flex 68 deg  NECK RESISTED ISOMETRICS - WEAK AND PAINFUL  UPPER EXTREMITY MMT:  03/31/2024 Rt : abd: 4+/5 ER:3+/5  Grip:7lb Lt : grip 15lb  03/03/24: Rt shoulder: abd 3+/5, ER 3+/5, grip very weak on Rt: Grip Rt: 5lb grip Lt: 15lb  02/14/24: Grip Rt: 5lb grip Lt: 15lb Lt shoulder: 4/5 all muscles except 0/5 ER  NT due to weight restrictions of 1lb  LUMBAR ROM: All WFL  LOWER EXTREMITY MMT: significant proximal hip weakness, quad 4-/5,  DF 4-/5      01/31/24: right hip flexor 4/5   LOWER EXTREMITY ROM:  FUNCTIONAL TESTING Initial eval: 5STS: 17.47 sec no UE support TUG:14.32 sec  12/01/23: 5STS: 19.78 sec no UE support TUG:12.06 sec SLS: severe trendelenburg but able to hold approx 2-3 sec Tandem stance: needs help to get into position but able to hold 2-3 sec   12/15/23 5STS: 21.96 sec no UE support TUG:16.40 sec with cane  01/31/24 5STS: 21.30 sec with UE support TUG:14.41 sec with cane  03/31/2024 5STS: 17.42 sec with UE support TUG: 11.32 sec no AD device  JOINT MOBILITY TESTING:  03/03/24 Significant joint restriction throughout c-spine U-joint sideglides, upglides  in mid-lower cervical, all glides upper thoracic with more stiffness on Rt than Lt  See PROM measurement above Open end feel  PALPATION: Significant tenderness throughout cervical spine facets and soft tissues, spasm present in Rt upper trap                                                                                                                              TREATMENT DATE:  [facility] Adult PT Treatment:   04/05/24 (10 min late for appt) UBE level 1 x 6 min (3/3) - PT present to discuss status 3 way scapular stabilization with light green loop x 10 each side 4D ball rolls x 20 each direction on each UE (blue plyo ball- 2 lbs for right UE and small green spike ball for left UE) Seated wrist extension stretch 3 x 15  sec bilateral  Seated praying hands stretch 3 x 15 sec Seated wrist flexion and extension with 1 lb 2 x10 Seated forearm supination and pronation x 20 with 1 lb Red bar twisting then U and upside down U)x 12  Seated shoulder flexion & scaption 1# DB x 10 each   03/31/24 NuStep Level 4 4 mins- PT present to discuss status 30th visit PN completed see updated cervical ROM, MMT, NDI, ODI, and goals Seated shoulder flexion & scaption with 1# DB x 10 bilateral    03/29/24 NuStep Level 4 4 mins- PT present to discuss status Seated wrist extension stretch 3 x 15  sec bilateral  Seated praying hands stretch 3 x 15 sec Red bar twisting then U and upside down U)x 12  Seated shoulder flexion & scaption 1# DB x 10 each  Seated chin tuck + scap squeeze x 10 Sit to stand with 5# DB x 5 Seated hip abduction with red loop x 20 Supine chin tuck + shoulder flexion x 10 Supine cervical rotation x 10 5 sec hold Gentle manual neck flexion with chin nod and extension isometric for posterior neck elongation    PATIENT EDUCATION: Education details: PT eval findings, anticipated POC, progress with PT, and initial HEP Person educated: Patient Education method: Explanation,  Demonstration, and Handouts Education comprehension: verbalized understanding, returned demonstration, and needs further education  HOME EXERCISE PROGRAM: Access Code: [email or web address] URL:  [email or web address] Date: 03/29/2024 Prepared by: Kristeen Sar  Exercises - Supine Shoulder Flexion with Dowel  - 1 x daily - 7 x weekly - 1-2 sets - 10 reps - Seated Scapular Retraction  - 1 x daily - 7 x weekly - 1 sets - 10 reps - Isometric Shoulder Flexion  - 1 x daily - 7 x weekly - 2 sets - 5 reps - 5 hold - Standing Isometric Shoulder Abduction with Doorway - Arm Bent  - 1 x daily - 7 x weekly - 2 sets - 5 reps - 5 hold - Seated Shoulder Flexion AAROM with Pulley Behind  - 1 x daily - 7 x weekly - 2 sets - 10 reps - Seated Shoulder Scaption AAROM with Pulley at Side  - 1 x daily - 7 x weekly - 2 sets - 10 reps - Standing Hip Abduction with Counter Support  - 1 x daily - 7 x weekly - 2 sets - 10 reps - Standing Hip Extension with Leg Bent and Support  - 1 x daily - 7 x weekly - 2 sets - 10 reps - Clam  - 1 x daily - 7 x weekly - 2 sets - 10 reps - Sidelying Hip Abduction  - 1 x daily - 7 x weekly - 2 sets - 10 reps - Supine Cervical Retraction with Towel  - 1 x daily - 7 x weekly - 2 sets - 5 reps - 5 hold - Supine Isometric Neck Sidebend  - 1 x daily - 7 x weekly - 2 sets - 5 reps - 5 hold - Supine Isometric Neck Rotation  - 1 x daily - 7 x weekly - 2 sets - 5 reps - 5 hold - Seated Hip Abduction with Resistance  - 1 x daily - 7 x weekly - 2 sets - 10 reps - Seated March with Resistance  - 1 x daily - 7 x weekly - 1 sets - 10 reps AQUATIC Access Code: [email or web address] URL: [email or web address] Prepared by: [facility] [address] This aquatic home exercise program from MedBridge utilizes pictures from land based exercises, but has been adapted prior to lamination and issuance.   ASSESSMENT:  CLINICAL IMPRESSION:  Quenna was a little late for her appt today so  treatment time was limited.  She was able to complete all tasks but continues to compensate heavily on left shoulder elevation.  She continues to experience neck pain due to general degenerative changes but this compensatory activity of the left upper trap and levator are likely contributing her her neck pain.  She is very well motivated and compliant.  She should continue to do well.  She would benefit from continuing skilled PT to progress toward final goals.     OBJECTIVE IMPAIRMENTS: Abnormal gait, decreased balance, decreased ROM, decreased strength, hypomobility, increased muscle spasms, impaired flexibility, impaired UE functional use, postural dysfunction, and pain.   ACTIVITY LIMITATIONS: carrying, lifting, bending, squatting, stairs, bathing, dressing, reach over head, and hygiene/grooming, gripping  PARTICIPATION LIMITATIONS: meal prep, cleaning, laundry, interpersonal relationship, driving, shopping, and community activity  PERSONAL FACTORS: Age and 3+ comorbidities: falls risk HTN; anxiety; depression are also affecting patient's functional outcome.   REHAB POTENTIAL: Good  CLINICAL DECISION MAKING: Evolving/moderate complexity  EVALUATION COMPLEXITY: Moderate  GOALS: Goals reviewed with patient? Yes  SHORT TERM GOALS: Target date: 11/22/2023   Patient will be independent with initial HEP. Baseline:  Goal status: MET  2.  Patient will report >  or = to 40% use of Lt UE with light home and self care tasks due to improved ROM and strength. Baseline:  Goal status: partially met  3.  Patient will demonstrate correct seated and standing posture to reduce mechanical strain. Baseline:  Goal status: MET 12/15/23  4.  .  Patient will demonstrate > or = to 100 degrees in Lt  shoulder P/ROM for improved reaching. Baseline:  Goal status: MET 150 deg 11/17/23  5.  Patient to report ability to ambulate for at least 5 min without need to sit.  Baseline:  Goal status: MET  12/15/23  6. Patient to obtain proper assistive device to reduce fall risk  Baseline: not using anything  Goals status: MET 12/15/23    LONG TERM GOALS: Target date: 05/01/24  Patient will demonstrate independence in advanced HEP. Baseline:  Goal status: IN PROGRESS 03/31/2024  2.  .  Patient will report > or = to 70% of normal use of Rt UE for ADLs due to improved functional strength and A/ROM. Baseline:  Goal status: IN PROGRESS (50%) 03/31/2024  3.  Patient will be able to return to return water  aerobics program with no difficulty to establish normal exercise routine Baseline:  Goal status: IN PROGRESS 03/31/24   4.  Patient will be able to wash dishes and fold clothes with < or = to 2/10 left shoulder pain to for improved functional use of extremity. Baseline: washing dishes much improved, folding clothes is painful Goal status MET 03/31/2024   5.  Patient will demonstrate > or = to 115 degrees in Rt shoulder A/ROM for improved reaching overhead. Baseline:  Goal status: IN PROGRESS 03/31/2024  6.  Patient's Annitta will be 64/100 for improved functional use of Lt UE and decreased disability.  Baseline: 52.3/100 Goal status: IN PROGRESS 03/31/2024  7. ODI to improve to 16/50  Baseline: 20/50  Goal status: IN PROGRESS (17/50) 03/31/24  8. Patient to be able to stand or walk for at least 15 min without rest  Baseline:   Goal status:MET (standing) 03/31/2024  9. Patient will achieve Rt grip strength to within 5lb of Lt grip strength  Baseline: 5lb Rt, 15lb Lt  Goal status: IN PROGRESS (7LBS today) 03/31/2024  10. Pt will improve UEFS score to at least 60% to demo improved functional use of dominant UE (right)  Baseline: 37.5%  Goal status: IN PROGRESS 03/31/2024  11. Pt will demo improved neck ROM by at least 10 deg in all planes of motion with min pain to improve reading, driving, cooking.  Baseline: see above on 03/03/24  Goal status:  IN PROGRESS (improved rotation today)  03/31/2024  PLAN: PT FREQUENCY: 1-2x/week  PT DURATION: 8 weeks  PLANNED INTERVENTIONS: 97164- PT Re-evaluation, 97110-Therapeutic exercises, 97530- Therapeutic activity, 97112- Neuromuscular re-education, 97535- Self Care, 02859- Manual therapy, [phone]- Gait training, [phone]- Canalith repositioning, [id number]- Aquatic Therapy, [phone]- Electrical stimulation (unattended), [phone]- Electrical stimulation (manual), [id number]- Vasopneumatic device, [id number]- Ultrasound, [id number]- Ionotophoresis 4mg /ml Dexamethasone , 79439 (1-2 muscles), [id number] (3+ muscles)- Dry Needling, Patient/Family education, Balance training, Stair training, Taping, Joint mobilization, Joint manipulation, Spinal manipulation, Spinal mobilization, Scar mobilization, Vestibular training, Cryotherapy, and Moist heat  PLAN FOR NEXT SESSION: grip strength and shoulder strength; hold DN for now due to financial reasons,postural strength, neck stabilization. For Lt shoulder and trunk/hips: Continue to work on strength, balance, fall prevention, left shoulder ROM and strength and pain control.       Delon B. Mariel Lukins, PT 04/05/24 2:07 PM [facility] [address] [place], [place] [address] Phone # [phone] Fax [phone]    "

## 2024-04-06 ENCOUNTER — Encounter: Payer: Self-pay | Admitting: Cardiology

## 2024-04-07 ENCOUNTER — Ambulatory Visit: Admitting: Physical Therapy

## 2024-04-07 ENCOUNTER — Encounter: Payer: Self-pay | Admitting: Physical Therapy

## 2024-04-07 DIAGNOSIS — R2681 Unsteadiness on feet: Secondary | ICD-10-CM

## 2024-04-07 DIAGNOSIS — G8929 Other chronic pain: Secondary | ICD-10-CM

## 2024-04-07 DIAGNOSIS — M5412 Radiculopathy, cervical region: Secondary | ICD-10-CM

## 2024-04-07 DIAGNOSIS — M542 Cervicalgia: Secondary | ICD-10-CM

## 2024-04-07 DIAGNOSIS — M5459 Other low back pain: Secondary | ICD-10-CM

## 2024-04-07 DIAGNOSIS — M6281 Muscle weakness (generalized): Secondary | ICD-10-CM

## 2024-04-07 NOTE — Therapy (Signed)
 "     OUTPATIENT PHYSICAL THERAPY CERVICAL UPPER EXTREMITY and LUMBAR TREATMENT     Patient Name: Michelle Johnston MRN: [id number] DOB:1950-07-11, 74 y.o., female Today's Date: 04/07/2024  END OF SESSION:  PT End of Session - 04/07/24 0857     Visit Number 32    Number of Visits 38    Date for Recertification  05/02/24    Authorization Type [employer] Approved 12vl 03/21/24-4/202/2026    Authorization Time Period 03/21/24-4/202/2026    Authorization - Visit Number 6    Authorization - Number of Visits 12    Progress Note Due on Visit 40    PT Start Time 0847    PT Stop Time 0930    PT Time Calculation (min) 43 min    Activity Tolerance Patient tolerated treatment well    Behavior During Therapy [facility] for tasks assessed/performed                    Past Medical History:  Diagnosis Date   Anxiety    Arthritis    Atrial fibrillation (HCC)    Bradycardia    Bronchitis    Depression    Dyspnea    GERD (gastroesophageal reflux disease)    Glaucoma    Hypertension    Presence of Watchman left atrial appendage closure device 08/07/2021   Watchman FLX 27mm with Dr. Cindie   Seasonal allergies    Sleep apnea    does not use CPAP   Wears glasses    Past Surgical History:  Procedure Laterality Date   ABDOMINAL HYSTERECTOMY  1990   ATRIAL FIBRILLATION ABLATION N/A 08/12/2020   Procedure: ATRIAL FIBRILLATION ABLATION;  Surgeon: Cindie Ole DASEN, MD;  Location: [facility];  Service: Cardiovascular;  Laterality: N/A;   ATRIAL FIBRILLATION ABLATION N/A 03/26/2023   Procedure: ATRIAL FIBRILLATION ABLATION;  Surgeon: Cindie Ole DASEN, MD;  Location: [facility];  Service: Cardiovascular;  Laterality: N/A;   BREAST BIOPSY Right    benign   CARDIOVERSION N/A 08/15/2019   Procedure: CARDIOVERSION;  Surgeon: Elmira Newman PARAS, MD;  Location: [facility];  Service: Cardiovascular;  Laterality: N/A;   COLONOSCOPY     LEFT ATRIAL APPENDAGE OCCLUSION N/A 08/07/2021    Procedure: LEFT ATRIAL APPENDAGE OCCLUSION;  Surgeon: Cindie Ole DASEN, MD;  Location: [facility];  Service: Cardiovascular;  Laterality: N/A;   meniscus tear knee     left knee   NASAL SINUS SURGERY  1975   ORIF ANKLE FRACTURE  2009   left   ORIF HUMERUS FRACTURE Right 07/15/2023   Procedure: OPEN REDUCTION INTERNAL FIXATION (ORIF) DISTAL HUMERUS FRACTURE;  Surgeon: Cristy Bonner DASEN, MD;  Location: [facility];  Service: Orthopedics;  Laterality: Right;   REVERSE SHOULDER ARTHROPLASTY Left 09/02/2023   Procedure: ARTHROPLASTY, SHOULDER, TOTAL, REVERSE;  Surgeon: Cristy Bonner DASEN, MD;  Location: [facility] OR;  Service: Orthopedics;  Laterality: Left;   TEE WITHOUT CARDIOVERSION N/A 08/07/2021   Procedure: TRANSESOPHAGEAL ECHOCARDIOGRAM (TEE);  Surgeon: Cindie Ole DASEN, MD;  [facility];  Service: Cardiovascular;  Laterality: N/A;   TOTAL KNEE ARTHROPLASTY Left 02/03/2019   Procedure: LEFT TOTAL KNEE ARTHROPLASTY;  Surgeon: Vernetta Lonni GRADE, MD;  Location: [facility];  Service: Orthopedics;  Laterality: Left;   TURBINATE REDUCTION Bilateral 06/04/2014   Procedure: BILATERAL TURBINATE REDUCTION;  Surgeon: Daniel Moccasin, MD;  Location: [facility];  Service: ENT;  Laterality: Bilateral;   TYMPANOSTOMY TUBE PLACEMENT  left   Patient Active Problem List   Diagnosis Date Noted   Conductive hearing loss, bilateral 03/24/2024   Near syncope 09/11/2023   Impacted cerumen of left ear 08/15/2023   Other specified disorders of eustachian tube, left ear 02/06/2023   Central perforation of tympanic membrane of left ear 02/06/2023   Presence of Watchman left atrial appendage closure device 08/07/2021   Atrial fibrillation (HCC) 08/07/2021   OSA (obstructive sleep apnea) 10/29/2020   Persistent atrial fibrillation (HCC) 03/27/2020   Hypercoagulable state due to persistent atrial fibrillation (HCC) 03/27/2020   PAD (peripheral artery disease) 12/27/2019    Exertional chest pain 12/27/2019   Primary osteoarthritis of right knee 07/12/2019   Paroxysmal atrial fibrillation (HCC) 06/16/2019   Nonrheumatic aortic valve insufficiency 06/16/2019   Nonrheumatic mitral valve regurgitation 06/16/2019   Tachycardia 02/20/2019   Abnormal EKG 02/20/2019   Essential hypertension 02/20/2019   Status post total left knee replacement 02/03/2019   Unilateral primary osteoarthritis, left knee 11/30/2016   Chronic pain of left knee 11/30/2016    PCP:   Loreli Kins, MD    REFERRING PROVIDER: Cristy Bonner DASEN, MD/ Margeret Kotyk, MD  REFERRING DIAG: left total shoulder arthoplasty ORIF tuberosities/ lumbar stenosis, cervical radiculopathy - added 03/03/24  THERAPY DIAG:  Radiculopathy, cervical region  Muscle weakness (generalized)  Cervicalgia  Chronic left shoulder pain  Other low back pain  Unsteadiness on feet  Rationale for Evaluation and Treatment: Rehabilitation  ONSET DATE: Surgery 09/02/2023  SUBJECTIVE:                                                                                                                                                                                      SUBJECTIVE STATEMENT:  Patient reports she is doing ok today.  Less neck pain and numness in hand but ongoing elow pain.  Pain reported at 3/10. I would like to do a full body focused session today.  I go to the pool after this too.  POOL ACCESS: GAC - goes to water  aerobics 1-2x/wk  Hand dominance: Right  PERTINENT HISTORY: Anxiety; OA; Depression; HTN; Hx left TKA  PAIN:   PAIN:  04/05/24 Are you having pain? Yes NPRS scale: 3/10 Pain location: Rt elbow, medial forearm, digits 4, 5. Neck. Pain orientation: Right and Medial  PAIN TYPE: sharp and numb, weak Pain description: constant  Aggravating factors: fine motor tasks, sleeping, force through Rt UE like lifting, carrying, pushing Relieving factors: unsure   PRECAUTIONS: Shoulder and Fall  see protocol in media  RED FLAGS:  None   WEIGHT BEARING RESTRICTIONS: No  FALLS:  Has patient fallen in last 6 months? Yes. Number  of falls 2 fall. Patient had two surgeries two weeks from each other due to falling. She fell while she was at the grocery store. She was turning and her husband said her feet did not move with her. PT to address balance   LIVING ENVIRONMENT: Lives with: lives with their spouse Lives in: House/apartment Stairs: Yes: Internal: 12 steps; on left going up Has following equipment at home: Single point cane and Walker - 2 wheeled  OCCUPATION: Retired  PLOF: Independent, Independent with basic ADLs, Independent with household mobility without device, Independent with community mobility without device, Independent with gait, and Independent with transfers  PATIENT GOALS: use of my arm again. To get back to water  aerobics and drive  NEXT MD VISIT:   OBJECTIVE:  Note: Objective measures were completed at Evaluation unless otherwise noted.   PATIENT SURVEYS :  Initial eval QuickDASH Lt : 52.3/100 52.3%  12/15/23: QuickDASH Lt: 34.1/100 34.2%  01/31/24: QuickDASH Lt : 43.2/100 43.2%   12/01/23 ODI: 20/50= 40%  12/15/23 ODI: 21/50= 42%  01/31/24: ODI: 21/50= 42%  02/14/24 ODI 21/50 = 42%  03/03/24: NDI = 21/50, 42% UEFS = 30/80, 37.5%  03/31/2024 ODI 17/50 34% NDI: 11/50 22%  COGNITION: Overall cognitive status: Within functional limits for tasks assessed     SENSATION: 03/03/24: Pt numb to light touch Rt UE digits 4, 5 and medial aspect of arm  WFL  POSTURE: 03/03/24: Rounded shoulders and forward head, increased cervical lordosis with very short posterior neck posture  Rounded shoulder & forward head Standing posture/ gait: right pelvic shift and left trunk lean  NECK ROM: 03/03/24 Flexion  45 with neck pain - central Extension 50 with neck pain - central Rt SB 20 with pain Lt SB 20 with pain Rt Rot 50 - Rt neck pain Lt Rot  40 - Rt neck pain  03/31/2024 Flexion  35 no pain Extension 35 no pain Rt SB 15 no pain Lt SB 23 no pain Rt Rot   - 70 Lt Rot - 70  UPPER EXTREMITY ROM:   Passive ROM Right eval Left eval Left 11/17/23 Left 1015/25 Left 01/31/24 Left 12/15  Shoulder flexion  90 150 160 160 160  Shoulder extension  NT due to restrictions      Shoulder abduction  75 85 120 125 125  Shoulder adduction        Shoulder internal rotation  NT due to restrictions  70 WNL WNL  Shoulder external rotation  NT due to restrictions 22 60 C7 C7  Elbow flexion        Elbow extension        Wrist flexion        Wrist extension        Wrist ulnar deviation        Wrist radial deviation        Wrist pronation        Wrist supination        (Blank rows = not tested)  11/17/23 seated scaption  5 deg, flex 68 deg  NECK RESISTED ISOMETRICS - WEAK AND PAINFUL  UPPER EXTREMITY MMT:  03/31/2024 Rt : abd: 4+/5 ER:3+/5  Grip:7lb Lt : grip 15lb  03/03/24: Rt shoulder: abd 3+/5, ER 3+/5, grip very weak on Rt: Grip Rt: 5lb grip Lt: 15lb  02/14/24: Grip Rt: 5lb grip Lt: 15lb Lt shoulder: 4/5 all muscles except 0/5 ER  NT due to weight restrictions of 1lb  LUMBAR ROM: All WFL  LOWER EXTREMITY  MMT: significant proximal hip weakness, quad 4-/5,  DF 4-/5      01/31/24: right hip flexor 4/5   LOWER EXTREMITY ROM:  FUNCTIONAL TESTING Initial eval: 5STS: 17.47 sec no UE support TUG:14.32 sec  12/01/23: 5STS: 19.78 sec no UE support TUG:12.06 sec SLS: severe trendelenburg but able to hold approx 2-3 sec Tandem stance: needs help to get into position but able to hold 2-3 sec   12/15/23 5STS: 21.96 sec no UE support TUG:16.40 sec with cane  01/31/24 5STS: 21.30 sec with UE support TUG:14.41 sec with cane  03/31/2024 5STS: 17.42 sec with UE support TUG: 11.32 sec no AD device  JOINT MOBILITY TESTING:  03/03/24 Significant joint restriction throughout c-spine U-joint sideglides, upglides in mid-lower  cervical, all glides upper thoracic with more stiffness on Rt than Lt  See PROM measurement above Open end feel  PALPATION: Significant tenderness throughout cervical spine facets and soft tissues, spasm present in Rt upper trap                                                                                                                              TREATMENT DATE:  [facility] Adult PT Treatment:   04/07/24 NuStep L4 x6' Standing at barre: heel raises x10, squats x10, 90/90 alt LE march x10 Seated bil UE flexion 0lb x10, 1lb x10 Seated praying hands stretch 3 x 15 sec Seated yellow band clam 2x5 focusing on single leg at a time Seated hamstring curl red tband x10 each LE LAQ with ball between knees x10 each Tall sitting in chair with 2lb single dumbbell chest press x10, hip to shoulder x10 -  with TA indraw awareness Supine lower trunk rotation x20 Neck and scapular paired retraction 10x3 holds Supine square Lt shoulder 8x CW and CCW Supine bil shoulder extension 90 deg to 0 deg from PT anchoring band above head  x10 Dynaweb grip seated x20 each UE  04/05/24 (10 min late for appt) UBE level 1 x 6 min (3/3) - PT present to discuss status 3 way scapular stabilization with light green loop x 10 each side 4D ball rolls x 20 each direction on each UE (blue plyo ball- 2 lbs for right UE and small green spike ball for left UE) Seated wrist extension stretch 3 x 15  sec bilateral  Seated praying hands stretch 3 x 15 sec Seated wrist flexion and extension with 1 lb 2 x10 Seated forearm supination and pronation x 20 with 1 lb Red bar twisting then U and upside down U)x 12  Seated shoulder flexion & scaption 1# DB x 10 each   03/31/24 NuStep Level 4 4 mins- PT present to discuss status 30th visit PN completed see updated cervical ROM, MMT, NDI, ODI, and goals Seated shoulder flexion & scaption with 1# DB x 10 bilateral   PATIENT EDUCATION: Education details: PT eval findings, anticipated  POC, progress with PT, and initial HEP Person educated: Patient Education  method: Explanation, Demonstration, and Handouts Education comprehension: verbalized understanding, returned demonstration, and needs further education  HOME EXERCISE PROGRAM: Access Code: [email or web address] URL: [email or web address] Date: 03/29/2024 Prepared by: Kristeen Sar  Exercises - Supine Shoulder Flexion with Dowel  - 1 x daily - 7 x weekly - 1-2 sets - 10 reps - Seated Scapular Retraction  - 1 x daily - 7 x weekly - 1 sets - 10 reps - Isometric Shoulder Flexion  - 1 x daily - 7 x weekly - 2 sets - 5 reps - 5 hold - Standing Isometric Shoulder Abduction with Doorway - Arm Bent  - 1 x daily - 7 x weekly - 2 sets - 5 reps - 5 hold - Seated Shoulder Flexion AAROM with Pulley Behind  - 1 x daily - 7 x weekly - 2 sets - 10 reps - Seated Shoulder Scaption AAROM with Pulley at Side  - 1 x daily - 7 x weekly - 2 sets - 10 reps - Standing Hip Abduction with Counter Support  - 1 x daily - 7 x weekly - 2 sets - 10 reps - Standing Hip Extension with Leg Bent and Support  - 1 x daily - 7 x weekly - 2 sets - 10 reps - Clam  - 1 x daily - 7 x weekly - 2 sets - 10 reps - Sidelying Hip Abduction  - 1 x daily - 7 x weekly - 2 sets - 10 reps - Supine Cervical Retraction with Towel  - 1 x daily - 7 x weekly - 2 sets - 5 reps - 5 hold - Supine Isometric Neck Sidebend  - 1 x daily - 7 x weekly - 2 sets - 5 reps - 5 hold - Supine Isometric Neck Rotation  - 1 x daily - 7 x weekly - 2 sets - 5 reps - 5 hold - Seated Hip Abduction with Resistance  - 1 x daily - 7 x weekly - 2 sets - 10 reps - Seated March with Resistance  - 1 x daily - 7 x weekly - 1 sets - 10 reps AQUATIC Access Code: [email or web address] URL: [email or web address] Prepared by: [facility] [address] This aquatic home exercise program from MedBridge utilizes pictures from land based exercises, but has been adapted prior to lamination and  issuance.   ASSESSMENT:  CLINICAL IMPRESSION:  Zamani requested a full body workout today and did well with all tasks.  She did report some Rt knee pain after LAQ.  She continues to use some substitution strategies with upper trap for Lt UE elevation which is expected given RCT and reverse shoulder surgery history.  She needed cues to focus on closed chain hip abductors with standing tasks during which she is quick to fatigue.   OBJECTIVE IMPAIRMENTS: Abnormal gait, decreased balance, decreased ROM, decreased strength, hypomobility, increased muscle spasms, impaired flexibility, impaired UE functional use, postural dysfunction, and pain.   ACTIVITY LIMITATIONS: carrying, lifting, bending, squatting, stairs, bathing, dressing, reach over head, and hygiene/grooming, gripping  PARTICIPATION LIMITATIONS: meal prep, cleaning, laundry, interpersonal relationship, driving, shopping, and community activity  PERSONAL FACTORS: Age and 3+ comorbidities: falls risk HTN; anxiety; depression are also affecting patient's functional outcome.   REHAB POTENTIAL: Good  CLINICAL DECISION MAKING: Evolving/moderate complexity  EVALUATION COMPLEXITY: Moderate  GOALS: Goals reviewed with patient? Yes  SHORT TERM GOALS: Target date: 11/22/2023   Patient will be independent with initial HEP. Baseline:  Goal status: MET  2.  Patient will report >  or = to 40% use of Lt UE with light home and self care tasks due to improved ROM and strength. Baseline:  Goal status: partially met  3.  Patient will demonstrate correct seated and standing posture to reduce mechanical strain. Baseline:  Goal status: MET 12/15/23  4.  .  Patient will demonstrate > or = to 100 degrees in Lt  shoulder P/ROM for improved reaching. Baseline:  Goal status: MET 150 deg 11/17/23  5.  Patient to report ability to ambulate for at least 5 min without need to sit.  Baseline:  Goal status: MET 12/15/23  6. Patient to obtain proper  assistive device to reduce fall risk  Baseline: not using anything  Goals status: MET 12/15/23    LONG TERM GOALS: Target date: 05/01/24  Patient will demonstrate independence in advanced HEP. Baseline:  Goal status: IN PROGRESS 03/31/2024  2.  .  Patient will report > or = to 70% of normal use of Rt UE for ADLs due to improved functional strength and A/ROM. Baseline:  Goal status: IN PROGRESS (50%) 03/31/2024  3.  Patient will be able to return to return water  aerobics program with no difficulty to establish normal exercise routine Baseline:  Goal status: IN PROGRESS 03/31/24   4.  Patient will be able to wash dishes and fold clothes with < or = to 2/10 left shoulder pain to for improved functional use of extremity. Baseline: washing dishes much improved, folding clothes is painful Goal status MET 03/31/2024   5.  Patient will demonstrate > or = to 115 degrees in Rt shoulder A/ROM for improved reaching overhead. Baseline:  Goal status: IN PROGRESS 03/31/2024  6.  Patient's Annitta will be 64/100 for improved functional use of Lt UE and decreased disability.  Baseline: 52.3/100 Goal status: IN PROGRESS 03/31/2024  7. ODI to improve to 16/50  Baseline: 20/50  Goal status: IN PROGRESS (17/50) 03/31/24  8. Patient to be able to stand or walk for at least 15 min without rest  Baseline:   Goal status:MET (standing) 03/31/2024  9. Patient will achieve Rt grip strength to within 5lb of Lt grip strength  Baseline: 5lb Rt, 15lb Lt  Goal status: IN PROGRESS (7LBS today) 03/31/2024  10. Pt will improve UEFS score to at least 60% to demo improved functional use of dominant UE (right)  Baseline: 37.5%  Goal status: IN PROGRESS 03/31/2024  11. Pt will demo improved neck ROM by at least 10 deg in all planes of motion with min pain to improve reading, driving, cooking.  Baseline: see above on 03/03/24  Goal status:  IN PROGRESS (improved rotation today) 03/31/2024  PLAN: PT FREQUENCY:  1-2x/week  PT DURATION: 8 weeks  PLANNED INTERVENTIONS: 97164- PT Re-evaluation, 97110-Therapeutic exercises, 97530- Therapeutic activity, 97112- Neuromuscular re-education, 97535- Self Care, 02859- Manual therapy, [phone]- Gait training, [phone]- Canalith repositioning, [id number]- Aquatic Therapy, [phone]- Electrical stimulation (unattended), [phone]- Electrical stimulation (manual), [id number]- Vasopneumatic device, [id number]- Ultrasound, [id number]- Ionotophoresis 4mg /ml Dexamethasone , 79439 (1-2 muscles), 20561 (3+ muscles)- Dry Needling, Patient/Family education, Balance training, Stair training, Taping, Joint mobilization, Joint manipulation, Spinal manipulation, Spinal mobilization, Scar mobilization, Vestibular training, Cryotherapy, and Moist heat  PLAN FOR NEXT SESSION: grip strength and shoulder strength; hold DN for now due to financial reasons,postural strength, neck stabilization. For Lt shoulder and trunk/hips: Continue to work on strength, balance, fall prevention, left shoulder ROM and strength and pain control.       Orvil Jonesha Tsuchiya, PT 04/07/24 9:31 AM  [facility] [address] [place], [place] [address] Phone # [phone] Fax [phone]    "

## 2024-04-12 ENCOUNTER — Ambulatory Visit

## 2024-04-14 ENCOUNTER — Ambulatory Visit: Admitting: Physical Therapy

## 2024-04-19 ENCOUNTER — Ambulatory Visit: Admitting: Physical Therapy

## 2024-04-21 ENCOUNTER — Ambulatory Visit: Admitting: Physical Therapy

## 2024-04-26 ENCOUNTER — Ambulatory Visit (HOSPITAL_BASED_OUTPATIENT_CLINIC_OR_DEPARTMENT_OTHER)

## 2024-09-08 ENCOUNTER — Ambulatory Visit (HOSPITAL_COMMUNITY)

## 2024-09-28 ENCOUNTER — Ambulatory Visit (INDEPENDENT_AMBULATORY_CARE_PROVIDER_SITE_OTHER): Admitting: Otolaryngology
